# Patient Record
Sex: Female | Born: 1940 | Race: Black or African American | Hispanic: No | Smoking: Never smoker
Health system: Southern US, Community
[De-identification: ages and names within clinical notes are randomized; demographics above are authoritative.]

## PROBLEM LIST (undated history)

## (undated) ENCOUNTER — Emergency Department (HOSPITAL_COMMUNITY): Admission: EM | Discharge status: Absent without leave | Payer: Medicare HMO

## (undated) DIAGNOSIS — A879 Viral meningitis, unspecified: Secondary | ICD-10-CM

## (undated) DIAGNOSIS — K625 Hemorrhage of anus and rectum: Secondary | ICD-10-CM

## (undated) DIAGNOSIS — M1990 Unspecified osteoarthritis, unspecified site: Secondary | ICD-10-CM

## (undated) DIAGNOSIS — E785 Hyperlipidemia, unspecified: Secondary | ICD-10-CM

## (undated) DIAGNOSIS — Z973 Presence of spectacles and contact lenses: Secondary | ICD-10-CM

## (undated) DIAGNOSIS — B004 Herpesviral encephalitis: Secondary | ICD-10-CM

## (undated) DIAGNOSIS — Z780 Asymptomatic menopausal state: Secondary | ICD-10-CM

## (undated) DIAGNOSIS — E669 Obesity, unspecified: Secondary | ICD-10-CM

## (undated) DIAGNOSIS — R5382 Chronic fatigue, unspecified: Secondary | ICD-10-CM

## (undated) DIAGNOSIS — I10 Essential (primary) hypertension: Secondary | ICD-10-CM

## (undated) DIAGNOSIS — N6099 Unspecified benign mammary dysplasia of unspecified breast: Secondary | ICD-10-CM

## (undated) HISTORY — DX: Asymptomatic menopausal state: Z78.0

## (undated) HISTORY — DX: Hemorrhage of anus and rectum: K62.5

## (undated) HISTORY — PX: CYST EXCISION: SHX5701

## (undated) HISTORY — PX: TUBAL LIGATION: SHX77

## (undated) HISTORY — PX: BREAST EXCISIONAL BIOPSY: SUR124

## (undated) HISTORY — DX: Unspecified osteoarthritis, unspecified site: M19.90

## (undated) HISTORY — PX: OTHER SURGICAL HISTORY: SHX169

## (undated) HISTORY — DX: Essential (primary) hypertension: I10

## (undated) HISTORY — PX: APPENDECTOMY: SHX54

## (undated) HISTORY — DX: Unspecified benign mammary dysplasia of unspecified breast: N60.99

## (undated) HISTORY — DX: Hyperlipidemia, unspecified: E78.5

## (undated) HISTORY — DX: Herpesviral encephalitis: B00.4

## (undated) HISTORY — DX: Obesity, unspecified: E66.9

---

## 1990-09-22 HISTORY — PX: ABDOMINAL HYSTERECTOMY: SHX81

## 2001-02-17 ENCOUNTER — Ambulatory Visit (HOSPITAL_COMMUNITY): Admission: RE | Admit: 2001-02-17 | Discharge: 2001-02-17 | Payer: Self-pay | Admitting: General Surgery

## 2001-11-17 ENCOUNTER — Other Ambulatory Visit: Admission: RE | Admit: 2001-11-17 | Discharge: 2001-11-17 | Payer: Self-pay | Admitting: Family Medicine

## 2001-12-01 ENCOUNTER — Ambulatory Visit (HOSPITAL_COMMUNITY): Admission: RE | Admit: 2001-12-01 | Discharge: 2001-12-01 | Payer: Self-pay | Admitting: General Surgery

## 2002-01-13 ENCOUNTER — Encounter: Payer: Self-pay | Admitting: Family Medicine

## 2002-01-13 ENCOUNTER — Ambulatory Visit (HOSPITAL_COMMUNITY): Admission: RE | Admit: 2002-01-13 | Discharge: 2002-01-13 | Payer: Self-pay | Admitting: Family Medicine

## 2002-01-19 ENCOUNTER — Ambulatory Visit (HOSPITAL_COMMUNITY): Admission: RE | Admit: 2002-01-19 | Discharge: 2002-01-19 | Payer: Self-pay | Admitting: Family Medicine

## 2002-01-19 ENCOUNTER — Encounter: Payer: Self-pay | Admitting: Family Medicine

## 2002-03-29 ENCOUNTER — Other Ambulatory Visit: Admission: RE | Admit: 2002-03-29 | Discharge: 2002-03-29 | Payer: Self-pay | Admitting: General Surgery

## 2003-06-12 ENCOUNTER — Encounter: Payer: Self-pay | Admitting: Cardiology

## 2003-06-12 ENCOUNTER — Ambulatory Visit (HOSPITAL_COMMUNITY): Admission: RE | Admit: 2003-06-12 | Discharge: 2003-06-12 | Payer: Self-pay | Admitting: Cardiology

## 2003-09-12 ENCOUNTER — Ambulatory Visit (HOSPITAL_COMMUNITY): Admission: RE | Admit: 2003-09-12 | Discharge: 2003-09-12 | Payer: Self-pay | Admitting: General Surgery

## 2003-09-20 ENCOUNTER — Ambulatory Visit (HOSPITAL_COMMUNITY): Admission: RE | Admit: 2003-09-20 | Discharge: 2003-09-20 | Payer: Self-pay | Admitting: General Surgery

## 2003-09-23 DIAGNOSIS — I10 Essential (primary) hypertension: Secondary | ICD-10-CM

## 2003-09-23 HISTORY — DX: Essential (primary) hypertension: I10

## 2004-05-15 ENCOUNTER — Ambulatory Visit (HOSPITAL_COMMUNITY): Admission: RE | Admit: 2004-05-15 | Discharge: 2004-05-15 | Payer: Self-pay | Admitting: Family Medicine

## 2004-08-06 ENCOUNTER — Ambulatory Visit: Payer: Self-pay | Admitting: Family Medicine

## 2004-08-21 ENCOUNTER — Ambulatory Visit: Payer: Self-pay | Admitting: Family Medicine

## 2004-09-25 ENCOUNTER — Ambulatory Visit (HOSPITAL_COMMUNITY): Admission: RE | Admit: 2004-09-25 | Discharge: 2004-09-25 | Payer: Self-pay | Admitting: Family Medicine

## 2004-11-11 ENCOUNTER — Emergency Department (HOSPITAL_COMMUNITY): Admission: EM | Admit: 2004-11-11 | Discharge: 2004-11-11 | Payer: Self-pay | Admitting: *Deleted

## 2004-12-19 ENCOUNTER — Ambulatory Visit: Payer: Self-pay | Admitting: Family Medicine

## 2005-04-14 ENCOUNTER — Ambulatory Visit: Payer: Self-pay | Admitting: Family Medicine

## 2005-07-02 ENCOUNTER — Ambulatory Visit: Payer: Self-pay | Admitting: Family Medicine

## 2006-02-06 ENCOUNTER — Emergency Department (HOSPITAL_COMMUNITY): Admission: EM | Admit: 2006-02-06 | Discharge: 2006-02-06 | Payer: Self-pay | Admitting: Emergency Medicine

## 2006-04-03 ENCOUNTER — Other Ambulatory Visit: Admission: RE | Admit: 2006-04-03 | Discharge: 2006-04-03 | Payer: Self-pay | Admitting: Family Medicine

## 2006-04-03 ENCOUNTER — Encounter: Payer: Self-pay | Admitting: Family Medicine

## 2006-04-03 ENCOUNTER — Ambulatory Visit: Payer: Self-pay | Admitting: Family Medicine

## 2006-04-10 ENCOUNTER — Ambulatory Visit (HOSPITAL_COMMUNITY): Admission: RE | Admit: 2006-04-10 | Discharge: 2006-04-10 | Payer: Self-pay | Admitting: Family Medicine

## 2006-06-10 ENCOUNTER — Emergency Department (HOSPITAL_COMMUNITY): Admission: EM | Admit: 2006-06-10 | Discharge: 2006-06-10 | Payer: Self-pay | Admitting: Emergency Medicine

## 2006-10-23 ENCOUNTER — Ambulatory Visit: Payer: Self-pay | Admitting: Family Medicine

## 2006-11-02 ENCOUNTER — Ambulatory Visit (HOSPITAL_COMMUNITY): Admission: RE | Admit: 2006-11-02 | Discharge: 2006-11-02 | Payer: Self-pay | Admitting: Family Medicine

## 2006-11-09 ENCOUNTER — Emergency Department (HOSPITAL_COMMUNITY): Admission: EM | Admit: 2006-11-09 | Discharge: 2006-11-09 | Payer: Self-pay | Admitting: Emergency Medicine

## 2006-11-12 ENCOUNTER — Ambulatory Visit (HOSPITAL_COMMUNITY): Admission: RE | Admit: 2006-11-12 | Discharge: 2006-11-12 | Payer: Self-pay | Admitting: Family Medicine

## 2006-11-13 ENCOUNTER — Ambulatory Visit: Payer: Self-pay | Admitting: Family Medicine

## 2006-11-30 ENCOUNTER — Encounter: Admission: RE | Admit: 2006-11-30 | Discharge: 2006-11-30 | Payer: Self-pay | Admitting: Family Medicine

## 2006-12-14 ENCOUNTER — Encounter: Admission: RE | Admit: 2006-12-14 | Discharge: 2006-12-14 | Payer: Self-pay | Admitting: Family Medicine

## 2007-01-01 ENCOUNTER — Encounter: Admission: RE | Admit: 2007-01-01 | Discharge: 2007-01-01 | Payer: Self-pay | Admitting: Family Medicine

## 2007-02-11 ENCOUNTER — Ambulatory Visit: Payer: Self-pay | Admitting: Family Medicine

## 2007-02-19 ENCOUNTER — Ambulatory Visit (HOSPITAL_COMMUNITY): Admission: RE | Admit: 2007-02-19 | Discharge: 2007-02-19 | Payer: Self-pay | Admitting: Family Medicine

## 2007-04-05 ENCOUNTER — Other Ambulatory Visit: Admission: RE | Admit: 2007-04-05 | Discharge: 2007-04-05 | Payer: Self-pay | Admitting: Family Medicine

## 2007-04-05 ENCOUNTER — Encounter: Payer: Self-pay | Admitting: Family Medicine

## 2007-04-05 ENCOUNTER — Ambulatory Visit: Payer: Self-pay | Admitting: Family Medicine

## 2007-04-06 ENCOUNTER — Encounter: Payer: Self-pay | Admitting: Family Medicine

## 2007-04-06 LAB — CONVERTED CEMR LAB
Basophils Absolute: 0 10*3/uL (ref 0.0–0.1)
CO2: 25 meq/L (ref 19–32)
Calcium: 9.8 mg/dL (ref 8.4–10.5)
Chloride: 107 meq/L (ref 96–112)
HDL: 48 mg/dL (ref >39)
LDL Cholesterol: 144 mg/dL — ABNORMAL HIGH (ref 0–99)
Lymphocytes Relative: 40 % (ref 12–46)
Lymphs Abs: 2.2 10*3/uL (ref 0.7–3.3)
Neutro Abs: 2.7 10*3/uL (ref 1.7–7.7)
Neutrophils Relative %: 50 % (ref 43–77)
Platelets: 195 10*3/uL (ref 150–400)
Potassium: 5.1 meq/L (ref 3.5–5.3)
RDW: 13 % (ref 11.5–14.0)
Sodium: 141 meq/L (ref 135–145)
TSH: 1.04 microintl units/mL (ref 0.350–5.50)
Total CHOL/HDL Ratio: 4.4
VLDL: 21 mg/dL (ref 0–40)
WBC: 5.4 10*3/uL (ref 4.0–10.5)

## 2007-04-28 ENCOUNTER — Ambulatory Visit (HOSPITAL_COMMUNITY): Admission: RE | Admit: 2007-04-28 | Discharge: 2007-04-28 | Payer: Self-pay | Admitting: Family Medicine

## 2007-07-02 ENCOUNTER — Ambulatory Visit: Payer: Self-pay | Admitting: Family Medicine

## 2007-10-04 ENCOUNTER — Ambulatory Visit: Payer: Self-pay | Admitting: Family Medicine

## 2007-10-04 LAB — CONVERTED CEMR LAB
ALT: 14 units/L (ref 0–35)
AST: 17 units/L (ref 0–37)
Albumin: 4.3 g/dL (ref 3.5–5.2)
Alkaline Phosphatase: 86 units/L (ref 39–117)
Blood Glucose, Fasting: 99 mg/dL
CO2: 23 meq/L (ref 19–32)
Calcium: 9.6 mg/dL (ref 8.4–10.5)
Cholesterol: 197 mg/dL (ref 0–200)
Creatinine, Ser: 0.9 mg/dL (ref 0.40–1.20)
HDL: 48 mg/dL (ref >39)
Total Bilirubin: 0.4 mg/dL (ref 0.3–1.2)

## 2007-10-12 ENCOUNTER — Encounter: Payer: Self-pay | Admitting: Family Medicine

## 2007-10-12 DIAGNOSIS — I10 Essential (primary) hypertension: Secondary | ICD-10-CM

## 2007-10-12 DIAGNOSIS — N959 Unspecified menopausal and perimenopausal disorder: Secondary | ICD-10-CM | POA: Insufficient documentation

## 2007-10-12 DIAGNOSIS — M479 Spondylosis, unspecified: Secondary | ICD-10-CM | POA: Insufficient documentation

## 2007-10-12 DIAGNOSIS — E785 Hyperlipidemia, unspecified: Secondary | ICD-10-CM | POA: Insufficient documentation

## 2007-10-12 DIAGNOSIS — E669 Obesity, unspecified: Secondary | ICD-10-CM

## 2007-11-09 ENCOUNTER — Inpatient Hospital Stay (HOSPITAL_COMMUNITY): Admission: EM | Admit: 2007-11-09 | Discharge: 2007-11-10 | Payer: Self-pay | Admitting: Emergency Medicine

## 2007-11-17 ENCOUNTER — Ambulatory Visit (HOSPITAL_COMMUNITY): Admission: RE | Admit: 2007-11-17 | Discharge: 2007-11-17 | Payer: Self-pay | Admitting: Family Medicine

## 2007-12-01 ENCOUNTER — Encounter: Admission: RE | Admit: 2007-12-01 | Discharge: 2007-12-01 | Payer: Self-pay | Admitting: Family Medicine

## 2007-12-02 ENCOUNTER — Ambulatory Visit: Payer: Self-pay | Admitting: Family Medicine

## 2008-04-04 ENCOUNTER — Other Ambulatory Visit: Admission: RE | Admit: 2008-04-04 | Discharge: 2008-04-04 | Payer: Self-pay | Admitting: Family Medicine

## 2008-04-04 ENCOUNTER — Ambulatory Visit: Payer: Self-pay | Admitting: Family Medicine

## 2008-04-04 ENCOUNTER — Encounter: Payer: Self-pay | Admitting: Family Medicine

## 2008-04-04 LAB — CONVERTED CEMR LAB: Pap Smear: NORMAL

## 2008-04-05 ENCOUNTER — Encounter: Payer: Self-pay | Admitting: Family Medicine

## 2008-04-05 LAB — CONVERTED CEMR LAB
AST: 17 units/L (ref 0–37)
Albumin: 4.1 g/dL (ref 3.5–5.2)
Alkaline Phosphatase: 84 units/L (ref 39–117)
HDL: 44 mg/dL (ref >39)
LDL Cholesterol: 62 mg/dL (ref 0–99)
Total Protein: 7.3 g/dL (ref 6.0–8.3)
Triglycerides: 80 mg/dL (ref <150)

## 2008-06-05 ENCOUNTER — Encounter: Payer: Self-pay | Admitting: Family Medicine

## 2008-06-05 ENCOUNTER — Ambulatory Visit (HOSPITAL_COMMUNITY): Admission: RE | Admit: 2008-06-05 | Discharge: 2008-06-05 | Payer: Self-pay | Admitting: Family Medicine

## 2008-06-06 ENCOUNTER — Encounter: Payer: Self-pay | Admitting: Family Medicine

## 2008-08-10 ENCOUNTER — Ambulatory Visit: Payer: Self-pay | Admitting: Family Medicine

## 2008-08-10 LAB — CONVERTED CEMR LAB
AST: 18 units/L (ref 0–37)
Albumin: 4.6 g/dL (ref 3.5–5.2)
Alkaline Phosphatase: 91 units/L (ref 39–117)
Calcium: 9.5 mg/dL (ref 8.4–10.5)
Cholesterol: 125 mg/dL (ref 0–200)
Creatinine, Ser: 0.95 mg/dL (ref 0.40–1.20)
HDL: 44 mg/dL (ref >39)
Total Protein: 7.2 g/dL (ref 6.0–8.3)
Triglycerides: 80 mg/dL (ref <150)

## 2008-09-25 ENCOUNTER — Telehealth: Payer: Self-pay | Admitting: Family Medicine

## 2008-10-16 ENCOUNTER — Ambulatory Visit: Payer: Self-pay | Admitting: Family Medicine

## 2009-04-20 ENCOUNTER — Ambulatory Visit: Payer: Self-pay | Admitting: Family Medicine

## 2009-04-20 ENCOUNTER — Encounter: Payer: Self-pay | Admitting: Family Medicine

## 2009-04-20 ENCOUNTER — Other Ambulatory Visit: Admission: RE | Admit: 2009-04-20 | Discharge: 2009-04-20 | Payer: Self-pay | Admitting: Family Medicine

## 2009-04-23 LAB — CONVERTED CEMR LAB
Albumin: 4.3 g/dL (ref 3.5–5.2)
Basophils Relative: 0 % (ref 0–1)
Bilirubin, Direct: 0.1 mg/dL (ref 0.0–0.3)
CO2: 23 meq/L (ref 19–32)
Chloride: 108 meq/L (ref 96–112)
Creatinine, Ser: 0.98 mg/dL (ref 0.40–1.20)
Eosinophils Absolute: 0.1 10*3/uL (ref 0.0–0.7)
Eosinophils Relative: 1 % (ref 0–5)
Glucose, Bld: 106 mg/dL — ABNORMAL HIGH (ref 70–99)
HCT: 40.4 % (ref 36.0–46.0)
HDL: 40 mg/dL (ref >39)
LDL Cholesterol: 59 mg/dL (ref 0–99)
Lymphocytes Relative: 38 % (ref 12–46)
Neutro Abs: 3.1 10*3/uL (ref 1.7–7.7)
Platelets: 187 10*3/uL (ref 150–400)
RDW: 13.7 % (ref 11.5–15.5)
Sodium: 143 meq/L (ref 135–145)
TSH: 0.91 microintl units/mL (ref 0.350–4.500)
Total Bilirubin: 0.5 mg/dL (ref 0.3–1.2)
Total CHOL/HDL Ratio: 3

## 2009-05-21 ENCOUNTER — Telehealth: Payer: Self-pay | Admitting: Family Medicine

## 2009-06-08 ENCOUNTER — Encounter: Payer: Self-pay | Admitting: Family Medicine

## 2009-06-08 ENCOUNTER — Ambulatory Visit (HOSPITAL_COMMUNITY): Admission: RE | Admit: 2009-06-08 | Discharge: 2009-06-08 | Payer: Self-pay | Admitting: Family Medicine

## 2009-06-14 ENCOUNTER — Ambulatory Visit: Payer: Self-pay | Admitting: Family Medicine

## 2009-08-16 ENCOUNTER — Emergency Department (HOSPITAL_COMMUNITY): Admission: EM | Admit: 2009-08-16 | Discharge: 2009-08-16 | Payer: Self-pay | Admitting: Emergency Medicine

## 2009-10-24 ENCOUNTER — Telehealth: Payer: Self-pay | Admitting: Family Medicine

## 2009-11-28 ENCOUNTER — Ambulatory Visit: Payer: Self-pay | Admitting: Family Medicine

## 2009-11-28 DIAGNOSIS — R7301 Impaired fasting glucose: Secondary | ICD-10-CM | POA: Insufficient documentation

## 2009-11-28 DIAGNOSIS — G4700 Insomnia, unspecified: Secondary | ICD-10-CM | POA: Insufficient documentation

## 2009-11-29 LAB — CONVERTED CEMR LAB
Albumin: 4.4 g/dL (ref 3.5–5.2)
Alkaline Phosphatase: 78 units/L (ref 39–117)
CO2: 23 meq/L (ref 19–32)
Chloride: 105 meq/L (ref 96–112)
HDL: 40 mg/dL (ref >39)
Hgb A1c MFr Bld: 7.1 % — ABNORMAL HIGH (ref 4.6–6.1)
LDL Cholesterol: 54 mg/dL (ref 0–99)
Potassium: 4.3 meq/L (ref 3.5–5.3)
Total CHOL/HDL Ratio: 2.8
Total Protein: 7.3 g/dL (ref 6.0–8.3)
Triglycerides: 78 mg/dL (ref <150)
VLDL: 16 mg/dL (ref 0–40)
Vit D, 25-Hydroxy: 30 ng/mL (ref 30–89)

## 2009-12-25 ENCOUNTER — Encounter: Payer: Self-pay | Admitting: Family Medicine

## 2010-01-08 ENCOUNTER — Ambulatory Visit: Payer: Self-pay | Admitting: Family Medicine

## 2010-01-08 DIAGNOSIS — E1169 Type 2 diabetes mellitus with other specified complication: Secondary | ICD-10-CM | POA: Insufficient documentation

## 2010-01-08 DIAGNOSIS — E1121 Type 2 diabetes mellitus with diabetic nephropathy: Secondary | ICD-10-CM | POA: Insufficient documentation

## 2010-01-08 DIAGNOSIS — N63 Unspecified lump in unspecified breast: Secondary | ICD-10-CM

## 2010-01-08 DIAGNOSIS — E1159 Type 2 diabetes mellitus with other circulatory complications: Secondary | ICD-10-CM

## 2010-01-08 DIAGNOSIS — R5381 Other malaise: Secondary | ICD-10-CM

## 2010-01-08 DIAGNOSIS — R5383 Other fatigue: Secondary | ICD-10-CM

## 2010-01-09 ENCOUNTER — Encounter: Payer: Self-pay | Admitting: Family Medicine

## 2010-01-09 ENCOUNTER — Ambulatory Visit (HOSPITAL_COMMUNITY): Admission: RE | Admit: 2010-01-09 | Discharge: 2010-01-09 | Payer: Self-pay | Admitting: Family Medicine

## 2010-01-09 LAB — CONVERTED CEMR LAB: Microalb, Ur: 1.09 mg/dL (ref 0.00–1.89)

## 2010-01-29 ENCOUNTER — Telehealth: Payer: Self-pay | Admitting: Physician Assistant

## 2010-03-12 ENCOUNTER — Encounter: Payer: Self-pay | Admitting: Family Medicine

## 2010-03-13 LAB — CONVERTED CEMR LAB
BUN: 13 mg/dL (ref 6–23)
Creatinine, Ser: 0.9 mg/dL (ref 0.40–1.20)
Glucose, Bld: 108 mg/dL — ABNORMAL HIGH (ref 70–99)
Hgb A1c MFr Bld: 6.6 % — ABNORMAL HIGH (ref <5.7)
Potassium: 4.8 meq/L (ref 3.5–5.3)

## 2010-03-14 ENCOUNTER — Ambulatory Visit: Payer: Self-pay | Admitting: Family Medicine

## 2010-04-30 ENCOUNTER — Ambulatory Visit: Payer: Self-pay | Admitting: Family Medicine

## 2010-06-04 ENCOUNTER — Ambulatory Visit (HOSPITAL_COMMUNITY): Admission: RE | Admit: 2010-06-04 | Discharge: 2010-06-04 | Payer: Self-pay | Admitting: Family Medicine

## 2010-06-04 ENCOUNTER — Ambulatory Visit: Payer: Self-pay | Admitting: Family Medicine

## 2010-06-04 DIAGNOSIS — R1084 Generalized abdominal pain: Secondary | ICD-10-CM

## 2010-06-04 LAB — CONVERTED CEMR LAB
Bilirubin Urine: NEGATIVE
Nitrite: NEGATIVE
pH: 5.5

## 2010-06-06 ENCOUNTER — Telehealth: Payer: Self-pay | Admitting: Family Medicine

## 2010-07-25 ENCOUNTER — Ambulatory Visit (HOSPITAL_COMMUNITY): Admission: RE | Admit: 2010-07-25 | Discharge: 2010-07-25 | Payer: Self-pay | Admitting: Family Medicine

## 2010-08-27 ENCOUNTER — Encounter: Payer: Self-pay | Admitting: Family Medicine

## 2010-09-05 LAB — CONVERTED CEMR LAB
AST: 21 units/L (ref 0–37)
BUN: 12 mg/dL (ref 6–23)
CO2: 26 meq/L (ref 19–32)
Calcium: 9.2 mg/dL (ref 8.4–10.5)
Chloride: 104 meq/L (ref 96–112)
Cholesterol: 166 mg/dL (ref 0–200)
Creatinine, Ser: 0.86 mg/dL (ref 0.40–1.20)
HDL: 42 mg/dL (ref >39)
Total CHOL/HDL Ratio: 4

## 2010-09-09 ENCOUNTER — Ambulatory Visit: Payer: Self-pay | Admitting: Family Medicine

## 2010-09-09 LAB — HM DIABETES FOOT EXAM

## 2010-10-13 ENCOUNTER — Encounter: Payer: Self-pay | Admitting: Family Medicine

## 2010-10-22 NOTE — Assessment & Plan Note (Signed)
Summary: F UP   Vital Signs:  Patient profile:   70 year old female Menstrual status:  hysterectomy Height:      64 inches Weight:      197.25 pounds BMI:     33.98 O2 Sat:      97 % Pulse rate:   72 / minute Pulse rhythm:   regular Resp:     16 per minute BP sitting:   112 / 78  (left arm) Cuff size:   large  Vitals Entered By: Everitt Amber LPN (March 14, 2010 10:05 AM)  Nutrition Counseling: Patient's BMI is greater than 25 and therefore counseled on weight management options. CC: Follow up chronic problems   CC:  Follow up chronic problems.  History of Present Illness: Reports  that she has been  doing well. Denies recent fever or chills. Denies sinus pressure, nasal congestion , ear pain or sore throat. Denies chest congestion, or cough productive of sputum. Denies chest pain, palpitations, PND, orthopnea or leg swelling. Denies abdominal pain, nausea, vomitting, diarrhea or constipation. Denies change in bowel movements or bloody stool. Denies dysuria , frequency, incontinence or hesitancy. Denies  joint pain, swelling, or reduced mobility. Denies headaches, vertigo, seizures. Denies depression, anxiety or insomnia. Denies  rash, lesions, or itch.    .hpi Current Medications (verified): 1)  Catapres 0.1 Mg  Tabs (Clonidine Hcl) .... Take 1 Tab By Mouth At Bedtime 2)  Crestor 10 Mg Tabs (Rosuvastatin Calcium) .... One Tab By Mouth Qhs 3)  Oscal 500/200 D-3 500-200 Mg-Unit Tabs (Calcium-Vitamin D) .... Take 1 Tablet By Mouth Once A Day 4)  Ecotrin Low Strength 81 Mg Tbec (Aspirin) .... Take 1 Tablet By Mouth Once A Day 5)  Restoril 15 Mg Caps (Temazepam) .... One To Two Cpsules At Bedtime 6)  Metformin Hcl 500 Mg Tabs (Metformin Hcl) .... Take 1 Tablet By Mouth Two Times A Day  Allergies (verified): 1)  ! Darvocet  Review of Systems      See HPI Eyes:  Denies blurring and discharge. Endo:  Denies cold intolerance, excessive hunger, excessive thirst, excessive  urination, heat intolerance, polyuria, and weight change; fasting blood sugars are being tested regularly abnd are less than 120. Heme:  Denies abnormal bruising and bleeding. Allergy:  Denies hives or rash and itching eyes.  Physical Exam  General:  Well-developed,well-nourished,in no acute distress; alert,appropriate and cooperative throughout examination HEENT: No facial asymmetry,  EOMI, No sinus tenderness, TM's Clear, oropharynx  pink and moist.   Chest: Clear to auscultation bilaterally.  CVS: S1, S2, No murmurs, No S3.   Abd: Soft, Nontender.  MS: Adequate ROM spine, hips, shoulders and knees.  Ext: No edema.   CNS: CN 2-12 intact, power tone and sensation normal throughout.   Skin: Intact, no visible lesions or rashes.  Psych: Good eye contact, normal affect.  Memory intact, not anxious or depressed appearing.    Impression & Recommendations:  Problem # 1:  DIABETES MELLITUS, TYPE II (ICD-250.00) Assessment Improved  Her updated medication list for this problem includes:    Ecotrin Low Strength 81 Mg Tbec (Aspirin) .Marland Kitchen... Take 1 tablet by mouth once a day    Metformin Hcl 500 Mg Tabs (Metformin hcl) .Marland Kitchen... Take 1 tablet by mouth two times a day  Labs Reviewed: Creat: 0.90 (03/12/2010)    Reviewed HgBA1c results: 6.6 (03/12/2010)  7.1 (11/28/2009)  Problem # 2:  ESSENTIAL HYPERTENSION, BENIGN (ICD-401.1) Assessment: Improved  Her updated medication list for this problem  includes:    Catapres 0.1 Mg Tabs (Clonidine hcl) .Marland Kitchen... Take 1 tab by mouth at bedtime  Orders: T-Basic Metabolic Panel [id number])  BP today: 112/78 Prior BP: 118/84 (01/08/2010)  Labs Reviewed: K+: 4.8 (03/12/2010) Creat: : 0.90 (03/12/2010)   Chol: 110 (11/28/2009)   HDL: 40 (11/28/2009)   LDL: 54 (11/28/2009)   TG: 78 (11/28/2009)  Problem # 3:  OBESITY, MILD (ICD-278.02) Assessment: Unchanged  Ht: 64 (03/14/2010)   Wt: 197.25 (03/14/2010)   BMI: 33.98 (03/14/2010)  Problem # 4:   DYSLIPIDEMIA (ICD-272.4) Assessment: Comment Only  Her updated medication list for this problem includes:    Crestor 10 Mg Tabs (Rosuvastatin calcium) ..... One tab by mouth qhs  Labs Reviewed: SGOT: 18 (11/28/2009)   SGPT: 15 (11/28/2009)   HDL:40 (11/28/2009), 40 (04/20/2009)  LDL:54 (11/28/2009), 59 (04/20/2009)  Chol:110 (11/28/2009), 120 (04/20/2009)  Trig:78 (11/28/2009), 103 (04/20/2009)  Complete Medication List: 1)  Catapres 0.1 Mg Tabs (Clonidine hcl) .... Take 1 tab by mouth at bedtime 2)  Crestor 10 Mg Tabs (Rosuvastatin calcium) .... One tab by mouth qhs 3)  Oscal 500/200 D-3 500-200 Mg-unit Tabs (Calcium-vitamin d) .... Take 1 tablet by mouth once a day 4)  Ecotrin Low Strength 81 Mg Tbec (Aspirin) .... Take 1 tablet by mouth once a day 5)  Restoril 15 Mg Caps (Temazepam) .... One to two cpsules at bedtime 6)  Metformin Hcl 500 Mg Tabs (Metformin hcl) .... Take 1 tablet by mouth two times a day  Other Orders: T-Hepatic Function [id number]) T-Lipid Profile [id number]) T- Hemoglobin A1C ([id number])  Patient Instructions: 1)  Please schedule a follow-up appointment in 3.5 months. 2)  It is important that you exercise regularly at least 30 minutes 5 times a week. If you develop chest pain, have severe difficulty breathing, or feel very tired , stop exercising immediately and seek medical attention. 3)  You need to lose weight. Consider a lower calorie diet and regular exercise. 4)   Goal is 5 pounds 5)  Your blood sugar is much better, keep it up 6)  No med changes  7)  BMP prior to visit, ICD-9: 8)  Hepatic Panel prior to visit, ICD-9: 9)  Lipid Panel prior to visit, ICD-9:   fasting in  3.5 months 10)  HbgA1C prior to visit, ICD-9:

## 2010-10-22 NOTE — Assessment & Plan Note (Signed)
Summary: office visit   Vital Signs:  Patient profile:   70 year old female Menstrual status:  hysterectomy Height:      64 inches Weight:      190.75 pounds O2 Sat:      98 % on Room air Pulse rate:   70 / minute BP sitting:   120 / 78  (left arm) CC: stomach has been hurting x2 weeks, and right knee has been aching a little mainly when she sits down, room 3 Is Patient Diabetic? Yes Did you bring your meter with you today? No   CC:  stomach has been hurting x2 weeks, and right knee has been aching a little mainly when she sits down, and room 3.  History of Present Illness: Pt c/o mid abd pain x 2 weeks.  No worsening.  "Hurts all night"  and intermittent sharp pains.  Denies cramps.  Nausea this am.  No vomiting.  Pt states has soft BM, but when stomach hurts goes a little bit.  Feels that she had a good BM 4 days ago.  Appetitie normal, but stomach hurts more after eating.  Pt states she was having some discomfort in her Rt knee when she stood up after prolonged sitting but this is no longer bothering her.  She denies pain with walking.  No edema.  No injury.  Allergies: 1)  ! Darvocet  Past History:  Past medical history reviewed for relevance to current acute and chronic problems.  Past Medical History: Reviewed history from 10/12/2007 and no changes required. dyslipidemia Stage 1 Hypertension Mild Obesity Uncontrolled Postmenopausal sx's DJD of neck  Review of Systems General:  Denies chills, fever, and loss of appetite. CV:  Denies chest pain or discomfort. Resp:  Denies shortness of breath. GI:  Complains of abdominal pain and nausea; denies bloody stools, constipation, dark tarry stools, diarrhea, indigestion, loss of appetite, and vomiting. GU:  Denies dysuria and urinary frequency.  Physical Exam  General:  Well-developed,well-nourished,in no acute distress; alert,appropriate and cooperative throughout examination Head:  Normocephalic and atraumatic without  obvious abnormalities. No apparent alopecia or balding. Ears:  External ear exam shows no significant lesions or deformities.  Otoscopic examination reveals clear canals, tympanic membranes are intact bilaterally without bulging, retraction, inflammation or discharge. Hearing is grossly normal bilaterally. Nose:  External nasal examination shows no deformity or inflammation. Nasal mucosa are pink and moist without lesions or exudates. Mouth:  Oral mucosa and oropharynx without lesions or exudates. Neck:  No deformities, masses, or tenderness noted. Lungs:  Normal respiratory effort, chest expands symmetrically. Lungs are clear to auscultation, no crackles or wheezes. Heart:  Normal rate and regular rhythm. S1 and S2 normal without gallop, murmur, click, rub or other extra sounds. Abdomen:  soft, normal bowel sounds, no distention, no masses, no hepatomegaly, and no splenomegaly.   Pt reports TTP Lt mid and Lower abd, no guarding rebound or rigidity. Stool palpable in descending and ascending colon. Extremities:  No clubbing, cyanosis, edema, or deformity noted with normal full range of motion of all joints.  Pt stood up and sat down repeatedly in her chair to show me that her knee no longer hurts. Neurologic:  gait normal.   Cervical Nodes:  No lymphadenopathy noted Psych:  Cognition and judgment appear intact. Alert and cooperative with normal attention span and concentration. No apparent delusions, illusions, hallucinations   Impression & Recommendations:  Problem # 1:  ABDOMINAL PAIN, GENERALIZED (ICD-789.07) Assessment New Suspect constipation.  Orders: KUB (  KUB) UA Dipstick w/o Micro (automated)  ([id number])  Complete Medication List: 1)  Catapres 0.1 Mg Tabs (Clonidine hcl) .... Take 1 tab by mouth at bedtime 2)  Crestor 10 Mg Tabs (Rosuvastatin calcium) .... One tab by mouth qhs 3)  Oscal 500/200 D-3 500-200 Mg-unit Tabs (Calcium-vitamin d) .... Take 1 tablet by mouth once a day 4)   Ecotrin Low Strength 81 Mg Tbec (Aspirin) .... Take 1 tablet by mouth once a day 5)  Restoril 15 Mg Caps (Temazepam) .... One to two cpsules at bedtime 6)  Metformin Hcl 500 Mg Tabs (Metformin hcl) .... Take 1 tablet by mouth two times a day  Other Orders: Influenza Vaccine NON MCR ([id number])  Patient Instructions: 1)  Keep your next appt with Dr Lodema Hong. 2)  Have your xray done today. 3)  I believe your abdominal pain is due to constipation.  If this is confirmed on xray I want you to take Miralax once daily, or use Senekot once daily.  You can purchase either of these over the counter.  Follow the directions on the package and discontinue use once your bowel movements have improved.  Laboratory Results   Urine Tests  Date/Time Received: 9.13.11/ 9:31 AM  Routine Urinalysis   Color: yellow Appearance: Clear Glucose: negative   (Normal Range: Negative) Bilirubin: negative   (Normal Range: Negative) Ketone: negative   (Normal Range: Negative) Spec. Gravity: >=1.030   (Normal Range: 1.003-1.035) Blood: trace-intact   (Normal Range: Negative) pH: 5.5   (Normal Range: 5.0-8.0) Protein: negative   (Normal Range: Negative) Urobilinogen: 0.2   (Normal Range: 0-1) Nitrite: negative   (Normal Range: Negative) Leukocyte Esterace: negative   (Normal Range: Negative)         Influenza Vaccine    Vaccine Type: Fluvax Non-MCR    Site: left deltoid    Mfr: novartis    Dose: 0.5 ml    Route: IM    Given by: Adella Hare LPN    Exp. Date: 01/2011    Lot #: [id number]    VIS given: 04/16/10 version given June 04, 2010.

## 2010-10-22 NOTE — Miscellaneous (Signed)
Summary: Home Care Report  Home Care Report   Imported By: Lind Guest 12/25/2009 16:10:17  _____________________________________________________________________  External Attachment:    Type:   Image     Comment:   External Document

## 2010-10-22 NOTE — Progress Notes (Signed)
Summary: medicine  Phone Note Call from Patient   Summary of Call: the metformin is making her woozy  drained no energy  call back at [phone] Initial call taken by: Lind Guest,  Jan 29, 2010 9:54 AM  Follow-up for Phone Call        Tell pt to try decreasing to once a day with food, instead of two times a day. Is she checking her blood sugar to make sure she's not getting low? Appt if this does not help. Follow-up by: Esperanza Sheets PA,  Jan 29, 2010 10:28 AM  Additional Follow-up for Phone Call Additional follow up Details #1::        patient aware Additional Follow-up by: Adella Hare LPN,  Jan 29, 2010 10:45 AM

## 2010-10-22 NOTE — Assessment & Plan Note (Signed)
Summary: f up   Vital Signs:  Patient profile:   70 year old female Menstrual status:  hysterectomy Height:      64 inches Weight:      191.25 pounds BMI:     32.95 O2 Sat:      99 % Pulse rate:   70 / minute Pulse rhythm:   regular Resp:     16 per minute BP sitting:   122 / 80  (left arm) Cuff size:   large  Vitals Entered By: Everitt Amber LPN (April 30, 2010 10:52 AM)  Nutrition Counseling: Patient's BMI is greater than 25 and therefore counseled on weight management options. CC: Follow up chronic problems   CC:  Follow up chronic problems.  History of Present Illness: Reports  that she has been doing well. She has been exercising regularly, and is somewhat disappointed with regard to her weight loss. She tess her sugars daily, and the fsting sugars are seldom over 120. Denies recent fever or chills. Denies sinus pressure, nasal congestion , ear pain or sore throat. Denies chest congestion, or cough productive of sputum. Denies chest pain, palpitations, PND, orthopnea or leg swelling. Denies abdominal pain, nausea, vomitting, diarrhea or constipation. Denies change in bowel movements or bloody stool. Denies dysuria , frequency, incontinence or hesitancy. Denies  joint pain, swelling, or reduced mobility. Denies headaches, vertigo, seizures. Denies depression, anxiety or insomnia. Denies  rash, lesions, or itch.     Current Medications (verified): 1)  Catapres 0.1 Mg  Tabs (Clonidine Hcl) .... Take 1 Tab By Mouth At Bedtime 2)  Crestor 10 Mg Tabs (Rosuvastatin Calcium) .... One Tab By Mouth Qhs 3)  Oscal 500/200 D-3 500-200 Mg-Unit Tabs (Calcium-Vitamin D) .... Take 1 Tablet By Mouth Once A Day 4)  Ecotrin Low Strength 81 Mg Tbec (Aspirin) .... Take 1 Tablet By Mouth Once A Day 5)  Restoril 15 Mg Caps (Temazepam) .... One To Two Cpsules At Bedtime 6)  Metformin Hcl 500 Mg Tabs (Metformin Hcl) .... Take 1 Tablet By Mouth Two Times A Day  Allergies (verified): 1)   ! Darvocet  Review of Systems      See HPI General:  Complains of fatigue. Eyes:  Denies blurring and discharge. Endo:  Denies cold intolerance, excessive hunger, excessive thirst, excessive urination, heat intolerance, polyuria, and weight change. Heme:  Denies abnormal bruising and bleeding. Allergy:  Complains of seasonal allergies.  Physical Exam  General:  Well-developed,well-nourished,in no acute distress; alert,appropriate and cooperative throughout examination HEENT: No facial asymmetry,  EOMI, No sinus tenderness, TM's Clear, oropharynx  pink and moist.   Chest: Clear to auscultation bilaterally.  CVS: S1, S2, No murmurs, No S3.   Abd: Soft, Nontender.  MS: Adequate ROM spine, hips, shoulders and knees.  Ext: No edema.   CNS: CN 2-12 intact, power tone and sensation normal throughout.   Skin: Intact, no visible lesions or rashes.  Psych: Good eye contact, normal affect.  Memory intact, not anxious or depressed appearing.   Diabetes Management Exam:    Foot Exam (with socks and/or shoes not present):       Sensory-Monofilament:          Left foot: normal          Right foot: normal       Inspection:          Left foot: normal          Right foot: normal  Nails:          Left foot: normal          Right foot: normal   Impression & Recommendations:  Problem # 1:  DIABETES MELLITUS, TYPE II (ICD-250.00) Assessment Improved  Her updated medication list for this problem includes:    Ecotrin Low Strength 81 Mg Tbec (Aspirin) .Marland Kitchen... Take 1 tablet by mouth once a day    Metformin Hcl 500 Mg Tabs (Metformin hcl) .Marland Kitchen... Take 1 tablet by mouth two times a day  Labs Reviewed: Creat: 0.90 (03/12/2010)    Reviewed HgBA1c results: 6.6 (03/12/2010)  7.1 (11/28/2009)  Problem # 2:  ESSENTIAL HYPERTENSION, BENIGN (ICD-401.1) Assessment: Unchanged  Her updated medication list for this problem includes:    Catapres 0.1 Mg Tabs (Clonidine hcl) .Marland Kitchen... Take 1 tab by mouth  at bedtime, needs to start an ACE  BP today: 122/80 Prior BP: 112/78 (03/14/2010)  Labs Reviewed: K+: 4.8 (03/12/2010) Creat: : 0.90 (03/12/2010)   Chol: 110 (11/28/2009)   HDL: 40 (11/28/2009)   LDL: 54 (11/28/2009)   TG: 78 (11/28/2009)  Problem # 3:  DYSLIPIDEMIA (ICD-272.4) Assessment: Comment Only  Her updated medication list for this problem includes:    Crestor 10 Mg Tabs (Rosuvastatin calcium) ..... One tab by mouth qhs  Orders: T-Hepatic Function [id number]) T-Lipid Profile [id number])  Labs Reviewed: SGOT: 18 (11/28/2009)   SGPT: 15 (11/28/2009)   HDL:40 (11/28/2009), 40 (04/20/2009)  LDL:54 (11/28/2009), 59 (04/20/2009)  Chol:110 (11/28/2009), 120 (04/20/2009)  Trig:78 (11/28/2009), 103 (04/20/2009)  Complete Medication List: 1)  Catapres 0.1 Mg Tabs (Clonidine hcl) .... Take 1 tab by mouth at bedtime 2)  Crestor 10 Mg Tabs (Rosuvastatin calcium) .... One tab by mouth qhs 3)  Oscal 500/200 D-3 500-200 Mg-unit Tabs (Calcium-vitamin d) .... Take 1 tablet by mouth once a day 4)  Ecotrin Low Strength 81 Mg Tbec (Aspirin) .... Take 1 tablet by mouth once a day 5)  Restoril 15 Mg Caps (Temazepam) .... One to two cpsules at bedtime 6)  Metformin Hcl 500 Mg Tabs (Metformin hcl) .... Take 1 tablet by mouth two times a day  Other Orders: T-Basic Metabolic Panel [id number]) T-CBC w/Diff [id number]) T-TSH [id number]) T- Hemoglobin A1C ([id number]) Podiatry Referral (Podiatry)  Patient Instructions: 1)  Please schedule a follow-up appointment in 3.5 months. 2)  BMP prior to visit, ICD-9: 3)  Hepatic Panel prior to visit, ICD-9: 4)  Lipid Panel prior to visit, ICD-9: 5)  TSH prior to visit, ICD-9:   fasting mid September 6)  CBC w/ Diff prior to visit, ICD-9: 7)  HbgA1C prior to visit, ICD-9: 8)  It is important that you exercise regularly at least 20 minutes 5 times a week. If you develop chest pain, have severe difficulty breathing, or feel very tired , stop  exercising immediately and seek medical attention. 9)  You need to lose weight. Consider a lower calorie diet and regular exercise. pls take both metformin tabs together  Prescriptions: RESTORIL 15 MG CAPS (TEMAZEPAM) one to two cpsules at bedtime  #60 x 3   Entered by:   Everitt Amber LPN   Authorized by:   Syliva Overman MD   Signed by:   Everitt Amber LPN on 59/56/3875   Method used:   Printed then faxed to ...       [facility] [address] [phone]* (retail)       [address]       [place], [place]  [address]  Ph: [phone]       Fax: [phone]   RxID:   [id number]

## 2010-10-22 NOTE — Progress Notes (Signed)
Summary: CRESTOR  Phone Note Call from Patient   Summary of Call: CANCELLED HER APPT FOR TODAY BECAUSE ALL SHE NEEDS IS HER CRESTOR FILLED AT [facility] Initial call taken by: Lind Guest,  October 24, 2009 9:17 AM  Follow-up for Phone Call        advised patient she is due an ov.  will only refill med for 30 days and will not  be able to refill again until ov Follow-up by: Worthy Keeler LPN,  October 24, 2009 10:11 AM    Prescriptions: CRESTOR 10 MG TABS (ROSUVASTATIN CALCIUM) ONE TAB by mouth QHS  #30 x 0   Entered by:   Worthy Keeler LPN   Authorized by:   Syliva Overman MD   Signed by:   Worthy Keeler LPN on 84/13/2440   Method used:   Electronically to        [facility] [address] [phone]* (retail)       [address], [place]  [address]       Ph: [phone]       Fax: [phone]   RxID:   [id number]

## 2010-10-22 NOTE — Progress Notes (Signed)
Summary: nurse call  Phone Note Call from Patient   Summary of Call: pt taking mirlax and she hasn't been to bathroom in 3days. please give her a call [phone] Initial call taken by: Rudene Anda,  June 06, 2010 8:33 AM  Follow-up for Phone Call        was here tuesday and she was told she was constipated and she still hasn't went. Advised her to try the senokot that Dawn recommended and call back if no results Follow-up by: Everitt Amber LPN,  June 06, 2010 9:12 AM

## 2010-10-22 NOTE — Assessment & Plan Note (Signed)
Summary: OV   Vital Signs:  Patient profile:   70 year old female Menstrual status:  hysterectomy Height:      64 inches Weight:      201 pounds BMI:     34.63 O2 Sat:      94 % Pulse rate:   78 / minute Pulse rhythm:   regular Resp:     16 per minute BP sitting:   124 / 86  (left arm) Cuff size:   large  Vitals Entered By: Everitt Amber LPN (November 29, 4694 9:28 AM)  Nutrition Counseling: Patient's BMI is greater than 25 and therefore counseled on weight management options. CC: Follow up visit, Abdominal Pain   CC:  Follow up visit and Abdominal Pain.  History of Present Illness: Reports  that she has been  doing well. Denies recent fever or chills. Denies sinus pressure, nasal congestion , ear pain or sore throat. Denies chest congestion, or cough productive of sputum. Denies chest pain, palpitations, PND, orthopnea or leg swelling. Denies abdominal pain, nausea, vomitting, diarrhea or constipation. Denies change in bowel movements or bloody stool. Denies dysuria , frequency, incontinence or hesitancy. Denies  joint pain, swelling, or reduced mobility. Denies headaches, vertigo, seizures. Denies depression, anxiety or insomnia. Denies  rash, lesions, or itch.     Current Medications (verified): 1)  Catapres 0.1 Mg  Tabs (Clonidine Hcl) .... Take 1 Tab By Mouth At Bedtime 2)  Crestor 10 Mg Tabs (Rosuvastatin Calcium) .... One Tab By Mouth Qhs 3)  Oscal 500/200 D-3 500-200 Mg-Unit Tabs (Calcium-Vitamin D) .... Take 1 Tablet By Mouth Once A Day 4)  Ecotrin Low Strength 81 Mg Tbec (Aspirin) .... Take 1 Tablet By Mouth Once A Day  Allergies (verified): 1)  ! Darvocet  Review of Systems      See HPI Eyes:  Denies blurring and discharge. Endo:  Denies cold intolerance, excessive hunger, excessive thirst, excessive urination, heat intolerance, polyuria, and weight change. Heme:  Denies abnormal bruising and bleeding. Allergy:  Denies hives or rash and itching  eyes.  Physical Exam  General:  Well-developed,well-nourished,in no acute distress; alert,appropriate and cooperative throughout examination HEENT: No facial asymmetry,  EOMI, No sinus tenderness, TM's Clear, oropharynx  pink and moist.   Chest: Clear to auscultation bilaterally.  CVS: S1, S2, No murmurs, No S3.   Abd: Soft, Nontender.  MS: Adequate ROM spine, hips, shoulders and knees.  Ext: No edema.   CNS: CN 2-12 intact, power tone and sensation normal throughout.   Skin: Intact, no visible lesions or rashes.  Psych: Good eye contact, normal affect.  Memory intact, not anxious or depressed appearing.    Impression & Recommendations:  Problem # 1:  IMPAIRED FASTING GLUCOSE (ICD-790.21) Assessment Comment Only  Her updated medication list for this problem includes:    Metformin Hcl 500 Mg Tabs (Metformin hcl) .Marland Kitchen... Take 1 tablet by mouth two times a day  Orders: T- Hemoglobin A1C ([id number])  Problem # 2:  INSOMNIA, CHRONIC (ICD-307.42) Assessment: Unchanged pt reports insomnia primarily due to night sweaTS  Problem # 3:  OBESITY, MILD (ICD-278.02) Assessment: Unchanged  Ht: 64 (11/28/2009)   Wt: 201 (11/28/2009)   BMI: 34.63 (11/28/2009)  Problem # 4:  DYSLIPIDEMIA (ICD-272.4) Assessment: Comment Only  Her updated medication list for this problem includes:    Crestor 10 Mg Tabs (Rosuvastatin calcium) ..... One tab by mouth qhs  Orders: T-Hepatic Function [id number]) T-Lipid Profile [id number])  Labs Reviewed: SGOT: 18 (04/20/2009)  SGPT: 16 (04/20/2009)   HDL:40 (04/20/2009), 44 (08/10/2008)  LDL:59 (04/20/2009), 65 (08/10/2008)  Chol:120 (04/20/2009), 125 (08/10/2008)  Trig:103 (04/20/2009), 80 (08/10/2008)  Complete Medication List: 1)  Catapres 0.1 Mg Tabs (Clonidine hcl) .... Take 1 tab by mouth at bedtime 2)  Crestor 10 Mg Tabs (Rosuvastatin calcium) .... One tab by mouth qhs 3)  Oscal 500/200 D-3 500-200 Mg-unit Tabs (Calcium-vitamin d) .... Take  1 tablet by mouth once a day 4)  Ecotrin Low Strength 81 Mg Tbec (Aspirin) .... Take 1 tablet by mouth once a day 5)  Restoril 15 Mg Caps (Temazepam) .... One to two cpsules at bedtime 6)  Metformin Hcl 500 Mg Tabs (Metformin hcl) .... Take 1 tablet by mouth two times a day  Other Orders: T-Basic Metabolic Panel [phone]) T-Vitamin D (25-Hydroxy) [id number])   Patient Instructions: 1)  F/u in 5 months. 2)  BMP prior to visit, ICD-9: 3)  Hepatic Panel prior to visit, ICD-9: 4)  Lipid Panel prior to visit, ICD-9: 5)  HbgA1C prior to visit, ICD-9: 6)  Vit D level 7)  It is important that you exercise regularly at least 20 minutes 5 times a week. If you develop chest pain, have severe difficulty breathing, or feel very tired , stop exercising immediately and seek medical attention. 8)  You need to lose weight. Consider a lower calorie diet and regular exercise.  9)  New med for sleep pls start tonight, pls practic e good sleep hygiene Prescriptions: METFORMIN HCL 500 MG TABS (METFORMIN HCL) Take 1 tablet by mouth two times a day  #60 x 4   Entered and Authorized by:   Syliva Overman MD   Signed by:   Syliva Overman MD on 11/29/2009   Method used:   Electronically to        [facility] [address] [phone]* (retail)       [address] [place]  [address]       Ph: [phone]       Fax: [phone]   RxID:   [id number] CRESTOR 10 MG TABS (ROSUVASTATIN CALCIUM) ONE TAB by mouth QHS  #90 x 5   Entered by:   Everitt Amber LPN   Authorized by:   Syliva Overman MD   Signed by:   Everitt Amber LPN on 59/56/3875   Method used:   Electronically to        [facility] [address] [phone]* (retail)       [address] [place], [place]  [address]       Ph: [phone]       Fax: [phone]   RxID:   [id number] CATAPRES 0.1 MG  TABS (CLONIDINE HCL) Take 1 tab by mouth at bedtime  #90 x 5   Entered by:   Everitt Amber LPN   Authorized by:   Syliva Overman MD    Signed by:   Everitt Amber LPN on 83/15/1761   Method used:   Electronically to        [facility] [address] [phone]* (retail)       [address] [place], [place]  [address]       Ph: [phone]       Fax: [phone]   RxID:   [id number] RESTORIL 15 MG CAPS (TEMAZEPAM) one to two cpsules at bedtime  #60 x 3   Entered by:   Merry Proud  Hudy LPN   Authorized by:   Syliva Overman MD   Signed by:   Everitt Amber LPN on 16/06/9603   Method used:   Printed then faxed to ...       [facility] [address] [phone]* (retail)       [address] [place]  [address]       Ph: [phone]       Fax: [phone]   RxID:   [id number]

## 2010-10-22 NOTE — Assessment & Plan Note (Signed)
Summary: OV   Vital Signs:  Patient profile:   70 year old female Menstrual status:  hysterectomy Height:      64 inches Weight:      195.31 pounds BMI:     33.65 O2 Sat:      98 % Pulse rate:   80 / minute Pulse rhythm:   regular Resp:     16 per minute BP sitting:   118 / 84  (left arm) Cuff size:   large  Vitals Entered By: Everitt Amber LPN (January 08, 2010 8:24 AM)  Nutrition Counseling: Patient's BMI is greater than 25 and therefore counseled on weight management options. CC: Wants to get right breast checked    CC:  Wants to get right breast checked .  History of Present Illness: right breast lump at 5 o'clock  x 6 months, Aunts with breast cancer. Reports  that she has otherwise been doing well. Denies recent fever or chills. Denies sinus pressure, nasal congestion , ear pain or sore throat. Denies chest congestion, or cough productive of sputum. Denies chest pain, palpitations, PND, orthopnea or leg swelling. Denies abdominal pain, nausea, vomitting, diarrhea or constipation. Denies change in bowel movements or bloody stool. Denies dysuria , frequency, incontinence or hesitancy. Denies  joint pain, swelling, or reduced mobility. Denies headaches, vertigo, seizures. Denies depression, anxiety or insomnia. Denies  rash, lesions, or itch. she has been doing well with her new dx of diabetes, tesrting once daily nand blood sugars are averaging less than 120 fasting, she has also lost weight.     Current Medications (verified): 1)  Catapres 0.1 Mg  Tabs (Clonidine Hcl) .... Take 1 Tab By Mouth At Bedtime 2)  Crestor 10 Mg Tabs (Rosuvastatin Calcium) .... One Tab By Mouth Qhs 3)  Oscal 500/200 D-3 500-200 Mg-Unit Tabs (Calcium-Vitamin D) .... Take 1 Tablet By Mouth Once A Day 4)  Ecotrin Low Strength 81 Mg Tbec (Aspirin) .... Take 1 Tablet By Mouth Once A Day 5)  Restoril 15 Mg Caps (Temazepam) .... One To Two Cpsules At Bedtime 6)  Metformin Hcl 500 Mg Tabs (Metformin  Hcl) .... Take 1 Tablet By Mouth Two Times A Day  Allergies (verified): 1)  ! Darvocet  Review of Systems      See HPI Eyes:  Denies blurring, discharge, and red eye. Endo:  Denies cold intolerance, excessive hunger, excessive thirst, excessive urination, heat intolerance, polyuria, and weight change; tests once daily, has log, and fasting avg aroun 115. Heme:  Denies abnormal bruising and bleeding. Allergy:  Complains of seasonal allergies; denies itching eyes; mild.  Physical Exam  General:  Well-developed,well-nourished,in no acute distress; alert,appropriate and cooperative throughout examination HEENT: No facial asymmetry,  EOMI, No sinus tenderness, TM's Clear, oropharynx  pink and moist.   Chest: Clear to auscultation bilaterally.  CVS: S1, S2, No murmurs, No S3.   Abd: Soft, Nontender.  MS: Adequate ROM spine, hips, shoulders and knees.  Ext: No edema.   CNS: CN 2-12 intact, power tone and sensation normal throughout.   Skin: Intact, no visible lesions or rashes.  Psych: Good eye contact, normal affect.  Memory intact, not anxious or depressed appearing. Breast: right lump at 5 o'clock, tender, mobile  Diabetes Management Exam:    Foot Exam (with socks and/or shoes not present):       Sensory-Monofilament:          Left foot: normal          Right foot: normal  Inspection:          Left foot: normal          Right foot: normal       Nails:          Left foot: normal          Right foot: normal   Impression & Recommendations:  Problem # 1:  BREAST MASS, RIGHT (ICD-611.72) Assessment Comment Only  Orders: Radiology Referral (Radiology)  Problem # 2:  DIABETES MELLITUS, TYPE II (ICD-250.00) Assessment: Improved  Orders: T-Basic Metabolic Panel [id number])  The following medications were removed from the medication list:    Metformin Hcl 500 Mg Tabs (Metformin hcl) .Marland Kitchen... Take 1 tablet by mouth two times a day Her updated medication list for this  problem includes:    Ecotrin Low Strength 81 Mg Tbec (Aspirin) .Marland Kitchen... Take 1 tablet by mouth once a day    Metformin Hcl 500 Mg Tabs (Metformin hcl) .Marland Kitchen... Take 1 tablet by mouth two times a day  Labs Reviewed: Creat: 0.94 (11/28/2009)    Reviewed HgBA1c results: 7.1 (11/28/2009)  Problem # 3:  OBESITY, MILD (ICD-278.02) Assessment: Improved  Ht: 64 (01/08/2010)   Wt: 195.31 (01/08/2010)   BMI: 33.65 (01/08/2010)  Problem # 4:  ESSENTIAL HYPERTENSION, BENIGN (ICD-401.1) Assessment: Improved  Her updated medication list for this problem includes:    Catapres 0.1 Mg Tabs (Clonidine hcl) .Marland Kitchen... Take 1 tab by mouth at bedtime, will needan ace  BP today: 118/84 Prior BP: 124/86 (11/28/2009)  Labs Reviewed: K+: 4.3 (11/28/2009) Creat: : 0.94 (11/28/2009)   Chol: 110 (11/28/2009)   HDL: 40 (11/28/2009)   LDL: 54 (11/28/2009)   TG: 78 (11/28/2009)  Problem # 5:  DYSLIPIDEMIA (ICD-272.4) Assessment: Unchanged  Her updated medication list for this problem includes:    Crestor 10 Mg Tabs (Rosuvastatin calcium) ..... One tab by mouth qhs  Labs Reviewed: SGOT: 18 (11/28/2009)   SGPT: 15 (11/28/2009)   HDL:40 (11/28/2009), 40 (04/20/2009)  LDL:54 (11/28/2009), 59 (04/20/2009)  Chol:110 (11/28/2009), 120 (04/20/2009)  Trig:78 (11/28/2009), 103 (04/20/2009)  Complete Medication List: 1)  Catapres 0.1 Mg Tabs (Clonidine hcl) .... Take 1 tab by mouth at bedtime 2)  Crestor 10 Mg Tabs (Rosuvastatin calcium) .... One tab by mouth qhs 3)  Oscal 500/200 D-3 500-200 Mg-unit Tabs (Calcium-vitamin d) .... Take 1 tablet by mouth once a day 4)  Ecotrin Low Strength 81 Mg Tbec (Aspirin) .... Take 1 tablet by mouth once a day 5)  Restoril 15 Mg Caps (Temazepam) .... One to two cpsules at bedtime 6)  Metformin Hcl 500 Mg Tabs (Metformin hcl) .... Take 1 tablet by mouth two times a day  Other Orders: T-Urine Microalbumin w/creat. ratio [phone] [id number]) T- Hemoglobin A1C ([id number])  Patient  Instructions: 1)  f/u in mid June  2)  It is important that you exercise regularly at least 20 minutes 5 times a week. If you develop chest pain, have severe difficulty breathing, or feel very tired , stop exercising immediately and seek medical attention. 3)  You need to lose weight. Consider a lower calorie diet and regular exercise. congrats on 6 pound weight loss, keep it up 4)  Check your blood sugars regularly. If your readings are usually above 200: or below 70 you should contact our office. 5)  HbgA1C prior to visit, ICD-9: 6)  BMP prior to visit, ICD-9:   mid June Prescriptions: METFORMIN HCL 500 MG TABS (METFORMIN HCL) Take 1 tablet by mouth two times a  day  #180 x 2   Entered by:   Everitt Amber LPN   Authorized by:   Syliva Overman MD   Signed by:   Everitt Amber LPN on 04/54/0981   Method used:   Printed then faxed to ...       [facility] [address] [phone]* (retail)       [address] [place], [place]  [address]       Ph: [phone]       Fax: [phone]   RxID:   [id number] METFORMIN HCL 500 MG TABS (METFORMIN HCL) Take 1 tablet by mouth two times a day  #180 x 1   Entered and Authorized by:   Syliva Overman MD   Signed by:   Syliva Overman MD on 01/08/2010   Method used:   Electronically to        [facility] [address] [phone]* (retail)       [address] [place]  [address]       Ph: [phone]       Fax: [phone]   RxID:   [id number]

## 2010-10-24 NOTE — Assessment & Plan Note (Signed)
Summary: office visit   Vital Signs:  Patient profile:   70 year old female Menstrual status:  hysterectomy Height:      64 inches Weight:      194 pounds BMI:     33.42 O2 Sat:      98 % on Room air Pulse rate:   80 / minute Pulse rhythm:   regular Resp:     16 per minute BP sitting:   120 / 84  (left arm)  Vitals Entered By: Adella Hare LPN (September 09, 2010 10:39 AM)  Nutrition Counseling: Patient's BMI is greater than 25 and therefore counseled on weight management options.  O2 Flow:  Room air CC: follow-up visit Is Patient Diabetic? Yes Did you bring your meter with you today? No   CC:  follow-up visit.  History of Present Illness: Reports  that she has been  doing well. Denies recent fever or chills. Denies sinus pressure, nasal congestion , ear pain or sore throat. Denies chest congestion, or cough productive of sputum. Denies chest pain, palpitations, PND, orthopnea or leg swelling.  Denies change in bowel movements or bloody stool. Denies dysuria , frequency, incontinence or hesitancy. Denies  joint pain, swelling, or reduced mobility. Denies headaches, vertigo, seizures. Denies depression, anxiety or insomnia. Denies  rash, lesions, or itch.     Current Medications (verified): 1)  Catapres 0.1 Mg  Tabs (Clonidine Hcl) .... Take 1 Tab By Mouth At Bedtime 2)  Crestor 10 Mg Tabs (Rosuvastatin Calcium) .... One Tab By Mouth Qhs 3)  Oscal 500/200 D-3 500-200 Mg-Unit Tabs (Calcium-Vitamin D) .... Take 1 Tablet By Mouth Once A Day 4)  Ecotrin Low Strength 81 Mg Tbec (Aspirin) .... Take 1 Tablet By Mouth Once A Day 5)  Restoril 15 Mg Caps (Temazepam) .... One To Two Cpsules At Bedtime 6)  Metformin Hcl 500 Mg Tabs (Metformin Hcl) .... Take 1 Tablet By Mouth Two Times A Day  Allergies (verified): 1)  ! Darvocet  Review of Systems Eyes:  Complains of vision loss-1 eye; cataract righ eye. GI:  Complains of abdominal pain, change in bowel habits, and diarrhea;  2week h/o cramping lower abd pain at night with frequency not watery.Up to 4 bm's. omne episode of vomiting, no other fam members affected, genhas a flare once or twice notes that it flares with  matyonaidse. Endo:  Denies cold intolerance, excessive hunger, excessive thirst, and excessive urination; fasting sugars are seldom over 110. Heme:  Denies abnormal bruising and bleeding. Allergy:  Denies hives or rash, itching eyes, persistent infections, and seasonal allergies.  Physical Exam  General:  Well-developedobese,in no acute distress; alert,appropriate and cooperative throughout examination HEENT: No facial asymmetry,  EOMI, No sinus tenderness, TM's Clear, oropharynx  pink and moist.   Chest: Clear to auscultation bilaterally.  CVS: S1, S2, No murmurs, No S3.   Abd: Soft, Nontender.  MS: Adequate ROM spine, hips, shoulders and knees.  Ext: No edema.   CNS: CN 2-12 intact, power tone and sensation normal throughout.   Skin: Intact, no visible lesions or rashes.  Psych: Good eye contact, normal affect.  Memory intact, not anxious or depressed appearing.   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 Take 1 tablet by mouth once a day    Metformin Hcl 500 Mg Tabs (Metformin hcl)  .Marland Kitchen... Take 1 tablet by mouth two times a day Patient advised to reduce carbs and sweets, commit to regular physical activity, take meds as prescribed, test blood sugars as directed, and attempt to lose weight , to improve blood sugar control.  Labs Reviewed: Creat: 0.86 (09/05/2010)    Reviewed HgBA1c results: 6.4 (09/05/2010)  6.6 (03/12/2010)  Problem # 3:  OBESITY, MILD (ICD-278.02) Assessment: Deteriorated  Ht: 64 (09/09/2010)   Wt: 194 (09/09/2010)   BMI: 33.42 (09/09/2010) therapeutic lifestyle change discussed and encouraged  Problem # 4:  ESSENTIAL HYPERTENSION, BENIGN (ICD-401.1) Assessment: Unchanged  Her updated medication list for this problem includes:    Catapres 0.1 Mg Tabs (Clonidine hcl) .Marland Kitchen... Take 1 tab by mouth at bedtime  Orders: T-CMP with estimated GFR ([id number])  BP today: 120/84 Prior BP: 120/78 (06/04/2010)  Labs Reviewed: K+: 4.1 (09/05/2010) Creat: : 0.86 (09/05/2010)   Chol: 166 (09/05/2010)   HDL: 42 (09/05/2010)   LDL: 98 (09/05/2010)   TG: 128 (09/05/2010)  Complete Medication List: 1)  Catapres 0.1 Mg Tabs (Clonidine hcl) .... Take 1 tab by mouth at bedtime 2)  Crestor 10 Mg Tabs (Rosuvastatin calcium) .... One tab by mouth qhs 3)  Oscal 500/200 D-3 500-200 Mg-unit Tabs (Calcium-vitamin d) .... Take 1 tablet by mouth once a day 4)  Ecotrin Low Strength 81 Mg Tbec (Aspirin) .... Take 1 tablet by mouth once a day 5)  Restoril 15 Mg Caps (Temazepam) .... One to two cpsules at bedtime 6)  Metformin Hcl 500 Mg Tabs (Metformin hcl) .... Take 1 tablet by mouth two times a day  Other Orders: T-CBC w/Diff ([id number]) T-TSH [id number]) T- Hemoglobin A1C ([id number])  Patient Instructions: 1)  Please schedule a follow-up appointment in 4 months. 2)  It is important that you exercise regularly at least 30 minutes 5 times a week. If you develop chest pain, have severe difficulty breathing, or feel very tired , stop exercising immediately and seek  medical attention. 3)  You need to lose weight. Consider a lower calorie diet and regular exercise.  4)  Hepatic Panel prior to visit, ICD-9: 5)  Lipid Panel prior to visit, ICD-9: 6)  HbgA1C prior to visit, ICD-9:   fasting in 4 months 7)  Urine Microalbumin prior to visit, ICD-9: 8)  CBC w/ Diff prior to visit, ICD-9: 9)  TSH prior to visit, ICD-9:   Orders Added: 1)  Est. Patient Level IV [56387] 2)  Hemoccult Guaiac-1 spec.(in office) [82270] 3)  T-CBC w/Diff [[id number]] 4)  T-CMP with estimated GFR [[id number]] 5)  T-TSH [[id number]] 6)  T- Hemoglobin  A1C [[id number]]     Laboratory Results  Date/Time Received: September 09, 2010 11:10 AM  Date/Time Reported: September 09, 2010 11:10 AM   Stool - Occult Blood Hemmoccult #1: negative Date: 09/09/2010 Comments: 50201 10L 02/13 118 10/12 [facility] LPN  September 09, 2010 11:10 AM

## 2010-10-24 NOTE — Miscellaneous (Signed)
Summary: Orders Update  Clinical Lists Changes  Orders: Added new Test order of T-CMP with estimated GFR ([id number]) - Signed Added new Test order of T-Lipid Profile [phone]) - Signed Added new Test order of T- Hemoglobin A1C ([id number]) - Signed

## 2010-12-25 LAB — POCT CARDIAC MARKERS
CKMB, poc: 1.2 ng/mL (ref 1.0–8.0)
Troponin i, poc: <0.05 ng/mL (ref 0.00–0.09)

## 2010-12-30 ENCOUNTER — Other Ambulatory Visit: Payer: Self-pay | Admitting: Family Medicine

## 2011-01-08 ENCOUNTER — Encounter: Payer: Self-pay | Admitting: Family Medicine

## 2011-01-09 ENCOUNTER — Ambulatory Visit (INDEPENDENT_AMBULATORY_CARE_PROVIDER_SITE_OTHER): Payer: Medicare Other | Admitting: Family Medicine

## 2011-01-09 ENCOUNTER — Encounter: Payer: Self-pay | Admitting: Family Medicine

## 2011-01-09 VITALS — BP 120/80 | HR 81 | Resp 16 | Ht 64.0 in | Wt 198.1 lb

## 2011-01-09 DIAGNOSIS — R5383 Other fatigue: Secondary | ICD-10-CM

## 2011-01-09 DIAGNOSIS — R5381 Other malaise: Secondary | ICD-10-CM

## 2011-01-09 DIAGNOSIS — G4700 Insomnia, unspecified: Secondary | ICD-10-CM

## 2011-01-09 DIAGNOSIS — G4710 Hypersomnia, unspecified: Secondary | ICD-10-CM

## 2011-01-09 DIAGNOSIS — E119 Type 2 diabetes mellitus without complications: Secondary | ICD-10-CM

## 2011-01-09 MED ORDER — ALPRAZOLAM 0.5 MG PO TABS: 0.5000 mg | ORAL_TABLET | Freq: Every evening | ORAL | Status: DC | PRN

## 2011-01-09 NOTE — Patient Instructions (Addendum)
cPE in 4.5 months.  Fasting labs asap .  You will get a 1600 cal diet sheet , pls follow, you have gained 4 pounds.  Urine today from the office for kidney assesment.  New med at night to help with sleep.  You will be referred for a sleep study  You will be  Given information  on hot flashes.

## 2011-01-10 ENCOUNTER — Encounter: Payer: Self-pay | Admitting: Family Medicine

## 2011-01-10 LAB — MICROALBUMIN / CREATININE URINE RATIO
Creatinine, Urine: 215.2 mg/dL
Microalb Creat Ratio: 21.7 mg/g (ref 0.0–30.0)

## 2011-01-12 ENCOUNTER — Encounter: Payer: Self-pay | Admitting: Family Medicine

## 2011-01-12 NOTE — Progress Notes (Signed)
  Subjective:    Patient ID: Jillian [name], female    DOB: Oct 12, 1940, 70 y.o.   MRN: [id number]  HPI The PT is here for follow up and re-evaluation of chronic medical conditions, medication management and review of recent lab and radiology data.  Preventive health is updated, specifically  Cancer screening, and Immunization.   Questions or concerns regarding consultations or procedures which the PT has had in the interim are  Addressed. She recently had acute respiratory illness as well as gastroenteritis, was treated at urgent care and is better. The PT denies any adverse reactions to current medications since the last visit.  She c/o uncontrolled and persistently disabling hot flashes, with poor sleep and chronic fatigue Diabetes: Pt tests daily, fasting sugars avg 120. She denies polyuria, poldypsia or blurred vision.    Review of Systems See hPI. Denies recent fever or chills. Denies sinus pressure, nasal congestion, ear pain or sore throat. Denies chest congestion, productive cough or wheezing. Denies chest pains, palpitations, paroxysmal nocturnal dyspnea, orthopnea and leg swelling Denies abdominal pain, nausea, vomiting,diarrhea or constipation.  Denies rectal bleeding or change in bowel movement. Denies dysuria, frequency, hesitancy or incontinence. Denies joint pain, swelling and limitation and mobility. Denies headaches, seizure, numbness, or tingling.  Denies skin break down or rash.        Objective:   Physical Exam Patient alert and oriented and in no Cardiopulmonary distress.  HEENT: No facial asymmetry, EOMI, no sinus tenderness, TM's clear, Oropharynx pink and moist.  Neck supple no adenopathy.  Chest: Clear to auscultation bilaterally.  CVS: S1, S2 no murmurs, no S3.  ABD: Soft non tender. Bowel sounds normal.  Ext: No edema  MS: Adequate ROM spine, shoulders, hips and knees.  Skin: Intact, no ulcerations or rash noted.  Psych: Good eye contact,  normal affect. Memory intact not anxious or depressed appearing.  CNS: CN 2-12 intact, power, tone and sensation normal throughout.        Assessment & Plan:  Insomnia:deteriorated, new med is to be added 2. Hypertension:Controlled, no changes in medication.  3Diabetes : controlled , no med changes  4. Obesity: lifestyle changes to be addressed 5. Hyperlipidemia: Hyperlipidemia:Low fat diet discussed and encouraged. Continue crestor

## 2011-01-25 LAB — COMPLETE METABOLIC PANEL WITH GFR
ALT: 17 U/L (ref 0–35)
Alkaline Phosphatase: 68 U/L (ref 39–117)
Sodium: 143 mEq/L (ref 135–145)
Total Bilirubin: 0.4 mg/dL (ref 0.3–1.2)
Total Protein: 7.2 g/dL (ref 6.0–8.3)

## 2011-02-04 NOTE — Discharge Summary (Signed)
[name], [name]               ACCOUNT NO.:  [id number]   MEDICAL RECORD NO.:  [id number]          PATIENT TYPE:  INP   LOCATION:  4739                         FACILITY:  [facility]   PHYSICIAN:  Mohan N. Sharyn Lull, M.D. DATE OF BIRTH:  [birth year]   DATE OF ADMISSION:  11/09/2007  DATE OF DISCHARGE:  11/10/2007                               DISCHARGE SUMMARY   ADMITTING DIAGNOSES:  1. Chest pain, rule out myocardial infarction.  2. Hypertension.  3. Morbid obesity.   FINAL DIAGNOSES:  1. Status post chest pain, negative Persantine Myoview.  2. Hypertension.  3. Hypercholesteremia.  4. Morbid obesity.   DISCHARGE HOME MEDICATIONS:  1. Enteric-coated aspirin 81 mg one tablet daily.  2. Atenolol 25 mg one tablet daily.  3. Crestor 10 mg one tablet daily.  4. Nitrostat 0.4 mg sublingual, use as directed.   DIET:  Low salt, low cholesterol.   ACTIVITY:  As tolerated.   CONDITION AT DISCHARGE:  Stable.   FOLLOWUP:  Follow up with me in 2 weeks.   BRIEF HISTORY:  Jillian [name] is a 70 year old black female with past  medical history significant for hypertension.  She came to the ER  complaining of left-sided chest pain described as pressure, grade 7/10,  radiating to left shoulder off and on for last few days.  She states the  chest pain got worse today, so decided to come to the ER.  She denies  any nausea, vomiting or diaphoresis, denies PND, orthopnea or leg  swelling, denies palpitation, lightheadedness or syncope.  The patient  denies any history of exertional chest pain, but complains of exertional  dyspnea and feeling weak and tired, denies any cough or fever.  She  states she had a stress test last year which was negative.  She denies  relation of chest pain to food, breathing, but occasionally chest pain  gets worse with movement.   PAST MEDICAL HISTORY:  As above.   PAST SURGICAL HISTORY:  1. She had hysterectomy in the past.  2. Appendectomy in the past.  3. Left  breast cyst resection in the past.   SOCIAL HISTORY:  She is married, a housewife.  No history of smoking or  alcohol abuse.   FAMILY HISTORY:  Father is alive; he is 43.  Mother died of cancer.  She  has 3 brothers and 1 sister in good health.   ALLERGIES:  NO KNOWN DRUG ALLERGIES.   MEDICATION AT HOME:  She is on:  1. Clonidine 0.1 mg p.o. daily.  2. Aspirin 81 mg daily.   EXAMINATION:  GENERAL:  She was alert, awake and oriented x3 in no acute  distress.  VITAL SIGNS:  Blood pressure was 110/80.  Pulse was 62,  regular.  Conjunctivae were pink.  NECK:  Supple, no JVD, no bruit.  LUNGS:  Clear to auscultation without rhonchi or rales.  CARDIOVASCULAR:  S1 and S2 were normal.  There was a soft systolic murmur.  No S3 gallop.  ABDOMEN:  Soft.  Bowel sounds were present.  Nontender.  EXTREMITIES:  There was no clubbing, cyanosis or  edema.   LABORATORY DATA:  EKG showed normal sinus rhythm with nonspecific T-wave  changes.   Two sets of cardiac enzymes were negative.  Her cholesterol was 184, LDL  119, HDL 46.  Her CRP was elevated 5.8.  Hemoglobin was 12.9, hematocrit  38.1 and white count of 6.9.   BRIEF HOSPITAL COURSE:  The patient was admitted to telemetry unit.  MI  was ruled out by serial enzymes and EKG.  The patient subsequently  underwent Persantine Myoview, which showed no evidence of reversible  ischemia with EF of 70%.  The patient did not have any episodes of chest  pain during the hospital stay.  The patient has been ambulating in  hallway without any problems.  The patient will be discharged home on  above medications and will be followed up in my office in 2 weeks.  If  she continues to have chest pain,we will get a GI workup as outpatient.      Eduardo Osier. Sharyn Lull, M.D.  Electronically Signed     [facility]/MEDQ  D:  11/10/2007  T:  11/11/2007  Job:  [id number]

## 2011-02-19 ENCOUNTER — Ambulatory Visit: Payer: Medicare Other | Attending: Neurology

## 2011-02-19 DIAGNOSIS — G4733 Obstructive sleep apnea (adult) (pediatric): Secondary | ICD-10-CM | POA: Insufficient documentation

## 2011-02-24 NOTE — Procedures (Signed)
NAMEIVELISE, Jillian [name]               ACCOUNT NO.:  [id number]  MEDICAL RECORD NO.:  [id number]          PATIENT TYPE:  OUT  LOCATION:  SLEEP LAB                     FACILITY:  [facility]  PHYSICIAN:  Stefen Juba A. Gerilyn Pilgrim, M.D. DATE OF BIRTH:  09/08/1941  DATE OF STUDY:  02/19/2011                           NOCTURNAL POLYSOMNOGRAM  REFERRING PHYSICIAN:  Harbor Vanover  REFERRING PHYSICIAN:  Nikolay Demetriou A. Gerilyn Pilgrim, MD  INDICATION:  This is a 70 year old who presents with fatigue, hypersomnia, snoring.  This has been done to evaluate for obstructive sleep apnea syndrome.  INDICATION FOR STUDY:  EPWORTH SLEEPINESS SCORE:  MEDICATIONS:  Aspirin, Crestor, metformin, alprazolam, clonidine, trazodone.  EPWORTH SLEEPINESS SCALE:  Nine.  BMI 34.  ARCHITECTURAL SUMMARY:  The total recording time is 449.5 minutes. Sleep efficiency is 57.6%.  Sleep latency 20.5 minutes.  REM latency 282.5 minutes.  Stage NI 21.2%, N2 of 72.2%, N3 of 0.4% and REM sleep 6.2%.  RESPIRATORY SUMMARY:  Baseline oxygen saturation is 99, lowest saturation 81 during non-REM sleep.  Diagnostic AHI 7.4 and RDI 9.6 with most of the events occurring almost exclusive during the REM sleep.  The REM index is 94 with the patient having frank apneas during REM sleep.  LIMB MOVEMENT SUMMARY:  PLM index 0.  ELECTROCARDIOGRAM SUMMARY:  Average heart rate is 66 with no significant dysrhythmias observed.  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  SLEEP ARCHITECTURE:  RESPIRATORY DATA:  OXYGEN DATA:  CARDIAC DATA:  MOVEMENT-PARASOMNIA:  IMPRESSIONS-RECOMMENDATIONS:     Semir Brill A. Gerilyn Pilgrim, M.D. Electronically Signed 02/24/2011 09:53:06    KAD/MEDQ  D:  02/23/2011 17:16:44  T:  02/24/2011 01:56:12  Job:  [id number]

## 2011-04-10 ENCOUNTER — Other Ambulatory Visit: Payer: Self-pay | Admitting: Family Medicine

## 2011-05-09 ENCOUNTER — Encounter: Payer: Self-pay | Admitting: Family Medicine

## 2011-05-12 ENCOUNTER — Encounter: Payer: Self-pay | Admitting: Family Medicine

## 2011-05-12 ENCOUNTER — Other Ambulatory Visit (HOSPITAL_COMMUNITY)
Admission: RE | Admit: 2011-05-12 | Discharge: 2011-05-12 | Disposition: A | Discharge status: Home / Self Care | Payer: Medicare Other | Source: Ambulatory Visit | Attending: Family Medicine | Admitting: Family Medicine

## 2011-05-12 ENCOUNTER — Ambulatory Visit (INDEPENDENT_AMBULATORY_CARE_PROVIDER_SITE_OTHER): Payer: Medicare Other | Admitting: Family Medicine

## 2011-05-12 VITALS — BP 120/84 | HR 67 | Resp 15 | Ht 64.0 in | Wt 195.0 lb

## 2011-05-12 DIAGNOSIS — G4700 Insomnia, unspecified: Secondary | ICD-10-CM

## 2011-05-12 DIAGNOSIS — R5383 Other fatigue: Secondary | ICD-10-CM

## 2011-05-12 DIAGNOSIS — E119 Type 2 diabetes mellitus without complications: Secondary | ICD-10-CM

## 2011-05-12 DIAGNOSIS — Z1211 Encounter for screening for malignant neoplasm of colon: Secondary | ICD-10-CM

## 2011-05-12 DIAGNOSIS — E785 Hyperlipidemia, unspecified: Secondary | ICD-10-CM

## 2011-05-12 DIAGNOSIS — Z0000 Encounter for general adult medical examination without abnormal findings: Secondary | ICD-10-CM

## 2011-05-12 DIAGNOSIS — R5381 Other malaise: Secondary | ICD-10-CM

## 2011-05-12 DIAGNOSIS — I10 Essential (primary) hypertension: Secondary | ICD-10-CM

## 2011-05-12 DIAGNOSIS — Z01419 Encounter for gynecological examination (general) (routine) without abnormal findings: Secondary | ICD-10-CM | POA: Insufficient documentation

## 2011-05-12 LAB — CBC WITH DIFFERENTIAL/PLATELET
Basophils Absolute: 0 10*3/uL (ref 0.0–0.1)
Basophils Relative: 0 % (ref 0–1)
Lymphocytes Relative: 38 % (ref 12–46)
MCHC: 31.4 g/dL (ref 30.0–36.0)
Neutro Abs: 2.4 10*3/uL (ref 1.7–7.7)
Neutrophils Relative %: 54 % (ref 43–77)
Platelets: 175 10*3/uL (ref 150–400)
RDW: 13.5 % (ref 11.5–15.5)
WBC: 4.4 10*3/uL (ref 4.0–10.5)

## 2011-05-12 LAB — LIPID PANEL
LDL Cholesterol: 42 mg/dL (ref 0–99)
Total CHOL/HDL Ratio: 2.4 Ratio
Triglycerides: 79 mg/dL (ref <150)
VLDL: 16 mg/dL (ref 0–40)

## 2011-05-12 LAB — POC HEMOCCULT BLD/STL (OFFICE/1-CARD/DIAGNOSTIC): Fecal Occult Blood, POC: NEGATIVE

## 2011-05-12 NOTE — Patient Instructions (Signed)
F/u in mid December,  Fasting labs today.  Labs 3 to 5 days before next visit  Call in [facility] September for flu vaccine  It is important that you exercise regularly at least 30 minutes 5 times a week. If you develop chest pain, have severe difficulty breathing, or feel very tired, stop exercising immediately and seek medical attention    A healthy diet is rich in fruit, vegetables and whole grains. Poultry fish, nuts and beans are a healthy choice for protein rather then red meat. A low sodium diet and drinking 64 ounces of water daily is generally recommended. Oils and sweet should be limited. Carbohydrates especially for those who are diabetic or overweight, should be limited to 34-45 gram per meal. It is important to eat on a regular schedule, at least 3 times daily. Snacks should be primarily fruits, vegetables or nuts.

## 2011-05-13 LAB — COMPLETE METABOLIC PANEL WITH GFR
ALT: 17 U/L (ref 0–35)
AST: 21 U/L (ref 0–37)
Calcium: 9.3 mg/dL (ref 8.4–10.5)
Chloride: 106 mEq/L (ref 96–112)
Creat: 0.82 mg/dL (ref 0.50–1.10)
Potassium: 4.1 mEq/L (ref 3.5–5.3)

## 2011-05-13 LAB — HEMOGLOBIN A1C
Hgb A1c MFr Bld: 6.4 % — ABNORMAL HIGH (ref <5.7)
Mean Plasma Glucose: 137 mg/dL — ABNORMAL HIGH (ref <117)

## 2011-05-15 NOTE — Progress Notes (Signed)
  Subjective:    Patient ID: Jillian [name], female    DOB: 09-Jul-1941, 70 y.o.   MRN: [id number]  HPI The PT is here for annual exam and re-evaluation of chronic medical conditions, medication management and review of any available recent lab and radiology data.  Preventive health is updated, specifically  Cancer screening and Immunization.   Questions or concerns regarding consultations or procedures which the PT has had in the interim are  addressed. The PT denies any adverse reactions to current medications since the last visit.  There are no new concerns.  There are no specific complaints except continued hot flashes       Review of Systems Denies recent fever or chills. Denies sinus pressure, nasal congestion, ear pain or sore throat. Denies chest congestion, productive cough or wheezing. Denies chest pains, palpitations and leg swelling Denies abdominal pain, nausea, vomiting,diarrhea or constipation.   Denies dysuria, frequency, hesitancy or incontinence. Denies joint pain, swelling and limitation in mobility. Denies headaches, seizures, numbness, or tingling. Denies depression or  anxiety still has mild  Insomnia, primarily due to uncontrolled hot flashes Denies skin break down or rash.        Objective:   Physical Exam Pleasant well nourished female, alert and oriented x 3, in no cardio-pulmonary distress. Afebrile. HEENT No facial trauma or asymetry. Sinuses non tender.  EOMI, PERTL, fundoscopic exam is normal, no hemorhage or exudate.  External ears normal, tympanic membranes clear. Oropharynx moist, no exudate, fair dentition. Neck: supple, no adenopathy,JVD or thyromegaly.No bruits.  Chest: Clear to ascultation bilaterally.No crackles or wheezes. Non tender to palpation  Breast: No asymetry,no masses. No nipple discharge or inversion. No axillary or supraclavicular adenopathy  Cardiovascular system; Heart sounds normal,  S1 and  S2 ,no S3.  No murmur,  or thrill. Apical beat not displaced Peripheral pulses normal.  Abdomen: Soft, non tender, no organomegaly or masses. No bruits. Bowel sounds normal. No guarding, tenderness or rebound.  Rectal:  No mass. Guaiac negative stool.  GU: External genitalia normal. No lesions. Vaginal canal normal.No discharge. Uterus absent, no adnexal masses, no  adnexal tenderness.  Musculoskeletal exam: Full ROM of spine, hips , shoulders and knees. No deformity ,swelling or crepitus noted. No muscle wasting or atrophy.   Neurologic: Cranial nerves 2 to 12 intact. Power, tone ,sensation and reflexes normal throughout. No disturbance in gait. No tremor.  Skin: Intact, no ulceration, erythema , scaling or rash noted. Pigmentation normal throughout  Psych; Normal mood and affect. Judgement and concentration normal        Assessment & Plan:

## 2011-05-15 NOTE — Assessment & Plan Note (Signed)
Controlled, no change in medication  

## 2011-05-15 NOTE — Assessment & Plan Note (Signed)
Unchanged, encouraged and discussed good sleep hygiene, and dressing light

## 2011-05-27 ENCOUNTER — Other Ambulatory Visit: Payer: Self-pay | Admitting: Family Medicine

## 2011-06-13 LAB — CBC
HCT: 38.1
Hemoglobin: 12.9
MCHC: 33.8
MCV: 93.6
RBC: 3.82 — ABNORMAL LOW
RBC: 4.08
RBC: 4.11
RDW: 13.3
WBC: 6.9
WBC: 8.5

## 2011-06-13 LAB — I-STAT 8, (EC8 V) (CONVERTED LAB)
BUN: 11
Bicarbonate: 26.4 — ABNORMAL HIGH
Glucose, Bld: 92
Sodium: 141
pH, Ven: 7.345 — ABNORMAL HIGH

## 2011-06-13 LAB — COMPREHENSIVE METABOLIC PANEL
AST: 23
Albumin: 3.8
Calcium: 9.6
Chloride: 108
Creatinine, Ser: 0.88
GFR calc Af Amer: >60
Sodium: 142
Total Bilirubin: 0.6

## 2011-06-13 LAB — TROPONIN I: Troponin I: 0.01

## 2011-06-13 LAB — DIFFERENTIAL
Basophils Absolute: 0
Basophils Relative: 0
Lymphocytes Relative: 39
Monocytes Absolute: 0.4
Monocytes Relative: 8
Neutro Abs: 2.7
Neutrophils Relative %: 52

## 2011-06-13 LAB — HEPARIN LEVEL (UNFRACTIONATED)
Heparin Unfractionated: 0.96 — ABNORMAL HIGH
Heparin Unfractionated: 1.06 — ABNORMAL HIGH

## 2011-06-13 LAB — POCT CARDIAC MARKERS
CKMB, poc: 2
Myoglobin, poc: 96.3
Operator id: 288331
Troponin i, poc: <0.05

## 2011-06-13 LAB — CARDIAC PANEL(CRET KIN+CKTOT+MB+TROPI)
CK, MB: 1
CK, MB: 1.3
Relative Index: 0.9
Relative Index: 1.1
Troponin I: 0.01

## 2011-06-13 LAB — CK TOTAL AND CKMB (NOT AT ARMC): Relative Index: 0.9

## 2011-06-13 LAB — APTT: aPTT: 31

## 2011-06-17 ENCOUNTER — Other Ambulatory Visit: Payer: Self-pay | Admitting: Family Medicine

## 2011-06-17 DIAGNOSIS — Z139 Encounter for screening, unspecified: Secondary | ICD-10-CM

## 2011-06-26 ENCOUNTER — Other Ambulatory Visit: Payer: Self-pay | Admitting: Family Medicine

## 2011-07-10 ENCOUNTER — Other Ambulatory Visit: Payer: Self-pay | Admitting: Family Medicine

## 2011-07-28 ENCOUNTER — Ambulatory Visit (HOSPITAL_COMMUNITY)
Admission: RE | Admit: 2011-07-28 | Discharge: 2011-07-28 | Disposition: A | Discharge status: Home / Self Care | Payer: Medicare Other | Source: Ambulatory Visit | Attending: Family Medicine | Admitting: Family Medicine

## 2011-07-28 DIAGNOSIS — Z139 Encounter for screening, unspecified: Secondary | ICD-10-CM

## 2011-07-28 DIAGNOSIS — Z1231 Encounter for screening mammogram for malignant neoplasm of breast: Secondary | ICD-10-CM | POA: Insufficient documentation

## 2011-09-06 LAB — BASIC METABOLIC PANEL WITH GFR
BUN: 12 mg/dL (ref 6–23)
CO2: 23 meq/L (ref 19–32)
Calcium: 9.9 mg/dL (ref 8.4–10.5)
Chloride: 107 meq/L (ref 96–112)
Creat: 0.88 mg/dL (ref 0.50–1.10)
Glucose, Bld: 99 mg/dL (ref 70–99)
Potassium: 5.1 meq/L (ref 3.5–5.3)
Sodium: 143 meq/L (ref 135–145)

## 2011-09-06 LAB — HEMOGLOBIN A1C
Hgb A1c MFr Bld: 6.6 % — ABNORMAL HIGH (ref <5.7)
Mean Plasma Glucose: 143 mg/dL — ABNORMAL HIGH (ref <117)

## 2011-09-08 ENCOUNTER — Encounter: Payer: Self-pay | Admitting: Family Medicine

## 2011-09-10 ENCOUNTER — Encounter: Payer: Self-pay | Admitting: Family Medicine

## 2011-09-10 ENCOUNTER — Ambulatory Visit (INDEPENDENT_AMBULATORY_CARE_PROVIDER_SITE_OTHER): Payer: Medicare Other | Admitting: Family Medicine

## 2011-09-10 VITALS — BP 130/84 | HR 79 | Resp 16 | Ht 64.0 in | Wt 199.4 lb

## 2011-09-10 DIAGNOSIS — E119 Type 2 diabetes mellitus without complications: Secondary | ICD-10-CM

## 2011-09-10 DIAGNOSIS — I10 Essential (primary) hypertension: Secondary | ICD-10-CM

## 2011-09-10 DIAGNOSIS — E785 Hyperlipidemia, unspecified: Secondary | ICD-10-CM

## 2011-09-10 DIAGNOSIS — N951 Menopausal and female climacteric states: Secondary | ICD-10-CM | POA: Insufficient documentation

## 2011-09-10 DIAGNOSIS — R232 Flushing: Secondary | ICD-10-CM

## 2011-09-10 DIAGNOSIS — E663 Overweight: Secondary | ICD-10-CM

## 2011-09-10 DIAGNOSIS — Z1211 Encounter for screening for malignant neoplasm of colon: Secondary | ICD-10-CM

## 2011-09-10 MED ORDER — GABAPENTIN 300 MG PO CAPS: 300.0000 mg | ORAL_CAPSULE | Freq: Every day | ORAL | Status: DC

## 2011-09-10 MED ORDER — PRAVASTATIN SODIUM 20 MG PO TABS: 20.0000 mg | ORAL_TABLET | Freq: Every evening | ORAL | Status: DC

## 2011-09-10 NOTE — Patient Instructions (Addendum)
F/u in end April.  You are referred  For a colonoscopy in March when due, Dr Darrick Penna per your request.  Pls cut down on carbohydrates and sweets you have gained 4 pounds and your blood sugar is a little high.  Start dancing at home, for exercise  TdaP today  Fasting lipid, cmp and EGFR, HBa1C, microalb in April after April 20  New medication for cholesterol, pravastatin, stop crestor when you finish what you have  Altamese Cabal Christmas! And all the best for 2013!

## 2011-09-11 NOTE — Assessment & Plan Note (Signed)
Hyperlipidemia:Low fat diet discussed and encouraged.  Controlled, but med changed due to cost

## 2011-09-11 NOTE — Assessment & Plan Note (Signed)
Uncontrolled , trial of gabapentin

## 2011-09-11 NOTE — Assessment & Plan Note (Signed)
Controlled, no change in medication  

## 2011-09-11 NOTE — Assessment & Plan Note (Signed)
Controlled , but not as good as in the past, increased lifestyle modification only

## 2011-09-11 NOTE — Progress Notes (Signed)
Subjective:     Patient ID: Jillian [name], female   DOB: 05/02/1941, 70 y.o.   MRN: [id number]  HPI The PT is here for follow up and re-evaluation of chronic medical conditions, medication management and review of any available recent lab and radiology data.  Preventive health is updated, specifically  Cancer screening and Immunization.   Questions or concerns regarding consultations or procedures which the PT has had in the interim are  addressed. The PT denies any adverse reactions to current medications since the last visit.  There are no new concerns.  There are no specific complaints except continued hot flashes which disturb her sleep Fasting blood sugars are checked on average 3 times weekly and range between 90 to 120      Review of Systems See HPI Denies recent fever or chills. Denies sinus pressure, nasal congestion, ear pain or sore throat. Denies chest congestion, productive cough or wheezing. Denies chest pains, palpitations and leg swelling Denies abdominal pain, nausea, vomiting,diarrhea or constipation.   Denies dysuria, frequency, hesitancy or incontinence. Denies joint pain, swelling and limitation in mobility. Denies headaches, seizures, numbness, or tingling. Denies depression, anxiety . Denies skin break down or rash.        Objective:   Physical Exam Patient alert and oriented and in no cardiopulmonary distress.  HEENT: No facial asymmetry, EOMI, no sinus tenderness,  oropharynx pink and moist.  Neck supple no adenopathy.  Chest: Clear to auscultation bilaterally.  CVS: S1, S2 no murmurs, no S3.  ABD: Soft non tender. Bowel sounds normal.  Ext: No edema  MS: Adequate ROM spine, shoulders, hips and knees.  Skin: Intact, no ulcerations or rash noted.  Psych: Good eye contact, normal affect. Memory intact not anxious or depressed appearing.  CNS: CN 2-12 intact, power, tone and sensation normal throughout.     Assessment:        Plan:

## 2011-09-11 NOTE — Assessment & Plan Note (Signed)
Deteriorated. Patient re-educated about  the importance of commitment to a  minimum of 150 minutes of exercise per week. The importance of healthy food choices with portion control discussed. Encouraged to start a food diary, count calories and to consider  joining a support group. Sample diet sheets offered. Goals set by the patient for the next several months.    

## 2011-09-17 ENCOUNTER — Telehealth: Payer: Self-pay

## 2011-09-17 NOTE — Telephone Encounter (Signed)
Pt referred from Dr. Lodema Hong for colonoscopy. Per Dr. Anthony Sar note pt is due in March. Could not find anything in E-chart. Called [facility] medical records and requested from Trish.

## 2011-09-23 HISTORY — PX: COLONOSCOPY: SHX174

## 2011-09-23 HISTORY — PX: ESOPHAGOGASTRODUODENOSCOPY: SHX1529

## 2011-09-25 ENCOUNTER — Telehealth: Payer: Self-pay | Admitting: Gastroenterology

## 2011-09-25 NOTE — Telephone Encounter (Signed)
Jillian [name] from Dr Simpson's office was calling to following up on this patient. Please let them know when patient has been triaged and scheduled.

## 2011-09-26 NOTE — Telephone Encounter (Signed)
EGD done in May of 2002. No records of previous colonoscopy. Called to triage pt. LM with Janice Norrie for pt to call, and he said she will call on Mon.

## 2011-09-29 NOTE — Telephone Encounter (Signed)
Pt does not want to schedule appt til March. She said that is when it is due. Per Trish in Medical Records at the hospital pt had EGD by Dr. Katrinka Blazing on 02/17/2001. At that time he said that she needed to have a colonoscopy, but no records on that being done. Pt said she is not having any rectal bleeding, no problems and no family history of colon cancer. She still thinks her colonoscopy is due in March and that is when she wants to have it. I will call back to schedule at the end of Feb or first of March.

## 2011-09-30 NOTE — Telephone Encounter (Signed)
Informed Jillian [name] @ Dr. Anthony Sar that pt wants to wait until March to get scheduled for colonoscopy. She will let Georganna Skeans know. Pt is on my call list for March.

## 2011-09-30 NOTE — Telephone Encounter (Signed)
REVIEWED.  

## 2011-10-23 ENCOUNTER — Telehealth: Payer: Self-pay | Admitting: Gastroenterology

## 2011-10-23 NOTE — Telephone Encounter (Signed)
Pt came to window wanting to be triaged for a colonoscopy. I told her that I would have triage nurse call her. [phone]

## 2011-10-23 NOTE — Telephone Encounter (Signed)
Pt still does not want to schedule until March. I have her chart on my desk to triage a little later in First part of Feb for early March.

## 2011-11-10 ENCOUNTER — Other Ambulatory Visit: Payer: Self-pay

## 2011-11-10 ENCOUNTER — Telehealth: Payer: Self-pay

## 2011-11-10 DIAGNOSIS — Z139 Encounter for screening, unspecified: Secondary | ICD-10-CM

## 2011-11-10 NOTE — Telephone Encounter (Addendum)
Gastroenterology Pre-Procedure Form Pt called back to schedule appt.   Triaged pt. Got disconnected when I was trying to schedule. Called back and no answer.   Request Date: 11/10/2011        Requesting Physician: Lodema Hong     PATIENT INFORMATION:  Jillian [name] is a 71 y.o., female (DOB=01-Apr-1941).  PROCEDURE: Procedure(s) requested: colonoscopy Procedure Reason: screening for colon cancer  PATIENT REVIEW QUESTIONS: The patient reports the following:   1. Diabetes Melitis: yes  2. Joint replacements in the past 12 months: no 3. Major health problems in the past 3 months: no 4. Has an artificial valve or MVP:no 5. Has been advised in past to take antibiotics in advance of a procedure like teeth cleaning: no}    MEDICATIONS & ALLERGIES:    Patient reports the following regarding taking any blood thinners:   Plavix? no Aspirin?yes  Coumadin?  no  Patient confirms/reports the following medications:  Current Outpatient Prescriptions  Medication Sig Dispense Refill  . aspirin (ECOTRIN LOW STRENGTH) 81 MG EC tablet Take 81 mg by mouth daily.        . calcium-vitamin D (OSCAL 500/200 D-3) 500-200 MG-UNIT per tablet Take 1 tablet by mouth 2 (two) times daily.       . cloNIDine (CATAPRES) 0.1 MG tablet TAKE 1 TABLET BY MOUTH EVERY NIGHT AT BEDTIME  90 tablet  1  . gabapentin (NEURONTIN) 300 MG capsule Take 1 capsule (300 mg total) by mouth at bedtime.  30 capsule  3  . metFORMIN (GLUCOPHAGE) 500 MG tablet 500 mg. Takes two tablets at bedtime      . pravastatin (PRAVACHOL) 20 MG tablet Take 1 tablet (20 mg total) by mouth every evening.  90 tablet  3    Patient confirms/reports the following allergies:  Allergies  Allergen Reactions  . Propoxyphene N-Acetaminophen     Patient is appropriate to schedule for requested procedure(s): yes  AUTHORIZATION INFORMATION Primary Insurance:   ID #:   Group #:  Pre-Cert / Auth required:  Pre-Cert / Auth #:   Secondary Insurance:   ID #:    Group #:  Pre-Cert / Auth required:  Pre-Cert / Auth #:   No orders of the defined types were placed in this encounter.    SCHEDULE INFORMATION: Procedure has been scheduled as follows:  Date: 12/12/2011           Time: 8:30 AM                       Location: [facility] Short Stay  This Gastroenterology Pre-Precedure Form is being routed to the following provider(s) for review: Jonette Eva, MD

## 2011-11-10 NOTE — Telephone Encounter (Signed)
MOVI PREP SPLIT DOSING, REGULAR BREAKFAST. CLEAR LIQUIDS AFTER 9 AM.  CONTINUE GLUCOPHAGE.  

## 2011-11-10 NOTE — Telephone Encounter (Signed)
Called to schedule pt. Please see phone note dated 11/10/2011.

## 2011-11-11 NOTE — Telephone Encounter (Signed)
Mailed Rx and instructions to pt.  

## 2011-11-27 ENCOUNTER — Telehealth: Payer: Self-pay

## 2011-11-27 NOTE — Telephone Encounter (Signed)
REVIEWED.  

## 2011-11-27 NOTE — Telephone Encounter (Signed)
Called pt to update triage prior to procedure on 12/12/2011. She has had no new medical problems and no new meds.

## 2011-12-01 ENCOUNTER — Telehealth: Payer: Self-pay

## 2011-12-01 NOTE — Telephone Encounter (Signed)
Pt came by the office to reschedule her colonoscopy from 12/12/2011 to 12/22/2011 at 10:30 AM. Jillian [name] is aware of the change.

## 2011-12-12 ENCOUNTER — Encounter (HOSPITAL_COMMUNITY): Payer: Self-pay | Admitting: Pharmacy Technician

## 2011-12-15 ENCOUNTER — Other Ambulatory Visit: Payer: Self-pay

## 2011-12-15 ENCOUNTER — Emergency Department (HOSPITAL_COMMUNITY): Payer: Medicare Other

## 2011-12-15 ENCOUNTER — Emergency Department (HOSPITAL_COMMUNITY)
Admission: EM | Admit: 2011-12-15 | Discharge: 2011-12-15 | Disposition: A | Discharge status: Home / Self Care | Payer: Medicare Other | Attending: Emergency Medicine | Admitting: Emergency Medicine

## 2011-12-15 ENCOUNTER — Encounter (HOSPITAL_COMMUNITY): Payer: Self-pay | Admitting: *Deleted

## 2011-12-15 DIAGNOSIS — Z7982 Long term (current) use of aspirin: Secondary | ICD-10-CM | POA: Insufficient documentation

## 2011-12-15 DIAGNOSIS — R071 Chest pain on breathing: Secondary | ICD-10-CM | POA: Insufficient documentation

## 2011-12-15 DIAGNOSIS — E785 Hyperlipidemia, unspecified: Secondary | ICD-10-CM | POA: Insufficient documentation

## 2011-12-15 DIAGNOSIS — R0789 Other chest pain: Secondary | ICD-10-CM

## 2011-12-15 DIAGNOSIS — Z79899 Other long term (current) drug therapy: Secondary | ICD-10-CM | POA: Insufficient documentation

## 2011-12-15 DIAGNOSIS — I10 Essential (primary) hypertension: Secondary | ICD-10-CM | POA: Insufficient documentation

## 2011-12-15 DIAGNOSIS — E119 Type 2 diabetes mellitus without complications: Secondary | ICD-10-CM | POA: Insufficient documentation

## 2011-12-15 DIAGNOSIS — R5381 Other malaise: Secondary | ICD-10-CM | POA: Insufficient documentation

## 2011-12-15 HISTORY — DX: Chronic fatigue, unspecified: R53.82

## 2011-12-15 LAB — DIFFERENTIAL
Basophils Absolute: 0 10*3/uL (ref 0.0–0.1)
Eosinophils Relative: 0 % (ref 0–5)
Lymphocytes Relative: 37 % (ref 12–46)
Lymphs Abs: 2.1 10*3/uL (ref 0.7–4.0)
Monocytes Absolute: 0.4 10*3/uL (ref 0.1–1.0)

## 2011-12-15 LAB — BASIC METABOLIC PANEL
CO2: 28 mEq/L (ref 19–32)
Calcium: 9.9 mg/dL (ref 8.4–10.5)
Creatinine, Ser: 0.96 mg/dL (ref 0.50–1.10)
Glucose, Bld: 135 mg/dL — ABNORMAL HIGH (ref 70–99)

## 2011-12-15 LAB — CBC
HCT: 39.1 % (ref 36.0–46.0)
MCH: 30 pg (ref 26.0–34.0)
MCV: 93.1 fL (ref 78.0–100.0)
RDW: 14.1 % (ref 11.5–15.5)
WBC: 5.7 10*3/uL (ref 4.0–10.5)

## 2011-12-15 LAB — URINALYSIS, ROUTINE W REFLEX MICROSCOPIC
Leukocytes, UA: NEGATIVE
Protein, ur: NEGATIVE mg/dL
Urobilinogen, UA: 0.2 mg/dL (ref 0.0–1.0)

## 2011-12-15 LAB — D-DIMER, QUANTITATIVE: D-Dimer, Quant: 0.51 ug/mL-FEU — ABNORMAL HIGH (ref 0.00–0.48)

## 2011-12-15 MED ORDER — HYDROCODONE-ACETAMINOPHEN 5-325 MG PO TABS
1.0000 | ORAL_TABLET | Freq: Once | ORAL | Status: AC
  Administered 2011-12-15: 1 via ORAL
  Filled 2011-12-15: qty 1

## 2011-12-15 MED ORDER — GI COCKTAIL ~~LOC~~
30.0000 mL | Freq: Once | ORAL | Status: AC
  Administered 2011-12-15: 30 mL via ORAL
  Filled 2011-12-15: qty 30

## 2011-12-15 MED ORDER — IOHEXOL 350 MG/ML SOLN
100.0000 mL | Freq: Once | INTRAVENOUS | Status: AC | PRN
  Administered 2011-12-15: 100 mL via INTRAVENOUS

## 2011-12-15 NOTE — ED Provider Notes (Addendum)
History    This chart was scribed for Laray Anger, DO, MD by Smitty Pluck. The patient was seen in room APA19 and the patient's care was started at 12:40PM.   CSN: [id number]  Arrival date & time 12/15/11  1144   First MD Initiated Contact with Patient 12/15/11 1215      Chief Complaint  Patient presents with  . Chest Pain  . Fatigue    The history is provided by the patient.   Jillian [name] is a 71 y.o. female who presents to the Emergency Department complaining of constant right sided chest "heaviness" and occas "sharpness" onset 2 days ago. Symptoms have been constant since onset without radiation. Pt reports laying down especially after eating aggravates the discomfort. Denies symptoms worsen with activity/exertion. Denies N/V/D, no abd pain, no fevers, no palpitations, no SOB/cough.      Past Medical History  Diagnosis Date  . Dyslipidemia   . Hypertension   . Mild obesity   . Postmenopausal   . DJD (degenerative joint disease)     of neck    . Diabetes mellitus     Past Surgical History  Procedure Date  . Left breast biopsy   . Appendectomy   . Abdominal hysterectomy     Family History  Problem Relation Age of Onset  . Heart failure Father   . Hypertension Father   . Hypertension Sister   . Hypertension Brother     History  Substance Use Topics  . Smoking status: Never Smoker   . Smokeless tobacco: Not on file  . Alcohol Use: No    Review of Systems ROS: Statement: All systems negative except as marked or noted in the HPI; Constitutional: Negative for fever and chills. ; ; Eyes: Negative for eye pain, redness and discharge. ; ; ENMT: Negative for ear pain, hoarseness, nasal congestion, sinus pressure and sore throat. ; ; Cardiovascular: +CP.  Negative for palpitations, diaphoresis, dyspnea and peripheral edema. ; ; Respiratory: Negative for cough, wheezing and stridor. ; ; Gastrointestinal: Negative for nausea, vomiting, diarrhea, abdominal pain,  blood in stool, hematemesis, jaundice and rectal bleeding. . ; ; Genitourinary: Negative for dysuria, flank pain and hematuria. ; ; Musculoskeletal: Negative for back pain and neck pain. Negative for swelling and trauma.; ; Skin: Negative for pruritus, rash, abrasions, blisters, bruising and skin lesion.; ; Neuro: Negative for headache, lightheadedness and neck stiffness. Negative for weakness, altered level of consciousness , altered mental status, extremity weakness, paresthesias, involuntary movement, seizure and syncope.      Allergies  Propoxyphene n-acetaminophen  Home Medications   Current Outpatient Rx  Name Route Sig Dispense Refill  . ACETAMINOPHEN 500 MG PO TABS Oral Take 1,000 mg by mouth every 6 (six) hours as needed. For pain    . ASPIRIN EC 81 MG PO TBEC Oral Take 81 mg by mouth at bedtime.    Marland Kitchen CALCIUM CARBONATE-VITAMIN D 500-200 MG-UNIT PO TABS Oral Take 1 tablet by mouth at bedtime.     Marland Kitchen CLONIDINE HCL 0.1 MG PO TABS  TAKE 1 TABLET BY MOUTH EVERY NIGHT AT BEDTIME 90 tablet 1  . GABAPENTIN 300 MG PO CAPS Oral Take 1 capsule (300 mg total) by mouth at bedtime. 30 capsule 3  . METFORMIN HCL 500 MG PO TABS Oral Take 1,000 mg by mouth at bedtime.     Marland Kitchen PRAVASTATIN SODIUM 20 MG PO TABS Oral Take 20 mg by mouth at bedtime.      BP  123/86  Pulse 80  Temp 97.9 F (36.6 C)  Resp 19  Ht 5\' 5"  (1.651 m)  Wt 190 lb (86.183 kg)  BMI 31.62 kg/m2  SpO2 98%  Physical Exam 1245: Physical examination:  Nursing notes reviewed; Vital signs and O2 SAT reviewed;  Constitutional: Well developed, Well nourished, Well hydrated, In no acute distress; Head:  Normocephalic, atraumatic; Eyes: EOMI, PERRL, No scleral icterus; ENMT: Mouth and pharynx normal, Mucous membranes moist; Neck: Supple, Full range of motion, No lymphadenopathy; Cardiovascular: Regular rate and rhythm, No murmur, rub, or gallop; Respiratory: Breath sounds clear & equal bilaterally, No rales, rhonchi, wheezes, or rub, Normal  respiratory effort/excursion; Chest: +TTP right anterior and parasternal chest wall areas, Movement normal; Abdomen: Soft, Nontender, Nondistended, Normal bowel sounds; Extremities: Pulses normal, No tenderness, No edema, No calf edema or asymmetry.; Neuro: AA&Ox3, Major CN grossly intact. Speech clear, no facial droop, gait steady. No gross focal motor or sensory deficits in extremities.; Skin: Color normal, Warm, Dry, no rash.    ED Course  Procedures   11/10/2007 admit for chest pain: BRIEF HOSPITAL COURSE:  The patient was admitted to telemetry unit.  MI   was ruled out by serial enzymes and EKG.  The patient subsequently   underwent Persantine Myoview, which showed no evidence of reversible   ischemia with EF of 70%.  The patient did not have any episodes of chest   pain during the hospital stay.  The patient has been ambulating in   hallway without any problems.  The patient will be discharged home on   above medications and will be followed up in my office in 2 weeks.  If   she continues to have chest pain,we will get a GI workup as outpatient.   Eduardo Osier. Sharyn Lull, M.D.   Electronically Signed   MDM  MDM Reviewed: nursing note, vitals and previous chart Interpretation: ECG, labs, x-ray and CT scan    Date: 12/15/2011  Rate: 86  Rhythm: normal sinus rhythm and sinus arrhythmia  QRS Axis: left  Intervals: normal  ST/T Wave abnormalities: nonspecific T wave changes  Conduction Disutrbances:none  Narrative Interpretation:   Old EKG Reviewed: unchanged; no significant changes from previous EKG dated 08/16/2009.  Results for orders placed during the hospital encounter of 12/15/11  CBC      Component Value Range   WBC 5.7  4.0 - 10.5 (K/uL)   RBC 4.20  3.87 - 5.11 (MIL/uL)   Hemoglobin 12.6  12.0 - 15.0 (g/dL)   HCT 16.1  09.6 - 04.5 (%)   MCV 93.1  78.0 - 100.0 (fL)   MCH 30.0  26.0 - 34.0 (pg)   MCHC 32.2  30.0 - 36.0 (g/dL)   RDW 40.9  81.1 - 91.4 (%)   Platelets 169   150 - 400 (K/uL)  DIFFERENTIAL      Component Value Range   Neutrophils Relative 56  43 - 77 (%)   Neutro Abs 3.2  1.7 - 7.7 (K/uL)   Lymphocytes Relative 37  12 - 46 (%)   Lymphs Abs 2.1  0.7 - 4.0 (K/uL)   Monocytes Relative 7  3 - 12 (%)   Monocytes Absolute 0.4  0.1 - 1.0 (K/uL)   Eosinophils Relative 0  0 - 5 (%)   Eosinophils Absolute 0.0  0.0 - 0.7 (K/uL)   Basophils Relative 0  0 - 1 (%)   Basophils Absolute 0.0  0.0 - 0.1 (K/uL)  BASIC METABOLIC PANEL  Component Value Range   Sodium 140  135 - 145 (mEq/L)   Potassium 3.7  3.5 - 5.1 (mEq/L)   Chloride 102  96 - 112 (mEq/L)   CO2 28  19 - 32 (mEq/L)   Glucose, Bld 135 (*) 70 - 99 (mg/dL)   BUN 14  6 - 23 (mg/dL)   Creatinine, Ser 4.09  0.50 - 1.10 (mg/dL)   Calcium 9.9  8.4 - 81.1 (mg/dL)   GFR calc non Af Amer 59 (*) >90 (mL/min)   GFR calc Af Amer 68 (*) >90 (mL/min)  TROPONIN I      Component Value Range   Troponin I <0.30  <0.30 (ng/mL)  URINALYSIS, ROUTINE W REFLEX MICROSCOPIC      Component Value Range   Color, Urine YELLOW  YELLOW    APPearance CLEAR  CLEAR    Specific Gravity, Urine >1.030 (*) 1.005 - 1.030    pH 5.5  5.0 - 8.0    Glucose, UA NEGATIVE  NEGATIVE (mg/dL)   Hgb urine dipstick SMALL (*) NEGATIVE    Bilirubin Urine NEGATIVE  NEGATIVE    Ketones, ur NEGATIVE  NEGATIVE (mg/dL)   Protein, ur NEGATIVE  NEGATIVE (mg/dL)   Urobilinogen, UA 0.2  0.0 - 1.0 (mg/dL)   Nitrite NEGATIVE  NEGATIVE    Leukocytes, UA NEGATIVE  NEGATIVE   D-DIMER, QUANTITATIVE      Component Value Range   D-Dimer, Quant 0.51 (*) 0.00 - 0.48 (ug/mL-FEU)  TROPONIN I      Component Value Range   Troponin I <0.30  <0.30 (ng/mL)  URINE MICROSCOPIC-ADD ON      Component Value Range   Squamous Epithelial / LPF FEW (*) RARE    WBC, UA 0-2  <3 (WBC/hpf)   RBC / HPF 3-6  <3 (RBC/hpf)   Bacteria, UA RARE  RARE    Ct Angio Chest W/cm &/or Wo Cm 12/15/2011  *RADIOLOGY REPORT*  Clinical Data: Chest pain with elevated D-dimer.   CT ANGIOGRAPHY CHEST  Technique:  Multidetector CT imaging of the chest using the standard protocol during bolus administration of intravenous contrast. Multiplanar reconstructed images including MIPs were obtained and reviewed to evaluate the vascular anatomy.  Contrast: OMNIPAQUE IOHEXOL 350 MG/ML IV SOLN  Comparison: None.  Findings: There are no filling defects in the opacified pulmonary arteries to suggest the presence of an acute pulmonary embolus.  No thoracic aortic aneurysm.  There is no dissection of the thoracic aorta.  No axillary, mediastinal, or hilar lymphadenopathy.  No pericardial or pleural effusion.  Lungs are clear without evidence for pulmonary edema or focal airspace consolidation.  There is some minimal compressive atelectasis at the lung bases.  Bone windows reveal no worrisome lytic or sclerotic osseous lesions.  IMPRESSION: No CT evidence for acute pulmonary embolus.  No acute findings in the chest to explain the patient's history of chest pain with elevated D-dimer.  Original Report Authenticated By: ERIC A. MANSELL, M.D.   Dg Chest Portable 1 View 12/15/2011  *RADIOLOGY REPORT*  Clinical Data: Chest pain.  PORTABLE CHEST - 1 VIEW  Comparison: 08/16/2009.  Findings: The cardiac silhouette, mediastinal and hilar contours are within normal limits and stable.  Mild tortuosity of the thoracic aorta is again demonstrated.  There are left basilar scarring changes.  No infiltrates, edema or effusions.  IMPRESSION: Left basilar scarring but no infiltrates, edema or effusions.  Original Report Authenticated By: P. Loralie Champagne, M.D.     4:03 PM:  Pt  completely improved after GI cocktail.  Has tol PO well and ambulated in the ED without further discomfort. Wants to go home now.  VS remain stable.  EKG unchanged from previous, troponin x2 negative after 2 days of constant pain; pt also has hx of 2 stress tests (most recent above) that were negative; doubt ACS as cause for pain.  CT-A  chest without PE.  Dx testing d/w pt and family.  Questions answered.  Verb understanding, agreeable to d/c home with outpt f/u.           I personally performed the services described in this documentation, which was scribed in my presence. The recorded information has been reviewed and considered.  Zunaira Lamy Allison Quarry, DO 12/18/11 1714

## 2011-12-15 NOTE — ED Notes (Signed)
Pt up to bathroom without assistance.  nad noted. 

## 2011-12-15 NOTE — Discharge Instructions (Signed)
RESOURCE GUIDE  Dental Problems  Patients with Medicaid: [facility]                     [facility] [address]                                           [address] Phone:  [phone]                                                  Phone:  [phone]  If unable to pay or uninsured, contact:  [facility] or [facility] to become qualified for the adult dental clinic.  Chronic Pain Problems Contact [facility]  [phone] Patients need to be referred by their primary care doctor.  Insufficient Money for Medicine Contact [employer]:  call "211" or [facility] 271-5999.  No Primary Care Doctor Call [facility]  832-8000 Other agencies that provide inexpensive medical care    [facility]  [phone]    [facility]  [phone]    [facility]  [phone]    Women's Clinic  [phone]    [facility]  [phone]    [facility]  [phone]  Psychological Services [facility]  [phone] [facility]  [phone] [facility]   [phone] (emergency services [phone])  Substance Abuse Resources [facility]  336-882-2125 [facility] 336-784-9470 [facility] 336-285-9073 [facility] 336-845-3988 Residential & Outpatient Substance Abuse Program  800-659-3381  Abuse/Neglect [place] Child Abuse Hotline (336) 641-3795 [place] Child Abuse Hotline 800-378-5315 (After Hours)  Emergency Shelter [facility] (336) 271-5985  Maternity Homes [facility] (336) 275-9566 [facility] (704) 372-4663  MRSA Hotline #:   832-7006    [place] [facility]  [facility] [place]     [employer]                          [facility] [address] [place]                       [address]      [address]  [place]                                                [place]                            [place] Phone:  [phone]                                   Phone:  [phone]                 Phone:  [phone]  [facility] Phone:  [phone]    [facility] Child Abuse Hotline 714-647-1369 604 011 0296 (After Hours)   Eat a bland diet, avoiding greasy, fatty, fried foods, as well as spicy and acidic foods or beverages.  Avoid eating within the hour or 2 before going to bed or laying down.  Also avoid teas, colas, coffee, chocolate, pepermint and spearment.  Take over the counter pepcid, one tablet by mouth twice a day, for the next 2 to 3 weeks.  May also take over the counter maalox/mylanta, as directed on packaging, as needed for discomfort.  Take your usual prescriptions as previously directed.  Call your regular medical doctor and your GI doctor tomorrow to schedule a follow up appointment this week.  Return to the Emergency Department immediately if worsening.

## 2011-12-15 NOTE — ED Notes (Signed)
Given crackers and soda per pt request.  nad noted.

## 2011-12-15 NOTE — ED Notes (Signed)
Pt states that she began to experience a "heaviness" type sensation when she went to lay  down on Saturday evening. This feeling also associated with weakness, sob, pt also has sharp chest pain on right side of chest worse with breathing at times. Pt states that the heaviness feeling is worse when she is lying down.

## 2011-12-16 ENCOUNTER — Telehealth: Payer: Self-pay | Admitting: Gastroenterology

## 2011-12-16 ENCOUNTER — Other Ambulatory Visit: Payer: Self-pay

## 2011-12-16 DIAGNOSIS — K30 Functional dyspepsia: Secondary | ICD-10-CM

## 2011-12-16 DIAGNOSIS — Z139 Encounter for screening, unspecified: Secondary | ICD-10-CM

## 2011-12-16 NOTE — Telephone Encounter (Signed)
Per Jillian [name], she cancelled previous order for the Colonoscopy. New order entered for colonoscopy and possible EGD.

## 2011-12-16 NOTE — Telephone Encounter (Signed)
Please call pt. We can discuss need for EGD at time of her TCS. Schedule her for TCS/?EGD.

## 2011-12-16 NOTE — Telephone Encounter (Signed)
Pt called this morning asking for DS. She is scheduled for tcs on 12/22/11. Patient went to ED last night and they told her that she may need to have an EGD as well. Patient wanted to know if [facility] would do that also on 12/22/11. I told patient that I would have DS call her back at [phone]

## 2011-12-16 NOTE — Telephone Encounter (Signed)
Informed pt .

## 2011-12-17 ENCOUNTER — Ambulatory Visit (INDEPENDENT_AMBULATORY_CARE_PROVIDER_SITE_OTHER): Payer: Medicare Other | Admitting: Family Medicine

## 2011-12-17 ENCOUNTER — Encounter: Payer: Self-pay | Admitting: Family Medicine

## 2011-12-17 VITALS — BP 130/82 | HR 91 | Resp 16 | Ht 64.0 in | Wt 196.0 lb

## 2011-12-17 DIAGNOSIS — E785 Hyperlipidemia, unspecified: Secondary | ICD-10-CM

## 2011-12-17 DIAGNOSIS — R071 Chest pain on breathing: Secondary | ICD-10-CM

## 2011-12-17 DIAGNOSIS — R0789 Other chest pain: Secondary | ICD-10-CM

## 2011-12-17 DIAGNOSIS — I10 Essential (primary) hypertension: Secondary | ICD-10-CM

## 2011-12-17 DIAGNOSIS — E119 Type 2 diabetes mellitus without complications: Secondary | ICD-10-CM

## 2011-12-17 DIAGNOSIS — M5090 Cervical disc disorder, unspecified, unspecified cervical region: Secondary | ICD-10-CM

## 2011-12-17 LAB — URINE CULTURE: Colony Count: 80000

## 2011-12-17 MED ORDER — ESOMEPRAZOLE MAGNESIUM 40 MG PO CPDR: 40.0000 mg | DELAYED_RELEASE_CAPSULE | Freq: Every day | ORAL | Status: DC

## 2011-12-17 MED ORDER — PREDNISONE (PAK) 5 MG PO TABS: 5.0000 mg | ORAL_TABLET | ORAL | Status: DC

## 2011-12-17 MED ORDER — KETOROLAC TROMETHAMINE 60 MG/2ML IJ SOLN: 60.0000 mg | Freq: Once | INTRAMUSCULAR | Status: DC

## 2011-12-17 MED ORDER — METHYLPREDNISOLONE ACETATE 80 MG/ML IJ SUSP: 80.0000 mg | Freq: Once | INTRAMUSCULAR | Status: DC

## 2011-12-17 MED ORDER — IBUPROFEN 800 MG PO TABS: 800.0000 mg | ORAL_TABLET | Freq: Three times a day (TID) | ORAL | Status: DC | PRN

## 2011-12-17 MED ORDER — KETOROLAC TROMETHAMINE 60 MG/2ML IM SOLN
60.0000 mg | Freq: Once | INTRAMUSCULAR | Status: AC
  Administered 2011-12-17: 60 mg via INTRAMUSCULAR

## 2011-12-17 MED ORDER — METHYLPREDNISOLONE ACETATE 80 MG/ML IJ SUSP
80.0000 mg | Freq: Once | INTRAMUSCULAR | Status: AC
  Administered 2011-12-17: 80 mg via INTRAMUSCULAR

## 2011-12-17 NOTE — Progress Notes (Signed)
  Subjective:    Patient ID: Jillian [name], female    DOB: 1941/05/01, 71 y.o.   MRN: [id number]  HPI Pt here primaril for Ed follow up for chest pain, she ruled out for ACs, reported symptoms at the time of Ed presentation had no significant CAD history associated. Denies recent exercise intolerance or chest pain with activity. She denies pain with deep breathing, cough or hemoptysis. Fasting blood sugars are tested regularly and are less than 120   Review of Systems See HPI Denies recent fever or chills. Denies sinus pressure, nasal congestion, ear pain or sore throat. Denies chest congestion, productive cough or wheezing. Denies chest pains, palpitations and leg swelling Denies abdominal pain, nausea, vomiting,diarrhea or constipation.   Denies dysuria, frequency, hesitancy or incontinence. Denies headaches, seizures, numbness, or tingling. Denies depression, anxiety or insomnia. Denies skin break down or rash.        Objective:   Physical Exam  Patient alert and oriented and in no cardiopulmonary distress.  HEENT: No facial asymmetry, EOMI, no sinus tenderness,  oropharynx pink and moist.  Neck supple no adenopathy.  Chest: Clear to auscultation bilaterally.Reproducible chest wall tenderness on palpation of right 3rd and 4th CC junctions  CVS: S1, S2 no murmurs, no S3.  ABD: Soft non tender. Bowel sounds normal.  Ext: No edema  MS: Adequate ROM spine, shoulders, hips and knees.  Skin: Intact, no ulcerations or rash noted.  Psych: Good eye contact, normal affect. Memory intact not anxious or depressed appearing.  CNS: CN 2-12 intact, power, tone and sensation normal throughout.       Assessment & Plan:

## 2011-12-17 NOTE — Patient Instructions (Addendum)
F/u as before.  You will get injections of toradol and depo medrol ion the office today for neck and chest wall pain. Medication is also sent to your pharmacy.  Our office will call Dr Evelina Dun office and request your endoscopy be re scheduled since you will be on high doses of anti inflammatories  Take nexium one daily for 7 days , we will provide a coupon for 7 day free

## 2011-12-18 ENCOUNTER — Ambulatory Visit: Payer: Medicare Other | Admitting: Family Medicine

## 2011-12-18 LAB — HEMOGLOBIN A1C: Mean Plasma Glucose: 137 mg/dL — ABNORMAL HIGH (ref <117)

## 2011-12-22 ENCOUNTER — Encounter (HOSPITAL_COMMUNITY)
Admission: RE | Disposition: A | Discharge status: Home / Self Care | Payer: Self-pay | Source: Ambulatory Visit | Attending: Gastroenterology

## 2011-12-22 ENCOUNTER — Ambulatory Visit (HOSPITAL_COMMUNITY)
Admission: RE | Admit: 2011-12-22 | Discharge: 2011-12-22 | Disposition: A | Discharge status: Home / Self Care | Payer: Medicare Other | Source: Ambulatory Visit | Attending: Gastroenterology | Admitting: Gastroenterology

## 2011-12-22 ENCOUNTER — Encounter (HOSPITAL_COMMUNITY): Admission: RE | Payer: Self-pay | Source: Ambulatory Visit

## 2011-12-22 ENCOUNTER — Ambulatory Visit (HOSPITAL_COMMUNITY): Admission: RE | Admit: 2011-12-22 | Payer: Medicare Other | Source: Ambulatory Visit | Admitting: Gastroenterology

## 2011-12-22 ENCOUNTER — Encounter (HOSPITAL_COMMUNITY): Payer: Self-pay

## 2011-12-22 DIAGNOSIS — R5381 Other malaise: Secondary | ICD-10-CM | POA: Insufficient documentation

## 2011-12-22 DIAGNOSIS — M5090 Cervical disc disorder, unspecified, unspecified cervical region: Secondary | ICD-10-CM

## 2011-12-22 DIAGNOSIS — K30 Functional dyspepsia: Secondary | ICD-10-CM

## 2011-12-22 DIAGNOSIS — E119 Type 2 diabetes mellitus without complications: Secondary | ICD-10-CM | POA: Insufficient documentation

## 2011-12-22 DIAGNOSIS — K2980 Duodenitis without bleeding: Secondary | ICD-10-CM | POA: Insufficient documentation

## 2011-12-22 DIAGNOSIS — K2970 Gastritis, unspecified, without bleeding: Secondary | ICD-10-CM

## 2011-12-22 DIAGNOSIS — G4730 Sleep apnea, unspecified: Secondary | ICD-10-CM | POA: Insufficient documentation

## 2011-12-22 DIAGNOSIS — D1330 Benign neoplasm of unspecified part of small intestine: Secondary | ICD-10-CM

## 2011-12-22 DIAGNOSIS — Z791 Long term (current) use of non-steroidal anti-inflammatories (NSAID): Secondary | ICD-10-CM | POA: Insufficient documentation

## 2011-12-22 DIAGNOSIS — K2990 Gastroduodenitis, unspecified, without bleeding: Secondary | ICD-10-CM

## 2011-12-22 DIAGNOSIS — Z79899 Other long term (current) drug therapy: Secondary | ICD-10-CM | POA: Insufficient documentation

## 2011-12-22 DIAGNOSIS — K3189 Other diseases of stomach and duodenum: Secondary | ICD-10-CM

## 2011-12-22 DIAGNOSIS — M47812 Spondylosis without myelopathy or radiculopathy, cervical region: Secondary | ICD-10-CM | POA: Insufficient documentation

## 2011-12-22 DIAGNOSIS — K5730 Diverticulosis of large intestine without perforation or abscess without bleeding: Secondary | ICD-10-CM

## 2011-12-22 DIAGNOSIS — Z7982 Long term (current) use of aspirin: Secondary | ICD-10-CM | POA: Insufficient documentation

## 2011-12-22 DIAGNOSIS — I10 Essential (primary) hypertension: Secondary | ICD-10-CM | POA: Insufficient documentation

## 2011-12-22 DIAGNOSIS — K648 Other hemorrhoids: Secondary | ICD-10-CM

## 2011-12-22 DIAGNOSIS — R1013 Epigastric pain: Secondary | ICD-10-CM

## 2011-12-22 DIAGNOSIS — Z139 Encounter for screening, unspecified: Secondary | ICD-10-CM

## 2011-12-22 DIAGNOSIS — E785 Hyperlipidemia, unspecified: Secondary | ICD-10-CM | POA: Insufficient documentation

## 2011-12-22 DIAGNOSIS — Z1211 Encounter for screening for malignant neoplasm of colon: Secondary | ICD-10-CM | POA: Insufficient documentation

## 2011-12-22 DIAGNOSIS — E669 Obesity, unspecified: Secondary | ICD-10-CM | POA: Insufficient documentation

## 2011-12-22 SURGERY — COLONOSCOPY WITH ESOPHAGOGASTRODUODENOSCOPY (EGD)
Anesthesia: Moderate Sedation

## 2011-12-22 SURGERY — COLONOSCOPY
Anesthesia: Moderate Sedation

## 2011-12-22 MED ORDER — MIDAZOLAM HCL 5 MG/5ML IJ SOLN
INTRAMUSCULAR | Status: DC | PRN
  Administered 2011-12-22: 1 mg via INTRAVENOUS
  Administered 2011-12-22 (×2): 2 mg via INTRAVENOUS
  Administered 2011-12-22 (×2): 1 mg via INTRAVENOUS

## 2011-12-22 MED ORDER — MIDAZOLAM HCL 5 MG/5ML IJ SOLN
INTRAMUSCULAR | Status: AC
  Filled 2011-12-22: qty 10

## 2011-12-22 MED ORDER — SODIUM CHLORIDE 0.45 % IV SOLN
Freq: Once | INTRAVENOUS | Status: AC
  Administered 2011-12-22: 09:00:00 via INTRAVENOUS

## 2011-12-22 MED ORDER — DEXTROSE IN LACTATED RINGERS 5 % IV SOLN
INTRAVENOUS | Status: DC
  Administered 2011-12-22: 09:00:00 via INTRAVENOUS

## 2011-12-22 MED ORDER — ESOMEPRAZOLE MAGNESIUM 40 MG PO CPDR: DELAYED_RELEASE_CAPSULE | ORAL | Status: DC

## 2011-12-22 MED ORDER — MEPERIDINE HCL 100 MG/ML IJ SOLN
INTRAMUSCULAR | Status: AC
  Filled 2011-12-22: qty 2

## 2011-12-22 MED ORDER — BUTAMBEN-TETRACAINE-BENZOCAINE 2-2-14 % EX AERO
INHALATION_SPRAY | CUTANEOUS | Status: DC | PRN
  Administered 2011-12-22: 1 via TOPICAL
  Administered 2011-12-22: 2 via TOPICAL

## 2011-12-22 MED ORDER — MEPERIDINE HCL 100 MG/ML IJ SOLN
INTRAMUSCULAR | Status: DC | PRN
  Administered 2011-12-22 (×4): 25 mg via INTRAVENOUS

## 2011-12-22 MED ORDER — STERILE WATER FOR IRRIGATION IR SOLN
Status: DC | PRN
  Administered 2011-12-22: 10:00:00

## 2011-12-22 NOTE — OR Nursing (Signed)
Patient c/o right ac hurting and being slightly swollen from iv site that was used during last er visit

## 2011-12-22 NOTE — Discharge Instructions (Signed)
You have internal hemorrhoids & diverticulosis. You have gastritis/DUODENITIS FROM ASPIRIN AND IBUPROFEN USE W/O NEXIUM, which causes INDIGESTION/abdominal pain. YOUR CHEST PAIN IS LIKELY DUE TO REFLUX & GASTRITIS. I REMOVED A POLYPOID LESIONS FROM YOUR DUODENUM. I biopsied your stomach & duodenum.  FOR YOUR REFLUX & HEALTH:      1. CONTINUE NEXIUM 30 MINUTES PRIOR TO BREAKFAST EVERY MORNING.      2. FOLLOW A LOW FAT/DIABETIC/HIGH FIBER DIET. SEE INFO BELOW.      3. LOSE 10 TO 20 LBS. YOUR BODY MASS INDEX IS 33 CONSISTENT WITH BEING OBESE.  AVOID ITEMS THAT TRIGGER GASTRITIS. SEE INFO BELOW. STOP ASPIRIN FOR 7 DAYS. STOP IBUPROFEN FOR 2 WEEKS. USE TYLENOL AS NEEDED, AVOID FIBER ITEMS THAT CAUSE BLOATING. SEE INFO BELOW.  YOUR BIOPSY RESULTS WILL BE AVAILABLE IN 7 DAYS. FOLLOW UP IN 4 MOS. Next colonoscopy in 10 years.   ENDOSCOPY Care After Read the instructions outlined below and refer to this sheet in the next week. These discharge instructions provide you with general information on caring for yourself after you leave the hospital. While your treatment has been planned according to the most current medical practices available, unavoidable complications occasionally occur. If you have any problems or questions after discharge, call DR. Hetty Linhart, [phone].  ACTIVITY  You may resume your regular activity, but move at a slower pace for the next 24 hours.   Take frequent rest periods for the next 24 hours.   Walking will help get rid of the air and reduce the bloated feeling in your belly (abdomen).   No driving for 24 hours (because of the medicine (anesthesia) used during the test).   You may shower.   Do not sign any important legal documents or operate any machinery for 24 hours (because of the anesthesia used during the test).    NUTRITION  Drink plenty of fluids.   You may resume your normal diet as instructed by your doctor.   Begin with a light meal and progress to  your normal diet. Heavy or fried foods are harder to digest and may make you feel sick to your stomach (nauseated).   Avoid alcoholic beverages for 24 hours or as instructed.    MEDICATIONS  You may resume your normal medications.   WHAT YOU CAN EXPECT TODAY  Some feelings of bloating in the abdomen.   Passage of more gas than usual.   Spotting of blood in your stool or on the toilet paper  .  IF YOU HAD POLYPS REMOVED DURING THE ENDOSCOPY:  Eat a soft diet IF YOU HAVE NAUSEA, BLOATING, ABDOMINAL PAIN, OR VOMITING.    FINDING OUT THE RESULTS OF YOUR TEST Not all test results are available during your visit. DR. Darrick Penna WILL CALL YOU WITHIN 7 DAYS OF YOUR PROCEDUE WITH YOUR RESULTS. Do not assume everything is normal if you have not heard from DR. Mattison Golay IN ONE WEEK, CALL HER OFFICE AT [phone].  SEEK IMMEDIATE MEDICAL ATTENTION AND CALL THE OFFICE: [phone] IF:  You have more than a spotting of blood in your stool.   Your belly is swollen (abdominal distention).   You are nauseated or vomiting.   You have a temperature over 101F.   You have abdominal pain or discomfort that is severe or gets worse throughout the day.   Gastritis/DUODENITIS  Gastritis is an inflammation (the body's way of reacting to injury and/or infection) of the stomach. DUODENITIS is an inflammation (the body's way of reacting to  injury and/or infection) of the FIRST PART OF THE SMALL INTESTINES. It is often caused by bacterial (germ) infections. It can also be caused BY ASPIRIN, BC/GOODY POWDER'S, (IBUPROFEN) MOTRIN, OR ALEVE (NAPROXEN), chemicals (including alcohol), SPICY FOODS, and medications. This illness may be associated with generalized malaise (feeling tired, not well), UPPER ABDOMINAL STOMACH cramps, and fever. One common bacterial cause of gastritis is an organism known as H. Pylori. This can be treated with antibiotics.   High-Fiber Diet A high-fiber diet changes your normal diet to  include more whole grains, legumes, fruits, and vegetables. Changes in the diet involve replacing refined carbohydrates with unrefined foods. The calorie level of the diet is essentially unchanged. The Dietary Reference Intake (recommended amount) for adult males is 38 grams per day. For adult females, it is 25 grams per day. Pregnant and lactating women should consume 28 grams of fiber per day. Fiber is the intact part of a plant that is not broken down during digestion. Functional fiber is fiber that has been isolated from the plant to provide a beneficial effect in the body. PURPOSE  Increase stool bulk.   Ease and regulate bowel movements.   Lower cholesterol.  INDICATIONS THAT YOU NEED MORE FIBER  Constipation and hemorrhoids.   Uncomplicated diverticulosis (intestine condition) and irritable bowel syndrome.   Weight management.   As a protective measure against hardening of the arteries (atherosclerosis), diabetes, and cancer.   GUIDELINES FOR INCREASING FIBER IN THE DIET  Start adding fiber to the diet slowly. A gradual increase of about 5 more grams (2 slices of whole-wheat bread, 2 servings of most fruits or vegetables, or 1 bowl of high-fiber cereal) per day is best. Too rapid an increase in fiber may result in constipation, flatulence, and bloating.   Drink enough water and fluids to keep your urine clear or pale yellow. Water, juice, or caffeine-free drinks are recommended. Not drinking enough fluid may cause constipation.   Eat a variety of high-fiber foods rather than one type of fiber.   Try to increase your intake of fiber through using high-fiber foods rather than fiber pills or supplements that contain small amounts of fiber.   The goal is to change the types of food eaten. Do not supplement your present diet with high-fiber foods, but replace foods in your present diet.  INCLUDE A VARIETY OF FIBER SOURCES  Replace refined and processed grains with whole grains,  canned fruits with fresh fruits, and incorporate other fiber sources. White rice, white breads, and most bakery goods contain little or no fiber.   Brown whole-grain rice, buckwheat oats, and many fruits and vegetables are all good sources of fiber. These include: broccoli, Brussels sprouts, cabbage, cauliflower, beets, sweet potatoes, white potatoes (skin on), carrots, tomatoes, eggplant, squash, berries, fresh fruits, and dried fruits.   Cereals appear to be the richest source of fiber. Cereal fiber is found in whole grains and bran. Bran is the fiber-rich outer coat of cereal grain, which is largely removed in refining. In whole-grain cereals, the bran remains. In breakfast cereals, the largest amount of fiber is found in those with "bran" in their names. The fiber content is sometimes indicated on the label.   You may need to include additional fruits and vegetables each day.   In baking, for 1 cup white flour, you may use the following substitutions:   1 cup whole-wheat flour minus 2 tablespoons.   1/2 cup white flour plus 1/2 cup whole-wheat flour.   Low-Fat Diet  BREADS, CEREALS, PASTA, RICE, DRIED PEAS, AND BEANS These products are high in carbohydrates and most are low in fat. Therefore, they can be increased in the diet as substitutes for fatty foods. They too, however, contain calories and should not be eaten in excess. Cereals can be eaten for snacks as well as for breakfast.  Include foods that contain fiber (fruits, vegetables, whole grains, and legumes). Research shows that fiber may lower blood cholesterol levels, especially the water-soluble fiber found in fruits, vegetables, oat products, and legumes. FRUITS AND VEGETABLES It is good to eat fruits and vegetables. Besides being sources of fiber, both are rich in vitamins and some minerals. They help you get the daily allowances of these nutrients. Fruits and vegetables can be used for snacks and desserts. MEATS Limit lean meat,  chicken, [place], and fish to no more than 6 ounces per day. Beef, Pork, and Lamb Use lean cuts of beef, pork, and lamb. Lean cuts include:  Extra-lean ground beef.  Arm roast.  Sirloin tip.  Center-cut ham.  Round steak.  Loin chops.  Rump roast.  Tenderloin.  Trim all fat off the outside of meats before cooking. It is not necessary to severely decrease the intake of red meat, but lean choices should be made. Lean meat is rich in protein and contains a highly absorbable form of iron. Premenopausal women, in particular, should avoid reducing lean red meat because this could increase the risk for low red blood cells (iron-deficiency anemia). The organ meats, such as liver, sweetbreads, kidneys, and brain are very rich in cholesterol. They should be limited. Chicken and [place] These are good sources of protein. The fat of poultry can be reduced by removing the skin and underlying fat layers before cooking. Chicken and [place] can be substituted for lean red meat in the diet. Poultry should not be fried or covered with high-fat sauces. Fish and Shellfish Fish is a good source of protein. Shellfish contain cholesterol, but they usually are low in saturated fatty acids. The preparation of fish is important. Like chicken and [place], they should not be fried or covered with high-fat sauces. EGGS Egg whites contain no fat or cholesterol. They can be eaten often. Try 1 to 2 egg whites instead of whole eggs in recipes or use egg substitutes that do not contain yolk. MILK AND DAIRY PRODUCTS Use skim or 1% milk instead of 2% or whole milk. Decrease whole milk, natural, and processed cheeses. Use nonfat or low-fat (2%) cottage cheese or low-fat cheeses made from vegetable oils. Choose nonfat or low-fat (1 to 2%) yogurt. Experiment with evaporated skim milk in recipes that call for heavy cream. Substitute low-fat yogurt or low-fat cottage cheese for sour cream in dips and salad dressings. Have at least 2  servings of low-fat dairy products, such as 2 glasses of skim (or 1%) milk each day to help get your daily calcium intake.  FATS AND OILS Reduce the total intake of fats, especially saturated fat. Butterfat, lard, and beef fats are high in saturated fat and cholesterol. These should be avoided as much as possible. Vegetable fats do not contain cholesterol, but certain vegetable fats, such as coconut oil, palm oil, and palm kernel oil are very high in saturated fats. These should be limited. These fats are often used in bakery goods, processed foods, popcorn, oils, and nondairy creamers. Vegetable shortenings and some peanut butters contain hydrogenated oils, which are also saturated fats. Read the labels on these foods and check for saturated  vegetable oils. Unsaturated vegetable oils and fats do not raise blood cholesterol. However, they should be limited because they are fats and are high in calories. Total fat should still be limited to 30% of your daily caloric intake. Desirable liquid vegetable oils are corn oil, cottonseed oil, olive oil, canola oil, safflower oil, soybean oil, and sunflower oil. Peanut oil is not as good, but small amounts are acceptable. Buy a heart-healthy tub margarine that has no partially hydrogenated oils in the ingredients. Mayonnaise and salad dressings often are made from unsaturated fats, but they should also be limited because of their high calorie and fat content. Seeds, nuts, peanut butter, olives, and avocados are high in fat, but the fat is mainly the unsaturated type. These foods should be limited mainly to avoid excess calories and fat. OTHER EATING TIPS Snacks  Most sweets should be limited as snacks. They tend to be rich in calories and fats, and their caloric content outweighs their nutritional value. Some good choices in snacks are graham crackers, melba toast, soda crackers, bagels (no egg), English muffins, fruits, and vegetables. These snacks are preferable to  snack crackers, [place] fries, and chips. Popcorn should be air-popped or cooked in small amounts of liquid vegetable oil. Desserts Eat fruit, low-fat yogurt, and fruit ices. AVOID pastries, cake, and cookies. Sherbet, angel food cake, gelatin dessert, frozen low-fat yogurt, or other frozen products that do not contain saturated fat (pure fruit juice bars, frozen ice pops) are also acceptable.  COOKING METHODS Choose those methods that use little or no fat. They include: Poaching.  Braising.  Steaming.  Grilling.  Baking.  Stir-frying.  Broiling.  Microwaving.  Foods can be cooked in a nonstick pan without added fat, or use a nonfat cooking spray in regular cookware. Limit fried foods and avoid frying in saturated fat. Add moisture to lean meats by using water, broth, cooking wines, and other nonfat or low-fat sauces along with the cooking methods mentioned above. Soups and stews should be chilled after cooking. The fat that forms on top after a few hours in the refrigerator should be skimmed off. When preparing meals, avoid using excess salt. Salt can contribute to raising blood pressure in some people. EATING AWAY FROM HOME Order entres, potatoes, and vegetables without sauces or butter. When meat exceeds the size of a deck of cards (3 to 4 ounces), the rest can be taken home for another meal. Choose vegetable or fruit salads and ask for low-calorie salad dressings to be served on the side. Use dressings sparingly. Limit high-fat toppings, such as bacon, crumbled eggs, cheese, sunflower seeds, and olives. Ask for heart-healthy tub margarine instead of butter.   Diverticulosis Diverticulosis is a common condition that develops when small pouches (diverticula) form in the wall of the colon. The risk of diverticulosis increases with age. It happens more often in people who eat a low-fiber diet. Most individuals with diverticulosis have no symptoms. Those individuals with symptoms usually  experience belly (abdominal) pain, constipation, or loose stools (diarrhea).  HOME CARE INSTRUCTIONS  Increase the amount of fiber in your diet as directed by your caregiver or dietician. This may reduce symptoms of diverticulosis.   Drink at least 6 to 8 glasses of water each day to prevent constipation.   Try not to strain when you have a bowel movement.   Avoiding nuts and seeds to prevent complications is still an uncertain benefit.       FOODS HAVING HIGH FIBER CONTENT INCLUDE:  Fruits.  Apple, peach, pear, tangerine, raisins, prunes.   Vegetables. Brussels sprouts, asparagus, broccoli, cabbage, carrot, cauliflower, romaine lettuce, spinach, summer squash, tomato, winter squash, zucchini.   Starchy Vegetables. Baked beans, kidney beans, lima beans, split peas, lentils, potatoes (with skin).   Grains. Whole wheat bread, brown rice, bran flake cereal, plain oatmeal, white rice, shredded wheat, bran muffins.    SEEK IMMEDIATE MEDICAL CARE IF:  You develop increasing pain or severe bloating.   You have an oral temperature above 101F.   You develop vomiting or bowel movements that are bloody or black.    Hemorrhoids Hemorrhoids are dilated (enlarged) veins around the rectum. Sometimes clots will form in the veins. This makes them swollen and painful. These are called thrombosed hemorrhoids. Causes of hemorrhoids include:  Constipation.   Straining to have a bowel movement.   HEAVY LIFTING HOME CARE INSTRUCTIONS  Eat a well balanced diet and drink 6 to 8 glasses of water every day to avoid constipation. You may also use a bulk laxative.   Avoid straining to have bowel movements.   Keep anal area dry and clean.   Do not use a donut shaped pillow or sit on the toilet for long periods. This increases blood pooling and pain.   Move your bowels when your body has the urge; this will require less straining and will decrease pain and pressure.

## 2011-12-22 NOTE — H&P (Signed)
Primary Care Physician:  Syliva Overman, MD, MD Primary Gastroenterologist:  Dr. Darrick Penna  Pre-Procedure History & Physical: HPI:  Jillian [name] is a 71 y.o. female here for NEW ONSET DYSPEPSIA & SCREENING  Past Medical History  Diagnosis Date  . Dyslipidemia   . Hypertension   . Mild obesity   . Postmenopausal   . DJD (degenerative joint disease)     of neck    . Diabetes mellitus   . Chronic fatigue   . Sleep apnea     Past Surgical History  Procedure Date  . Left breast biopsy   . Appendectomy   . Abdominal hysterectomy     Prior to Admission medications   Medication Sig Start Date End Date Taking? Authorizing Provider  acetaminophen (TYLENOL) 500 MG tablet Take 1,000 mg by mouth every 6 (six) hours as needed. For pain   Yes Historical Provider, MD  aspirin EC 81 MG tablet Take 81 mg by mouth at bedtime.   Yes Historical Provider, MD  calcium-vitamin D (OSCAL 500/200 D-3) 500-200 MG-UNIT per tablet Take 1 tablet by mouth at bedtime.    Yes Historical Provider, MD  cloNIDine (CATAPRES) 0.1 MG tablet TAKE 1 TABLET BY MOUTH EVERY NIGHT AT BEDTIME 07/10/11  Yes Kerri Perches, MD  esomeprazole (NEXIUM) 40 MG capsule Take 1 capsule (40 mg total) by mouth daily. 12/17/11 12/16/12 Yes Kerri Perches, MD  gabapentin (NEURONTIN) 300 MG capsule Take 1 capsule (300 mg total) by mouth at bedtime. 09/10/11 09/09/12 Yes Kerri Perches, MD  ibuprofen (ADVIL,MOTRIN) 800 MG tablet Take 1 tablet (800 mg total) by mouth every 8 (eight) hours as needed for pain. 12/17/11 12/27/11 Yes Kerri Perches, MD  metFORMIN (GLUCOPHAGE) 500 MG tablet Take 1,000 mg by mouth at bedtime.  05/27/11  Yes Kerri Perches, MD  pravastatin (PRAVACHOL) 20 MG tablet Take 20 mg by mouth at bedtime. 09/10/11 09/09/12 Yes Kerri Perches, MD  predniSONE (STERAPRED UNI-PAK) 5 MG TABS Take 1 tablet (5 mg total) by mouth as directed. 12/17/11  Yes Kerri Perches, MD    Allergies as of 12/16/2011 -  Review Complete 12/15/2011  Allergen Reaction Noted  . Propoxacet-n Other (See Comments) 08/10/2008    Family History  Problem Relation Age of Onset  . Heart failure Father   . Hypertension Father   . Hypertension Sister   . Hypertension Brother   NO COLON CA OR POLYPS   History   Social History  . Marital Status: Married    Spouse Name: N/A    Number of Children: N/A  . Years of Education: N/A   Occupational History  . Not on file.   Social History Main Topics  . Smoking status: Never Smoker   . Smokeless tobacco: Not on file  . Alcohol Use: No  . Drug Use: No  . Sexually Active: Not on file   Other Topics Concern  . Not on file   Social History Narrative  . No narrative on file    Review of Systems: See HPI, otherwise negative ROS   Physical Exam: BP 159/88  Pulse 76  Temp 97.8 F (36.6 C)  Resp 14  Ht 5\' 5"  (1.651 m)  Wt 196 lb (88.905 kg)  BMI 32.62 kg/m2  SpO2 99% General:   Alert,  pleasant and cooperative in NAD Head:  Normocephalic and atraumatic. Neck:  Supple;  Lungs:  Clear throughout to auscultation.    Heart:  Regular rate and rhythm.  Abdomen:  Soft, nontender and nondistended. Normal bowel sounds, without guarding, and without rebound.   Neurologic:  Alert and  oriented x4;  grossly normal neurologically.  Impression/Plan:     AVERAGE RISK SCREENING NEW ONSET DYSPEPSIA  PLAN: EGD/TCS TODAY

## 2011-12-23 ENCOUNTER — Telehealth: Payer: Self-pay | Admitting: Gastroenterology

## 2011-12-23 NOTE — Telephone Encounter (Signed)
Results Cc to PCP an appt is in the comp

## 2011-12-23 NOTE — Telephone Encounter (Signed)
Please call pt. HER stomach/SMALL BOWEL Bx shows gastritis/DUODENITIS DUE TO ASPIRIN AND IBUPROFEN.   NO ASPIRIN FOR 1 WEEK.  NO IBUPROFEN FOR 2 WEEKS. NEXIUM DAILY.  CONTINUE WEIGHT LOSS EFFORTS. OPV IN 4 MOS.

## 2011-12-23 NOTE — Op Note (Signed)
[facility] [address] [place], [place]  [address]  DR. FIELD'S TCS EGD PROCEDURE REPORT  PATIENT:  Jillian [name], Jillian [name]  MR#:  [id number] BIRTHDATE:  [birth year], 70 yrs. old  GENDER:  female  ENDOSCOPIST:  Jonette Eva, MD REF. BY:  Syliva Overman, M.D. ASSISTANT:  PROCEDURE DATE:  12/22/2011 PROCEDURE:  Colonoscopy 19147, EGD with biopsy  INDICATIONS:  NEW ONSET DYSPEPSIA & SCREENING PT USES IBUPROFEN AND ASA.  MEDICATIONS:   Demerol 100 mg IV, Versed 7 mg IV  DESCRIPTION OF PROCEDURE:    Physical exam was performed. Informed consent was obtained from the patient after explaining the benefits, risks, and alternatives to procedure.  The patient was connected to monitor and placed in left lateral position. Continuous oxygen was provided by nasal cannula and IV medicine administered through an indwelling cannula.  After administration of sedation, the patient's esophagus was intubated and the EC-3890Li ([id number]) and EG-[id number] ([id number]) endoscope was advanced under direct visualization to the second portion of the duodenum. The scope was removed slowly by carefully examining the color, texture, anatomy, and integrity of the mucosa on the way out.  After administration of sedation and rectal exam, the patient's rectum was intubated and the EC-3890Li ([id number]) and EG-2990i ([id number]) colonoscope was advanced under direct visualization to the cecum.  The scope was removed slowly by carefully examining the color, texture, anatomy, and integrity mucosa on the way out. The patient was recovered in endoscopy and discharged home in satisfactory condition. <<PROCEDUREIMAGES>>  FINDINGS:  SMALL Internal Hemorrhoids were found.  There were mild diverticular changes in left colon. NO POLYPS IN COLON. PATCHY ERYTHEMA IN ANTRUM. BIOPSIES OBTAINED VIA COLD FORCEPS. 3 MM SESSILE POLYPOID LESION IN THE SECONDPORTION OF THE DUODENUM BIOPSIED VIA COLD FORCEPS. NL ESOPHAGUS & AMPULLA.  SML MW TEAR NDUCED BY SCOPE TRAUMA. NO ACTIVE BLEEDING AFTER PROCEDURE.  PREP QUALITY: GOOD CECAL W/D TIME:    18 minutes  COMPLICATIONS:    None  ENDOSCOPIC IMPRESSION: 1) Internal hemorrhoids 2) Diverticulosis,mild,left sided 3) Gastritis, LIKELY NSAID INDUCED 4) Duodenal polyp  RECOMMENDATIONS: Stop asa for 1 week Stop ibuprofen for 2 weeks Continue NEXIUM DAILY AWAIT BIOPSIES HIGH FIBER/LOW FAT DIET OPV IN 4 MOS LOSE 10-20 LBS-A MORE IDEAL WEIGHT 170 LBS.  REPEAT EXAM:  No  ______________________________ Jonette Eva, MD  CC:  Syliva Overman, M.D.  n. eSIGNEDDuncan Dull Delaney Schnick at 12/23/2011 08:26 AM  Greig Castilla, [phone]

## 2011-12-23 NOTE — Telephone Encounter (Signed)
Pt informed

## 2011-12-26 ENCOUNTER — Encounter (HOSPITAL_COMMUNITY): Payer: Self-pay | Admitting: Gastroenterology

## 2011-12-29 NOTE — Assessment & Plan Note (Signed)
Controlled, no change in medication  

## 2011-12-29 NOTE — Assessment & Plan Note (Signed)
Hyperlipidemia:Low fat diet discussed and encouraged.  Updated labs needed 

## 2011-12-29 NOTE — Assessment & Plan Note (Signed)
Anti inflammatories in office and med sent to pharmacy also

## 2012-01-01 ENCOUNTER — Ambulatory Visit (HOSPITAL_COMMUNITY): Admission: RE | Admit: 2012-01-01 | Payer: Medicare Other | Source: Ambulatory Visit | Admitting: Gastroenterology

## 2012-01-01 ENCOUNTER — Ambulatory Visit (INDEPENDENT_AMBULATORY_CARE_PROVIDER_SITE_OTHER): Payer: Medicare Other | Admitting: Family Medicine

## 2012-01-01 ENCOUNTER — Encounter: Payer: Self-pay | Admitting: Family Medicine

## 2012-01-01 VITALS — BP 128/76 | HR 92 | Resp 18 | Wt 195.1 lb

## 2012-01-01 DIAGNOSIS — L97509 Non-pressure chronic ulcer of other part of unspecified foot with unspecified severity: Secondary | ICD-10-CM

## 2012-01-01 DIAGNOSIS — L97529 Non-pressure chronic ulcer of other part of left foot with unspecified severity: Secondary | ICD-10-CM | POA: Insufficient documentation

## 2012-01-01 DIAGNOSIS — E119 Type 2 diabetes mellitus without complications: Secondary | ICD-10-CM

## 2012-01-01 DIAGNOSIS — M25562 Pain in left knee: Secondary | ICD-10-CM

## 2012-01-01 DIAGNOSIS — M25569 Pain in unspecified knee: Secondary | ICD-10-CM

## 2012-01-01 MED ORDER — MELOXICAM 15 MG PO TABS: 15.0000 mg | ORAL_TABLET | Freq: Every day | ORAL | Status: DC | PRN

## 2012-01-01 NOTE — Assessment & Plan Note (Signed)
I'm concerned for early foot ulcer based on the location.and duration of symptoms Right now she does have overlying skin therefore I cannot truly see the base of this. I will refer her to podiatry for evaluation

## 2012-01-01 NOTE — Assessment & Plan Note (Signed)
She is not having a lot of symptoms. She's tried some over-the-counter relievers such as Tylenol and Aleve. I will put her meloxicam as needed. She continues to have persistent pain or increasing pain will obtain x-ray but this is likely osteoarthritis

## 2012-01-01 NOTE — Patient Instructions (Signed)
For your knee- I think this is a little arthritis, take the anti-inflammatory once a day as needed with food. IF this does not improve,please call and we will get an x-ray I am worried about the spot on your left foot, I am sending you to a foot doctor. Your A1C 6.4%, this looks good.  F/U as previous with Dr. Lodema Hong

## 2012-01-01 NOTE — Assessment & Plan Note (Signed)
Well controlled 

## 2012-01-01 NOTE — Progress Notes (Signed)
  Subjective:    Patient ID: Jillian [name], female    DOB: 10-28-1940, 71 y.o.   MRN: [id number]  HPI Diabetes-patient seen by PCP 3 weeks ago for retained medical visit. Blood sugars continue to remain less than 120 fasting. Her last A1c was 6.4%. Left foot pain- she's noticed a spot on the bottom of her left foot for the past 4 weeks. She's not able to see it therefore unsure if it is growing. It is very painful with walking  she does not walk barefoot she has not noticed any drainage  Left knee pain- on and off for the past few weeks, once she gets up and starts moving around   Review of Systems   GEN- denies fatigue, fever, weight loss,weakness, recent illness HEENT- denies eye drainage, change in vision, nasal discharge, CVS- denies chest pain, palpitations RESP- denies SOB, cough, wheeze ABD- denies N/V, change in stools, abd pain GU- denies dysuria, hematuria, dribbling, incontinence MSK- + joint pain, muscle aches, injury Neuro- denies headache, dizziness, syncope, seizure activity      Objective:   Physical Exam  GEN- NAD, alert and oriented x3 Knee- good ROM, left knee TTP along medial joint line, no effusion noted, no warmth, no bony abnormality EXT- No edema Feet- Right foot- mild callus buildup, normal monofilament           Left foot- ?shallow based ulceration noted beneath 3rd digit over metatarsal fat pad, TTP, no erythema, no drainage Pulses- Radial, DP- 2+ Gait- non antalgic       Assessment & Plan:

## 2012-01-11 ENCOUNTER — Other Ambulatory Visit: Payer: Self-pay | Admitting: Family Medicine

## 2012-01-13 ENCOUNTER — Other Ambulatory Visit: Payer: Self-pay | Admitting: Family Medicine

## 2012-01-20 ENCOUNTER — Ambulatory Visit: Payer: Medicare Other | Admitting: Family Medicine

## 2012-02-24 ENCOUNTER — Ambulatory Visit: Payer: Medicare Other | Admitting: Family Medicine

## 2012-04-26 ENCOUNTER — Encounter: Payer: Self-pay | Admitting: Gastroenterology

## 2012-04-28 ENCOUNTER — Ambulatory Visit (INDEPENDENT_AMBULATORY_CARE_PROVIDER_SITE_OTHER): Payer: Medicare Other | Admitting: Gastroenterology

## 2012-04-28 ENCOUNTER — Encounter: Payer: Self-pay | Admitting: Gastroenterology

## 2012-04-28 VITALS — BP 125/80 | HR 90 | Temp 97.4°F | Ht 65.0 in | Wt 193.6 lb

## 2012-04-28 DIAGNOSIS — R1310 Dysphagia, unspecified: Secondary | ICD-10-CM

## 2012-04-28 NOTE — Progress Notes (Signed)
  Subjective:    Patient ID: Jillian [name], female    DOB: 02/05/1941, 71 y.o.   MRN: [id number]  PCP: [facility]PSON  HPI STILL HAVING STRAINING WITH JUICE IN HER THROAT. IF TAKES PILLS ALL AT ONE HAS PROBLEMS SWALLOWING. HAS PROBLEMS WITH WATERMELON. SOLID DYSPHAGIA, NO PROBLEMS WITH LIQUID. LOST 3 LBS SINCE MAR. NAUSEA/VOMITING x1: HAD A SPELL AFTER ATE LATE AT NIGHT, RARE. BMs: 2X IN THE AM. OCCASIONAL PAIN IN BELLY WHEN EATS TOO LATE & THE WRONG THING.   Past Medical History  Diagnosis Date  . Dyslipidemia   . Hypertension   . Mild obesity   . Postmenopausal   . DJD (degenerative joint disease)     of neck    . Diabetes mellitus   . Chronic fatigue   . Sleep apnea     Past Surgical History  Procedure Date  . Left breast biopsy   . Appendectomy   . Abdominal hysterectomy   . Colonoscopy 12/22/2011    internal hemorrhoids  . Esophagogastroduodenoscopy 12/22/2011    diverticulosis mild left-side/gastritis       Review of Systems     Objective:   Physical Exam  Constitutional: She is oriented to person, place, and time. She appears well-nourished. No distress.  HENT:  Head: Normocephalic and atraumatic.  Mouth/Throat: Oropharynx is clear and moist. No oropharyngeal exudate.  Eyes: Pupils are equal, round, and reactive to light. No scleral icterus.  Neck: Normal range of motion. Neck supple.  Cardiovascular: Normal rate, regular rhythm and normal heart sounds.   Pulmonary/Chest: Effort normal and breath sounds normal. No respiratory distress.  Abdominal: Soft. Bowel sounds are normal. She exhibits no distension. There is no tenderness.  Musculoskeletal: Normal range of motion. She exhibits no edema.  Lymphadenopathy:    She has no cervical adenopathy.  Neurological: She is alert and oriented to person, place, and time.       NO FOCAL DEFICITS   Psychiatric: She has a normal mood and affect.          Assessment & Plan:

## 2012-04-28 NOTE — Patient Instructions (Addendum)
COMPLETE YOUR SWALLOWING STUDY NEXT WEEK.  CONTINUE NEXIUM 30 MINUTES PRIOR TO BREAKFAST.  CONTINUE TO LOSE WEIGHT.  FOLLOW A LOW FAT/DIABETIC DIET. SEE INFO BELOW PN A LOW FAT DIET.  FOLLOW UP IN 4 MOS.   Low-Fat Diet BREADS, CEREALS, PASTA, RICE, DRIED PEAS, AND BEANS These products are high in carbohydrates and most are low in fat. Therefore, they can be increased in the diet as substitutes for fatty foods. They too, however, contain calories and should not be eaten in excess. Cereals can be eaten for snacks as well as for breakfast.  Include foods that contain fiber (fruits, vegetables, whole grains, and legumes). Research shows that fiber may lower blood cholesterol levels, especially the water-soluble fiber found in fruits, vegetables, oat products, and legumes. FRUITS AND VEGETABLES It is good to eat fruits and vegetables. Besides being sources of fiber, both are rich in vitamins and some minerals. They help you get the daily allowances of these nutrients. Fruits and vegetables can be used for snacks and desserts. MEATS Limit lean meat, chicken, [place], and fish to no more than 6 ounces per day. Beef, Pork, and Lamb Use lean cuts of beef, pork, and lamb. Lean cuts include:  Extra-lean ground beef.  Arm roast.  Sirloin tip.  Center-cut ham.  Round steak.  Loin chops.  Rump roast.  Tenderloin.  Trim all fat off the outside of meats before cooking. It is not necessary to severely decrease the intake of red meat, but lean choices should be made. Lean meat is rich in protein and contains a highly absorbable form of iron. Premenopausal women, in particular, should avoid reducing lean red meat because this could increase the risk for low red blood cells (iron-deficiency anemia).  Chicken and [place] These are good sources of protein. The fat of poultry can be reduced by removing the skin and underlying fat layers before cooking. Chicken and [place] can be substituted for lean red meat  in the diet. Poultry should not be fried or covered with high-fat sauces. Fish and Shellfish Fish is a good source of protein. Shellfish contain cholesterol, but they usually are low in saturated fatty acids. The preparation of fish is important. Like chicken and [place], they should not be fried or covered with high-fat sauces. EGGS Egg whites contain no fat or cholesterol. They can be eaten often. Try 1 to 2 egg whites instead of whole eggs in recipes or use egg substitutes that do not contain yolk.  MILK AND DAIRY PRODUCTS Use skim or 1% milk instead of 2% or whole milk. Decrease whole milk, natural, and processed cheeses. Use nonfat or low-fat (2%) cottage cheese or low-fat cheeses made from vegetable oils. Choose nonfat or low-fat (1 to 2%) yogurt. Experiment with evaporated skim milk in recipes that call for heavy cream. Substitute low-fat yogurt or low-fat cottage cheese for sour cream in dips and salad dressings. Have at least 2 servings of low-fat dairy products, such as 2 glasses of skim (or 1%) milk each day to help get your daily calcium intake.  FATS AND OILS Butterfat, lard, and beef fats are high in saturated fat and cholesterol. These should be avoided.Vegetable fats do not contain cholesterol. AVOID coconut oil, palm oil, and palm kernel oil, WHICH are very high in saturated fats. These should be limited. These fats are often used in bakery goods, processed foods, popcorn, oils, and nondairy creamers. Vegetable shortenings and some peanut butters contain hydrogenated oils, which are also saturated fats. Read the labels  on these foods and check for saturated vegetable oils.  Desirable liquid vegetable oils are corn oil, cottonseed oil, olive oil, canola oil, safflower oil, soybean oil, and sunflower oil. Peanut oil is not as good, but small amounts are acceptable. Buy a heart-healthy tub margarine that has no partially hydrogenated oils in the ingredients. AVOID Mayonnaise and salad  dressings often are made from unsaturated fats.  OTHER EATING TIPS Snacks  Most sweets should be limited as snacks. They tend to be rich in calories and fats, and their caloric content outweighs their nutritional value. Some good choices in snacks are graham crackers, melba toast, soda crackers, bagels (no egg), English muffins, fruits, and vegetables. These snacks are preferable to snack crackers, [place] fries, and chips. Popcorn should be air-popped or cooked in small amounts of liquid vegetable oil.  Desserts Eat fruit, low-fat yogurt, and fruit ices instead of pastries, cake, and cookies. Sherbet, angel food cake, gelatin dessert, frozen low-fat yogurt, or other frozen products that do not contain saturated fat (pure fruit juice bars, frozen ice pops) are also acceptable.   COOKING METHODS Choose those methods that use little or no fat. They include: Poaching.  Braising.  Steaming.  Grilling.  Baking.  Stir-frying.  Broiling.  Microwaving.  Foods can be cooked in a nonstick pan without added fat, or use a nonfat cooking spray in regular cookware. Limit fried foods and avoid frying in saturated fat. Add moisture to lean meats by using water, broth, cooking wines, and other nonfat or low-fat sauces along with the cooking methods mentioned above. Soups and stews should be chilled after cooking. The fat that forms on top after a few hours in the refrigerator should be skimmed off. When preparing meals, avoid using excess salt. Salt can contribute to raising blood pressure in some people.  EATING AWAY FROM HOME Order entres, potatoes, and vegetables without sauces or butter. When meat exceeds the size of a deck of cards (3 to 4 ounces), the rest can be taken home for another meal. Choose vegetable or fruit salads and ask for low-calorie salad dressings to be served on the side. Use dressings sparingly. Limit high-fat toppings, such as bacon, crumbled eggs, cheese, sunflower seeds, and olives.  Ask for heart-healthy tub margarine instead of butter.

## 2012-04-28 NOTE — Assessment & Plan Note (Addendum)
SOLID DYSPHAGIA IN PT WITH OCCASIONAL GERD. EGD APR 2013 NO DEFINITE STRICTURE.   COMPLETE BPE NEXT WEEK.  CONTINUE NEXIUM 30 MINUTES PRIOR TO BREAKFAST.  CONTINUE TO LOSE WEIGHT.  FOLLOW A LOW FAT/DIABETIC DIET. HO GIVEN.  FOLLOW UP IN 4 MOS.

## 2012-04-28 NOTE — Progress Notes (Signed)
Faxed to PCP

## 2012-04-29 NOTE — Progress Notes (Signed)
Reminder in epic to follow up with SF in [place] in 4 months °

## 2012-05-03 ENCOUNTER — Ambulatory Visit (HOSPITAL_COMMUNITY)
Admission: RE | Admit: 2012-05-03 | Discharge: 2012-05-03 | Disposition: A | Discharge status: Home / Self Care | Payer: Medicare Other | Source: Ambulatory Visit | Attending: Gastroenterology | Admitting: Gastroenterology

## 2012-05-03 DIAGNOSIS — R1310 Dysphagia, unspecified: Secondary | ICD-10-CM

## 2012-05-09 ENCOUNTER — Telehealth: Payer: Self-pay | Admitting: Gastroenterology

## 2012-05-09 NOTE — Telephone Encounter (Signed)
PLEASE CALL PT.   HER INTERMITTENT TROUBLE SWALLOWING IS MOST LIKELY DUE TO UNCONTROLLED REFLUX. HER SX WILL BE OUT OF CONTROL IF SHE EATS THE WRONG THING, TOO LATE AT NIGHT, OR CARRIES TOO MUCH WEIGHT.  SHE SHOULD DO THE FOLLOWING:   Try to achieve and maintain an ideal body weight.   Avoid drinking alcoholic beverages.   MINIMIZE THE USE OF aspirin, ibuprofen (Advil or Motrin), or other nonsteroidal anti-inflammatory drugs.   Do not wear tight clothing around your chest or stomach.   Eat smaller meals and eat more frequently. This keeps your stomach from getting too full. Eat slowly.   Do not lie down for 2 or 3 hours after eating. Do not eat or drink anything 1 to 2 hours before going to bed.   Avoid caffeine beverages (colas, coffee, cocoa, tea), fatty foods, citrus fruits and all other foods and drinks that contain acid and that seem to increase the problems.   Avoid bending over, especially after eating. Also avoid straining during bowel movements or when urinating or lifting things. Anything that increases the pressure in your belly increases the amount of acid that may be pushed up into your esophagus.   OPV IN DEC 2013.

## 2012-05-10 NOTE — Telephone Encounter (Signed)
Recall made 

## 2012-05-10 NOTE — Telephone Encounter (Signed)
Called and informed pt. Also, printed the info and mailed.

## 2012-07-13 ENCOUNTER — Other Ambulatory Visit: Payer: Self-pay | Admitting: Family Medicine

## 2012-08-06 ENCOUNTER — Ambulatory Visit (INDEPENDENT_AMBULATORY_CARE_PROVIDER_SITE_OTHER): Payer: Medicare Other | Admitting: Family Medicine

## 2012-08-06 ENCOUNTER — Encounter: Payer: Self-pay | Admitting: Family Medicine

## 2012-08-06 ENCOUNTER — Ambulatory Visit (HOSPITAL_COMMUNITY)
Admission: RE | Admit: 2012-08-06 | Discharge: 2012-08-06 | Disposition: A | Discharge status: Home / Self Care | Payer: Medicare Other | Source: Ambulatory Visit | Attending: Family Medicine | Admitting: Family Medicine

## 2012-08-06 ENCOUNTER — Other Ambulatory Visit: Payer: Self-pay | Admitting: Family Medicine

## 2012-08-06 VITALS — BP 120/82 | HR 88 | Temp 98.3°F | Resp 16 | Ht 65.0 in | Wt 194.1 lb

## 2012-08-06 DIAGNOSIS — M8530 Osteitis condensans, unspecified site: Secondary | ICD-10-CM | POA: Insufficient documentation

## 2012-08-06 DIAGNOSIS — R109 Unspecified abdominal pain: Secondary | ICD-10-CM

## 2012-08-06 DIAGNOSIS — R5383 Other fatigue: Secondary | ICD-10-CM

## 2012-08-06 DIAGNOSIS — R5381 Other malaise: Secondary | ICD-10-CM

## 2012-08-06 DIAGNOSIS — E785 Hyperlipidemia, unspecified: Secondary | ICD-10-CM

## 2012-08-06 DIAGNOSIS — IMO0001 Reserved for inherently not codable concepts without codable children: Secondary | ICD-10-CM

## 2012-08-06 DIAGNOSIS — E119 Type 2 diabetes mellitus without complications: Secondary | ICD-10-CM

## 2012-08-06 DIAGNOSIS — M25559 Pain in unspecified hip: Secondary | ICD-10-CM

## 2012-08-06 DIAGNOSIS — I10 Essential (primary) hypertension: Secondary | ICD-10-CM

## 2012-08-06 DIAGNOSIS — R319 Hematuria, unspecified: Secondary | ICD-10-CM

## 2012-08-06 LAB — POCT URINALYSIS DIPSTICK
Leukocytes, UA: NEGATIVE
Protein, UA: NEGATIVE
Urobilinogen, UA: 0.2

## 2012-08-06 MED ORDER — MELOXICAM 15 MG PO TABS: 15.0000 mg | ORAL_TABLET | Freq: Every day | ORAL | Status: DC | PRN

## 2012-08-06 MED ORDER — CEFTRIAXONE SODIUM 1 G IJ SOLR
500.0000 mg | Freq: Once | INTRAMUSCULAR | Status: AC
  Administered 2012-08-06: 500 mg via INTRAMUSCULAR

## 2012-08-06 NOTE — Assessment & Plan Note (Addendum)
Normal exam I think this is most likley referred pain from her HIP UA shows small amount of blood in urine seen on previous UA as well, sent for culture- given Rocephin 500mg  to cover UTI based on location of pain KUB neg

## 2012-08-06 NOTE — Progress Notes (Addendum)
  Subjective:    Patient ID: Jillian [name], female    DOB: [birth year], 71 y.o.   MRN: [id number]  HPI  Lower abdominal pain over bladder mostly and hip pain since yesterday. No pain with sitting of laying, but pain with walking and lifting legs. Denies n/v/fever/diarrhea/constipation. No injury. No change in urinary pattern  Review of Systems   GEN- denies fatigue, fever, weight loss,weakness, recent illness HEENT- denies eye drainage, change in vision, nasal discharge, CVS- denies chest pain, palpitations RESP- denies SOB, cough, wheeze ABD- denies N/V, change in stools, abd pain GU- denies dysuria, hematuria, dribbling, incontinence MSK- + joint pain, muscle aches, injury Neuro- denies headache, dizziness, syncope, seizure activity      Objective:   Physical Exam GEN- NAD, alert and oriented x3 HEENT- PERRL, EOMI, non injected sclera, pink conjunctiva, MMM, oropharynx clear CVS- RRR, no murmur RESP-CTAB ABS-NABS,soft,NT,ND,no CVA tenderness, no suprapubic tenderness HIP- pain with IR right side, fair ROM Back- spine NT Pain with stepping and squatting- points to suprapubic region and left hip EXT- No edema Pulses- Radial, DP- 2+        Assessment & Plan:

## 2012-08-06 NOTE — Patient Instructions (Signed)
Start the inflammatory medications for your HIP and Knee Shot of antibiotics given for urine  Get the xrays done. Keep previous appt with Dr. Lodema Hong

## 2012-08-06 NOTE — Assessment & Plan Note (Addendum)
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

## 2012-08-08 LAB — URINE CULTURE

## 2012-08-12 ENCOUNTER — Ambulatory Visit: Payer: Medicare Other | Admitting: Family Medicine

## 2012-08-13 ENCOUNTER — Ambulatory Visit (HOSPITAL_COMMUNITY)
Admission: RE | Admit: 2012-08-13 | Discharge: 2012-08-13 | Disposition: A | Discharge status: Home / Self Care | Payer: Medicare Other | Source: Ambulatory Visit | Attending: Family Medicine | Admitting: Family Medicine

## 2012-08-13 DIAGNOSIS — IMO0001 Reserved for inherently not codable concepts without codable children: Secondary | ICD-10-CM

## 2012-08-13 DIAGNOSIS — Z1231 Encounter for screening mammogram for malignant neoplasm of breast: Secondary | ICD-10-CM | POA: Insufficient documentation

## 2012-08-23 ENCOUNTER — Encounter: Payer: Self-pay | Admitting: *Deleted

## 2012-08-24 ENCOUNTER — Other Ambulatory Visit: Payer: Self-pay | Admitting: Family Medicine

## 2012-08-25 ENCOUNTER — Telehealth: Payer: Self-pay | Admitting: Family Medicine

## 2012-08-25 NOTE — Addendum Note (Signed)
Addended by: Abner Greenspan on: 08/25/2012 08:53 AM   Modules accepted: Orders

## 2012-08-26 NOTE — Telephone Encounter (Signed)
This was sent in electronically on 12/3

## 2012-09-16 ENCOUNTER — Encounter: Payer: Self-pay | Admitting: Family Medicine

## 2012-09-16 ENCOUNTER — Ambulatory Visit (INDEPENDENT_AMBULATORY_CARE_PROVIDER_SITE_OTHER): Payer: Medicare Other | Admitting: Family Medicine

## 2012-09-16 VITALS — BP 130/88 | HR 87 | Temp 99.6°F | Resp 16 | Wt 192.0 lb

## 2012-09-16 DIAGNOSIS — N951 Menopausal and female climacteric states: Secondary | ICD-10-CM

## 2012-09-16 DIAGNOSIS — K529 Noninfective gastroenteritis and colitis, unspecified: Secondary | ICD-10-CM | POA: Insufficient documentation

## 2012-09-16 DIAGNOSIS — K5289 Other specified noninfective gastroenteritis and colitis: Secondary | ICD-10-CM

## 2012-09-16 MED ORDER — PROMETHAZINE HCL 25 MG/ML IJ SOLN
25.0000 mg | Freq: Once | INTRAMUSCULAR | Status: AC
  Administered 2012-09-16: 25 mg via INTRAMUSCULAR

## 2012-09-16 MED ORDER — PROMETHAZINE HCL 12.5 MG PO TABS: 12.5000 mg | ORAL_TABLET | Freq: Four times a day (QID) | ORAL | Status: DC | PRN

## 2012-09-16 NOTE — Patient Instructions (Addendum)
Hold the metformin until you feel better You can take tylenol for fever Plenty of fluids Nausea medicine given/ pick up imodium for the diarrea Call if you do not improve   Viral Gastroenteritis Viral gastroenteritis is also called stomach flu. This illness is caused by a certain type of germ (virus). It can cause sudden watery poop (diarrhea) and throwing up (vomiting). This can cause you to lose body fluids (dehydration). This illness usually lasts for 3 to 8 days. It usually goes away on its own. HOME CARE    Drink enough fluids to keep your pee (urine) clear or pale yellow. Drink small amounts of fluids often.   Ask your doctor how to replace body fluid losses (rehydration).   Avoid:   Foods high in sugar.   Alcohol.   Bubbly (carbonated) drinks.   Tobacco.   Juice.   Caffeine drinks.   Very hot or cold fluids.   Fatty, greasy foods.   Eating too much at one time.   Dairy products until 24 to 48 hours after your watery poop stops.   You may eat foods with active cultures (probiotics). They can be found in some yogurts and supplements.   Wash your hands well to avoid spreading the illness.   Only take medicines as told by your doctor. Do not give aspirin to children. Do not take medicines for watery poop (antidiarrheals).   Ask your doctor if you should keep taking your regular medicines.   Keep all doctor visits as told.  GET HELP RIGHT AWAY IF:    You cannot keep fluids down.   You do not pee at least once every 6 to 8 hours.   You are short of breath.   You see blood in your poop or throw up. This may look like coffee grounds.   You have belly (abdominal) pain that gets worse or is just in one small spot (localized).   You keep throwing up or having watery poop.   You have a fever.   The patient is a child younger than 3 months, and he or she has a fever.   The patient is a child older than 3 months, and he or she has a fever and problems that do  not go away.   The patient is a child older than 3 months, and he or she has a fever and problems that suddenly get worse.   The patient is a baby, and he or she has no tears when crying.  MAKE SURE YOU:    Understand these instructions.   Will watch your condition.   Will get help right away if you are not doing well or get worse.  Document Released: 02/25/2008 Document Revised: 12/01/2011 Document Reviewed: 06/25/2011 [facility] Patient Information 2013 [place], [place].

## 2012-09-16 NOTE — Progress Notes (Signed)
  Subjective:    Patient ID: Jillian [name], female    DOB: 1941/08/26, 71 y.o.   MRN: [id number]  HPI  Pt presents with nausea, vomiting and diarrhea x 1 day. +subjective fever. No sick contacts  No cough or URI symptoms, no dysuria   Review of Systems   GEN- denies fatigue, fever, weight loss,weakness, recent illness HEENT- denies eye drainage, change in vision, nasal discharge, CVS- denies chest pain, palpitations RESP- denies SOB, cough, wheeze ABD- denies N/V, change in stools, abd pain GU- denies dysuria, hematuria, dribbling, incontinence Neuro- denies headache, dizziness, syncope, seizure activity      Objective:   Physical Exam  GEN- NAD, alert and oriented x3 HEENT- PERRL, EOMI, non injected sclera, pink conjunctiva, MMM, oropharynx clear Neck- Supple, no LAD CVS- RRR, no murmur RESP-CTAB ABS-NABS,soft,NT,ND EXT- No edema Pulses- Radial 2+       Assessment & Plan:

## 2012-09-16 NOTE — Assessment & Plan Note (Signed)
Supportive care, given phenergan in office, script for anti-emetic Immodium, push fluids Hold metformin until symptoms resolve Benign abdominal exam

## 2012-09-28 ENCOUNTER — Other Ambulatory Visit: Payer: Self-pay | Admitting: Family Medicine

## 2012-10-13 ENCOUNTER — Other Ambulatory Visit: Payer: Self-pay | Admitting: Family Medicine

## 2012-10-13 ENCOUNTER — Ambulatory Visit (INDEPENDENT_AMBULATORY_CARE_PROVIDER_SITE_OTHER): Payer: Medicare Other | Admitting: Family Medicine

## 2012-10-13 ENCOUNTER — Encounter: Payer: Self-pay | Admitting: Family Medicine

## 2012-10-13 VITALS — BP 132/82 | HR 78 | Resp 18 | Ht 64.0 in | Wt 195.1 lb

## 2012-10-13 DIAGNOSIS — M5090 Cervical disc disorder, unspecified, unspecified cervical region: Secondary | ICD-10-CM

## 2012-10-13 DIAGNOSIS — N644 Mastodynia: Secondary | ICD-10-CM | POA: Insufficient documentation

## 2012-10-13 DIAGNOSIS — I10 Essential (primary) hypertension: Secondary | ICD-10-CM

## 2012-10-13 DIAGNOSIS — Z1211 Encounter for screening for malignant neoplasm of colon: Secondary | ICD-10-CM

## 2012-10-13 DIAGNOSIS — E785 Hyperlipidemia, unspecified: Secondary | ICD-10-CM

## 2012-10-13 DIAGNOSIS — R229 Localized swelling, mass and lump, unspecified: Secondary | ICD-10-CM

## 2012-10-13 DIAGNOSIS — IMO0002 Reserved for concepts with insufficient information to code with codable children: Secondary | ICD-10-CM | POA: Insufficient documentation

## 2012-10-13 DIAGNOSIS — E119 Type 2 diabetes mellitus without complications: Secondary | ICD-10-CM

## 2012-10-13 LAB — HEMOGLOBIN A1C: Mean Plasma Glucose: 146 mg/dL — ABNORMAL HIGH (ref <117)

## 2012-10-13 LAB — COMPLETE METABOLIC PANEL WITH GFR
ALT: 16 U/L (ref 0–35)
AST: 18 U/L (ref 0–37)
Calcium: 10 mg/dL (ref 8.4–10.5)
Chloride: 108 mEq/L (ref 96–112)
Creat: 0.85 mg/dL (ref 0.50–1.10)
Total Bilirubin: 0.4 mg/dL (ref 0.3–1.2)

## 2012-10-13 LAB — TSH: TSH: 1.146 u[IU]/mL (ref 0.350–4.500)

## 2012-10-13 LAB — LIPID PANEL
LDL Cholesterol: 72 mg/dL (ref 0–99)
Triglycerides: 97 mg/dL (ref <150)
VLDL: 19 mg/dL (ref 0–40)

## 2012-10-13 NOTE — Progress Notes (Signed)
  Subjective:    Patient ID: Jillian [name], female    DOB: 26-Feb-1941, 72 y.o.   MRN: [id number]  HPI  The PT is here for follow up and re-evaluation of chronic medical conditions, medication management and review of any available recent lab and radiology data.  Preventive health is updated, specifically  Cancer screening and Immunization.   Questions or concerns regarding consultations or procedures which the PT has had in the interim are  addressed. The PT denies any adverse reactions to current medications since the last visit.  3 month h/o tender swelling on left arm, no increase in size however concerned that she "gets rid of it"  C/o right breast pain and tenderness x 1 month, denies nipple d/c , palpable breast mass, or swollen glands on affected briease    Review of Systems See HPI Denies recent fever or chills. Denies sinus pressure, nasal congestion, ear pain or sore throat. Denies chest congestion, productive cough or wheezing. Denies chest pains, palpitations and leg swelling Denies abdominal pain, nausea, vomiting,diarrhea or constipation.   Denies dysuria, frequency, hesitancy or incontinence. Denies joint pain, swelling and limitation in mobility. Denies headaches, seizures, numbness, or tingling. Denies depression, anxiety or insomnia.       Objective:   Physical Exam  Patient alert and oriented and in no cardiopulmonary distress.  HEENT: No facial asymmetry, EOMI, no sinus tenderness,  oropharynx pink and moist.  Neck supple no adenopathy.  Chest: Clear to auscultation bilaterally.  Breast:no mass palpable, no nipple inversion, no palpable axillary or supraclavicular  Nodes  CVS: S1, S2 no murmurs, no S3.  ABD: Soft non tender. Bowel sounds normal.  Ext: No edema  MS: Adequate ROM spine, shoulders, hips and knees.  Skin: Intact, no ulcerations or rash noted.palpable forearm cyst non tender diameter approx 2.5 cm  Psych: Good eye contact, normal  affect. Memory intact not anxious or depressed appearing.  CNS: CN 2-12 intact, power, tone and sensation normal throughout.      Assessment & Plan:

## 2012-10-13 NOTE — Patient Instructions (Addendum)
F/u in 4.5 month, foot exam at that visit, please call if you need me before  Please get labs today lipid , cmp and EGFr, hBa1C and TSH, vit D  Microalb from office today   Non fasting chem 7, HBA1C and CBc in 4.5 month  You are referred for surgeon to evaluate the right arm cyst, I do not believe this is cancerous , it will not go away with medication or on its own  You are referred for an ultrasound of the right breast where you have pain and a swelling

## 2012-10-14 LAB — MICROALBUMIN / CREATININE URINE RATIO
Creatinine, Urine: 246.4 mg/dL
Microalb, Ur: 0.96 mg/dL (ref 0.00–1.89)

## 2012-10-14 LAB — VITAMIN D 25 HYDROXY (VIT D DEFICIENCY, FRACTURES): Vit D, 25-Hydroxy: 41 ng/mL (ref 30–89)

## 2012-10-19 ENCOUNTER — Other Ambulatory Visit: Payer: Self-pay | Admitting: Family Medicine

## 2012-10-20 ENCOUNTER — Encounter (HOSPITAL_COMMUNITY): Payer: Medicare Other

## 2012-10-31 NOTE — Assessment & Plan Note (Signed)
C/o right breast pain , recent onset, will refer for [place] of affected breast

## 2012-10-31 NOTE — Assessment & Plan Note (Signed)
Deteriorated. No med change Patient advised to reduce carb and sweets, commit to regular physical activity, take meds as prescribed, test blood as directed, and attempt to lose weight, to improve blood sugar control.  

## 2012-10-31 NOTE — Assessment & Plan Note (Signed)
Controlled, no change in medication DASH diet and commitment to daily physical activity for a minimum of 30 minutes discussed and encouraged, as a part of hypertension management. The importance of attaining a healthy weight is also discussed.  

## 2012-10-31 NOTE — Assessment & Plan Note (Signed)
Cystic forearm swelling, likely benign, hiowever , pt requests removal, concerned, will refer for eval and management by surgery

## 2012-10-31 NOTE — Assessment & Plan Note (Signed)
Controlled, no change in medication Hyperlipidemia:Low fat diet discussed and encouraged.  \ 

## 2012-11-10 ENCOUNTER — Other Ambulatory Visit: Payer: Self-pay | Admitting: Family Medicine

## 2012-11-10 ENCOUNTER — Ambulatory Visit (HOSPITAL_COMMUNITY)
Admission: RE | Admit: 2012-11-10 | Discharge: 2012-11-10 | Disposition: A | Discharge status: Home / Self Care | Payer: Medicare Other | Source: Ambulatory Visit | Attending: Family Medicine | Admitting: Family Medicine

## 2012-11-10 DIAGNOSIS — N644 Mastodynia: Secondary | ICD-10-CM

## 2012-11-10 DIAGNOSIS — N63 Unspecified lump in unspecified breast: Secondary | ICD-10-CM | POA: Insufficient documentation

## 2012-11-10 DIAGNOSIS — R928 Other abnormal and inconclusive findings on diagnostic imaging of breast: Secondary | ICD-10-CM

## 2012-11-19 ENCOUNTER — Ambulatory Visit (INDEPENDENT_AMBULATORY_CARE_PROVIDER_SITE_OTHER): Payer: Medicare Other | Admitting: Family Medicine

## 2012-11-19 ENCOUNTER — Encounter: Payer: Self-pay | Admitting: Family Medicine

## 2012-11-19 VITALS — BP 142/76 | HR 100 | Resp 18 | Ht 65.0 in | Wt 199.1 lb

## 2012-11-19 DIAGNOSIS — M1710 Unilateral primary osteoarthritis, unspecified knee: Secondary | ICD-10-CM

## 2012-11-19 DIAGNOSIS — IMO0002 Reserved for concepts with insufficient information to code with codable children: Secondary | ICD-10-CM

## 2012-11-19 DIAGNOSIS — I10 Essential (primary) hypertension: Secondary | ICD-10-CM

## 2012-11-19 DIAGNOSIS — M179 Osteoarthritis of knee, unspecified: Secondary | ICD-10-CM | POA: Insufficient documentation

## 2012-11-19 MED ORDER — DICLOFENAC SODIUM 1 % TD GEL: TRANSDERMAL | Status: DC

## 2012-11-19 NOTE — Patient Instructions (Addendum)
Take the meloxicam once a day for your knees Use the anti-inflammatory ( Voltaren) gel as needed for your knees, you can then stop the pill Do not take both meloxicam and aleve Call if your knees do not improve and you will be referred to orthopedic surgeon Vitamin is called ESTROVEN

## 2012-11-19 NOTE — Assessment & Plan Note (Signed)
Bilateral knee pain bleed this is osteoarthritis based on exam she is no effusion today. We discussed different options we will hold on orthopedic referral she'll take the meloxicam once a day I will also try to get her Voltaren gel to use on an as-needed basis

## 2012-11-19 NOTE — Assessment & Plan Note (Signed)
Blood pressure was repeated at a little elevated from her baseline however she takes her medications in the evening her previous blood pressures have looked good no change the medication

## 2012-11-19 NOTE — Progress Notes (Signed)
  Subjective:    Patient ID: Jillian [name], female    DOB: [birth year], 72 y.o.   MRN: [id number]  HPI Patient presents with bilateral knee pain left greater than right she has pain when she first wakes in the morning and after she's been sitting in a chair when she gets moving around she is doing fine. She did start taking the meloxicam which she had left over the past couple of days which is helps some. She denies any swelling of her knee locking of the knee or giving out.    Review of Systems   GEN- denies fatigue, fever, weight loss,weakness, recent illness HEENT- denies eye drainage, change in vision, nasal discharge, CVS- denies chest pain, palpitations RESP- denies SOB, cough, wheeze ABD- denies N/V, change in stools, abd pain GU- denies dysuria, hematuria, dribbling, incontinence MSK- + joint pain, muscle aches, injury Neuro- denies headache, dizziness, syncope, seizure activity      Objective:   Physical Exam  GEN- NAD, alert and oriented x3ly CVS- RRR, no murmur RESP-CTAB MSK- bilateral knee- normal inspection, no effusion, bilateral crepitus, fair ROM, neg SLR, fair ROM bilateral HIP, non antalgic gait EXT- No edema Pulses- Radial, DP- 2+       Assessment & Plan:

## 2012-11-28 ENCOUNTER — Other Ambulatory Visit: Payer: Self-pay | Admitting: Family Medicine

## 2012-12-27 ENCOUNTER — Other Ambulatory Visit: Payer: Self-pay | Admitting: Family Medicine

## 2013-01-18 ENCOUNTER — Other Ambulatory Visit: Payer: Self-pay

## 2013-01-18 MED ORDER — CLONIDINE HCL 0.1 MG PO TABS: ORAL_TABLET | ORAL | Status: DC

## 2013-02-23 ENCOUNTER — Encounter: Payer: Self-pay | Admitting: Family Medicine

## 2013-02-23 ENCOUNTER — Other Ambulatory Visit: Payer: Self-pay

## 2013-02-23 ENCOUNTER — Ambulatory Visit (INDEPENDENT_AMBULATORY_CARE_PROVIDER_SITE_OTHER): Payer: Medicare Other | Admitting: Family Medicine

## 2013-02-23 VITALS — BP 122/82 | HR 78 | Resp 16 | Ht 65.0 in | Wt 199.0 lb

## 2013-02-23 DIAGNOSIS — E119 Type 2 diabetes mellitus without complications: Secondary | ICD-10-CM

## 2013-02-23 DIAGNOSIS — N951 Menopausal and female climacteric states: Secondary | ICD-10-CM

## 2013-02-23 DIAGNOSIS — R5381 Other malaise: Secondary | ICD-10-CM

## 2013-02-23 DIAGNOSIS — E785 Hyperlipidemia, unspecified: Secondary | ICD-10-CM

## 2013-02-23 DIAGNOSIS — E663 Overweight: Secondary | ICD-10-CM

## 2013-02-23 DIAGNOSIS — I10 Essential (primary) hypertension: Secondary | ICD-10-CM

## 2013-02-23 MED ORDER — METFORMIN HCL 500 MG PO TABS: ORAL_TABLET | ORAL | Status: DC

## 2013-02-23 MED ORDER — PRAVASTATIN SODIUM 20 MG PO TABS: ORAL_TABLET | ORAL | Status: DC

## 2013-02-23 MED ORDER — ALPRAZOLAM 0.5 MG PO TABS: ORAL_TABLET | ORAL | Status: DC

## 2013-02-23 NOTE — Patient Instructions (Addendum)
F/U in mid to end October, call if you need me before  Fasting lipid, cmp and EGFR , HBA1c, CBC next week.  HBA1C and chem 7  In end October   It is important that you exercise regularly at least 30 minutes 5 times a week. If you develop chest pain, have severe difficulty breathing, or feel very tired, stop exercising immediately and seek medical attention    A healthy diet is rich in fruit, vegetables and whole grains. Poultry fish, nuts and beans are a healthy choice for protein rather then red meat. A low sodium diet and drinking 64 ounces of water daily is generally recommended. Oils and sweet should be limited. Carbohydrates especially for those who are diabetic or overweight, should be limited to 30-45 gram per meal. It is important to eat on a regular schedule, at least 3 times daily. Snacks should be primarily fruits, vegetables or nuts.  I will contact Dr Lovell Sheehan to let him know you want the growth removed from your right arm, his office should contact you . If you do nott hear from them in the next 7 to 10 days then call about this

## 2013-02-23 NOTE — Progress Notes (Signed)
  Subjective:    Patient ID: Jillian [name], female    DOB: [birth year], 72 y.o.   MRN: [id number]  HPI The PT is here for follow up and re-evaluation of chronic medical conditions, medication management and review of any available recent lab and radiology data.  Preventive health is updated, specifically  Cancer screening and Immunization.   Questions or concerns regarding consultations or procedures which the PT has had in the interim are  addressed. The PT denies any adverse reactions to current medications since the last visit.  There are no new concerns.  There are no specific complaints  Denies polyuria, polydipsia or blurred, vision, denies hypoglycemic episodes      Review of Systems See HPI Denies recent fever or chills. Denies sinus pressure, nasal congestion, ear pain or sore throat. Denies chest congestion, productive cough or wheezing. Denies chest pains, palpitations and leg swelling Denies abdominal pain, nausea, vomiting,diarrhea or constipation.   Denies dysuria, frequency, hesitancy or incontinence. Denies joint pain, swelling and limitation in mobility. Denies headaches, seizures, numbness, or tingling. Denies depression, anxiety or insomnia. Denies skin break down or rash.        Objective:   Physical Exam  Patient alert and oriented and in no cardiopulmonary distress.  HEENT: No facial asymmetry, EOMI, no sinus tenderness,  oropharynx pink and moist.  Neck supple no adenopathy.  Chest: Clear to auscultation bilaterally.  CVS: S1, S2 no murmurs, no S3.  ABD: Soft non tender. Bowel sounds normal.  Ext: No edema  MS: Adequate ROM spine, shoulders, hips and knees.  Skin: Intact, no ulcerations or rash noted.  Psych: Good eye contact, normal affect. Memory intact not anxious or depressed appearing.  CNS: CN 2-12 intact, power, tone and sensation normal throughout.       Assessment & Plan:

## 2013-02-27 NOTE — Assessment & Plan Note (Signed)
Controlled, no change in medication DASH diet and commitment to daily physical activity for a minimum of 30 minutes discussed and encouraged, as a part of hypertension management. The importance of attaining a healthy weight is also discussed.  

## 2013-02-27 NOTE — Assessment & Plan Note (Signed)
Improved with OTC natural product containing wild yam

## 2013-02-27 NOTE — Assessment & Plan Note (Signed)
Hyperlipidemia:Low fat diet discussed and encouraged.  Updated lab needed 

## 2013-02-27 NOTE — Assessment & Plan Note (Signed)
Unchanged. Patient re-educated about  the importance of commitment to a  minimum of 150 minutes of exercise per week. The importance of healthy food choices with portion control discussed. Encouraged to start a food diary, count calories and to consider  joining a support group. Sample diet sheets offered. Goals set by the patient for the next several months.    

## 2013-02-27 NOTE — Assessment & Plan Note (Signed)
Updated lab needed  Patient advised to reduce carb and sweets, commit to regular physical activity, take meds as prescribed, test blood as directed, and attempt to lose weight, to improve blood sugar control.  

## 2013-02-28 LAB — CBC WITH DIFFERENTIAL/PLATELET
Eosinophils Absolute: 0 10*3/uL (ref 0.0–0.7)
HCT: 35.5 % — ABNORMAL LOW (ref 36.0–46.0)
Lymphs Abs: 2.2 10*3/uL (ref 0.7–4.0)
MCH: 29.3 pg (ref 26.0–34.0)
MCV: 88.1 fL (ref 78.0–100.0)
Monocytes Absolute: 0.5 10*3/uL (ref 0.1–1.0)
Monocytes Relative: 9 % (ref 3–12)
Neutrophils Relative %: 48 % (ref 43–77)
WBC: 5.3 10*3/uL (ref 4.0–10.5)

## 2013-02-28 LAB — COMPLETE METABOLIC PANEL WITH GFR
Albumin: 4.1 g/dL (ref 3.5–5.2)
BUN: 12 mg/dL (ref 6–23)
CO2: 26 mEq/L (ref 19–32)
Calcium: 9.7 mg/dL (ref 8.4–10.5)
Chloride: 108 mEq/L (ref 96–112)
GFR, Est Non African American: 59 mL/min — ABNORMAL LOW
Glucose, Bld: 98 mg/dL (ref 70–99)
Potassium: 5.1 mEq/L (ref 3.5–5.3)

## 2013-02-28 LAB — LIPID PANEL
Cholesterol: 118 mg/dL (ref 0–200)
LDL Cholesterol: 62 mg/dL (ref 0–99)
Total CHOL/HDL Ratio: 3.1 Ratio
Triglycerides: 90 mg/dL (ref <150)
VLDL: 18 mg/dL (ref 0–40)

## 2013-03-22 NOTE — H&P (Deleted)
  NTS SOAP Note  Vital Signs:  Vitals as of: 03/22/2013: Systolic 172: Diastolic 98: Heart Rate 130: Temp 98.79F: Height 26ft 4in: Weight 224Lbs 0 Ounces: BMI 38.45  BMI : 38.45 kg/m2  Subjective: This 43 Years 70 Months old Female presents for of hemorrhoids.  States she has been having intermittent prolapse of tissue with occassional blood on toilet paper when she wipes herself, and soiled underwear after having bowel movement.  Denies constipation.  Last had a colonoscopy in 2008.  Review of Symptoms:  Constitutional:unremarkable   Head:unremarkable    Eyes:unremarkable   Nose/Mouth/Throat:unremarkable Cardiovascular:  unremarkable   Respiratory:unremarkable   Genitourinary:unremarkable     Musculoskeletal:unremarkable   Skin:unremarkable Hematolgic/Lymphatic:unremarkable     Allergic/Immunologic:unremarkable     Past Medical History:    Reviewed   Past Medical History  Surgical History: unremarkable Medical Problems: HTN, NIDDM, high cholesterol Allergies: sulfur Medications: norvasc, cetirizine, mevacor, glucopahge, prilosec, maxzide   Social History:Reviewed  Social History  Preferred Language: English Race:  Black or African American Ethnicity: Not Hispanic / Latino Age: 72 Years 0 Months Marital Status:  M Alcohol:  No Recreational drug(s):  No   Smoking Status: Never smoker reviewed on 03/22/2013 Functional Status reviewed on mm/dd/yyyy ------------------------------------------------ Bathing: Normal Cooking: Normal Dressing: Normal Driving: Normal Eating: Normal Managing Meds: Normal Oral Care: Normal Shopping: Normal Toileting: Normal Transferring: Normal Walking: Normal Cognitive Status reviewed on mm/dd/yyyy ------------------------------------------------ Attention: Normal Decision Making: Normal Language: Normal Memory: Normal Motor: Normal Perception: Normal Problem Solving: Normal Visual and  Spatial: Normal   Family History:  Reviewed  Family Health History Family History is Unknown    Objective Information: General:  Well appearing, well nourished in no distress. Heart:  RRR, no murmur Lungs:    CTA bilaterally, no wheezes, rhonchi, rales.  Breathing unlabored. Abdomen:Soft, NT/ND, no HSM, no masses.   Fair rectal tone, superficial rectal prolapse.  Assessment:Rectal prolapse  Diagnosis &amp; Procedure Smart Code   Plan:Scheduled for proctoplasty on 03/30/13.   Patient Education:Alternative treatments to surgery were discussed with patient (and family).  Risks and benefits  of procedure including bleeding, incontinence, and recurrence were fully explained to the patient (and family) who gave informed consent. Patient/family questions were addressed.  Follow-up:Pending Surgery

## 2013-03-22 NOTE — H&P (Signed)
  NTS SOAP Note  Vital Signs:  Vitals as of: 71/2014: Systolic 150: Diastolic 80: Heart Rate 81: Temp 97.49F: Height 48ft 5in: Weight 197Lbs 0 Ounces: BMI 33  BMI : 32.78 kg/m2  Subjective: This 72 Years old Female presents forsymptoms of a nodule in the right arm.  Has been present for many months.  Recent growth noted.  Nontender.  Denies any trauma to the right arm.  Denies arthropod bite.  Review of Symptoms:  Constitutional:unremarkable   Head:unremarkable    Eyes:unremarkable   Nose/Mouth/Throat:unremarkable Cardiovascular:  unremarkable   Respiratory:unremarkable   Gastrointestinal:  unremarkable   Genitourinary:unremarkable     Musculoskeletal:unremarkable   Hematolgic/Lymphatic:unremarkable     Allergic/Immunologic:unremarkable     Past Medical History:    Reviewed   Past Medical History  Surgical History:  Hysterectomy Medical Problems:  High Blood pressure, High cholesterol Allergies: nkda Medications: meloxidam, baby asa, pravastatin, clonidine, calcium   Social History:Reviewed  Social History  Age: 31 Years  Marital Status:  M Alcohol:  No Recreational drug(s):  No   Smoking Status: Never smoker reviewed on 10/21/2012 Functional Status reviewed on mm/dd/yyyy ------------------------------------------------ Bathing: Normal Cooking: Normal Dressing: Normal Driving: Normal Eating: Normal Managing Meds: Normal Oral Care: Normal Shopping: Normal Toileting: Normal Transferring: Normal Walking: Normal Cognitive Status reviewed on mm/dd/yyyy ------------------------------------------------ Attention: Normal Decision Making: Normal Language: Normal Memory: Normal Motor: Normal Perception: Normal Problem Solving: Normal Visual and Spatial: Normal   Family History:  Reviewed   Family History  Is there a family history [id number]    Objective Information: General:  Well appearing, well  nourished in no distress.      Ovoid mobile, hard soft tissue nodule in the right arm.  Mild resolving eccymoses noted.  No punctum, drainage noted.  No erythema noted.  Nontender.  Assessment:Soft tissue nodule, right arm, benign  Diagnosis &amp; Procedure:    Plan:Scheduled for excision of right arm neoplasm on 03/30/13.

## 2013-03-23 ENCOUNTER — Encounter (HOSPITAL_COMMUNITY): Payer: Self-pay

## 2013-03-24 NOTE — Patient Instructions (Addendum)
    Jillian [name]  03/24/2013   Your procedure is scheduled on:   03/30/2013  Report to [facility] at  700  AM.  Call this number if you have problems the morning of surgery: [phone]   Remember:   Do not eat food or drink liquids after midnight.   Take these medicines the morning of surgery with A SIP OF WATER:  Xanax, catapress   Do not wear jewelry, make-up or nail polish.  Do not wear lotions, powders, or perfumes.   Do not shave 48 hours prior to surgery. Men may shave face and neck.  Do not bring valuables to the hospital.  [facility] is not responsible for any belongings or valuables.  Contacts, dentures or bridgework may not be worn into surgery.  Leave suitcase in the car. After surgery it may be brought to your room.  For patients admitted to the hospital, checkout time is 11:00 AM the day of discharge.   Patients discharged the day of surgery will not be allowed to drive home.  Name and phone number of your driver: family  Special Instructions: Shower using CHG 2 nights before surgery and the night before surgery.  If you shower the day of surgery use CHG.  Use special wash - you have one bottle of CHG for all showers.  You should use approximately 1/3 of the bottle for each shower.   Please read over the following fact sheets that you were given: Pain Booklet, Coughing and Deep Breathing, Surgical Site Infection Prevention, Anesthesia Post-op Instructions and Care and Recovery After Surgery PATIENT INSTRUCTIONS POST-ANESTHESIA  IMMEDIATELY FOLLOWING SURGERY:  Do not drive or operate machinery for the first twenty four hours after surgery.  Do not make any important decisions for twenty four hours after surgery or while taking narcotic pain medications or sedatives.  If you develop intractable nausea and vomiting or a severe headache please notify your doctor immediately.  FOLLOW-UP:  Please make an appointment with your surgeon as instructed. You do not need to follow up with  anesthesia unless specifically instructed to do so.  WOUND CARE INSTRUCTIONS (if applicable):  Keep a dry clean dressing on the anesthesia/puncture wound site if there is drainage.  Once the wound has quit draining you may leave it open to air.  Generally you should leave the bandage intact for twenty four hours unless there is drainage.  If the epidural site drains for more than 36-48 hours please call the anesthesia department.  QUESTIONS?:  Please feel free to call your physician or the hospital operator if you have any questions, and they will be happy to assist you.

## 2013-03-28 ENCOUNTER — Other Ambulatory Visit: Payer: Self-pay

## 2013-03-28 ENCOUNTER — Encounter (HOSPITAL_COMMUNITY): Payer: Self-pay

## 2013-03-28 ENCOUNTER — Encounter (HOSPITAL_COMMUNITY)
Admission: RE | Admit: 2013-03-28 | Discharge: 2013-03-28 | Disposition: A | Discharge status: Home / Self Care | Payer: Medicare Other | Source: Ambulatory Visit | Attending: General Surgery | Admitting: General Surgery

## 2013-03-28 LAB — CBC WITH DIFFERENTIAL/PLATELET
Basophils Relative: 0 % (ref 0–1)
Eosinophils Absolute: 0 10*3/uL (ref 0.0–0.7)
Eosinophils Relative: 1 % (ref 0–5)
Hemoglobin: 12 g/dL (ref 12.0–15.0)
MCH: 30.5 pg (ref 26.0–34.0)
MCHC: 33.1 g/dL (ref 30.0–36.0)
Monocytes Relative: 9 % (ref 3–12)
Neutrophils Relative %: 49 % (ref 43–77)

## 2013-03-28 LAB — BASIC METABOLIC PANEL
BUN: 13 mg/dL (ref 6–23)
Calcium: 9.7 mg/dL (ref 8.4–10.5)
GFR calc non Af Amer: 58 mL/min — ABNORMAL LOW (ref >90)
Glucose, Bld: 117 mg/dL — ABNORMAL HIGH (ref 70–99)
Potassium: 4.2 mEq/L (ref 3.5–5.1)

## 2013-03-30 ENCOUNTER — Encounter (HOSPITAL_COMMUNITY)
Admission: RE | Disposition: A | Discharge status: Home / Self Care | Payer: Self-pay | Source: Ambulatory Visit | Attending: General Surgery

## 2013-03-30 ENCOUNTER — Ambulatory Visit (HOSPITAL_COMMUNITY)
Admission: RE | Admit: 2013-03-30 | Discharge: 2013-03-30 | Disposition: A | Discharge status: Home / Self Care | Payer: Medicare Other | Source: Ambulatory Visit | Attending: General Surgery | Admitting: General Surgery

## 2013-03-30 ENCOUNTER — Ambulatory Visit (HOSPITAL_COMMUNITY): Payer: Medicare Other | Admitting: Anesthesiology

## 2013-03-30 ENCOUNTER — Encounter (HOSPITAL_COMMUNITY): Payer: Self-pay | Admitting: Anesthesiology

## 2013-03-30 ENCOUNTER — Encounter (HOSPITAL_COMMUNITY): Payer: Self-pay | Admitting: *Deleted

## 2013-03-30 DIAGNOSIS — Z01810 Encounter for preprocedural cardiovascular examination: Secondary | ICD-10-CM | POA: Insufficient documentation

## 2013-03-30 DIAGNOSIS — E119 Type 2 diabetes mellitus without complications: Secondary | ICD-10-CM | POA: Insufficient documentation

## 2013-03-30 DIAGNOSIS — I10 Essential (primary) hypertension: Secondary | ICD-10-CM | POA: Insufficient documentation

## 2013-03-30 DIAGNOSIS — R229 Localized swelling, mass and lump, unspecified: Secondary | ICD-10-CM | POA: Insufficient documentation

## 2013-03-30 DIAGNOSIS — Z01812 Encounter for preprocedural laboratory examination: Secondary | ICD-10-CM | POA: Insufficient documentation

## 2013-03-30 DIAGNOSIS — Z79899 Other long term (current) drug therapy: Secondary | ICD-10-CM | POA: Insufficient documentation

## 2013-03-30 HISTORY — PX: LESION REMOVAL: SHX5196

## 2013-03-30 LAB — GLUCOSE, CAPILLARY
Glucose-Capillary: 136 mg/dL — ABNORMAL HIGH (ref 70–99)
Glucose-Capillary: 146 mg/dL — ABNORMAL HIGH (ref 70–99)

## 2013-03-30 SURGERY — WIDE EXCISION, LESION, UPPER EXTREMITY
Anesthesia: General | Site: Arm Upper | Laterality: Right | Wound class: Clean

## 2013-03-30 MED ORDER — LIDOCAINE HCL (PF) 1 % IJ SOLN
INTRAMUSCULAR | Status: AC
  Filled 2013-03-30: qty 5

## 2013-03-30 MED ORDER — DEXTROSE 50 % IV SOLN
12.5000 g | Freq: Once | INTRAVENOUS | Status: AC
  Administered 2013-03-30: 12.5 g via INTRAVENOUS

## 2013-03-30 MED ORDER — ONDANSETRON HCL 4 MG/2ML IJ SOLN
INTRAMUSCULAR | Status: AC
  Filled 2013-03-30: qty 2

## 2013-03-30 MED ORDER — FENTANYL CITRATE 0.05 MG/ML IJ SOLN: 25.0000 ug | INTRAMUSCULAR | Status: DC | PRN

## 2013-03-30 MED ORDER — DEXTROSE 50 % IV SOLN
INTRAVENOUS | Status: AC
  Filled 2013-03-30: qty 50

## 2013-03-30 MED ORDER — FENTANYL CITRATE 0.05 MG/ML IJ SOLN
INTRAMUSCULAR | Status: DC | PRN
  Administered 2013-03-30: 25 ug via INTRAVENOUS
  Administered 2013-03-30: 50 ug via INTRAVENOUS
  Administered 2013-03-30: 25 ug via INTRAVENOUS

## 2013-03-30 MED ORDER — KETOROLAC TROMETHAMINE 30 MG/ML IJ SOLN
INTRAMUSCULAR | Status: AC
  Filled 2013-03-30: qty 1

## 2013-03-30 MED ORDER — LACTATED RINGERS IV SOLN
INTRAVENOUS | Status: DC
  Administered 2013-03-30: 08:00:00 via INTRAVENOUS

## 2013-03-30 MED ORDER — BUPIVACAINE HCL (PF) 0.5 % IJ SOLN
INTRAMUSCULAR | Status: DC | PRN
  Administered 2013-03-30: 8 mL

## 2013-03-30 MED ORDER — LIDOCAINE HCL (CARDIAC) 20 MG/ML IV SOLN
INTRAVENOUS | Status: DC | PRN
  Administered 2013-03-30: 30 mg via INTRAVENOUS

## 2013-03-30 MED ORDER — PROPOFOL 10 MG/ML IV BOLUS
INTRAVENOUS | Status: DC | PRN
  Administered 2013-03-30: 150 mg via INTRAVENOUS

## 2013-03-30 MED ORDER — MIDAZOLAM HCL 2 MG/2ML IJ SOLN
INTRAMUSCULAR | Status: AC
  Filled 2013-03-30: qty 2

## 2013-03-30 MED ORDER — MIDAZOLAM HCL 2 MG/2ML IJ SOLN
1.0000 mg | INTRAMUSCULAR | Status: DC | PRN
  Administered 2013-03-30: 2 mg via INTRAVENOUS

## 2013-03-30 MED ORDER — BUPIVACAINE HCL (PF) 0.5 % IJ SOLN
INTRAMUSCULAR | Status: AC
  Filled 2013-03-30: qty 30

## 2013-03-30 MED ORDER — DEXTROSE 50 % IV SOLN
12.5000 mL | Freq: Once | INTRAVENOUS | Status: DC
  Administered 2013-03-30: 12.5 mL via INTRAVENOUS

## 2013-03-30 MED ORDER — FENTANYL CITRATE 0.05 MG/ML IJ SOLN
INTRAMUSCULAR | Status: AC
  Filled 2013-03-30: qty 2

## 2013-03-30 MED ORDER — CHLORHEXIDINE GLUCONATE 4 % EX LIQD
1.0000 | Freq: Once | CUTANEOUS | Status: DC
Start: 2013-03-30 — End: 2013-03-30

## 2013-03-30 MED ORDER — ONDANSETRON HCL 4 MG/2ML IJ SOLN
4.0000 mg | Freq: Once | INTRAMUSCULAR | Status: AC
  Administered 2013-03-30: 4 mg via INTRAVENOUS

## 2013-03-30 MED ORDER — SODIUM CHLORIDE 0.9 % IR SOLN
Status: DC | PRN
  Administered 2013-03-30: 200 mL/h

## 2013-03-30 MED ORDER — PROPOFOL 10 MG/ML IV EMUL
INTRAVENOUS | Status: AC
  Filled 2013-03-30: qty 20

## 2013-03-30 MED ORDER — KETOROLAC TROMETHAMINE 30 MG/ML IJ SOLN
30.0000 mg | Freq: Once | INTRAMUSCULAR | Status: AC
  Administered 2013-03-30: 30 mg via INTRAVENOUS

## 2013-03-30 MED ORDER — ONDANSETRON HCL 4 MG/2ML IJ SOLN: 4.0000 mg | Freq: Once | INTRAMUSCULAR | Status: DC | PRN

## 2013-03-30 SURGICAL SUPPLY — 35 items
BAG HAMPER (MISCELLANEOUS) ×2 IMPLANT
BENZOIN TINCTURE PRP APPL 2/3 (GAUZE/BANDAGES/DRESSINGS) ×2 IMPLANT
CLOTH BEACON ORANGE TIMEOUT ST (SAFETY) ×2 IMPLANT
COVER LIGHT HANDLE STERIS (MISCELLANEOUS) ×4 IMPLANT
DERMABOND ADVANCED (GAUZE/BANDAGES/DRESSINGS) ×1
DERMABOND ADVANCED .7 DNX12 (GAUZE/BANDAGES/DRESSINGS) ×1 IMPLANT
DRAPE EENT ADH APERT 15X15 STR (DRAPES) ×2 IMPLANT
DURAPREP 26ML APPLICATOR (WOUND CARE) ×2 IMPLANT
ELECT NEEDLE TIP 2.8 STRL (NEEDLE) ×2 IMPLANT
ELECT REM PT RETURN 9FT ADLT (ELECTROSURGICAL) ×2
ELECTRODE REM PT RTRN 9FT ADLT (ELECTROSURGICAL) ×1 IMPLANT
FORMALIN 10 PREFIL 120ML (MISCELLANEOUS) IMPLANT
GLOVE BIO SURGEON STRL SZ7.5 (GLOVE) ×2 IMPLANT
GLOVE BIOGEL PI IND STRL 7.0 (GLOVE) ×1 IMPLANT
GLOVE BIOGEL PI INDICATOR 7.0 (GLOVE) ×1
GLOVE OPTIFIT SS 6.5 STRL BRWN (GLOVE) ×2 IMPLANT
GOWN STRL REIN XL XLG (GOWN DISPOSABLE) ×4 IMPLANT
KIT ROOM TURNOVER APOR (KITS) ×2 IMPLANT
MANIFOLD NEPTUNE II (INSTRUMENTS) ×2 IMPLANT
NEEDLE HYPO 18GX1.5 BLUNT FILL (NEEDLE) IMPLANT
NEEDLE HYPO 25X1 1.5 SAFETY (NEEDLE) IMPLANT
NS IRRIG 1000ML POUR BTL (IV SOLUTION) ×2 IMPLANT
PACK MINOR (CUSTOM PROCEDURE TRAY) ×2 IMPLANT
PACK PERI GYN (CUSTOM PROCEDURE TRAY) IMPLANT
PAD ARMBOARD 7.5X6 YLW CONV (MISCELLANEOUS) ×2 IMPLANT
SET BASIN LINEN APH (SET/KITS/TRAYS/PACK) ×2 IMPLANT
SOL PREP PROV IODINE SCRUB 4OZ (MISCELLANEOUS) IMPLANT
STRIP CLOSURE SKIN 1/2X4 (GAUZE/BANDAGES/DRESSINGS) IMPLANT
SUT PROLENE 3 0 PS 1 (SUTURE) IMPLANT
SUT VIC AB 3-0 SH 27 (SUTURE)
SUT VIC AB 3-0 SH 27X BRD (SUTURE) IMPLANT
SUT VIC AB 4-0 PS2 27 (SUTURE) ×2 IMPLANT
SUT VIC AB 5-0 FS2 27 (SUTURE) ×2 IMPLANT
SYR BULB IRRIGATION 50ML (SYRINGE) IMPLANT
SYR CONTROL 10ML LL (SYRINGE) ×2 IMPLANT

## 2013-03-30 NOTE — Op Note (Signed)
Patient:  Jillian [name]  DOB:  March 25, 1941  MRN:  [id number]   Preop Diagnosis:  Skin neoplasm, right arm  Postop Diagnosis:  Same  Procedure:  Excision of 1.5 cm soft tissue neoplasm, right arm  Surgeon:  Franky Macho, M.D.  Anes:   general  Indications:  Patient is a 72 year old black female presents with a tender, enlarging right arm skin nodule in the subcutaneous region. The risks and benefits of the procedure were fully explained to the patient, who gave informed consent.  Procedure note:  The patient was placed the supine position. After general anesthesia was administered, the right arm was prepped and draped using usual sterile technique with DuraPrep. Surgical site confirmation was performed.  An elliptical incision was made over the subcutaneous nodule as there were some superficial skin changes present. The dissection was taken down to the mass which was not involving the muscle layer. The mass was removed and sent to pathology further examination. Any bleeding was controlled using Bovie electrocautery. 0.5% Sensorcaine was instilled the surrounding wound. The subcutaneous layer was reapproximated using a 5-0 Vicryl subcuticular suture. Dermabond was then applied.  All tape and needle counts were correct at the end of the procedure. The patient was awakened and transferred to PACU in stable condition.  Complications:  None  EBL:  Minimal  Specimen:  Skin neoplasm, right arm

## 2013-03-30 NOTE — Anesthesia Postprocedure Evaluation (Signed)
  Anesthesia Post-op Note  Patient: Jillian [name]  Procedure(s) Performed: Procedure(s): EXCISION OF NEOPLASM ARM (Right)  Patient Location: PACU  Anesthesia Type:General  Level of Consciousness: awake  Airway and Oxygen Therapy: Patient Spontanous Breathing and Patient connected to face mask oxygen  Post-op Pain: none  Post-op Assessment: Post-op Vital signs reviewed, Patient's Cardiovascular Status Stable, Respiratory Function Stable, Patent Airway and No signs of Nausea or vomiting  Post-op Vital Signs: Reviewed and stable  Complications: No apparent anesthesia complications

## 2013-03-30 NOTE — Interval H&P Note (Signed)
History and Physical Interval Note:  03/30/2013 8:21 AM  Jillian [name]  has presented today for surgery, with the diagnosis of neoplasm right arm  The various methods of treatment have been discussed with the patient and family. After consideration of risks, benefits and other options for treatment, the patient has consented to  Procedure(s): EXCISION OF NEOPLASM ARM (Right) as a surgical intervention .  The patient's history has been reviewed, patient examined, no change in status, stable for surgery.  I have reviewed the patient's chart and labs.  Questions were answered to the patient's satisfaction.     Franky Macho A

## 2013-03-30 NOTE — Anesthesia Procedure Notes (Signed)
Procedure Name: LMA Insertion Date/Time: 03/30/2013 8:43 AM Performed by: Glynn Octave E Pre-anesthesia Checklist: Patient identified, Patient being monitored, Emergency Drugs available, Timeout performed and Suction available Patient Re-evaluated:Patient Re-evaluated prior to inductionOxygen Delivery Method: Circle System Utilized Preoxygenation: Pre-oxygenation with 100% oxygen Intubation Type: IV induction Ventilation: Mask ventilation without difficulty LMA: LMA inserted LMA Size: 4.0 Number of attempts: 1 Placement Confirmation: positive ETCO2 and breath sounds checked- equal and bilateral Comments: Attempted a #3 LMA , but unable to obtain a good seal and inserted #4 with better results.

## 2013-03-30 NOTE — Anesthesia Preprocedure Evaluation (Signed)
Anesthesia Evaluation  Patient identified by MRN, date of birth, ID band Patient awake    Reviewed: Allergy & Precautions, H&P , NPO status , Patient's Chart, lab work & pertinent test results  Airway Mallampati: II TM Distance: >3 FB     Dental  (+) Teeth Intact   Pulmonary neg pulmonary ROS,  breath sounds clear to auscultation        Cardiovascular hypertension, Pt. on medications Rhythm:Regular Rate:Normal     Neuro/Psych    GI/Hepatic   Endo/Other  diabetes, Well Controlled, Type 2, Oral Hypoglycemic Agents  Renal/GU      Musculoskeletal   Abdominal   Peds  Hematology   Anesthesia Other Findings   Reproductive/Obstetrics                           Anesthesia Physical Anesthesia Plan  ASA: III  Anesthesia Plan: General   Post-op Pain Management:    Induction: Intravenous  Airway Management Planned: LMA  Additional Equipment:   Intra-op Plan:   Post-operative Plan: Extubation in OR  Informed Consent: I have reviewed the patients History and Physical, chart, labs and discussed the procedure including the risks, benefits and alternatives for the proposed anesthesia with the patient or authorized representative who has indicated his/her understanding and acceptance.     Plan Discussed with:   Anesthesia Plan Comments: (CBG=78, 1/2 amp D50W preop.)        Anesthesia Quick Evaluation

## 2013-03-30 NOTE — Transfer of Care (Signed)
Immediate Anesthesia Transfer of Care Note  Patient: Jillian [name]  Procedure(s) Performed: Procedure(s): EXCISION OF NEOPLASM ARM (Right)  Patient Location: PACU  Anesthesia Type:General  Level of Consciousness: awake  Airway & Oxygen Therapy: Patient Spontanous Breathing and Patient connected to face mask oxygen  Post-op Assessment: Report given to PACU RN  Post vital signs: Reviewed and stable  Complications: No apparent anesthesia complications

## 2013-04-01 ENCOUNTER — Encounter (HOSPITAL_COMMUNITY): Payer: Self-pay | Admitting: General Surgery

## 2013-05-04 ENCOUNTER — Ambulatory Visit (HOSPITAL_COMMUNITY)
Admission: RE | Admit: 2013-05-04 | Discharge: 2013-05-04 | Disposition: A | Discharge status: Home / Self Care | Payer: Medicare Other | Source: Ambulatory Visit | Attending: Family Medicine | Admitting: Family Medicine

## 2013-05-04 DIAGNOSIS — R928 Other abnormal and inconclusive findings on diagnostic imaging of breast: Secondary | ICD-10-CM

## 2013-05-04 DIAGNOSIS — N649 Disorder of breast, unspecified: Secondary | ICD-10-CM | POA: Insufficient documentation

## 2013-05-04 DIAGNOSIS — N63 Unspecified lump in unspecified breast: Secondary | ICD-10-CM | POA: Insufficient documentation

## 2013-06-07 HISTORY — PX: EYE SURGERY: SHX253

## 2013-06-27 ENCOUNTER — Other Ambulatory Visit: Payer: Self-pay | Admitting: Family Medicine

## 2013-06-29 ENCOUNTER — Emergency Department (HOSPITAL_COMMUNITY): Payer: Medicare Other

## 2013-06-29 ENCOUNTER — Emergency Department (HOSPITAL_COMMUNITY)
Admission: EM | Admit: 2013-06-29 | Discharge: 2013-06-29 | Disposition: A | Discharge status: Home / Self Care | Payer: Medicare Other | Attending: Emergency Medicine | Admitting: Emergency Medicine

## 2013-06-29 ENCOUNTER — Telehealth: Payer: Self-pay | Admitting: Family Medicine

## 2013-06-29 ENCOUNTER — Encounter (HOSPITAL_COMMUNITY): Payer: Self-pay | Admitting: Emergency Medicine

## 2013-06-29 DIAGNOSIS — Z79899 Other long term (current) drug therapy: Secondary | ICD-10-CM | POA: Insufficient documentation

## 2013-06-29 DIAGNOSIS — Z7982 Long term (current) use of aspirin: Secondary | ICD-10-CM | POA: Insufficient documentation

## 2013-06-29 DIAGNOSIS — E669 Obesity, unspecified: Secondary | ICD-10-CM | POA: Insufficient documentation

## 2013-06-29 DIAGNOSIS — M1610 Unilateral primary osteoarthritis, unspecified hip: Secondary | ICD-10-CM | POA: Insufficient documentation

## 2013-06-29 DIAGNOSIS — I10 Essential (primary) hypertension: Secondary | ICD-10-CM | POA: Insufficient documentation

## 2013-06-29 DIAGNOSIS — E119 Type 2 diabetes mellitus without complications: Secondary | ICD-10-CM | POA: Insufficient documentation

## 2013-06-29 DIAGNOSIS — M169 Osteoarthritis of hip, unspecified: Secondary | ICD-10-CM

## 2013-06-29 DIAGNOSIS — E785 Hyperlipidemia, unspecified: Secondary | ICD-10-CM | POA: Insufficient documentation

## 2013-06-29 MED ORDER — HYDROCODONE-ACETAMINOPHEN 5-325 MG PO TABS: 1.0000 | ORAL_TABLET | Freq: Four times a day (QID) | ORAL | Status: DC | PRN

## 2013-06-29 MED ORDER — METHOCARBAMOL 500 MG PO TABS
500.0000 mg | ORAL_TABLET | Freq: Once | ORAL | Status: AC
  Administered 2013-06-29: 500 mg via ORAL
  Filled 2013-06-29: qty 1

## 2013-06-29 MED ORDER — PREDNISONE 50 MG PO TABS: 60.0000 mg | ORAL_TABLET | Freq: Once | ORAL | Status: DC

## 2013-06-29 MED ORDER — METHOCARBAMOL 500 MG PO TABS: 500.0000 mg | ORAL_TABLET | Freq: Two times a day (BID) | ORAL | Status: DC

## 2013-06-29 MED ORDER — HYDROCODONE-ACETAMINOPHEN 5-325 MG PO TABS
2.0000 | ORAL_TABLET | Freq: Once | ORAL | Status: AC
  Administered 2013-06-29: 2 via ORAL
  Filled 2013-06-29: qty 2

## 2013-06-29 MED ORDER — ONDANSETRON HCL 4 MG PO TABS
4.0000 mg | ORAL_TABLET | Freq: Once | ORAL | Status: AC
  Administered 2013-06-29: 4 mg via ORAL
  Filled 2013-06-29: qty 1

## 2013-06-29 NOTE — Telephone Encounter (Signed)
noted 

## 2013-06-29 NOTE — ED Provider Notes (Signed)
Medical screening examination/treatment/procedure(s) were performed by non-physician practitioner and as supervising physician I was immediately available for consultation/collaboration.   Early Ord L Jakaden Ouzts, MD 06/29/13 1529 

## 2013-06-29 NOTE — ED Notes (Signed)
Pt states pain in rt leg started yesterday, nagging pain, pt states it started in her groin on the rt side and radiates down her rt leg. Denies injury or fall.

## 2013-06-29 NOTE — Telephone Encounter (Signed)
Patient states that she is having a sharp pain that started 10/7 that starts in right side and goes down leg.  She has decided to go to the ED.

## 2013-06-29 NOTE — ED Provider Notes (Signed)
CSN: [id number]     Arrival date & time 06/29/13  1152 History   First MD Initiated Contact with Patient 06/29/13 1211     Chief Complaint  Patient presents with  . Leg Pain    rt inner thigh   (Consider location/radiation/quality/duration/timing/severity/associated sxs/prior Treatment) Patient is a 72 y.o. female presenting with leg pain. The history is provided by the patient and a relative.  Leg Pain Location:  Hip Time since incident:  1 day Injury: no   Hip location:  L hip Pain details:    Quality:  Aching   Radiates to:  Groin   Severity:  Moderate   Onset quality:  Gradual   Timing:  Intermittent   Progression:  Worsening Chronicity:  New Dislocation: no   Foreign body present:  No foreign bodies Prior injury to area:  No Relieved by:  Nothing Worsened by:  Bearing weight Ineffective treatments:  Acetaminophen Associated symptoms: stiffness   Associated symptoms: no back pain and no neck pain     Past Medical History  Diagnosis Date  . Dyslipidemia   . Hypertension   . Mild obesity   . Postmenopausal   . DJD (degenerative joint disease)     of neck    . Diabetes mellitus   . Chronic fatigue    Past Surgical History  Procedure Laterality Date  . Left breast biopsy    . Appendectomy    . Abdominal hysterectomy    . Colonoscopy  12/22/2011    internal hemorrhoids  . Esophagogastroduodenoscopy  12/22/2011    diverticulosis mild left-side/gastritis  . Lesion removal Right 03/30/2013    Procedure: EXCISION OF NEOPLASM ARM;  Surgeon: Dalia Heading, MD;  Location: [facility];  Service: General;  Laterality: Right;   Family History  Problem Relation Age of Onset  . Heart failure Father   . Hypertension Father   . Hypertension Sister   . Hypertension Brother    History  Substance Use Topics  . Smoking status: Never Smoker   . Smokeless tobacco: Not on file  . Alcohol Use: No   OB History   Grav Para Term Preterm Abortions TAB SAB Ect Mult Living       Review of Systems  Constitutional: Negative for activity change.       All ROS Neg except as noted in HPI  HENT: Negative for nosebleeds.   Eyes: Negative for photophobia and discharge.  Respiratory: Negative for cough, shortness of breath and wheezing.   Cardiovascular: Negative for chest pain and palpitations.  Gastrointestinal: Negative for abdominal pain and blood in stool.  Genitourinary: Negative for dysuria, frequency and hematuria.  Musculoskeletal: Positive for arthralgias and stiffness. Negative for back pain and neck pain.  Skin: Negative.   Neurological: Negative for dizziness, seizures and speech difficulty.  Psychiatric/Behavioral: Negative for hallucinations and confusion.    Allergies  Codeine and Propoxyphene-acetaminophen  Home Medications   Current Outpatient Rx  Name  Route  Sig  Dispense  Refill  . acetaminophen (TYLENOL) 500 MG tablet   Oral   Take 1,000 mg by mouth every 6 (six) hours as needed. For pain         . aspirin EC 81 MG tablet   Oral   Take 81 mg by mouth daily.         . calcium-vitamin D (OSCAL 500/200 D-3) 500-200 MG-UNIT per tablet   Oral   Take 1 tablet by mouth at bedtime.          Marland Kitchen  cloNIDine (CATAPRES) 0.1 MG tablet      TAKE 1 TABLET BY MOUTH EVERY NIGHT AT BEDTIME   90 tablet   0     Generic For:*CATAPRES   0.1MG    . metFORMIN (GLUCOPHAGE) 500 MG tablet   Oral   Take 1,000 mg by mouth daily.         . pravastatin (PRAVACHOL) 20 MG tablet      TAKE 1 TABLET BY MOUTH EVERY EVENING   90 tablet   1   . Wild Yam 100 MG CAPS   Oral   Take 2 capsules by mouth 2 (two) times daily.         Marland Kitchen HYDROcodone-acetaminophen (NORCO/VICODIN) 5-325 MG per tablet   Oral   Take 1 tablet by mouth every 6 (six) hours as needed for pain.   15 tablet   0   . methocarbamol (ROBAXIN) 500 MG tablet   Oral   Take 1 tablet (500 mg total) by mouth 2 (two) times daily.   14 tablet   0    BP 122/77  Pulse 71  Temp(Src)  97.8 F (36.6 C) (Oral)  Resp 18  Ht 5\' 5"  (1.651 m)  Wt 196 lb (88.905 kg)  BMI 32.62 kg/m2  SpO2 98% Physical Exam  Nursing note and vitals reviewed. Constitutional: She is oriented to person, place, and time. She appears well-developed and well-nourished.  Non-toxic appearance.  HENT:  Head: Normocephalic.  Right Ear: Tympanic membrane and external ear normal.  Left Ear: Tympanic membrane and external ear normal.  Eyes: EOM and lids are normal. Pupils are equal, round, and reactive to light.  Neck: Normal range of motion. Neck supple. Carotid bruit is not present.  Cardiovascular: Normal rate, regular rhythm, normal heart sounds, intact distal pulses and normal pulses.   Pulmonary/Chest: Breath sounds normal. No respiratory distress.  Abdominal: Soft. Bowel sounds are normal. There is no tenderness. There is no guarding.  Musculoskeletal: She exhibits no edema.  Degenerative changes of both hands and knees. No deformity of the left  hip area. Good ROM noted.. No dislocation. No hot area. Mild inner thigh discomfort with attempted ROM. Distal pulses 1 + bilat. No edema. Neg Homan's sign.  Lymphadenopathy:       Head (right side): No submandibular adenopathy present.       Head (left side): No submandibular adenopathy present.    She has no cervical adenopathy.  Neurological: She is alert and oriented to person, place, and time. She has normal strength. No cranial nerve deficit or sensory deficit.  Skin: Skin is warm and dry.  Psychiatric: She has a normal mood and affect. Her speech is normal.    ED Course  Procedures (including critical care time) Labs Review Labs Reviewed - No data to display Imaging Review Dg Pelvis 1-2 Views  06/29/2013   CLINICAL DATA:  Left hip pain. No injury  EXAM: PELVIS - 1-2 VIEW  COMPARISON:  None.  FINDINGS: Symmetric increased sclerosis involving both sides of the pubic symphysis. No acute fracture, dislocation or other bone abnormalities noted.  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  IMPRESSION: 1. Osteitis pubis.  2. No acute findings.   Electronically Signed   By: Signa Kell M.D.   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 Pt initially denied any increase activity. Xray of the pelvis reveals sclerosis of the pubid symphysis, and djd of the hips. Test results given to the patient. Rx for norco and robaxin given to the patient with instructions to use caution getting around as this medication will cause drowsiness.  Kathie Dike, PA-C 06/29/13 1441

## 2013-06-29 NOTE — Telephone Encounter (Signed)
Please advise 

## 2013-07-13 ENCOUNTER — Other Ambulatory Visit: Payer: Self-pay | Admitting: Family Medicine

## 2013-07-13 DIAGNOSIS — Z09 Encounter for follow-up examination after completed treatment for conditions other than malignant neoplasm: Secondary | ICD-10-CM

## 2013-07-13 DIAGNOSIS — N6310 Unspecified lump in the right breast, unspecified quadrant: Secondary | ICD-10-CM

## 2013-07-13 LAB — BASIC METABOLIC PANEL
BUN: 11 mg/dL (ref 6–23)
CO2: 27 mEq/L (ref 19–32)
Calcium: 9.3 mg/dL (ref 8.4–10.5)
Chloride: 106 mEq/L (ref 96–112)
Creat: 0.96 mg/dL (ref 0.50–1.10)
Glucose, Bld: 113 mg/dL — ABNORMAL HIGH (ref 70–99)
Sodium: 140 mEq/L (ref 135–145)

## 2013-07-13 LAB — HEMOGLOBIN A1C: Hgb A1c MFr Bld: 6.6 % — ABNORMAL HIGH (ref <5.7)

## 2013-07-20 ENCOUNTER — Encounter: Payer: Self-pay | Admitting: Family Medicine

## 2013-07-20 ENCOUNTER — Ambulatory Visit (INDEPENDENT_AMBULATORY_CARE_PROVIDER_SITE_OTHER): Payer: Medicare Other | Admitting: Family Medicine

## 2013-07-20 VITALS — BP 124/82 | HR 75 | Resp 16 | Ht 65.0 in | Wt 195.1 lb

## 2013-07-20 DIAGNOSIS — E663 Overweight: Secondary | ICD-10-CM

## 2013-07-20 DIAGNOSIS — E119 Type 2 diabetes mellitus without complications: Secondary | ICD-10-CM

## 2013-07-20 DIAGNOSIS — Z23 Encounter for immunization: Secondary | ICD-10-CM

## 2013-07-20 DIAGNOSIS — I10 Essential (primary) hypertension: Secondary | ICD-10-CM

## 2013-07-20 DIAGNOSIS — E785 Hyperlipidemia, unspecified: Secondary | ICD-10-CM

## 2013-07-20 DIAGNOSIS — R229 Localized swelling, mass and lump, unspecified: Secondary | ICD-10-CM

## 2013-07-20 MED ORDER — PRAVASTATIN SODIUM 20 MG PO TABS: ORAL_TABLET | ORAL | Status: DC

## 2013-07-20 NOTE — Patient Instructions (Signed)
F/u end Feb, call if you need me before  Microalb, fasting lipid, cmp and EGFR, HBA1C and tSH in Feb before visit  Flu vaccine today  It is important that you exercise regularly at least 30 minutes 5 times a week. If you develop chest pain, have severe difficulty breathing, or feel very tired, stop exercising immediately and seek medical attention  A healthy diet is rich in fruit, vegetables and whole grains. Poultry fish, nuts and beans are a healthy choice for protein rather then red meat. A low sodium diet and drinking 64 ounces of water daily is generally recommended. Oils and sweet should be limited. Carbohydrates especially for those who are diabetic or overweight, should be limited to 34-45 gram per meal. It is important to eat on a regular schedule, at least 3 times daily. Snacks should be primarily fruits, vegetables or nuts.   Blood sugar and kidney function are good, no changes in medication

## 2013-07-20 NOTE — Assessment & Plan Note (Signed)
Controlled, no change in medication DASH diet and commitment to daily physical activity for a minimum of 30 minutes discussed and encouraged, as a part of hypertension management. The importance of attaining a healthy weight is also discussed.  

## 2013-07-20 NOTE — Assessment & Plan Note (Signed)
Controlled, no change in medication Hyperlipidemia:Low fat diet discussed and encouraged.  \ 

## 2013-07-20 NOTE — Progress Notes (Signed)
  Subjective:    Patient ID: Jillian [name], female    DOB: Apr 10, 1941, 72 y.o.   MRN: [id number]  HPI The PT is here for follow up and re-evaluation of chronic medical conditions, medication management and review of any available recent lab and radiology data.  Preventive health is updated, specifically  Cancer screening and Immunization.   Questions or concerns regarding consultations or procedures which the PT has had in the interim are  Addressed.Had right cataract extraction which has been succesful in September, left needs to be done. Had acute right hip pain and was in the ED last month, this has resolved with pain meds only The PT denies any adverse reactions to current medications since the last visit.  There are no new concerns.  There are no specific complaints       Review of Systems See HPI Denies recent fever or chills. Denies sinus pressure, nasal congestion, ear pain or sore throat. Denies chest congestion, productive cough or wheezing. Denies chest pains, palpitations and leg swelling Denies abdominal pain, nausea, vomiting,diarrhea or constipation.   Denies dysuria, frequency, hesitancy or incontinence. Denies joint pain, swelling and limitation in mobility. Denies headaches, seizures, numbness, or tingling. Denies depression, anxiety or insomnia. Denies skin break down or rash.        Objective:   Physical Exam  Patient alert and oriented and in no cardiopulmonary distress.  HEENT: No facial asymmetry, EOMI, no sinus tenderness,  oropharynx pink and moist.  Neck supple no adenopathy.  Chest: Clear to auscultation bilaterally.  CVS: S1, S2 no murmurs, no S3.  ABD: Soft non tender. Bowel sounds normal.  Ext: No edema  MS: Adequate ROM spine, shoulders, hips and knees.  Skin: Intact, no ulcerations or rash noted.  Psych: Good eye contact, normal affect. Memory intact not anxious or depressed appearing.  CNS: CN 2-12 intact, power, tone and  sensation normal throughout.       Assessment & Plan:

## 2013-07-20 NOTE — Assessment & Plan Note (Signed)
Improved. Pt applauded on succesful weight loss through lifestyle change, and encouraged to continue same. Weight loss goal set for the next several months.  

## 2013-07-20 NOTE — Assessment & Plan Note (Signed)
Controlled, no change in medication Patient advised to reduce carb and sweets, commit to regular physical activity, take meds as prescribed, test blood as directed, and attempt to lose weight, to improve blood sugar control.  

## 2013-07-27 ENCOUNTER — Other Ambulatory Visit: Payer: Self-pay | Admitting: Family Medicine

## 2013-08-01 ENCOUNTER — Other Ambulatory Visit: Payer: Self-pay

## 2013-08-10 ENCOUNTER — Telehealth: Payer: Self-pay | Admitting: Family Medicine

## 2013-08-10 DIAGNOSIS — Z1239 Encounter for other screening for malignant neoplasm of breast: Secondary | ICD-10-CM

## 2013-08-10 NOTE — Telephone Encounter (Signed)
pls call pt, her mammogram is past due , was due mid Nov, pls either schedule if she agrees  That you should , or let her schedule if she prefers to. She had an [place] of her breast earlier this year so she NEEDS to ave her mammogram this year. I will enter order if not already in, I think already ordered however

## 2013-08-17 ENCOUNTER — Encounter (HOSPITAL_COMMUNITY): Payer: Medicare Other

## 2013-08-20 ENCOUNTER — Encounter (HOSPITAL_COMMUNITY): Payer: Self-pay | Admitting: Emergency Medicine

## 2013-08-20 ENCOUNTER — Emergency Department (HOSPITAL_COMMUNITY)
Admission: EM | Admit: 2013-08-20 | Discharge: 2013-08-20 | Disposition: A | Discharge status: Home / Self Care | Payer: Medicare Other | Attending: Emergency Medicine | Admitting: Emergency Medicine

## 2013-08-20 DIAGNOSIS — H109 Unspecified conjunctivitis: Secondary | ICD-10-CM | POA: Insufficient documentation

## 2013-08-20 DIAGNOSIS — M1990 Unspecified osteoarthritis, unspecified site: Secondary | ICD-10-CM | POA: Insufficient documentation

## 2013-08-20 DIAGNOSIS — H53149 Visual discomfort, unspecified: Secondary | ICD-10-CM | POA: Insufficient documentation

## 2013-08-20 DIAGNOSIS — I10 Essential (primary) hypertension: Secondary | ICD-10-CM | POA: Insufficient documentation

## 2013-08-20 DIAGNOSIS — E785 Hyperlipidemia, unspecified: Secondary | ICD-10-CM | POA: Insufficient documentation

## 2013-08-20 DIAGNOSIS — E119 Type 2 diabetes mellitus without complications: Secondary | ICD-10-CM | POA: Insufficient documentation

## 2013-08-20 DIAGNOSIS — Z780 Asymptomatic menopausal state: Secondary | ICD-10-CM | POA: Insufficient documentation

## 2013-08-20 DIAGNOSIS — E669 Obesity, unspecified: Secondary | ICD-10-CM | POA: Insufficient documentation

## 2013-08-20 DIAGNOSIS — Z7982 Long term (current) use of aspirin: Secondary | ICD-10-CM | POA: Insufficient documentation

## 2013-08-20 DIAGNOSIS — Z79899 Other long term (current) drug therapy: Secondary | ICD-10-CM | POA: Insufficient documentation

## 2013-08-20 MED ORDER — GENTAMICIN SULFATE 0.3 % OP SOLN: 2.0000 [drp] | Freq: Four times a day (QID) | OPHTHALMIC | Status: DC

## 2013-08-20 NOTE — ED Provider Notes (Signed)
CSN: [id number]     Arrival date & time 08/20/13  1216 History  This chart was scribed for Jillian Lyons, MD,  by Ashley Jacobs, ED Scribe. The patient was seen in room [facility] and the patient's care was started at 1:17 PM.   First MD Initiated Contact with Patient 08/20/13 1315     Chief Complaint  Patient presents with  . Eye Pain   (Consider location/radiation/quality/duration/timing/severity/associated sxs/prior Treatment) The history is provided by the patient and medical records. No language interpreter was used.   HPI Comments: Jillian [name] is a 72 y.o. female who presents to the Emergency Department complaining of right eye irritation that presented yesterday without any injury.  Pt explains yesterday her eye presents with mild swelling however today upon waking her eye presents with edema and errythma.  She denies wearing contacts. Pt also denies sick contact. She denies visual disturbances. Pt had prior cataract surgurey. Past Medical History  Diagnosis Date  . Dyslipidemia   . Hypertension   . Mild obesity   . Postmenopausal   . DJD (degenerative joint disease)     of neck    . Diabetes mellitus   . Chronic fatigue    Past Surgical History  Procedure Laterality Date  . Left breast biopsy    . Appendectomy    . Abdominal hysterectomy    . Colonoscopy  12/22/2011    internal hemorrhoids  . Esophagogastroduodenoscopy  12/22/2011    diverticulosis mild left-side/gastritis  . Lesion removal Right 03/30/2013    Procedure: EXCISION OF NEOPLASM ARM;  Surgeon: Dalia Heading, MD;  Location: [facility];  Service: General;  Laterality: Right;  . Eye surgery Right 06/07/2013   Family History  Problem Relation Age of Onset  . Heart failure Father   . Hypertension Father   . Hypertension Sister   . Hypertension Brother    History  Substance Use Topics  . Smoking status: Never Smoker   . Smokeless tobacco: Not on file  . Alcohol Use: No   OB History   Grav Para Term  Preterm Abortions TAB SAB Ect Mult Living                 Review of Systems  Eyes: Positive for photophobia and pain. Negative for visual disturbance.    Allergies  Codeine and Propoxyphene-acetaminophen  Home Medications   Current Outpatient Rx  Name  Route  Sig  Dispense  Refill  . acetaminophen (TYLENOL) 500 MG tablet   Oral   Take 1,000 mg by mouth every 6 (six) hours as needed. For pain         . aspirin EC 81 MG tablet   Oral   Take 81 mg by mouth daily.         . calcium-vitamin D (OSCAL 500/200 D-3) 500-200 MG-UNIT per tablet   Oral   Take 1 tablet by mouth at bedtime.          . cloNIDine (CATAPRES) 0.1 MG tablet      TAKE 1 TABLET BY MOUTH EVERY NIGHT AT BEDTIME   90 tablet   0     Generic For:*CATAPRES   0.1MG    . HYDROcodone-acetaminophen (NORCO/VICODIN) 5-325 MG per tablet   Oral   Take 1 tablet by mouth every 6 (six) hours as needed for pain.   15 tablet   0   . metFORMIN (GLUCOPHAGE-XR) 500 MG 24 hr tablet      TAKE 1 TABLET BY MOUTH TWICE  DAILY   180 tablet   0     Generic [id number]  XR 500MG    . pravastatin (PRAVACHOL) 20 MG tablet      TAKE 1 TABLET BY MOUTH EVERY EVENING   90 tablet   1    BP 148/82  Temp(Src) 97.4 F (36.3 C) (Oral)  Resp 20  Ht 5\' 5"  (1.651 m)  Wt 198 lb (89.812 kg)  BMI 32.95 kg/m2  SpO2 100% Physical Exam  Nursing note and vitals reviewed. Constitutional: She is oriented to person, place, and time. She appears well-developed and well-nourished. No distress.  HENT:  Head: Normocephalic and atraumatic.  Inject conjunctiva No foreign body under the lid No obvious cornea abrasion  Pupil equal round and reactive  Eyes: EOM are normal. Pupils are equal, round, and reactive to light.  Neck: Normal range of motion. Neck supple. No tracheal deviation present.  Cardiovascular: Normal rate.   Pulmonary/Chest: Effort normal. No respiratory distress.  Abdominal: Soft. She exhibits no distension.   Musculoskeletal: Normal range of motion.  Neurological: She is alert and oriented to person, place, and time.  Skin: Skin is warm and dry.  Psychiatric: She has a normal mood and affect. Her behavior is normal.    ED Course  Procedures (including critical care time) DIAGNOSTIC STUDIES: Oxygen Saturation is 100% on room air, normal by my interpretation.    COORDINATION OF CARE: 1:21 PM Discussed course of care with pt . Pt understands and agrees.  Labs Review Labs Reviewed - No data to display Imaging Review No results found.    MDM  No diagnosis found. Asian presents with a red, irritated right eye. She denies any injury or trauma but is concerned she may have scratched it in her sleep. Physical examination reveals an injected conjunctiva however no obvious corneal abrasions or foreign bodies are noted. The pupil is equal and is reactive. This appears to be a conjunctivitis which will be treated with gentamicin ophthalmic solution. She is to return as needed if her symptoms do not improve or worsen.    I personally performed the services described in this documentation, which was scribed in my presence. The recorded information has been reviewed and is accurate.      Jillian Lyons, MD 08/23/13 1106

## 2013-08-20 NOTE — ED Notes (Signed)
Pt c/o r eye pain, watery, and redness since last night.  Denies injury.

## 2013-09-14 HISTORY — PX: EYE SURGERY: SHX253

## 2013-10-24 ENCOUNTER — Encounter: Payer: Self-pay | Admitting: Family Medicine

## 2013-10-24 ENCOUNTER — Ambulatory Visit (INDEPENDENT_AMBULATORY_CARE_PROVIDER_SITE_OTHER): Payer: Medicare HMO | Admitting: Family Medicine

## 2013-10-24 ENCOUNTER — Encounter (INDEPENDENT_AMBULATORY_CARE_PROVIDER_SITE_OTHER): Payer: Self-pay

## 2013-10-24 VITALS — BP 140/80 | HR 88 | Resp 18 | Ht 65.0 in | Wt 199.1 lb

## 2013-10-24 DIAGNOSIS — E119 Type 2 diabetes mellitus without complications: Secondary | ICD-10-CM

## 2013-10-24 DIAGNOSIS — N760 Acute vaginitis: Secondary | ICD-10-CM

## 2013-10-24 DIAGNOSIS — M549 Dorsalgia, unspecified: Secondary | ICD-10-CM | POA: Insufficient documentation

## 2013-10-24 DIAGNOSIS — N951 Menopausal and female climacteric states: Secondary | ICD-10-CM | POA: Insufficient documentation

## 2013-10-24 DIAGNOSIS — G4700 Insomnia, unspecified: Secondary | ICD-10-CM

## 2013-10-24 DIAGNOSIS — I10 Essential (primary) hypertension: Secondary | ICD-10-CM

## 2013-10-24 DIAGNOSIS — E785 Hyperlipidemia, unspecified: Secondary | ICD-10-CM

## 2013-10-24 MED ORDER — VENLAFAXINE HCL ER 37.5 MG PO CP24: 37.5000 mg | ORAL_CAPSULE | Freq: Every day | ORAL | Status: DC

## 2013-10-24 MED ORDER — IBUPROFEN 600 MG PO TABS: ORAL_TABLET | ORAL | Status: DC

## 2013-10-24 MED ORDER — PREDNISONE 5 MG PO TABS: 5.0000 mg | ORAL_TABLET | Freq: Two times a day (BID) | ORAL | Status: AC

## 2013-10-24 MED ORDER — BENAZEPRIL HCL 10 MG PO TABS: 10.0000 mg | ORAL_TABLET | Freq: Every day | ORAL | Status: DC

## 2013-10-24 NOTE — Patient Instructions (Addendum)
F/u in March as before, March 2 at 8:15 am  Specimens are sent today, you will be called with results  Microalb from office today  Fasting lipid, cmp and EGFR, hBA1C end Feb approx 3 to 5 days before vist  PLS Bring all med BOTTLES to next visit, this is for your safety  New for BP and kidney protection is benazepril one daily  New for hot flashes is venlefaxine  1 daily  For back pain, toradol and depo medrol administered in office , and ibuprofen and prednisone are sent to your pharmacy in [place]  Start all new medication tomorrow please 

## 2013-10-25 ENCOUNTER — Other Ambulatory Visit (HOSPITAL_COMMUNITY)
Admission: RE | Admit: 2013-10-25 | Discharge: 2013-10-25 | Disposition: A | Discharge status: Home / Self Care | Payer: Medicare HMO | Source: Ambulatory Visit | Attending: Family Medicine | Admitting: Family Medicine

## 2013-10-25 DIAGNOSIS — Z113 Encounter for screening for infections with a predominantly sexual mode of transmission: Secondary | ICD-10-CM | POA: Insufficient documentation

## 2013-10-25 DIAGNOSIS — N760 Acute vaginitis: Secondary | ICD-10-CM | POA: Insufficient documentation

## 2013-10-25 DIAGNOSIS — M549 Dorsalgia, unspecified: Secondary | ICD-10-CM

## 2013-10-25 MED ORDER — KETOROLAC TROMETHAMINE 60 MG/2ML IM SOLN
60.0000 mg | Freq: Once | INTRAMUSCULAR | Status: AC
  Administered 2013-10-25: 60 mg via INTRAMUSCULAR

## 2013-10-25 MED ORDER — METHYLPREDNISOLONE ACETATE 80 MG/ML IJ SUSP
80.0000 mg | Freq: Once | INTRAMUSCULAR | Status: AC
  Administered 2013-10-25: 80 mg via INTRAMUSCULAR

## 2013-10-27 ENCOUNTER — Other Ambulatory Visit: Payer: Self-pay | Admitting: Family Medicine

## 2013-10-28 LAB — MICROALBUMIN / CREATININE URINE RATIO
CREATININE, URINE: 471.4 mg/dL
Microalb Creat Ratio: 4.7 mg/g (ref 0.0–30.0)
Microalb, Ur: 2.23 mg/dL — ABNORMAL HIGH (ref 0.00–1.89)

## 2013-11-02 ENCOUNTER — Encounter (HOSPITAL_COMMUNITY): Payer: Medicare Other

## 2013-11-09 ENCOUNTER — Encounter (HOSPITAL_COMMUNITY): Payer: Medicare Other

## 2013-11-12 DIAGNOSIS — N760 Acute vaginitis: Secondary | ICD-10-CM | POA: Insufficient documentation

## 2013-11-12 NOTE — Assessment & Plan Note (Signed)
Worsened and uncontrolled  Due to excesive night sweats , will try to control these better

## 2013-11-12 NOTE — Assessment & Plan Note (Signed)
Updated lab needed at/ before next visit. Low HDL needs to commit to daily exercise to improve this

## 2013-11-12 NOTE — Assessment & Plan Note (Signed)
Increased and unbearable will try effexor , lifestyle modification is of little benefit she reports very disturbed sleep

## 2013-11-12 NOTE — Progress Notes (Signed)
   Subjective:    Patient ID: Jillian [name], female    DOB: 1941-02-17, 73 y.o.   MRN: [id number]  HPI 1 day h/o excessive vaginal itch, has not noted increased vag d/c and is not currently sexually active spouse is battling prostate cancer. 5 day h/o increased low back pain radiating to buttock, no specific aggravating trigger , has known disc disease, no red flag symptoms in lower extremities , no associated incontinenence. C/o worsening insomnia due to marked hot flashes, wants help for this Exercise approx 3 times weekly and is trying to modify diet to a healthy one    Review of Systems See HPI Denies recent fever or chills. Denies sinus pressure, nasal congestion, ear pain or sore throat. Denies chest congestion, productive cough or wheezing. Denies chest pains, palpitations and leg swelling Denies abdominal pain, nausea, vomiting,diarrhea or constipation.   Denies dysuria, frequency, hesitancy or incontinence.  Denies headaches, seizures, numbness, or tingling. Denies depression or  Anxiety does haver insomnia. Denies skin break down or rash.        Objective:   Physical Exam BP 140/80  Pulse 88  Resp 18  Ht 5\' 5"  (1.651 m)  Wt 199 lb 1.3 oz (90.302 kg)  BMI 33.13 kg/m2  SpO2 98% Patient alert and oriented and in no cardiopulmonary distress.  HEENT: No facial asymmetry, EOMI, no sinus tenderness,  oropharynx pink and moist.  Neck supple no adenopathy.  Chest: Clear to auscultation bilaterally.  CVS: S1, S2 no murmurs, no S3.  ABD: Soft non tender. Bowel sounds normal. Pelvic: no visible vaginal d/c , vaginal walls dry, no ulcers noted in vagina or at introitus. Uterus absent, no palpable adnexal masses Ext: No edema  MS: Adequate ROM spine, shoulders, hips and knees.  Skin: Intact, no ulcerations or rash noted.  Psych: Good eye contact, normal affect. Memory intact not anxious or depressed appearing.  CNS: CN 2-12 intact, power, tone and sensation normal  throughout.        Assessment & Plan:  Back pain with radiation Acute flare of back pain , depo medrol in office followed by short course of prednisone  Menopausal syndrome (hot flashes) Increased and unbearable will try effexor , lifestyle modification is of little benefit she reports very disturbed sleep  Vaginitis and vulvovaginitis Symptomatic x 1 day, no significant d/c on exam, vaginal wall atrophic liklely cause is low estrogen, specimen sent for testing will treat based on result  INSOMNIA, CHRONIC Worsened and uncontrolled  Due to excesive night sweats , will try to control these better  DIABETES MELLITUS, TYPE II Controlled, no change in medication Patient advised to reduce carb and sweets, commit to regular physical activity, take meds as prescribed, test blood as directed, and attempt to lose weight, to improve blood sugar control. Updated lab needed at/ before next visit. Add ACE for renal protection  ESSENTIAL HYPERTENSION, BENIGN Controlled, no change in medication DASH diet and commitment to daily physical activity for a minimum of 30 minutes discussed and encouraged, as a part of hypertension management. The importance of attaining a healthy weight is also discussed.   Hyperlipidemia LDL goal < 100 Updated lab needed at/ before next visit. Low HDL needs to commit to daily exercise to improve this

## 2013-11-12 NOTE — Assessment & Plan Note (Signed)
Controlled, no change in medication Patient advised to reduce carb and sweets, commit to regular physical activity, take meds as prescribed, test blood as directed, and attempt to lose weight, to improve blood sugar control. Updated lab needed at/ before next visit. Add ACE for renal protection

## 2013-11-12 NOTE — Assessment & Plan Note (Signed)
Symptomatic x 1 day, no significant d/c on exam, vaginal wall atrophic liklely cause is low estrogen, specimen sent for testing will treat based on result

## 2013-11-12 NOTE — Assessment & Plan Note (Signed)
Acute flare of back pain , depo medrol in office followed by short course of prednisone

## 2013-11-12 NOTE — Assessment & Plan Note (Signed)
Controlled, no change in medication DASH diet and commitment to daily physical activity for a minimum of 30 minutes discussed and encouraged, as a part of hypertension management. The importance of attaining a healthy weight is also discussed.  

## 2013-11-16 ENCOUNTER — Other Ambulatory Visit: Payer: Self-pay | Admitting: Family Medicine

## 2013-11-16 ENCOUNTER — Ambulatory Visit (HOSPITAL_COMMUNITY)
Admission: RE | Admit: 2013-11-16 | Discharge: 2013-11-16 | Disposition: A | Discharge status: Home / Self Care | Payer: Medicare HMO | Source: Ambulatory Visit | Attending: Family Medicine | Admitting: Family Medicine

## 2013-11-16 DIAGNOSIS — N63 Unspecified lump in unspecified breast: Secondary | ICD-10-CM | POA: Insufficient documentation

## 2013-11-16 DIAGNOSIS — N6310 Unspecified lump in the right breast, unspecified quadrant: Secondary | ICD-10-CM

## 2013-11-16 DIAGNOSIS — Z09 Encounter for follow-up examination after completed treatment for conditions other than malignant neoplasm: Secondary | ICD-10-CM | POA: Insufficient documentation

## 2013-11-16 LAB — HEMOGLOBIN A1C
Hgb A1c MFr Bld: 6.2 % — ABNORMAL HIGH (ref <5.7)
MEAN PLASMA GLUCOSE: 131 mg/dL — AB (ref <117)

## 2013-11-16 LAB — COMPLETE METABOLIC PANEL WITH GFR
ALK PHOS: 77 U/L (ref 39–117)
ALT: 12 U/L (ref 0–35)
AST: 13 U/L (ref 0–37)
Albumin: 4.3 g/dL (ref 3.5–5.2)
BUN: 13 mg/dL (ref 6–23)
CO2: 28 meq/L (ref 19–32)
Calcium: 9.3 mg/dL (ref 8.4–10.5)
Chloride: 104 mEq/L (ref 96–112)
Creat: 1.05 mg/dL (ref 0.50–1.10)
GFR, EST AFRICAN AMERICAN: 62 mL/min
GFR, EST NON AFRICAN AMERICAN: 54 mL/min — AB
Glucose, Bld: 101 mg/dL — ABNORMAL HIGH (ref 70–99)
Potassium: 4.3 mEq/L (ref 3.5–5.3)
SODIUM: 142 meq/L (ref 135–145)
TOTAL PROTEIN: 7.2 g/dL (ref 6.0–8.3)
Total Bilirubin: 0.5 mg/dL (ref 0.2–1.2)

## 2013-11-16 LAB — LIPID PANEL
CHOL/HDL RATIO: 3 ratio
Cholesterol: 136 mg/dL (ref 0–200)
HDL: 45 mg/dL (ref >39)
LDL CALC: 69 mg/dL (ref 0–99)
Triglycerides: 112 mg/dL (ref <150)
VLDL: 22 mg/dL (ref 0–40)

## 2013-11-21 ENCOUNTER — Ambulatory Visit: Payer: Medicare Other | Admitting: Family Medicine

## 2013-12-21 ENCOUNTER — Other Ambulatory Visit: Payer: Self-pay | Admitting: Family Medicine

## 2014-01-13 ENCOUNTER — Encounter: Payer: Self-pay | Admitting: Family Medicine

## 2014-01-13 ENCOUNTER — Ambulatory Visit (INDEPENDENT_AMBULATORY_CARE_PROVIDER_SITE_OTHER): Payer: Medicare HMO | Admitting: Family Medicine

## 2014-01-13 ENCOUNTER — Ambulatory Visit (HOSPITAL_COMMUNITY)
Admission: RE | Admit: 2014-01-13 | Discharge: 2014-01-13 | Disposition: A | Discharge status: Home / Self Care | Payer: Medicare HMO | Source: Ambulatory Visit | Attending: Family Medicine | Admitting: Family Medicine

## 2014-01-13 VITALS — BP 130/80 | HR 92 | Resp 16 | Ht 65.0 in | Wt 191.8 lb

## 2014-01-13 DIAGNOSIS — M25519 Pain in unspecified shoulder: Secondary | ICD-10-CM | POA: Insufficient documentation

## 2014-01-13 DIAGNOSIS — M25511 Pain in right shoulder: Secondary | ICD-10-CM | POA: Insufficient documentation

## 2014-01-13 DIAGNOSIS — E663 Overweight: Secondary | ICD-10-CM

## 2014-01-13 DIAGNOSIS — E119 Type 2 diabetes mellitus without complications: Secondary | ICD-10-CM

## 2014-01-13 DIAGNOSIS — I10 Essential (primary) hypertension: Secondary | ICD-10-CM

## 2014-01-13 DIAGNOSIS — N951 Menopausal and female climacteric states: Secondary | ICD-10-CM

## 2014-01-13 DIAGNOSIS — Z1211 Encounter for screening for malignant neoplasm of colon: Secondary | ICD-10-CM

## 2014-01-13 DIAGNOSIS — G4700 Insomnia, unspecified: Secondary | ICD-10-CM

## 2014-01-13 LAB — POC HEMOCCULT BLD/STL (OFFICE/1-CARD/DIAGNOSTIC): Fecal Occult Blood, POC: NEGATIVE

## 2014-01-13 MED ORDER — METHYLPREDNISOLONE ACETATE 80 MG/ML IJ SUSP
80.0000 mg | Freq: Once | INTRAMUSCULAR | Status: AC
  Administered 2014-01-13: 80 mg via INTRAMUSCULAR

## 2014-01-13 MED ORDER — CLONIDINE HCL 0.1 MG PO TABS: ORAL_TABLET | ORAL | Status: DC

## 2014-01-13 MED ORDER — IBUPROFEN 600 MG PO TABS: ORAL_TABLET | ORAL | Status: DC

## 2014-01-13 MED ORDER — KETOROLAC TROMETHAMINE 60 MG/2ML IM SOLN
60.0000 mg | Freq: Once | INTRAMUSCULAR | Status: AC
  Administered 2014-01-13: 60 mg via INTRAMUSCULAR

## 2014-01-13 MED ORDER — PREDNISONE (PAK) 5 MG PO TABS: 5.0000 mg | ORAL_TABLET | ORAL | Status: DC

## 2014-01-13 MED ORDER — VENLAFAXINE HCL ER 37.5 MG PO CP24: ORAL_CAPSULE | ORAL | Status: DC

## 2014-01-13 MED ORDER — METFORMIN HCL ER 500 MG PO TB24: ORAL_TABLET | ORAL | Status: DC

## 2014-01-13 NOTE — Patient Instructions (Addendum)
Annual wellness in 4.5 months, call if you need me before  You are treated for bursitis of right shoulder, please get x ray of right shoulder. Injections in office today and medication sent in. Call back if you continue to have pain  Rectal exam today is normal  Labs are very good  hBA1C , chem 7 and EGFR in 4.5 month  Congrats on weight loss, please keep it up

## 2014-01-14 NOTE — Progress Notes (Signed)
   Subjective:    Patient ID: Jillian [name], female    DOB: November 17, 1940, 73 y.o.   MRN: [id number]  HPI The PT is here for follow up and re-evaluation of chronic medical conditions, medication management and review of any available recent lab and radiology data.  Preventive health is updated, specifically  Cancer screening and Immunization.   . The PT denies any adverse reactions to current medications since the last visit. Hot flashes are finally controlled on mediation and sleep improved. She has worked on weight loss through change in diet with good success. C/o 3 month h/o progressive right shoulder pain adn reduced mobility, pain worsened by direct pressure on the joint, no h/o trauma      Review of Systems See HPI Denies recent fever or chills. Denies sinus pressure, nasal congestion, ear pain or sore throat. Denies chest congestion, productive cough or wheezing. Denies chest pains, palpitations and leg swelling Denies abdominal pain, nausea, vomiting,diarrhea or constipation.   Denies dysuria, frequency, hesitancy or incontinence. Denies headaches, seizures, numbness, or tingling. Denies depression, anxiety or insomnia. Denies skin break down or rash.        Objective:   Physical Exam BP 130/80  Pulse 92  Resp 16  Ht 5\' 5"  (1.651 m)  Wt 191 lb 12.8 oz (87 kg)  BMI 31.92 kg/m2  SpO2 96% Patient alert and oriented and in no cardiopulmonary distress.  HEENT: No facial asymmetry, EOMI, no sinus tenderness,  oropharynx pink and moist.  Neck supple no adenopathy.  Chest: Clear to auscultation bilaterally.  CVS: S1, S2 no murmurs, no S3.  ABD: Soft non tender. Bowel sounds normal. Rectal: no mass, heme negative stool Ext: No edema  MS: Adequate ROM spine,  hips and knees.decreased ROM right shoulder  Skin: Intact, no ulcerations or rash noted.  Psych: Good eye contact, normal affect. Memory intact not anxious or depressed appearing.  CNS: CN 2-12 intact,  power, tone and sensation normal throughout.        Assessment & Plan:  Shoulder pain, right Worsening pain x 3 months, likely bursitis, will obtain xray of shoulder and treat with aggressive anti inflammatory course if no improvement will need to see orhjto  Menopausal syndrome (hot flashes) Improved and controlled on current meds  OBESITY, MILD Improved. Pt applauded on succesful weight loss through lifestyle change, and encouraged to continue same. Weight loss goal set for the next several months.   INSOMNIA, CHRONIC Sleep hygiene reviewed and written information offered also. Prescription sent for  medication needed. Controlled, no change in medication   DIABETES MELLITUS, TYPE II Controlled, no change in medication DASH diet and commitment to daily physical activity for a minimum of 30 minutes discussed and encouraged, as a part of hypertension management. The importance of attaining a healthy weight is also discussed.   ESSENTIAL HYPERTENSION, BENIGN Controlled, no change in medication DASH diet and commitment to daily physical activity for a minimum of 30 minutes discussed and encouraged, as a part of hypertension management. The importance of attaining a healthy weight is also discussed.

## 2014-01-14 NOTE — Assessment & Plan Note (Signed)
Controlled, no change in medication DASH diet and commitment to daily physical activity for a minimum of 30 minutes discussed and encouraged, as a part of hypertension management. The importance of attaining a healthy weight is also discussed.  

## 2014-01-14 NOTE — Assessment & Plan Note (Signed)
Improved. Pt applauded on succesful weight loss through lifestyle change, and encouraged to continue same. Weight loss goal set for the next several months.  

## 2014-01-14 NOTE — Assessment & Plan Note (Signed)
Worsening pain x 3 months, likely bursitis, will obtain xray of shoulder and treat with aggressive anti inflammatory course if no improvement will need to see orhjto

## 2014-01-14 NOTE — Assessment & Plan Note (Signed)
Sleep hygiene reviewed and written information offered also. Prescription sent for  medication needed. Controlled, no change in medication  

## 2014-01-14 NOTE — Assessment & Plan Note (Signed)
Improved and controlled on current meds

## 2014-01-22 ENCOUNTER — Emergency Department (HOSPITAL_COMMUNITY)
Admission: EM | Admit: 2014-01-22 | Discharge: 2014-01-22 | Disposition: A | Discharge status: Home / Self Care | Payer: Medicare HMO | Attending: Emergency Medicine | Admitting: Emergency Medicine

## 2014-01-22 ENCOUNTER — Encounter (HOSPITAL_COMMUNITY): Payer: Self-pay | Admitting: Emergency Medicine

## 2014-01-22 ENCOUNTER — Emergency Department (HOSPITAL_COMMUNITY): Payer: Medicare HMO

## 2014-01-22 DIAGNOSIS — M7530 Calcific tendinitis of unspecified shoulder: Secondary | ICD-10-CM | POA: Insufficient documentation

## 2014-01-22 DIAGNOSIS — E669 Obesity, unspecified: Secondary | ICD-10-CM | POA: Insufficient documentation

## 2014-01-22 DIAGNOSIS — R5381 Other malaise: Secondary | ICD-10-CM | POA: Insufficient documentation

## 2014-01-22 DIAGNOSIS — E119 Type 2 diabetes mellitus without complications: Secondary | ICD-10-CM | POA: Insufficient documentation

## 2014-01-22 DIAGNOSIS — Z79899 Other long term (current) drug therapy: Secondary | ICD-10-CM | POA: Insufficient documentation

## 2014-01-22 DIAGNOSIS — E785 Hyperlipidemia, unspecified: Secondary | ICD-10-CM | POA: Insufficient documentation

## 2014-01-22 DIAGNOSIS — M7532 Calcific tendinitis of left shoulder: Secondary | ICD-10-CM

## 2014-01-22 DIAGNOSIS — R5383 Other fatigue: Secondary | ICD-10-CM

## 2014-01-22 DIAGNOSIS — I10 Essential (primary) hypertension: Secondary | ICD-10-CM | POA: Insufficient documentation

## 2014-01-22 DIAGNOSIS — M7150 Other bursitis, not elsewhere classified, unspecified site: Secondary | ICD-10-CM | POA: Insufficient documentation

## 2014-01-22 DIAGNOSIS — Z7982 Long term (current) use of aspirin: Secondary | ICD-10-CM | POA: Insufficient documentation

## 2014-01-22 MED ORDER — ONDANSETRON HCL 4 MG PO TABS
4.0000 mg | ORAL_TABLET | Freq: Once | ORAL | Status: AC
  Administered 2014-01-22: 4 mg via ORAL
  Filled 2014-01-22: qty 1

## 2014-01-22 MED ORDER — HYDROCODONE-ACETAMINOPHEN 5-325 MG PO TABS: 1.0000 | ORAL_TABLET | ORAL | Status: DC | PRN

## 2014-01-22 MED ORDER — DEXAMETHASONE SODIUM PHOSPHATE 4 MG/ML IJ SOLN
8.0000 mg | Freq: Once | INTRAMUSCULAR | Status: AC
  Administered 2014-01-22: 8 mg via INTRAMUSCULAR
  Filled 2014-01-22: qty 2

## 2014-01-22 MED ORDER — DEXAMETHASONE 6 MG PO TABS: ORAL_TABLET | ORAL | Status: DC

## 2014-01-22 MED ORDER — HYDROCODONE-ACETAMINOPHEN 5-325 MG PO TABS
1.0000 | ORAL_TABLET | Freq: Once | ORAL | Status: AC
  Administered 2014-01-22: 1 via ORAL
  Filled 2014-01-22: qty 1

## 2014-01-22 NOTE — ED Provider Notes (Signed)
CSN: [id number]     Arrival date & time 01/22/14  0917 History   First MD Initiated Contact with Patient 01/22/14 [phone]     Chief Complaint  Patient presents with  . Shoulder Pain     (Consider location/radiation/quality/duration/timing/severity/associated sxs/prior Treatment) HPI Comments: Patient is a 73 year old female who presents to the emergency department with complaint of left shoulder pain. The patient states that on yesterday morning she noted severe pain in her left shoulder. The pain today he began to extend into the left upper arm. The patient denies any recent injury or trauma. The patient has not had any operations or procedures involving the left upper extremity. It is of note that the patient had to have" an injection" of the right shoulder earlier during the week because of bursitis. Patient states she tried Tylenol yesterday and today without any improvement in the pain, she presents to the emergency department now for evaluation.  Patient is a 73 y.o. female presenting with shoulder pain. The history is provided by the patient.  Shoulder Pain This is a recurrent problem. The current episode started yesterday. Associated symptoms include arthralgias and fatigue. Pertinent negatives include no abdominal pain, chest pain, coughing or neck pain.    Past Medical History  Diagnosis Date  . Dyslipidemia   . Hypertension   . Mild obesity   . Postmenopausal   . DJD (degenerative joint disease)     of neck    . Diabetes mellitus   . Chronic fatigue    Past Surgical History  Procedure Laterality Date  . Left breast biopsy    . Appendectomy    . Abdominal hysterectomy    . Colonoscopy  12/22/2011    internal hemorrhoids  . Esophagogastroduodenoscopy  12/22/2011    diverticulosis mild left-side/gastritis  . Lesion removal Right 03/30/2013    Procedure: EXCISION OF NEOPLASM ARM;  Surgeon: Jamesetta So, MD;  Location: [facility];  Service: General;  Laterality: Right;  . Eye surgery  Right 06/07/2013   Family History  Problem Relation Age of Onset  . Heart failure Father   . Hypertension Father   . Hypertension Sister   . Hypertension Brother    History  Substance Use Topics  . Smoking status: Never Smoker   . Smokeless tobacco: Not on file  . Alcohol Use: No   OB History   Grav Para Term Preterm Abortions TAB SAB Ect Mult Living                 Review of Systems  Constitutional: Positive for fatigue. Negative for activity change.       All ROS Neg except as noted in HPI  HENT: Negative for nosebleeds.   Eyes: Negative for photophobia and discharge.  Respiratory: Negative for cough, shortness of breath and wheezing.   Cardiovascular: Negative for chest pain and palpitations.  Gastrointestinal: Negative for abdominal pain and blood in stool.  Genitourinary: Negative for dysuria, frequency and hematuria.  Musculoskeletal: Positive for arthralgias. Negative for back pain and neck pain.  Skin: Negative.   Neurological: Negative for dizziness, seizures and speech difficulty.  Psychiatric/Behavioral: Negative for hallucinations and confusion.      Allergies  Codeine and Propoxyphene n-acetaminophen  Home Medications   Prior to Admission medications   Medication Sig Start Date End Date Taking? Authorizing Provider  acetaminophen (TYLENOL) 500 MG tablet Take 1,000 mg by mouth every 6 (six) hours as needed. For pain   Yes Historical Provider, MD  aspirin EC  81 MG tablet Take 81 mg by mouth every evening.    Yes Historical Provider, MD  benazepril (LOTENSIN) 10 MG tablet Take 1 tablet (10 mg total) by mouth daily. 10/24/13  Yes Fayrene Helper, MD  calcium-vitamin D (OSCAL 500/200 D-3) 500-200 MG-UNIT per tablet Take 1 tablet by mouth at bedtime.    Yes Historical Provider, MD  cloNIDine (CATAPRES) 0.1 MG tablet TAKE 1 TABLET BY MOUTH AT BEDTIME 01/13/14  Yes Fayrene Helper, MD  ibuprofen (ADVIL,MOTRIN) 600 MG tablet Take 600 mg by mouth daily as needed  for mild pain.   Yes Historical Provider, MD  metFORMIN (GLUCOPHAGE-XR) 500 MG 24 hr tablet Take 1,000 mg by mouth daily with breakfast.   Yes Historical Provider, MD  pravastatin (PRAVACHOL) 20 MG tablet TAKE 1 TABLET BY MOUTH EVERY EVENING 07/20/13  Yes Fayrene Helper, MD  venlafaxine XR (EFFEXOR-XR) 37.5 MG 24 hr capsule TAKE 1 CAPSULE BY MOUTH DAILY WITH BREAKFAST. 01/13/14  Yes Fayrene Helper, MD  predniSONE (STERAPRED UNI-PAK) 5 MG TABS tablet Take 1 tablet (5 mg total) by mouth as directed. Use as directed 01/13/14   Fayrene Helper, MD   BP 118/64  Pulse 87  Temp(Src) 97.6 F (36.4 C) (Oral)  Resp 18  Ht 5\' 5"  (1.651 m)  Wt 191 lb (86.637 kg)  BMI 31.78 kg/m2  SpO2 100% Physical Exam  Nursing note and vitals reviewed. Constitutional: She is oriented to person, place, and time. She appears well-developed and well-nourished.  Non-toxic appearance.  HENT:  Head: Normocephalic.  Right Ear: Tympanic membrane and external ear normal.  Left Ear: Tympanic membrane and external ear normal.  Eyes: EOM and lids are normal. Pupils are equal, round, and reactive to light.  Neck: Normal range of motion. Neck supple. Carotid bruit is not present.  Cardiovascular: Normal rate, regular rhythm, normal heart sounds, intact distal pulses and normal pulses.   Pulmonary/Chest: Breath sounds normal. No respiratory distress.  Abdominal: Soft. Bowel sounds are normal. There is no tenderness. There is no guarding.  Musculoskeletal:       Left shoulder: She exhibits decreased range of motion, tenderness, crepitus and pain.       Arms: Lymphadenopathy:       Head (right side): No submandibular adenopathy present.       Head (left side): No submandibular adenopathy present.    She has no cervical adenopathy.  Neurological: She is alert and oriented to person, place, and time. She has normal strength. No cranial nerve deficit or sensory deficit.  Skin: Skin is warm and dry.  Psychiatric: She  has a normal mood and affect. Her speech is normal.    ED Course  Procedures (including critical care time) Labs Review Labs Reviewed - No data to display  Imaging Review Dg Shoulder Left  01/22/2014   CLINICAL DATA:  Pain.  EXAM: LEFT SHOULDER - 2+ VIEW  COMPARISON:  None.  FINDINGS: Calcifications noted in the region of the distal supraspinatus tendon consistent with calcific supraspinatus tendinitis. No acute bony or joint abnormality identified. No evidence of fracture or dislocation. No separation.  IMPRESSION: Findings consistent with calcific supraspinatus tendinitis. No acute abnormality.   Electronically Signed   By: Marcello Moores  Register   On: 01/22/2014 10:39     EKG Interpretation None      MDM X-ray of the left shoulder reveals calcific supraspinatus tendinitis. This is consistent with the area that the patient is most painful in the left shoulder. The  electrocardiogram shows a normal sinus rhythm. There is no acute event appreciated.  The patient is treated in the emergency department with intramuscular Decadron and oral Norco. Prescription for Decadron and Norco also given to the patient. Patient is instructed to followup with her primary physician. I did not offer the patient a sling do to the fact she has recently had procedures done on the right shoulder, and also concerned for her balance.    Final diagnoses:  None    *I have reviewed nursing notes, vital signs, and all appropriate lab and imaging results for this patient.Lenox Ahr, PA-C 01/22/14 1102

## 2014-01-22 NOTE — ED Notes (Signed)
Pt reports left shoulder and upper arm pain since yesterday morning.  Denies injury.    Reports nauseated at times.  Pt says pain is the worst when she raises her left arm.  Denies any chest, neck, or jaw pain.  Denies any SOB.

## 2014-01-22 NOTE — ED Notes (Signed)
Pt received discharge instructions and prescriptions, verbalized understanding and has no further questions. Pt ambulated to exit in stable condition accompanied by family.  Advised to follow up with orthopedist.

## 2014-01-22 NOTE — Discharge Instructions (Signed)
Heat to your left shoulder may be helpful. Please use Decadron one daily with food. May use Norco every 4 hours if needed for pain. This medication may cause drowsiness, please use with caution. Please see Dr. Moshe Cipro for additional evaluation.  Calcific Tendinitis Calcific tendinitis occurs when crystals of calcium are deposited in a tendon. Tendons are bands of strong, fibrous tissue that attach muscles to bones. Tendons are an important part of joints. They make the joint move and they absorb some of the stress that a joint receives during use. When calcium is deposited in the tendon, the tendon becomes stiff, painful, and it can become swollen. Calcific tendinitis occurs frequently in the shoulder joint, in a structure called the rotator cuff. CAUSES  The cause of calcific tendinitis is unclear. It may be associated with:  Overuse of the tendon, such as from repetitive motion.  Excess stress on the tendon.  Aging.  Repetitive, mild injuries. SYMPTOMS   Pain may or may not be present. If it is present, it may occur when moving the joint.  Tenderness when pressure is applied to the tendon.  A snapping or popping sound when the joint moves.  Decreased motion of the joint.  Difficulty sleeping due to pain in the joint. DIAGNOSIS  Your caregiver will perform a physical exam. Imaging tests may also be used to make the diagnosis. These may include X-rays, an MRI, or a CT scan. TREATMENT  Generally, calcific tendinitis resolves on its own. Treatment for pain of calcific tendinitis may include:  Taking over-the-counter medicines, such as anti-inflammatory drugs.  Applying ice packs to the joint.  Following a specific exercise program to keep the joint working properly.  Attending physical therapy sessions.  Avoiding activities that cause pain. Treatment for more severe calcific tendinitis may require:  Injecting cortisone steroids or pain relieving medicines into or around the  joint.  Manipulating the joint after you are given medicine to numb the area (local anesthetic).  Inflating the joint with sterile fluid to increase the flexibility of the tendons.  Shockwave therapy, which involves focusing sounds waves on the joint. If other treatments do not work, surgery may be done to clean out the calcium deposits and repair the tendons where needed. Most people do not need surgery. HOME CARE INSTRUCTIONS   Only take over-the-counter or prescription medicines for pain, fever, or discomfort as directed by your caregiver.  Follow your caregiver's recommendations for activity and exercise. SEEK MEDICAL CARE IF:  You notice an increase in pain or numbness.  You develop new weakness.  You notice increased joint stiffness or a sensation of looseness in the joint.  You notice increasing redness, swelling, or warmth around the joint area. SEEK IMMEDIATE MEDICAL CARE IF:  You have a fever or persistent symptoms for more than 2 to 3 days.  You have a fever and your symptoms suddenly get worse. MAKE SURE YOU:  Understand these instructions.  Will watch your condition.  Will get help right away if you are not doing well or get worse. Document Released: 06/17/2008 Document Revised: 03/09/2012 Document Reviewed: 12/18/2011 [facility] Patient Information 2014 [place].

## 2014-01-22 NOTE — ED Notes (Signed)
Pt has gone to radiology for her x-ray and is now back

## 2014-01-24 NOTE — ED Provider Notes (Signed)
Medical screening examination/treatment/procedure(s) were performed by non-physician practitioner and as supervising physician I was immediately available for consultation/collaboration.   EKG Interpretation   Date/Time:  Sunday Jan 22 2014 09:49:42 EDT Ventricular Rate:  75 PR Interval:  152 QRS Duration: 84 QT Interval:  370 QTC Calculation: 413 R Axis:   30 Text Interpretation:  Normal sinus rhythm with sinus arrhythmia Normal ECG  ED PHYSICIAN INTERPRETATION AVAILABLE IN [facility] Confirmed by  TEST, Record ([id number]) on 01/24/2014 7:24:17 AM       Jasper Riling. Alvino Chapel, MD 01/24/14 [phone]

## 2014-03-17 ENCOUNTER — Other Ambulatory Visit: Payer: Self-pay | Admitting: Family Medicine

## 2014-05-11 LAB — HM DIABETES EYE EXAM

## 2014-05-26 ENCOUNTER — Other Ambulatory Visit: Payer: Self-pay | Admitting: Family Medicine

## 2014-05-27 LAB — BASIC METABOLIC PANEL WITH GFR
BUN: 13 mg/dL (ref 6–23)
CALCIUM: 9.4 mg/dL (ref 8.4–10.5)
CO2: 27 mEq/L (ref 19–32)
Chloride: 106 mEq/L (ref 96–112)
Creat: 0.84 mg/dL (ref 0.50–1.10)
GFR, EST NON AFRICAN AMERICAN: 70 mL/min
GFR, Est African American: 81 mL/min
GLUCOSE: 100 mg/dL — AB (ref 70–99)
POTASSIUM: 4.4 meq/L (ref 3.5–5.3)
SODIUM: 145 meq/L (ref 135–145)

## 2014-05-27 LAB — HEMOGLOBIN A1C
HEMOGLOBIN A1C: 6.5 % — AB (ref <5.7)
Mean Plasma Glucose: 140 mg/dL — ABNORMAL HIGH (ref <117)

## 2014-06-01 ENCOUNTER — Ambulatory Visit (INDEPENDENT_AMBULATORY_CARE_PROVIDER_SITE_OTHER): Payer: Medicare HMO | Admitting: Family Medicine

## 2014-06-01 ENCOUNTER — Encounter: Payer: Self-pay | Admitting: Family Medicine

## 2014-06-01 VITALS — BP 134/80 | HR 74 | Resp 18 | Wt 191.0 lb

## 2014-06-01 DIAGNOSIS — I10 Essential (primary) hypertension: Secondary | ICD-10-CM

## 2014-06-01 DIAGNOSIS — Z13820 Encounter for screening for osteoporosis: Secondary | ICD-10-CM

## 2014-06-01 DIAGNOSIS — Z0000 Encounter for general adult medical examination without abnormal findings: Secondary | ICD-10-CM

## 2014-06-01 DIAGNOSIS — E119 Type 2 diabetes mellitus without complications: Secondary | ICD-10-CM

## 2014-06-01 DIAGNOSIS — Z23 Encounter for immunization: Secondary | ICD-10-CM

## 2014-06-01 LAB — LIPID PANEL
CHOLESTEROL: 132 mg/dL (ref 0–200)
HDL: 49 mg/dL (ref >39)
LDL Cholesterol: 63 mg/dL (ref 0–99)
TRIGLYCERIDES: 100 mg/dL (ref <150)
Total CHOL/HDL Ratio: 2.7 Ratio
VLDL: 20 mg/dL (ref 0–40)

## 2014-06-01 LAB — HEPATIC FUNCTION PANEL
ALBUMIN: 4.3 g/dL (ref 3.5–5.2)
ALK PHOS: 72 U/L (ref 39–117)
ALT: 11 U/L (ref 0–35)
AST: 16 U/L (ref 0–37)
Bilirubin, Direct: 0.1 mg/dL (ref 0.0–0.3)
Indirect Bilirubin: 0.3 mg/dL (ref 0.2–1.2)
TOTAL PROTEIN: 7 g/dL (ref 6.0–8.3)
Total Bilirubin: 0.4 mg/dL (ref 0.2–1.2)

## 2014-06-01 NOTE — Patient Instructions (Addendum)
Fall Prevention and Home Safety Falls cause injuries and can affect all age groups. It is possible to use preventive measures to significantly decrease the likelihood of falls. There are many simple measures which can make your home safer and prevent falls. OUTDOORS  Repair cracks and edges of walkways and driveways.  Remove high doorway thresholds.  Trim shrubbery on the main path into your home.  Have good outside lighting.  Clear walkways of tools, rocks, debris, and clutter.  Check that handrails are not broken and are securely fastened. Both sides of steps should have handrails.  Have leaves, snow, and ice cleared regularly.  Use sand or salt on walkways during winter months.  In the garage, clean up grease or oil spills. BATHROOM  Install night lights.  Install grab bars by the toilet and in the tub and shower.  Use non-skid mats or decals in the tub or shower.  Place a plastic non-slip stool in the shower to sit on, if needed.  Keep floors dry and clean up all water on the floor immediately.  Remove soap buildup in the tub or shower on a regular basis.  Secure bath mats with non-slip, double-sided rug tape.  Remove throw rugs and tripping hazards from the floors. BEDROOMS  Install night lights.  Make sure a bedside light is easy to reach.  Do not use oversized bedding.  Keep a telephone by your bedside.  Have a firm chair with side arms to use for getting dressed.  Remove throw rugs and tripping hazards from the floor. KITCHEN  Keep handles on pots and pans turned toward the center of the stove. Use back burners when possible.  Clean up spills quickly and allow time for drying.  Avoid walking on wet floors.  Avoid hot utensils and knives.  Position shelves so they are not too high or low.  Place commonly used objects within easy reach.  If necessary, use a sturdy step stool with a grab bar when reaching.  Keep electrical cables out of the  way.  Do not use floor polish or wax that makes floors slippery. If you must use wax, use non-skid floor wax.  Remove throw rugs and tripping hazards from the floor. STAIRWAYS  Never leave objects on stairs.  Place handrails on both sides of stairways and use them. Fix any loose handrails. Make sure handrails on both sides of the stairways are as long as the stairs.  Check carpeting to make sure it is firmly attached along stairs. Make repairs to worn or loose carpet promptly.  Avoid placing throw rugs at the top or bottom of stairways, or properly secure the rug with carpet tape to prevent slippage. Get rid of throw rugs, if possible.  Have an electrician put in a light switch at the top and bottom of the stairs. OTHER FALL PREVENTION TIPS  Wear low-heel or rubber-soled shoes that are supportive and fit well. Wear closed toe shoes.  When using a stepladder, make sure it is fully opened and both spreaders are firmly locked. Do not climb a closed stepladder.  Add color or contrast paint or tape to grab bars and handrails in your home. Place contrasting color strips on first and last steps.  Learn and use mobility aids as needed. Install an electrical emergency response system.  Turn on lights to avoid dark areas. Replace light bulbs that burn out immediately. Get light switches that glow.  Arrange furniture to create clear pathways. Keep furniture in the same place.    Firmly attach carpet with non-skid or double-sided tape.  Eliminate uneven floor surfaces.  Select a carpet pattern that does not visually hide the edge of steps.  Be aware of all pets. OTHER HOME SAFETY TIPS  Set the water temperature for 120 F (48.8 C).  Keep emergency numbers on or near the telephone.  Keep smoke detectors on every level of the home and near sleeping areas. Document Released: 08/29/2002 Document Revised: 03/09/2012 Document Reviewed: 11/28/2011 [facility] Patient Information 2015  [place], [place]. This information is not intended to replace advice given to you by your health care provider. Make sure you discuss any questions you have with your health care provider.    F/u in Early January, call if you need me before  Flu vaccine today  Blood sugar is good and so is your foot exam   Chem 7  and eGFr and hBa1C, CBC and tSH in January  You are referred for a bone density test this is due

## 2014-06-01 NOTE — Progress Notes (Signed)
Subjective:    Patient ID: Jillian [name], female    DOB: 15-Jul-1941, 73 y.o.   MRN: [id number]  HPI  Preventive Screening-Counseling & Management   Patient present here today for an initial  Medicare annual wellness visit.   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 No  Over the past two weeks, have you felt little interest or pleasure in doing things? No  Have you lost interest or pleasure in daily life? No  Do you often feel hopeless? No  Do you cry easily over simple problems? No   Activities of Daily Living  In your present state of health, do you have any difficulty performing the following activities?  Driving?: No Managing money?: No Feeding yourself?:No Getting from bed to chair?:No Climbing a flight of stairs?:No Preparing food and eating?:No Bathing or showering?:No Getting dressed?:No Getting to the toilet?:No Using the toilet?:No Moving around from place to place?: No  Fall Risk Assessment In the past year have you fallen or had a near fall?:Yes x 2 within the last month Are you currently taking any medications that make you dizzy?:No   Hearing Difficulties: No Do you often ask people to speak up or repeat themselves?:No Do you experience ringing or noises in your ears?:No Do you have difficulty understanding soft or whispered voices?:No  Cognitive Testing  Alert? Yes Normal Appearance?Yes  Oriented to person?  Yes Place? Yes  Time? Yes  Displays appropriate judgment?Yes  Can read the correct time from a watch face? yes Are you having problems remembering things?No  Advanced Directives have been discussed with the patient?Yes and brochure given , full code   List the Names of Other Physician/Practitioners you currently use: updated and verified    Indicate any recent Medical Services you may have received from other than [facility] providers in the past year (date may be approximate).   Assessment:    Annual Wellness Exam   Plan:    Medicare Attestation  I have personally reviewed:  The patient's medical and social history  Their use of alcohol, tobacco or illicit drugs  Their current medications and supplements  The patient's functional ability including ADLs,fall risks, home safety risks, cognitive, and hearing and visual impairment  Diet and physical activities  Evidence for depression or mood disorders  The patient's weight, height, BMI, and visual acuity have been recorded in the chart. I have made referrals, counseling, and provided education to the patient based on review of the above and I have provided the patient with a written personalized care plan for preventive services.     Review of Systems     Objective:   Physical Exam        Assessment & Plan:  Medicare annual wellness visit, initial Annual exam as documented. Counseling done  re healthy lifestyle involving commitment to 150 minutes exercise per week, heart healthy diet, and attaining healthy weight.The importance of adequate sleep also discussed. Regular seat  belt use and safe storage  of firearms , is also discussed. Changes in health habits are decided on by the patient with goals and time frames  set for achieving them. Immunization and cancer screening needs are specifically addressed at this visit.   Controlled diabetes mellitus with nephropathy Controlled, no change in medication Patient advised to  reduce carb and sweets, commit to regular physical activity, take meds as prescribed, test blood as directed, and attempt to lose weight, to improve blood sugar control.   Need for prophylactic vaccination and inoculation against influenza Vaccine administered at visit.

## 2014-06-04 DIAGNOSIS — Z23 Encounter for immunization: Secondary | ICD-10-CM | POA: Insufficient documentation

## 2014-06-04 DIAGNOSIS — Z0000 Encounter for general adult medical examination without abnormal findings: Secondary | ICD-10-CM | POA: Insufficient documentation

## 2014-06-04 NOTE — Assessment & Plan Note (Signed)
Controlled, no change in medication Patient advised to reduce carb and sweets, commit to regular physical activity, take meds as prescribed, test blood as directed, and attempt to lose weight, to improve blood sugar control.  

## 2014-06-04 NOTE — Assessment & Plan Note (Signed)
Annual exam as documented. Counseling done  re healthy lifestyle involving commitment to 150 minutes exercise per week, heart healthy diet, and attaining healthy weight.The importance of adequate sleep also discussed. Regular seat belt use and safe storage  of firearms , is also discussed. Changes in health habits are decided on by the patient with goals and time frames  set for achieving them. Immunization and cancer screening needs are specifically addressed at this visit.

## 2014-06-04 NOTE — Assessment & Plan Note (Signed)
Vaccine administered at visit.  

## 2014-06-07 ENCOUNTER — Ambulatory Visit (HOSPITAL_COMMUNITY)
Admission: RE | Admit: 2014-06-07 | Discharge: 2014-06-07 | Disposition: A | Discharge status: Home / Self Care | Payer: Medicare HMO | Source: Ambulatory Visit | Attending: Family Medicine | Admitting: Family Medicine

## 2014-06-07 DIAGNOSIS — Z13820 Encounter for screening for osteoporosis: Secondary | ICD-10-CM | POA: Insufficient documentation

## 2014-06-19 ENCOUNTER — Other Ambulatory Visit: Payer: Self-pay | Admitting: Family Medicine

## 2014-06-19 DIAGNOSIS — Z09 Encounter for follow-up examination after completed treatment for conditions other than malignant neoplasm: Secondary | ICD-10-CM

## 2014-06-19 DIAGNOSIS — N6310 Unspecified lump in the right breast, unspecified quadrant: Secondary | ICD-10-CM

## 2014-06-19 DIAGNOSIS — R921 Mammographic calcification found on diagnostic imaging of breast: Secondary | ICD-10-CM

## 2014-07-04 ENCOUNTER — Other Ambulatory Visit: Payer: Self-pay | Admitting: Family Medicine

## 2014-07-04 ENCOUNTER — Ambulatory Visit (HOSPITAL_COMMUNITY)
Admission: RE | Admit: 2014-07-04 | Discharge: 2014-07-04 | Disposition: A | Discharge status: Home / Self Care | Payer: Medicare HMO | Source: Ambulatory Visit | Attending: Family Medicine | Admitting: Family Medicine

## 2014-07-04 DIAGNOSIS — N6310 Unspecified lump in the right breast, unspecified quadrant: Secondary | ICD-10-CM

## 2014-07-04 DIAGNOSIS — R921 Mammographic calcification found on diagnostic imaging of breast: Secondary | ICD-10-CM

## 2014-07-04 DIAGNOSIS — Z09 Encounter for follow-up examination after completed treatment for conditions other than malignant neoplasm: Secondary | ICD-10-CM

## 2014-07-04 DIAGNOSIS — R928 Other abnormal and inconclusive findings on diagnostic imaging of breast: Secondary | ICD-10-CM | POA: Insufficient documentation

## 2014-07-11 ENCOUNTER — Other Ambulatory Visit: Payer: Self-pay | Admitting: Family Medicine

## 2014-07-11 ENCOUNTER — Encounter (HOSPITAL_COMMUNITY): Payer: Self-pay

## 2014-07-11 ENCOUNTER — Ambulatory Visit (HOSPITAL_COMMUNITY)
Admission: RE | Admit: 2014-07-11 | Discharge: 2014-07-11 | Disposition: A | Discharge status: Home / Self Care | Payer: Medicare HMO | Source: Ambulatory Visit | Attending: Family Medicine | Admitting: Family Medicine

## 2014-07-11 DIAGNOSIS — N6310 Unspecified lump in the right breast, unspecified quadrant: Secondary | ICD-10-CM

## 2014-07-11 DIAGNOSIS — N63 Unspecified lump in breast: Secondary | ICD-10-CM | POA: Diagnosis present

## 2014-07-11 DIAGNOSIS — D241 Benign neoplasm of right breast: Secondary | ICD-10-CM | POA: Diagnosis not present

## 2014-07-11 DIAGNOSIS — R928 Other abnormal and inconclusive findings on diagnostic imaging of breast: Secondary | ICD-10-CM

## 2014-07-11 MED ORDER — LIDOCAINE HCL (PF) 2 % IJ SOLN
INTRAMUSCULAR | Status: DC
Start: 2014-07-11 — End: 2014-07-12
  Filled 2014-07-11: qty 10

## 2014-07-11 NOTE — Discharge Instructions (Signed)
Breast Biopsy Care After These instructions give you information on caring for yourself after your procedure. Your doctor may also give you more specific instructions. Call your doctor if you have any problems or questions after your procedure. HOME CARE  Only take medicine as told by your doctor.  Do not take aspirin.  Keep your sutures (stitches) dry when bathing.  Protect the biopsy area. Do not let the area get bumped.  Avoid activities that could pull the biopsy site open until your doctor approves. This includes:  Stretching.  Reaching.  Exercise.  Sports.  Lifting more than 3lb.  Continue your normal diet.  Wear a good support bra for as long as told by your doctor.  Change any bandages (dressings) as told by your doctor.  Do not drink alcohol while taking pain medicine.  Keep all doctor visits as told. Ask when your test results will be ready. Make sure you get your test results. GET HELP RIGHT AWAY IF:   You have a fever.  You have more bleeding (more than a small spot) from the biopsy site.  You have trouble breathing.  You have yellowish-white fluid (pus) coming from the biopsy site.  You have redness, puffiness (swelling), or more pain in the biopsy site.  You have a bad smell coming from the biopsy site.  Your biopsy site opens after sutures, staples, or sticky strips have been removed.  You have a rash.  You need stronger medicine. MAKE SURE YOU:  Understand these instructions.  Will watch your condition.  Will get help right away if you are not doing well or get worse. Document Released: 07/05/2009 Document Revised: 12/01/2011 Document Reviewed: 10/19/2011 [facility] Patient Information 2015 [employer], [place]. This information is not intended to replace advice given to you by your health care provider. Make sure you discuss any questions you have with your health care provider.  Breast Biopsy A breast biopsy is a test during which a sample of  tissue is taken from your breast. The breast tissue is looked at under a microscope for cancer cells.  BEFORE THE PROCEDURE  Make plans to have someone drive you home after the test.  Do not smoke for 2 weeks before the test. Stop smoking, if you smoke.  Do not drink alcohol for 24 hours before the test.  Wear a good support bra to the test. PROCEDURE  You may be given one of the following:  A medicine to numb the breast area (local anesthetic).  A medicine to make you fall asleep (general anesthetic). There are different types of breast biopsies. They include:  Fine-needle aspiration.  A needle is put into the breast lump.  The needle takes out fluid and cells from the lump.  Ultrasound imaging may be used to help find the lump and to put the needle in the right spot.  Core-needle biopsy.  A needle is put into the breast lump.  The needle is put in your breast 3-6 times.  The needle removes breast tissue.  An ultrasound image or X-ray is often used to find the right spot to put in the needle.  Stereotactic biopsy.  X-rays and a computer are used to study X-ray pictures of the breast lump.  The computer finds where the needle needs to be put into the breast.  Tissue samples are taken out.  Vacuum-assisted biopsy.  A small cut (incision) is made in your breast.  A biopsy device is put through the cut and into the breast  tissue.  The biopsy device draws abnormal breast tissue into the biopsy device.  A large tissue sample is often removed.  No stitches are needed.  Ultrasound-guided core-needle biopsy.  Ultrasound imaging helps guide the needle into the area of the breast that is not normal.  A cut is made in the breast. The needle is put into the breast lump.  Tissue samples are taken out.  Open biopsy.  A large cut is made in the breast.  Your doctor will try to remove the whole breast lump or as much as possible. All tissue, fluid, or cell samples  are looked at under a microscope.  AFTER THE PROCEDURE  You will be taken to an area to recover. You will be able to go home once you are doing well and are without problems.  You may have bruising on your breast. This is normal.  A pressure bandage (dressing) may be put on your breast for 24-48 hours. This type of bandage is wrapped tightly around your chest. It helps stop fluid from building up underneath tissues. Document Released: 12/01/2011 Document Revised: 01/23/2014 Document Reviewed: 12/01/2011 [facility] Patient Information 2015 [place], [place]. This information is not intended to replace advice given to you by your health care provider. Make sure you discuss any questions you have with your health care provider.  Breast Biopsy A breast biopsy is a test during which a sample of tissue is taken from your breast. The breast tissue is looked at under a microscope for cancer cells.  BEFORE THE PROCEDURE  Make plans to have someone drive you home after the test.  Do not smoke for 2 weeks before the test. Stop smoking, if you smoke.  Do not drink alcohol for 24 hours before the test.  Wear a good support bra to the test. PROCEDURE  You may be given one of the following:  A medicine to numb the breast area (local anesthetic).  A medicine to make you fall asleep (general anesthetic). There are different types of breast biopsies. They include:  Fine-needle aspiration.  A needle is put into the breast lump.  The needle takes out fluid and cells from the lump.  Ultrasound imaging may be used to help find the lump and to put the needle in the right spot.  Core-needle biopsy.  A needle is put into the breast lump.  The needle is put in your breast 3-6 times.  The needle removes breast tissue.  An ultrasound image or X-ray is often used to find the right spot to put in the needle.  Stereotactic biopsy.  X-rays and a computer are used to study X-ray pictures of the breast  lump.  The computer finds where the needle needs to be put into the breast.  Tissue samples are taken out.  Vacuum-assisted biopsy.  A small cut (incision) is made in your breast.  A biopsy device is put through the cut and into the breast tissue.  The biopsy device draws abnormal breast tissue into the biopsy device.  A large tissue sample is often removed.  No stitches are needed.  Ultrasound-guided core-needle biopsy.  Ultrasound imaging helps guide the needle into the area of the breast that is not normal.  A cut is made in the breast. The needle is put into the breast lump.  Tissue samples are taken out.  Open biopsy.  A large cut is made in the breast.  Your doctor will try to remove the whole breast lump or as much  as possible. All tissue, fluid, or cell samples are looked at under a microscope.  AFTER THE PROCEDURE  You will be taken to an area to recover. You will be able to go home once you are doing well and are without problems.  You may have bruising on your breast. This is normal.  A pressure bandage (dressing) may be put on your breast for 24-48 hours. This type of bandage is wrapped tightly around your chest. It helps stop fluid from building up underneath tissues. Document Released: 12/01/2011 Document Revised: 01/23/2014 Document Reviewed: 12/01/2011 [facility] Patient Information 2015 [place], [place]. This information is not intended to replace advice given to you by your health care provider. Make sure you discuss any questions you have with your health care provider.

## 2014-07-12 ENCOUNTER — Other Ambulatory Visit: Payer: Self-pay | Admitting: Family Medicine

## 2014-07-12 ENCOUNTER — Telehealth: Payer: Self-pay | Admitting: Family Medicine

## 2014-07-12 DIAGNOSIS — N6459 Other signs and symptoms in breast: Secondary | ICD-10-CM

## 2014-07-12 DIAGNOSIS — D241 Benign neoplasm of right breast: Secondary | ICD-10-CM

## 2014-07-12 NOTE — Telephone Encounter (Signed)
Pls see result note on pt's mammogram, and work together so that referral that I have entered can be completed if she is willing to have this done now (which I anticipate will be the case, thanks I have entered the referral as " urgent also'

## 2014-07-13 NOTE — Telephone Encounter (Signed)
Patient aware and appointment scheduled.  

## 2014-07-14 ENCOUNTER — Telehealth: Payer: Self-pay | Admitting: Family Medicine

## 2014-07-14 NOTE — Telephone Encounter (Signed)
Noted  

## 2014-07-17 ENCOUNTER — Other Ambulatory Visit: Payer: Self-pay | Admitting: Family Medicine

## 2014-08-04 ENCOUNTER — Ambulatory Visit (INDEPENDENT_AMBULATORY_CARE_PROVIDER_SITE_OTHER): Payer: Self-pay | Admitting: Surgery

## 2014-08-04 DIAGNOSIS — D241 Benign neoplasm of right breast: Secondary | ICD-10-CM

## 2014-08-04 NOTE — H&P (Signed)
Jillian [name] 08/04/2014 10:34 AM Location: [facility] Patient #: [id number] DOB: [birth year] Married / Language: English / Race: Black or African American Female History of Present Illness Marcello Moores A. Talan Gildner MD; 08/04/2014 10:58 AM) Patient words: eval right breast   pt sent at the request of dr simpson for abnormal mamogram. Pt denies breast pain, nipple dischare, mass or other problem. no hx of cancer. first biopsy.  CLINICAL DATA: Suspicious right breast mass status post ultrasound-guided core needle biopsy  EXAM: DIAGNOSTIC RIGHT MAMMOGRAM POST ULTRASOUND BIOPSY  COMPARISON: Previous exams  FINDINGS: Mammographic images were obtained following ultrasound guided biopsy of suspicious right breast mass. Ribbon shaped marking clip is in appropriate position along the anterior margin of the tubular shaped right breast mass.  IMPRESSION: Ribbon shaped marking clip along the anterior margin of the tubular shaped right breast mass.  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 - ASSOCIATED CALCIFICATIONS. Microscopic Comment The findings are called to the [facility] on 07/12/14. Dr. Lyndon Code has seen this.  The patient is a 73 year old female   Other Problems Marjean Donna, [place]; 08/04/2014 10:35 AM) Arthritis Diabetes Mellitus High blood pressure Hypercholesterolemia  Past Surgical History Marjean Donna, [place]; 08/04/2014 10:35 AM) Appendectomy Breast Biopsy Right. Cataract Surgery Bilateral. Hysterectomy (not due to cancer) - Complete  Diagnostic Studies History Marjean Donna, CMA; 08/04/2014 10:35 AM) Colonoscopy 5-10 years ago Mammogram within last year  Allergies Marjean Donna, [place]; 08/04/2014 10:35 AM) No Known Drug Allergies 08/04/2014  Medication History (Sonya Bynum, CMA;  08/04/2014 10:36 AM) Benazepril HCl (10MG  Tablet, Oral) Active. Pravastatin Sodium (20MG  Tablet, Oral) Active. Venlafaxine HCl ER (37.5MG  Capsule ER 24HR, Oral) Active. MetFORMIN HCl ER (500MG  Tablet ER 24HR, Oral) Active. Ibuprofen (600MG  Tablet, Oral) Active. CloNIDine HCl (0.1MG  Tablet, Oral) Active.  Social History ([place]; 08/04/2014 10:35 AM) Caffeine use Coffee, Tea. No alcohol use No drug use Tobacco use Never smoker.  Family History Marjean Donna, [place]; 08/04/2014 10:35 AM) Arthritis Sister. Diabetes Mellitus Brother. Hypertension Brother.  Pregnancy / Birth History Marjean Donna, [place]; 08/04/2014 10:35 AM) Age at menarche 63 years. Gravida 3 Maternal age 2-20 Para 2     Review of Systems ([place]; 08/04/2014 10:35 AM) General Present- Night Sweats. Not Present- Appetite Loss, Chills, Fatigue, Fever, Weight Gain and Weight Loss. Skin Not Present- Change in Wart/Mole, Dryness, Hives, Jaundice, New Lesions, Non-Healing Wounds, Rash and Ulcer. HEENT Present- Wears glasses/contact lenses. Not Present- Earache, Hearing Loss, Hoarseness, Nose Bleed, Oral Ulcers, Ringing in the Ears, Seasonal Allergies, Sinus Pain, Sore Throat, Visual Disturbances and Yellow Eyes. Respiratory Not Present- Bloody sputum, Chronic Cough, Difficulty Breathing, Snoring and Wheezing. Breast Not Present- Breast Mass, Breast Pain, Nipple Discharge and Skin Changes. Cardiovascular Not Present- Chest Pain, Difficulty Breathing Lying Down, Leg Cramps, Palpitations, Rapid Heart Rate, Shortness of Breath and Swelling of Extremities. Gastrointestinal Not Present- Abdominal Pain, Bloating, Bloody Stool, Change in Bowel Habits, Chronic diarrhea, Constipation, Difficulty Swallowing, Excessive gas, Gets full quickly at meals, Hemorrhoids, Indigestion, Nausea, Rectal Pain and Vomiting. Female Genitourinary Not Present- Frequency, Nocturia, Painful Urination, Pelvic Pain and  Urgency. Musculoskeletal Present- Joint Pain. Not Present- Back Pain, Joint Stiffness, Muscle Pain, Muscle Weakness and Swelling of Extremities. Psychiatric Not Present- Anxiety, Bipolar, Change in Sleep Pattern, Depression, Fearful and Frequent crying. Endocrine Present- Hot flashes. Not Present- Cold Intolerance, Excessive Hunger, Hair Changes, Heat  Intolerance and New Diabetes. Hematology Not Present- Easy Bruising, Excessive bleeding, Gland problems, HIV and Persistent Infections.  Vitals (Sonya Bynum CMA; 08/04/2014 10:37 AM) 08/04/2014 10:36 AM Weight: 190 lb Height: 65in Body Surface Area: 1.99 m Body Mass Index: 31.62 kg/m Temp.: 46F(Temporal)  Pulse: 78 (Regular)  BP: 126/74 (Sitting, Left Arm, Standard)     Physical Exam (Akeira Lahm A. Duke Weisensel MD; 08/04/2014 10:58 AM)  General Mental Status-Alert. General Appearance-Consistent with stated age. Hydration-Well hydrated. Voice-Normal.  Head and Neck Head-normocephalic, atraumatic with no lesions or palpable masses. Trachea-midline. Thyroid Gland Characteristics - normal size and consistency.  Eye Eyeball - Bilateral-Extraocular movements intact. Sclera/Conjunctiva - Bilateral-No scleral icterus.  Chest and Lung Exam Chest and lung exam reveals -quiet, even and easy respiratory effort with no use of accessory muscles and on auscultation, normal breath sounds, no adventitious sounds and normal vocal resonance. Inspection Chest Wall - Normal. Back - normal.  Breast Breast - Left-Symmetric, Non Tender, No Biopsy scars, no Dimpling, No Inflammation, No Lumpectomy scars, No Mastectomy scars, No Peau d' Orange. Breast - Right-Symmetric, Non Tender, No Biopsy scars, no Dimpling, No Inflammation, No Lumpectomy scars, No Mastectomy scars, No Peau d' Orange. Breast Lump-No Palpable Breast Mass.  Cardiovascular Cardiovascular examination reveals -normal heart sounds, regular rate and  rhythm with no murmurs and normal pedal pulses bilaterally.  Abdomen Inspection Inspection of the abdomen reveals - No Hernias. Skin - Scar - no surgical scars. Palpation/Percussion Palpation and Percussion of the abdomen reveal - Soft, Non Tender, No Rebound tenderness, No Rigidity (guarding) and No hepatosplenomegaly. Auscultation Auscultation of the abdomen reveals - Bowel sounds normal.  Neurologic Neurologic evaluation reveals -alert and oriented x 3 with no impairment of recent or remote memory. Mental Status-Normal.  Musculoskeletal Normal Exam - Left-Upper Extremity Strength Normal and Lower Extremity Strength Normal. Normal Exam - Right-Upper Extremity Strength Normal and Lower Extremity Strength Normal.  Lymphatic Head & Neck  General Head & Neck Lymphatics: Bilateral - Description - Normal. Axillary  General Axillary Region: Bilateral - Description - Normal. Tenderness - Non Tender. Femoral & Inguinal  Generalized Femoral & Inguinal Lymphatics: Bilateral - Description - Normal. Tenderness - Non Tender.    Assessment & Plan (Maliya Marich A. 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 Small risk of malignancy with papilloma with atypia. RIGHT BREAST SEED LOCALIZED LUMPECTOMY RECOMMENDED. Risk of lumpectomy include bleeding, infection, seroma, more surgery, use of seed/wire, wound care, cosmetic deformity and the need for other treatments, death , blood clots, death. Pt agrees to proceed.  Current Plans Pt Education - [facility] Breast Biopsy Instructions (FLB): discussed with patient and provided information.

## 2014-08-08 ENCOUNTER — Other Ambulatory Visit (INDEPENDENT_AMBULATORY_CARE_PROVIDER_SITE_OTHER): Payer: Self-pay | Admitting: Surgery

## 2014-08-08 DIAGNOSIS — D241 Benign neoplasm of right breast: Secondary | ICD-10-CM

## 2014-08-10 ENCOUNTER — Other Ambulatory Visit (INDEPENDENT_AMBULATORY_CARE_PROVIDER_SITE_OTHER): Payer: Self-pay | Admitting: Surgery

## 2014-08-10 DIAGNOSIS — D241 Benign neoplasm of right breast: Secondary | ICD-10-CM

## 2014-08-21 ENCOUNTER — Telehealth: Payer: Self-pay | Admitting: Family Medicine

## 2014-08-21 DIAGNOSIS — M25561 Pain in right knee: Secondary | ICD-10-CM

## 2014-08-21 DIAGNOSIS — M25569 Pain in unspecified knee: Secondary | ICD-10-CM

## 2014-08-21 DIAGNOSIS — M25562 Pain in left knee: Principal | ICD-10-CM

## 2014-08-22 NOTE — Telephone Encounter (Signed)
Ok to refer to Doc she requests for knee pain ,I will sign

## 2014-08-22 NOTE — Telephone Encounter (Signed)
Is this referral ok?

## 2014-08-22 NOTE — Telephone Encounter (Signed)
Will follow up with patient to get which knee she is having problems with

## 2014-08-29 NOTE — Addendum Note (Signed)
Addended by: Denman George B on: 08/29/2014 08:52 AM   Modules accepted: Orders

## 2014-08-29 NOTE — Telephone Encounter (Signed)
Referral entered  

## 2014-08-29 NOTE — Addendum Note (Signed)
Addended by: Denman George B on: 08/29/2014 09:05 AM   Modules accepted: Orders

## 2014-08-29 NOTE — Telephone Encounter (Signed)
Patient states that it is bilateral knee pain but right is worse than left.

## 2014-09-13 ENCOUNTER — Other Ambulatory Visit: Payer: Self-pay | Admitting: Family Medicine

## 2014-09-26 ENCOUNTER — Encounter (HOSPITAL_BASED_OUTPATIENT_CLINIC_OR_DEPARTMENT_OTHER): Payer: Self-pay | Admitting: *Deleted

## 2014-09-26 NOTE — Progress Notes (Signed)
To come in for labs after seeds done 10/04/14 Bring all meds dos

## 2014-09-28 ENCOUNTER — Ambulatory Visit (HOSPITAL_COMMUNITY)
Admission: RE | Admit: 2014-09-28 | Discharge: 2014-09-28 | Disposition: A | Discharge status: Home / Self Care | Payer: Medicare HMO | Source: Ambulatory Visit | Attending: Family Medicine | Admitting: Family Medicine

## 2014-09-28 ENCOUNTER — Ambulatory Visit (INDEPENDENT_AMBULATORY_CARE_PROVIDER_SITE_OTHER): Payer: Medicare HMO | Admitting: Family Medicine

## 2014-09-28 ENCOUNTER — Encounter: Payer: Self-pay | Admitting: Family Medicine

## 2014-09-28 VITALS — BP 132/76 | HR 97 | Resp 18 | Ht 65.0 in | Wt 190.0 lb

## 2014-09-28 DIAGNOSIS — D241 Benign neoplasm of right breast: Secondary | ICD-10-CM | POA: Diagnosis not present

## 2014-09-28 DIAGNOSIS — E1121 Type 2 diabetes mellitus with diabetic nephropathy: Secondary | ICD-10-CM | POA: Diagnosis not present

## 2014-09-28 DIAGNOSIS — M5134 Other intervertebral disc degeneration, thoracic region: Secondary | ICD-10-CM | POA: Insufficient documentation

## 2014-09-28 DIAGNOSIS — M546 Pain in thoracic spine: Secondary | ICD-10-CM

## 2014-09-28 DIAGNOSIS — I10 Essential (primary) hypertension: Secondary | ICD-10-CM

## 2014-09-28 DIAGNOSIS — E785 Hyperlipidemia, unspecified: Secondary | ICD-10-CM

## 2014-09-28 DIAGNOSIS — Z23 Encounter for immunization: Secondary | ICD-10-CM | POA: Insufficient documentation

## 2014-09-28 DIAGNOSIS — E669 Obesity, unspecified: Secondary | ICD-10-CM | POA: Diagnosis not present

## 2014-09-28 DIAGNOSIS — M47814 Spondylosis without myelopathy or radiculopathy, thoracic region: Secondary | ICD-10-CM | POA: Diagnosis not present

## 2014-09-28 NOTE — Progress Notes (Signed)
Subjective:    Patient ID: Jillian [name], female    DOB: 14-Mar-1941, 74 y.o.   MRN: [id number]  HPI The PT is here for follow up and re-evaluation of chronic medical conditions, medication management and review of any available recent lab and radiology data.  Preventive health is updated, specifically  Cancer screening and Immunization.   Questions or concerns regarding consultations or procedures which the PT has had in the interim are  addressed. The PT denies any adverse reactions to current medications since the last visit. No benefit on hot flashes from effexor so she has stopped taking them  3 day h/o upper back pain , aggravated by upper body movement, no cough, fever, chills, shortness of breath or hemoptysis, pain  Pills afford some relief Has upcoming breast surgery next week and will get all lab work at the same time, wants my opinion as to whether I recommend surgery , i advise her that I absolutely do, she has intraduct papilloma and atypical cells Denies polyuria, polydipsia, blurred vision , or hypoglycemic episodes. increased stress at home due to issues with spouse but has good support both from her family and his     Review of Systems See HPI Denies recent fever or chills. Denies sinus pressure, nasal congestion, ear pain or sore throat. Denies chest congestion, productive cough or wheezing. Denies chest pains, palpitations and leg swelling Denies abdominal pain, nausea, vomiting,diarrhea or constipation.   Denies dysuria, frequency, hesitancy or incontinence.  Denies headaches, seizures, numbness, or tingling. Denies depression, anxiety or insomnia. Denies skin break down or rash.        Objective:   Physical Exam  BP 132/76 mmHg  Pulse 97  Resp 18  Ht 5\' 5"  (1.651 m)  Wt 190 lb 0.6 oz (86.202 kg)  BMI 31.62 kg/m2  SpO2 100%   Patient alert and oriented and in no cardiopulmonary distress.  HEENT: No facial asymmetry, EOMI,   oropharynx pink and  moist.  Neck supple no JVD, no mass.  Chest: Clear to auscultation bilaterally.  CVS: S1, S2 no murmurs, no S3.Regular rate.  ABD: Soft non tender.   Ext: No edema  MS: Adequate ROM spine, shoulders, hips and knees.  Skin: Intact, no ulcerations or rash noted.  Psych: Good eye contact, normal affect. Memory intact not anxious or depressed appearing.  CNS: CN 2-12 intact, power,  normal throughout.no focal deficits noted.        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 The importance of attaining a healthy weight is also discussed.   Controlled diabetes mellitus with nephropathy Updated lab needed  Patient advised to reduce carb and sweets, commit to regular physical activity, take meds as prescribed, test blood as directed, and attempt to lose weight, to improve blood sugar control.   Hyperlipidemia with target LDL less than 100 Controlled, no change in medication Hyperlipidemia:Low fat diet discussed and encouraged.    Need for vaccination with 13-polyvalent pneumococcal conjugate vaccine After obtaining informed consent, the immunization is given by nurse.   Obesity (BMI 30.0-34.9) Unchnaged Patient re-educated about  the importance of commitment to a  minimum of 150 minutes of exercise per week. The importance of healthy food choices with portion control discussed. Encouraged to start a food diary, count calories and to consider  joining a support group. Sample diet sheets offered. Goals set by the patient for the next several months.       Papilloma of right breast Lumpectomy scheduled for the following week

## 2014-09-28 NOTE — Patient Instructions (Signed)
F/u in 4 month, call if you need me before  Xray of upper back today, use tylenol for pain 500 mg up to 3 per day for next 3 days, call if persists  Prevnar today  All the best with upcoming surgery  All the best for 2016  Blood pressure is good  I will look out for lab work

## 2014-09-28 NOTE — Assessment & Plan Note (Addendum)
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

## 2014-09-29 DIAGNOSIS — D241 Benign neoplasm of right breast: Secondary | ICD-10-CM | POA: Insufficient documentation

## 2014-09-29 NOTE — Assessment & Plan Note (Signed)
After obtaining informed consent, the immunization is given by nurse .  

## 2014-09-29 NOTE — Assessment & Plan Note (Signed)
Controlled, no change in medication Hyperlipidemia:Low fat diet discussed and encouraged.  \ 

## 2014-09-29 NOTE — Assessment & Plan Note (Signed)
Lumpectomy scheduled for the following week

## 2014-09-29 NOTE — Assessment & Plan Note (Signed)
Controlled, no change in medication DASH diet and commitment to daily physical activity for a minimum of 30 minutes discussed and encouraged, as a part of hypertension management. The importance of attaining a healthy weight is also discussed.  

## 2014-09-29 NOTE — Assessment & Plan Note (Signed)
Updated lab needed  Patient advised to reduce carb and sweets, commit to regular physical activity, take meds as prescribed, test blood as directed, and attempt to lose weight, to improve blood sugar control.

## 2014-09-29 NOTE — Assessment & Plan Note (Signed)
Unchnaged. Patient re-educated about  the importance of commitment to a  minimum of 150 minutes of exercise per week. The importance of healthy food choices with portion control discussed. Encouraged to start a food diary, count calories and to consider  joining a support group. Sample diet sheets offered. Goals set by the patient for the next several months.    

## 2014-10-02 ENCOUNTER — Ambulatory Visit: Payer: Medicare HMO | Admitting: Family Medicine

## 2014-10-04 ENCOUNTER — Ambulatory Visit
Admission: RE | Admit: 2014-10-04 | Discharge: 2014-10-04 | Disposition: A | Discharge status: Home / Self Care | Payer: Commercial Managed Care - HMO | Source: Ambulatory Visit | Attending: Surgery | Admitting: Surgery

## 2014-10-04 ENCOUNTER — Encounter (HOSPITAL_BASED_OUTPATIENT_CLINIC_OR_DEPARTMENT_OTHER)
Admission: RE | Admit: 2014-10-04 | Discharge: 2014-10-04 | Disposition: A | Discharge status: Home / Self Care | Payer: Commercial Managed Care - HMO | Source: Ambulatory Visit | Attending: Surgery | Admitting: Surgery

## 2014-10-04 DIAGNOSIS — D241 Benign neoplasm of right breast: Secondary | ICD-10-CM | POA: Diagnosis not present

## 2014-10-04 DIAGNOSIS — Z888 Allergy status to other drugs, medicaments and biological substances status: Secondary | ICD-10-CM | POA: Diagnosis not present

## 2014-10-04 DIAGNOSIS — I10 Essential (primary) hypertension: Secondary | ICD-10-CM | POA: Diagnosis not present

## 2014-10-04 DIAGNOSIS — E780 Pure hypercholesterolemia: Secondary | ICD-10-CM | POA: Diagnosis not present

## 2014-10-04 DIAGNOSIS — Z885 Allergy status to narcotic agent status: Secondary | ICD-10-CM | POA: Diagnosis not present

## 2014-10-04 DIAGNOSIS — M1990 Unspecified osteoarthritis, unspecified site: Secondary | ICD-10-CM | POA: Diagnosis not present

## 2014-10-04 DIAGNOSIS — E119 Type 2 diabetes mellitus without complications: Secondary | ICD-10-CM | POA: Diagnosis not present

## 2014-10-04 DIAGNOSIS — N6091 Unspecified benign mammary dysplasia of right breast: Secondary | ICD-10-CM | POA: Diagnosis not present

## 2014-10-04 LAB — CBC WITH DIFFERENTIAL/PLATELET
Basophils Absolute: 0 10*3/uL (ref 0.0–0.1)
Basophils Relative: 0 % (ref 0–1)
EOS PCT: 1 % (ref 0–5)
Eosinophils Absolute: 0 10*3/uL (ref 0.0–0.7)
HCT: 38.7 % (ref 36.0–46.0)
Hemoglobin: 12.5 g/dL (ref 12.0–15.0)
Lymphocytes Relative: 41 % (ref 12–46)
Lymphs Abs: 2 10*3/uL (ref 0.7–4.0)
MCH: 30.2 pg (ref 26.0–34.0)
MCHC: 32.3 g/dL (ref 30.0–36.0)
MCV: 93.5 fL (ref 78.0–100.0)
MONO ABS: 0.4 10*3/uL (ref 0.1–1.0)
Monocytes Relative: 8 % (ref 3–12)
NEUTROS ABS: 2.5 10*3/uL (ref 1.7–7.7)
NEUTROS PCT: 50 % (ref 43–77)
PLATELETS: 200 10*3/uL (ref 150–400)
RBC: 4.14 MIL/uL (ref 3.87–5.11)
RDW: 14.8 % (ref 11.5–15.5)
WBC: 5 10*3/uL (ref 4.0–10.5)

## 2014-10-04 LAB — COMPREHENSIVE METABOLIC PANEL
ALK PHOS: 68 U/L (ref 39–117)
ALT: 14 U/L (ref 0–35)
AST: 18 U/L (ref 0–37)
Albumin: 4 g/dL (ref 3.5–5.2)
Anion gap: 10 (ref 5–15)
BILIRUBIN TOTAL: 0.4 mg/dL (ref 0.3–1.2)
BUN: 12 mg/dL (ref 6–23)
CHLORIDE: 106 meq/L (ref 96–112)
CO2: 25 mmol/L (ref 19–32)
CREATININE: 1.18 mg/dL — AB (ref 0.50–1.10)
Calcium: 9.7 mg/dL (ref 8.4–10.5)
GFR calc Af Amer: 52 mL/min — ABNORMAL LOW (ref >90)
GFR, EST NON AFRICAN AMERICAN: 45 mL/min — AB (ref >90)
Glucose, Bld: 81 mg/dL (ref 70–99)
POTASSIUM: 4.5 mmol/L (ref 3.5–5.1)
Sodium: 141 mmol/L (ref 135–145)
Total Protein: 7.4 g/dL (ref 6.0–8.3)

## 2014-10-05 ENCOUNTER — Ambulatory Visit (HOSPITAL_BASED_OUTPATIENT_CLINIC_OR_DEPARTMENT_OTHER): Payer: Commercial Managed Care - HMO | Admitting: Anesthesiology

## 2014-10-05 ENCOUNTER — Encounter (HOSPITAL_BASED_OUTPATIENT_CLINIC_OR_DEPARTMENT_OTHER)
Admission: RE | Disposition: A | Discharge status: Home / Self Care | Payer: Self-pay | Source: Ambulatory Visit | Attending: Surgery

## 2014-10-05 ENCOUNTER — Ambulatory Visit
Admission: RE | Admit: 2014-10-05 | Discharge: 2014-10-05 | Disposition: A | Discharge status: Home / Self Care | Payer: Commercial Managed Care - HMO | Source: Ambulatory Visit | Attending: Surgery | Admitting: Surgery

## 2014-10-05 ENCOUNTER — Ambulatory Visit (HOSPITAL_BASED_OUTPATIENT_CLINIC_OR_DEPARTMENT_OTHER)
Admission: RE | Admit: 2014-10-05 | Discharge: 2014-10-05 | Disposition: A | Discharge status: Home / Self Care | Payer: Commercial Managed Care - HMO | Source: Ambulatory Visit | Attending: Surgery | Admitting: Surgery

## 2014-10-05 ENCOUNTER — Encounter (HOSPITAL_COMMUNITY): Payer: Self-pay | Admitting: Gastroenterology

## 2014-10-05 DIAGNOSIS — E780 Pure hypercholesterolemia: Secondary | ICD-10-CM | POA: Diagnosis not present

## 2014-10-05 DIAGNOSIS — E119 Type 2 diabetes mellitus without complications: Secondary | ICD-10-CM | POA: Insufficient documentation

## 2014-10-05 DIAGNOSIS — Z885 Allergy status to narcotic agent status: Secondary | ICD-10-CM | POA: Insufficient documentation

## 2014-10-05 DIAGNOSIS — M1990 Unspecified osteoarthritis, unspecified site: Secondary | ICD-10-CM | POA: Diagnosis not present

## 2014-10-05 DIAGNOSIS — N6081 Other benign mammary dysplasias of right breast: Secondary | ICD-10-CM | POA: Diagnosis not present

## 2014-10-05 DIAGNOSIS — D241 Benign neoplasm of right breast: Secondary | ICD-10-CM | POA: Insufficient documentation

## 2014-10-05 DIAGNOSIS — Z888 Allergy status to other drugs, medicaments and biological substances status: Secondary | ICD-10-CM | POA: Insufficient documentation

## 2014-10-05 DIAGNOSIS — N6091 Unspecified benign mammary dysplasia of right breast: Secondary | ICD-10-CM | POA: Diagnosis not present

## 2014-10-05 DIAGNOSIS — I10 Essential (primary) hypertension: Secondary | ICD-10-CM | POA: Insufficient documentation

## 2014-10-05 HISTORY — DX: Presence of spectacles and contact lenses: Z97.3

## 2014-10-05 HISTORY — PX: BREAST LUMPECTOMY WITH RADIOACTIVE SEED LOCALIZATION: SHX6424

## 2014-10-05 LAB — GLUCOSE, CAPILLARY
GLUCOSE-CAPILLARY: 92 mg/dL (ref 70–99)
Glucose-Capillary: 104 mg/dL — ABNORMAL HIGH (ref 70–99)

## 2014-10-05 LAB — POCT HEMOGLOBIN-HEMACUE: Hemoglobin: 11.4 g/dL — ABNORMAL LOW (ref 12.0–15.0)

## 2014-10-05 SURGERY — BREAST LUMPECTOMY WITH RADIOACTIVE SEED LOCALIZATION
Anesthesia: General | Site: Breast | Laterality: Right

## 2014-10-05 MED ORDER — MIDAZOLAM HCL 2 MG/2ML IJ SOLN
INTRAMUSCULAR | Status: AC
  Filled 2014-10-05: qty 2

## 2014-10-05 MED ORDER — ONDANSETRON HCL 4 MG/2ML IJ SOLN
INTRAMUSCULAR | Status: DC | PRN
  Administered 2014-10-05: 4 mg via INTRAVENOUS

## 2014-10-05 MED ORDER — OXYCODONE HCL 5 MG/5ML PO SOLN
5.0000 mg | Freq: Once | ORAL | Status: AC | PRN
Start: 2014-10-05 — End: 2014-10-05

## 2014-10-05 MED ORDER — MIDAZOLAM HCL 2 MG/2ML IJ SOLN: 1.0000 mg | INTRAMUSCULAR | Status: DC | PRN

## 2014-10-05 MED ORDER — FENTANYL CITRATE 0.05 MG/ML IJ SOLN: 50.0000 ug | INTRAMUSCULAR | Status: DC | PRN

## 2014-10-05 MED ORDER — FENTANYL CITRATE 0.05 MG/ML IJ SOLN
INTRAMUSCULAR | Status: AC
  Filled 2014-10-05: qty 4

## 2014-10-05 MED ORDER — FENTANYL CITRATE 0.05 MG/ML IJ SOLN
INTRAMUSCULAR | Status: DC | PRN
  Administered 2014-10-05 (×2): 50 ug via INTRAVENOUS

## 2014-10-05 MED ORDER — LACTATED RINGERS IV SOLN
INTRAVENOUS | Status: DC
  Administered 2014-10-05: 07:00:00 via INTRAVENOUS
  Administered 2014-10-05: 10 mL/h via INTRAVENOUS

## 2014-10-05 MED ORDER — MIDAZOLAM HCL 5 MG/5ML IJ SOLN
INTRAMUSCULAR | Status: DC | PRN
  Administered 2014-10-05: 2 mg via INTRAVENOUS

## 2014-10-05 MED ORDER — PROPOFOL 10 MG/ML IV BOLUS
INTRAVENOUS | Status: AC
  Filled 2014-10-05: qty 20

## 2014-10-05 MED ORDER — OXYCODONE-ACETAMINOPHEN 5-325 MG PO TABS: 1.0000 | ORAL_TABLET | ORAL | Status: DC | PRN

## 2014-10-05 MED ORDER — BUPIVACAINE-EPINEPHRINE (PF) 0.25% -1:200000 IJ SOLN
INTRAMUSCULAR | Status: AC
  Filled 2014-10-05: qty 30

## 2014-10-05 MED ORDER — PROPOFOL 10 MG/ML IV BOLUS
INTRAVENOUS | Status: DC | PRN
  Administered 2014-10-05: 150 mg via INTRAVENOUS

## 2014-10-05 MED ORDER — LIDOCAINE HCL (CARDIAC) 20 MG/ML IV SOLN
INTRAVENOUS | Status: DC | PRN
  Administered 2014-10-05: 40 mg via INTRAVENOUS

## 2014-10-05 MED ORDER — CHLORHEXIDINE GLUCONATE 4 % EX LIQD: 1.0000 "application " | Freq: Once | CUTANEOUS | Status: DC

## 2014-10-05 MED ORDER — DEXAMETHASONE SODIUM PHOSPHATE 4 MG/ML IJ SOLN
INTRAMUSCULAR | Status: DC | PRN
  Administered 2014-10-05: 4 mg via INTRAVENOUS

## 2014-10-05 MED ORDER — CEFAZOLIN SODIUM-DEXTROSE 2-3 GM-% IV SOLR
INTRAVENOUS | Status: AC
  Filled 2014-10-05: qty 50

## 2014-10-05 MED ORDER — FENTANYL CITRATE 0.05 MG/ML IJ SOLN: 25.0000 ug | INTRAMUSCULAR | Status: DC | PRN

## 2014-10-05 MED ORDER — CEFAZOLIN SODIUM-DEXTROSE 2-3 GM-% IV SOLR
2.0000 g | INTRAVENOUS | Status: AC
Start: 2014-10-05 — End: 2014-10-05
  Administered 2014-10-05: 2 g via INTRAVENOUS

## 2014-10-05 MED ORDER — BUPIVACAINE-EPINEPHRINE (PF) 0.25% -1:200000 IJ SOLN
INTRAMUSCULAR | Status: DC | PRN
  Administered 2014-10-05: 10 mL

## 2014-10-05 MED ORDER — EPHEDRINE SULFATE 50 MG/ML IJ SOLN
INTRAMUSCULAR | Status: DC | PRN
Start: 2014-10-05 — End: 2014-10-05
  Administered 2014-10-05: 10 mg via INTRAVENOUS

## 2014-10-05 MED ORDER — ONDANSETRON HCL 4 MG/2ML IJ SOLN: 4.0000 mg | Freq: Once | INTRAMUSCULAR | Status: DC | PRN

## 2014-10-05 MED ORDER — OXYCODONE HCL 5 MG PO TABS
ORAL_TABLET | ORAL | Status: AC
  Filled 2014-10-05: qty 1

## 2014-10-05 MED ORDER — OXYCODONE HCL 5 MG PO TABS
5.0000 mg | ORAL_TABLET | Freq: Once | ORAL | Status: AC | PRN
  Administered 2014-10-05: 5 mg via ORAL

## 2014-10-05 SURGICAL SUPPLY — 50 items
APPLIER CLIP 9.375 MED OPEN (MISCELLANEOUS)
BINDER BREAST LRG (GAUZE/BANDAGES/DRESSINGS) IMPLANT
BINDER BREAST MEDIUM (GAUZE/BANDAGES/DRESSINGS) IMPLANT
BINDER BREAST XLRG (GAUZE/BANDAGES/DRESSINGS) IMPLANT
BINDER BREAST XXLRG (GAUZE/BANDAGES/DRESSINGS) ×3 IMPLANT
BLADE SURG 15 STRL LF DISP TIS (BLADE) ×1 IMPLANT
BLADE SURG 15 STRL SS (BLADE) ×2
CANISTER SUC SOCK COL 7IN (MISCELLANEOUS) ×3 IMPLANT
CANISTER SUCT 1200ML W/VALVE (MISCELLANEOUS) IMPLANT
CHLORAPREP W/TINT 26ML (MISCELLANEOUS) ×3 IMPLANT
CLIP APPLIE 9.375 MED OPEN (MISCELLANEOUS) IMPLANT
CLIP TI WIDE RED SMALL 6 (CLIP) ×3 IMPLANT
COVER BACK TABLE 60X90IN (DRAPES) ×3 IMPLANT
COVER MAYO STAND STRL (DRAPES) ×3 IMPLANT
COVER PROBE W GEL 5X96 (DRAPES) ×3 IMPLANT
DECANTER SPIKE VIAL GLASS SM (MISCELLANEOUS) IMPLANT
DEVICE DUBIN W/COMP PLATE 8390 (MISCELLANEOUS) ×3 IMPLANT
DRAPE LAPAROSCOPIC ABDOMINAL (DRAPES) IMPLANT
DRAPE PED LAPAROTOMY (DRAPES) ×3 IMPLANT
DRAPE UTILITY XL STRL (DRAPES) ×3 IMPLANT
ELECT COATED BLADE 2.86 ST (ELECTRODE) ×3 IMPLANT
ELECT REM PT RETURN 9FT ADLT (ELECTROSURGICAL) ×3
ELECTRODE REM PT RTRN 9FT ADLT (ELECTROSURGICAL) ×1 IMPLANT
GLOVE BIOGEL PI IND STRL 7.0 (GLOVE) ×2 IMPLANT
GLOVE BIOGEL PI IND STRL 8 (GLOVE) ×1 IMPLANT
GLOVE BIOGEL PI INDICATOR 7.0 (GLOVE) ×4
GLOVE BIOGEL PI INDICATOR 8 (GLOVE) ×2
GLOVE ECLIPSE 6.5 STRL STRAW (GLOVE) ×3 IMPLANT
GLOVE ECLIPSE 8.0 STRL XLNG CF (GLOVE) ×3 IMPLANT
GOWN STRL REUS W/ TWL LRG LVL3 (GOWN DISPOSABLE) ×2 IMPLANT
GOWN STRL REUS W/TWL LRG LVL3 (GOWN DISPOSABLE) ×4
KIT MARKER MARGIN INK (KITS) ×3 IMPLANT
LIQUID BAND (GAUZE/BANDAGES/DRESSINGS) ×3 IMPLANT
NEEDLE HYPO 25X1 1.5 SAFETY (NEEDLE) ×3 IMPLANT
NS IRRIG 1000ML POUR BTL (IV SOLUTION) ×3 IMPLANT
PACK BASIN DAY SURGERY FS (CUSTOM PROCEDURE TRAY) ×3 IMPLANT
PENCIL BUTTON HOLSTER BLD 10FT (ELECTRODE) ×3 IMPLANT
SLEEVE SCD COMPRESS KNEE MED (MISCELLANEOUS) ×3 IMPLANT
SPONGE LAP 4X18 X RAY DECT (DISPOSABLE) ×3 IMPLANT
STAPLER VISISTAT 35W (STAPLE) IMPLANT
SUT MNCRL AB 4-0 PS2 18 (SUTURE) ×3 IMPLANT
SUT SILK 2 0 SH (SUTURE) IMPLANT
SUT VIC AB 3-0 SH 27 (SUTURE) ×2
SUT VIC AB 3-0 SH 27X BRD (SUTURE) ×1 IMPLANT
SYR CONTROL 10ML LL (SYRINGE) ×3 IMPLANT
TOWEL OR 17X24 6PK STRL BLUE (TOWEL DISPOSABLE) ×3 IMPLANT
TOWEL OR NON WOVEN STRL DISP B (DISPOSABLE) ×3 IMPLANT
TUBE CONNECTING 20'X1/4 (TUBING)
TUBE CONNECTING 20X1/4 (TUBING) IMPLANT
YANKAUER SUCT BULB TIP NO VENT (SUCTIONS) IMPLANT

## 2014-10-05 NOTE — H&P (Signed)
H&P   YULIANA VANDRUNEN (MR# [id number])      H&P Info    Author Note Status Last Update User Last Update Date/Time   Erroll Luna, MD Signed Erroll Luna, MD 08/04/2014 11:01 AM    H&P    Expand All Collapse All   Jillian [name] 08/04/2014 10:34 AM Location: [facility] Patient #: [id number] DOB: 1941-02-14 Married / Language: English / Race: Black or African American Female History of Present Illness Marcello Moores A. Jalayia Bagheri MD; 08/04/2014 10:58 AM) Patient words: eval right breast   pt sent at the request of dr simpson for abnormal mamogram. Pt denies breast pain, nipple dischare, mass or other problem. no hx of cancer. first biopsy.  CLINICAL DATA: Suspicious right breast mass status post ultrasound-guided core needle biopsy  EXAM: DIAGNOSTIC RIGHT MAMMOGRAM POST ULTRASOUND BIOPSY  COMPARISON: Previous exams  FINDINGS: Mammographic images were obtained following ultrasound guided biopsy of suspicious right breast mass. Ribbon shaped marking clip is in appropriate position along the anterior margin of the tubular shaped right breast mass.  IMPRESSION: Ribbon shaped marking clip along the anterior margin of the tubular shaped right breast mass.  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 - ASSOCIATED CALCIFICATIONS. Microscopic Comment The findings are called to the [facility] on 07/12/14. Dr. Lyndon Code has seen this.  The patient is a 74 year old female   Other Problems Marjean Donna, [place]; 08/04/2014 10:35 AM) Arthritis Diabetes Mellitus High blood pressure Hypercholesterolemia  Past Surgical History Marjean Donna, [place]; 08/04/2014 10:35 AM) Appendectomy Breast Biopsy Right. Cataract Surgery Bilateral. Hysterectomy (not due to cancer) - Complete  Diagnostic Studies History Marjean Donna, CMA; 08/04/2014 10:35 AM) Colonoscopy 5-10 years ago Mammogram within last year  Allergies Marjean Donna, [place]; 08/04/2014 10:35 AM) No Known Drug Allergies 08/04/2014  Medication History (Sonya Bynum, CMA; 08/04/2014 10:36 AM) Benazepril HCl (10MG  Tablet, Oral) Active. Pravastatin Sodium (20MG  Tablet, Oral) Active. Venlafaxine HCl ER (37.5MG  Capsule ER 24HR, Oral) Active. MetFORMIN HCl ER (500MG  Tablet ER 24HR, Oral) Active. Ibuprofen (600MG  Tablet, Oral) Active. CloNIDine HCl (0.1MG  Tablet, Oral) Active.  Social History ([place]; 08/04/2014 10:35 AM) Caffeine use Coffee, Tea. No alcohol use No drug use Tobacco use Never smoker.  Family History Marjean Donna, [place]; 08/04/2014 10:35 AM) Arthritis Sister. Diabetes Mellitus Brother. Hypertension Brother.  Pregnancy / Birth History Marjean Donna, [place]; 08/04/2014 10:35 AM) Age at menarche 53 years. Gravida 3 Maternal age 76-20 Para 2     Review of Systems ([place]; 08/04/2014 10:35 AM) General Present- Night Sweats. Not Present- Appetite Loss, Chills, Fatigue, Fever, Weight Gain and Weight Loss. Skin Not Present- Change in Wart/Mole, Dryness, Hives, Jaundice, New Lesions, Non-Healing Wounds, Rash and Ulcer. HEENT Present- Wears glasses/contact lenses. Not Present- Earache, Hearing Loss, Hoarseness, Nose Bleed, Oral Ulcers, Ringing in the Ears, Seasonal Allergies, Sinus Pain, Sore Throat, Visual Disturbances and Yellow Eyes. Respiratory Not Present- Bloody sputum, Chronic Cough, Difficulty Breathing, Snoring and Wheezing. Breast Not Present- Breast Mass, Breast Pain, Nipple Discharge and Skin Changes. Cardiovascular Not Present- Chest Pain, Difficulty Breathing Lying Down, Leg Cramps, Palpitations, Rapid Heart Rate, Shortness of Breath and Swelling of Extremities. Gastrointestinal Not Present- Abdominal Pain, Bloating, Bloody Stool, Change in Bowel Habits, Chronic diarrhea, Constipation,  Difficulty Swallowing, Excessive gas, Gets full quickly at meals, Hemorrhoids, Indigestion, Nausea, Rectal Pain and Vomiting. Female Genitourinary Not Present- Frequency,  Nocturia, Painful Urination, Pelvic Pain and Urgency. Musculoskeletal Present- Joint Pain. Not Present- Back Pain, Joint Stiffness, Muscle Pain, Muscle Weakness and Swelling of Extremities. Psychiatric Not Present- Anxiety, Bipolar, Change in Sleep Pattern, Depression, Fearful and Frequent crying. Endocrine Present- Hot flashes. Not Present- Cold Intolerance, Excessive Hunger, Hair Changes, Heat Intolerance and New Diabetes. Hematology Not Present- Easy Bruising, Excessive bleeding, Gland problems, HIV and Persistent Infections.  Vitals (Sonya Bynum CMA; 08/04/2014 10:37 AM) 08/04/2014 10:36 AM Weight: 190 lb Height: 65in Body Surface Area: 1.99 m Body Mass Index: 31.62 kg/m Temp.: 70F(Temporal)  Pulse: 78 (Regular)  BP: 126/74 (Sitting, Left Arm, Standard)     Physical Exam (Ruchel Brandenburger A. Michaelyn Wall MD; 08/04/2014 10:58 AM)  General Mental Status-Alert. General Appearance-Consistent with stated age. Hydration-Well hydrated. Voice-Normal.  Head and Neck Head-normocephalic, atraumatic with no lesions or palpable masses. Trachea-midline. Thyroid Gland Characteristics - normal size and consistency.  Eye Eyeball - Bilateral-Extraocular movements intact. Sclera/Conjunctiva - Bilateral-No scleral icterus.  Chest and Lung Exam Chest and lung exam reveals -quiet, even and easy respiratory effort with no use of accessory muscles and on auscultation, normal breath sounds, no adventitious sounds and normal vocal resonance. Inspection Chest Wall - Normal. Back - normal.  Breast Breast - Left-Symmetric, Non Tender, No Biopsy scars, no Dimpling, No Inflammation, No Lumpectomy scars, No Mastectomy scars, No Peau d' Orange. Breast - Right-Symmetric, Non Tender, No Biopsy scars, no Dimpling,  No Inflammation, No Lumpectomy scars, No Mastectomy scars, No Peau d' Orange. Breast Lump-No Palpable Breast Mass.  Cardiovascular Cardiovascular examination reveals -normal heart sounds, regular rate and rhythm with no murmurs and normal pedal pulses bilaterally.  Abdomen Inspection Inspection of the abdomen reveals - No Hernias. Skin - Scar - no surgical scars. Palpation/Percussion Palpation and Percussion of the abdomen reveal - Soft, Non Tender, No Rebound tenderness, No Rigidity (guarding) and No hepatosplenomegaly. Auscultation Auscultation of the abdomen reveals - Bowel sounds normal.  Neurologic Neurologic evaluation reveals -alert and oriented x 3 with no impairment of recent or remote memory. Mental Status-Normal.  Musculoskeletal Normal Exam - Left-Upper Extremity Strength Normal and Lower Extremity Strength Normal. Normal Exam - Right-Upper Extremity Strength Normal and Lower Extremity Strength Normal.  Lymphatic Head & Neck  General Head & Neck Lymphatics: Bilateral - Description - Normal. Axillary  General Axillary Region: Bilateral - Description - Normal. Tenderness - Non Tender. Femoral & Inguinal  Generalized Femoral & Inguinal Lymphatics: Bilateral - Description - Normal. Tenderness - Non Tender.    Assessment & Plan (Niki Payment A. 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 Small risk of malignancy with papilloma with atypia. RIGHT BREAST SEED LOCALIZED LUMPECTOMY RECOMMENDED. Risk of lumpectomy include bleeding, infection, seroma, more surgery, use of seed/wire, wound care, cosmetic deformity and the need for other treatments, death , blood clots, death. Pt agrees to proceed.  Current Plans Pt Education - CSS Breast Biopsy Instructions (FLB): discussed with patient and provided information.

## 2014-10-05 NOTE — Transfer of Care (Signed)
Immediate Anesthesia Transfer of Care Note  Patient: Jillian [name]  Procedure(s) Performed: Procedure(s): BREAST LUMPECTOMY WITH RADIOACTIVE SEED LOCALIZATION (Right)  Patient Location: PACU  Anesthesia Type:General  Level of Consciousness: sedated  Airway & Oxygen Therapy: Patient Spontanous Breathing and Patient connected to face mask oxygen  Post-op Assessment: Report given to PACU RN and Post -op Vital signs reviewed and stable  Post vital signs: Reviewed and stable  Complications: No apparent anesthesia complications

## 2014-10-05 NOTE — Discharge Instructions (Signed)
[facility] °Office Phone Number [phone] ° °BREAST BIOPSY/ PARTIAL MASTECTOMY: POST OP INSTRUCTIONS ° °Always review your discharge instruction sheet given to you by the facility where your surgery was performed. ° °IF YOU HAVE DISABILITY OR FAMILY LEAVE FORMS, YOU MUST BRING THEM TO THE OFFICE FOR PROCESSING.  DO NOT GIVE THEM TO YOUR DOCTOR. ° °1. A prescription for pain medication may be given to you upon discharge.  Take your pain medication as prescribed, if needed.  If narcotic pain medicine is not needed, then you may take acetaminophen (Tylenol) or ibuprofen (Advil) as needed. °2. Take your usually prescribed medications unless otherwise directed °3. If you need a refill on your pain medication, please contact your pharmacy.  They will contact our office to request authorization.  Prescriptions will not be filled after 5pm or on week-ends. °4. You should eat very light the first 24 hours after surgery, such as soup, crackers, pudding, etc.  Resume your normal diet the day after surgery. °5. Most patients will experience some swelling and bruising in the breast.  Ice packs and a good support bra will help.  Swelling and bruising can take several days to resolve.  °6. It is common to experience some constipation if taking pain medication after surgery.  Increasing fluid intake and taking a stool softener will usually help or prevent this problem from occurring.  A mild laxative (Milk of Magnesia or Miralax) should be taken according to package directions if there are no bowel movements after 48 hours. °7. Unless discharge instructions indicate otherwise, you may remove your bandages 24-48 hours after surgery, and you may shower at that time.  You may have steri-strips (small skin tapes) in place directly over the incision.  These strips should be left on the skin for 7-10 days.  If your surgeon used skin glue on the incision, you may shower in 24 hours.  The glue will flake off over the  next 2-3 weeks.  Any sutures or staples will be removed at the office during your follow-up visit. °8. ACTIVITIES:  You may resume regular daily activities (gradually increasing) beginning the next day.  Wearing a good support bra or sports bra minimizes pain and swelling.  You may have sexual intercourse when it is comfortable. °a. You may drive when you no longer are taking prescription pain medication, you can comfortably wear a seatbelt, and you can safely maneuver your car and apply brakes. °b. RETURN TO WORK:  ______________________________________________________________________________________ °9. You should see your doctor in the office for a follow-up appointment approximately two weeks after your surgery.  Your doctor’s nurse will typically make your follow-up appointment when she calls you with your pathology report.  Expect your pathology report 2-3 business days after your surgery.  You may call to check if you do not hear from us after three days. °10. OTHER INSTRUCTIONS: _______________________________________________________________________________________________ _____________________________________________________________________________________________________________________________________ °_____________________________________________________________________________________________________________________________________ °_____________________________________________________________________________________________________________________________________ ° °WHEN TO CALL YOUR DOCTOR: °1. Fever over 101.0 °2. Nausea and/or vomiting. °3. Extreme swelling or bruising. °4. Continued bleeding from incision. °5. Increased pain, redness, or drainage from the incision. ° °The clinic staff is available to answer your questions during regular business hours.  Please don’t hesitate to call and ask to speak to one of the nurses for clinical concerns.  If you have a medical emergency, go to the nearest  emergency room or call 911.  A surgeon from [facility] is always on call at the hospital. ° °For further questions, please visit [email or web address]  ° ° °  Post Anesthesia Home Care Instructions ° °Activity: °Get plenty of rest for the remainder of the day. A responsible adult should stay with you for 24 hours following the procedure.  °For the next 24 hours, DO NOT: °-Drive a car °-Operate machinery °-Drink alcoholic beverages °-Take any medication unless instructed by your physician °-Make any legal decisions or sign important papers. ° °Meals: °Start with liquid foods such as gelatin or soup. Progress to regular foods as tolerated. Avoid greasy, spicy, heavy foods. If nausea and/or vomiting occur, drink only clear liquids until the nausea and/or vomiting subsides. Call your physician if vomiting continues. ° °Special Instructions/Symptoms: °Your throat may feel dry or sore from the anesthesia or the breathing tube placed in your throat during surgery. If this causes discomfort, gargle with warm salt water. The discomfort should disappear within 24 hours. ° °

## 2014-10-05 NOTE — Anesthesia Procedure Notes (Signed)
Procedure Name: LMA Insertion Date/Time: 10/05/2014 7:37 AM Performed by: Maryella Shivers Pre-anesthesia Checklist: Patient identified, Emergency Drugs available, Suction available and Patient being monitored Patient Re-evaluated:Patient Re-evaluated prior to inductionOxygen Delivery Method: Circle System Utilized Preoxygenation: Pre-oxygenation with 100% oxygen Intubation Type: IV induction Ventilation: Mask ventilation without difficulty LMA: LMA inserted LMA Size: 4.0 Number of attempts: 1 Airway Equipment and Method: Bite block Placement Confirmation: positive ETCO2 Tube secured with: Tape Dental Injury: Teeth and Oropharynx as per pre-operative assessment

## 2014-10-05 NOTE — Interval H&P Note (Signed)
History and Physical Interval Note:  10/05/2014 7:03 AM  Jillian [name]  has presented today for surgery, with the diagnosis of Right Breat Papilloma  The various methods of treatment have been discussed with the patient and family. After consideration of risks, benefits and other options for treatment, the patient has consented to  Procedure(s): BREAST LUMPECTOMY WITH RADIOACTIVE SEED LOCALIZATION (Right) as a surgical intervention .  The patient's history has been reviewed, patient examined, no change in status, stable for surgery.  I have reviewed the patient's chart and labs.  Questions were answered to the patient's satisfaction.     Gwendolen Hewlett A.

## 2014-10-05 NOTE — Op Note (Signed)
eoperative diagnosis: right  breast papilloma  Postoperative diagnosis: Same   Procedure: right breast seed localized lumpectomy  Surgeon: Erroll Luna M.D.  Anesthesia: Gen. With 0.5% Sensorcaine local with epinephrine   EBL: 20 cc  Specimen: right  breast tissue with clip and radioactive seed in the specimen. Verified with neoprobe and radiographic image showing both seed and clip in specimen  Indications for procedure: The patient presents for right  breast excisional lumpectomy after core biopsy showed papilloma. Discussed the rationale for considering excision. Small risk of malignancy associated with papilloma lesion after core biopsy. Discussed observation. Discussed wire localization. Patient desired excision of left breast papilloma.The procedure has been discussed with the patient. Alternatives to surgery have been discussed with the patient.  Risks of surgery include bleeding,  Infection,  Seroma formation, death,  and the need for further surgery.   The patient understands and wishes to proceed.   Description of procedure: Patient underwent seed placement as an outpatient. Patient presents today for right  breast seed localized lumpectomy. Patient and holding area. Questions are answered and neoprobe used to verify seed location. Patient taken back to the operating room and placed upon the OR table. After induction of general anesthesia, right  breast prepped and draped in a sterile fashion. Timeout was done to verify proper site and  procedure. Neoprobe used and hot spot identified and right breast lower -outer quadrant. This was marked with pen. Transverse  incision made right  Lower  outer quadrant breast. Dissection used with the help of a neoprobe around the tissue where the seed and clip were located. Tissue removed in its entirety with gross margins.. Neoprobe used and seed within specimen. Radiographs taken which show clip and seed  In specimen.hemostasis achieved and cavity  closed with 3-0 Vicryl and 4-0 Monocryl. Dermabond applied. All final counts found to be correct. Specimen transported to pathology. Patient awoke extubated taken to recovery in satisfactory condition.

## 2014-10-05 NOTE — Anesthesia Postprocedure Evaluation (Signed)
  Anesthesia Post-op Note  Patient: Jillian [name]  Procedure(s) Performed: Procedure(s): BREAST LUMPECTOMY WITH RADIOACTIVE SEED LOCALIZATION (Right)  Patient Location: PACU  Anesthesia Type:General and GA combined with regional for post-op pain  Level of Consciousness: awake, alert  and oriented  Airway and Oxygen Therapy: Patient Spontanous Breathing and Patient connected to nasal cannula oxygen  Post-op Pain: mild  Post-op Assessment: Post-op Vital signs reviewed, Patient's Cardiovascular Status Stable, Respiratory Function Stable, Patent Airway and Pain level controlled  Post-op Vital Signs: stable  Last Vitals:  Filed Vitals:   10/05/14 0922  BP: 130/72  Pulse: 89  Temp: 36.4 C  Resp: 18    Complications: No apparent anesthesia complications

## 2014-10-05 NOTE — Anesthesia Preprocedure Evaluation (Addendum)
Anesthesia Evaluation  Patient identified by MRN, date of birth, ID band Patient awake    Reviewed: Allergy & Precautions, NPO status   History of Anesthesia Complications Negative for: history of anesthetic complications  Airway Mallampati: II  TM Distance: >3 FB Neck ROM: Full    Dental  (+) Teeth Intact, Dental Advisory Given   Pulmonary neg pulmonary ROS,  breath sounds clear to auscultation        Cardiovascular hypertension, Pt. on medications Rhythm:Regular Rate:Normal     Neuro/Psych negative neurological ROS     GI/Hepatic   Endo/Other  diabetes  Renal/GU      Musculoskeletal   Abdominal   Peds  Hematology   Anesthesia Other Findings   Reproductive/Obstetrics                           Anesthesia Physical Anesthesia Plan  ASA: III  Anesthesia Plan: General   Post-op Pain Management:    Induction: Intravenous  Airway Management Planned: LMA  Additional Equipment:   Intra-op Plan:   Post-operative Plan:   Informed Consent: I have reviewed the patients History and Physical, chart, labs and discussed the procedure including the risks, benefits and alternatives for the proposed anesthesia with the patient or authorized representative who has indicated his/her understanding and acceptance.   Dental advisory given  Plan Discussed with: CRNA and Anesthesiologist  Anesthesia Plan Comments:         Anesthesia Quick Evaluation

## 2014-10-06 ENCOUNTER — Encounter (HOSPITAL_BASED_OUTPATIENT_CLINIC_OR_DEPARTMENT_OTHER): Payer: Self-pay | Admitting: Surgery

## 2014-10-20 ENCOUNTER — Other Ambulatory Visit (INDEPENDENT_AMBULATORY_CARE_PROVIDER_SITE_OTHER): Payer: Self-pay | Admitting: Surgery

## 2014-10-20 DIAGNOSIS — N6091 Unspecified benign mammary dysplasia of right breast: Secondary | ICD-10-CM

## 2014-10-26 ENCOUNTER — Telehealth: Payer: Self-pay | Admitting: *Deleted

## 2014-10-26 NOTE — Telephone Encounter (Signed)
Received office note from [facility].  Made a copy and placed in Dr. Ernestina Penna box and took a copy to HIM to scan.

## 2014-10-26 NOTE — Telephone Encounter (Signed)
Received referral from [facility].  Called pt and confirmed 11/14/14 high risk appt w/ pt.  Mailed calendar, welcoming packet & intake form to pt.  Emailed Engineer, civil (consulting) at [employer] to make her aware.  All paperwork is in EPIC.

## 2014-11-10 ENCOUNTER — Telehealth: Payer: Self-pay | Admitting: Hematology

## 2014-11-10 NOTE — Telephone Encounter (Signed)
returned call and pt is new pt fwd to [employer]

## 2014-11-13 ENCOUNTER — Telehealth: Payer: Self-pay | Admitting: *Deleted

## 2014-11-13 NOTE — Telephone Encounter (Signed)
Received a message from [facility] in HIM stating the pt called requesting to cancel her appt for 2/23 and wants to reschedule.  Called pt and spoke with her husband.  He states to cancel the appt cause she is out of town and she will call when she gets back to reschedule. Cancelled appt as requested.

## 2014-11-14 ENCOUNTER — Encounter: Payer: Commercial Managed Care - HMO | Admitting: Hematology

## 2014-11-14 ENCOUNTER — Ambulatory Visit: Payer: Commercial Managed Care - HMO

## 2014-11-16 ENCOUNTER — Telehealth: Payer: Self-pay | Admitting: *Deleted

## 2014-11-16 NOTE — Telephone Encounter (Signed)
Received referral from [facility].  Called pt and she would like to be seen in [place] since it is closer to her home.  Emailed Engineer, civil (consulting) at [employer] to send the referral to [facility].  I cancelled the referral in EPIC.

## 2014-12-12 ENCOUNTER — Other Ambulatory Visit: Payer: Self-pay | Admitting: Family Medicine

## 2014-12-13 ENCOUNTER — Encounter (HOSPITAL_COMMUNITY): Payer: Commercial Managed Care - HMO | Attending: Hematology & Oncology | Admitting: Hematology & Oncology

## 2014-12-13 VITALS — BP 142/63 | HR 79 | Temp 97.5°F | Resp 18 | Ht 64.0 in | Wt 191.8 lb

## 2014-12-13 DIAGNOSIS — D241 Benign neoplasm of right breast: Secondary | ICD-10-CM

## 2014-12-13 DIAGNOSIS — N951 Menopausal and female climacteric states: Secondary | ICD-10-CM

## 2014-12-13 DIAGNOSIS — N6091 Unspecified benign mammary dysplasia of right breast: Secondary | ICD-10-CM

## 2014-12-13 DIAGNOSIS — Z808 Family history of malignant neoplasm of other organs or systems: Secondary | ICD-10-CM

## 2014-12-13 MED ORDER — TAMOXIFEN CITRATE 20 MG PO TABS: 20.0000 mg | ORAL_TABLET | Freq: Every day | ORAL | Status: DC

## 2014-12-13 NOTE — Patient Instructions (Signed)
[place] at [facility] Discharge Instructions  RECOMMENDATIONS MADE BY THE CONSULTANT AND ANY TEST RESULTS WILL BE SENT TO YOUR REFERRING PHYSICIAN.  A prescription for Tamoxifen was sent to your pharmacy. Take as directed. Return to clinic in 1 month for follow up. Tamoxifen oral tablet What is this medicine? TAMOXIFEN (ta MOX i fen) blocks the effects of estrogen. It is commonly used to treat breast cancer. It is also used to decrease the chance of breast cancer coming back in women who have received treatment for the disease. It may also help prevent breast cancer in women who have a high risk of developing breast cancer. This medicine may be used for other purposes; ask your health care provider or pharmacist if you have questions. COMMON BRAND NAME(S): Nolvadex What should I tell my health care provider before I take this medicine? They need to know if you have any of these conditions: -blood clots -blood disease -cataracts or impaired eyesight -endometriosis -high calcium levels -high cholesterol -irregular menstrual cycles -liver disease -stroke -uterine fibroids -an unusual or allergic reaction to tamoxifen, other medicines, foods, dyes, or preservatives -pregnant or trying to get pregnant -breast-feeding How should I use this medicine? Take this medicine by mouth with a glass of water. Follow the directions on the prescription label. You can take it with or without food. Take your medicine at regular intervals. Do not take your medicine more often than directed. Do not stop taking except on your doctor's advice. A special MedGuide will be given to you by the pharmacist with each prescription and refill. Be sure to read this information carefully each time. Talk to your pediatrician regarding the use of this medicine in children. While this drug may be prescribed for selected conditions, precautions do apply. Overdosage: If you think you have taken too  much of this medicine contact a poison control center or emergency room at once. NOTE: This medicine is only for you. Do not share this medicine with others. What if I miss a dose? If you miss a dose, take it as soon as you can. If it is almost time for your next dose, take only that dose. Do not take double or extra doses. What may interact with this medicine? -aminoglutethimide -bromocriptine -chemotherapy drugs -female hormones, like estrogens and birth control pills -letrozole -medroxyprogesterone -phenobarbital -rifampin -warfarin This list may not describe all possible interactions. Give your health care provider a list of all the medicines, herbs, non-prescription drugs, or dietary supplements you use. Also tell them if you smoke, drink alcohol, or use illegal drugs. Some items may interact with your medicine. What should I watch for while using this medicine? Visit your doctor or health care professional for regular checks on your progress. You will need regular pelvic exams, breast exams, and mammograms. If you are taking this medicine to reduce your risk of getting breast cancer, you should know that this medicine does not prevent all types of breast cancer. If breast cancer or other problems occur, there is no guarantee that it will be found at an early stage. Do not become pregnant while taking this medicine or for 2 months after stopping this medicine. Stop taking this medicine if you get pregnant or think you are pregnant and contact your doctor. This medicine may harm your unborn baby. Women who can possibly become pregnant should use birth control methods that do not use hormones during tamoxifen treatment and for 2 months after therapy has stopped. Talk with  your health care provider for birth control advice. Do not breast feed while taking this medicine. What side effects may I notice from receiving this medicine? Side effects that you should report to your doctor or health care  professional as soon as possible: -changes in vision (blurred vision) -changes in your menstrual cycle -difficulty breathing or shortness of breath -difficulty walking or talking -Iverna Hammac breast lumps -numbness -pelvic pain or pressure -redness, blistering, peeling or loosening of the skin, including inside the mouth -skin rash or itching (hives) -sudden chest pain -swelling of lips, face, or tongue -swelling, pain or tenderness in your calf or leg -unusual bruising or bleeding -vaginal discharge that is bloody, brown, or rust -weakness -yellowing of the whites of the eyes or skin Side effects that usually do not require medical attention (report to your doctor or health care professional if they continue or are bothersome): -fatigue -hair loss, although uncommon and is usually mild -headache -hot flashes -impotence (in men) -nausea, vomiting (mild) -vaginal discharge (white or clear) This list may not describe all possible side effects. Call your doctor for medical advice about side effects. You may report side effects to FDA at 1-800-FDA-1088. Where should I keep my medicine? Keep out of the reach of children. Store at room temperature between 20 and 25 degrees C (68 and 77 degrees F). Protect from light. Keep container tightly closed. Throw away any unused medicine after the expiration date. NOTE: This sheet is a summary. It may not cover all possible information. If you have questions about this medicine, talk to your doctor, pharmacist, or health care provider.  2015, Elsevier/Gold Standard. (2008-05-25 12:01:56)   Thank you for choosing [place] at [facility] to provide your oncology and hematology care.  To afford each patient quality time with our provider, please arrive at least 15 minutes before your scheduled appointment time.    You need to re-schedule your appointment should you arrive 10 or more minutes late.  We strive to give you quality time  with our providers, and arriving late affects you and other patients whose appointments are after yours.  Also, if you no show three or more times for appointments you may be dismissed from the clinic at the providers discretion.     Again, thank you for choosing [facility].  Our hope is that these requests will decrease the amount of time that you wait before being seen by our physicians.       _____________________________________________________________  Should you have questions after your visit to [facility], please contact our office at [phone] between the hours of 8:30 a.m. and 4:30 p.m.  Voicemails left after 4:30 p.m. will not be returned until the following business day.  For prescription refill requests, have your pharmacy contact our office.

## 2014-12-15 ENCOUNTER — Other Ambulatory Visit: Payer: Self-pay | Admitting: Family Medicine

## 2015-01-08 NOTE — Progress Notes (Signed)
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 [name] is here because of newly diagnosed atypical ductal hyperplasia.  She underwent a R breast lumpectomy with Dr. Brantley Stage on 10/05/2014. She has been underoing diagnositic mammography for ongoing follow-up of a 1.2 x 1.4 x 0.9 cm hypoechoic lobular mass in the R breast at the 2:30 position 6 cm from the nipple.   She reports having frequent mammograms going back to at least 2013. She notes that she could feel the abnormality in the right breast herself. She says after her surgery has been performed she was advised she was "high risk." She understands she does not have breast cancer but the findings on her pathology report showed that she has a higher risk of developing breast cancer.  She gets yearly mammography and colonoscopy. She had her first menstrual cycle in the 8th grade at age 71, She had her first child at the age of 2.  She underwent a TAH/BSO secondary to heavy menses in the 1990's.   MEDICAL HISTORY:  Past Medical History  Diagnosis Date  . Dyslipidemia   . Mild obesity   . Postmenopausal   . DJD (degenerative joint disease)     of neck    . Chronic fatigue   . Diabetes mellitus 2011  . Hypertension 2005  . Wears glasses     SURGICAL HISTORY: Past Surgical History  Procedure Laterality Date  . Left breast biopsy    . Appendectomy    . Lesion removal Right 03/30/2013    Procedure: EXCISION OF NEOPLASM ARM;  Surgeon: Jamesetta So, MD;  Location: [facility];  Service: General;  Laterality: Right;  . Cataract Bilateral   . Cyst excision Right     arm  . Eye surgery Right 06/07/2013    cataract  . Eye surgery Left 09/14/2013    cataract  . Abdominal hysterectomy  1992    fibroids  . Breast lumpectomy with radioactive seed localization Right 10/05/2014    Procedure: BREAST LUMPECTOMY WITH RADIOACTIVE SEED LOCALIZATION;  Surgeon: Erroll Luna, MD;  Location: [facility];  Service: General;  Laterality: Right;    SOCIAL HISTORY: History   Social History  . Marital Status: Married    Spouse Name: N/A  . Number of Children: N/A  . Years of Education: N/A   Occupational History  . Not on file.   Social History Main Topics  . Smoking status: Never Smoker   . Smokeless tobacco: Not on file  . Alcohol Use: No  . Drug Use: No  . Sexual Activity: Yes    Birth Control/ Protection: Surgical   Other Topics Concern  . Not on file   Social History Narrative  The patient states she is "partially married"  Or "separated" she has 2 sons, 3 grandsons and 1 great grandson. She is a never smoker and does not drink ETOH. She has worked in [employer] and in [employer].  FAMILY HISTORY: Family History  Problem Relation Age of Onset  . Heart failure Father   . Hypertension Father   . Arthritis Mother   . Hypertension Sister   . Hypertension Brother   . Diabetes Brother    indicated that her mother is  deceased. She indicated that her father is deceased. She indicated that her sister is alive. She indicated that all of her three brothers are alive. She indicated that both of her sons are alive.  Mother died at 20 from liver cancer, Father died at 38 from CHF.  3 Brothers and 1 sister, 2 brothers have prostate cancer.   ALLERGIES:  is allergic to codeine and propoxyphene n-acetaminophen.  MEDICATIONS:  Current Outpatient Prescriptions  Medication Sig Dispense Refill  . acetaminophen (TYLENOL) 500 MG tablet Take 1,000 mg by mouth every 6 (six) hours as needed. For pain    . aspirin EC 81 MG tablet Take 81 mg by mouth every evening.     . benazepril (LOTENSIN) 10 MG tablet TAKE 1 TABLET BY MOUTH  EVERY DAY 90 tablet 0  . calcium-vitamin D (OSCAL 500/200 D-3) 500-200 MG-UNIT per tablet Take 1 tablet by mouth at bedtime.     . cloNIDine (CATAPRES) 0.1 MG tablet TAKE 1 TABLET BY MOUTH AT BEDTIME 90 tablet 1  . metFORMIN (GLUCOPHAGE-XR) 500 MG 24 hr tablet TAKE 1 TABLET BY MOUTH TWICE DAILY (Patient taking differently: take 2 tablets at night) 180 tablet 1  . venlafaxine XR (EFFEXOR-XR) 37.5 MG 24 hr capsule Take 37.5 mg by mouth daily with breakfast.    . HYDROcodone-acetaminophen (NORCO/VICODIN) 5-325 MG per tablet Take 1 tablet by mouth every 4 (four) hours as needed for moderate pain. (Patient not taking: Reported on 12/13/2014) 20 tablet 0  . oxyCODONE-acetaminophen (ROXICET) 5-325 MG per tablet Take 1 tablet by mouth every 4 (four) hours as needed. (Patient not taking: Reported on 12/13/2014) 30 tablet 0  . pravastatin (PRAVACHOL) 20 MG tablet TAKE 1 TABLET BY MOUTH EVERY EVENING 90 tablet 0  . tamoxifen (NOLVADEX) 20 MG tablet Take 1 tablet (20 mg total) by mouth daily. 30 tablet 3   No current facility-administered medications for this visit.    Review of Systems  Constitutional: Negative for fever, chills, weight loss and malaise/fatigue.  HENT: Negative for congestion, hearing loss, nosebleeds, sore throat and tinnitus.   Eyes: Negative for blurred vision, double vision, pain and discharge.  Respiratory: Negative for cough, hemoptysis, sputum production, shortness of breath and wheezing.   Cardiovascular: Negative for chest pain, palpitations, claudication, leg swelling and PND.  Gastrointestinal: Negative for heartburn, nausea, vomiting, abdominal pain, diarrhea, constipation, blood in stool and melena.  Genitourinary: Negative for dysuria, urgency, frequency and hematuria.  Musculoskeletal: Negative for myalgias, joint pain and falls.  Skin: Negative for itching and rash.  Neurological: Negative for dizziness, tingling, tremors, sensory change, speech change, focal weakness,  seizures, loss of consciousness, weakness and headaches.  Endo/Heme/Allergies: Does not bruise/bleed easily.  Psychiatric/Behavioral: Negative for depression, suicidal ideas, memory loss and substance abuse. The patient is not nervous/anxious and does not have insomnia.     PHYSICAL EXAMINATION: ECOG PERFORMANCE STATUS: 0 - Asymptomatic  Filed Vitals:   12/13/14 1417  BP: 142/63  Pulse: 79  Temp: 97.5 F (36.4 C)  Resp: 18   Filed Weights   12/13/14 1417  Weight: 191 lb 12.8 oz (87 kg)     Physical Exam  Constitutional: She is oriented to person, place, and time and well-developed, well-nourished, and in no distress.  HENT:  Head: Normocephalic and atraumatic.  Nose: Nose normal.  Mouth/Throat: Oropharynx is clear and moist. No oropharyngeal exudate.  Eyes: Conjunctivae and EOM are normal. Pupils are equal, round, and reactive to light. Right eye exhibits no discharge. Left eye exhibits  no discharge. No scleral icterus.  Neck: Normal range of motion. Neck supple. No tracheal deviation present. No thyromegaly present.  Cardiovascular: Normal rate, regular rhythm and normal heart sounds.  Exam reveals no gallop and no friction rub.   No murmur heard. Pulmonary/Chest: Effort normal and breath sounds normal. She has no wheezes. She has no rales.  Abdominal: Soft. Bowel sounds are normal. She exhibits no distension and no mass. There is no tenderness. There is no rebound and no guarding.  Musculoskeletal: Normal range of motion. She exhibits no edema.  Lymphadenopathy:    She has no cervical adenopathy.  Neurological: She is alert and oriented to person, place, and time. She has normal reflexes. No cranial nerve deficit. Gait normal. Coordination normal.  Skin: Skin is warm and dry. No rash noted.  Psychiatric: Mood, memory, affect and judgment normal.  Nursing note and vitals reviewed.    LABORATORY DATA:  I have reviewed the data as listed Lab Results  Component Value Date    WBC 5.0 10/04/2014   HGB 11.4* 10/05/2014   HCT 38.7 10/04/2014   MCV 93.5 10/04/2014   PLT 200 10/04/2014     Chemistry      Component Value Date/Time   NA 141 10/04/2014 1000   K 4.5 10/04/2014 1000   CL 106 10/04/2014 1000   CO2 25 10/04/2014 1000   BUN 12 10/04/2014 1000   CREATININE 1.18* 10/04/2014 1000   CREATININE 0.84 05/26/2014 1315      Component Value Date/Time   CALCIUM 9.7 10/04/2014 1000   ALKPHOS 68 10/04/2014 1000   AST 18 10/04/2014 1000   ALT 14 10/04/2014 1000   BILITOT 0.4 10/04/2014 1000       RADIOGRAPHIC STUDIES: I have personally reviewed the radiological images as listed in HPI and agreed with the findings in the report.  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 Brantley Stage on 10/05/2014 High Risk Breast Disease  We spent time today in discussion of atypical ductal hyperplasia in that it was associated with an increased lifetime risk of breast cancer. We discussed the use of endocrine therapy as chemoprevention, that results in a significant reduction in risk of developing breast cancer. We used her information and went over her risk determination by using the Gail risk assessment model.  We discussed several options for chemoprevention including tamoxifen, raloxifene, and the aromatase inhibitors. Discussed the risks and benefits of each one of these drugs. We discussed the risks of tamoxifen therapy including increased incidence of endometrial cancer, thromboembolic phenomenon, cataracts, and vasomotor symptoms. She has had a hysterectomy and therefore I advised her the major risks associated with tamoxifen are certainly decreased for her.  I explained to her that 5 years of daily tamoxifen or raloxifene reduces the risk of developing a primarily ER+ invasive breast cancer by 40% however there is no data to suggest that it affects overall  mortality. She has a very good understanding of the things which we discussed today. She was provided with reading information on chemoprevention, the risks and benefits.   We discussed different risk reducing strategies for future breast cancer risk. We discussed chemoprevention and lifestyle modification. We discussed role of ongoing surveillance with mammograms versus MRIs.   The patient opted to try Tamoxifen.  A prescription was faxed to her local pharmacy.  We will see her back in one month with labs and physical exam.  Orders Placed This Encounter  Procedures  . CBC with Differential    Standing Status: Future     Number of Occurrences:      Standing Expiration Date: 01/13/2015  . Comprehensive metabolic panel    Standing Status: Future     Number of Occurrences:      Standing Expiration Date: 01/13/2015  . Vitamin D 25 hydroxy    Standing Status: Future     Number of Occurrences:      Standing Expiration Date: 01/13/2015    All questions were answered. The patient knows to call the clinic with any problems, questions or concerns.  This note was electronically signed.    Molli Hazard, MD 01/08/2015 11:27 AM

## 2015-01-09 ENCOUNTER — Encounter (HOSPITAL_COMMUNITY): Payer: Self-pay | Admitting: Hematology & Oncology

## 2015-01-11 ENCOUNTER — Other Ambulatory Visit: Payer: Self-pay | Admitting: Family Medicine

## 2015-01-15 ENCOUNTER — Ambulatory Visit (HOSPITAL_COMMUNITY): Payer: Commercial Managed Care - HMO | Admitting: Hematology & Oncology

## 2015-01-17 ENCOUNTER — Encounter (HOSPITAL_COMMUNITY): Payer: Self-pay | Admitting: Hematology & Oncology

## 2015-01-17 ENCOUNTER — Encounter (HOSPITAL_COMMUNITY): Payer: Commercial Managed Care - HMO | Attending: Hematology & Oncology | Admitting: Hematology & Oncology

## 2015-01-17 VITALS — BP 129/61 | HR 94 | Temp 97.8°F | Resp 16 | Wt 190.6 lb

## 2015-01-17 DIAGNOSIS — Z79810 Long term (current) use of selective estrogen receptor modulators (SERMs): Secondary | ICD-10-CM | POA: Diagnosis not present

## 2015-01-17 DIAGNOSIS — D241 Benign neoplasm of right breast: Secondary | ICD-10-CM

## 2015-01-17 LAB — CBC WITH DIFFERENTIAL/PLATELET
Basophils Absolute: 0 10*3/uL (ref 0.0–0.1)
Basophils Relative: 0 % (ref 0–1)
Eosinophils Absolute: 0 10*3/uL (ref 0.0–0.7)
Eosinophils Relative: 0 % (ref 0–5)
HEMATOCRIT: 35.3 % — AB (ref 36.0–46.0)
Hemoglobin: 11.3 g/dL — ABNORMAL LOW (ref 12.0–15.0)
LYMPHS ABS: 2.5 10*3/uL (ref 0.7–4.0)
LYMPHS PCT: 50 % — AB (ref 12–46)
MCH: 30.4 pg (ref 26.0–34.0)
MCHC: 32 g/dL (ref 30.0–36.0)
MCV: 94.9 fL (ref 78.0–100.0)
MONO ABS: 0.2 10*3/uL (ref 0.1–1.0)
MONOS PCT: 4 % (ref 3–12)
NEUTROS ABS: 2.3 10*3/uL (ref 1.7–7.7)
Neutrophils Relative %: 46 % (ref 43–77)
PLATELETS: 185 10*3/uL (ref 150–400)
RBC: 3.72 MIL/uL — ABNORMAL LOW (ref 3.87–5.11)
RDW: 14.4 % (ref 11.5–15.5)
WBC: 5 10*3/uL (ref 4.0–10.5)

## 2015-01-17 LAB — COMPREHENSIVE METABOLIC PANEL
ALK PHOS: 60 U/L (ref 39–117)
ALT: 14 U/L (ref 0–35)
AST: 20 U/L (ref 0–37)
Albumin: 3.7 g/dL (ref 3.5–5.2)
Anion gap: 6 (ref 5–15)
BUN: 14 mg/dL (ref 6–23)
CHLORIDE: 107 mmol/L (ref 96–112)
CO2: 27 mmol/L (ref 19–32)
Calcium: 8.8 mg/dL (ref 8.4–10.5)
Creatinine, Ser: 0.98 mg/dL (ref 0.50–1.10)
GFR, EST AFRICAN AMERICAN: 65 mL/min — AB (ref >90)
GFR, EST NON AFRICAN AMERICAN: 56 mL/min — AB (ref >90)
GLUCOSE: 143 mg/dL — AB (ref 70–99)
POTASSIUM: 3.5 mmol/L (ref 3.5–5.1)
SODIUM: 140 mmol/L (ref 135–145)
Total Bilirubin: 0.3 mg/dL (ref 0.3–1.2)
Total Protein: 6.8 g/dL (ref 6.0–8.3)

## 2015-01-17 MED ORDER — VENLAFAXINE HCL 37.5 MG PO TABS: 37.5000 mg | ORAL_TABLET | Freq: Two times a day (BID) | ORAL | Status: DC

## 2015-01-17 NOTE — Progress Notes (Signed)
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 female is here for follow-up of atypical ductal hyperplasia.  She underwent a R breast lumpectomy with Dr. Brantley Stage on 10/05/2014. She has been underoing diagnositic mammography for ongoing follow-up of a 1.2 x 1.4 x 0.9 cm hypoechoic lobular mass in the R breast at the 2:30 position 6 cm from the nipple.   She reports having frequent mammograms going back to at least 2013. She notes that she could feel the abnormality in the right breast herself. She says after her surgery has been performed she was advised she was "high risk." She understands she does not have breast cancer but the findings on her pathology report showed that she has a higher risk of developing breast cancer.  She gets yearly mammography and colonoscopy. She had her first menstrual cycle in the 8th grade at age 42, She had her first child at the age of 79.  She underwent a TAH/BSO secondary to heavy menses in the 1990's.   She opted to try Tamoxifen for chemoprevention and is doing well. She does get more frequent hot flashes and is inquiring about a way to alleviate them. She otherwise has no complaints.   MEDICAL HISTORY:  Past Medical History  Diagnosis Date  . Dyslipidemia   . Mild obesity   . Postmenopausal   . DJD (degenerative joint disease)     of neck    . Chronic fatigue   . Diabetes mellitus 2011  . Hypertension 2005  . Wears glasses     SURGICAL HISTORY: Past Surgical History  Procedure Laterality Date  . Left breast biopsy    . Appendectomy    . Lesion removal Right 03/30/2013    Procedure: EXCISION  OF NEOPLASM ARM;  Surgeon: Jamesetta So, MD;  Location: [facility];  Service: General;  Laterality: Right;  . Cataract Bilateral   . Cyst excision Right     arm  . Eye surgery Right 06/07/2013    cataract  . Eye surgery Left 09/14/2013    cataract  . Abdominal hysterectomy  1992    fibroids  . Breast lumpectomy with radioactive seed localization Right 10/05/2014    Procedure: BREAST LUMPECTOMY WITH RADIOACTIVE SEED LOCALIZATION;  Surgeon: Erroll Luna, MD;  Location: [facility];  Service: General;  Laterality: Right;    SOCIAL HISTORY: History   Social History  . Marital Status: Married    Spouse Name: N/A  . Number of Children: N/A  . Years of Education: N/A   Occupational History  . Not on file.   Social History Main Topics  . Smoking status: Never Smoker   . Smokeless tobacco: Not on file  . Alcohol Use: No  . Drug Use: No  . Sexual Activity: Yes    Birth Control/ Protection: Surgical   Other Topics Concern  . Not on file   Social History Narrative  The patient states she is "partially married"  Or "separated" she has 2 sons, 3 grandsons and 1 great grandson. She is a never smoker and does not drink ETOH. She has worked in [employer] and in [employer].  FAMILY HISTORY: Family History  Problem  Relation Age of Onset  . Heart failure Father   . Hypertension Father   . Arthritis Mother   . Hypertension Sister   . Hypertension Brother   . Diabetes Brother    indicated that her mother is deceased. She indicated that her father is deceased. She indicated that her sister is alive. She indicated that all of her three brothers are alive. She indicated that both of her sons are alive.  Mother died at 42 from liver cancer, Father died at 59 from CHF.  3 Brothers and 1 sister, 2 brothers have prostate cancer.   ALLERGIES:  is allergic to codeine and propoxyphene n-acetaminophen.  MEDICATIONS:  Current Outpatient Prescriptions  Medication Sig  Dispense Refill  . acetaminophen (TYLENOL) 500 MG tablet Take 1,000 mg by mouth every 6 (six) hours as needed. For pain    . aspirin EC 81 MG tablet Take 81 mg by mouth every evening.     . benazepril (LOTENSIN) 10 MG tablet TAKE 1 TABLET BY MOUTH EVERY DAY 90 tablet 0  . calcium-vitamin D (OSCAL 500/200 D-3) 500-200 MG-UNIT per tablet Take 1 tablet by mouth at bedtime.     . cloNIDine (CATAPRES) 0.1 MG tablet TAKE 1 TABLET BY MOUTH AT BEDTIME 90 tablet 1  . metFORMIN (GLUCOPHAGE-XR) 500 MG 24 hr tablet TAKE 1 TABLET BY MOUTH TWICE DAILY 180 tablet 1  . pravastatin (PRAVACHOL) 20 MG tablet TAKE 1 TABLET BY MOUTH EVERY EVENING 90 tablet 0  . tamoxifen (NOLVADEX) 20 MG tablet     . venlafaxine XR (EFFEXOR-XR) 37.5 MG 24 hr capsule Take 37.5 mg by mouth daily with breakfast.     No current facility-administered medications for this visit.    Review of Systems  Constitutional: Negative for fever, chills, weight loss and malaise/fatigue.       Hot flashes  HENT: Negative for congestion, hearing loss, nosebleeds, sore throat and tinnitus.   Eyes: Negative for blurred vision, double vision, pain and discharge.  Respiratory: Negative for cough, hemoptysis, sputum production, shortness of breath and wheezing.   Cardiovascular: Negative for chest pain, palpitations, claudication, leg swelling and PND.  Gastrointestinal: Negative for heartburn, nausea, vomiting, abdominal pain, diarrhea, constipation, blood in stool and melena.  Genitourinary: Negative for dysuria, urgency, frequency and hematuria.  Musculoskeletal: Negative for myalgias, joint pain and falls.  Skin: Negative for itching and rash.  Neurological: Negative for dizziness, tingling, tremors, sensory change, speech change, focal weakness, seizures, loss of consciousness, weakness and headaches.  Endo/Heme/Allergies: Does not bruise/bleed easily.  Psychiatric/Behavioral: Negative for depression, suicidal ideas, memory loss and substance  abuse. The patient is not nervous/anxious and does not have insomnia.     PHYSICAL EXAMINATION: ECOG PERFORMANCE STATUS: 0 - Asymptomatic  Filed Vitals:   01/17/15 1346  BP: 129/61  Pulse: 94  Temp: 97.8 F (36.6 C)  Resp: 16   Filed Weights   01/17/15 1346  Weight: 190 lb 9.6 oz (86.456 kg)     Physical Exam  Constitutional: She is oriented to person, place, and time and well-developed, well-nourished, and in no distress.  HENT:  Head: Normocephalic and atraumatic.  Nose: Nose normal.  Mouth/Throat: Oropharynx is clear and moist. No oropharyngeal exudate.  Eyes: Conjunctivae and EOM are normal. Pupils are equal, round, and reactive to light. Right eye exhibits no discharge. Left eye exhibits no discharge. No scleral icterus.  Neck: Normal range of motion. Neck supple. No tracheal deviation present. No thyromegaly present.  Cardiovascular: Normal rate, regular rhythm  and normal heart sounds.  Exam reveals no gallop and no friction rub.   No murmur heard. Pulmonary/Chest: Effort normal and breath sounds normal. She has no wheezes. She has no rales.  Abdominal: Soft. Bowel sounds are normal. She exhibits no distension and no mass. There is no tenderness. There is no rebound and no guarding.  Musculoskeletal: Normal range of motion. She exhibits no edema.  Lymphadenopathy:    She has no cervical adenopathy.  Neurological: She is alert and oriented to person, place, and time. She has normal reflexes. No cranial nerve deficit. Gait normal. Coordination normal.  Skin: Skin is warm and dry. No rash noted.  Psychiatric: Mood, memory, affect and judgment normal.  Nursing note and vitals reviewed.    LABORATORY DATA:  I have reviewed the data as listed Lab Results  Component Value Date   WBC 5.0 10/04/2014   HGB 11.4* 10/05/2014   HCT 38.7 10/04/2014   MCV 93.5 10/04/2014   PLT 200 10/04/2014     Chemistry      Component Value Date/Time   NA 141 10/04/2014 1000   K 4.5  10/04/2014 1000   CL 106 10/04/2014 1000   CO2 25 10/04/2014 1000   BUN 12 10/04/2014 1000   CREATININE 1.18* 10/04/2014 1000   CREATININE 0.84 05/26/2014 1315      Component Value Date/Time   CALCIUM 9.7 10/04/2014 1000   ALKPHOS 68 10/04/2014 1000   AST 18 10/04/2014 1000   ALT 14 10/04/2014 1000   BILITOT 0.4 10/04/2014 1000       RADIOGRAPHIC STUDIES: I have personally reviewed the radiological images as listed in HPI and agreed with the findings in the report.  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 Brantley Stage on 10/05/2014 High Risk Breast Disease Tamoxifen for chemoprevention  She is doing well on tamoxifen. I again reviewed side effects of concern. She has had a hysterectomy. She was given a prescription for Effexor XR to see if it alleviates some of her hot flashes. I have advised her if she cannot tolerate the Effexor for hot flashes do not improve to please notify us. We will plan on seeing her back again in 3-4 months. I again emphasized the importance of ongoing observation with yearly breast exam and ongoing mammography.  All questions were answered. The patient knows to call the clinic with any problems, questions or concerns.  This note was electronically signed.    Molli Hazard, MD 01/17/2015 1:54 PM

## 2015-01-17 NOTE — Patient Instructions (Signed)
..  [place] at [facility] Discharge Instructions  RECOMMENDATIONS MADE BY THE CONSULTANT AND ANY TEST RESULTS WILL BE SENT TO YOUR REFERRING PHYSICIAN.  Exam and discussion with Dr. Whitney Muse. Will call with results of lab work. Return in 3 months for lab work and appointment with Dr. Whitney Muse.  Call for any new symptoms, questions, or concerns.   Thank you for choosing [place] at [facility] to provide your oncology and hematology care.  To afford each patient quality time with our provider, please arrive at least 15 minutes before your scheduled appointment time.    You need to re-schedule your appointment should you arrive 10 or more minutes late.  We strive to give you quality time with our providers, and arriving late affects you and other patients whose appointments are after yours.  Also, if you no show three or more times for appointments you may be dismissed from the clinic at the providers discretion.     Again, thank you for choosing [facility].  Our hope is that these requests will decrease the amount of time that you wait before being seen by our physicians.       _____________________________________________________________  Should you have questions after your visit to [facility], please contact our office at [phone] between the hours of 8:30 a.m. and 4:30 p.m.  Voicemails left after 4:30 p.m. will not be returned until the following business day.  For prescription refill requests, have your pharmacy contact our office.

## 2015-01-18 LAB — VITAMIN D 25 HYDROXY (VIT D DEFICIENCY, FRACTURES): VIT D 25 HYDROXY: 28.1 ng/mL — AB (ref 30.0–100.0)

## 2015-02-01 ENCOUNTER — Ambulatory Visit: Payer: Medicare HMO | Admitting: Family Medicine

## 2015-02-05 ENCOUNTER — Encounter: Payer: Self-pay | Admitting: Family Medicine

## 2015-02-05 ENCOUNTER — Ambulatory Visit (INDEPENDENT_AMBULATORY_CARE_PROVIDER_SITE_OTHER): Payer: Commercial Managed Care - HMO | Admitting: Family Medicine

## 2015-02-05 VITALS — BP 132/80 | HR 84 | Resp 16 | Ht 64.0 in | Wt 192.4 lb

## 2015-02-05 DIAGNOSIS — E785 Hyperlipidemia, unspecified: Secondary | ICD-10-CM

## 2015-02-05 DIAGNOSIS — E1121 Type 2 diabetes mellitus with diabetic nephropathy: Secondary | ICD-10-CM

## 2015-02-05 DIAGNOSIS — I10 Essential (primary) hypertension: Secondary | ICD-10-CM

## 2015-02-05 DIAGNOSIS — E1129 Type 2 diabetes mellitus with other diabetic kidney complication: Secondary | ICD-10-CM | POA: Diagnosis not present

## 2015-02-05 DIAGNOSIS — E669 Obesity, unspecified: Secondary | ICD-10-CM

## 2015-02-05 DIAGNOSIS — N951 Menopausal and female climacteric states: Secondary | ICD-10-CM

## 2015-02-05 LAB — GLUCOSE, POCT (MANUAL RESULT ENTRY): POC GLUCOSE: 123 mg/dL — AB (ref 70–99)

## 2015-02-05 NOTE — Assessment & Plan Note (Signed)
Controlled, no change in medication DASH diet and commitment to daily physical activity for a minimum of 30 minutes discussed and encouraged, as a part of hypertension management. The importance of attaining a healthy weight is also discussed.  BP/Weight 02/05/2015 01/17/2015 12/13/2014 10/05/2014 09/28/2014 07/11/2014 8/45/3646  Systolic BP 803 212 248 250 037 048 889  Diastolic BP 80 61 63 72 76 55 80  Wt. (Lbs) 192.4 190.6 191.8 190 190.04 - 191  BMI 33.01 32.7 32.91 31.62 31.62 - 31.78

## 2015-02-05 NOTE — Assessment & Plan Note (Signed)
Deteriorated. Patient re-educated about  the importance of commitment to a  minimum of 150 minutes of exercise per week.  The importance of healthy food choices with portion control discussed. Encouraged to start a food diary, count calories and to consider  joining a support group. Sample diet sheets offered. Goals set by the patient for the next several months.   Weight /BMI 02/05/2015 01/17/2015 12/13/2014  WEIGHT 192 lb 6.4 oz 190 lb 9.6 oz 191 lb 12.8 oz  HEIGHT 5\' 4"  - 5\' 4"   BMI 33.01 kg/m2 32.7 kg/m2 32.91 kg/m2    Current exercise per week *90** minutes.

## 2015-02-05 NOTE — Assessment & Plan Note (Addendum)
Updated lab needed at/ before next visit. Patient educated about the importance of limiting  Carbohydrate intake , the need to commit to daily physical activity for a minimum of 30 minutes , and to commit weight loss.   Diabetic Labs Latest Ref Rng 01/17/2015 10/04/2014 05/26/2014 11/16/2013 10/27/2013  HbA1c <5.7 % - - 6.5(H) 6.2(H) -  Microalbumin 0.00 - 1.89 mg/dL - - - - 2.23(H)  Micro/Creat Ratio 0.0 - 30.0 mg/g - - - - 4.7  Chol 0 - 200 mg/dL - - 132 136 -  HDL >39 mg/dL - - 49 45 -  Calc LDL 0 - 99 mg/dL - - 63 69 -  Triglycerides <150 mg/dL - - 100 112 -  Creatinine 0.50 - 1.10 mg/dL 0.98 1.18(H) 0.84 1.05 -   BP/Weight 02/05/2015 01/17/2015 12/13/2014 10/05/2014 09/28/2014 07/11/2014 12/23/4740  Systolic BP 595 638 756 433 295 188 416  Diastolic BP 80 61 63 72 76 55 80  Wt. (Lbs) 192.4 190.6 191.8 190 190.04 - 191  BMI 33.01 32.7 32.91 31.62 31.62 - 31.78   Foot/eye exam completion dates Latest Ref Rng 06/01/2014 05/11/2014  Eye Exam No Retinopathy - No Retinopathy  Foot exam Order - - -  Foot Form Completion - Done -

## 2015-02-05 NOTE — Patient Instructions (Addendum)
Annual physical exam in 3.5 month, call if you need me before   Fasting lipid, HBA1C and TSH this week   No changes in medication and I hope you continue to do well  Microalb from office today  Vit D 3 800 IU once daily (OTC)   Walk 30 mins every day

## 2015-02-05 NOTE — Assessment & Plan Note (Signed)
Recently re started on effexor at higher dose by oncology since she has recently been started on tamoxifen for intraduct papilloma Has long h/o hot flashes and was on this drug in the past

## 2015-02-05 NOTE — Assessment & Plan Note (Signed)
Updated lab needed at/ before next visit. Hyperlipidemia:Low fat diet discussed and encouraged.   Lipid Panel  Lab Results  Component Value Date   CHOL 132 05/26/2014   HDL 49 05/26/2014   LDLCALC 63 05/26/2014   TRIG 100 05/26/2014   CHOLHDL 2.7 05/26/2014

## 2015-02-05 NOTE — Progress Notes (Signed)
Subjective:    Patient ID: Jillian [name], female    DOB: July 22, 1941, 74 y.o.   MRN: [id number]  HPI   Jillian [name]     MRN: [id number]      DOB: 06/10/1941   HPI Jillian [name] is here for follow up and re-evaluation of chronic medical conditions, medication management and review of any available recent lab and radiology data.  Preventive health is updated, specifically  Cancer screening and Immunization.   Questions or concerns regarding consultations or procedures which the PT has had in the interim are  addressed. The PT denies any adverse reactions to current medications since the last visit.  There are no new concerns.  There are no specific complaints   ROS Denies recent fever or chills. Denies sinus pressure, nasal congestion, ear pain or sore throat. Denies chest congestion, productive cough or wheezing. Denies chest pains, palpitations and leg swelling Denies abdominal pain, nausea, vomiting,diarrhea or constipation.   Denies dysuria, frequency, hesitancy or incontinence. Denies joint pain, swelling and limitation in mobility. Denies headaches, seizures, numbness, or tingling. Denies depression, anxiety or insomnia. Denies skin break down or rash.   PE  BP 132/80 mmHg  Pulse 84  Resp 16  Ht 5\' 4"  (1.626 m)  Wt 192 lb 6.4 oz (87.272 kg)  BMI 33.01 kg/m2  SpO2 98%  Patient alert and oriented and in no cardiopulmonary distress.  HEENT: No facial asymmetry, EOMI,   oropharynx pink and moist.  Neck supple no JVD, no mass.  Chest: Clear to auscultation bilaterally.  CVS: S1, S2 no murmurs, no S3.Regular rate.  ABD: Soft non tender.   Ext: No edema  MS: Adequate ROM spine, shoulders, hips and knees.  Skin: Intact, no ulcerations or rash noted.  Psych: Good eye contact, normal affect. Memory intact not anxious or depressed appearing.  CNS: CN 2-12 intact, power,  normal throughout.no focal deficits noted.   Assessment & Plan ESSENTIAL HYPERTENSION,  BENIGN Controlled, no change in medication DASH diet and commitment to daily physical activity for a minimum of 30 minutes discussed and encouraged, as a part of hypertension management. The importance of attaining a healthy weight is also discussed.  BP/Weight 02/05/2015 01/17/2015 12/13/2014 10/05/2014 09/28/2014 07/11/2014 05/09/2992  Systolic BP 716 967 893 810 175 102 585  Diastolic BP 80 61 63 72 76 55 80  Wt. (Lbs) 192.4 190.6 191.8 190 190.04 - 191  BMI 33.01 32.7 32.91 31.62 31.62 - 31.78         Controlled diabetes mellitus with nephropathy Updated lab needed at/ before next visit. Patient educated about the importance of limiting  Carbohydrate intake , the need to commit to daily physical activity for a minimum of 30 minutes , and to commit weight loss.   Diabetic Labs Latest Ref Rng 01/17/2015 10/04/2014 05/26/2014 11/16/2013 10/27/2013  HbA1c <5.7 % - - 6.5(H) 6.2(H) -  Microalbumin 0.00 - 1.89 mg/dL - - - - 2.23(H)  Micro/Creat Ratio 0.0 - 30.0 mg/g - - - - 4.7  Chol 0 - 200 mg/dL - - 132 136 -  HDL >39 mg/dL - - 49 45 -  Calc LDL 0 - 99 mg/dL - - 63 69 -  Triglycerides <150 mg/dL - - 100 112 -  Creatinine 0.50 - 1.10 mg/dL 0.98 1.18(H) 0.84 1.05 -   BP/Weight 02/05/2015 01/17/2015 12/13/2014 10/05/2014 09/28/2014 07/11/2014 2/77/8242  Systolic BP 353 614 431 540 086 761 950  Diastolic BP 80 61 63 72 76 55  80  Wt. (Lbs) 192.4 190.6 191.8 190 190.04 - 191  BMI 33.01 32.7 32.91 31.62 31.62 - 31.78   Foot/eye exam completion dates Latest Ref Rng 06/01/2014 05/11/2014  Eye Exam No Retinopathy - No Retinopathy  Foot exam Order - - -  Foot Form Completion - Done -        Hyperlipidemia with target LDL less than 100 Updated lab needed at/ before next visit. Hyperlipidemia:Low fat diet discussed and encouraged.   Lipid Panel  Lab Results  Component Value Date   CHOL 132 05/26/2014   HDL 49 05/26/2014   LDLCALC 63 05/26/2014   TRIG 100 05/26/2014   CHOLHDL 2.7 05/26/2014          Obesity (BMI 30.0-34.9) Deteriorated. Patient re-educated about  the importance of commitment to a  minimum of 150 minutes of exercise per week.  The importance of healthy food choices with portion control discussed. Encouraged to start a food diary, count calories and to consider  joining a support group. Sample diet sheets offered. Goals set by the patient for the next several months.   Weight /BMI 02/05/2015 01/17/2015 12/13/2014  WEIGHT 192 lb 6.4 oz 190 lb 9.6 oz 191 lb 12.8 oz  HEIGHT 5\' 4"  - 5\' 4"   BMI 33.01 kg/m2 32.7 kg/m2 32.91 kg/m2    Current exercise per week *90** minutes.    Hot flashes, menopausal Recently re started on effexor at higher dose by oncology since she has recently been started on tamoxifen for intraduct papilloma Has long h/o hot flashes and was on this drug in the past         Review of Systems     Objective:   Physical Exam        Assessment & Plan:

## 2015-02-06 LAB — MICROALBUMIN / CREATININE URINE RATIO
CREATININE, URINE: 234.9 mg/dL
MICROALB UR: 0.7 mg/dL (ref <2.0)
MICROALB/CREAT RATIO: 3 mg/g (ref 0.0–30.0)

## 2015-02-08 DIAGNOSIS — E1121 Type 2 diabetes mellitus with diabetic nephropathy: Secondary | ICD-10-CM | POA: Diagnosis not present

## 2015-02-08 DIAGNOSIS — I10 Essential (primary) hypertension: Secondary | ICD-10-CM | POA: Diagnosis not present

## 2015-02-08 DIAGNOSIS — E785 Hyperlipidemia, unspecified: Secondary | ICD-10-CM | POA: Diagnosis not present

## 2015-02-08 DIAGNOSIS — E1129 Type 2 diabetes mellitus with other diabetic kidney complication: Secondary | ICD-10-CM | POA: Diagnosis not present

## 2015-02-09 LAB — LIPID PANEL
CHOL/HDL RATIO: 2.4 ratio
Cholesterol: 106 mg/dL (ref 0–200)
HDL: 44 mg/dL — AB (ref >=46)
LDL Cholesterol: 37 mg/dL (ref 0–99)
TRIGLYCERIDES: 126 mg/dL (ref <150)
VLDL: 25 mg/dL (ref 0–40)

## 2015-02-09 LAB — HEMOGLOBIN A1C
Hgb A1c MFr Bld: 6.5 % — ABNORMAL HIGH (ref <5.7)
Mean Plasma Glucose: 140 mg/dL — ABNORMAL HIGH (ref <117)

## 2015-02-09 LAB — TSH: TSH: 0.91 u[IU]/mL (ref 0.350–4.500)

## 2015-02-11 ENCOUNTER — Encounter (HOSPITAL_COMMUNITY): Payer: Self-pay | Admitting: Hematology & Oncology

## 2015-02-16 ENCOUNTER — Other Ambulatory Visit (HOSPITAL_COMMUNITY): Payer: Self-pay

## 2015-02-16 MED ORDER — ERGOCALCIFEROL 1.25 MG (50000 UT) PO CAPS: ORAL_CAPSULE | ORAL | Status: DC

## 2015-03-12 ENCOUNTER — Other Ambulatory Visit: Payer: Self-pay | Admitting: Family Medicine

## 2015-04-11 ENCOUNTER — Other Ambulatory Visit (HOSPITAL_COMMUNITY): Payer: Self-pay | Admitting: Hematology & Oncology

## 2015-04-16 ENCOUNTER — Other Ambulatory Visit (HOSPITAL_COMMUNITY): Payer: Self-pay

## 2015-04-18 ENCOUNTER — Encounter (HOSPITAL_BASED_OUTPATIENT_CLINIC_OR_DEPARTMENT_OTHER): Payer: Commercial Managed Care - HMO

## 2015-04-18 ENCOUNTER — Encounter (HOSPITAL_COMMUNITY): Payer: Commercial Managed Care - HMO | Attending: Hematology & Oncology | Admitting: Hematology & Oncology

## 2015-04-18 ENCOUNTER — Other Ambulatory Visit (HOSPITAL_COMMUNITY): Payer: Self-pay | Admitting: Oncology

## 2015-04-18 ENCOUNTER — Encounter (HOSPITAL_COMMUNITY): Payer: Self-pay | Admitting: Hematology & Oncology

## 2015-04-18 VITALS — BP 134/87 | HR 79 | Temp 97.6°F | Resp 20 | Wt 192.6 lb

## 2015-04-18 DIAGNOSIS — Z139 Encounter for screening, unspecified: Secondary | ICD-10-CM

## 2015-04-18 DIAGNOSIS — D649 Anemia, unspecified: Secondary | ICD-10-CM | POA: Diagnosis not present

## 2015-04-18 DIAGNOSIS — D241 Benign neoplasm of right breast: Secondary | ICD-10-CM | POA: Diagnosis not present

## 2015-04-18 LAB — CBC WITH DIFFERENTIAL/PLATELET
BASOS ABS: 0 10*3/uL (ref 0.0–0.1)
BASOS PCT: 0 % (ref 0–1)
Eosinophils Absolute: 0 10*3/uL (ref 0.0–0.7)
Eosinophils Relative: 1 % (ref 0–5)
HEMATOCRIT: 35.3 % — AB (ref 36.0–46.0)
HEMOGLOBIN: 11.5 g/dL — AB (ref 12.0–15.0)
LYMPHS PCT: 41 % (ref 12–46)
Lymphs Abs: 2.4 10*3/uL (ref 0.7–4.0)
MCH: 30.9 pg (ref 26.0–34.0)
MCHC: 32.6 g/dL (ref 30.0–36.0)
MCV: 94.9 fL (ref 78.0–100.0)
Monocytes Absolute: 0.5 10*3/uL (ref 0.1–1.0)
Monocytes Relative: 8 % (ref 3–12)
Neutro Abs: 2.9 10*3/uL (ref 1.7–7.7)
Neutrophils Relative %: 49 % (ref 43–77)
PLATELETS: 162 10*3/uL (ref 150–400)
RBC: 3.72 MIL/uL — ABNORMAL LOW (ref 3.87–5.11)
RDW: 14.1 % (ref 11.5–15.5)
WBC: 5.8 10*3/uL (ref 4.0–10.5)

## 2015-04-18 LAB — COMPREHENSIVE METABOLIC PANEL
ALBUMIN: 3.6 g/dL (ref 3.5–5.0)
ALT: 16 U/L (ref 14–54)
AST: 20 U/L (ref 15–41)
Alkaline Phosphatase: 51 U/L (ref 38–126)
Anion gap: 7 (ref 5–15)
BUN: 14 mg/dL (ref 6–20)
CALCIUM: 8.7 mg/dL — AB (ref 8.9–10.3)
CO2: 25 mmol/L (ref 22–32)
Chloride: 108 mmol/L (ref 101–111)
Creatinine, Ser: 0.92 mg/dL (ref 0.44–1.00)
GFR calc non Af Amer: >60 mL/min (ref >60)
Glucose, Bld: 135 mg/dL — ABNORMAL HIGH (ref 65–99)
Potassium: 3.9 mmol/L (ref 3.5–5.1)
SODIUM: 140 mmol/L (ref 135–145)
Total Bilirubin: 0.3 mg/dL (ref 0.3–1.2)
Total Protein: 6.5 g/dL (ref 6.5–8.1)

## 2015-04-18 MED ORDER — GABAPENTIN 300 MG PO CAPS: ORAL_CAPSULE | ORAL | Status: DC

## 2015-04-18 MED ORDER — TAMOXIFEN CITRATE 20 MG PO TABS: 20.0000 mg | ORAL_TABLET | Freq: Every day | ORAL | Status: DC

## 2015-04-18 NOTE — Patient Instructions (Signed)
[place] at [facility] Discharge Instructions  RECOMMENDATIONS MADE BY THE CONSULTANT AND ANY TEST RESULTS WILL BE SENT TO YOUR REFERRING PHYSICIAN.  MD appointment in 4 months for follow up. Report any issues/concerns to clinic as needed prior appointment.  Thank you for choosing [place] at [facility] to provide your oncology and hematology care.  To afford each patient quality time with our provider, please arrive at least 15 minutes before your scheduled appointment time.    You need to re-schedule your appointment should you arrive 10 or more minutes late.  We strive to give you quality time with our providers, and arriving late affects you and other patients whose appointments are after yours.  Also, if you no show three or more times for appointments you may be dismissed from the clinic at the providers discretion.     Again, thank you for choosing [facility].  Our hope is that these requests will decrease the amount of time that you wait before being seen by our physicians.       _____________________________________________________________  Should you have questions after your visit to [facility], please contact our office at [phone] between the hours of 8:30 a.m. and 4:30 p.m.  Voicemails left after 4:30 p.m. will not be returned until the following business day.  For prescription refill requests, have your pharmacy contact our office.

## 2015-04-18 NOTE — Progress Notes (Signed)
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 female is here for follow-up of atypical ductal hyperplasia.  She underwent a R breast lumpectomy with Dr. Brantley Stage on 10/05/2014. She has been underoing diagnositic mammography for ongoing follow-up of a 1.2 x 1.4 x 0.9 cm hypoechoic lobular mass in the R breast at the 2:30 position 6 cm from the nipple.   She reports having frequent mammograms going back to at least 2013. She notes that she could feel the abnormality in the right breast herself. She says after her surgery has been performed she was advised she was "high risk." She understands she does not have breast cancer but the findings on her pathology report showed that she has a higher risk of developing breast cancer.  She gets yearly mammography and colonoscopy. She had her first menstrual cycle in the 8th grade at age 27, She had her first child at the age of 9.  She underwent a TAH/BSO secondary to heavy menses in the 1990's.   The patient has complaints of still having heavy hot flashes that only occur at night  and keepi her up all night.  She has been taking Effexor and says that this has not been working.  She takes Tamoxifen and says that this has not affected her hot flashes, she was having them before beginning the medication. She does not feel Tamoxifen has made them worse. She is on Clonidine for her blood pressure.   The patient has no other complaints at this time.  MEDICAL HISTORY:  Past Medical History  Diagnosis Date  . Dyslipidemia   . Mild obesity   . Postmenopausal   . DJD (degenerative joint disease)     of  neck    . Chronic fatigue   . Diabetes mellitus 2011  . Hypertension 2005  . Wears glasses     SURGICAL HISTORY: Past Surgical History  Procedure Laterality Date  . Left breast biopsy    . Appendectomy    . Lesion removal Right 03/30/2013    Procedure: EXCISION OF NEOPLASM ARM;  Surgeon: Jamesetta So, MD;  Location: [facility];  Service: General;  Laterality: Right;  . Cataract Bilateral   . Cyst excision Right     arm  . Eye surgery Right 06/07/2013    cataract  . Eye surgery Left 09/14/2013    cataract  . Abdominal hysterectomy  1992    fibroids  . Breast lumpectomy with radioactive seed localization Right 10/05/2014    Procedure: BREAST LUMPECTOMY WITH RADIOACTIVE SEED LOCALIZATION;  Surgeon: Erroll Luna, MD;  Location: [facility];  Service: General;  Laterality: Right;    SOCIAL HISTORY: Social History   Social History  . Marital Status: Married    Spouse Name: N/A  . Number of Children: N/A  . Years of Education: N/A   Occupational History  . Not on file.   Social History Main Topics  . Smoking status: Never Smoker   . Smokeless tobacco: Not on file  . Alcohol Use: No  . Drug Use: No  . Sexual Activity: Yes    Birth Control/ Protection: Surgical   Other Topics Concern  . Not on  file   Social History Narrative  The patient states she is "partially married"  Or "separated" she has 2 sons, 3 grandsons and 1 great grandson. She is a never smoker and does not drink ETOH. She has worked in [employer] and in [employer].  FAMILY HISTORY: Family History  Problem Relation Age of Onset  . Heart failure Father   . Hypertension Father   . Arthritis Mother   . Hypertension Sister   . Hypertension Brother   . Diabetes Brother    indicated that her mother is deceased. She indicated that her father is deceased. She indicated that her sister is alive. She indicated that all of her three brothers are alive. She indicated that both of her sons are  alive.  Mother died at 5 from liver cancer, Father died at 88 from CHF.  3 Brothers and 1 sister, 2 brothers have prostate cancer.   ALLERGIES:  is allergic to codeine and propoxyphene n-acetaminophen.  MEDICATIONS:  Current Outpatient Prescriptions  Medication Sig Dispense Refill  . aspirin EC 81 MG tablet Take 81 mg by mouth every evening.     . benazepril (LOTENSIN) 10 MG tablet TAKE 1 TABLET BY MOUTH EVERY DAY 90 tablet 0  . calcium-vitamin D (OSCAL 500/200 D-3) 500-200 MG-UNIT per tablet Take 1 tablet by mouth at bedtime.     . cloNIDine (CATAPRES) 0.1 MG tablet TAKE 1 TABLET BY MOUTH AT BEDTIME 90 tablet 1  . ergocalciferol (VITAMIN D2) 50000 UNITS capsule 50,000 units weekly for 4 months then 2000 units daily. 16 capsule 0  . metFORMIN (GLUCOPHAGE-XR) 500 MG 24 hr tablet TAKE 1 TABLET BY MOUTH TWICE DAILY 180 tablet 1  . pravastatin (PRAVACHOL) 20 MG tablet TAKE 1 TABLET BY MOUTH EVERY EVENING 90 tablet 0  . venlafaxine (EFFEXOR) 37.5 MG tablet Take 1 tablet (37.5 mg total) by mouth 2 (two) times daily. 60 tablet 3  . gabapentin (NEURONTIN) 300 MG capsule Start with one tablet at night for 7 days then increase to two tablets at night.  Can go up to 3 tablets at night if needed 90 capsule 2  . tamoxifen (NOLVADEX) 20 MG tablet TAKE 1 TABLET BY MOUTH EVERY DAY 30 tablet 3  . tamoxifen (NOLVADEX) 20 MG tablet Take 1 tablet (20 mg total) by mouth daily. 30 tablet 5   No current facility-administered medications for this visit.    Review of Systems  Constitutional: Negative for fever, chills, weight loss and malaise/fatigue.       Hot flashes  HENT: Negative for congestion, hearing loss, nosebleeds, sore throat and tinnitus.   Eyes: Negative for blurred vision, double vision, pain and discharge.  Respiratory: Negative for cough, hemoptysis, sputum production, shortness of breath and wheezing.   Cardiovascular: Negative for chest pain, palpitations, claudication, leg swelling and PND.    Gastrointestinal: Negative for heartburn, nausea, vomiting, abdominal pain, diarrhea, constipation, blood in stool and melena.  Genitourinary: Negative for dysuria, urgency, frequency and hematuria.  Musculoskeletal: Negative for myalgias, joint pain and falls.  Skin: Negative for itching and rash.  Neurological: Negative for dizziness, tingling, tremors, sensory change, speech change, focal weakness, seizures, loss of consciousness, weakness and headaches.  Endo/Heme/Allergies: Does not bruise/bleed easily.  Psychiatric/Behavioral: Negative for depression, suicidal ideas, memory loss and substance abuse. The patient is not nervous/anxious and does not have insomnia.   14 point review of systems was performed and is negative except as detailed under history of present illness and above  PHYSICAL  EXAMINATION: ECOG PERFORMANCE STATUS: 0 - Asymptomatic  Filed Vitals:   04/18/15 1311  BP: 134/87  Pulse: 79  Temp: 97.6 F (36.4 C)  Resp: 20   Filed Weights   04/18/15 1311  Weight: 192 lb 9.6 oz (87.363 kg)     Physical Exam  Constitutional: She is oriented to person, place, and time and well-developed, well-nourished, and in no distress.  HENT:  Head: Normocephalic and atraumatic.  Nose: Nose normal.  Mouth/Throat: Oropharynx is clear and moist. No oropharyngeal exudate.  Eyes: Conjunctivae and EOM are normal. Pupils are equal, round, and reactive to light. Right eye exhibits no discharge. Left eye exhibits no discharge. No scleral icterus.  Neck: Normal range of motion. Neck supple. No tracheal deviation present. No thyromegaly present.  Cardiovascular: Normal rate, regular rhythm and normal heart sounds.  Exam reveals no gallop and no friction rub.   No murmur heard. Pulmonary/Chest: Effort normal and breath sounds normal. She has no wheezes. She has no rales.  Abdominal: Soft. Bowel sounds are normal. She exhibits no distension and no mass. There is no tenderness. There is no  rebound and no guarding.  Musculoskeletal: Normal range of motion. She exhibits no edema.  Lymphadenopathy:    She has no cervical adenopathy.  Neurological: She is alert and oriented to person, place, and time. She has normal reflexes. No cranial nerve deficit. Gait normal. Coordination normal.  Skin: Skin is warm and dry. No rash noted.  Psychiatric: Mood, memory, affect and judgment normal.  Nursing note and vitals reviewed.    LABORATORY DATA:  I have reviewed the data as listed Lab Results  Component Value Date   WBC 5.8 04/18/2015   HGB 11.5* 04/18/2015   HCT 35.3* 04/18/2015   MCV 94.9 04/18/2015   PLT 162 04/18/2015     Chemistry      Component Value Date/Time   NA 140 04/18/2015 1314   K 3.9 04/18/2015 1314   CL 108 04/18/2015 1314   CO2 25 04/18/2015 1314   BUN 14 04/18/2015 1314   CREATININE 0.92 04/18/2015 1314   CREATININE 0.84 05/26/2014 1315      Component Value Date/Time   CALCIUM 8.7* 04/18/2015 1314   ALKPHOS 51 04/18/2015 1314   AST 20 04/18/2015 1314   ALT 16 04/18/2015 1314   BILITOT 0.3 04/18/2015 1314      RADIOGRAPHIC STUDIES: I have personally reviewed the radiological images as listed in HPI and agreed with the findings in the report.  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 Brantley Stage on 10/05/2014 High Risk Breast Disease Tamoxifen for chemoprevention Mild anemia  She is doing well on tamoxifen. I again reviewed side effects of concern. She has had a hysterectomy. She wishes to continue on chemopreventive Tamoxifen.  We discussed her hot flashes and have opted to discontinue Effexor. I advised her we should try to increase her effexor dose to 150 mg daily but she was not interested in pursuing this.  We will try neurontin which has been studied in hot flashes as well. I have called her in a prescription. She is to notify us with any  difficulties with the medication.  I have ordered her mammogram for October.   We will plan on seeing her back again in 3-4 months. I again emphasized the importance of ongoing observation with yearly breast exam and ongoing mammography.  All questions were answered. The patient  knows to call the clinic with any problems, questions or concerns.   This document serves as a record of services personally performed by Ancil Linsey, MD. It was created on her behalf by Janace Hoard, a trained medical scribe. The creation of this record is based on the scribe's personal observations and the provider's statements to them. This document has been checked and approved by the attending provider.  I have reviewed the above documentation for accuracy and completeness, and I agree with the above.  This note was electronically signed.    Kelby Fam. Whitney Muse, MD

## 2015-04-18 NOTE — Progress Notes (Signed)
LABS DRAWN

## 2015-05-14 DIAGNOSIS — H524 Presbyopia: Secondary | ICD-10-CM | POA: Diagnosis not present

## 2015-05-14 DIAGNOSIS — H5210 Myopia, unspecified eye: Secondary | ICD-10-CM | POA: Diagnosis not present

## 2015-05-14 LAB — HM DIABETES EYE EXAM

## 2015-06-11 ENCOUNTER — Other Ambulatory Visit: Payer: Self-pay | Admitting: Family Medicine

## 2015-06-18 DIAGNOSIS — H04123 Dry eye syndrome of bilateral lacrimal glands: Secondary | ICD-10-CM | POA: Diagnosis not present

## 2015-06-26 ENCOUNTER — Ambulatory Visit (INDEPENDENT_AMBULATORY_CARE_PROVIDER_SITE_OTHER): Payer: Commercial Managed Care - HMO | Admitting: Family Medicine

## 2015-06-26 ENCOUNTER — Encounter: Payer: Self-pay | Admitting: Family Medicine

## 2015-06-26 VITALS — BP 138/82 | HR 83 | Resp 16 | Ht 64.0 in | Wt 195.0 lb

## 2015-06-26 DIAGNOSIS — E785 Hyperlipidemia, unspecified: Secondary | ICD-10-CM

## 2015-06-26 DIAGNOSIS — Z1211 Encounter for screening for malignant neoplasm of colon: Secondary | ICD-10-CM

## 2015-06-26 DIAGNOSIS — Z23 Encounter for immunization: Secondary | ICD-10-CM | POA: Diagnosis not present

## 2015-06-26 DIAGNOSIS — Z0000 Encounter for general adult medical examination without abnormal findings: Secondary | ICD-10-CM | POA: Insufficient documentation

## 2015-06-26 DIAGNOSIS — M170 Bilateral primary osteoarthritis of knee: Secondary | ICD-10-CM

## 2015-06-26 DIAGNOSIS — E1121 Type 2 diabetes mellitus with diabetic nephropathy: Secondary | ICD-10-CM

## 2015-06-26 LAB — POC HEMOCCULT BLD/STL (OFFICE/1-CARD/DIAGNOSTIC): Fecal Occult Blood, POC: NEGATIVE

## 2015-06-26 MED ORDER — IBUPROFEN 600 MG PO TABS: 600.0000 mg | ORAL_TABLET | Freq: Two times a day (BID) | ORAL | Status: DC

## 2015-06-26 MED ORDER — PREDNISONE 5 MG PO TABS: 5.0000 mg | ORAL_TABLET | Freq: Two times a day (BID) | ORAL | Status: DC

## 2015-06-26 NOTE — Assessment & Plan Note (Signed)
Short anti inflammatory course, weight loss, exercise and tylenol daily as needed, Will call for intra articular injections if needed, will refer

## 2015-06-26 NOTE — Progress Notes (Signed)
   Subjective:    Patient ID: Jillian [name], female    DOB: 03-28-1941, 74 y.o.   MRN: [id number]  HPI Patient is in for annual physical exam. C/o bilateral knee stiffness on initial movement, no popping, cracking or instability Recent labs, if available are reviewed. Immunization is reviewed , and  updated if needed.    Review of Systems See HPI     Objective:   Physical Exam BP 138/82 mmHg  Pulse 83  Resp 16  Ht 5\' 4"  (1.626 m)  Wt 195 lb (88.451 kg)  BMI 33.46 kg/m2  SpO2 98%   Pleasant well nourished female, alert and oriented x 3, in no cardio-pulmonary distress. Afebrile. HEENT No facial trauma or asymetry. Sinuses non tender.  Extra occullar muscles intact, pupils equally reactive to light. External ears normal, tympanic membranes clear. Oropharynx moist, no exudate, good dentition. Neck: supple, no adenopathy,JVD or thyromegaly.No bruits.  Chest: Clear to ascultation bilaterally.No crackles or wheezes. Non tender to palpation  Breast: No asymetry,no masses or lumps. No tenderness. No nipple discharge or inversion. No axillary or supraclavicular adenopathy  Cardiovascular system; Heart sounds normal,  S1 and  S2 ,no S3.  No murmur, or thrill. Apical beat not displaced Peripheral pulses normal.  Abdomen: Soft, non tender, no organomegaly or masses. No bruits. Bowel sounds normal. No guarding, tenderness or rebound.  Rectal:  Normal sphincter tone. No mass.No rectal masses.  Guaiac negative stool.  GU: External genitalia normal female genitalia , female distribution of hair. No lesions. Urethral meatus normal in size, no  Prolapse, no lesions visibly  Present. Bladder non tender. Vagina pink and moist , with no visible lesions , discharge present . Adequate pelvic support no  cystocele or rectocele noted  Uterus absent, no adnexal masses,  adnexal tenderness.   Musculoskeletal exam: Full ROM of spine, hips , shoulders and knees. Mild   Deformity and crepitus of right knee, with swelling, otherwise no swelling or crepitus noted. No muscle wasting or atrophy.   Neurologic: Cranial nerves 2 to 12 intact. Power, tone ,sensation and reflexes normal throughout. No disturbance in gait. No tremor.  Skin: Intact, no ulceration, erythema , scaling or rash noted. Pigmentation normal throughout  Psych; Normal mood and affect. Judgement and concentration normal       Assessment & Plan:

## 2015-06-26 NOTE — Assessment & Plan Note (Signed)

## 2015-06-26 NOTE — Patient Instructions (Signed)
F/u in 4,5 month, call if you need me sooner  Flu vaccine and foot exam today  Medication sent in for 5 days for stiffness in knees, commit to tylenol 500 mg one to two daily if needed  HBA1C, chem 7 and EGFR today  Sign for eye exam please  Fasting lipid , cmp and EGFr , HBA1Cin 4.5 month

## 2015-07-09 ENCOUNTER — Other Ambulatory Visit (HOSPITAL_COMMUNITY): Payer: Self-pay | Admitting: Hematology & Oncology

## 2015-07-09 ENCOUNTER — Ambulatory Visit (HOSPITAL_COMMUNITY): Payer: Commercial Managed Care - HMO

## 2015-07-09 DIAGNOSIS — Z9889 Other specified postprocedural states: Secondary | ICD-10-CM

## 2015-07-09 DIAGNOSIS — E1129 Type 2 diabetes mellitus with other diabetic kidney complication: Secondary | ICD-10-CM | POA: Diagnosis not present

## 2015-07-09 DIAGNOSIS — E1121 Type 2 diabetes mellitus with diabetic nephropathy: Secondary | ICD-10-CM | POA: Diagnosis not present

## 2015-07-10 ENCOUNTER — Other Ambulatory Visit (HOSPITAL_COMMUNITY): Payer: Self-pay | Admitting: Hematology & Oncology

## 2015-07-10 ENCOUNTER — Other Ambulatory Visit: Payer: Self-pay | Admitting: Family Medicine

## 2015-07-10 LAB — COMPLETE METABOLIC PANEL WITH GFR
ALBUMIN: 3.7 g/dL (ref 3.6–5.1)
ALK PHOS: 46 U/L (ref 33–130)
ALT: 13 U/L (ref 6–29)
AST: 17 U/L (ref 10–35)
BUN: 11 mg/dL (ref 7–25)
CALCIUM: 8.8 mg/dL (ref 8.6–10.4)
CHLORIDE: 108 mmol/L (ref 98–110)
CO2: 29 mmol/L (ref 20–31)
Creat: 0.97 mg/dL — ABNORMAL HIGH (ref 0.60–0.93)
GFR, EST AFRICAN AMERICAN: 67 mL/min (ref >=60)
GFR, EST NON AFRICAN AMERICAN: 58 mL/min — AB (ref >=60)
Glucose, Bld: 100 mg/dL — ABNORMAL HIGH (ref 65–99)
POTASSIUM: 4.4 mmol/L (ref 3.5–5.3)
SODIUM: 142 mmol/L (ref 135–146)
TOTAL PROTEIN: 6.5 g/dL (ref 6.1–8.1)
Total Bilirubin: 0.4 mg/dL (ref 0.2–1.2)

## 2015-07-10 LAB — HEMOGLOBIN A1C
Hgb A1c MFr Bld: 6.6 % — ABNORMAL HIGH (ref <5.7)
MEAN PLASMA GLUCOSE: 143 mg/dL — AB (ref <117)

## 2015-07-24 ENCOUNTER — Ambulatory Visit (HOSPITAL_COMMUNITY)
Admission: RE | Admit: 2015-07-24 | Discharge: 2015-07-24 | Disposition: A | Discharge status: Home / Self Care | Payer: Commercial Managed Care - HMO | Source: Ambulatory Visit | Attending: Hematology & Oncology | Admitting: Hematology & Oncology

## 2015-07-24 DIAGNOSIS — Z09 Encounter for follow-up examination after completed treatment for conditions other than malignant neoplasm: Secondary | ICD-10-CM | POA: Insufficient documentation

## 2015-07-24 DIAGNOSIS — N6489 Other specified disorders of breast: Secondary | ICD-10-CM | POA: Diagnosis not present

## 2015-07-24 DIAGNOSIS — N63 Unspecified lump in breast: Secondary | ICD-10-CM | POA: Diagnosis not present

## 2015-07-24 DIAGNOSIS — Z9889 Other specified postprocedural states: Secondary | ICD-10-CM

## 2015-08-02 ENCOUNTER — Telehealth: Payer: Self-pay

## 2015-08-02 ENCOUNTER — Other Ambulatory Visit: Payer: Self-pay

## 2015-08-02 MED ORDER — PREDNISONE 5 MG PO TABS: 5.0000 mg | ORAL_TABLET | Freq: Two times a day (BID) | ORAL | Status: DC

## 2015-08-02 NOTE — Telephone Encounter (Signed)
Wants a refill on the prednisone for a flare up in her right knee pain. Has been bothering her a couple days. Wants it send to [facility]

## 2015-08-02 NOTE — Telephone Encounter (Signed)
Pt aware.

## 2015-08-02 NOTE — Telephone Encounter (Signed)
pls send and advise her , 1 refill only pls

## 2015-08-09 ENCOUNTER — Other Ambulatory Visit: Payer: Self-pay | Admitting: Family Medicine

## 2015-08-09 ENCOUNTER — Telehealth: Payer: Self-pay | Admitting: Family Medicine

## 2015-08-09 MED ORDER — IBUPROFEN 800 MG PO TABS: 800.0000 mg | ORAL_TABLET | Freq: Three times a day (TID) | ORAL | Status: DC

## 2015-08-09 MED ORDER — CYCLOBENZAPRINE HCL 5 MG PO TABS
5.0000 mg | ORAL_TABLET | Freq: Three times a day (TID) | ORAL | Status: DC | PRN
Start: 2015-08-09 — End: 2015-10-31

## 2015-08-09 NOTE — Telephone Encounter (Signed)
Patient is stating that she lifted something to heavy a couple of weeks ago an her back is hurting and Tylenol is not helping, please advise?

## 2015-08-09 NOTE — Telephone Encounter (Signed)
pls advise her that i am sending in ibuprofen and a muscle rel;axant  If no improvement will need to go to [facility]

## 2015-08-09 NOTE — Telephone Encounter (Signed)
Attempted to reach patient.   Line is busy.  Will try again.

## 2015-08-09 NOTE — Telephone Encounter (Signed)
Spoke with patient and she is complaining of upper back pain.  Strained lifting.  Please advise.

## 2015-08-10 NOTE — Telephone Encounter (Signed)
Patient aware.

## 2015-08-20 DIAGNOSIS — R109 Unspecified abdominal pain: Secondary | ICD-10-CM | POA: Diagnosis not present

## 2015-08-20 DIAGNOSIS — N390 Urinary tract infection, site not specified: Secondary | ICD-10-CM | POA: Diagnosis not present

## 2015-08-20 DIAGNOSIS — Z719 Counseling, unspecified: Secondary | ICD-10-CM | POA: Diagnosis not present

## 2015-08-21 ENCOUNTER — Ambulatory Visit (HOSPITAL_COMMUNITY): Payer: Commercial Managed Care - HMO | Admitting: Hematology & Oncology

## 2015-08-21 ENCOUNTER — Other Ambulatory Visit (HOSPITAL_COMMUNITY): Payer: Commercial Managed Care - HMO

## 2015-08-27 ENCOUNTER — Other Ambulatory Visit: Payer: Self-pay

## 2015-08-27 ENCOUNTER — Telehealth: Payer: Self-pay

## 2015-08-27 DIAGNOSIS — M25561 Pain in right knee: Secondary | ICD-10-CM

## 2015-08-27 MED ORDER — METFORMIN HCL ER 500 MG PO TB24: 500.0000 mg | ORAL_TABLET | Freq: Two times a day (BID) | ORAL | Status: DC

## 2015-08-27 MED ORDER — PRAVASTATIN SODIUM 20 MG PO TABS: 20.0000 mg | ORAL_TABLET | Freq: Every evening | ORAL | Status: DC

## 2015-08-27 MED ORDER — BENAZEPRIL HCL 10 MG PO TABS: 10.0000 mg | ORAL_TABLET | Freq: Every day | ORAL | Status: DC

## 2015-08-27 MED ORDER — CLONIDINE HCL 0.1 MG PO TABS: 0.1000 mg | ORAL_TABLET | Freq: Every day | ORAL | Status: DC

## 2015-08-27 NOTE — Telephone Encounter (Signed)
Yes, I will sign, I think she has seen him in the past for this

## 2015-08-27 NOTE — Addendum Note (Signed)
Addended by: Denman George B on: 08/27/2015 01:41 PM   Modules accepted: Orders

## 2015-08-27 NOTE — Telephone Encounter (Signed)
Referral entered  

## 2015-08-30 ENCOUNTER — Other Ambulatory Visit (HOSPITAL_COMMUNITY): Payer: Self-pay

## 2015-09-10 ENCOUNTER — Encounter (HOSPITAL_COMMUNITY): Payer: Commercial Managed Care - HMO | Attending: Hematology & Oncology | Admitting: Hematology & Oncology

## 2015-09-10 ENCOUNTER — Other Ambulatory Visit: Payer: Self-pay | Admitting: Family Medicine

## 2015-09-10 ENCOUNTER — Encounter (HOSPITAL_COMMUNITY): Payer: Commercial Managed Care - HMO

## 2015-09-10 VITALS — BP 137/80 | HR 72 | Temp 97.6°F | Resp 18 | Wt 196.0 lb

## 2015-09-10 DIAGNOSIS — D649 Anemia, unspecified: Secondary | ICD-10-CM

## 2015-09-10 DIAGNOSIS — N951 Menopausal and female climacteric states: Secondary | ICD-10-CM | POA: Diagnosis not present

## 2015-09-10 DIAGNOSIS — D241 Benign neoplasm of right breast: Secondary | ICD-10-CM | POA: Diagnosis not present

## 2015-09-10 DIAGNOSIS — N6091 Unspecified benign mammary dysplasia of right breast: Secondary | ICD-10-CM | POA: Diagnosis not present

## 2015-09-10 MED ORDER — TAMOXIFEN CITRATE 20 MG PO TABS: 20.0000 mg | ORAL_TABLET | Freq: Every day | ORAL | Status: DC

## 2015-09-10 NOTE — Patient Instructions (Addendum)
[place] at [facility] Discharge Instructions  RECOMMENDATIONS MADE BY THE CONSULTANT AND ANY TEST RESULTS WILL BE SENT TO YOUR REFERRING PHYSICIAN.   Exam completed by Dr Whitney Muse today Mammogram was good, you need another one in a year Next time we will do a clinical breast exam  Return to see the doctor in 4 months Please call the clinic if you have any questions or concerns   Thank you for choosing [place] at [facility] to provide your oncology and hematology care.  To afford each patient quality time with our provider, please arrive at least 15 minutes before your scheduled appointment time.    You need to re-schedule your appointment should you arrive 10 or more minutes late.  We strive to give you quality time with our providers, and arriving late affects you and other patients whose appointments are after yours.  Also, if you no show three or more times for appointments you may be dismissed from the clinic at the providers discretion.     Again, thank you for choosing [facility].  Our hope is that these requests will decrease the amount of time that you wait before being seen by our physicians.       _____________________________________________________________  Should you have questions after your visit to [facility], please contact our office at [phone] between the hours of 8:30 a.m. and 4:30 p.m.  Voicemails left after 4:30 p.m. will not be returned until the following business day.  For prescription refill requests, have your pharmacy contact our office.

## 2015-09-10 NOTE — Progress Notes (Signed)
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 female is here for follow-up of atypical ductal hyperplasia.  She underwent a R breast lumpectomy with Dr. Brantley Stage on 10/05/2014. She has been underoing diagnositic mammography for ongoing follow-up of a 1.2 x 1.4 x 0.9 cm hypoechoic lobular mass in the R breast at the 2:30 position 6 cm from the nipple.   Mrs. Feider returns to the [facility] today accompanied by her sister.  She has already had her mammogram this year, recently, in November.   She denies having any problems and any pressing concerns. She says there isn't much she's worried about. Mrs. Foyt is no longer taking Effexor or Gabapentin because they were not helpful.  She is not sleeping well at night because of the hot flashes. She says her bed gets wet, and she sleeps with a big towel. Mrs. Bingley states that she's had bad hot flashes all along, and that the Tamoxifen isn't making it any worse that she knows of.  She confirms that she's eating very well.  Her right knee is bothering her. This is unfortunately a chronic problem.   She has already had her flu shot this year.  She otherwise feels at her baseline with no other major concerns.   MEDICAL HISTORY:  Past Medical History  Diagnosis Date  . Dyslipidemia   . Mild obesity   . Postmenopausal   . DJD (degenerative joint disease)     of neck    . Chronic fatigue   . Diabetes mellitus 2011  . Hypertension 2005  . Wears glasses     SURGICAL HISTORY: Past Surgical History  Procedure Laterality Date  . Left breast biopsy    . Appendectomy    . Lesion removal Right 03/30/2013      Procedure: EXCISION OF NEOPLASM ARM;  Surgeon: Jamesetta So, MD;  Location: [facility];  Service: General;  Laterality: Right;  . Cataract Bilateral   . Cyst excision Right     arm  . Eye surgery Right 06/07/2013    cataract  . Eye surgery Left 09/14/2013    cataract  . Abdominal hysterectomy  1992    fibroids  . Breast lumpectomy with radioactive seed localization Right 10/05/2014    Procedure: BREAST LUMPECTOMY WITH RADIOACTIVE SEED LOCALIZATION;  Surgeon: Erroll Luna, MD;  Location: [facility];  Service: General;  Laterality: Right;    SOCIAL HISTORY: Social History   Social History  . Marital Status: Married    Spouse Name: N/A  . Number of Children: N/A  . Years of Education: N/A   Occupational History  . Not on file.   Social History Main Topics  . Smoking status: Never Smoker   . Smokeless tobacco: Not on file  . Alcohol Use: No  . Drug Use: No  . Sexual Activity: Yes    Birth Control/ Protection: Surgical   Other Topics Concern  . Not on file   Social History Narrative  The patient states she is "partially married"  Or "separated" she has 2 sons, 3 grandsons and 1 great grandson. She is a never smoker and does not drink ETOH. She has worked in [employer] and in [employer].  FAMILY HISTORY: Family History  Problem Relation Age of Onset  . Heart failure Father   . Hypertension Father   . Arthritis Mother   . Hypertension Sister   . Hypertension Brother   . Diabetes Brother    indicated that her mother is deceased. She indicated that her father is deceased. She indicated that her sister is alive. She indicated that all of her three brothers are alive. She indicated that both of her sons are alive.  Mother died at 29 from liver cancer, Father died at 19 from CHF.  3 Brothers and 1 sister, 2 brothers have prostate cancer.   ALLERGIES:  is allergic to codeine and propoxyphene n-acetaminophen.  MEDICATIONS:  Current Outpatient  Prescriptions  Medication Sig Dispense Refill  . aspirin EC 81 MG tablet Take 81 mg by mouth every evening.     . benazepril (LOTENSIN) 10 MG tablet Take 1 tablet (10 mg total) by mouth daily. 90 tablet 0  . cholecalciferol (VITAMIN D) 1000 UNITS tablet Take 1,000 Units by mouth daily.    . cloNIDine (CATAPRES) 0.1 MG tablet Take 1 tablet (0.1 mg total) by mouth at bedtime. 90 tablet 0  . cyclobenzaprine (FLEXERIL) 5 MG tablet Take 1 tablet (5 mg total) by mouth 3 (three) times daily as needed for muscle spasms. 30 tablet 0  . metFORMIN (GLUCOPHAGE-XR) 500 MG 24 hr tablet Take 1 tablet (500 mg total) by mouth 2 (two) times daily. 180 tablet 0  . pravastatin (PRAVACHOL) 20 MG tablet Take 1 tablet (20 mg total) by mouth every evening. 90 tablet 0  . tamoxifen (NOLVADEX) 20 MG tablet Take 1 tablet (20 mg total) by mouth daily. 90 tablet 4   No current facility-administered medications for this visit.    Review of Systems  Constitutional: Negative for fever, chills, weight loss and malaise/fatigue.       Hot flashes  HENT: Negative for congestion, hearing loss, nosebleeds, sore throat and tinnitus.   Eyes: Negative for blurred vision, double vision, pain and discharge.  Respiratory: Negative for cough, hemoptysis, sputum production, shortness of breath and wheezing.   Cardiovascular: Negative for chest pain, palpitations, claudication, leg swelling and PND.  Gastrointestinal: Negative for heartburn, nausea, vomiting, abdominal pain, diarrhea, constipation, blood in stool and melena.  Genitourinary: Negative for dysuria, urgency, frequency and hematuria.  Musculoskeletal: Negative for myalgias, joint pain and falls.   Her right knee is bothering her. Skin: Negative for itching and rash.  Neurological: Negative for dizziness, tingling, tremors, sensory change, speech change, focal weakness, seizures, loss of consciousness, weakness and headaches.  Endo/Heme/Allergies: Does not bruise/bleed  easily.  Psychiatric/Behavioral: Negative for depression, suicidal ideas, memory loss and substance abuse. The patient is not nervous/anxious and does not have insomnia.   14 point review of systems was performed and is negative except as detailed under history of present illness and above   PHYSICAL EXAMINATION: ECOG PERFORMANCE STATUS: 0 - Asymptomatic  Filed Vitals:   09/10/15 1400  BP: 137/80  Pulse: 72  Temp: 97.6 F (36.4 C)  Resp: 18   Filed Weights   09/10/15 1400  Weight: 196 lb (88.905 kg)    Physical Exam  Constitutional: She is oriented to person, place, and time and well-developed, well-nourished, and in no distress.  HENT:  Head: Normocephalic and atraumatic.  Nose: Nose normal.  Mouth/Throat: Oropharynx is clear and moist. No oropharyngeal exudate.  Eyes: Conjunctivae and EOM are normal. Pupils are equal, round, and reactive to light. Right eye  exhibits no discharge. Left eye exhibits no discharge. No scleral icterus.  Neck: Normal range of motion. Neck supple. No tracheal deviation present. No thyromegaly present.  Cardiovascular: Normal rate, regular rhythm and normal heart sounds.  Exam reveals no gallop and no friction rub.   No murmur heard. Pulmonary/Chest: Effort normal and breath sounds normal. She has no wheezes. She has no rales.  Abdominal: Soft. Bowel sounds are normal. She exhibits no distension and no mass. There is no tenderness. There is no rebound and no guarding.  Musculoskeletal: Normal range of motion. She exhibits no edema.  Lymphadenopathy:    She has no cervical adenopathy.  Neurological: She is alert and oriented to person, place, and time. She has normal reflexes. No cranial nerve deficit. Gait normal. Coordination normal.  Skin: Skin is warm and dry. No rash noted.  Psychiatric: Mood, memory, affect and judgment normal.  Nursing note and vitals reviewed.   LABORATORY DATA:  I have reviewed the data as listed Lab Results  Component  Value Date   WBC 5.8 04/18/2015   HGB 11.5* 04/18/2015   HCT 35.3* 04/18/2015   MCV 94.9 04/18/2015   PLT 162 04/18/2015     Chemistry      Component Value Date/Time   NA 142 07/09/2015 0912   K 4.4 07/09/2015 0912   CL 108 07/09/2015 0912   CO2 29 07/09/2015 0912   BUN 11 07/09/2015 0912   CREATININE 0.97* 07/09/2015 0912   CREATININE 0.92 04/18/2015 1314      Component Value Date/Time   CALCIUM 8.8 07/09/2015 0912   ALKPHOS 46 07/09/2015 0912   AST 17 07/09/2015 0912   ALT 13 07/09/2015 0912   BILITOT 0.4 07/09/2015 0912      RADIOGRAPHIC STUDIES: I have personally reviewed the radiological images as listed in HPI and agreed with the findings in the report. CLINICAL DATA: Patient underwent right breast lumpectomy in January 2016 which revealed atypical ductal hyperplasia as well as a papilloma. No current complaints.  EXAM: DIGITAL DIAGNOSTIC BILATERAL MAMMOGRAM WITH 3D TOMOSYNTHESIS AND CAD  COMPARISON: Previous exam(s).  ACR Breast Density Category b: There are scattered areas of fibroglandular density.  FINDINGS: Postsurgical scarring lower outer right breast, new from the prior exams. There is an oval circumscribed mass posterior to this with 2 small calcifications, stable for multiple years.  There are no new or suspicious masses, no other areas of architectural distortion and no suspicious calcifications.  Mammographic images were processed with CAD.  IMPRESSION: No evidence of malignancy. Benign postsurgical changes on the right.  RECOMMENDATION: Diagnostic mammography in 1 year. Although this patient did not have DCIS or invasive carcinoma, her lumpectomy did reveal atypical ductal hyperplasia. Diagnostic follow-up is therefore recommended.  I have discussed the findings and recommendations with the patient. Results were also provided in writing at the conclusion of the visit. If applicable, a reminder letter will be sent to the  patient regarding the next appointment.  BI-RADS CATEGORY 2: Benign.   Electronically Signed  By: Lajean Manes M.D.  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 Brantley Stage on 10/05/2014 High Risk Breast Disease Tamoxifen for chemoprevention Mild anemia Hot flashes not improved with Effexor or Gabapentin  She is doing well on tamoxifen. I again reviewed side effects of concern. She has had a hysterectomy. She wishes to continue on chemopreventive Tamoxifen.  We will plan on  seeing her back again in 3-4 months. I again emphasized the importance of ongoing observation with yearly breast exam and ongoing mammography.  She will be due for a repeat breast exam at follow-up.   All questions were answered. The patient knows to call the clinic with any problems, questions or concerns.   This document serves as a record of services personally performed by Ancil Linsey, MD. It was created on her behalf by Toni Amend, a trained medical scribe. The creation of this record is based on the scribe's personal observations and the provider's statements to them. This document has been checked and approved by the attending provider.  I have reviewed the above documentation for accuracy and completeness, and I agree with the above.  This note was electronically signed.    Kelby Fam. Whitney Muse, MD

## 2015-09-11 DIAGNOSIS — E119 Type 2 diabetes mellitus without complications: Secondary | ICD-10-CM | POA: Diagnosis not present

## 2015-09-11 DIAGNOSIS — M25511 Pain in right shoulder: Secondary | ICD-10-CM | POA: Diagnosis not present

## 2015-09-11 DIAGNOSIS — M2391 Unspecified internal derangement of right knee: Secondary | ICD-10-CM | POA: Diagnosis not present

## 2015-10-23 DIAGNOSIS — M2391 Unspecified internal derangement of right knee: Secondary | ICD-10-CM | POA: Diagnosis not present

## 2015-10-24 ENCOUNTER — Other Ambulatory Visit: Payer: Self-pay | Admitting: Family Medicine

## 2015-10-27 ENCOUNTER — Encounter (HOSPITAL_COMMUNITY): Payer: Self-pay | Admitting: Hematology & Oncology

## 2015-10-31 ENCOUNTER — Ambulatory Visit (INDEPENDENT_AMBULATORY_CARE_PROVIDER_SITE_OTHER): Payer: Commercial Managed Care - HMO | Admitting: Family Medicine

## 2015-10-31 ENCOUNTER — Encounter: Payer: Self-pay | Admitting: Family Medicine

## 2015-10-31 VITALS — BP 120/62 | HR 100 | Resp 18 | Ht 64.0 in | Wt 196.0 lb

## 2015-10-31 DIAGNOSIS — E669 Obesity, unspecified: Secondary | ICD-10-CM

## 2015-10-31 DIAGNOSIS — E559 Vitamin D deficiency, unspecified: Secondary | ICD-10-CM

## 2015-10-31 DIAGNOSIS — L84 Corns and callosities: Secondary | ICD-10-CM

## 2015-10-31 DIAGNOSIS — E1121 Type 2 diabetes mellitus with diabetic nephropathy: Secondary | ICD-10-CM

## 2015-10-31 DIAGNOSIS — G8929 Other chronic pain: Secondary | ICD-10-CM

## 2015-10-31 DIAGNOSIS — E785 Hyperlipidemia, unspecified: Secondary | ICD-10-CM

## 2015-10-31 DIAGNOSIS — M25561 Pain in right knee: Secondary | ICD-10-CM | POA: Diagnosis not present

## 2015-10-31 DIAGNOSIS — M25569 Pain in unspecified knee: Secondary | ICD-10-CM

## 2015-10-31 DIAGNOSIS — I10 Essential (primary) hypertension: Secondary | ICD-10-CM

## 2015-10-31 MED ORDER — PREDNISONE 5 MG (21) PO TBPK: 5.0000 mg | ORAL_TABLET | ORAL | Status: DC

## 2015-10-31 NOTE — Patient Instructions (Addendum)
Annual wellness in April, call if you need me sooner  Fasting lipid, cmp and EGFr, hBA1C, CBC and vit D   Prednisone sent for 6 days  Take tramadol one at right for next 7 to 10 days  Please work on good  health habits so that your health will improve. 1. Commitment to daily physical activity for 30 to 60  minutes, if you are able to do this.  2. Commitment to wise food choices. Aim for half of your  food intake to be vegetable and fruit, one quarter starchy foods, and one quarter protein. Try to eat on a regular schedule  3 meals per day, snacking between meals should be limited to vegetables or fruits or small portions of nuts. 64 ounces of water per day is generally recommended, unless you have specific health conditions, like heart failure or kidney failure where you will need to limit fluid intake.  3. Commitment to sufficient and a  good quality of physical and mental rest daily, generally between 6 to 8 hours per day.  WITH PERSISTANCE AND PERSEVERANCE, THE IMPOSSIBLE , BECOMES THE NORM!  Callus, do not dig, just [employer]

## 2015-10-31 NOTE — Progress Notes (Signed)
Subjective:    Patient ID: Jillian [name], female    DOB: [birth year], 75 y.o.   MRN: [id number]  HPI    Jillian [name]     MRN: [id number]      DOB: 11/10/1940   HPI Jillian [name] is here for follow up and re-evaluation of chronic medical conditions, medication management and review of any available recent lab and radiology data.  Preventive health is updated, specifically  Cancer screening and Immunization.   Questions or concerns regarding consultations or procedures which the Jillian [name] has had in the interim are  Addressed.Being managed by rheumatology for right  knee pain, not taking tramadol as prescribed still having more than desired pain, at times seems unstable, no falls The Jillian [name] denies any adverse reactions to current medications since the last visit.  C/o callus under foot , not painful ROS Denies recent fever or chills. Denies sinus pressure, nasal congestion, ear pain or sore throat. Denies chest congestion, productive cough or wheezing. Denies chest pains, palpitations and leg swelling Denies abdominal pain, nausea, vomiting,diarrhea or constipation.   Denies dysuria, frequency, hesitancy or incontinence. Denies headaches, seizures, numbness, or tingling. Denies depression, anxiety or insomnia. Denies skin break down or rash.   PE  BP 120/62 mmHg  Pulse 100  Resp 18  Ht 5\' 4"  (1.626 m)  Wt 196 lb (88.905 kg)  BMI 33.63 kg/m2  SpO2 98%  Patient alert and oriented and in no cardiopulmonary distress.  HEENT: No facial asymmetry, EOMI,   oropharynx pink and moist.  Neck supple no JVD, no mass.  Chest: Clear to auscultation bilaterally.  CVS: S1, S2 no murmurs, no S3.Regular rate.  ABD: Soft non tender.   Ext: No edema  MS: Adequate ROM spine, shoulders, hips an reduced in right knee.  Skin: Intact, no ulcerations or rash noted.callus on sole of left foot, no warmth or drainage  Psych: Good eye contact, normal affect. Memory intact not anxious or depressed  appearing.  CNS: CN 2-12 intact, power,  normal throughout.no focal deficits noted.   Assessment & Plan   ESSENTIAL HYPERTENSION, BENIGN Controlled, no change in medication DASH diet and commitment to daily physical activity for a minimum of 30 minutes discussed and encouraged, as a part of hypertension management. The importance of attaining a healthy weight is also discussed.  BP/Weight 10/31/2015 09/10/2015 06/26/2015 04/18/2015 02/05/2015 01/17/2015 [id number]  Systolic BP 123456 0000000 0000000 Q000111Q Q000111Q Q000111Q [id number]  Diastolic BP 62 80 82 87 80 61 63  Wt. (Lbs) 196 196 195 192.6 192.4 190.6 191.8  BMI 33.63 33.63 33.46 33.04 33.01 32.7 32.91        Controlled diabetes mellitus with nephropathy Controlled, no change in medication Jillian [name] is reminded of the importance of commitment to daily physical activity for 30 minutes or more, as able and the need to limit carbohydrate intake to 30 to 60 grams per meal to help with blood sugar control.   The need to take medication as prescribed, test blood sugar as directed, and to call between visits if there is a concern that blood sugar is uncontrolled is also discussed.   Jillian [name] is reminded of the importance of daily foot exam, annual eye examination, and good blood sugar, blood pressure and cholesterol control. Updated lab needed at/ before next visit.   Diabetic Labs Latest Ref Rng 07/09/2015 04/18/2015 02/08/2015 02/05/2015 01/17/2015  HbA1c <5.7 % 6.6(H) - 6.5(H) - -  Microalbumin <2.0 mg/dL - - - 0.7 -  Micro/Creat Ratio 0.0 - 30.0 mg/g - - - 3.0 -  Chol 0 - 200 mg/dL - - 106 - -  HDL >=46 mg/dL - - 44(L) - -  Calc LDL 0 - 99 mg/dL - - 37 - -  Triglycerides <150 mg/dL - - 126 - -  Creatinine 0.60 - 0.93 mg/dL 0.97(H) 0.92 - - 0.98   BP/Weight 10/31/2015 09/10/2015 06/26/2015 04/18/2015 02/05/2015 01/17/2015 [id number]  Systolic BP 123456 0000000 0000000 Q000111Q Q000111Q Q000111Q [id number]  Diastolic BP 62 80 82 87 80 61 63  Wt. (Lbs) 196 196 195 192.6 192.4 190.6 191.8    BMI 33.63 33.63 33.46 33.04 33.01 32.7 32.91   Foot/eye exam completion dates Latest Ref Rng 06/26/2015 05/14/2015  Eye Exam No Retinopathy - Retinopathy(A)  Foot exam Order - - -  Foot Form Completion - Done -         Chronic knee pain Flare despite recent intra articular injection by rheumatology. Shortcourse of oral prednisone, weight loss and advised to take  Tramadol as prescribed by rheumatology Joint is unstable and MRI is planned by rheumatology if problem persists  Hyperlipidemia with target LDL less than 100 Hyperlipidemia:Low fat diet discussed and encouraged.   Lipid Panel  Lab Results  Component Value Date   CHOL 106 02/08/2015   HDL 44* 02/08/2015   LDLCALC 37 02/08/2015   TRIG 126 02/08/2015   CHOLHDL 2.4 02/08/2015   Updated lab past due    Obesity (BMI 30.0-34.9) Unchnaged. Patient re-educated about  the importance of commitment to a  minimum of 150 minutes of exercise per week.  The importance of healthy food choices with portion control discussed. Encouraged to start a food diary, count calories and to consider  joining a support group. Sample diet sheets offered. Goals set by the patient for the next several months.   Weight /BMI 10/31/2015 09/10/2015 06/26/2015  WEIGHT 196 lb 196 lb 195 lb  HEIGHT 5\' 4"  - 5\' 4"   BMI 33.63 kg/m2 33.63 kg/m2 33.46 kg/m2    Current exercise per week 90 minutes.   Callus of foot educated re need to avoid excessive trauma which may lead to infection. Encouraged to see podiatry, she goes to the nail store and is saisfied      Review of Systems     Objective:   Physical Exam        Assessment & Plan:

## 2015-11-05 DIAGNOSIS — L84 Corns and callosities: Secondary | ICD-10-CM | POA: Insufficient documentation

## 2015-11-05 NOTE — Assessment & Plan Note (Addendum)
Flare despite recent intra articular injection by rheumatology. Shortcourse of oral prednisone, weight loss and advised to take  Tramadol as prescribed by rheumatology Joint is unstable and MRI is planned by rheumatology if problem persists

## 2015-11-05 NOTE — Assessment & Plan Note (Signed)
Controlled, no change in medication Jillian [name] is reminded of the importance of commitment to daily physical activity for 30 minutes or more, as able and the need to limit carbohydrate intake to 30 to 60 grams per meal to help with blood sugar control.   The need to take medication as prescribed, test blood sugar as directed, and to call between visits if there is a concern that blood sugar is uncontrolled is also discussed.   Jillian [name] is reminded of the importance of daily foot exam, annual eye examination, and good blood sugar, blood pressure and cholesterol control. Updated lab needed at/ before next visit.   Diabetic Labs Latest Ref Rng 07/09/2015 04/18/2015 02/08/2015 02/05/2015 01/17/2015  HbA1c <5.7 % 6.6(H) - 6.5(H) - -  Microalbumin <2.0 mg/dL - - - 0.7 -  Micro/Creat Ratio 0.0 - 30.0 mg/g - - - 3.0 -  Chol 0 - 200 mg/dL - - 106 - -  HDL >=46 mg/dL - - 44(L) - -  Calc LDL 0 - 99 mg/dL - - 37 - -  Triglycerides <150 mg/dL - - 126 - -  Creatinine 0.60 - 0.93 mg/dL 0.97(H) 0.92 - - 0.98   BP/Weight 10/31/2015 09/10/2015 06/26/2015 04/18/2015 02/05/2015 01/17/2015 [id number]  Systolic BP 123456 0000000 0000000 Q000111Q Q000111Q Q000111Q [id number]  Diastolic BP 62 80 82 87 80 61 63  Wt. (Lbs) 196 196 195 192.6 192.4 190.6 191.8  BMI 33.63 33.63 33.46 33.04 33.01 32.7 32.91   Foot/eye exam completion dates Latest Ref Rng 06/26/2015 05/14/2015  Eye Exam No Retinopathy - Retinopathy(A)  Foot exam Order - - -  Foot Form Completion - Done -

## 2015-11-05 NOTE — Assessment & Plan Note (Signed)
educated re need to avoid excessive trauma which may lead to infection. Encouraged to see podiatry, she goes to the nail store and is saisfied

## 2015-11-05 NOTE — Assessment & Plan Note (Signed)
Controlled, no change in medication DASH diet and commitment to daily physical activity for a minimum of 30 minutes discussed and encouraged, as a part of hypertension management. The importance of attaining a healthy weight is also discussed.  BP/Weight 10/31/2015 09/10/2015 06/26/2015 04/18/2015 02/05/2015 01/17/2015 [id number]  Systolic BP 123456 0000000 0000000 Q000111Q Q000111Q Q000111Q [id number]  Diastolic BP 62 80 82 87 80 61 63  Wt. (Lbs) 196 196 195 192.6 192.4 190.6 191.8  BMI 33.63 33.63 33.46 33.04 33.01 32.7 32.91

## 2015-11-05 NOTE — Assessment & Plan Note (Signed)
Hyperlipidemia:Low fat diet discussed and encouraged.   Lipid Panel  Lab Results  Component Value Date   CHOL 106 02/08/2015   HDL 44* 02/08/2015   LDLCALC 37 02/08/2015   TRIG 126 02/08/2015   CHOLHDL 2.4 02/08/2015   Updated lab past due

## 2015-11-05 NOTE — Assessment & Plan Note (Signed)
Unchnaged. Patient re-educated about  the importance of commitment to a  minimum of 150 minutes of exercise per week.  The importance of healthy food choices with portion control discussed. Encouraged to start a food diary, count calories and to consider  joining a support group. Sample diet sheets offered. Goals set by the patient for the next several months.   Weight /BMI 10/31/2015 09/10/2015 06/26/2015  WEIGHT 196 lb 196 lb 195 lb  HEIGHT 5\' 4"  - 5\' 4"   BMI 33.63 kg/m2 33.63 kg/m2 33.46 kg/m2    Current exercise per week 90 minutes.

## 2015-11-07 DIAGNOSIS — R110 Nausea: Secondary | ICD-10-CM | POA: Diagnosis not present

## 2015-11-27 DIAGNOSIS — M2391 Unspecified internal derangement of right knee: Secondary | ICD-10-CM | POA: Diagnosis not present

## 2015-12-05 ENCOUNTER — Telehealth: Payer: Self-pay | Admitting: Family Medicine

## 2015-12-05 DIAGNOSIS — S83206A Unspecified tear of unspecified meniscus, current injury, right knee, initial encounter: Secondary | ICD-10-CM | POA: Diagnosis not present

## 2015-12-05 NOTE — Telephone Encounter (Signed)
Opened in Error.

## 2015-12-24 DIAGNOSIS — E1129 Type 2 diabetes mellitus with other diabetic kidney complication: Secondary | ICD-10-CM | POA: Diagnosis not present

## 2015-12-24 DIAGNOSIS — E559 Vitamin D deficiency, unspecified: Secondary | ICD-10-CM | POA: Diagnosis not present

## 2015-12-24 DIAGNOSIS — I10 Essential (primary) hypertension: Secondary | ICD-10-CM | POA: Diagnosis not present

## 2015-12-24 DIAGNOSIS — E785 Hyperlipidemia, unspecified: Secondary | ICD-10-CM | POA: Diagnosis not present

## 2015-12-24 DIAGNOSIS — E1121 Type 2 diabetes mellitus with diabetic nephropathy: Secondary | ICD-10-CM | POA: Diagnosis not present

## 2015-12-24 LAB — CBC
HEMATOCRIT: 37 % (ref 35.0–45.0)
Hemoglobin: 12.1 g/dL (ref 11.7–15.5)
MCH: 30.7 pg (ref 27.0–33.0)
MCHC: 32.7 g/dL (ref 32.0–36.0)
MCV: 93.9 fL (ref 80.0–100.0)
MPV: 11.3 fL (ref 7.5–12.5)
Platelets: 199 10*3/uL (ref 140–400)
RBC: 3.94 MIL/uL (ref 3.80–5.10)
RDW: 14.5 % (ref 11.0–15.0)
WBC: 6.6 10*3/uL (ref 3.8–10.8)

## 2015-12-24 LAB — HEMOGLOBIN A1C
HEMOGLOBIN A1C: 6.5 % — AB (ref <5.7)
Mean Plasma Glucose: 140 mg/dL

## 2015-12-25 LAB — COMPLETE METABOLIC PANEL WITH GFR
ALK PHOS: 51 U/L (ref 33–130)
ALT: 12 U/L (ref 6–29)
AST: 16 U/L (ref 10–35)
Albumin: 3.9 g/dL (ref 3.6–5.1)
BUN: 11 mg/dL (ref 7–25)
CALCIUM: 9.2 mg/dL (ref 8.6–10.4)
CHLORIDE: 108 mmol/L (ref 98–110)
CO2: 25 mmol/L (ref 20–31)
CREATININE: 0.89 mg/dL (ref 0.60–0.93)
GFR, EST AFRICAN AMERICAN: 74 mL/min (ref >=60)
GFR, Est Non African American: 65 mL/min (ref >=60)
Glucose, Bld: 103 mg/dL — ABNORMAL HIGH (ref 65–99)
POTASSIUM: 4.1 mmol/L (ref 3.5–5.3)
Sodium: 142 mmol/L (ref 135–146)
Total Bilirubin: 0.3 mg/dL (ref 0.2–1.2)
Total Protein: 6.4 g/dL (ref 6.1–8.1)

## 2015-12-25 LAB — VITAMIN D 25 HYDROXY (VIT D DEFICIENCY, FRACTURES): VIT D 25 HYDROXY: 32 ng/mL (ref 30–100)

## 2015-12-25 LAB — LIPID PANEL
CHOL/HDL RATIO: 1.9 ratio (ref <=5.0)
Cholesterol: 103 mg/dL — ABNORMAL LOW (ref 125–200)
HDL: 54 mg/dL (ref >=46)
LDL CALC: 33 mg/dL (ref <130)
Triglycerides: 82 mg/dL (ref <150)
VLDL: 16 mg/dL (ref <30)

## 2015-12-26 ENCOUNTER — Encounter: Payer: Self-pay | Admitting: Family Medicine

## 2015-12-26 ENCOUNTER — Ambulatory Visit (INDEPENDENT_AMBULATORY_CARE_PROVIDER_SITE_OTHER): Payer: Commercial Managed Care - HMO | Admitting: Family Medicine

## 2015-12-26 VITALS — BP 130/82 | HR 87 | Resp 16 | Ht 64.0 in | Wt 195.1 lb

## 2015-12-26 DIAGNOSIS — D241 Benign neoplasm of right breast: Secondary | ICD-10-CM | POA: Diagnosis not present

## 2015-12-26 DIAGNOSIS — E785 Hyperlipidemia, unspecified: Secondary | ICD-10-CM | POA: Diagnosis not present

## 2015-12-26 DIAGNOSIS — M170 Bilateral primary osteoarthritis of knee: Secondary | ICD-10-CM

## 2015-12-26 DIAGNOSIS — Z0000 Encounter for general adult medical examination without abnormal findings: Secondary | ICD-10-CM | POA: Insufficient documentation

## 2015-12-26 MED ORDER — KETOROLAC TROMETHAMINE 60 MG/2ML IM SOLN
60.0000 mg | Freq: Once | INTRAMUSCULAR | Status: AC
Start: 2015-12-26 — End: 2015-12-26
  Administered 2015-12-26: 60 mg via INTRAMUSCULAR

## 2015-12-26 MED ORDER — PRAVASTATIN SODIUM 10 MG PO TABS: 10.0000 mg | ORAL_TABLET | Freq: Every day | ORAL | Status: DC

## 2015-12-26 MED ORDER — IBUPROFEN 800 MG PO TABS: 800.0000 mg | ORAL_TABLET | Freq: Three times a day (TID) | ORAL | Status: DC | PRN

## 2015-12-26 MED ORDER — RANITIDINE HCL 300 MG PO CAPS: 300.0000 mg | ORAL_CAPSULE | Freq: Every evening | ORAL | Status: DC

## 2015-12-26 MED ORDER — METHYLPREDNISOLONE ACETATE 80 MG/ML IJ SUSP
80.0000 mg | Freq: Once | INTRAMUSCULAR | Status: AC
  Administered 2015-12-26: 80 mg via INTRAMUSCULAR

## 2015-12-26 MED ORDER — PREDNISONE 5 MG (21) PO TBPK: 5.0000 mg | ORAL_TABLET | ORAL | Status: DC

## 2015-12-26 NOTE — Patient Instructions (Signed)
F/u end August , call if you need me sooner  REDUCE PRAVACHOL dose to 10 mg daily  Injections and medication sent for right knee pain and swelling  Call in 2 weeks if no better, you will need imaging and to see orhto as discussed  Bring living will to next visit  Thank you  for choosing [facility]. We consider it a privelige to serve you.  Delivering excellent health care in a caring and  compassionate way is our goal.  Partnering with you,  so that together we can achieve this goal is our strategy.  Keep light on so NO fALLS     Fall Prevention in the Home  Falls can cause injuries. They can happen to people of all ages. There are many things you can do to make your home safe and to help prevent falls.  WHAT CAN I DO ON THE OUTSIDE OF MY HOME?  Regularly fix the edges of walkways and driveways and fix any cracks.  Remove anything that might make you trip as you walk through a door, such as a raised step or threshold.  Trim any bushes or trees on the path to your home.  Use bright outdoor lighting.  Clear any walking paths of anything that might make someone trip, such as rocks or tools.  Regularly check to see if handrails are loose or broken. Make sure that both sides of any steps have handrails.  Any raised decks and porches should have guardrails on the edges.  Have any leaves, snow, or ice cleared regularly.  Use sand or salt on walking paths during winter.  Clean up any spills in your garage right away. This includes oil or grease spills. WHAT CAN I DO IN THE BATHROOM?   Use night lights.  Install grab bars by the toilet and in the tub and shower. Do not use towel bars as grab bars.  Use non-skid mats or decals in the tub or shower.  If you need to sit down in the shower, use a plastic, non-slip stool.  Keep the floor dry. Clean up any water that spills on the floor as soon as it happens.  Remove soap buildup in the tub or shower  regularly.  Attach bath mats securely with double-sided non-slip rug tape.  Do not have throw rugs and other things on the floor that can make you trip. WHAT CAN I DO IN THE BEDROOM?  Use night lights.  Make sure that you have a light by your bed that is easy to reach.  Do not use any sheets or blankets that are too big for your bed. They should not hang down onto the floor.  Have a firm chair that has side arms. You can use this for support while you get dressed.  Do not have throw rugs and other things on the floor that can make you trip. WHAT CAN I DO IN THE KITCHEN?  Clean up any spills right away.  Avoid walking on wet floors.  Keep items that you use a lot in easy-to-reach places.  If you need to reach something above you, use a strong step stool that has a grab bar.  Keep electrical cords out of the way.  Do not use floor polish or wax that makes floors slippery. If you must use wax, use non-skid floor wax.  Do not have throw rugs and other things on the floor that can make you trip. WHAT CAN I DO WITH MY STAIRS?  Do not leave any items on the stairs.  Make sure that there are handrails on both sides of the stairs and use them. Fix handrails that are broken or loose. Make sure that handrails are as long as the stairways.  Check any carpeting to make sure that it is firmly attached to the stairs. Fix any carpet that is loose or worn.  Avoid having throw rugs at the top or bottom of the stairs. If you do have throw rugs, attach them to the floor with carpet tape.  Make sure that you have a light switch at the top of the stairs and the bottom of the stairs. If you do not have them, ask someone to add them for you. WHAT ELSE CAN I DO TO HELP PREVENT FALLS?  Wear shoes that:  Do not have high heels.  Have rubber bottoms.  Are comfortable and fit you well.  Are closed at the toe. Do not wear sandals.  If you use a stepladder:  Make sure that it is fully  opened. Do not climb a closed stepladder.  Make sure that both sides of the stepladder are locked into place.  Ask someone to hold it for you, if possible.  Clearly mark and make sure that you can see:  Any grab bars or handrails.  First and last steps.  Where the edge of each step is.  Use tools that help you move around (mobility aids) if they are needed. These include:  Canes.  Walkers.  Scooters.  Crutches.  Turn on the lights when you go into a dark area. Replace any light bulbs as soon as they burn out.  Set up your furniture so you have a clear path. Avoid moving your furniture around.  If any of your floors are uneven, fix them.  If there are any pets around you, be aware of where they are.  Review your medicines with your doctor. Some medicines can make you feel dizzy. This can increase your chance of falling. Ask your doctor what other things that you can do to help prevent falls.   This information is not intended to replace advice given to you by your health care provider. Make sure you discuss any questions you have with your health care provider.   Document Released: 07/05/2009 Document Revised: 01/23/2015 Document Reviewed: 10/13/2014 Elsevier Interactive Patient Education [employer].

## 2015-12-26 NOTE — Progress Notes (Signed)
Subjective:    Patient ID: Jillian [name], female    DOB: 1940-12-27, 75 y.o.   MRN: [id number]  HPI Preventive Screening-Counseling & Management   Patient present here today for a Medicare annual wellness visit. Also c/o increased right knee pain and swelling despite 2 prior intra articular injections in past 6 months, likely needs surgical intervention.   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 No  Over the past two weeks, have you felt little interest or pleasure in doing things? No  Have you lost interest or pleasure in daily life? No  Do you often feel hopeless? No  Do you cry easily over simple problems? No   Activities of Daily Living  In your present state of health, do you have any difficulty performing the following activities?  Driving?: No Managing money?: No Feeding yourself?:No Getting from bed to chair?:No Climbing a flight of stairs?:has to take her time when knee is bothering her  Preparing food and eating?:No Bathing or showering?:No Getting dressed?:No Getting to the toilet?:No Using the toilet?:No Moving around from place to place?: yes right knee pain and swelling intermittently  Fall Risk Assessment In the past year have you fallen or had a near fall?:yes twice, fell in the dark Are you currently taking any medications that make you dizzy?:No   Hearing  Difficulties: No Do you often ask people to speak up or repeat themselves?:No Do you experience ringing or noises in your ears?:No Do you have difficulty understanding soft or whispered voices?:No  Cognitive Testing  Alert? Yes Normal Appearance?Yes  Oriented to person? Yes Place? Yes  Time? Yes  Displays appropriate judgment?Yes  Can read the correct time from a watch face? yes Are you having problems remembering things?No  Advanced Directives have been discussed with the patient?Yes, already has brochure. Has been working on it , full code,  List the Names of Other Physician/Practitioners you currently use:  Dr Whitney Muse (cardio)  Dr Charlestine Night (rheumotology)  Indicate any recent Medical Services you may have received from other than [facility] providers in the past year (date may be approximate).   Assessment:    Annual Wellness Exam   Plan:      Medicare Attestation  I have personally reviewed:  The patient's medical and social history  Their use of alcohol, tobacco or illicit drugs  Their current medications and supplements  The patient's functional ability including ADLs,fall risks, home safety risks, cognitive, and hearing and visual impairment  Diet and physical activities  Evidence for depression or mood disorders  The patient's weight, height, BMI, and visual acuity have been recorded in the chart. I have made referrals, counseling, and provided education to the patient based on review of the above and I have provided the patient with a written personalized care plan for preventive services.      Review of Systems  Objective:   Physical Exam BP 130/82 mmHg  Pulse 87  Resp 16  Ht 5\' 4"  (1.626 m)  Wt 195 lb 1.9 oz (88.506 kg)  BMI 33.48 kg/m2  SpO2 96%   Ms: decreased ROM with swelling, deformity and crepitus of right knee greater than left , but affecting both knees     Assessment & Plan:  Medicare annual wellness visit, subsequent Annual exam as  documented. Counseling done  re healthy lifestyle involving commitment to 150 minutes exercise per week, heart healthy diet, and attaining healthy weight.The importance of adequate sleep also discussed. Regular seat belt use and home safety, is also discussed. Changes in health habits are decided on by the patient with goals and time frames  set for achieving them. Immunization and cancer screening needs are specifically addressed at this visit.   Primary osteoarthritis of both knees Uncontrolled.Toradol and depo medrol administered IM in the office , to be followed by a short course of oral prednisone and NSAIDS.   Hyperlipidemia with target LDL less than 100 Hyperlipidemia:Low fat diet discussed and encouraged.   Lipid Panel  Lab Results  Component Value Date   CHOL 103* 12/24/2015   HDL 54 12/24/2015   LDLCALC 33 12/24/2015   TRIG 82 12/24/2015   CHOLHDL 1.9 12/24/2015  Over corrected, decreased med dose

## 2015-12-26 NOTE — Assessment & Plan Note (Signed)

## 2015-12-26 NOTE — Assessment & Plan Note (Signed)
Uncontrolled.Toradol and depo medrol administered IM in the office , to be followed by a short course of oral prednisone and NSAIDS.  

## 2015-12-29 NOTE — Assessment & Plan Note (Signed)
Hyperlipidemia:Low fat diet discussed and encouraged.   Lipid Panel  Lab Results  Component Value Date   CHOL 103* 12/24/2015   HDL 54 12/24/2015   LDLCALC 33 12/24/2015   TRIG 82 12/24/2015   CHOLHDL 1.9 12/24/2015  Over corrected, decreased med dose

## 2015-12-31 ENCOUNTER — Other Ambulatory Visit: Payer: Self-pay | Admitting: Family Medicine

## 2015-12-31 DIAGNOSIS — M25561 Pain in right knee: Secondary | ICD-10-CM

## 2016-01-04 ENCOUNTER — Ambulatory Visit (HOSPITAL_COMMUNITY): Payer: Commercial Managed Care - HMO

## 2016-01-08 ENCOUNTER — Ambulatory Visit (HOSPITAL_COMMUNITY)
Admission: RE | Admit: 2016-01-08 | Discharge: 2016-01-08 | Disposition: A | Discharge status: Home / Self Care | Payer: Commercial Managed Care - HMO | Source: Ambulatory Visit | Attending: Family Medicine | Admitting: Family Medicine

## 2016-01-08 ENCOUNTER — Other Ambulatory Visit: Payer: Self-pay

## 2016-01-08 DIAGNOSIS — S83231A Complex tear of medial meniscus, current injury, right knee, initial encounter: Secondary | ICD-10-CM | POA: Diagnosis not present

## 2016-01-08 DIAGNOSIS — M7121 Synovial cyst of popliteal space [Baker], right knee: Secondary | ICD-10-CM | POA: Diagnosis not present

## 2016-01-08 DIAGNOSIS — S83241A Other tear of medial meniscus, current injury, right knee, initial encounter: Secondary | ICD-10-CM | POA: Insufficient documentation

## 2016-01-08 DIAGNOSIS — S83281A Other tear of lateral meniscus, current injury, right knee, initial encounter: Secondary | ICD-10-CM | POA: Diagnosis not present

## 2016-01-08 DIAGNOSIS — X58XXXA Exposure to other specified factors, initial encounter: Secondary | ICD-10-CM | POA: Diagnosis not present

## 2016-01-08 DIAGNOSIS — M25561 Pain in right knee: Principal | ICD-10-CM

## 2016-01-08 DIAGNOSIS — G8929 Other chronic pain: Secondary | ICD-10-CM

## 2016-01-09 ENCOUNTER — Encounter (HOSPITAL_COMMUNITY): Payer: Commercial Managed Care - HMO | Attending: Hematology & Oncology | Admitting: Hematology & Oncology

## 2016-01-09 ENCOUNTER — Encounter (HOSPITAL_COMMUNITY): Payer: Self-pay | Admitting: Hematology & Oncology

## 2016-01-09 VITALS — BP 135/65 | HR 86 | Temp 97.5°F | Resp 18 | Wt 193.0 lb

## 2016-01-09 DIAGNOSIS — N6091 Unspecified benign mammary dysplasia of right breast: Secondary | ICD-10-CM

## 2016-01-09 DIAGNOSIS — N62 Hypertrophy of breast: Secondary | ICD-10-CM

## 2016-01-09 DIAGNOSIS — D649 Anemia, unspecified: Secondary | ICD-10-CM

## 2016-01-09 DIAGNOSIS — D241 Benign neoplasm of right breast: Secondary | ICD-10-CM | POA: Diagnosis not present

## 2016-01-09 DIAGNOSIS — Z79811 Long term (current) use of aromatase inhibitors: Secondary | ICD-10-CM

## 2016-01-09 DIAGNOSIS — N951 Menopausal and female climacteric states: Secondary | ICD-10-CM

## 2016-01-09 NOTE — Progress Notes (Signed)
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 female is here for follow-up of atypical ductal hyperplasia.  She underwent a R breast lumpectomy with Dr. Brantley Stage on 10/05/2014. She has been underoing diagnostic mammography for ongoing follow-up of a 1.2 x 1.4 x 0.9 cm hypoechoic lobular mass in the R breast at the 2:30 position 6 cm from the nipple.   Jillian [name] is accompanied by her husband. I personally reviewed and went over recent Right knee imaging results with the patient per her request. She notes that if it wasn't for her knee she would feel well. I personally reviewed and discussed her current diagnosis with her husband at length.   She last saw her PCP, Dr. Moshe Cipro, a few weeks ago. Her appetite and bowels are good.   She had some pain in her right knee and an MRI was performed. She was curious about how they might treat her knee. She has previously received two injections to the knee which did not help. She has fallen twice due to her knee. She has an appointment at [facility].   Reports she stopped taking the benazepril because she was not sure what it was for. She began taking it again after talking with Dr. Moshe Cipro.   Her next screening mammogram is due in November.   She does not need any refills at this time.    MEDICAL HISTORY:  Past Medical History  Diagnosis Date  . Dyslipidemia   . Mild obesity   . Postmenopausal   . DJD (degenerative joint disease)     of neck    . Chronic fatigue     . Diabetes mellitus 2011  . Hypertension 2005  . Wears glasses     SURGICAL HISTORY: Past Surgical History  Procedure Laterality Date  . Left breast biopsy    . Appendectomy    . Lesion removal Right 03/30/2013    Procedure: EXCISION OF NEOPLASM ARM;  Surgeon: Jamesetta So, MD;  Location: [facility];  Service: General;  Laterality: Right;  . Cataract Bilateral   . Cyst excision Right     arm  . Eye surgery Right 06/07/2013    cataract  . Eye surgery Left 09/14/2013    cataract  . Abdominal hysterectomy  1992    fibroids  . Breast lumpectomy with radioactive seed localization Right 10/05/2014    Procedure: BREAST LUMPECTOMY WITH RADIOACTIVE SEED LOCALIZATION;  Surgeon: Erroll Luna, MD;  Location: [facility];  Service: General;  Laterality: Right;    SOCIAL HISTORY: Social History   Social History  . Marital Status: Married    Spouse Name: N/A  . Number of Children: N/A  . Years of Education: N/A   Occupational History  . Not on file.   Social History Main Topics  . Smoking status: Never Smoker   . Smokeless tobacco: Not on file  . Alcohol Use: No  . Drug Use: No  . Sexual Activity: Not Currently    Birth Control/ Protection: Surgical   Other Topics Concern  . Not on file   Social History Narrative  The  patient states she is "partially married"  Or "separated" she has 2 sons, 3 grandsons and 1 great grandson. She is a never smoker and does not drink ETOH. She has worked in [employer] and in [employer].  FAMILY HISTORY: Family History  Problem Relation Age of Onset  . Heart failure Father   . Hypertension Father   . Cancer Mother     liver cancer  . Hypertension Sister   . Hypertension Brother   . Diabetes Brother    indicated that her mother is deceased. She indicated that her father is deceased. She indicated that her sister is alive. She indicated that all of her three brothers are alive. She indicated that both of her sons are  alive.  Mother died at 12 from liver cancer, Father died at 81 from CHF.  3 Brothers and 1 sister, 2 brothers have prostate cancer.   ALLERGIES:  is allergic to codeine and propoxyphene n-acetaminophen.  MEDICATIONS:  Current Outpatient Prescriptions  Medication Sig Dispense Refill  . aspirin EC 81 MG tablet Take 81 mg by mouth every evening.     . benazepril (LOTENSIN) 10 MG tablet TAKE 1 TABLET EVERY DAY 90 tablet 0  . Calcium Carbonate (CALCIUM 600 PO) Take 1 tablet by mouth daily.    . cloNIDine (CATAPRES) 0.1 MG tablet TAKE 1 TABLET AT BEDTIME 90 tablet 0  . ibuprofen (ADVIL,MOTRIN) 800 MG tablet Take 1 tablet (800 mg total) by mouth every 8 (eight) hours as needed. 21 tablet 0  . metFORMIN (GLUCOPHAGE-XR) 500 MG 24 hr tablet TAKE 1 TABLET TWICE DAILY 180 tablet 0  . pravastatin (PRAVACHOL) 20 MG tablet     . ranitidine (ZANTAC) 300 MG capsule Take 1 capsule (300 mg total) by mouth every evening. 14 capsule 0  . tamoxifen (NOLVADEX) 20 MG tablet Take 1 tablet (20 mg total) by mouth daily. 90 tablet 4   No current facility-administered medications for this visit.    Review of Systems  Constitutional: Negative for fever, chills, weight loss and malaise/fatigue.       Hot flashes  HENT: Negative for congestion, hearing loss, nosebleeds, sore throat and tinnitus.   Eyes: Negative for blurred vision, double vision, pain and discharge.  Respiratory: Negative for cough, hemoptysis, sputum production, shortness of breath and wheezing.   Cardiovascular: Negative for chest pain, palpitations, claudication, leg swelling and PND.  Gastrointestinal: Negative for heartburn, nausea, vomiting, abdominal pain, diarrhea, constipation, blood in stool and melena.  Genitourinary: Negative for dysuria, urgency, frequency and hematuria.  Musculoskeletal: Positive for joint pain and falls. Negative for myalgias.   Right knee pain. Has fallen twice, attributing it to her knee. Skin: Negative for itching  and rash.  Neurological: Negative for dizziness, tingling, tremors, sensory change, speech change, focal weakness, seizures, loss of consciousness, weakness and headaches.  Endo/Heme/Allergies: Does not bruise/bleed easily.  Psychiatric/Behavioral: Negative for depression, suicidal ideas, memory loss and substance abuse. The patient is not nervous/anxious and does not have insomnia.   14 point review of systems was performed and is negative except as detailed under history of present illness and above   PHYSICAL EXAMINATION: ECOG PERFORMANCE STATUS: 0 - Asymptomatic  Filed Vitals:   01/09/16 1100  BP: 135/65  Pulse: 86  Temp: 97.5 F (36.4 C)  Resp: 18   Filed Weights   01/09/16 1100  Weight: 193 lb (87.544 kg)    Physical Exam  Constitutional: She is oriented to person, place, and time and well-developed, well-nourished,  and in no distress.  HENT:  Head: Normocephalic and atraumatic.  Nose: Nose normal.  Mouth/Throat: Oropharynx is clear and moist. No oropharyngeal exudate.  Eyes: Conjunctivae and EOM are normal. Pupils are equal, round, and reactive to light. Right eye exhibits no discharge. Left eye exhibits no discharge. No scleral icterus.  Neck: Normal range of motion. Neck supple. No tracheal deviation present. No thyromegaly present.  Cardiovascular: Normal rate, regular rhythm and normal heart sounds.  Exam reveals no gallop and no friction rub.   No murmur heard. Pulmonary/Chest: Effort normal and breath sounds normal. She has no wheezes. She has no rales.  Abdominal: Soft. Bowel sounds are normal. She exhibits no distension and no mass. There is no tenderness. There is no rebound and no guarding.  Musculoskeletal: Normal range of motion, R knee wrap in place. She exhibits no edema.  Lymphadenopathy:    She has no cervical adenopathy.  Neurological: She is alert and oriented to person, place, and time. She has normal reflexes. No cranial nerve deficit. Gait normal.  Coordination normal.  Skin: Skin is warm and dry. No rash noted.  Psychiatric: Mood, memory, affect and judgment normal.  Nursing note and vitals reviewed.   LABORATORY DATA:  I have reviewed the data as listed Lab Results  Component Value Date   WBC 6.6 12/24/2015   HGB 12.1 12/24/2015   HCT 37.0 12/24/2015   MCV 93.9 12/24/2015   PLT 199 12/24/2015     Chemistry      Component Value Date/Time   NA 142 12/24/2015 0901   K 4.1 12/24/2015 0901   CL 108 12/24/2015 0901   CO2 25 12/24/2015 0901   BUN 11 12/24/2015 0901   CREATININE 0.89 12/24/2015 0901   CREATININE 0.92 04/18/2015 1314      Component Value Date/Time   CALCIUM 9.2 12/24/2015 0901   ALKPHOS 51 12/24/2015 0901   AST 16 12/24/2015 0901   ALT 12 12/24/2015 0901   BILITOT 0.3 12/24/2015 0901      RADIOGRAPHIC STUDIES: I have personally reviewed the radiological images as listed in HPI and agreed with the findings in the report. Study Result     CLINICAL DATA: Pain for 6 months. No known injury.  EXAM: MRI OF THE RIGHT KNEE WITHOUT CONTRAST  TECHNIQUE: Multiplanar, multisequence MR imaging of the knee was performed. No intravenous contrast was administered.  COMPARISON: None.  FINDINGS: MENISCI  Medial meniscus: Complex tear of the posterior horn-body of the medial meniscus extending to the inferior articular surface.  Lateral meniscus: Radial tear of the posterior horn of the lateral meniscus towards the meniscal root.  LIGAMENTS  Cruciates: Intact ACL and PCL. Mildly expanded ACL as can be seen with mild mucinous degeneration.  Collaterals: Medial collateral ligament is intact. Lateral collateral ligament complex is intact.  CARTILAGE  Patellofemoral: Partial-thickness cartilage loss of the lateral patellofemoral compartment.  Medial: High-grade partial-thickness cartilage loss with areas of full-thickness cartilage loss of the medial femoral condyle and medial tibial  plateau.  Lateral: Mild chondromalacia with partial thickness cartilage loss of the lateral tibial plateau.  Joint: Moderate joint effusion. No plical thickening. Normal Hoffa's fat.  Popliteal Fossa: Small Baker's cyst. Small amount of fluid along the superficial aspect of the medial gastrocnemius muscle. Intact popliteus tendon.  Extensor Mechanism: Intact.  Bones: No focal marrow signal abnormality. No fracture or dislocation.  IMPRESSION: 1. Complex tear of the posterior horn of the medial meniscus extending into the body and to the inferior articular  surface. 2. Radial tear of the posterior horn of the lateral meniscus towards the meniscal root. 3. Tricompartmental cartilage abnormalities as described above. 4. Small leaking Baker cyst.   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 Brantley Stage on 10/05/2014 High Risk Breast Disease Tamoxifen for chemoprevention Mild anemia Hot flashes not improved with Effexor or Gabapentin  She is doing well on tamoxifen. I again reviewed side effects of concern. She has had a hysterectomy. She wishes to continue on chemopreventive Tamoxifen.  I again emphasized the importance of ongoing observation with yearly breast exam and ongoing mammography.    I personally reviewed and went over recent Right knee imaging results with the patient at length.   I personally reviewed and discussed her current diagnosis with her husband at length.   Her next screening mammogram is due in November.   She will return for follow up in 4 months. After this next visit, we will space our visits out to every 6 months.   All questions were answered. The patient knows to call the clinic with any problems, questions or concerns.   This document serves as a record of  services personally performed by Ancil Linsey, MD. It was created on her behalf by Arlyce Harman, a trained medical scribe. The creation of this record is based on the scribe's personal observations and the provider's statements to them. This document has been checked and approved by the attending provider.  I have reviewed the above documentation for accuracy and completeness, and I agree with the above.  This note was electronically signed.    Kelby Fam. Whitney Muse, MD

## 2016-01-09 NOTE — Patient Instructions (Addendum)
[place] at [facility] Discharge Instructions  RECOMMENDATIONS MADE BY THE CONSULTANT AND ANY TEST RESULTS WILL BE SENT TO YOUR REFERRING PHYSICIAN.    Exam and discussion by Dr Whitney Muse today It is important for you to get your mammograms every year We will order your mammogram the next time you come in  You are at a high risked breast disease, you do not have cancer Return to see the doctor in 4 months, then we can space you out to 6 months Please call the clinic if you have any questions or concerns     Thank you for choosing [place] at [facility] to provide your oncology and hematology care.  To afford each patient quality time with our provider, please arrive at least 15 minutes before your scheduled appointment time.   Beginning January 23rd 2017 lab work for the [facility] will be done in the  Main lab at [facility] on 1st floor. If you have a lab appointment with the [facility] please come in thru the  Main Entrance and check in at the main information desk  You need to re-schedule your appointment should you arrive 10 or more minutes late.  We strive to give you quality time with our providers, and arriving late affects you and other patients whose appointments are after yours.  Also, if you no show three or more times for appointments you may be dismissed from the clinic at the providers discretion.     Again, thank you for choosing [facility].  Our hope is that these requests will decrease the amount of time that you wait before being seen by our physicians.       _____________________________________________________________  Should you have questions after your visit to [facility], please contact our office at [phone] between the hours of 8:30 a.m. and 4:30 p.m.  Voicemails left after 4:30 p.m. will not be returned until the following business day.  For prescription refill requests,  have your pharmacy contact our office.         Resources For Cancer Patients and their Caregivers ? American Cancer Society: Can assist with transportation, wigs, general needs, runs Look Good Feel Better.        514-062-5521 ? Cancer Care: Provides financial assistance, online support groups, medication/co-pay assistance.  1-800-813-HOPE 6621375449) ? [place] Assists [place] cancer patients and their families through emotional , educational and financial support.  513-867-6037 ? [place] DSS Where to apply for food stamps, Medicaid and utility assistance. 319-519-1659 ? [employer]: Transportation to medical appointments. 219-759-5506 ? Social Security Administration: May apply for disability if have a Stage IV cancer. (901) 519-8211 (402)844-9088 ? [employer], Disability and Transit Services: Assists with nutrition, care and transit needs. ([phone]

## 2016-01-10 ENCOUNTER — Telehealth: Payer: Self-pay | Admitting: Family Medicine

## 2016-01-10 ENCOUNTER — Other Ambulatory Visit: Payer: Self-pay

## 2016-01-10 DIAGNOSIS — S83206A Unspecified tear of unspecified meniscus, current injury, right knee, initial encounter: Secondary | ICD-10-CM | POA: Diagnosis not present

## 2016-01-10 DIAGNOSIS — M1711 Unilateral primary osteoarthritis, right knee: Secondary | ICD-10-CM | POA: Diagnosis not present

## 2016-01-10 MED ORDER — BENAZEPRIL HCL 10 MG PO TABS: 10.0000 mg | ORAL_TABLET | Freq: Every day | ORAL | Status: DC

## 2016-01-10 NOTE — Telephone Encounter (Signed)
Medication refilled

## 2016-01-10 NOTE — Telephone Encounter (Signed)
Patient is asking for a refill on medication benazepril (LOTENSIN) 10 MG tablet  Sent to [facility]

## 2016-01-29 ENCOUNTER — Other Ambulatory Visit: Payer: Self-pay | Admitting: Family Medicine

## 2016-02-08 DIAGNOSIS — M23351 Other meniscus derangements, posterior horn of lateral meniscus, right knee: Secondary | ICD-10-CM | POA: Diagnosis not present

## 2016-02-08 DIAGNOSIS — M1711 Unilateral primary osteoarthritis, right knee: Secondary | ICD-10-CM | POA: Diagnosis not present

## 2016-02-08 DIAGNOSIS — S83231A Complex tear of medial meniscus, current injury, right knee, initial encounter: Secondary | ICD-10-CM | POA: Diagnosis not present

## 2016-02-14 ENCOUNTER — Encounter (HOSPITAL_COMMUNITY)
Admission: RE | Admit: 2016-02-14 | Discharge: 2016-02-14 | Disposition: A | Discharge status: Home / Self Care | Payer: Commercial Managed Care - HMO | Source: Ambulatory Visit | Attending: Orthopedic Surgery | Admitting: Orthopedic Surgery

## 2016-02-14 ENCOUNTER — Encounter (HOSPITAL_COMMUNITY): Payer: Self-pay

## 2016-02-14 ENCOUNTER — Ambulatory Visit: Payer: Self-pay | Admitting: Orthopedic Surgery

## 2016-02-14 DIAGNOSIS — Z01812 Encounter for preprocedural laboratory examination: Secondary | ICD-10-CM | POA: Diagnosis not present

## 2016-02-14 DIAGNOSIS — S83241A Other tear of medial meniscus, current injury, right knee, initial encounter: Secondary | ICD-10-CM | POA: Insufficient documentation

## 2016-02-14 DIAGNOSIS — X58XXXA Exposure to other specified factors, initial encounter: Secondary | ICD-10-CM | POA: Diagnosis not present

## 2016-02-14 DIAGNOSIS — Z01818 Encounter for other preprocedural examination: Secondary | ICD-10-CM | POA: Insufficient documentation

## 2016-02-14 DIAGNOSIS — I491 Atrial premature depolarization: Secondary | ICD-10-CM | POA: Insufficient documentation

## 2016-02-14 LAB — CBC
HCT: 37 % (ref 36.0–46.0)
Hemoglobin: 11.9 g/dL — ABNORMAL LOW (ref 12.0–15.0)
MCH: 30.4 pg (ref 26.0–34.0)
MCHC: 32.2 g/dL (ref 30.0–36.0)
MCV: 94.4 fL (ref 78.0–100.0)
PLATELETS: 186 10*3/uL (ref 150–400)
RBC: 3.92 MIL/uL (ref 3.87–5.11)
RDW: 13.8 % (ref 11.5–15.5)
WBC: 8.6 10*3/uL (ref 4.0–10.5)

## 2016-02-14 LAB — BASIC METABOLIC PANEL
Anion gap: 6 (ref 5–15)
BUN: 13 mg/dL (ref 6–20)
CO2: 26 mmol/L (ref 22–32)
CREATININE: 0.95 mg/dL (ref 0.44–1.00)
Calcium: 9.3 mg/dL (ref 8.9–10.3)
Chloride: 112 mmol/L — ABNORMAL HIGH (ref 101–111)
GFR, EST NON AFRICAN AMERICAN: 58 mL/min — AB (ref >60)
Glucose, Bld: 96 mg/dL (ref 65–99)
Potassium: 4.7 mmol/L (ref 3.5–5.1)
SODIUM: 144 mmol/L (ref 135–145)

## 2016-02-14 NOTE — Progress Notes (Signed)
Preoperative surgical orders have been place into the Epic hospital system for Jillian [name] on 02/14/2016, 1:29 PM  by Mickel Crow for surgery on 02-26-16.  Preop [place] orders including IV Tylenol and IV Decadron as long as there are no contraindications to the above medications. Arlee Muslim, PA-C

## 2016-02-14 NOTE — Patient Instructions (Addendum)
RAE MASLOWSKI  02/14/2016   Your procedure is scheduled on: Tuesday  February 26, 2016  Report to [facility] Main  Entrance take   elevators to 3rd floor to  [place] at 11:00 AM.  Call this number if you have problems the morning of surgery [phone]   Remember: ONLY 1 PERSON MAY GO WITH YOU TO SHORT STAY TO GET  READY MORNING OF [place].  Do not eat food After Midnight but may take clear liquids till 7:00 am day of surgery then nothing by mouth.      Take these medicines the morning of surgery with A SIP OF WATER: NONE DO NOT TAKE ANY DIABETIC MEDICATIONS DAY OF YOUR SURGERY                               You may not have any metal on your body including hair pins and              piercings  Do not wear jewelry, make-up, lotions, powders or perfumes, deodorant             Do not wear nail polish.  Do not shave  48 hours prior to surgery.                Do not bring valuables to the hospital. [place].  Contacts, dentures or bridgework may not be worn into surgery.      Patients discharged the day of surgery will not be allowed to drive home.  Name and phone number of your driver:Marion Andree Elk (sister)  _____________________________________________________________________             [facility] - Preparing for Surgery Before surgery, you can play an important role.  Because skin is not sterile, your skin needs to be as free of germs as possible.  You can reduce the number of germs on your skin by washing with CHG (chlorahexidine gluconate) soap before surgery.  CHG is an antiseptic cleaner which kills germs and bonds with the skin to continue killing germs even after washing. Please DO NOT use if you have an allergy to CHG or antibacterial soaps.  If your skin becomes reddened/irritated stop using the CHG and inform your nurse when you arrive at Short Stay. Do not shave (including legs and  underarms) for at least 48 hours prior to the first CHG shower.  You may shave your face/neck. Please follow these instructions carefully:  1.  Shower with CHG Soap the night before surgery and the  morning of Surgery.  2.  If you choose to wash your hair, wash your hair first as usual with your  normal  shampoo.  3.  After you shampoo, rinse your hair and body thoroughly to remove the  shampoo.                           4.  Use CHG as you would any other liquid soap.  You can apply chg directly  to the skin and wash                       Gently with a scrungie or clean washcloth.  5.  Apply the CHG Soap to your body ONLY FROM THE NECK DOWN.   Do not use on face/ open                           Wound or open sores. Avoid contact with eyes, ears mouth and genitals (private parts).                       Wash face,  Genitals (private parts) with your normal soap.             6.  Wash thoroughly, paying special attention to the area where your surgery  will be performed.  7.  Thoroughly rinse your body with warm water from the neck down.  8.  DO NOT shower/wash with your normal soap after using and rinsing off  the CHG Soap.                9.  Pat yourself dry with a clean towel.            10.  Wear clean pajamas.            11.  Place clean sheets on your bed the night of your first shower and do not  sleep with pets. Day of Surgery : Do not apply any lotions/deodorants the morning of surgery.  Please wear clean clothes to the hospital/surgery center.  FAILURE TO FOLLOW THESE INSTRUCTIONS MAY RESULT IN THE CANCELLATION OF YOUR SURGERY PATIENT SIGNATURE_________________________________  NURSE SIGNATURE__________________________________  ________________________________________________________________________    CLEAR LIQUID DIET   Foods Allowed                                                                     Foods Excluded  Coffee and tea, regular and decaf                              liquids that you cannot  Plain Jell-O in any flavor                                             see through such as: Fruit ices (not with fruit pulp)                                     milk, soups, orange juice  Iced Popsicles                                    All solid food Carbonated beverages, regular and diet                                    Cranberry, grape and apple juices Sports drinks like Gatorade Lightly seasoned clear broth or consume(fat free) Sugar, honey syrup  Sample Menu Breakfast  Lunch                                     Supper Cranberry juice                    Beef broth                            Chicken broth Jell-O                                     Grape juice                           Apple juice Coffee or tea                        Jell-O                                      Popsicle                                                Coffee or tea                        Coffee or tea  _____________________________________________________________________

## 2016-02-14 NOTE — Progress Notes (Signed)
Please place surgical orders in epic. Thanks.

## 2016-02-16 DIAGNOSIS — M898X1 Other specified disorders of bone, shoulder: Secondary | ICD-10-CM | POA: Diagnosis not present

## 2016-02-17 ENCOUNTER — Emergency Department (HOSPITAL_COMMUNITY): Payer: Commercial Managed Care - HMO

## 2016-02-17 ENCOUNTER — Emergency Department (HOSPITAL_COMMUNITY)
Admission: EM | Admit: 2016-02-17 | Discharge: 2016-02-17 | Disposition: A | Discharge status: Home / Self Care | Payer: Commercial Managed Care - HMO | Attending: Emergency Medicine | Admitting: Emergency Medicine

## 2016-02-17 ENCOUNTER — Encounter (HOSPITAL_COMMUNITY): Payer: Self-pay | Admitting: Emergency Medicine

## 2016-02-17 DIAGNOSIS — X58XXXA Exposure to other specified factors, initial encounter: Secondary | ICD-10-CM | POA: Diagnosis not present

## 2016-02-17 DIAGNOSIS — S46912A Strain of unspecified muscle, fascia and tendon at shoulder and upper arm level, left arm, initial encounter: Secondary | ICD-10-CM | POA: Diagnosis not present

## 2016-02-17 DIAGNOSIS — Z79899 Other long term (current) drug therapy: Secondary | ICD-10-CM | POA: Diagnosis not present

## 2016-02-17 DIAGNOSIS — R0781 Pleurodynia: Secondary | ICD-10-CM | POA: Diagnosis not present

## 2016-02-17 DIAGNOSIS — E785 Hyperlipidemia, unspecified: Secondary | ICD-10-CM | POA: Diagnosis not present

## 2016-02-17 DIAGNOSIS — S46812A Strain of other muscles, fascia and tendons at shoulder and upper arm level, left arm, initial encounter: Secondary | ICD-10-CM | POA: Insufficient documentation

## 2016-02-17 DIAGNOSIS — M79602 Pain in left arm: Secondary | ICD-10-CM | POA: Diagnosis present

## 2016-02-17 DIAGNOSIS — Y999 Unspecified external cause status: Secondary | ICD-10-CM | POA: Diagnosis not present

## 2016-02-17 DIAGNOSIS — Z7982 Long term (current) use of aspirin: Secondary | ICD-10-CM | POA: Diagnosis not present

## 2016-02-17 DIAGNOSIS — E119 Type 2 diabetes mellitus without complications: Secondary | ICD-10-CM | POA: Diagnosis not present

## 2016-02-17 DIAGNOSIS — M542 Cervicalgia: Secondary | ICD-10-CM | POA: Insufficient documentation

## 2016-02-17 DIAGNOSIS — Y939 Activity, unspecified: Secondary | ICD-10-CM | POA: Diagnosis not present

## 2016-02-17 DIAGNOSIS — I10 Essential (primary) hypertension: Secondary | ICD-10-CM | POA: Diagnosis not present

## 2016-02-17 DIAGNOSIS — Z7984 Long term (current) use of oral hypoglycemic drugs: Secondary | ICD-10-CM | POA: Insufficient documentation

## 2016-02-17 DIAGNOSIS — Y929 Unspecified place or not applicable: Secondary | ICD-10-CM | POA: Insufficient documentation

## 2016-02-17 LAB — CBC WITH DIFFERENTIAL/PLATELET
Basophils Absolute: 0 10*3/uL (ref 0.0–0.1)
Basophils Relative: 0 %
EOS PCT: 1 %
Eosinophils Absolute: 0 10*3/uL (ref 0.0–0.7)
HEMATOCRIT: 35.9 % — AB (ref 36.0–46.0)
HEMOGLOBIN: 11.5 g/dL — AB (ref 12.0–15.0)
LYMPHS ABS: 2.5 10*3/uL (ref 0.7–4.0)
LYMPHS PCT: 40 %
MCH: 30.3 pg (ref 26.0–34.0)
MCHC: 32 g/dL (ref 30.0–36.0)
MCV: 94.5 fL (ref 78.0–100.0)
Monocytes Absolute: 0.3 10*3/uL (ref 0.1–1.0)
Monocytes Relative: 5 %
NEUTROS ABS: 3.5 10*3/uL (ref 1.7–7.7)
NEUTROS PCT: 54 %
Platelets: 169 10*3/uL (ref 150–400)
RBC: 3.8 MIL/uL — AB (ref 3.87–5.11)
RDW: 13.5 % (ref 11.5–15.5)
WBC: 6.3 10*3/uL (ref 4.0–10.5)

## 2016-02-17 LAB — I-STAT TROPONIN, ED: Troponin i, poc: 0 ng/mL (ref 0.00–0.08)

## 2016-02-17 LAB — COMPREHENSIVE METABOLIC PANEL
ALT: 13 U/L — ABNORMAL LOW (ref 14–54)
ANION GAP: 5 (ref 5–15)
AST: 16 U/L (ref 15–41)
Albumin: 3.7 g/dL (ref 3.5–5.0)
Alkaline Phosphatase: 50 U/L (ref 38–126)
BUN: 10 mg/dL (ref 6–20)
CALCIUM: 8.9 mg/dL (ref 8.9–10.3)
CHLORIDE: 109 mmol/L (ref 101–111)
CO2: 26 mmol/L (ref 22–32)
Creatinine, Ser: 0.8 mg/dL (ref 0.44–1.00)
GFR calc non Af Amer: >60 mL/min (ref >60)
Glucose, Bld: 93 mg/dL (ref 65–99)
Potassium: 4.1 mmol/L (ref 3.5–5.1)
Sodium: 140 mmol/L (ref 135–145)
Total Bilirubin: 0.4 mg/dL (ref 0.3–1.2)
Total Protein: 7.1 g/dL (ref 6.5–8.1)

## 2016-02-17 MED ORDER — HYDROCODONE-ACETAMINOPHEN 5-325 MG PO TABS
1.0000 | ORAL_TABLET | Freq: Once | ORAL | Status: AC
  Administered 2016-02-17: 1 via ORAL
  Filled 2016-02-17: qty 1

## 2016-02-17 MED ORDER — TRAMADOL HCL 50 MG PO TABS: 50.0000 mg | ORAL_TABLET | Freq: Four times a day (QID) | ORAL | Status: DC | PRN

## 2016-02-17 MED ORDER — HYDROCODONE-ACETAMINOPHEN 5-325 MG PO TABS: 1.0000 | ORAL_TABLET | Freq: Four times a day (QID) | ORAL | Status: DC | PRN

## 2016-02-17 NOTE — ED Provider Notes (Signed)
CSN: [id number]     Arrival date & time 02/17/16  1020 History  By signing my name below, I, Mesha Guinyard, attest that this documentation has been prepared under the direction and in the presence of Treatment Team:  Attending Provider: Milton Ferguson, MD.  Electronically Signed: Verlee Monte, Medical Scribe. 02/17/2016. 10:45 AM.   Chief Complaint  Patient presents with  . Arm Pain   Patient is a 75 y.o. female presenting with arm pain. The history is provided by the patient (Patient complains of pain to the left upper back). No language interpreter was used.  Arm Pain This is a new problem. The current episode started 2 days ago. The problem occurs hourly. The problem has not changed since onset.Pertinent negatives include no chest pain, no abdominal pain, no headaches and no shortness of breath. Exacerbated by: Movement.   HPI Comments: Jillian [name] is a 75 y.o. female who presents to the Emergency Department complaining of sharp intermittent back pain onset 2 days ago. Pt reports the pain radiates to her neck and left arm. She states that she has tried medication with no relief for her symptoms. She denies abdominal pain, fever, SOB and any other symptoms.  Past Medical History  Diagnosis Date  . Dyslipidemia   . Mild obesity   . Postmenopausal   . DJD (degenerative joint disease)     of neck    . Chronic fatigue   . Diabetes mellitus 2011  . Hypertension 2005  . Wears glasses    Past Surgical History  Procedure Laterality Date  . Left breast biopsy    . Appendectomy    . Lesion removal Right 03/30/2013    Procedure: EXCISION OF NEOPLASM ARM;  Surgeon: Jamesetta So, MD;  Location: [facility];  Service: General;  Laterality: Right;  . Cataract Bilateral   . Cyst excision Right     arm  . Eye surgery Right 06/07/2013    cataract  . Eye surgery Left 09/14/2013    cataract  . Abdominal hysterectomy  1992    fibroids  . Breast lumpectomy with radioactive seed localization  Right 10/05/2014    Procedure: BREAST LUMPECTOMY WITH RADIOACTIVE SEED LOCALIZATION;  Surgeon: Erroll [facility], MD;  Location: [facility];  Service: General;  Laterality: Right;  . Tubal ligation     Family History  Problem Relation Age of Onset  . Heart failure Father   . Hypertension Father   . Cancer Mother     liver cancer  . Hypertension Sister   . Hypertension Brother   . Diabetes Brother    Social History  Substance Use Topics  . Smoking status: Never Smoker   . Smokeless tobacco: Never Used  . Alcohol Use: No   OB History    No data available     Review of Systems  Constitutional: Negative for appetite change and fatigue.  HENT: Negative for congestion, ear discharge and sinus pressure.   Eyes: Negative for discharge.  Respiratory: Negative for cough and shortness of breath.   Cardiovascular: Negative for chest pain.  Gastrointestinal: Negative for abdominal pain and diarrhea.  Genitourinary: Negative for frequency and hematuria.  Musculoskeletal: Positive for back pain and neck pain.  Skin: Negative for rash.  Neurological: Negative for seizures and headaches.  Psychiatric/Behavioral: Negative for hallucinations.   Allergies  Codeine and Propoxyphene n-acetaminophen  Home Medications   Prior to Admission medications   Medication Sig Start Date End Date Taking? Authorizing Provider  aspirin  EC 81 MG tablet Take 81 mg by mouth every evening.    Yes Historical Provider, MD  benazepril (LOTENSIN) 10 MG tablet Take 1 tablet (10 mg total) by mouth daily. 01/10/16  Yes Fayrene Helper, MD  Calcium Carbonate (CALCIUM 600 PO) Take 1 tablet by mouth daily.   Yes Historical Provider, MD  cloNIDine (CATAPRES) 0.1 MG tablet TAKE 1 TABLET AT BEDTIME 01/31/16  Yes Fayrene Helper, MD  ibuprofen (ADVIL,MOTRIN) 800 MG tablet Take 1 tablet (800 mg total) by mouth every 8 (eight) hours as needed. Patient taking differently: Take 800 mg by mouth every 8 (eight)  hours as needed (For pain.).  12/26/15  Yes Fayrene Helper, MD  metFORMIN (GLUCOPHAGE-XR) 500 MG 24 hr tablet TAKE 1 TABLET TWICE DAILY 01/31/16  Yes Fayrene Helper, MD  OVER THE COUNTER MEDICATION Take 1 capsule by mouth daily. Liver Cleanse Formula   Yes Historical Provider, MD  OVER THE COUNTER MEDICATION Take 1 capsule by mouth daily. Estrotone   Yes Historical Provider, MD  pravastatin (PRAVACHOL) 10 MG tablet Take 10 mg by mouth daily. 12/27/15  Yes Historical Provider, MD   BP 158/87 mmHg  Pulse 82  Temp(Src) 97.6 F (36.4 C) (Oral)  Resp 16  Ht 5\' 5"  (1.651 m)  Wt 196 lb (88.905 kg)  BMI 32.62 kg/m2  SpO2 97% Physical Exam  Constitutional: She is oriented to person, place, and time. She appears well-developed.  HENT:  Head: Normocephalic.  Eyes: Conjunctivae and EOM are normal. No scleral icterus.  Neck: Neck supple. No thyromegaly present.  Cardiovascular: Normal rate and regular rhythm.  Exam reveals no gallop and no friction rub.   No murmur heard. Pulmonary/Chest: No stridor. She has no wheezes. She has no rales. She exhibits no tenderness.  Abdominal: She exhibits no distension. There is no tenderness. There is no rebound.  Musculoskeletal: Normal range of motion. She exhibits tenderness. She exhibits no edema.  Tenderness over the left trapezius muscle  Lymphadenopathy:    She has no cervical adenopathy.  Neurological: She is oriented to person, place, and time. She exhibits normal muscle tone. Coordination normal.  Skin: No rash noted. No erythema.  Psychiatric: She has a normal mood and affect. Her behavior is normal.    ED Course  Procedures  DIAGNOSTIC STUDIES: Oxygen Saturation is 96% on RA, NL by my interpretation.    COORDINATION OF CARE: 10:43 AM Discussed treatment plan with pt at bedside and pt agreed to plan.   Labs Review Labs Reviewed  CBC WITH DIFFERENTIAL/PLATELET - Abnormal; Notable for the following:    RBC 3.80 (*)    Hemoglobin 11.5  (*)    HCT 35.9 (*)    All other components within normal limits  COMPREHENSIVE METABOLIC PANEL - Abnormal; Notable for the following:    ALT 13 (*)    All other components within normal limits  I-STAT TROPOININ, ED    Imaging Review Dg Chest 2 View  02/17/2016  CLINICAL DATA:  75 year old female with upper left back and rib pain for the past 2 days. No known injury. EXAM: CHEST  2 VIEW COMPARISON:  Prior thoracic spine radiographs 09/28/2014; prior chest x-ray 12/15/2011 FINDINGS: Cardiac and mediastinal contours are unchanged within normal limits. Mildly atherosclerotic and tortuous thoracic aorta. No focal airspace consolidation, pulmonary edema, pleural effusion or pneumothorax. No suspicious nodule or mass. Osseous structures are intact and unremarkable for age. IMPRESSION: No active cardiopulmonary disease. Electronically Signed   By: Myrle Sheng  Laurence Ferrari M.D.   On: 02/17/2016 11:40   I have personally reviewed and evaluated these images and lab results as part of my medical decision-making.   MDM   Final diagnoses:  None   Patient with pain in her left trapezius muscle worse with movement labs and x-rays unremarkable. Suspect muscular  skeletal pain.   Patient is going to take her Motrin 3 times a day and she's been given some Ultram for breakthrough pain. She will follow-up with her PCP as needed   Milton Ferguson, MD 02/17/16 1409

## 2016-02-17 NOTE — Discharge Instructions (Signed)
Follow up with your md if not improving. °

## 2016-02-17 NOTE — ED Notes (Signed)
Pt reports pain in left shoulder blade radiating into left arm x2 days.  Pt denies sob, n/v/d, dizziness.  Pt denies injury, has full ROM.

## 2016-02-26 ENCOUNTER — Ambulatory Visit (HOSPITAL_COMMUNITY)
Admission: RE | Admit: 2016-02-26 | Discharge: 2016-02-26 | Disposition: A | Discharge status: Home / Self Care | Payer: Commercial Managed Care - HMO | Source: Ambulatory Visit | Attending: Orthopedic Surgery | Admitting: Orthopedic Surgery

## 2016-02-26 ENCOUNTER — Encounter (HOSPITAL_COMMUNITY)
Admission: RE | Disposition: A | Discharge status: Home / Self Care | Payer: Self-pay | Source: Ambulatory Visit | Attending: Orthopedic Surgery

## 2016-02-26 ENCOUNTER — Ambulatory Visit (HOSPITAL_COMMUNITY): Payer: Commercial Managed Care - HMO | Admitting: Registered Nurse

## 2016-02-26 ENCOUNTER — Encounter (HOSPITAL_COMMUNITY): Payer: Self-pay | Admitting: *Deleted

## 2016-02-26 DIAGNOSIS — I10 Essential (primary) hypertension: Secondary | ICD-10-CM | POA: Diagnosis not present

## 2016-02-26 DIAGNOSIS — Z7982 Long term (current) use of aspirin: Secondary | ICD-10-CM | POA: Insufficient documentation

## 2016-02-26 DIAGNOSIS — M25561 Pain in right knee: Secondary | ICD-10-CM | POA: Diagnosis present

## 2016-02-26 DIAGNOSIS — Z79899 Other long term (current) drug therapy: Secondary | ICD-10-CM | POA: Insufficient documentation

## 2016-02-26 DIAGNOSIS — X58XXXA Exposure to other specified factors, initial encounter: Secondary | ICD-10-CM | POA: Diagnosis not present

## 2016-02-26 DIAGNOSIS — M1711 Unilateral primary osteoarthritis, right knee: Secondary | ICD-10-CM | POA: Diagnosis not present

## 2016-02-26 DIAGNOSIS — E119 Type 2 diabetes mellitus without complications: Secondary | ICD-10-CM | POA: Insufficient documentation

## 2016-02-26 DIAGNOSIS — I129 Hypertensive chronic kidney disease with stage 1 through stage 4 chronic kidney disease, or unspecified chronic kidney disease: Secondary | ICD-10-CM | POA: Diagnosis not present

## 2016-02-26 DIAGNOSIS — M2241 Chondromalacia patellae, right knee: Secondary | ICD-10-CM | POA: Insufficient documentation

## 2016-02-26 DIAGNOSIS — S83241A Other tear of medial meniscus, current injury, right knee, initial encounter: Secondary | ICD-10-CM | POA: Diagnosis not present

## 2016-02-26 DIAGNOSIS — Z7984 Long term (current) use of oral hypoglycemic drugs: Secondary | ICD-10-CM | POA: Diagnosis not present

## 2016-02-26 DIAGNOSIS — Z79891 Long term (current) use of opiate analgesic: Secondary | ICD-10-CM | POA: Insufficient documentation

## 2016-02-26 DIAGNOSIS — N189 Chronic kidney disease, unspecified: Secondary | ICD-10-CM | POA: Diagnosis not present

## 2016-02-26 DIAGNOSIS — S83249A Other tear of medial meniscus, current injury, unspecified knee, initial encounter: Secondary | ICD-10-CM | POA: Diagnosis present

## 2016-02-26 DIAGNOSIS — M170 Bilateral primary osteoarthritis of knee: Secondary | ICD-10-CM

## 2016-02-26 DIAGNOSIS — M23321 Other meniscus derangements, posterior horn of medial meniscus, right knee: Secondary | ICD-10-CM | POA: Diagnosis not present

## 2016-02-26 DIAGNOSIS — Z791 Long term (current) use of non-steroidal anti-inflammatories (NSAID): Secondary | ICD-10-CM | POA: Insufficient documentation

## 2016-02-26 HISTORY — PX: KNEE ARTHROSCOPY: SHX127

## 2016-02-26 LAB — GLUCOSE, CAPILLARY
GLUCOSE-CAPILLARY: 101 mg/dL — AB (ref 65–99)
Glucose-Capillary: 102 mg/dL — ABNORMAL HIGH (ref 65–99)

## 2016-02-26 SURGERY — ARTHROSCOPY, KNEE
Anesthesia: General | Site: Knee | Laterality: Right

## 2016-02-26 MED ORDER — ACETAMINOPHEN 10 MG/ML IV SOLN
INTRAVENOUS | Status: AC
  Filled 2016-02-26: qty 100

## 2016-02-26 MED ORDER — IBUPROFEN 800 MG PO TABS: 800.0000 mg | ORAL_TABLET | Freq: Three times a day (TID) | ORAL | Status: DC | PRN

## 2016-02-26 MED ORDER — LACTATED RINGERS IV SOLN
INTRAVENOUS | Status: DC | PRN
  Administered 2016-02-26: 13:00:00 via INTRAVENOUS

## 2016-02-26 MED ORDER — BUPIVACAINE-EPINEPHRINE 0.25% -1:200000 IJ SOLN
INTRAMUSCULAR | Status: AC
  Filled 2016-02-26: qty 1

## 2016-02-26 MED ORDER — PROPOFOL 10 MG/ML IV BOLUS
INTRAVENOUS | Status: DC | PRN
  Administered 2016-02-26: 150 mg via INTRAVENOUS

## 2016-02-26 MED ORDER — POVIDONE-IODINE 10 % EX SWAB: 2.0000 "application " | Freq: Once | CUTANEOUS | Status: DC

## 2016-02-26 MED ORDER — CEFAZOLIN SODIUM-DEXTROSE 2-4 GM/100ML-% IV SOLN
2.0000 g | INTRAVENOUS | Status: AC
  Administered 2016-02-26: 2 g via INTRAVENOUS
  Filled 2016-02-26: qty 100

## 2016-02-26 MED ORDER — METHOCARBAMOL 500 MG PO TABS: 500.0000 mg | ORAL_TABLET | Freq: Four times a day (QID) | ORAL | Status: DC

## 2016-02-26 MED ORDER — ACETAMINOPHEN 10 MG/ML IV SOLN
1000.0000 mg | Freq: Once | INTRAVENOUS | Status: AC
  Administered 2016-02-26: 1000 mg via INTRAVENOUS
  Filled 2016-02-26: qty 100

## 2016-02-26 MED ORDER — ONDANSETRON HCL 4 MG/2ML IJ SOLN
INTRAMUSCULAR | Status: DC | PRN
  Administered 2016-02-26: 4 mg via INTRAVENOUS

## 2016-02-26 MED ORDER — HYDROCODONE-ACETAMINOPHEN 5-325 MG PO TABS: 1.0000 | ORAL_TABLET | ORAL | Status: DC | PRN

## 2016-02-26 MED ORDER — DEXAMETHASONE SODIUM PHOSPHATE 10 MG/ML IJ SOLN
INTRAMUSCULAR | Status: AC
  Filled 2016-02-26: qty 1

## 2016-02-26 MED ORDER — FENTANYL CITRATE (PF) 100 MCG/2ML IJ SOLN
INTRAMUSCULAR | Status: AC
  Filled 2016-02-26: qty 2

## 2016-02-26 MED ORDER — HYDROCODONE-ACETAMINOPHEN 5-325 MG PO TABS
1.0000 | ORAL_TABLET | ORAL | Status: DC | PRN
  Administered 2016-02-26: 1 via ORAL
  Filled 2016-02-26: qty 1

## 2016-02-26 MED ORDER — PROPOFOL 10 MG/ML IV BOLUS
INTRAVENOUS | Status: AC
  Filled 2016-02-26: qty 20

## 2016-02-26 MED ORDER — EPHEDRINE SULFATE 50 MG/ML IJ SOLN
INTRAMUSCULAR | Status: AC
  Filled 2016-02-26: qty 1

## 2016-02-26 MED ORDER — LACTATED RINGERS IR SOLN
Status: DC | PRN
  Administered 2016-02-26: 9000 mL

## 2016-02-26 MED ORDER — FENTANYL CITRATE (PF) 100 MCG/2ML IJ SOLN
INTRAMUSCULAR | Status: DC | PRN
  Administered 2016-02-26: 50 ug via INTRAVENOUS
  Administered 2016-02-26 (×2): 25 ug via INTRAVENOUS

## 2016-02-26 MED ORDER — LIDOCAINE 2% (20 MG/ML) 5 ML SYRINGE
INTRAMUSCULAR | Status: DC | PRN
  Administered 2016-02-26: 60 mg via INTRAVENOUS

## 2016-02-26 MED ORDER — FENTANYL CITRATE (PF) 100 MCG/2ML IJ SOLN
25.0000 ug | INTRAMUSCULAR | Status: DC | PRN
Start: 2016-02-26 — End: 2016-02-26
  Administered 2016-02-26: 50 ug via INTRAVENOUS
  Administered 2016-02-26 (×2): 25 ug via INTRAVENOUS
  Administered 2016-02-26: 50 ug via INTRAVENOUS

## 2016-02-26 MED ORDER — DEXAMETHASONE SODIUM PHOSPHATE 10 MG/ML IJ SOLN
10.0000 mg | Freq: Once | INTRAMUSCULAR | Status: AC
  Administered 2016-02-26: 10 mg via INTRAVENOUS

## 2016-02-26 MED ORDER — CEFAZOLIN SODIUM-DEXTROSE 2-4 GM/100ML-% IV SOLN
INTRAVENOUS | Status: AC
  Filled 2016-02-26: qty 100

## 2016-02-26 MED ORDER — HYDROMORPHONE HCL 1 MG/ML IJ SOLN
INTRAMUSCULAR | Status: AC
  Filled 2016-02-26: qty 1

## 2016-02-26 MED ORDER — CHLORHEXIDINE GLUCONATE 4 % EX LIQD: 60.0000 mL | Freq: Once | CUTANEOUS | Status: DC

## 2016-02-26 MED ORDER — BUPIVACAINE-EPINEPHRINE 0.25% -1:200000 IJ SOLN
INTRAMUSCULAR | Status: DC | PRN
  Administered 2016-02-26: 20 mL

## 2016-02-26 MED ORDER — ONDANSETRON HCL 4 MG/2ML IJ SOLN
INTRAMUSCULAR | Status: AC
  Filled 2016-02-26: qty 2

## 2016-02-26 MED ORDER — PROMETHAZINE HCL 25 MG/ML IJ SOLN: 6.2500 mg | INTRAMUSCULAR | Status: DC | PRN

## 2016-02-26 MED ORDER — SODIUM CHLORIDE 0.9 % IJ SOLN
INTRAMUSCULAR | Status: AC
  Filled 2016-02-26: qty 10

## 2016-02-26 MED ORDER — TRAMADOL HCL 50 MG PO TABS: 50.0000 mg | ORAL_TABLET | Freq: Four times a day (QID) | ORAL | Status: DC | PRN

## 2016-02-26 MED ORDER — ATROPINE SULFATE 0.4 MG/ML IJ SOLN
INTRAMUSCULAR | Status: AC
  Filled 2016-02-26: qty 1

## 2016-02-26 MED ORDER — LIDOCAINE HCL (CARDIAC) 20 MG/ML IV SOLN
INTRAVENOUS | Status: AC
  Filled 2016-02-26: qty 5

## 2016-02-26 SURGICAL SUPPLY — 29 items
BANDAGE ACE 6X5 VEL STRL LF (GAUZE/BANDAGES/DRESSINGS) ×3 IMPLANT
BLADE 4.2CUDA (BLADE) ×3 IMPLANT
BNDG ELASTIC 6X10 VLCR STRL LF (GAUZE/BANDAGES/DRESSINGS) ×3 IMPLANT
COVER SURGICAL LIGHT HANDLE (MISCELLANEOUS) ×3 IMPLANT
CUFF TOURN SGL QUICK 34 (TOURNIQUET CUFF) ×2
CUFF TRNQT CYL 34X4X40X1 (TOURNIQUET CUFF) ×1 IMPLANT
DRAPE U-SHAPE 47X51 STRL (DRAPES) ×3 IMPLANT
DRSG EMULSION OIL 3X3 NADH (GAUZE/BANDAGES/DRESSINGS) ×3 IMPLANT
DRSG PAD ABDOMINAL 8X10 ST (GAUZE/BANDAGES/DRESSINGS) ×3 IMPLANT
DURAPREP 26ML APPLICATOR (WOUND CARE) ×3 IMPLANT
GAUZE SPONGE 4X4 12PLY STRL (GAUZE/BANDAGES/DRESSINGS) ×3 IMPLANT
GLOVE BIO SURGEON STRL SZ8 (GLOVE) ×3 IMPLANT
GLOVE BIOGEL PI IND STRL 8 (GLOVE) ×1 IMPLANT
GLOVE BIOGEL PI INDICATOR 8 (GLOVE) ×2
GOWN STRL REUS W/TWL LRG LVL3 (GOWN DISPOSABLE) ×3 IMPLANT
KIT BASIN OR (CUSTOM PROCEDURE TRAY) ×3 IMPLANT
MANIFOLD NEPTUNE II (INSTRUMENTS) ×3 IMPLANT
MARKER SKIN DUAL TIP RULER LAB (MISCELLANEOUS) ×3 IMPLANT
PACK ARTHROSCOPY WL (CUSTOM PROCEDURE TRAY) ×3 IMPLANT
PACK ICE MAXI GEL EZY WRAP (MISCELLANEOUS) ×9 IMPLANT
PADDING CAST ABS 6INX4YD NS (CAST SUPPLIES) ×2
PADDING CAST ABS COTTON 6X4 NS (CAST SUPPLIES) ×1 IMPLANT
PADDING CAST COTTON 6X4 STRL (CAST SUPPLIES) ×6 IMPLANT
POSITIONER SURGICAL ARM (MISCELLANEOUS) ×3 IMPLANT
SUT ETHILON 4 0 PS 2 18 (SUTURE) ×3 IMPLANT
TOWEL OR 17X26 10 PK STRL BLUE (TOWEL DISPOSABLE) ×3 IMPLANT
TUBING ARTHRO INFLOW-ONLY STRL (TUBING) ×3 IMPLANT
WAND HAND CNTRL MULTIVAC 90 (MISCELLANEOUS) ×3 IMPLANT
WRAP KNEE MAXI GEL POST OP (GAUZE/BANDAGES/DRESSINGS) ×3 IMPLANT

## 2016-02-26 NOTE — Anesthesia Procedure Notes (Signed)
Procedure Name: LMA Insertion Date/Time: 02/26/2016 1:56 PM Performed by: Talbot Grumbling Pre-anesthesia Checklist: Patient identified, Emergency Drugs available, Suction available and Patient being monitored Patient Re-evaluated:Patient Re-evaluated prior to inductionOxygen Delivery Method: Circle system utilized Preoxygenation: Pre-oxygenation with 100% oxygen Intubation Type: IV induction Ventilation: Mask ventilation without difficulty LMA: LMA inserted LMA Size: 4.0 Number of attempts: 1 Placement Confirmation: positive ETCO2 and breath sounds checked- equal and bilateral Tube secured with: Tape Dental Injury: Teeth and Oropharynx as per pre-operative assessment

## 2016-02-26 NOTE — Anesthesia Postprocedure Evaluation (Signed)
Anesthesia Post Note  Patient: Jillian [name]  Procedure(s) Performed: Procedure(s) (LRB): ARTHROSCOPY RIGHT KNEE WITH MENSICAL DEBRIDEMENT (Right)  Patient location during evaluation: PACU Anesthesia Type: General Level of consciousness: awake Pain management: pain level controlled Vital Signs Assessment: post-procedure vital signs reviewed and stable Respiratory status: spontaneous breathing Cardiovascular status: stable Anesthetic complications: no    Last Vitals:  Filed Vitals:   02/26/16 1108 02/26/16 1450  BP: 147/66 139/65  Pulse: 74 92  Temp: 36.4 C   Resp: 18     Last Pain:  Filed Vitals:   02/26/16 1457  PainSc: 5                  EDWARDS,Wyeth Hoffer

## 2016-02-26 NOTE — Discharge Instructions (Signed)
° °Dr. Frank Aluisio °[facility] Specialist °[facility] °[address] °[place], [place] [address] °[phone] ° ° °Arthroscopic Procedure, Knee °An arthroscopic procedure can find what is wrong with your knee. °PROCEDURE °Arthroscopy is a surgical technique that allows your orthopedic surgeon to diagnose and treat your knee injury with accuracy. They will look into your knee through a small instrument. This is almost like a small (pencil sized) telescope. Because arthroscopy affects your knee less than open knee surgery, you can anticipate a more rapid recovery. Taking an active role by following your caregiver's instructions will help with rapid and complete recovery. Use crutches, rest, elevation, ice, and knee exercises as instructed. The length of recovery depends on various factors including type of injury, age, physical condition, medical conditions, and your rehabilitation. °Your knee is the joint between the large bones (femur and tibia) in your leg. Cartilage covers these bone ends which are smooth and slippery and allow your knee to bend and move smoothly. Two menisci, thick, semi-lunar shaped pads of cartilage which form a rim inside the joint, help absorb shock and stabilize your knee. Ligaments bind the bones together and support your knee joint. Muscles move the joint, help support your knee, and take stress off the joint itself. Because of this all programs and physical therapy to rehabilitate an injured or repaired knee require rebuilding and strengthening your muscles. °AFTER THE PROCEDURE °· After the procedure, you will be moved to a recovery area until most of the effects of the medication have worn off. Your caregiver will discuss the test results with you.  °· Only take over-the-counter or prescription medicines for pain, discomfort, or fever as directed by your caregiver.  °SEEK MEDICAL CARE IF:  °· You have increased bleeding from your wounds.  °· You see  redness, swelling, or have increasing pain in your wounds.  °· You have pus coming from your wound.  °· You have an oral temperature above 102° F (38.9° C).  °· You notice a bad smell coming from the wound or dressing.  °· You have severe pain with any motion of your knee.  °SEEK IMMEDIATE MEDICAL CARE IF:  °· You develop a rash.  °· You have difficulty breathing.  °· You have any allergic problems.  °FURTHER INSTRUCTIONS:  °· ICE to the affected knee every three hours for 30 minutes at a time and then as needed for pain and swelling.  Continue to use ice on the knee for pain and swelling from surgery. You may notice swelling that will progress down to the foot and ankle.  This is normal after surgery.  Elevate the leg when you are not up walking on it.   ° °DIET °You may resume your previous home diet once your are discharged from the hospital. ° °DRESSING / WOUND CARE / SHOWERING °You may start showering two days after being discharged home but do not submerge the incisions under water.  °Change dressing 48 hours after the procedure and then cover the small incisions with band aids until your follow up visit. °Change the surgical dressings daily and reapply a dry dressing each time.  ° °ACTIVITY °Walk with your walker as instructed. °Use walker as long as suggested by your caregivers. °Avoid periods of inactivity such as sitting longer than an hour when not asleep. This helps prevent blood clots.  °You may resume a sexual relationship in one month or when given the OK by your doctor.  °You may return to   work once you are cleared by your doctor.  °Do not drive a car for 6 weeks or until released by you surgeon.  °Do not drive while taking narcotics. ° °WEIGHT BEARING AS TOLERATED ° °POSTOPERATIVE CONSTIPATION PROTOCOL °Constipation - defined medically as fewer than three stools per week and severe constipation as less than one stool per week. ° °One of the most common issues patients have following surgery is  constipation.  Even if you have a regular bowel pattern at home, your normal regimen is likely to be disrupted due to multiple reasons following surgery.  Combination of anesthesia, postoperative narcotics, change in appetite and fluid intake all can affect your bowels.  In order to avoid complications following surgery, here are some recommendations in order to help you during your recovery period. ° °Colace (docusate) - Pick up an over-the-counter form of Colace or another stool softener and take twice a day as long as you are requiring postoperative pain medications.  Take with a full glass of water daily.  If you experience loose stools or diarrhea, hold the colace until you stool forms back up.  If your symptoms do not get better within 1 week or if they get worse, check with your doctor. ° °Dulcolax (bisacodyl) - Pick up over-the-counter and take as directed by the product packaging as needed to assist with the movement of your bowels.  Take with a full glass of water.  Use this product as needed if not relieved by Colace only.  ° °MiraLax (polyethylene glycol) - Pick up over-the-counter to have on hand.  MiraLax is a solution that will increase the amount of water in your bowels to assist with bowel movements.  Take as directed and can mix with a glass of water, juice, soda, coffee, or tea.  Take if you go more than two days without a movement. °Do not use MiraLax more than once per day. Call your doctor if you are still constipated or irregular after using this medication for 7 days in a row. ° °If you continue to have problems with postoperative constipation, please contact the office for further assistance and recommendations.  If you experience "the worst abdominal pain ever" or develop nausea or vomiting, please contact the office immediatly for further recommendations for treatment. ° °ITCHING ° If you experience itching with your medications, try taking only a single pain pill, or even half a pain pill  at a time.  You can also use Benadryl over the counter for itching or also to help with sleep.  ° °TED HOSE STOCKINGS °Wear the elastic stockings on both legs for three weeks following surgery during the day but you may remove then at night for sleeping. ° °MEDICATIONS °See your medication summary on the “After Visit Summary” that the nursing staff will review with you prior to discharge.  You may have some home medications which will be placed on hold until you complete the course of blood thinner medication.  It is important for you to complete the blood thinner medication as prescribed by your surgeon.  Continue your approved medications as instructed at time of discharge. °Do not drive while taking narcotics.  ° °PRECAUTIONS °If you experience chest pain or shortness of breath - call 911 immediately for transfer to the hospital emergency department.  °If you develop a fever greater that 101 F, purulent drainage from wound, increased redness or drainage from wound, foul odor from the wound/dressing, or calf pain - CONTACT YOUR SURGEON.   °                                                °  FOLLOW-UP APPOINTMENTS Make sure you keep all of your appointments after your operation with your surgeon and caregivers. You should call the office at [phone]  and make an appointment for approximately one week after the date of your surgery or on the date instructed by your surgeon outlined in the "After Visit Summary".  RANGE OF MOTION AND STRENGTHENING EXERCISES  Rehabilitation of the knee is important following a knee injury or an operation. After just a few days of immobilization, the muscles of the thigh which control the knee become weakened and shrink (atrophy). Knee exercises are designed to build up the tone and strength of the thigh muscles and to improve knee motion. Often times heat used for twenty to thirty minutes before working out will loosen up your tissues and help with improving the range of motion  but do not use heat for the first two weeks following surgery. These exercises can be done on a training (exercise) mat, on the floor, on a table or on a bed. Use what ever works the best and is most comfortable for you Knee exercises include:  QUAD STRENGTHENING EXERCISES Strengthening Quadriceps Sets  Tighten muscles on top of thigh by pushing knees down into floor or table. Hold for 20 seconds. Repeat 10 times. Do 2 sessions per day.     Strengthening Terminal Knee Extension  With knee bent over bolster, straighten knee by tightening muscle on top of thigh. Be sure to keep bottom of knee on bolster. Hold for 20 seconds. Repeat 10 times. Do 2 sessions per day.   Straight Leg with Bent Knee  Lie on back with opposite leg bent. Keep involved knee slightly bent at knee and raise leg 4-6". Hold for 10 seconds. Repeat 20 times per set. Do 2 sets per session. Do 2 sessions per day.     General Anesthesia, Adult, Care After Refer to this sheet in the next few weeks. These instructions provide you with information on caring for yourself after your procedure. Your health care provider may also give you more specific instructions. Your treatment has been planned according to current medical practices, but problems sometimes occur. Call your health care provider if you have any problems or questions after your procedure. WHAT TO EXPECT AFTER THE PROCEDURE After the procedure, it is typical to experience:  Sleepiness.  Nausea and vomiting. HOME CARE INSTRUCTIONS  For the first 24 hours after general anesthesia:  Have a responsible person with you.  Do not drive a car. If you are alone, do not take public transportation.  Do not drink alcohol.  Do not take medicine that has not been prescribed by your health care provider.  Do not sign important papers or make important decisions.  You may resume a normal diet and activities as directed by your health care provider.  Change  bandages (dressings) as directed.  If you have questions or problems that seem related to general anesthesia, call the hospital and ask for the anesthetist or anesthesiologist on call. SEEK MEDICAL CARE IF:  You have nausea and vomiting that continue the day after anesthesia.  You develop a rash. SEEK IMMEDIATE MEDICAL CARE IF:   You have difficulty breathing.  You have chest pain.  You have any allergic problems.   This information is not intended to replace advice given to you by your health care provider. Make sure you discuss any questions you have with your health care provider.   Document Released: 12/15/2000 Document Revised: 09/29/2014 Document Reviewed:  01/07/2012 Elsevier Interactive Patient Education [employer].

## 2016-02-26 NOTE — Anesthesia Preprocedure Evaluation (Addendum)
Anesthesia Evaluation  Patient identified by MRN, date of birth, ID band Patient awake    Reviewed: Allergy & Precautions, NPO status , Patient's Chart, lab work & pertinent test results  Airway Mallampati: II  TM Distance: >3 FB Neck ROM: Full    Dental   Pulmonary neg pulmonary ROS,    breath sounds clear to auscultation       Cardiovascular hypertension,  Rhythm:Regular Rate:Normal     Neuro/Psych    GI/Hepatic negative GI ROS, Neg liver ROS,   Endo/Other  diabetes  Renal/GU Renal disease     Musculoskeletal   Abdominal   Peds  Hematology   Anesthesia Other Findings   Reproductive/Obstetrics                            Anesthesia Physical Anesthesia Plan  ASA: III  Anesthesia Plan: General   Post-op Pain Management:    Induction: Intravenous  Airway Management Planned: LMA  Additional Equipment:   Intra-op Plan:   Post-operative Plan: Extubation in OR  Informed Consent: I have reviewed the patients History and Physical, chart, labs and discussed the procedure including the risks, benefits and alternatives for the proposed anesthesia with the patient or authorized representative who has indicated his/her understanding and acceptance.   Dental advisory given  Plan Discussed with: CRNA and Anesthesiologist  Anesthesia Plan Comments:         Anesthesia Quick Evaluation

## 2016-02-26 NOTE — H&P (Signed)
CC- Jillian [name] is a 75 y.o. female who presents with right knee pain.  HPI- . Knee Pain: Patient presents with knee pain involving the  right knee. Onset of the symptoms was several months ago. Inciting event: none known. Current symptoms include giving out, locking and pain located medially. Pain is aggravated by lateral movements, pivoting, rising after sitting and walking.  Patient has had no prior knee problems. Evaluation to date: MRI: abnormal medial meniscal tear. Treatment to date: corticosteroid injection which was not very effective.  Past Medical History  Diagnosis Date  . Dyslipidemia   . Mild obesity   . Postmenopausal   . DJD (degenerative joint disease)     of neck    . Chronic fatigue   . Diabetes mellitus 2011  . Hypertension 2005  . Wears glasses     Past Surgical History  Procedure Laterality Date  . Left breast biopsy    . Appendectomy    . Lesion removal Right 03/30/2013    Procedure: EXCISION OF NEOPLASM ARM;  Surgeon: Jamesetta So, MD;  Location: [facility];  Service: General;  Laterality: Right;  . Cataract Bilateral   . Cyst excision Right     arm  . Eye surgery Right 06/07/2013    cataract  . Eye surgery Left 09/14/2013    cataract  . Abdominal hysterectomy  1992    fibroids  . Breast lumpectomy with radioactive seed localization Right 10/05/2014    Procedure: BREAST LUMPECTOMY WITH RADIOACTIVE SEED LOCALIZATION;  Surgeon: Erroll Luna, MD;  Location: [place];  Service: General;  Laterality: Right;  . Tubal ligation      Prior to Admission medications   Medication Sig Start Date End Date Taking? Authorizing Provider  aspirin EC 81 MG tablet Take 81 mg by mouth every evening.    Yes Historical Provider, MD  benazepril (LOTENSIN) 10 MG tablet Take 1 tablet (10 mg total) by mouth daily. 01/10/16  Yes Fayrene Helper, MD  Calcium Carbonate (CALCIUM 600 PO) Take 1 tablet by mouth daily.   Yes Historical Provider, MD  cloNIDine  (CATAPRES) 0.1 MG tablet TAKE 1 TABLET AT BEDTIME 01/31/16  Yes Fayrene Helper, MD  HYDROcodone-acetaminophen (NORCO/VICODIN) 5-325 MG tablet Take 1 tablet by mouth every 6 (six) hours as needed. 02/17/16  Yes Milton Ferguson, MD  metaxalone (SKELAXIN) 800 MG tablet Take 800 mg by mouth 4 (four) times daily.   Yes Historical Provider, MD  metFORMIN (GLUCOPHAGE-XR) 500 MG 24 hr tablet TAKE 1 TABLET TWICE DAILY 01/31/16  Yes Fayrene Helper, MD  OVER THE COUNTER MEDICATION Take 1 capsule by mouth daily. Liver Cleanse Formula   Yes Historical Provider, MD  OVER THE COUNTER MEDICATION Take 1 capsule by mouth daily. Estrotone   Yes Historical Provider, MD  pravastatin (PRAVACHOL) 10 MG tablet Take 10 mg by mouth daily. 12/27/15  Yes Historical Provider, MD  ibuprofen (ADVIL,MOTRIN) 800 MG tablet Take 1 tablet (800 mg total) by mouth every 8 (eight) hours as needed. Patient taking differently: Take 800 mg by mouth every 8 (eight) hours as needed (For pain.).  12/26/15   Fayrene Helper, MD  traMADol (ULTRAM) 50 MG tablet Take 1 tablet (50 mg total) by mouth every 6 (six) hours as needed. 02/17/16   Milton Ferguson, MD   KNEE EXAM antalgic gait, soft tissue tenderness over medial joint line, no effusion, negative drawer sign, collateral ligaments intact  Physical Examination: General appearance - alert, well appearing,  and in no distress Mental status - alert, oriented to person, place, and time Chest - clear to auscultation, no wheezes, rales or rhonchi, symmetric air entry Heart - normal rate, regular rhythm, normal S1, S2, no murmurs, rubs, clicks or gallops Abdomen - soft, nontender, nondistended, no masses or organomegaly Neurological - alert, oriented, normal speech, no focal findings or movement disorder noted    Asessment/Plan--- Right knee medial meniscal tear- - Plan right knee arthroscopy with meniscal debridement. Procedure risks and potential comps discussed with patient who elects to  proceed. Goals are decreased pain and increased function with a high likelihood of achieving both

## 2016-02-26 NOTE — Interval H&P Note (Signed)
History and Physical Interval Note:  02/26/2016 1:48 PM  Jillian [name]  has presented today for surgery, with the diagnosis of right knee medial meniscal tear  The various methods of treatment have been discussed with the patient and family. After consideration of risks, benefits and other options for treatment, the patient has consented to  Procedure(s): ARTHROSCOPY RIGHT KNEE WITH MENSICAL DEBRIDEMENT (Right) as a surgical intervention .  The patient's history has been reviewed, patient examined, no change in status, stable for surgery.  I have reviewed the patient's chart and labs.  Questions were answered to the patient's satisfaction.     Gearlean Alf

## 2016-02-26 NOTE — Transfer of Care (Signed)
Immediate Anesthesia Transfer of Care Note  Patient: Jillian [name]  Procedure(s) Performed: Procedure(s): ARTHROSCOPY RIGHT KNEE WITH MENSICAL DEBRIDEMENT (Right)  Patient Location: PACU  Anesthesia Type:General  Level of Consciousness:  sedated, patient cooperative and responds to stimulation  Airway & Oxygen Therapy:Patient Spontanous Breathing and Patient connected to face mask oxgen  Post-op Assessment:  Report given to PACU RN and Post -op Vital signs reviewed and stable  Post vital signs:  Reviewed and stable  Last Vitals:  Filed Vitals:   02/26/16 1108  BP: 147/66  Pulse: 74  Temp: 36.4 C  Resp: 18    Complications: No apparent anesthesia complications

## 2016-02-26 NOTE — Op Note (Signed)
Preoperative diagnosis-  Right knee medial meniscal tear  Postoperative diagnosis Right- knee medial meniscal tear plus  Medial femoral chondral defect  Procedure- Right knee arthroscopy with medial  meniscal debridement and chondroplasty   Surgeon- Dione Plover. Jhon Mallozzi, MD  Anesthesia-General  EBL-  Minimal  Complications- None  Condition- PACU - hemodynamically stable.  Brief clinical note- -Jillian [name] is a 75 y.o.  female with a several month history of right knee pain and mechanical symptoms. Exam and history suggested medial meniscal tear confirmed by MRI. The patient presents now for arthroscopy and debridement  Procedure in detail -       After successful administration of General anesthetic, a tourmiquet is placed high on the Right  thigh and the Right lower extremity is prepped and draped in the usual sterile fashion. Time out is performed by the surgical team. Standard superomedial and inferolateral portal sites are marked and incisions made with an 11 blade. The inflow cannula is passed through the superomedial portal and camera through the inferolateral portal and inflow is initiated. Arthroscopic visualization proceeds.      The undersurface of the patella and trochlea are visualized and there is mild chondromalacia but no unstable chondral defects.. The medial and lateral gutters are visualized and there are  no loose bodies. Flexion and valgus force is applied to the knee and the medial compartment is entered. A spinal needle is passed into the joint through the site marked for the inferomedial portal. A small incision is made and the dilator passed into the joint. The findings for the medial compartment are unstable tear of body and posterior horn of medial mensicus with flap component and 2 x 2 cm chondral defect medial femoral condyle. . The tear is debrided to a stable base with baskets and a shaver and sealed off with the Arthrocare. The shaver is used to debride the  unstable cartilage to a stable bony base with stable edges. It is probed and found to be stable. The exposed bone is abraded with the shaver.    The intercondylar notch is visualized and the ACL appears normal. The lateral compartment is entered and the findings are normal .      The joint is again inspected and there are no other tears, defects or loose bodies identified. The arthroscopic equipment is then removed from the inferior portals which are closed with interrupted 4-0 nylon. 20 ml of .25% Marcaine with epinephrine are injected through the inflow cannula and the cannula is then removed and the portal closed with nylon. The incisions are cleaned and dried and a bulky sterile dressing is applied. The patient is then awakened and transported to recovery in stable condition.   02/26/2016, 2:40 PM

## 2016-02-27 ENCOUNTER — Encounter (HOSPITAL_COMMUNITY): Payer: Self-pay | Admitting: Orthopedic Surgery

## 2016-03-31 ENCOUNTER — Emergency Department (HOSPITAL_COMMUNITY): Payer: Commercial Managed Care - HMO

## 2016-03-31 ENCOUNTER — Emergency Department (HOSPITAL_COMMUNITY)
Admission: EM | Admit: 2016-03-31 | Discharge: 2016-04-01 | Disposition: A | Discharge status: Home / Self Care | Payer: Commercial Managed Care - HMO | Attending: Emergency Medicine | Admitting: Emergency Medicine

## 2016-03-31 ENCOUNTER — Encounter (HOSPITAL_COMMUNITY): Payer: Self-pay | Admitting: Emergency Medicine

## 2016-03-31 DIAGNOSIS — E669 Obesity, unspecified: Secondary | ICD-10-CM | POA: Diagnosis not present

## 2016-03-31 DIAGNOSIS — Z6831 Body mass index (BMI) 31.0-31.9, adult: Secondary | ICD-10-CM | POA: Diagnosis not present

## 2016-03-31 DIAGNOSIS — Z79899 Other long term (current) drug therapy: Secondary | ICD-10-CM | POA: Insufficient documentation

## 2016-03-31 DIAGNOSIS — K5732 Diverticulitis of large intestine without perforation or abscess without bleeding: Secondary | ICD-10-CM

## 2016-03-31 DIAGNOSIS — K5730 Diverticulosis of large intestine without perforation or abscess without bleeding: Secondary | ICD-10-CM | POA: Diagnosis not present

## 2016-03-31 DIAGNOSIS — I10 Essential (primary) hypertension: Secondary | ICD-10-CM | POA: Diagnosis not present

## 2016-03-31 DIAGNOSIS — Z79891 Long term (current) use of opiate analgesic: Secondary | ICD-10-CM | POA: Insufficient documentation

## 2016-03-31 DIAGNOSIS — R1084 Generalized abdominal pain: Secondary | ICD-10-CM | POA: Diagnosis present

## 2016-03-31 DIAGNOSIS — E785 Hyperlipidemia, unspecified: Secondary | ICD-10-CM | POA: Diagnosis not present

## 2016-03-31 DIAGNOSIS — Z7982 Long term (current) use of aspirin: Secondary | ICD-10-CM | POA: Insufficient documentation

## 2016-03-31 DIAGNOSIS — E119 Type 2 diabetes mellitus without complications: Secondary | ICD-10-CM | POA: Diagnosis not present

## 2016-03-31 LAB — CBC WITH DIFFERENTIAL/PLATELET
Basophils Absolute: 0 10*3/uL (ref 0.0–0.1)
Basophils Relative: 0 %
EOS PCT: 0 %
Eosinophils Absolute: 0 10*3/uL (ref 0.0–0.7)
HCT: 38.7 % (ref 36.0–46.0)
Hemoglobin: 12.3 g/dL (ref 12.0–15.0)
LYMPHS ABS: 2.5 10*3/uL (ref 0.7–4.0)
LYMPHS PCT: 28 %
MCH: 29.7 pg (ref 26.0–34.0)
MCHC: 31.8 g/dL (ref 30.0–36.0)
MCV: 93.5 fL (ref 78.0–100.0)
MONO ABS: 0.6 10*3/uL (ref 0.1–1.0)
MONOS PCT: 6 %
Neutro Abs: 6 10*3/uL (ref 1.7–7.7)
Neutrophils Relative %: 66 %
PLATELETS: 183 10*3/uL (ref 150–400)
RBC: 4.14 MIL/uL (ref 3.87–5.11)
RDW: 13.7 % (ref 11.5–15.5)
WBC: 9.2 10*3/uL (ref 4.0–10.5)

## 2016-03-31 LAB — URINALYSIS, ROUTINE W REFLEX MICROSCOPIC
BILIRUBIN URINE: NEGATIVE
Glucose, UA: NEGATIVE mg/dL
KETONES UR: NEGATIVE mg/dL
Leukocytes, UA: NEGATIVE
Nitrite: NEGATIVE
PROTEIN: NEGATIVE mg/dL
Specific Gravity, Urine: 1.02 (ref 1.005–1.030)
pH: 5.5 (ref 5.0–8.0)

## 2016-03-31 LAB — COMPREHENSIVE METABOLIC PANEL
ALT: 15 U/L (ref 14–54)
AST: 21 U/L (ref 15–41)
Albumin: 4.1 g/dL (ref 3.5–5.0)
Alkaline Phosphatase: 59 U/L (ref 38–126)
Anion gap: 5 (ref 5–15)
BILIRUBIN TOTAL: 0.4 mg/dL (ref 0.3–1.2)
BUN: 11 mg/dL (ref 6–20)
CO2: 27 mmol/L (ref 22–32)
CREATININE: 0.86 mg/dL (ref 0.44–1.00)
Calcium: 9 mg/dL (ref 8.9–10.3)
Chloride: 107 mmol/L (ref 101–111)
Glucose, Bld: 112 mg/dL — ABNORMAL HIGH (ref 65–99)
Potassium: 3.5 mmol/L (ref 3.5–5.1)
Sodium: 139 mmol/L (ref 135–145)
TOTAL PROTEIN: 7.7 g/dL (ref 6.5–8.1)

## 2016-03-31 LAB — URINE MICROSCOPIC-ADD ON

## 2016-03-31 LAB — LIPASE, BLOOD: Lipase: 15 U/L (ref 11–51)

## 2016-03-31 MED ORDER — ONDANSETRON 4 MG PO TBDP: ORAL_TABLET | ORAL | Status: DC

## 2016-03-31 MED ORDER — METRONIDAZOLE 500 MG PO TABS
500.0000 mg | ORAL_TABLET | Freq: Four times a day (QID) | ORAL | Status: DC
Start: 2016-03-31 — End: 2016-04-02

## 2016-03-31 MED ORDER — SODIUM CHLORIDE 0.9 % IV BOLUS (SEPSIS)
1000.0000 mL | Freq: Once | INTRAVENOUS | Status: AC
  Administered 2016-03-31: 1000 mL via INTRAVENOUS

## 2016-03-31 MED ORDER — DIATRIZOATE MEGLUMINE & SODIUM 66-10 % PO SOLN
ORAL | Status: AC
  Administered 2016-03-31: 20:00:00
  Filled 2016-03-31: qty 30

## 2016-03-31 MED ORDER — CIPROFLOXACIN IN D5W 400 MG/200ML IV SOLN
400.0000 mg | Freq: Once | INTRAVENOUS | Status: AC
  Administered 2016-03-31: 400 mg via INTRAVENOUS
  Filled 2016-03-31: qty 200

## 2016-03-31 MED ORDER — METRONIDAZOLE IN NACL 5-0.79 MG/ML-% IV SOLN
500.0000 mg | Freq: Once | INTRAVENOUS | Status: AC
Start: 2016-03-31 — End: 2016-04-01
  Administered 2016-04-01: 500 mg via INTRAVENOUS
  Filled 2016-03-31: qty 100

## 2016-03-31 MED ORDER — HYDROCODONE-ACETAMINOPHEN 5-325 MG PO TABS: 1.0000 | ORAL_TABLET | Freq: Four times a day (QID) | ORAL | Status: DC | PRN

## 2016-03-31 MED ORDER — CIPROFLOXACIN HCL 500 MG PO TABS: 500.0000 mg | ORAL_TABLET | Freq: Two times a day (BID) | ORAL | Status: DC

## 2016-03-31 MED ORDER — IOPAMIDOL (ISOVUE-300) INJECTION 61%
100.0000 mL | Freq: Once | INTRAVENOUS | Status: AC | PRN
  Administered 2016-03-31: 100 mL via INTRAVENOUS

## 2016-03-31 NOTE — ED Provider Notes (Signed)
CSN: [id number]     Arrival date & time 03/31/16  46 History   First MD Initiated Contact with Patient 03/31/16 1920     Chief Complaint  Patient presents with  . Abdominal Pain     (Consider location/radiation/quality/duration/timing/severity/associated sxs/prior Treatment) Patient is a 75 y.o. female presenting with abdominal pain. The history is provided by the patient (Patient complains of abdominal pain throughout her abdomen but worse in her lower abdomen).  Abdominal Pain Pain location:  Generalized Pain quality: aching   Pain radiates to:  Does not radiate Pain severity:  Moderate Onset quality:  Sudden Timing:  Constant Progression:  Waxing and waning Chronicity:  New Associated symptoms: no chest pain, no cough, no diarrhea, no fatigue and no hematuria     Past Medical History  Diagnosis Date  . Dyslipidemia   . Mild obesity   . Postmenopausal   . DJD (degenerative joint disease)     of neck    . Chronic fatigue   . Diabetes mellitus 2011  . Hypertension 2005  . Wears glasses    Past Surgical History  Procedure Laterality Date  . Left breast biopsy    . Appendectomy    . Lesion removal Right 03/30/2013    Procedure: EXCISION OF NEOPLASM ARM;  Surgeon: Jamesetta So, MD;  Location: [facility];  Service: General;  Laterality: Right;  . Cataract Bilateral   . Cyst excision Right     arm  . Eye surgery Right 06/07/2013    cataract  . Eye surgery Left 09/14/2013    cataract  . Abdominal hysterectomy  1992    fibroids  . Breast lumpectomy with radioactive seed localization Right 10/05/2014    Procedure: BREAST LUMPECTOMY WITH RADIOACTIVE SEED LOCALIZATION;  Surgeon: Erroll Luna, MD;  Location: [facility];  Service: General;  Laterality: Right;  . Tubal ligation    . Knee arthroscopy Right 02/26/2016    Procedure: ARTHROSCOPY RIGHT KNEE WITH MENSICAL DEBRIDEMENT;  Surgeon: Gaynelle Arabian, MD;  Location: [facility];  Service: Orthopedics;  Laterality:  Right;   Family History  Problem Relation Age of Onset  . Heart failure Father   . Hypertension Father   . Cancer Mother     liver cancer  . Hypertension Sister   . Hypertension Brother   . Diabetes Brother    Social History  Substance Use Topics  . Smoking status: Never Smoker   . Smokeless tobacco: Never Used  . Alcohol Use: No   OB History    No data available     Review of Systems  Constitutional: Negative for appetite change and fatigue.  HENT: Negative for congestion, ear discharge and sinus pressure.   Eyes: Negative for discharge.  Respiratory: Negative for cough.   Cardiovascular: Negative for chest pain.  Gastrointestinal: Positive for abdominal pain. Negative for diarrhea.  Genitourinary: Negative for frequency and hematuria.  Musculoskeletal: Negative for back pain.  Skin: Negative for rash.  Neurological: Negative for seizures and headaches.  Psychiatric/Behavioral: Negative for hallucinations.      Allergies  Codeine and Propoxyphene n-acetaminophen  Home Medications   Prior to Admission medications   Medication Sig Start Date End Date Taking? Authorizing Provider  acetaminophen (TYLENOL) 500 MG tablet Take 1,000 mg by mouth every 6 (six) hours as needed for mild pain.   Yes Historical Provider, MD  aspirin EC 81 MG tablet Take 81 mg by mouth every evening.    Yes Historical Provider, MD  benazepril (LOTENSIN)  10 MG tablet Take 1 tablet (10 mg total) by mouth daily. Patient taking differently: Take 10 mg by mouth at bedtime.  01/10/16  Yes Fayrene Helper, MD  Calcium Carbonate-Vitamin D (OS-CAL 250 + D PO) Take 1 tablet by mouth daily.   Yes Historical Provider, MD  cloNIDine (CATAPRES) 0.1 MG tablet TAKE 1 TABLET AT BEDTIME 01/31/16  Yes Fayrene Helper, MD  HYDROcodone-acetaminophen (NORCO/VICODIN) 5-325 MG tablet Take 1-2 tablets by mouth every 4 (four) hours as needed for moderate pain or severe pain. 02/26/16  Yes Gaynelle Arabian, MD  metFORMIN  (GLUCOPHAGE-XR) 500 MG 24 hr tablet TAKE 1 TABLET TWICE DAILY Patient taking differently: TAKE TWO TABLETS BY MOUTH ONCE DAILY AT BEDTIME 01/31/16  Yes Fayrene Helper, MD  OVER THE COUNTER MEDICATION Take 1 capsule by mouth at bedtime. Liver Cleanse Formula   Yes Historical Provider, MD  pravastatin (PRAVACHOL) 10 MG tablet Take 10 mg by mouth at bedtime.  12/27/15  Yes Historical Provider, MD  tamoxifen (NOLVADEX) 20 MG tablet Take 20 mg by mouth at bedtime.  01/29/16  Yes Historical Provider, MD  ibuprofen (ADVIL,MOTRIN) 800 MG tablet Take 1 tablet (800 mg total) by mouth every 8 (eight) hours as needed (For pain.). Patient not taking: Reported on 03/31/2016 02/26/16   Gaynelle Arabian, MD  methocarbamol (ROBAXIN) 500 MG tablet Take 1 tablet (500 mg total) by mouth 4 (four) times daily. As needed for muscle spasm Patient not taking: Reported on 03/31/2016 02/26/16   Gaynelle Arabian, MD  traMADol (ULTRAM) 50 MG tablet Take 1 tablet (50 mg total) by mouth every 6 (six) hours as needed. Patient not taking: Reported on 03/31/2016 02/17/16   Milton Ferguson, MD   BP 125/97 mmHg  Pulse 82  Temp(Src) 98.4 F (36.9 C) (Oral)  Resp 18  Ht 5\' 5"  (1.651 m)  Wt 192 lb 3.2 oz (87.181 kg)  BMI 31.98 kg/m2  SpO2 99% Physical Exam  Constitutional: She is oriented to person, place, and time. She appears well-developed.  HENT:  Head: Normocephalic.  Eyes: Conjunctivae and EOM are normal. No scleral icterus.  Neck: Neck supple. No thyromegaly present.  Cardiovascular: Normal rate and regular rhythm.  Exam reveals no gallop and no friction rub.   No murmur heard. Pulmonary/Chest: No stridor. She has no wheezes. She has no rales. She exhibits no tenderness.  Abdominal: She exhibits no distension. There is tenderness. There is no rebound.  Moderate tenderness throughout  Musculoskeletal: Normal range of motion. She exhibits no edema.  Lymphadenopathy:    She has no cervical adenopathy.  Neurological: She is oriented  to person, place, and time. She exhibits normal muscle tone. Coordination normal.  Skin: No rash noted. No erythema.  Psychiatric: She has a normal mood and affect. Her behavior is normal.    ED Course  Procedures (including critical care time) Labs Review Labs Reviewed  COMPREHENSIVE METABOLIC PANEL - Abnormal; Notable for the following:    Glucose, Bld 112 (*)    All other components within normal limits  URINALYSIS, ROUTINE W REFLEX MICROSCOPIC (NOT AT [facility]) - Abnormal; Notable for the following:    Hgb urine dipstick SMALL (*)    All other components within normal limits  URINE MICROSCOPIC-ADD ON - Abnormal; Notable for the following:    Squamous Epithelial / LPF 0-5 (*)    Bacteria, UA MANY (*)    All other components within normal limits  URINE CULTURE  LIPASE, BLOOD  CBC WITH DIFFERENTIAL/PLATELET  Imaging Review No results found. I have personally reviewed and evaluated these images and lab results as part of my medical decision-making.   EKG Interpretation None      MDM   Final diagnoses:  None     Patient with diverticulitis. She is nontoxic and was to go home she will be put on Flagyl and Cipro will follow-up with her doctor couple days   Milton Ferguson, MD 03/31/16 2155

## 2016-03-31 NOTE — ED Notes (Signed)
Pt placed for discharge but has 2 iv abx order that are hour long infusions per bag .  Will have to wait until iv medications are done infusing before discharge

## 2016-03-31 NOTE — Discharge Instructions (Signed)
Follow up with your md in 2 days °

## 2016-03-31 NOTE — ED Notes (Signed)
Patient ambulated to restroom.

## 2016-03-31 NOTE — ED Notes (Signed)
Pt states she has been having lower abdominal pain after eating with bloating since Saturday.

## 2016-04-01 NOTE — ED Notes (Signed)
Patient gait unsteady . Assisted to restroom with standby assist. Patient's family states that patient was having dry heaves. Reported to nurse taking care of her.

## 2016-04-02 ENCOUNTER — Other Ambulatory Visit (HOSPITAL_COMMUNITY)
Admission: RE | Admit: 2016-04-02 | Discharge: 2016-04-02 | Disposition: A | Discharge status: Home / Self Care | Payer: Commercial Managed Care - HMO | Source: Ambulatory Visit | Attending: Family Medicine | Admitting: Family Medicine

## 2016-04-02 ENCOUNTER — Encounter: Payer: Self-pay | Admitting: Family Medicine

## 2016-04-02 ENCOUNTER — Ambulatory Visit (INDEPENDENT_AMBULATORY_CARE_PROVIDER_SITE_OTHER): Payer: Commercial Managed Care - HMO | Admitting: Family Medicine

## 2016-04-02 VITALS — BP 122/80 | HR 84 | Resp 16 | Ht 64.0 in | Wt 194.0 lb

## 2016-04-02 DIAGNOSIS — M25561 Pain in right knee: Secondary | ICD-10-CM

## 2016-04-02 DIAGNOSIS — Z09 Encounter for follow-up examination after completed treatment for conditions other than malignant neoplasm: Secondary | ICD-10-CM | POA: Diagnosis not present

## 2016-04-02 DIAGNOSIS — E119 Type 2 diabetes mellitus without complications: Secondary | ICD-10-CM

## 2016-04-02 DIAGNOSIS — K5792 Diverticulitis of intestine, part unspecified, without perforation or abscess without bleeding: Secondary | ICD-10-CM | POA: Insufficient documentation

## 2016-04-02 DIAGNOSIS — G8929 Other chronic pain: Secondary | ICD-10-CM

## 2016-04-02 DIAGNOSIS — I10 Essential (primary) hypertension: Secondary | ICD-10-CM

## 2016-04-02 DIAGNOSIS — E669 Obesity, unspecified: Secondary | ICD-10-CM

## 2016-04-02 DIAGNOSIS — E1169 Type 2 diabetes mellitus with other specified complication: Secondary | ICD-10-CM

## 2016-04-02 LAB — URINE CULTURE: CULTURE: NO GROWTH

## 2016-04-02 MED ORDER — CIPROFLOXACIN HCL 500 MG PO TABS: 500.0000 mg | ORAL_TABLET | Freq: Two times a day (BID) | ORAL | Status: AC

## 2016-04-02 MED ORDER — METRONIDAZOLE 500 MG PO TABS: 500.0000 mg | ORAL_TABLET | Freq: Three times a day (TID) | ORAL | Status: AC

## 2016-04-02 MED ORDER — PROMETHAZINE HCL 25 MG PO TABS: 25.0000 mg | ORAL_TABLET | Freq: Three times a day (TID) | ORAL | Status: DC | PRN

## 2016-04-02 NOTE — Progress Notes (Signed)
   Jillian [name]     MRN: [id number]      DOB: 04/28/1941   HPI [name] Patient in for follow up of recent eD viisit one day ago, when she was diagnosed with acute diverticulitis (mild). Discharge summary, and laboratory and radiology data are reviewed, and any questions or concerns about recent hospitalization are discussed. Abdominal pain started 3 days prior to ED eval and worsened, she has not filled antibiotics prescribed yesterday due to prescription concern. Denies fever, chills, black stool or bloody stool, does have mild nausea, no vomiting Successfully had right knee arthroscopy and is healing well ROS Denies recent fever or chills. Denies sinus pressure, nasal congestion, ear pain or sore throat. Denies chest congestion, productive cough or wheezing. Denies chest pains, palpitations and leg swelling . Denies uncontrolled joint pain, swelling and limitation in mobility. Denies headaches, seizures, numbness, or tingling. Denies depression, anxiety or insomnia. Denies skin break down or rash.   PE  BP 122/80 mmHg  Pulse 84  Resp 16  Ht 5\' 4"  (1.626 m)  Wt 194 lb (87.998 kg)  BMI 33.28 kg/m2  SpO2 100%  Patient alert and oriented and in no cardiopulmonary distress.  HEENT: No facial asymmetry, EOMI,   oropharynx pink and moist.  Neck supple no JVD, no mass.  Chest: Clear to auscultation bilaterally.  CVS: S1, S2 no murmurs, no S3.Regular rate.  ABD: Soft mild LLQ tenderness, no guarding or rebound. Normal BS, no organomegaly or mass.   Ext: No edema  MS: Adequate ROM spine, shoulders, hips and knees.  Skin: Intact, no ulcerations or rash noted.  Psych: Good eye contact, normal affect. Memory intact not anxious or depressed appearing.  CNS: CN 2-12 intact, power,  normal throughout.no focal deficits noted.   Assessment & Plan Encounter for examination following treatment at hospital Improved symptomatically as far as acute diverticulitis is  concerned, has not started medication as incorrect dosing sent. Will re send Lab review and exam relatively benign, consider shorter antibiotic course  Acute diverticulitis of intestine 1 week course of flagyll and ciprofloxacin, and also phenergan prescribed  ESSENTIAL HYPERTENSION, BENIGN Controlled, no change in medication   Chronic knee pain Marked improvement following arthroscopy in June

## 2016-04-02 NOTE — Assessment & Plan Note (Signed)
Controlled, no change in medication  

## 2016-04-02 NOTE — Assessment & Plan Note (Signed)
Improved symptomatically as far as acute diverticulitis is concerned, has not started medication as incorrect dosing sent. Will re send Lab review and exam relatively benign, consider shorter antibiotic course

## 2016-04-02 NOTE — Assessment & Plan Note (Signed)
1 week course of flagyll and ciprofloxacin, and also phenergan prescribed

## 2016-04-02 NOTE — Assessment & Plan Note (Signed)
Marked improvement following arthroscopy in June

## 2016-04-02 NOTE — Patient Instructions (Addendum)
F/u as before, call if you need me sooner  Two antibiotics and one medication for nausea ar sent to treat your diverticulitis Take medication as prescribed  Thankful right knee is recovering well  Microalb today   Hope you continue to improve   Diverticulitis Diverticulitis is when small pockets that have formed in your colon (large intestine) become infected or swollen. HOME CARE  Follow your doctor's instructions.  Follow a special diet if told by your doctor.  When you feel better, your doctor may tell you to change your diet. You may be told to eat a lot of fiber. Fruits and vegetables are good sources of fiber. Fiber makes it easier to poop (have bowel movements).  Take supplements or probiotics as told by your doctor.  Only take medicines as told by your doctor.  Keep all follow-up visits with your doctor. GET HELP IF:  Your pain does not get better.  You have a hard time eating food.  You are not pooping like normal. GET HELP RIGHT AWAY IF:  Your pain gets worse.  Your problems do not get better.  Your problems suddenly get worse.  You have a fever.  You keep throwing up (vomiting).  You have bloody or black, tarry poop (stool). MAKE SURE YOU:   Understand these instructions.  Will watch your condition.  Will get help right away if you are not doing well or get worse.   This information is not intended to replace advice given to you by your health care provider. Make sure you discuss any questions you have with your health care provider.   Document Released: 02/25/2008 Document Revised: 09/13/2013 Document Reviewed: 08/03/2013 Elsevier Interactive Patient Education [employer].

## 2016-04-03 ENCOUNTER — Other Ambulatory Visit: Payer: Self-pay | Admitting: Family Medicine

## 2016-04-03 LAB — MICROALBUMIN, URINE: Microalb, Ur: 12.7 ug/mL — ABNORMAL HIGH

## 2016-04-19 ENCOUNTER — Emergency Department (HOSPITAL_COMMUNITY)
Admission: EM | Admit: 2016-04-19 | Discharge: 2016-04-19 | Disposition: A | Discharge status: Home / Self Care | Payer: Commercial Managed Care - HMO | Attending: Emergency Medicine | Admitting: Emergency Medicine

## 2016-04-19 ENCOUNTER — Emergency Department (HOSPITAL_COMMUNITY): Payer: Commercial Managed Care - HMO

## 2016-04-19 ENCOUNTER — Encounter (HOSPITAL_COMMUNITY): Payer: Self-pay

## 2016-04-19 DIAGNOSIS — I10 Essential (primary) hypertension: Secondary | ICD-10-CM | POA: Diagnosis not present

## 2016-04-19 DIAGNOSIS — Z7984 Long term (current) use of oral hypoglycemic drugs: Secondary | ICD-10-CM | POA: Insufficient documentation

## 2016-04-19 DIAGNOSIS — M549 Dorsalgia, unspecified: Secondary | ICD-10-CM | POA: Diagnosis present

## 2016-04-19 DIAGNOSIS — M546 Pain in thoracic spine: Secondary | ICD-10-CM | POA: Diagnosis not present

## 2016-04-19 DIAGNOSIS — M62830 Muscle spasm of back: Secondary | ICD-10-CM | POA: Insufficient documentation

## 2016-04-19 DIAGNOSIS — E119 Type 2 diabetes mellitus without complications: Secondary | ICD-10-CM | POA: Diagnosis not present

## 2016-04-19 DIAGNOSIS — Z7982 Long term (current) use of aspirin: Secondary | ICD-10-CM | POA: Insufficient documentation

## 2016-04-19 MED ORDER — METHOCARBAMOL 500 MG PO TABS
500.0000 mg | ORAL_TABLET | Freq: Once | ORAL | Status: AC
  Administered 2016-04-19: 500 mg via ORAL
  Filled 2016-04-19: qty 1

## 2016-04-19 MED ORDER — IBUPROFEN 800 MG PO TABS
800.0000 mg | ORAL_TABLET | Freq: Once | ORAL | Status: AC
  Administered 2016-04-19: 800 mg via ORAL
  Filled 2016-04-19: qty 1

## 2016-04-19 MED ORDER — IBUPROFEN 800 MG PO TABS: 800.0000 mg | ORAL_TABLET | Freq: Three times a day (TID) | ORAL | 0 refills | Status: DC

## 2016-04-19 MED ORDER — METHOCARBAMOL 500 MG PO TABS: 500.0000 mg | ORAL_TABLET | Freq: Two times a day (BID) | ORAL | 0 refills | Status: DC

## 2016-04-19 NOTE — ED Provider Notes (Signed)
[facility] DEPT Provider Note   CSN: [id number] Arrival date & time: 04/19/16  0800  First Provider Contact:  First MD Initiated Contact with Patient 04/19/16 0809     By signing my name below, I, Eustaquio Maize, attest that this documentation has been prepared under the direction and in the presence of Noemi Chapel, MD. Electronically Signed: Eustaquio Maize, ED Scribe. 04/19/16. 8:18 AM.   History   Chief Complaint Chief Complaint  Patient presents with  . Back Pain    HPI Jillian [name] is a 75 y.o. female who presents to the Emergency Department complaining of sudden onset, intermittent, non-radiating, midline upper back pain x 1 week, worsening yesterday. The pain is exacerbated with change in position. The pain is not present when pt is laying still. Pt has never had symptoms like this in the past. No recent injury to the back.  No previous back surgeries. Denies numbness, weakness, gait difficulty, fever, abdominal pain, nausea, vomiting, diarrhea, cough, shortness of breath, leg swelling, or any other associated symptoms.   The history is provided by the patient. No language interpreter was used.    Past Medical History:  Diagnosis Date  . Chronic fatigue   . Diabetes mellitus 2011  . DJD (degenerative joint disease)    of neck    . Dyslipidemia   . Hypertension 2005  . Mild obesity   . Postmenopausal   . Wears glasses     Patient Active Problem List   Diagnosis Date Noted  . Encounter for examination following treatment at hospital 04/02/2016  . Acute diverticulitis of intestine 04/02/2016  . Acute medial meniscal tear 02/26/2016  . Callus of foot 11/05/2015  . Chronic knee pain 10/31/2015  . Primary osteoarthritis of both knees 06/26/2015  . 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 Thoracic spine pain 09/28/2014  . Hip pain 08/06/2012  . Dysphagia 04/28/2012  . 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 Hot flashes, menopausal  09/10/2011  . 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 INSOMNIA, CHRONIC 11/28/2009  . Hyperlipidemia with target LDL less than 100 10/12/2007  . Obesity (BMI 30.0-34.9) 10/12/2007  . ESSENTIAL HYPERTENSION, BENIGN 10/12/2007    Past Surgical History:  Procedure Laterality Date  . ABDOMINAL HYSTERECTOMY  1992   fibroids  . APPENDECTOMY    . BREAST LUMPECTOMY WITH RADIOACTIVE SEED LOCALIZATION Right 10/05/2014   Procedure: BREAST LUMPECTOMY WITH RADIOACTIVE SEED LOCALIZATION;  Surgeon: Erroll Luna, MD;  Location: [facility];  Service: General;  Laterality: Right;  . cataract Bilateral   . CYST EXCISION Right    arm  . EYE SURGERY Right 06/07/2013   cataract  . EYE SURGERY Left 09/14/2013   cataract  . KNEE ARTHROSCOPY Right 02/26/2016   Procedure: ARTHROSCOPY RIGHT KNEE WITH MENSICAL DEBRIDEMENT;  Surgeon: Gaynelle Arabian, MD;  Location: [facility];  Service: Orthopedics;  Laterality: Right;  . left breast biopsy    . LESION REMOVAL Right 03/30/2013   Procedure: EXCISION OF NEOPLASM ARM;  Surgeon: Jamesetta So, MD;  Location: [facility];  Service: General;  Laterality: Right;  . TUBAL LIGATION      OB History    No data available       Home Medications    Prior to Admission medications   Medication Sig Start Date End Date Taking? Authorizing Provider  acetaminophen (TYLENOL) 500 MG tablet Take 1,000 mg by mouth every 6 (six) hours as needed for mild pain.  Historical Provider, MD  aspirin EC 81 MG tablet Take 81 mg by mouth every evening.     Historical Provider, MD  benazepril (LOTENSIN) 10 MG tablet Take 1 tablet (10 mg total) by mouth daily. Patient taking differently: Take 10 mg by mouth at bedtime.  01/10/16   Fayrene Helper, MD  Calcium Carbonate-Vitamin D (OS-CAL 250 + D PO) Take 1 tablet by mouth daily.    Historical Provider, MD  cloNIDine (CATAPRES) 0.1 MG tablet TAKE 1 TABLET AT BEDTIME 04/04/16   Fayrene Helper, MD    HYDROcodone-acetaminophen (NORCO/VICODIN) 5-325 MG tablet Take 1 tablet by mouth every 6 (six) hours as needed. 03/31/16   Milton Ferguson, MD  ibuprofen (ADVIL,MOTRIN) 800 MG tablet Take 1 tablet (800 mg total) by mouth every 8 (eight) hours as needed (For pain.). 02/26/16   Gaynelle Arabian, MD  metFORMIN (GLUCOPHAGE-XR) 500 MG 24 hr tablet TAKE 1 TABLET TWICE DAILY 04/04/16   Fayrene Helper, MD  methocarbamol (ROBAXIN) 500 MG tablet Take 1 tablet (500 mg total) by mouth 4 (four) times daily. As needed for muscle spasm Patient not taking: Reported on 03/31/2016 02/26/16   Gaynelle Arabian, MD  ondansetron (ZOFRAN ODT) 4 MG disintegrating tablet 4mg  ODT q4 hours prn nausea/vomit Patient not taking: Reported on 04/02/2016 03/31/16   Milton Ferguson, MD  OVER THE COUNTER MEDICATION Take 1 capsule by mouth at bedtime. Liver Cleanse Formula    Historical Provider, MD  pravastatin (PRAVACHOL) 10 MG tablet Take 10 mg by mouth at bedtime.  12/27/15   Historical Provider, MD  promethazine (PHENERGAN) 25 MG tablet Take 1 tablet (25 mg total) by mouth every 8 (eight) hours as needed for nausea or vomiting. 04/02/16 04/09/16  Fayrene Helper, MD  tamoxifen (NOLVADEX) 20 MG tablet Take 20 mg by mouth at bedtime.  01/29/16   Historical Provider, MD    Family History Family History  Problem Relation Age of Onset  . Heart failure Father   . Hypertension Father   . Cancer Mother     liver cancer  . Hypertension Sister   . Hypertension Brother   . Diabetes Brother     Social History Social History  Substance Use Topics  . Smoking status: Never Smoker  . Smokeless tobacco: Never Used  . Alcohol use No     Allergies   Codeine and Propoxyphene n-acetaminophen   Review of Systems Review of Systems  Constitutional: Negative for fever.  Respiratory: Negative for cough and shortness of breath.   Musculoskeletal: Positive for back pain. Negative for gait problem.  Neurological: Negative for weakness and numbness.   All other systems reviewed and are negative.    Physical Exam Updated Vital Signs There were no vitals taken for this visit.  Physical Exam  Constitutional: She appears well-developed and well-nourished. No distress.  HENT:  Head: Normocephalic and atraumatic.  Mouth/Throat: Oropharynx is clear and moist. No oropharyngeal exudate.  Eyes: Conjunctivae and EOM are normal. Pupils are equal, round, and reactive to light. Right eye exhibits no discharge. Left eye exhibits no discharge. No scleral icterus.  Neck: Normal range of motion. Neck supple. No JVD present. No thyromegaly present.  Cardiovascular: Normal rate, regular rhythm, normal heart sounds and intact distal pulses.  Exam reveals no gallop and no friction rub.   No murmur heard. Pulmonary/Chest: Effort normal and breath sounds normal. No respiratory distress. She has no wheezes. She has no rales.  Musculoskeletal: Normal range of motion. She exhibits no edema  or tenderness.  No central spinal tenderness No paraspinal tenderness Soft compartments Supple joints  Lymphadenopathy:    She has no cervical adenopathy.  Neurological: She is alert. Coordination normal.  Normal strength and sensation of the bilateral UE's and LE's  Skin: Skin is warm and dry. No rash noted. No erythema.  Psychiatric: She has a normal mood and affect. Her behavior is normal.  Nursing note and vitals reviewed.    ED Treatments / Results   DIAGNOSTIC STUDIES: Oxygen Saturation is 97% on RA, normal by my interpretation.    COORDINATION OF CARE: 8:15 AM-Discussed treatment plan which includes DG T Spine and EKG with pt at bedside and pt agreed to plan. While discussing treatment plan with pt she tried to lay down and the change in position caused pain to the back.   Labs (all labs ordered are listed, but only abnormal results are displayed) Labs Reviewed - No data to display   Radiology No results found.  Procedures Procedures (including  critical care time)  Medications Ordered in ED Medications - No data to display   Initial Impression / Assessment and Plan / ED Course  I have reviewed the triage vital signs and the nursing notes.  Pertinent labs & imaging results that were available during my care of the patient were reviewed by me and considered in my medical decision making (see chart for details).  Clinical Course  Comment By Time  Reevaluated, pain is improved, the patient is comfortable, x-ray report negative for fracture, mass or other abnormalities, the patient was informed of all of these findings, stable for discharge Noemi Chapel, MD 07/29 [phone]    I personally performed the services described in this documentation, which was scribed in my presence. The recorded information has been reviewed and is accurate.      Final Clinical Impressions(s) / ED Diagnoses   Final diagnoses:  Muscle spasm of back    New Prescriptions New Prescriptions   IBUPROFEN (ADVIL,MOTRIN) 800 MG TABLET    Take 1 tablet (800 mg total) by mouth 3 (three) times daily.   METHOCARBAMOL (ROBAXIN) 500 MG TABLET    Take 1 tablet (500 mg total) by mouth 2 (two) times daily.     Noemi Chapel, MD 04/19/16 [phone]

## 2016-04-19 NOTE — Discharge Instructions (Signed)
Robaxin twice daily, ibuprofen 3 times daily as needed  Heating pad as needed, rest for 24 hours then slowly returned to normal activity  Seek medical attention for severe or worsening symptoms  Please obtain all of your results from medical records or have your doctors office obtain the results - share them with your doctor - you should be seen at your doctors office in the next 2 days. Call today to arrange your follow up. Take the medications as prescribed. Please review all of the medicines and only take them if you do not have an allergy to them. Please be aware that if you are taking birth control pills, taking other prescriptions, ESPECIALLY ANTIBIOTICS may make the birth control ineffective - if this is the case, either do not engage in sexual activity or use alternative methods of birth control such as condoms until you have finished the medicine and your family doctor says it is OK to restart them. If you are on a blood thinner such as COUMADIN, be aware that any other medicine that you take may cause the coumadin to either work too much, or not enough - you should have your coumadin level rechecked in next 7 days if this is the case.  ?  It is also a possibility that you have an allergic reaction to any of the medicines that you have been prescribed - Everybody reacts differently to medications and while MOST people have no trouble with most medicines, you may have a reaction such as nausea, vomiting, rash, swelling, shortness of breath. If this is the case, please stop taking the medicine immediately and contact your physician.  ?  You should return to the ER if you develop severe or worsening symptoms.

## 2016-04-19 NOTE — ED Triage Notes (Signed)
Complain of nagging pain to mid back that started about a week ago. States the pain is worse with movement and has been worse over the past couple of days

## 2016-04-23 ENCOUNTER — Encounter: Payer: Self-pay | Admitting: Family Medicine

## 2016-04-23 ENCOUNTER — Ambulatory Visit (INDEPENDENT_AMBULATORY_CARE_PROVIDER_SITE_OTHER): Payer: Commercial Managed Care - HMO | Admitting: Family Medicine

## 2016-04-23 ENCOUNTER — Other Ambulatory Visit: Payer: Self-pay

## 2016-04-23 VITALS — BP 122/82 | HR 72 | Resp 16 | Ht 64.0 in | Wt 192.0 lb

## 2016-04-23 DIAGNOSIS — I10 Essential (primary) hypertension: Secondary | ICD-10-CM | POA: Diagnosis not present

## 2016-04-23 DIAGNOSIS — G4700 Insomnia, unspecified: Secondary | ICD-10-CM

## 2016-04-23 DIAGNOSIS — Z09 Encounter for follow-up examination after completed treatment for conditions other than malignant neoplasm: Secondary | ICD-10-CM

## 2016-04-23 DIAGNOSIS — E785 Hyperlipidemia, unspecified: Secondary | ICD-10-CM

## 2016-04-23 DIAGNOSIS — E1121 Type 2 diabetes mellitus with diabetic nephropathy: Secondary | ICD-10-CM

## 2016-04-23 DIAGNOSIS — E559 Vitamin D deficiency, unspecified: Secondary | ICD-10-CM

## 2016-04-23 MED ORDER — BENAZEPRIL HCL 10 MG PO TABS: 10.0000 mg | ORAL_TABLET | Freq: Every day | ORAL | 1 refills | Status: DC

## 2016-04-23 MED ORDER — TEMAZEPAM 7.5 MG PO CAPS: 7.5000 mg | ORAL_CAPSULE | Freq: Every evening | ORAL | 3 refills | Status: DC | PRN

## 2016-04-23 MED ORDER — ASPIRIN EC 81 MG PO TBEC: 81.0000 mg | DELAYED_RELEASE_TABLET | Freq: Every evening | ORAL | 1 refills | Status: AC

## 2016-04-23 NOTE — Assessment & Plan Note (Signed)
Uncontrolled Sleep hygiene reviewed and written information offered also. Prescription sent for  medication needed.  

## 2016-04-23 NOTE — Assessment & Plan Note (Signed)
Improved lower thoracic pain. Clear lung exam  Pt to complete ibuprofen and robaxin as prescribed

## 2016-04-23 NOTE — Patient Instructions (Addendum)
Annual exam 10/ 5 or after, call if  You need me sooner  Fasting lipid , cmp and eGFr, HBa1C, TSH and Vit D in October  Take the entire course of ibuprofen and muscle relaxant as prescribed for back pain  New for sleep is restoril at bedtime  Insomnia Insomnia is a sleep disorder that makes it difficult to fall asleep or to stay asleep. Insomnia can cause tiredness (fatigue), low energy, difficulty concentrating, mood swings, and poor performance at work or school.  There are three different ways to classify insomnia:  Difficulty falling asleep.  Difficulty staying asleep.  Waking up too early in the morning. Any type of insomnia can be long-term (chronic) or short-term (acute). Both are common. Short-term insomnia usually lasts for three months or less. Chronic insomnia occurs at least three times a week for longer than three months. CAUSES  Insomnia may be caused by another condition, situation, or substance, such as:  Anxiety.  Certain medicines.  Gastroesophageal reflux disease (GERD) or other gastrointestinal conditions.  Asthma or other breathing conditions.  Restless legs syndrome, sleep apnea, or other sleep disorders.  Chronic pain.  Menopause. This may include hot flashes.  Stroke.  Abuse of alcohol, tobacco, or illegal drugs.  Depression.  Caffeine.   Neurological disorders, such as Alzheimer disease.  An overactive thyroid (hyperthyroidism). The cause of insomnia may not be known. RISK FACTORS Risk factors for insomnia include:  Gender. Women are more commonly affected than men.  Age. Insomnia is more common as you get older.  Stress. This may involve your professional or personal life.  Income. Insomnia is more common in people with lower income.  Lack of exercise.   Irregular work schedule or night shifts.  Traveling between different time zones. SIGNS AND SYMPTOMS If you have insomnia, trouble falling asleep or trouble staying asleep  is the main symptom. This may lead to other symptoms, such as:  Feeling fatigued.  Feeling nervous about going to sleep.  Not feeling rested in the morning.  Having trouble concentrating.  Feeling irritable, anxious, or depressed. TREATMENT  Treatment for insomnia depends on the cause. If your insomnia is caused by an underlying condition, treatment will focus on addressing the condition. Treatment may also include:   Medicines to help you sleep.  Counseling or therapy.  Lifestyle adjustments. HOME CARE INSTRUCTIONS   Take medicines only as directed by your health care provider.  Keep regular sleeping and waking hours. Avoid naps.  Keep a sleep diary to help you and your health care provider figure out what could be causing your insomnia. Include:   When you sleep.  When you wake up during the night.  How well you sleep.   How rested you feel the next day.  Any side effects of medicines you are taking.  What you eat and drink.   Make your bedroom a comfortable place where it is easy to fall asleep:  Put up shades or special blackout curtains to block light from outside.  Use a white noise machine to block noise.  Keep the temperature cool.   Exercise regularly as directed by your health care provider. Avoid exercising right before bedtime.  Use relaxation techniques to manage stress. Ask your health care provider to suggest some techniques that may work well for you. These may include:  Breathing exercises.  Routines to release muscle tension.  Visualizing peaceful scenes.  Cut back on alcohol, caffeinated beverages, and cigarettes, especially close to bedtime. These can disrupt your  sleep.  Do not overeat or eat spicy foods right before bedtime. This can lead to digestive discomfort that can make it hard for you to sleep.  Limit screen use before bedtime. This includes:  Watching TV.  Using your smartphone, tablet, and computer.  Stick to a  routine. This can help you fall asleep faster. Try to do a quiet activity, brush your teeth, and go to bed at the same time each night.  Get out of bed if you are still awake after 15 minutes of trying to sleep. Keep the lights down, but try reading or doing a quiet activity. When you feel sleepy, go back to bed.  Make sure that you drive carefully. Avoid driving if you feel very sleepy.  Keep all follow-up appointments as directed by your health care provider. This is important. SEEK MEDICAL CARE IF:   You are tired throughout the day or have trouble in your daily routine due to sleepiness.  You continue to have sleep problems or your sleep problems get worse. SEEK IMMEDIATE MEDICAL CARE IF:   You have serious thoughts about hurting yourself or someone else.   This information is not intended to replace advice given to you by your health care provider. Make sure you discuss any questions you have with your health care provider.   Document Released: 09/05/2000 Document Revised: 05/30/2015 Document Reviewed: 06/09/2014 Elsevier Interactive Patient Education [employer].

## 2016-04-27 NOTE — Assessment & Plan Note (Signed)
Controlled, no change in medication DASH diet and commitment to daily physical activity for a minimum of 30 minutes discussed and encouraged, as a part of hypertension management. The importance of attaining a healthy weight is also discussed.  BP/Weight 04/23/2016 04/19/2016 04/02/2016 04/01/2016 03/31/2016 02/26/2016 [id number]  Systolic BP 123XX123 123456 [id number] [id number]  Diastolic BP 82 81 80 73 - 63 78  Wt. (Lbs) 192 196 194 - 192.2 195 196  BMI 32.96 32.62 33.28 - 31.98 32.45 32.62

## 2016-04-27 NOTE — Progress Notes (Signed)
   Jillian [name]     MRN: [id number]      DOB: June 07, 1941   HPI [name]  Patient in for follow up of recent ED visit Discharge summary, and laboratory and radiology data are reviewed, and any questions or concerns are discussed. Reports improved back pain and denies pain with breathing, down to a 4 from and 8 in the ED C/o poor sleep difficulty both falling and staying asleep, denies any new anxiety or stress Denies polyuria, polydipsia, blurred vision , or hypoglycemic episodes.   ROS Denies recent fever or chills. Denies sinus pressure, nasal congestion, ear pain or sore throat. Denies chest congestion, productive cough or wheezing. Denies chest pains, palpitations and leg swelling Denies abdominal pain, nausea, vomiting,diarrhea or constipation.   Denies dysuria, frequency, hesitancy or incontinence.  Denies headaches, seizures, numbness, or tingling.  Denies skin break down or rash.   PE  BP 122/82   Pulse 72   Resp 16   Ht 5\' 4"  (1.626 m)   Wt 192 lb (87.1 kg)   SpO2 99%   BMI 32.96 kg/m   Patient alert and oriented and in no cardiopulmonary distress.  HEENT: No facial asymmetry, EOMI,   oropharynx pink and moist.  Neck supple no JVD, no mass.  Chest: Clear to auscultation bilaterally.  CVS: S1, S2 no murmurs, no S3.Regular rate.  ABD: Soft non tender.   Ext: No edema  MS: Adequate ROM spine, shoulders, hips and knees.  Skin: Intact, no ulcerations or rash noted.  Psych: Good eye contact, normal affect. Memory intact not anxious or depressed appearing.  CNS: CN 2-12 intact, power,  normal throughout.no focal deficits noted.   Assessment & Plan  Encounter for examination following treatment at hospital Improved lower thoracic pain. Clear lung exam  Pt to complete ibuprofen and robaxin as prescribed  INSOMNIA, CHRONIC Uncontrolled Sleep hygiene reviewed and written information offered also. Prescription sent for  medication  needed.   ESSENTIAL HYPERTENSION, BENIGN Controlled, no change in medication DASH diet and commitment to daily physical activity for a minimum of 30 minutes discussed and encouraged, as a part of hypertension management. The importance of attaining a healthy weight is also discussed.  BP/Weight 04/23/2016 04/19/2016 04/02/2016 04/01/2016 03/31/2016 02/26/2016 [id number]  Systolic BP 123XX123 123456 [id number] [id number]  Diastolic BP 82 81 80 73 - 63 78  Wt. (Lbs) 192 196 194 - 192.2 195 196  BMI 32.96 32.62 33.28 - 31.98 32.45 32.62

## 2016-04-28 ENCOUNTER — Other Ambulatory Visit: Payer: Self-pay

## 2016-04-28 MED ORDER — TRAZODONE HCL 50 MG PO TABS: 50.0000 mg | ORAL_TABLET | Freq: Every day | ORAL | 3 refills | Status: DC

## 2016-04-28 MED ORDER — METHOCARBAMOL 500 MG PO TABS: 500.0000 mg | ORAL_TABLET | Freq: Two times a day (BID) | ORAL | 0 refills | Status: DC

## 2016-04-28 MED ORDER — IBUPROFEN 800 MG PO TABS: 800.0000 mg | ORAL_TABLET | Freq: Three times a day (TID) | ORAL | 0 refills | Status: DC

## 2016-05-12 ENCOUNTER — Encounter (HOSPITAL_COMMUNITY): Payer: Commercial Managed Care - HMO | Attending: Hematology & Oncology | Admitting: Hematology & Oncology

## 2016-05-12 ENCOUNTER — Encounter (HOSPITAL_COMMUNITY): Payer: Self-pay | Admitting: Hematology & Oncology

## 2016-05-12 VITALS — BP 117/63 | HR 69 | Temp 97.9°F | Resp 18 | Wt 192.3 lb

## 2016-05-12 DIAGNOSIS — N6091 Unspecified benign mammary dysplasia of right breast: Secondary | ICD-10-CM

## 2016-05-12 DIAGNOSIS — N62 Hypertrophy of breast: Secondary | ICD-10-CM | POA: Diagnosis not present

## 2016-05-12 DIAGNOSIS — N951 Menopausal and female climacteric states: Secondary | ICD-10-CM

## 2016-05-12 DIAGNOSIS — M7989 Other specified soft tissue disorders: Secondary | ICD-10-CM

## 2016-05-12 DIAGNOSIS — D241 Benign neoplasm of right breast: Secondary | ICD-10-CM | POA: Diagnosis not present

## 2016-05-12 DIAGNOSIS — D649 Anemia, unspecified: Secondary | ICD-10-CM

## 2016-05-12 DIAGNOSIS — Z79811 Long term (current) use of aromatase inhibitors: Secondary | ICD-10-CM

## 2016-05-12 NOTE — Progress Notes (Signed)
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 female is here for follow-up of atypical ductal hyperplasia.  She underwent a R breast lumpectomy with Dr. Brantley [name] on 10/05/2014. She has been underoing diagnostic mammography for ongoing follow-up of a 1.2 x 1.4 x 0.9 cm hypoechoic lobular mass in the R breast at the 2:30 position 6 cm from the nipple. Last mammogram was in November of last year.   Jillian [name] is accompanied by her sister.   She is doing well. She denies issues with taking her Tamoxifen. She does not believe the Tamoxifen is making her hot flashes worse. She notes she has had these since she went through menopause  Reports being "still swoll up" in her right leg after her knee surgery in June. There is swelling present in the calf and ankle. She denies pain. Her knee is much better. But she believes the swelling is getting worse.   She has not been sleeping well. She was given medication, trazodone, which actually kept her up. She is no longer taking this for sleep.   Since our last visit, she had issues with diverticulitis.   She is aware she is due for her next mammogram in November.  She denies any problems with her breasts. No other concerns. Appetite is excellent.   MEDICAL HISTORY:  Past Medical History:  Diagnosis Date  . Chronic fatigue   . Diabetes mellitus 2011  . DJD (degenerative joint disease)    of neck    . Dyslipidemia   . Hypertension 2005  . Mild obesity   . Postmenopausal   . Wears glasses       SURGICAL HISTORY: Past Surgical History:  Procedure Laterality Date  . ABDOMINAL HYSTERECTOMY  1992   fibroids  . APPENDECTOMY    . BREAST LUMPECTOMY WITH RADIOACTIVE SEED LOCALIZATION Right 10/05/2014   Procedure: BREAST LUMPECTOMY WITH RADIOACTIVE SEED LOCALIZATION;  Surgeon: Jillian Luna, MD;  Location: [facility];  Service: General;  Laterality: Right;  . cataract Bilateral   . CYST EXCISION Right    arm  . EYE SURGERY Right 06/07/2013   cataract  . EYE SURGERY Left 09/14/2013   cataract  . KNEE ARTHROSCOPY Right 02/26/2016   Procedure: ARTHROSCOPY RIGHT KNEE WITH MENSICAL DEBRIDEMENT;  Surgeon: Jillian Arabian, MD;  Location: [facility];  Service: Orthopedics;  Laterality: Right;  . left breast biopsy    . LESION REMOVAL Right 03/30/2013   Procedure: EXCISION OF NEOPLASM ARM;  Surgeon: Jillian So, MD;  Location: [facility];  Service: General;  Laterality: Right;  . TUBAL LIGATION      SOCIAL HISTORY: Social History   Social History  . Marital status: Married    Spouse name: N/A  . Number of children: N/A  . Years of education: N/A   Occupational History  . Not on file.   Social History Main Topics  . Smoking status: Never Smoker  . Smokeless tobacco: Never Used  . Alcohol use No  . Drug use: No  . Sexual activity: Not Currently    Birth control/ protection: Surgical   Other Topics Concern  .  Not on file   Social History Narrative  . No narrative on file  The patient states she is "partially married"  Or "separated" she has 2 sons, 3 grandsons and 1 great grandson. She is a never smoker and does not drink ETOH. She has worked in [employer] and in [employer].  FAMILY HISTORY: Family History  Problem Relation Age of Onset  . Heart failure Father   . Hypertension Father   . Cancer Mother     liver cancer  . Hypertension Sister   . Hypertension Brother   . Diabetes Brother    indicated that her mother is deceased. She indicated that her  father is deceased. She indicated that her sister is alive. She indicated that all of her three brothers are alive. She indicated that both of her sons are alive.   Mother died at 35 from liver cancer, Father died at 38 from CHF.  3 Brothers and 1 sister, 2 brothers have prostate cancer.   ALLERGIES:  is allergic to codeine and propoxyphene n-acetaminophen.  MEDICATIONS:  Current Outpatient Prescriptions  Medication Sig Dispense Refill  . acetaminophen (TYLENOL) 500 MG tablet Take 1,000 mg by mouth every 6 (six) hours as needed for mild pain.    Marland Kitchen aspirin EC 81 MG tablet Take 1 tablet (81 mg total) by mouth every evening. 90 tablet 1  . benazepril (LOTENSIN) 10 MG tablet Take 1 tablet (10 mg total) by mouth daily. 90 tablet 1  . Calcium Carbonate-Vitamin D (OS-CAL 250 + D PO) Take 1 tablet by mouth daily.    . cloNIDine (CATAPRES) 0.1 MG tablet TAKE 1 TABLET AT BEDTIME 90 tablet 0  . ibuprofen (ADVIL,MOTRIN) 800 MG tablet Take 1 tablet (800 mg total) by mouth 3 (three) times daily. 21 tablet 0  . metFORMIN (GLUCOPHAGE-XR) 500 MG 24 hr tablet TAKE 1 TABLET TWICE DAILY 180 tablet 0  . methocarbamol (ROBAXIN) 500 MG tablet Take 1 tablet (500 mg total) by mouth 2 (two) times daily. 20 tablet 0  . OVER THE COUNTER MEDICATION Take 1 capsule by mouth at bedtime. Liver Cleanse Formula    . pravastatin (PRAVACHOL) 10 MG tablet Take 10 mg by mouth at bedtime.     . tamoxifen (NOLVADEX) 20 MG tablet Take 20 mg by mouth at bedtime.     . traZODone (DESYREL) 50 MG tablet Take 1 tablet (50 mg total) by mouth at bedtime. 30 tablet 3  . promethazine (PHENERGAN) 25 MG tablet Take 1 tablet (25 mg total) by mouth every 8 (eight) hours as needed for nausea or vomiting. 20 tablet 0   No current facility-administered medications for this visit.     Review of Systems  Constitutional: Negative for fever, chills, weight loss and malaise/fatigue.       Hot flashes  HENT: Negative for congestion, hearing loss,  nosebleeds, sore throat and tinnitus.   Eyes: Negative for blurred vision, double vision, pain and discharge.  Respiratory: Negative for cough, hemoptysis, sputum production, shortness of breath and wheezing.   Cardiovascular: Positive for leg swelling. Negative for chest pain, palpitations, claudication, and PND.  Right calf and ankle swelling secondary to knee surgery performed in June. Gastrointestinal: Negative for heartburn, nausea, vomiting, abdominal pain, diarrhea, constipation, blood in stool and melena.  Genitourinary: Negative for dysuria, urgency, frequency and hematuria.  Musculoskeletal: Positive for joint pain. Negative for myalgias.  Skin: Negative for itching and rash.  Neurological: Negative for dizziness, tingling, tremors, sensory change, speech change, focal  weakness, seizures, loss of consciousness, weakness and headaches.  Endo/Heme/Allergies: Does not bruise/bleed easily.  Psychiatric/Behavioral: Negative for depression, suicidal ideas, memory loss and substance abuse. The patient is not nervous/anxious and does not have insomnia.   14 point review of systems was performed and is negative except as detailed under history of present illness and above   PHYSICAL EXAMINATION: ECOG PERFORMANCE STATUS: 0 - Asymptomatic  Vitals:   05/12/16 1208  BP: 117/63  Pulse: 69  Resp: 18  Temp: 97.9 F (36.6 C)   Filed Weights   05/12/16 1208  Weight: 192 lb 4.8 oz (87.2 kg)    Physical Exam  Constitutional: She is oriented to person, place, and time and well-developed, well-nourished, and in no distress.  HENT:  Head: Normocephalic and atraumatic.  Nose: Nose normal.  Mouth/Throat: Oropharynx is clear and moist. No oropharyngeal exudate.  Eyes: Conjunctivae and EOM are normal. Pupils are equal, round, and reactive to light. Right eye exhibits no discharge. Left eye exhibits no discharge. No scleral icterus.  Neck: Normal range of motion. Neck supple. No tracheal  deviation present. No thyromegaly present.  Cardiovascular: Normal rate, regular rhythm and normal heart sounds.  Exam reveals no gallop and no friction rub.   No murmur heard. Pulmonary/Chest: Effort normal and breath sounds normal. She has no wheezes. She has no rales.  Abdominal: Soft. Bowel sounds are normal. She exhibits no distension and no mass. There is no tenderness. There is no rebound and no guarding.  Musculoskeletal: Normal range of motion, R knee wrap in place. She exhibits no edema.  Lymphadenopathy:    She has no cervical adenopathy.  Neurological: She is alert and oriented to person, place, and time. She has normal reflexes. No cranial nerve deficit. Gait normal. Coordination normal.  Skin: Skin is warm and dry. No rash noted.  Psychiatric: Mood, memory, affect and judgment normal.  Nursing note and vitals reviewed.   LABORATORY DATA:  I have reviewed the data as listed Lab Results  Component Value Date   WBC 9.2 03/31/2016   HGB 12.3 03/31/2016   HCT 38.7 03/31/2016   MCV 93.5 03/31/2016   PLT 183 03/31/2016     Chemistry      Component Value Date/Time   NA 139 03/31/2016 1915   K 3.5 03/31/2016 1915   CL 107 03/31/2016 1915   CO2 27 03/31/2016 1915   BUN 11 03/31/2016 1915   CREATININE 0.86 03/31/2016 1915   CREATININE 0.89 12/24/2015 0901      Component Value Date/Time   CALCIUM 9.0 03/31/2016 1915   ALKPHOS 59 03/31/2016 1915   AST 21 03/31/2016 1915   ALT 15 03/31/2016 1915   BILITOT 0.4 03/31/2016 1915      RADIOGRAPHIC STUDIES: I have personally reviewed the radiological images as listed in HPI and agreed with the findings in the report. Study Result   CLINICAL DATA:  Upper back pain EXAM: THORACIC SPINE 2 VIEWS COMPARISON:  02/17/2016 FINDINGS: Degenerative spurring in the mid thoracic spine. Normal alignment. No fracture. IMPRESSION: No acute bony abnormality. Electronically Signed   By: Rolm Baptise M.D.   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 Brantley [name] on 10/05/2014 High Risk Breast Disease Tamoxifen for chemoprevention Mild anemia Hot flashes not improved with Effexor or Gabapentin  She is doing well on tamoxifen. I again reviewed side effects of concern.  She has had a hysterectomy. She wishes to continue on chemopreventive Tamoxifen.  I again emphasized the importance of ongoing observation with yearly breast exam and ongoing mammography.    Her next screening mammogram is due in November.   I have ordered an ultrasound of the patient's right leg secondary to continued swelling after knee surgery performed in June and her Tamoxifen use.   Encouraged the patient to try melatonin supplement to help her fall asleep.   She is scheduled for follow up with her PCP, Dr. Moshe Cipro, on 07/28/16.  She will return for follow up in 6 months.   Orders Placed This Encounter  Procedures  . US Venous Img Lower Unilateral Right    Standing Status:   Future    Standing Expiration Date:   05/12/2017    Order Specific Question:   Reason for Exam (SYMPTOM  OR DIAGNOSIS REQUIRED)    Answer:   RLE swelling, knee surgery, patient in tamoxifen    Order Specific Question:   Preferred imaging location?    Answer:   [facility]     All questions were answered. The patient knows to call the clinic with any problems, questions or concerns.   This document serves as a record of services personally performed by Ancil Linsey, MD. It was created on her behalf by Arlyce Harman, a trained medical scribe. The creation of this record is based on the scribe's personal observations and the provider's statements to them. This document has been checked and approved by the attending provider.  I have reviewed the above documentation for accuracy and completeness, and I agree with the above.  This  note was electronically signed.    Kelby Fam. Whitney Muse, MD

## 2016-05-12 NOTE — Patient Instructions (Addendum)
[facility] at [facility] Discharge Instructions  RECOMMENDATIONS MADE BY THE CONSULTANT AND ANY TEST RESULTS WILL BE SENT TO YOUR REFERRING PHYSICIAN.  You saw Dr. Whitney Muse today. Right lower leg ultrasound because of swelling. Follow up in 6 months with doctor.  Thank you for choosing [facility] at [facility] to provide your oncology and hematology care.  To afford each patient quality time with our provider, please arrive at least 15 minutes before your scheduled appointment time.   Beginning January 23rd 2017 lab work for the [facility] will be done in the  Main lab at [facility] on 1st floor. If you have a lab appointment with the [facility] please come in thru the  Main Entrance and check in at the main information desk  You need to re-schedule your appointment should you arrive 10 or more minutes late.  We strive to give you quality time with our providers, and arriving late affects you and other patients whose appointments are after yours.  Also, if you no show three or more times for appointments you may be dismissed from the clinic at the providers discretion.     Again, thank you for choosing [facility]  Our hope is that these requests will decrease the amount of time that you wait before being seen by our physicians.       _____________________________________________________________  Should you have questions after your visit to [facility], please contact our office at [phone] between the hours of 8:30 a.m. and 4:30 p.m.  Voicemails left after 4:30 p.m. will not be returned until the following business day.  For prescription refill requests, have your pharmacy contact our office.         Resources For Cancer Patients and their Caregivers ? American Cancer Society: Can assist with transportation, wigs, general needs, runs Look Good Feel Better.        414-079-2763 ? Cancer  Care: Provides financial assistance, online support groups, medication/co-pay assistance.  1-800-813-HOPE 779-435-4622) ? [place] Assists [place] cancer patients and their families through emotional , educational and financial support.  (856) 315-1406 ? [place] DSS Where to apply for food stamps, Medicaid and utility assistance. 910-585-7620 ? [facility]: Transportation to medical appointments. 587-188-0234 ? Social Security Administration: May apply for disability if have a Stage IV cancer. 365-426-1612 (408) 332-7227 ? [employer], Disability and Transit Services: Assists with nutrition, care and transit needs. [place] Support Programs: @10RELATIVEDAYS @ > Cancer Support Group  2nd Tuesday of the month 1pm-2pm, Journey Room  > Creative Journey  3rd Tuesday of the month 1130am-1pm, Journey Room  > Look Good Feel Better  1st Wednesday of the month 10am-12 noon, Journey Room (Call [facility] to register [id number])

## 2016-05-14 ENCOUNTER — Ambulatory Visit (HOSPITAL_COMMUNITY)
Admission: RE | Admit: 2016-05-14 | Discharge: 2016-05-14 | Disposition: A | Discharge status: Home / Self Care | Payer: Commercial Managed Care - HMO | Source: Ambulatory Visit | Attending: Hematology & Oncology | Admitting: Hematology & Oncology

## 2016-05-14 DIAGNOSIS — M7989 Other specified soft tissue disorders: Secondary | ICD-10-CM | POA: Diagnosis not present

## 2016-05-14 DIAGNOSIS — M7121 Synovial cyst of popliteal space [Baker], right knee: Secondary | ICD-10-CM | POA: Diagnosis not present

## 2016-05-15 ENCOUNTER — Encounter (HOSPITAL_COMMUNITY): Payer: Self-pay | Admitting: *Deleted

## 2016-05-15 ENCOUNTER — Telehealth (HOSPITAL_COMMUNITY): Payer: Self-pay | Admitting: *Deleted

## 2016-05-15 ENCOUNTER — Emergency Department (HOSPITAL_COMMUNITY)
Admission: EM | Admit: 2016-05-15 | Discharge: 2016-05-15 | Disposition: A | Discharge status: Home / Self Care | Payer: Commercial Managed Care - HMO | Attending: Emergency Medicine | Admitting: Emergency Medicine

## 2016-05-15 ENCOUNTER — Ambulatory Visit: Payer: Commercial Managed Care - HMO | Admitting: Family Medicine

## 2016-05-15 DIAGNOSIS — Z79899 Other long term (current) drug therapy: Secondary | ICD-10-CM | POA: Insufficient documentation

## 2016-05-15 DIAGNOSIS — I10 Essential (primary) hypertension: Secondary | ICD-10-CM | POA: Diagnosis not present

## 2016-05-15 DIAGNOSIS — Z791 Long term (current) use of non-steroidal anti-inflammatories (NSAID): Secondary | ICD-10-CM | POA: Insufficient documentation

## 2016-05-15 DIAGNOSIS — M62830 Muscle spasm of back: Secondary | ICD-10-CM | POA: Diagnosis not present

## 2016-05-15 DIAGNOSIS — Z7982 Long term (current) use of aspirin: Secondary | ICD-10-CM | POA: Diagnosis not present

## 2016-05-15 DIAGNOSIS — E119 Type 2 diabetes mellitus without complications: Secondary | ICD-10-CM | POA: Diagnosis not present

## 2016-05-15 DIAGNOSIS — M546 Pain in thoracic spine: Secondary | ICD-10-CM | POA: Diagnosis present

## 2016-05-15 DIAGNOSIS — Z7984 Long term (current) use of oral hypoglycemic drugs: Secondary | ICD-10-CM | POA: Insufficient documentation

## 2016-05-15 MED ORDER — HYDROCODONE-ACETAMINOPHEN 5-325 MG PO TABS: ORAL_TABLET | ORAL | 0 refills | Status: DC

## 2016-05-15 MED ORDER — HYDROCODONE-ACETAMINOPHEN 5-325 MG PO TABS
1.0000 | ORAL_TABLET | Freq: Once | ORAL | Status: AC
  Administered 2016-05-15: 1 via ORAL
  Filled 2016-05-15: qty 1

## 2016-05-15 NOTE — ED Notes (Signed)
Pt made aware to return if symptoms worsen or if any life threatening symptoms occur.   

## 2016-05-15 NOTE — Discharge Instructions (Signed)
Alternate ice and heat to back. You may continue the ibuprofen and Robaxin as directed. Take the hydrocodone only if needed. Follow-up with your primary doctor for recheck. Return here for any worsening symptoms.

## 2016-05-15 NOTE — ED Triage Notes (Signed)
Pt comes in with middle back pain that started after she helped to move her husband. She states this happened several days ago. Her pain worsens with movement. NAD noted. Pt is ambulatory.

## 2016-05-15 NOTE — Telephone Encounter (Signed)
-----   Message from Patrici Ranks, MD sent at 05/14/2016  9:52 PM EDT ----- R bakers cyst behind knee No blood clot. Can go back to surgeon to treat Dr.P

## 2016-05-15 NOTE — ED Provider Notes (Signed)
[facility] DEPT Provider Note   CSN: [id number] Arrival date & time: 05/15/16  1104     History   Chief Complaint Chief Complaint  Patient presents with  . Back Pain    HPI Jillian [name] is a 75 y.o. female.  HPI  Jillian [name] is a 75 y.o. female who presents to the Emergency Department complaining of gradual onset mid upper back pain for several days.  She states that she was helping move her husband at onset of pain and now has pain with bending and arm movement.  She has tried tylenol without relief.  She states she was seen here last month and treated for similar pain.  Pain improves at rest.  She denies chest pain, shortness of breath, numbness or weakness.   Past Medical History:  Diagnosis Date  . Chronic fatigue   . Diabetes mellitus 2011  . DJD (degenerative joint disease)    of neck    . Dyslipidemia   . Hypertension 2005  . Mild obesity   . Postmenopausal   . Wears glasses     Patient Active Problem List   Diagnosis Date Noted  . Encounter for examination following treatment at hospital 04/02/2016  . Acute diverticulitis of intestine 04/02/2016  . Acute medial meniscal tear 02/26/2016  . Callus of foot 11/05/2015  . Primary osteoarthritis of both knees 06/26/2015  . 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 Thoracic spine pain 09/28/2014  . Hip pain 08/06/2012  . Dysphagia 04/28/2012  . 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 Hot flashes, menopausal 09/10/2011  . 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 INSOMNIA, CHRONIC 11/28/2009  . Hyperlipidemia with target LDL less than 100 10/12/2007  . Obesity (BMI 30.0-34.9) 10/12/2007  . ESSENTIAL HYPERTENSION, BENIGN 10/12/2007    Past Surgical History:  Procedure Laterality Date  . ABDOMINAL HYSTERECTOMY  1992   fibroids  . APPENDECTOMY    . BREAST LUMPECTOMY WITH RADIOACTIVE SEED LOCALIZATION Right 10/05/2014   Procedure: BREAST LUMPECTOMY WITH  RADIOACTIVE SEED LOCALIZATION;  Surgeon: Erroll Luna, MD;  Location: [facility];  Service: General;  Laterality: Right;  . cataract Bilateral   . CYST EXCISION Right    arm  . EYE SURGERY Right 06/07/2013   cataract  . EYE SURGERY Left 09/14/2013   cataract  . KNEE ARTHROSCOPY Right 02/26/2016   Procedure: ARTHROSCOPY RIGHT KNEE WITH MENSICAL DEBRIDEMENT;  Surgeon: Gaynelle Arabian, MD;  Location: [facility];  Service: Orthopedics;  Laterality: Right;  . left breast biopsy    . LESION REMOVAL Right 03/30/2013   Procedure: EXCISION OF NEOPLASM ARM;  Surgeon: Jamesetta So, MD;  Location: [facility];  Service: General;  Laterality: Right;  . TUBAL LIGATION      OB History    No data available       Home Medications    Prior to Admission medications   Medication Sig Start Date End Date Taking? Authorizing Provider  acetaminophen (TYLENOL) 500 MG tablet Take 1,000 mg by mouth every 6 (six) hours as needed for mild pain.   Yes Historical Provider, MD  aspirin EC 81 MG tablet Take 1 tablet (81 mg total) by mouth every evening. 04/23/16  Yes Fayrene Helper, MD  benazepril (LOTENSIN) 10 MG tablet Take 1 tablet (10 mg total) by mouth daily. 04/23/16  Yes Fayrene Helper, MD  Calcium Carbonate-Vitamin D (OS-CAL 250 + D PO) Take 1 tablet by mouth daily.  Yes Historical Provider, MD  cloNIDine (CATAPRES) 0.1 MG tablet TAKE 1 TABLET AT BEDTIME Patient taking differently: TAKE 1 TABLET DAILY. 04/04/16  Yes Fayrene Helper, MD  ibuprofen (ADVIL,MOTRIN) 800 MG tablet Take 1 tablet (800 mg total) by mouth 3 (three) times daily. 04/28/16  Yes Fayrene Helper, MD  metFORMIN (GLUCOPHAGE-XR) 500 MG 24 hr tablet TAKE 1 TABLET TWICE DAILY 04/04/16  Yes Fayrene Helper, MD  methocarbamol (ROBAXIN) 500 MG tablet Take 1 tablet (500 mg total) by mouth 2 (two) times daily. 04/28/16  Yes Fayrene Helper, MD  pravastatin (PRAVACHOL) 10 MG tablet Take 10 mg by mouth daily.  12/27/15  Yes Historical  Provider, MD  tamoxifen (NOLVADEX) 20 MG tablet Take 20 mg by mouth daily.  01/29/16  Yes Historical Provider, MD  traZODone (DESYREL) 50 MG tablet Take 1 tablet (50 mg total) by mouth at bedtime. 04/28/16  Yes Fayrene Helper, MD  promethazine (PHENERGAN) 25 MG tablet Take 1 tablet (25 mg total) by mouth every 8 (eight) hours as needed for nausea or vomiting. Patient not taking: Reported on 05/15/2016 04/02/16 04/09/16  Fayrene Helper, MD    Family History Family History  Problem Relation Age of Onset  . Heart failure Father   . Hypertension Father   . Cancer Mother     liver cancer  . Hypertension Sister   . Hypertension Brother   . Diabetes Brother     Social History Social History  Substance Use Topics  . Smoking status: Never Smoker  . Smokeless tobacco: Never Used  . Alcohol use No     Allergies   Codeine and Propoxyphene n-acetaminophen   Review of Systems Review of Systems  Constitutional: Negative for fever.  Respiratory: Negative for chest tightness and shortness of breath.   Cardiovascular: Negative for chest pain.  Gastrointestinal: Negative for abdominal pain, constipation and vomiting.  Genitourinary: Negative for decreased urine volume, difficulty urinating, dysuria, flank pain and hematuria.  Musculoskeletal: Positive for back pain. Negative for joint swelling and neck pain.  Skin: Negative for rash.  Neurological: Negative for dizziness, weakness and numbness.  All other systems reviewed and are negative.    Physical Exam Updated Vital Signs BP 118/70 (BP Location: Left Arm)   Pulse 72   Temp 98.6 F (37 C) (Oral)   Resp 18   SpO2 100%   Physical Exam  Constitutional: She is oriented to person, place, and time. She appears well-developed and well-nourished. No distress.  HENT:  Head: Normocephalic and atraumatic.  Neck: Normal range of motion. Neck supple.  Cardiovascular: Normal rate, regular rhythm, normal heart sounds and intact distal  pulses.   No murmur heard. Pulmonary/Chest: Effort normal and breath sounds normal. No respiratory distress.  Abdominal: Soft. She exhibits no distension. There is no tenderness.  Musculoskeletal: She exhibits tenderness. She exhibits no edema.       Lumbar back: She exhibits tenderness and pain. She exhibits normal range of motion, no swelling, no deformity, no laceration and normal pulse.  ttp of the thoracic spine and bilateral paraspinal muscles.  radial pulses are brisk and symmetrical.  Distal sensation intact.  Pt has 5/5 strength against resistance of bilateral upper extremities.     Neurological: She is alert and oriented to person, place, and time. She has normal strength. No sensory deficit. She exhibits normal muscle tone. Coordination and gait normal.  Reflex Scores:      Patellar reflexes are 2+ on the right side and  2+ on the left side.      Achilles reflexes are 2+ on the right side and 2+ on the left side. Skin: Skin is warm and dry. No rash noted.  Psychiatric: She has a normal mood and affect.  Nursing note and vitals reviewed.    ED Treatments / Results  Labs (all labs ordered are listed, but only abnormal results are displayed) Labs Reviewed - No data to display  EKG  EKG Interpretation None       Radiology   Procedures Procedures (including critical care time)  Medications Ordered in ED Medications  HYDROcodone-acetaminophen (NORCO/VICODIN) 5-325 MG per tablet 1 tablet (1 tablet Oral Given 05/15/16 1219)     Initial Impression / Assessment and Plan / ED Course  I have reviewed the triage vital signs and the nursing notes.  Pertinent labs & imaging results that were available during my care of the patient were reviewed by me and considered in my medical decision making (see chart for details).  Clinical Course    Pain to mid upper back consistent with previous pain from last month. Previous X-ray and EKG reviewed by me. XR showed deg changes. Care  plan discussed with Dr. Jeneen Rinks. Pain is reproducible with palpation and movement. She remains neurovascularly intact doubt cardiac process. No hypotension. No focal neuro deficits. Agrees to vicodin and OTC NSAID   Final Clinical Impressions(s) / ED Diagnoses   Final diagnoses:  Muscle spasm of back    New Prescriptions Discharge Medication List as of 05/15/2016 12:54 PM    START taking these medications   Details  HYDROcodone-acetaminophen (NORCO/VICODIN) 5-325 MG tablet Take one tab po q 6 hrs prn pain, Print         Kem Parkinson, PA-C 05/16/16 2224    Tanna Furry, MD 05/20/16 1715

## 2016-05-16 ENCOUNTER — Telehealth: Payer: Self-pay | Admitting: Family Medicine

## 2016-05-16 ENCOUNTER — Other Ambulatory Visit: Payer: Self-pay | Admitting: Family Medicine

## 2016-05-16 NOTE — Telephone Encounter (Signed)
Please review ED visit.  Any recommendations?

## 2016-05-16 NOTE — Telephone Encounter (Signed)
Attempted to reach patient.  No answer.  No option to leave message.

## 2016-05-16 NOTE — Telephone Encounter (Signed)
I am entering pred dose pack and ibuprofen,and also a muscle relaxant if she has none.Pls let her know and send. Arrange appt with me on Monday pls Morning preferred. Advise if  Develops weakness, numbness or incontinence of stool or if no relief with remaining uncontrolled pain will need to return to Ed. Sounds as though she is experiencing pain from bad arthritis in back with possible pinched nerve like what she had in her neck

## 2016-05-16 NOTE — Telephone Encounter (Signed)
Returned patient's call.   # listed does not have option to leave message.  Will try again.

## 2016-05-16 NOTE — Telephone Encounter (Signed)
Jillian [name] was in the ER at [facility] yesterday with back and muscle spasms. She states that they gave her Hydrocodone for pain and it has not helped at all. She is asking what else can she do, please advise?

## 2016-05-17 ENCOUNTER — Emergency Department (HOSPITAL_COMMUNITY): Payer: Commercial Managed Care - HMO

## 2016-05-17 ENCOUNTER — Emergency Department (HOSPITAL_COMMUNITY)
Admission: EM | Admit: 2016-05-17 | Discharge: 2016-05-17 | Disposition: A | Discharge status: Home / Self Care | Payer: Commercial Managed Care - HMO | Attending: Emergency Medicine | Admitting: Emergency Medicine

## 2016-05-17 ENCOUNTER — Encounter (HOSPITAL_COMMUNITY): Payer: Self-pay

## 2016-05-17 DIAGNOSIS — Z7982 Long term (current) use of aspirin: Secondary | ICD-10-CM | POA: Insufficient documentation

## 2016-05-17 DIAGNOSIS — E119 Type 2 diabetes mellitus without complications: Secondary | ICD-10-CM | POA: Diagnosis not present

## 2016-05-17 DIAGNOSIS — Z7984 Long term (current) use of oral hypoglycemic drugs: Secondary | ICD-10-CM | POA: Diagnosis not present

## 2016-05-17 DIAGNOSIS — M546 Pain in thoracic spine: Secondary | ICD-10-CM | POA: Diagnosis not present

## 2016-05-17 DIAGNOSIS — M549 Dorsalgia, unspecified: Secondary | ICD-10-CM | POA: Diagnosis not present

## 2016-05-17 DIAGNOSIS — I10 Essential (primary) hypertension: Secondary | ICD-10-CM | POA: Diagnosis not present

## 2016-05-17 DIAGNOSIS — R52 Pain, unspecified: Secondary | ICD-10-CM

## 2016-05-17 DIAGNOSIS — M47814 Spondylosis without myelopathy or radiculopathy, thoracic region: Secondary | ICD-10-CM | POA: Diagnosis not present

## 2016-05-17 DIAGNOSIS — Z79899 Other long term (current) drug therapy: Secondary | ICD-10-CM | POA: Insufficient documentation

## 2016-05-17 DIAGNOSIS — G8929 Other chronic pain: Secondary | ICD-10-CM | POA: Diagnosis not present

## 2016-05-17 MED ORDER — CYCLOBENZAPRINE HCL 10 MG PO TABS: 10.0000 mg | ORAL_TABLET | Freq: Three times a day (TID) | ORAL | 0 refills | Status: DC | PRN

## 2016-05-17 MED ORDER — KETOROLAC TROMETHAMINE 30 MG/ML IJ SOLN
15.0000 mg | Freq: Once | INTRAMUSCULAR | Status: AC
  Administered 2016-05-17: 15 mg via INTRAMUSCULAR
  Filled 2016-05-17: qty 1

## 2016-05-17 MED ORDER — OXYCODONE-ACETAMINOPHEN 5-325 MG PO TABS: 1.0000 | ORAL_TABLET | Freq: Three times a day (TID) | ORAL | 0 refills | Status: DC | PRN

## 2016-05-17 NOTE — ED Provider Notes (Signed)
[facility] DEPT Provider Note   CSN: [id number] Arrival date & time: 05/17/16  0036     History   Chief Complaint Chief Complaint  Patient presents with  . Back Pain    HPI Jillian [name] is a 75 y.o. female.  HPI Pt reports chronic mid back pain for several months, worse recently, seen yesterday for same. Pt states pain meds are not helping. Patient states that she has not fallen recently.  She denies any paresthesias.  Denies weakness, bowel or bladder incontinence.  Past Medical History:  Diagnosis Date  . Chronic fatigue   . Diabetes mellitus 2011  . DJD (degenerative joint disease)    of neck    . Dyslipidemia   . Hypertension 2005  . Mild obesity   . Postmenopausal   . Wears glasses     Patient Active Problem List   Diagnosis Date Noted  . Encounter for examination following treatment at hospital 04/02/2016  . Acute diverticulitis of intestine 04/02/2016  . Acute medial meniscal tear 02/26/2016  . Callus of foot 11/05/2015  . Primary osteoarthritis of both knees 06/26/2015  . 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 Thoracic spine pain 09/28/2014  . Hip pain 08/06/2012  . Dysphagia 04/28/2012  . 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 Hot flashes, menopausal 09/10/2011  . 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 INSOMNIA, CHRONIC 11/28/2009  . Hyperlipidemia with target LDL less than 100 10/12/2007  . Obesity (BMI 30.0-34.9) 10/12/2007  . ESSENTIAL HYPERTENSION, BENIGN 10/12/2007    Past Surgical History:  Procedure Laterality Date  . ABDOMINAL HYSTERECTOMY  1992   fibroids  . APPENDECTOMY    . BREAST LUMPECTOMY WITH RADIOACTIVE SEED LOCALIZATION Right 10/05/2014   Procedure: BREAST LUMPECTOMY WITH RADIOACTIVE SEED LOCALIZATION;  Surgeon: Erroll Luna, MD;  Location: [facility];  Service: General;  Laterality: Right;  . cataract Bilateral   . CYST EXCISION Right    arm  . EYE  SURGERY Right 06/07/2013   cataract  . EYE SURGERY Left 09/14/2013   cataract  . KNEE ARTHROSCOPY Right 02/26/2016   Procedure: ARTHROSCOPY RIGHT KNEE WITH MENSICAL DEBRIDEMENT;  Surgeon: Gaynelle Arabian, MD;  Location: [facility];  Service: Orthopedics;  Laterality: Right;  . left breast biopsy    . LESION REMOVAL Right 03/30/2013   Procedure: EXCISION OF NEOPLASM ARM;  Surgeon: Jamesetta So, MD;  Location: [facility];  Service: General;  Laterality: Right;  . TUBAL LIGATION      OB History    No data available       Home Medications    Prior to Admission medications   Medication Sig Start Date End Date Taking? Authorizing Provider  acetaminophen (TYLENOL) 500 MG tablet Take 1,000 mg by mouth every 6 (six) hours as needed for mild pain.    Historical Provider, MD  aspirin EC 81 MG tablet Take 1 tablet (81 mg total) by mouth every evening. 04/23/16   Fayrene Helper, MD  benazepril (LOTENSIN) 10 MG tablet Take 1 tablet (10 mg total) by mouth daily. 04/23/16   Fayrene Helper, MD  Calcium Carbonate-Vitamin D (OS-CAL 250 + D PO) Take 1 tablet by mouth daily.    Historical Provider, MD  cloNIDine (CATAPRES) 0.1 MG tablet TAKE 1 TABLET AT BEDTIME Patient taking differently: TAKE 1 TABLET DAILY. 04/04/16   Fayrene Helper, MD  cyclobenzaprine (FLEXERIL) 10 MG tablet Take 1 tablet (10 mg total) by mouth  3 (three) times daily as needed for muscle spasms. 05/17/16   Leonard Schwartz, MD  HYDROcodone-acetaminophen (NORCO/VICODIN) 5-325 MG tablet Take one tab po q 6 hrs prn pain 05/15/16   Tammy Triplett, PA-C  ibuprofen (ADVIL,MOTRIN) 800 MG tablet Take 1 tablet (800 mg total) by mouth 3 (three) times daily. 05/16/16   Fayrene Helper, MD  metFORMIN (GLUCOPHAGE-XR) 500 MG 24 hr tablet TAKE 1 TABLET TWICE DAILY 04/04/16   Fayrene Helper, MD  methocarbamol (ROBAXIN) 500 MG tablet Take 1 tablet (500 mg total) by mouth 2 (two) times daily. 04/28/16   Fayrene Helper, MD  oxyCODONE-acetaminophen  (PERCOCET/ROXICET) 5-325 MG tablet Take 1 tablet by mouth every 8 (eight) hours as needed for severe pain. 05/17/16   Leonard Schwartz, MD  pravastatin (PRAVACHOL) 10 MG tablet Take 10 mg by mouth daily.  12/27/15   Historical Provider, MD  predniSONE (STERAPRED UNI-PAK 21 TAB) 5 MG (21) TBPK tablet Take 1 tablet (5 mg total) by mouth as directed. Use as directed 05/16/16   Fayrene Helper, MD  promethazine (PHENERGAN) 25 MG tablet Take 1 tablet (25 mg total) by mouth every 8 (eight) hours as needed for nausea or vomiting. Patient not taking: Reported on 05/15/2016 04/02/16 04/09/16  Fayrene Helper, MD  ranitidine (ZANTAC) 300 MG tablet Take 1 tablet (300 mg total) by mouth at bedtime. 05/16/16   Fayrene Helper, MD  tamoxifen (NOLVADEX) 20 MG tablet Take 20 mg by mouth daily.  01/29/16   Historical Provider, MD  traZODone (DESYREL) 50 MG tablet Take 1 tablet (50 mg total) by mouth at bedtime. 04/28/16   Fayrene Helper, MD    Family History Family History  Problem Relation Age of Onset  . Heart failure Father   . Hypertension Father   . Cancer Mother     liver cancer  . Hypertension Sister   . Hypertension Brother   . Diabetes Brother     Social History Social History  Substance Use Topics  . Smoking status: Never Smoker  . Smokeless tobacco: Never Used  . Alcohol use No     Allergies   Codeine and Propoxyphene n-acetaminophen   Review of Systems Review of Systems  Neurological: Negative for numbness and headaches.  All other systems reviewed and are negative.    Physical Exam Updated Vital Signs BP 149/60 (BP Location: Left Arm)   Pulse 73   Temp 97.9 F (36.6 C) (Oral)   Resp 15   SpO2 100%   Physical Exam  Constitutional: She is oriented to person, place, and time. She appears well-developed and well-nourished. No distress.  HENT:  Head: Normocephalic and atraumatic.  Eyes: Pupils are equal, round, and reactive to light.  Neck: Normal range of motion.    Cardiovascular: Normal rate and intact distal pulses.   Pulmonary/Chest: No respiratory distress.  Abdominal: Normal appearance. She exhibits no distension.  Musculoskeletal: Normal range of motion.       Thoracic back: She exhibits pain.       Back:  Neurological: She is alert and oriented to person, place, and time. No cranial nerve deficit.  Skin: Skin is warm and dry. No rash noted.  Psychiatric: She has a normal mood and affect. Her behavior is normal.  Nursing note and vitals reviewed.    ED Treatments / Results  Labs (all labs ordered are listed, but only abnormal results are displayed) Labs Reviewed - No data to display  EKG  EKG Interpretation None  Radiology Dg Thoracic Spine 2 View  Result Date: 05/17/2016 CLINICAL DATA:  Chronic mid back pain for several months. EXAM: THORACIC SPINE 2 VIEWS COMPARISON:  04/19/2016 FINDINGS: Normal alignment of the thoracic spine. Diffuse degenerative changes with narrowed interspaces and endplate hypertrophic changes. No vertebral compression deformities. No focal bone lesion or bone destruction. No paraspinal soft tissue swelling. IMPRESSION: Degenerative changes in the thoracic spine. No acute displaced fractures identified. Electronically Signed   By: Lucienne Capers M.D.   On: 05/17/2016 03:18    Procedures Procedures (including critical care time)  Medications Ordered in ED Medications  ketorolac (TORADOL) 30 MG/ML injection 15 mg (not administered)     Initial Impression / Assessment and Plan / ED Course  I have reviewed the triage vital signs and the nursing notes.  Pertinent labs & imaging results that were available during my care of the patient were reviewed by me and considered in my medical decision making (see chart for details).  Clinical Course      Final Clinical Impressions(s) / ED Diagnoses   Final diagnoses:  Pain  Chronic back pain    New Prescriptions New Prescriptions    OXYCODONE-ACETAMINOPHEN (PERCOCET/ROXICET) 5-325 MG TABLET    Take 1 tablet by mouth every 8 (eight) hours as needed for severe pain.     Leonard Schwartz, MD 05/17/16 [phone]

## 2016-05-17 NOTE — ED Triage Notes (Signed)
Pt reports chronic mid back pain for several months, worse recently, seen yesterday for same. Pt states pain meds are not helping.

## 2016-05-20 NOTE — Telephone Encounter (Signed)
-----   Message from Patrici Ranks, MD sent at 05/14/2016  9:52 PM EDT ----- R bakers cyst behind knee No blood clot. Can go back to surgeon to treat Dr.P

## 2016-05-21 ENCOUNTER — Encounter: Payer: Self-pay | Admitting: Family Medicine

## 2016-05-21 ENCOUNTER — Ambulatory Visit (HOSPITAL_COMMUNITY)
Admission: RE | Admit: 2016-05-21 | Discharge: 2016-05-21 | Disposition: A | Discharge status: Home / Self Care | Payer: Commercial Managed Care - HMO | Source: Ambulatory Visit | Attending: Family Medicine | Admitting: Family Medicine

## 2016-05-21 ENCOUNTER — Ambulatory Visit (INDEPENDENT_AMBULATORY_CARE_PROVIDER_SITE_OTHER): Payer: Commercial Managed Care - HMO | Admitting: Family Medicine

## 2016-05-21 ENCOUNTER — Other Ambulatory Visit: Payer: Self-pay | Admitting: Family Medicine

## 2016-05-21 VITALS — BP 130/84 | HR 94 | Resp 16 | Ht 64.0 in | Wt 191.0 lb

## 2016-05-21 DIAGNOSIS — M533 Sacrococcygeal disorders, not elsewhere classified: Secondary | ICD-10-CM

## 2016-05-21 DIAGNOSIS — M25552 Pain in left hip: Secondary | ICD-10-CM | POA: Diagnosis not present

## 2016-05-21 DIAGNOSIS — I10 Essential (primary) hypertension: Secondary | ICD-10-CM

## 2016-05-21 DIAGNOSIS — Z09 Encounter for follow-up examination after completed treatment for conditions other than malignant neoplasm: Secondary | ICD-10-CM

## 2016-05-21 DIAGNOSIS — M5442 Lumbago with sciatica, left side: Secondary | ICD-10-CM

## 2016-05-21 DIAGNOSIS — G8929 Other chronic pain: Secondary | ICD-10-CM

## 2016-05-21 DIAGNOSIS — M549 Dorsalgia, unspecified: Secondary | ICD-10-CM | POA: Insufficient documentation

## 2016-05-21 MED ORDER — PREDNISONE 10 MG (21) PO TBPK: ORAL_TABLET | ORAL | 0 refills | Status: DC

## 2016-05-21 MED ORDER — RANITIDINE HCL 300 MG PO CAPS: 300.0000 mg | ORAL_CAPSULE | Freq: Every evening | ORAL | 0 refills | Status: DC

## 2016-05-21 MED ORDER — METHYLPREDNISOLONE ACETATE 80 MG/ML IJ SUSP
120.0000 mg | Freq: Once | INTRAMUSCULAR | Status: AC
  Administered 2016-05-21: 120 mg via INTRAMUSCULAR

## 2016-05-21 NOTE — Assessment & Plan Note (Signed)
Ongoing pain x 1 week, despite 2 ED visits. Pain now localized over left SI joint, and radiating somewhat. X rays of left sI joint and left hip  depo medrol 120 mg im and pred 10 mg dose pack Direct SI joint injections not available locally by radiology. Pt to call in 48 hrs if not improved, will refer to rheumatology

## 2016-05-21 NOTE — Assessment & Plan Note (Addendum)
3 day h/o disabling left sI joint pain limiting safe mobility, at times radiaiting to left groin and thigh, has had 2 ED visit Depo medrol 120 mg im and pred 10 mg dose pack x 6 days, pt to call in 48 hrs if no  Better will refer to Dr Charlestine Night

## 2016-05-21 NOTE — Assessment & Plan Note (Signed)
1 week history, no improvement despite 2 ED visits, steroids with zantac to be added

## 2016-05-21 NOTE — Assessment & Plan Note (Signed)
Controlled, no change in medication  

## 2016-05-21 NOTE — Patient Instructions (Signed)
F/u in Novemebr as before, call if you need me sooner  You are being treated for acute right hip pain, depo medrol given in office, and 6 days of prednisone and one month of zantac prescribeds  Call if not better , I will refer you to Dr Charlestine Night.   X ray of hip today also please

## 2016-05-21 NOTE — Progress Notes (Signed)
   [name]     MRN: [id number]      DOB: 09-05-1941   HPI [name]  Patient in for follow up of recent ED evaliuation Discharge summary, and laboratory and radiology data are reviewed, and any questions or concerns are discussed Presented twice in the past 1 week with initially acute back and LLE pain, , 2 days later returned with left buttock pain over the SI joint region, which sometimes  Radiates  to groin and thigh on left. Has had anti inflammatories and muscle relaxants and oxycodone, states pain is still a 10, has no position of comfort, denies lower extremity weakness ,  Numbness or  Incontinence of stool or urine. Mobility is limited by pain   ROS Denies recent fever or chills. Denies sinus pressure, nasal congestion, ear pain or sore throat. Denies chest congestion, productive cough or wheezing. Denies chest pains, palpitations and leg swelling Denies abdominal pain, nausea, vomiting,diarrhea or constipation.   Denies dysuria, frequency, hesitancy or incontinence.  Denies headaches, seizures,  Denies depression, anxiety or insomnia. Denies skin break down or rash.   PE  BP 130/84   Pulse 94   Resp 16   Ht 5\' 4"  (1.626 m)   Wt 191 lb (86.6 kg)   SpO2 96%   BMI 32.79 kg/m   Patient alert and oriented and in no cardiopulmonary distress.Pt in pain with movement and at rest  HEENT: No facial asymmetry, EOMI,   oropharynx pink and moist.  Neck supple no JVD, no mass.  Chest: Clear to auscultation bilaterally.  CVS: S1, S2 no murmurs, no S3.Regular rate.  ABD: Soft non tender.   Ext: No edema  MS: decreased ROM lumbar spine, tender over left SI joint , decreased ROM left hip  Skin: Intact, no ulcerations or rash noted.  Psych: Good eye contact, normal affect. Memory intact not anxious or depressed appearing.  CNS: CN 2-12 intact, power,  normal throughout.no focal deficits noted.   Assessment & Plan  Chronic left SI joint pain 3 day h/o  disabling left sI joint pain limiting safe mobility, at times radiaiting to left groin and thigh, has had 2 ED visit Depo medrol 120 mg im and pred 10 mg dose pack x 6 days, pt to call in 48 hrs if no  Better will refer to Dr Charlestine Night  Encounter for examination following treatment at hospital Ongoing pain x 1 week, despite 2 ED visits. Pain now localized over left SI joint, and radiating somewhat. X rays of left sI joint and left hip  depo medrol 120 mg im and pred 10 mg dose pack Direct SI joint injections not available locally by radiology. Pt to call in 48 hrs if not improved, will refer to rheumatology  Acute left-sided back pain 1 week history, no improvement despite 2 ED visits, steroids with zantac to be added  ESSENTIAL HYPERTENSION, BENIGN Controlled, no change in medication

## 2016-05-22 NOTE — Progress Notes (Signed)
No answer to phone and no answering machine.

## 2016-06-06 NOTE — Telephone Encounter (Signed)
Pt seen in office on 05/21/2016

## 2016-06-09 ENCOUNTER — Other Ambulatory Visit: Payer: Self-pay | Admitting: Family Medicine

## 2016-07-24 DIAGNOSIS — H524 Presbyopia: Secondary | ICD-10-CM | POA: Diagnosis not present

## 2016-07-24 DIAGNOSIS — H35023 Exudative retinopathy, bilateral: Secondary | ICD-10-CM | POA: Diagnosis not present

## 2016-07-24 LAB — HM DIABETES EYE EXAM

## 2016-07-25 DIAGNOSIS — I10 Essential (primary) hypertension: Secondary | ICD-10-CM | POA: Diagnosis not present

## 2016-07-25 DIAGNOSIS — E559 Vitamin D deficiency, unspecified: Secondary | ICD-10-CM | POA: Diagnosis not present

## 2016-07-25 DIAGNOSIS — E785 Hyperlipidemia, unspecified: Secondary | ICD-10-CM | POA: Diagnosis not present

## 2016-07-25 DIAGNOSIS — E1121 Type 2 diabetes mellitus with diabetic nephropathy: Secondary | ICD-10-CM | POA: Diagnosis not present

## 2016-07-25 LAB — HEMOGLOBIN A1C
Hgb A1c MFr Bld: 6.4 % — ABNORMAL HIGH (ref <5.7)
MEAN PLASMA GLUCOSE: 137 mg/dL

## 2016-07-26 LAB — COMPLETE METABOLIC PANEL WITH GFR
ALBUMIN: 4.1 g/dL (ref 3.6–5.1)
ALT: 12 U/L (ref 6–29)
AST: 18 U/L (ref 10–35)
Alkaline Phosphatase: 50 U/L (ref 33–130)
BUN: 12 mg/dL (ref 7–25)
CALCIUM: 9.1 mg/dL (ref 8.6–10.4)
CO2: 25 mmol/L (ref 20–31)
Chloride: 108 mmol/L (ref 98–110)
Creat: 0.98 mg/dL — ABNORMAL HIGH (ref 0.60–0.93)
GFR, EST NON AFRICAN AMERICAN: 57 mL/min — AB (ref >=60)
GFR, Est African American: 66 mL/min (ref >=60)
GLUCOSE: 90 mg/dL (ref 65–99)
POTASSIUM: 4.4 mmol/L (ref 3.5–5.3)
SODIUM: 144 mmol/L (ref 135–146)
Total Bilirubin: 0.4 mg/dL (ref 0.2–1.2)
Total Protein: 7 g/dL (ref 6.1–8.1)

## 2016-07-26 LAB — TSH: TSH: 1.11 mIU/L

## 2016-07-26 LAB — LIPID PANEL
CHOL/HDL RATIO: 2.8 ratio (ref <=5.0)
CHOLESTEROL: 144 mg/dL (ref 125–200)
HDL: 51 mg/dL (ref >=46)
LDL CALC: 70 mg/dL (ref <130)
Triglycerides: 115 mg/dL (ref <150)
VLDL: 23 mg/dL (ref <30)

## 2016-07-26 LAB — VITAMIN D 25 HYDROXY (VIT D DEFICIENCY, FRACTURES): Vit D, 25-Hydroxy: 33 ng/mL (ref 30–100)

## 2016-07-28 ENCOUNTER — Encounter: Payer: Self-pay | Admitting: Family Medicine

## 2016-07-28 ENCOUNTER — Ambulatory Visit (INDEPENDENT_AMBULATORY_CARE_PROVIDER_SITE_OTHER): Payer: Commercial Managed Care - HMO | Admitting: Family Medicine

## 2016-07-28 VITALS — BP 120/78 | HR 82 | Resp 14 | Ht 64.0 in | Wt 191.0 lb

## 2016-07-28 DIAGNOSIS — Z0000 Encounter for general adult medical examination without abnormal findings: Secondary | ICD-10-CM | POA: Diagnosis not present

## 2016-07-28 DIAGNOSIS — E785 Hyperlipidemia, unspecified: Secondary | ICD-10-CM

## 2016-07-28 DIAGNOSIS — M5090 Cervical disc disorder, unspecified, unspecified cervical region: Secondary | ICD-10-CM | POA: Diagnosis not present

## 2016-07-28 DIAGNOSIS — E1121 Type 2 diabetes mellitus with diabetic nephropathy: Secondary | ICD-10-CM

## 2016-07-28 DIAGNOSIS — Z1211 Encounter for screening for malignant neoplasm of colon: Secondary | ICD-10-CM | POA: Diagnosis not present

## 2016-07-28 DIAGNOSIS — Z23 Encounter for immunization: Secondary | ICD-10-CM | POA: Diagnosis not present

## 2016-07-28 DIAGNOSIS — I10 Essential (primary) hypertension: Secondary | ICD-10-CM

## 2016-07-28 LAB — HEMOCCULT GUIAC POC 1CARD (OFFICE): Fecal Occult Blood, POC: NEGATIVE

## 2016-07-28 MED ORDER — IBUPROFEN 800 MG PO TABS: 800.0000 mg | ORAL_TABLET | Freq: Three times a day (TID) | ORAL | 0 refills | Status: DC

## 2016-07-28 MED ORDER — METHYLPREDNISOLONE ACETATE 80 MG/ML IJ SUSP
80.0000 mg | Freq: Once | INTRAMUSCULAR | Status: AC
  Administered 2016-07-28: 80 mg via INTRAMUSCULAR

## 2016-07-28 MED ORDER — KETOROLAC TROMETHAMINE 60 MG/2ML IM SOLN
60.0000 mg | Freq: Once | INTRAMUSCULAR | Status: AC
  Administered 2016-07-28: 60 mg via INTRAMUSCULAR

## 2016-07-28 MED ORDER — CYCLOBENZAPRINE HCL 10 MG PO TABS: 10.0000 mg | ORAL_TABLET | Freq: Three times a day (TID) | ORAL | 0 refills | Status: DC | PRN

## 2016-07-28 NOTE — Assessment & Plan Note (Signed)

## 2016-07-28 NOTE — Assessment & Plan Note (Signed)
Uncontrolled.Toradol and depo medrol administered IM in the office , to be followed by a short course of  NSAIDS.and flexeril

## 2016-07-28 NOTE — Patient Instructions (Addendum)
Annual wellness April 6 or after, call if you need me sooner  Excellent labs.  Toradol and depo medrol in office for left neck pain, and ibuprofen , and flexeril prescribed  Influenza vaccine today  CBC, fasting  Lipid, cmp and EGFr and HBa1C in April  Please work on good  health habits so that your health will improve. 1. Commitment to daily physical activity for 30 to 60  minutes, if you are able to do this.  2. Commitment to wise food choices. Aim for half of your  food intake to be vegetable and fruit, one quarter starchy foods, and one quarter protein. Try to eat on a regular schedule  3 meals per day, snacking between meals should be limited to vegetables or fruits or small portions of nuts. 64 ounces of water per day is generally recommended, unless you have specific health conditions, like heart failure or kidney failure where you will need to limit fluid intake.  3. Commitment to sufficient and a  good quality of physical and mental rest daily, generally between 6 to 8 hours per day.  WITH PERSISTANCE AND PERSEVERANCE, THE IMPOSSIBLE , BECOMES THE NORM! Thank you  for choosing [facility]. We consider it a privelige to serve you.  Delivering excellent health care in a caring and  compassionate way is our goal.  Partnering with you,  so that together we can achieve this goal is our strategy.

## 2016-07-28 NOTE — Assessment & Plan Note (Signed)
After obtaining informed consent, the vaccine is  administered by LPN.  

## 2016-08-03 ENCOUNTER — Encounter: Payer: Self-pay | Admitting: Family Medicine

## 2016-08-03 NOTE — Progress Notes (Signed)
    Jillian [name]     MRN: [id number]      DOB: [birth year]  HPI: Patient is in for annual physical exam. 2 week h/o left neck pain which limits mobility on turning toward the affected side, no specific aggravating trauma Recent labs, if available are reviewed. Immunization is reviewed , and  updated if needed.   PE:  BP 120/78   Pulse 82   Resp 14   Ht 5\' 4"  (1.626 m)   Wt 191 lb (86.6 kg)   SpO2 99%   BMI 32.79 kg/m   Pleasant  female, alert and oriented x 3, in no cardio-pulmonary distress. Afebrile. HEENT No facial trauma or asymetry. Sinuses non tender.  Extra occullar muscles intact, External ears normal, tympanic membranes clear. Oropharynx moist, no exudate. Neck: decreased ROM with left neck spasm, no adenopathy,JVD or thyromegaly.No bruits.  Chest: Clear to ascultation bilaterally.No crackles or wheezes. Non tender to palpation  Breast: No asymetry,no masses or lumps. No tenderness. No nipple discharge or inversion. No axillary or supraclavicular adenopathy  Cardiovascular system; Heart sounds normal,  S1 and  S2 ,no S3.  No murmur, or thrill. Apical beat not displaced Peripheral pulses normal.  Abdomen: Soft, non tender, no organomegaly or masses. No bruits. Bowel sounds normal. No guarding, tenderness or rebound.  Rectal:  Normal sphincter tone. No rectal mass. Guaiac negative stool.  GU: External genitalia normal female genitalia , normal female distribution of hair. No lesions. Urethral meatus normal in size, no  Prolapse, no lesions visibly  Present. Bladder non tender. Vagina pink and moist , with no visible lesions , discharge present . Adequate pelvic support no  cystocele or rectocele noted Cervix pink and appears healthy, no lesions or ulcerations noted, no discharge noted from os Uterus normal size, no adnexal masses, no cervical motion or adnexal tenderness.   Musculoskeletal exam: Full ROM of spine, hips , shoulders and  knees. No deformity ,swelling or crepitus noted. No muscle wasting or atrophy.   Neurologic: Cranial nerves 2 to 12 intact. Power, tone ,sensation and reflexes normal throughout. No disturbance in gait. No tremor.  Skin: Intact, no ulceration, erythema , scaling or rash noted. Pigmentation normal throughout  Psych; Normal mood and affect. Judgement and concentration normal   Assessment & Plan:  Annual physical exam Annual exam as documented. Counseling done  re healthy lifestyle involving commitment to 150 minutes exercise per week, heart healthy diet, and attaining healthy weight.The importance of adequate sleep also discussed. Regular seat belt use and home safety, is also discussed. Changes in health habits are decided on by the patient with goals and time frames  set for achieving them. Immunization and cancer screening needs are specifically addressed at this visit.   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

## 2016-08-11 ENCOUNTER — Other Ambulatory Visit: Payer: Self-pay | Admitting: Family Medicine

## 2016-08-13 ENCOUNTER — Encounter: Payer: Self-pay | Admitting: Family Medicine

## 2016-08-13 ENCOUNTER — Ambulatory Visit (INDEPENDENT_AMBULATORY_CARE_PROVIDER_SITE_OTHER): Payer: Commercial Managed Care - HMO | Admitting: Family Medicine

## 2016-08-13 VITALS — BP 104/66 | HR 78 | Temp 98.8°F | Resp 16 | Ht 64.0 in | Wt 191.0 lb

## 2016-08-13 DIAGNOSIS — J069 Acute upper respiratory infection, unspecified: Secondary | ICD-10-CM | POA: Diagnosis not present

## 2016-08-13 MED ORDER — FLUNISOLIDE 25 MCG/ACT (0.025%) NA SOLN: 2.0000 | Freq: Two times a day (BID) | NASAL | 0 refills | Status: DC

## 2016-08-13 NOTE — Patient Instructions (Addendum)
May take tylenol or ibuprofen for the headache Use the nasal spray for the sinus congestion Call if not better by Monday   Viral Respiratory Infection Introduction A viral respiratory infection is an illness that affects parts of the body used for breathing, like the lungs, nose, and throat. It is caused by a germ called a virus. Some examples of this kind of infection are:  A cold.  The flu (influenza).  A respiratory syncytial virus (RSV) infection. How do I know if I have this infection? Most of the time this infection causes:  A stuffy or runny nose.  Yellow or green fluid in the nose.  A cough.  Sneezing.  Tiredness (fatigue).  Achy muscles.  A sore throat.  Sweating or chills.  A fever.  A headache. How is this infection treated? If the flu is diagnosed early, it may be treated with an antiviral medicine. This medicine shortens the length of time a person has symptoms. Symptoms may be treated with over-the-counter and prescription medicines, such as:  Expectorants. These make it easier to cough up mucus.  Decongestant nasal sprays. Doctors do not prescribe antibiotic medicines for viral infections. They do not work with this kind of infection. How do I know if I should stay home? To keep others from getting sick, stay home if you have:  A fever.  A lasting cough.  A sore throat.  A runny nose.  Sneezing.  Muscles aches.  Headaches.  Tiredness.  Weakness.  Chills.  Sweating.  An upset stomach (nausea). Follow these instructions at home:  Rest as much as possible.  Take over-the-counter and prescription medicines only as told by your doctor.  Drink enough fluid to keep your pee (urine) clear or pale yellow.  Gargle with salt water. Do this 3-4 times per day or as needed. To make a salt-water mixture, dissolve -1 tsp of salt in 1 cup of warm water. Make sure the salt dissolves all the way.  Use nose drops made from salt water. This  helps with stuffiness (congestion). It also helps soften the skin around your nose.  Do not drink alcohol.  Do not use tobacco products, including cigarettes, chewing tobacco, and e-cigarettes. If you need help quitting, ask your doctor. Get help if:  Your symptoms last for 10 days or longer.  Your symptoms get worse over time.  You have a fever.  You have very bad pain in your face or forehead.  Parts of your jaw or neck become very swollen. Get help right away if:  You feel pain or pressure in your chest.  You have shortness of breath.  You faint or feel like you will faint.  You keep throwing up (vomiting).  You feel confused. This information is not intended to replace advice given to you by your health care provider. Make sure you discuss any questions you have with your health care provider. Document Released: 08/21/2008 Document Revised: 02/14/2016 Document Reviewed: 02/14/2015  2017 [employer]vier

## 2016-08-13 NOTE — Progress Notes (Signed)
Subjective:     Jillian [name] is a 75 y.o. female who presents for evaluation of symptoms of a URI. Symptoms include congestion, facial pain, headache described as "pressure on head", nasal congestion, nausea with vomiting, non productive cough, post nasal drip and sinus pressure. Onset of symptoms was 3 days ago, and has been unchanged since that time. Treatment to date: alka seltzer plus without help.  The following portions of the patient's history were reviewed and updated as appropriate: past family history, past social history and past surgical history.  Review of Systems Pertinent items are noted in HPI.   Objective:    BP 104/66 (BP Location: Right Arm, Patient Position: Sitting, Cuff Size: Normal)   Pulse 78   Temp 98.8 F (37.1 C) (Oral)   Resp 16   Ht 5\' 4"  (1.626 m)   Wt 191 lb 0.6 oz (86.7 kg)   SpO2 100%   BMI 32.79 kg/m  General appearance: alert, appears stated age and no distress Head: Normocephalic, without obvious abnormality, atraumatic Eyes: negative findings: lids and lashes normal and conjunctivae and sclerae normal Ears: normal TM's and external ear canals both ears Nose: clear discharge, mild congestion, no sinus tenderness Throat: lips, mucosa, and tongue normal; teeth and gums normal Neck: no adenopathy, supple, symmetrical, trachea midline and thyroid not enlarged, symmetric, no tenderness/mass/nodules Back: negative, symmetric, no curvature. ROM normal. No CVA tenderness. Lungs: clear to auscultation bilaterally Heart: regular rate and rhythm, S1, S2 normal, no murmur, click, rub or gallop Abdomen: normal findings: bowel sounds normal and soft, non-tender Extremities: extremities normal, atraumatic, no cyanosis or edema   Assessment:    viral upper respiratory illness   Plan:    Discussed diagnosis and treatment of URI. Suggested symptomatic OTC remedies. Nasal saline spray for congestion. Follow up as needed.    Patient Instructions  May  take tylenol or ibuprofen for the headache Use the nasal spray for the sinus congestion Call if not better by Monday   Viral Respiratory Infection Introduction A viral respiratory infection is an illness that affects parts of the body used for breathing, like the lungs, nose, and throat. It is caused by a germ called a virus. Some examples of this kind of infection are:  A cold.  The flu (influenza).  A respiratory syncytial virus (RSV) infection. How do I know if I have this infection? Most of the time this infection causes:  A stuffy or runny nose.  Yellow or green fluid in the nose.  A cough.  Sneezing.  Tiredness (fatigue).  Achy muscles.  A sore throat.  Sweating or chills.  A fever.  A headache. How is this infection treated? If the flu is diagnosed early, it may be treated with an antiviral medicine. This medicine shortens the length of time a person has symptoms. Symptoms may be treated with over-the-counter and prescription medicines, such as:  Expectorants. These make it easier to cough up mucus.  Decongestant nasal sprays. Doctors do not prescribe antibiotic medicines for viral infections. They do not work with this kind of infection. How do I know if I should stay home? To keep others from getting sick, stay home if you have:  A fever.  A lasting cough.  A sore throat.  A runny nose.  Sneezing.  Muscles aches.  Headaches.  Tiredness.  Weakness.  Chills.  Sweating.  An upset stomach (nausea). Follow these instructions at home:  Rest as much as possible.  Take over-the-counter and prescription medicines  only as told by your doctor.  Drink enough fluid to keep your pee (urine) clear or pale yellow.  Gargle with salt water. Do this 3-4 times per day or as needed. To make a salt-water mixture, dissolve -1 tsp of salt in 1 cup of warm water. Make sure the salt dissolves all the way.  Use nose drops made from salt water. This helps  with stuffiness (congestion). It also helps soften the skin around your nose.  Do not drink alcohol.  Do not use tobacco products, including cigarettes, chewing tobacco, and e-cigarettes. If you need help quitting, ask your doctor. Get help if:  Your symptoms last for 10 days or longer.  Your symptoms get worse over time.  You have a fever.  You have very bad pain in your face or forehead.  Parts of your jaw or neck become very swollen. Get help right away if:  You feel pain or pressure in your chest.  You have shortness of breath.  You faint or feel like you will faint.  You keep throwing up (vomiting).  You feel confused. This information is not intended to replace advice given to you by your health care provider. Make sure you discuss any questions you have with your health care provider. Document Released: 08/21/2008 Document Revised: 02/14/2016 Document Reviewed: 02/14/2015  2017 [employer]vier

## 2016-08-15 ENCOUNTER — Emergency Department (HOSPITAL_COMMUNITY)
Admission: EM | Admit: 2016-08-15 | Discharge: 2016-08-15 | Disposition: A | Discharge status: Home / Self Care | Payer: Commercial Managed Care - HMO | Attending: Emergency Medicine | Admitting: Emergency Medicine

## 2016-08-15 ENCOUNTER — Encounter (HOSPITAL_COMMUNITY): Payer: Self-pay | Admitting: Emergency Medicine

## 2016-08-15 DIAGNOSIS — E119 Type 2 diabetes mellitus without complications: Secondary | ICD-10-CM | POA: Insufficient documentation

## 2016-08-15 DIAGNOSIS — Z7984 Long term (current) use of oral hypoglycemic drugs: Secondary | ICD-10-CM | POA: Insufficient documentation

## 2016-08-15 DIAGNOSIS — J0100 Acute maxillary sinusitis, unspecified: Secondary | ICD-10-CM

## 2016-08-15 DIAGNOSIS — Z7982 Long term (current) use of aspirin: Secondary | ICD-10-CM | POA: Diagnosis not present

## 2016-08-15 DIAGNOSIS — I10 Essential (primary) hypertension: Secondary | ICD-10-CM | POA: Insufficient documentation

## 2016-08-15 DIAGNOSIS — Z79899 Other long term (current) drug therapy: Secondary | ICD-10-CM | POA: Diagnosis not present

## 2016-08-15 DIAGNOSIS — R0981 Nasal congestion: Secondary | ICD-10-CM | POA: Diagnosis present

## 2016-08-15 MED ORDER — AMOXICILLIN 500 MG PO CAPS: 500.0000 mg | ORAL_CAPSULE | Freq: Three times a day (TID) | ORAL | 0 refills | Status: DC

## 2016-08-15 NOTE — ED Provider Notes (Signed)
[facility] DEPT Provider Note   CSN: [id number] Arrival date & time: 08/15/16  1735     History   Chief Complaint Chief Complaint  Patient presents with  . Nasal Congestion    HPI Jillian [name] is a 75 y.o. female.  The history is provided by the patient. No language interpreter was used.  Cough  This is a new problem. The problem occurs constantly. The problem has been gradually worsening. The cough is productive of sputum. There has been no fever. Associated symptoms include rhinorrhea. She has tried decongestants and mist for the symptoms. The treatment provided no relief. She is not a smoker. Her past medical history does not include pneumonia.  Pt complains of sinus drainage.  Pt reports thick mucus in the pack of her throat.  Pt reports continued nasal congestion.    Past Medical History:  Diagnosis Date  . Chronic fatigue   . Diabetes mellitus 2011  . DJD (degenerative joint disease)    of neck    . Dyslipidemia   . Hypertension 2005  . Mild obesity   . Postmenopausal   . Wears glasses     Patient Active Problem List   Diagnosis Date Noted  . Chronic left SI joint pain 05/21/2016  . Annual physical exam 06/26/2015  . Primary osteoarthritis of both knees 06/26/2015  . 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 Thoracic spine pain 09/28/2014  . Need for prophylactic vaccination and inoculation against influenza 06/04/2014  . Hip pain 08/06/2012  . 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 Hot flashes, menopausal 09/10/2011  . 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 INSOMNIA, CHRONIC 11/28/2009  . Hyperlipidemia with target LDL less than 100 10/12/2007  . Obesity (BMI 30.0-34.9) 10/12/2007  . ESSENTIAL HYPERTENSION, BENIGN 10/12/2007    Past Surgical History:  Procedure Laterality Date  . ABDOMINAL HYSTERECTOMY  1992   fibroids  . APPENDECTOMY    . BREAST LUMPECTOMY WITH RADIOACTIVE SEED LOCALIZATION  Right 10/05/2014   Procedure: BREAST LUMPECTOMY WITH RADIOACTIVE SEED LOCALIZATION;  Surgeon: Erroll Luna, MD;  Location: [facility];  Service: General;  Laterality: Right;  . cataract Bilateral   . CYST EXCISION Right    arm  . EYE SURGERY Right 06/07/2013   cataract  . EYE SURGERY Left 09/14/2013   cataract  . KNEE ARTHROSCOPY Right 02/26/2016   Procedure: ARTHROSCOPY RIGHT KNEE WITH MENSICAL DEBRIDEMENT;  Surgeon: Gaynelle Arabian, MD;  Location: [facility];  Service: Orthopedics;  Laterality: Right;  . left breast biopsy    . LESION REMOVAL Right 03/30/2013   Procedure: EXCISION OF NEOPLASM ARM;  Surgeon: Jamesetta So, MD;  Location: [facility];  Service: General;  Laterality: Right;  . TUBAL LIGATION      OB History    Gravida Para Term Preterm AB Living   2 2 2          SAB TAB Ectopic Multiple Live Births                   Home Medications    Prior to Admission medications   Medication Sig Start Date End Date Taking? Authorizing Provider  acetaminophen (TYLENOL) 500 MG tablet Take 1,000 mg by mouth every 6 (six) hours as needed for mild pain.   Yes Historical Provider, MD  aspirin EC 81 MG tablet Take 1 tablet (81 mg total) by mouth every evening. Patient taking differently: Take 81 mg by mouth every morning.  04/23/16  Yes Fayrene Helper, MD  benazepril (LOTENSIN) 10 MG tablet Take 1 tablet (10 mg total) by mouth daily. 04/23/16  Yes Fayrene Helper, MD  Calcium Carbonate-Vitamin D (OS-CAL 250 + D PO) Take 1 tablet by mouth daily.   Yes Historical Provider, MD  cloNIDine (CATAPRES) 0.1 MG tablet TAKE 1 TABLET AT BEDTIME Patient taking differently: TAKE 1 TABLET BY MOUTH DAILY 08/12/16  Yes Fayrene Helper, MD  cyclobenzaprine (FLEXERIL) 10 MG tablet Take 1 tablet (10 mg total) by mouth 3 (three) times daily as needed for muscle spasms. 07/28/16  Yes Fayrene Helper, MD  flunisolide (NASALIDE) 25 MCG/ACT (0.025%) SOLN Place 2 sprays into the nose 2 (two) times  daily. 08/13/16  Yes Raylene Everts, MD  ibuprofen (ADVIL,MOTRIN) 800 MG tablet Take 1 tablet (800 mg total) by mouth 3 (three) times daily. 07/28/16  Yes Fayrene Helper, MD  metFORMIN (GLUCOPHAGE-XR) 500 MG 24 hr tablet TAKE 1 TABLET TWICE DAILY 08/12/16  Yes Fayrene Helper, MD  pravastatin (PRAVACHOL) 10 MG tablet Take 10 mg by mouth daily.  12/27/15  Yes Historical Provider, MD  tamoxifen (NOLVADEX) 20 MG tablet Take 20 mg by mouth daily.  01/29/16  Yes Historical Provider, MD    Family History Family History  Problem Relation Age of Onset  . Heart failure Father   . Hypertension Father   . Cancer Mother     liver cancer  . Hypertension Sister   . Hypertension Brother   . Diabetes Brother     Social History Social History  Substance Use Topics  . Smoking status: Never Smoker  . Smokeless tobacco: Never Used  . Alcohol use No     Allergies   Codeine and Propoxyphene n-acetaminophen   Review of Systems Review of Systems  HENT: Positive for rhinorrhea.   Respiratory: Positive for cough.   All other systems reviewed and are negative.    Physical Exam Updated Vital Signs BP 123/76 (BP Location: Right Arm)   Pulse 87   Temp 97.6 F (36.4 C) (Oral)   Resp 16   Ht 5\' 4"  (1.626 m)   Wt 86.6 kg   SpO2 100%   BMI 32.79 kg/m   Physical Exam  Constitutional: She is oriented to person, place, and time. She appears well-developed and well-nourished.  HENT:  Head: Normocephalic and atraumatic.  Mouth/Throat: Posterior oropharyngeal erythema present.  Tender maxillary sinuses,  Eyes: Conjunctivae and EOM are normal. Pupils are equal, round, and reactive to light.  Neck: Normal range of motion. Neck supple.  Cardiovascular: Normal rate and regular rhythm.   Pulmonary/Chest: Effort normal.  Abdominal: Soft.  Musculoskeletal: Normal range of motion.  Neurological: She is alert and oriented to person, place, and time.  Skin: Skin is warm and dry.  Psychiatric:  She has a normal mood and affect.     ED Treatments / Results  Labs (all labs ordered are listed, but only abnormal results are displayed) Labs Reviewed - No data to display  EKG  EKG Interpretation None       Radiology No results found.  Procedures Procedures (including critical care time)  Medications Ordered in ED Medications - No data to display   Initial Impression / Assessment and Plan / ED Course  I have reviewed the triage vital signs and the nursing notes.  Pertinent labs & imaging results that were available during my care of the patient were reviewed by me and considered in my medical  decision making (see chart for details).  Clinical Course       Final Clinical Impressions(s) / ED Diagnoses   Final diagnoses:  Acute non-recurrent maxillary sinusitis    New Prescriptions New Prescriptions   AMOXICILLIN (AMOXIL) 500 MG CAPSULE    Take 1 capsule (500 mg total) by mouth 3 (three) times daily.  An After Visit Summary was printed and given to the patient.   Hollace Kinnier [place], PA-C 08/15/16 [place], MD 08/21/16 [id number]

## 2016-08-15 NOTE — Discharge Instructions (Signed)
See your Physicain for recheck in 1 week,

## 2016-08-15 NOTE — ED Triage Notes (Signed)
Pt reports nasal congestion and sneezing, small amount of nose bleeding. Pt was seen by PCP on Wed and given nasal spray, dx with URI. Pt states she is no better today.

## 2016-08-29 ENCOUNTER — Emergency Department (HOSPITAL_COMMUNITY)
Admission: EM | Admit: 2016-08-29 | Discharge: 2016-08-29 | Disposition: A | Discharge status: Home / Self Care | Payer: Commercial Managed Care - HMO | Attending: Emergency Medicine | Admitting: Emergency Medicine

## 2016-08-29 ENCOUNTER — Encounter (HOSPITAL_COMMUNITY): Payer: Self-pay | Admitting: Emergency Medicine

## 2016-08-29 DIAGNOSIS — Y939 Activity, unspecified: Secondary | ICD-10-CM | POA: Insufficient documentation

## 2016-08-29 DIAGNOSIS — S29012A Strain of muscle and tendon of back wall of thorax, initial encounter: Secondary | ICD-10-CM | POA: Insufficient documentation

## 2016-08-29 DIAGNOSIS — Z7982 Long term (current) use of aspirin: Secondary | ICD-10-CM | POA: Insufficient documentation

## 2016-08-29 DIAGNOSIS — S299XXA Unspecified injury of thorax, initial encounter: Secondary | ICD-10-CM | POA: Diagnosis present

## 2016-08-29 DIAGNOSIS — X58XXXA Exposure to other specified factors, initial encounter: Secondary | ICD-10-CM | POA: Diagnosis not present

## 2016-08-29 DIAGNOSIS — E119 Type 2 diabetes mellitus without complications: Secondary | ICD-10-CM | POA: Diagnosis not present

## 2016-08-29 DIAGNOSIS — Y929 Unspecified place or not applicable: Secondary | ICD-10-CM | POA: Diagnosis not present

## 2016-08-29 DIAGNOSIS — Y999 Unspecified external cause status: Secondary | ICD-10-CM | POA: Diagnosis not present

## 2016-08-29 DIAGNOSIS — Z79899 Other long term (current) drug therapy: Secondary | ICD-10-CM | POA: Insufficient documentation

## 2016-08-29 DIAGNOSIS — I10 Essential (primary) hypertension: Secondary | ICD-10-CM | POA: Insufficient documentation

## 2016-08-29 DIAGNOSIS — Z7984 Long term (current) use of oral hypoglycemic drugs: Secondary | ICD-10-CM | POA: Diagnosis not present

## 2016-08-29 MED ORDER — ONDANSETRON HCL 4 MG PO TABS
4.0000 mg | ORAL_TABLET | Freq: Once | ORAL | Status: AC
  Administered 2016-08-29: 4 mg via ORAL
  Filled 2016-08-29: qty 1

## 2016-08-29 MED ORDER — CYCLOBENZAPRINE HCL 10 MG PO TABS: 10.0000 mg | ORAL_TABLET | Freq: Three times a day (TID) | ORAL | 0 refills | Status: DC

## 2016-08-29 MED ORDER — METHOCARBAMOL 500 MG PO TABS
500.0000 mg | ORAL_TABLET | Freq: Once | ORAL | Status: AC
  Administered 2016-08-29: 500 mg via ORAL
  Filled 2016-08-29: qty 1

## 2016-08-29 MED ORDER — HYDROCODONE-ACETAMINOPHEN 5-325 MG PO TABS
2.0000 | ORAL_TABLET | Freq: Once | ORAL | Status: AC
  Administered 2016-08-29: 2 via ORAL
  Filled 2016-08-29: qty 2

## 2016-08-29 MED ORDER — HYDROCODONE-ACETAMINOPHEN 5-325 MG PO TABS: 1.0000 | ORAL_TABLET | ORAL | 0 refills | Status: DC | PRN

## 2016-08-29 NOTE — ED Provider Notes (Signed)
[facility] DEPT Provider Note   CSN: [id number] Arrival date & time: 08/29/16  1209     History   Chief Complaint Chief Complaint  Patient presents with  . Back Pain    HPI Jillian [name] is a 75 y.o. female.  Patient is a 75 year old female who presents to the emergency department with a complaint of upper back pain.  The patient states that she has had this pain before. She was seen in the emergency department in November for similar pain. She states she got some relief from the medications that were given, but it never completely went away. The patient's family member states that the patient will not rest, or lower her back to rest. 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 The patient states that she can sometimes issues a heating pad or rest and the pain would go away. This time she states the pain was much worse. There was no loss of feeling in the upper extremity. There was no difficulty with walking, and no difficulty with balance on. There was no sudden on chest or back area pain. Has no difficulty with breathing. The patient does not feel short of breath, and has no difficulty with swallowing. She's never been diagnosed with aneurysm or other vascular related issues. She's not had any operations or procedures involving her chest.      Past Medical History:  Diagnosis Date  . Chronic fatigue   . Diabetes mellitus 2011  . DJD (degenerative joint disease)    of neck    . Dyslipidemia   . Hypertension 2005  . Mild obesity   . Postmenopausal   . Wears glasses     Patient Active Problem List   Diagnosis Date Noted  . Chronic left SI joint pain 05/21/2016  . Annual physical exam 06/26/2015  . Primary osteoarthritis of both knees 06/26/2015  . 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 Thoracic spine pain 09/28/2014  . Need for prophylactic vaccination and inoculation  against influenza 06/04/2014  . Hip pain 08/06/2012  . 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 Hot flashes, menopausal 09/10/2011  . Controlled diabetes mellitus with nephropathy ([place]) 01/08/2010  . INSOMNIA, CHRONIC 11/28/2009  . Hyperlipidemia with target LDL less than 100 10/12/2007  . Obesity (BMI 30.0-34.9) 10/12/2007  . ESSENTIAL HYPERTENSION, BENIGN 10/12/2007    Past Surgical History:  Procedure Laterality Date  . ABDOMINAL HYSTERECTOMY  1992   fibroids  . APPENDECTOMY    . BREAST LUMPECTOMY WITH RADIOACTIVE SEED LOCALIZATION Right 10/05/2014   Procedure: BREAST LUMPECTOMY WITH RADIOACTIVE SEED LOCALIZATION;  Surgeon: Erroll Luna, MD;  Location: [facility];  Service: General;  Laterality: Right;  . cataract Bilateral   . CYST EXCISION Right    arm  . EYE SURGERY Right 06/07/2013   cataract  . EYE SURGERY Left 09/14/2013   cataract  . KNEE ARTHROSCOPY Right 02/26/2016   Procedure: ARTHROSCOPY RIGHT KNEE WITH MENSICAL DEBRIDEMENT;  Surgeon: Gaynelle Arabian, MD;  Location: [facility];  Service: Orthopedics;  Laterality: Right;  . left breast biopsy    . LESION REMOVAL Right 03/30/2013   Procedure: EXCISION OF NEOPLASM ARM;  Surgeon: Jamesetta So, MD;  Location: [facility];  Service: General;  Laterality: Right;  . TUBAL LIGATION      OB History    Gravida Para Term Preterm AB Living  2 2 2          SAB TAB Ectopic Multiple Live Births                   Home Medications    Prior to Admission medications   Medication Sig Start Date End Date Taking? Authorizing Provider  acetaminophen (TYLENOL) 500 MG tablet Take 1,000 mg by mouth every 6 (six) hours as needed for mild pain.    Historical Provider, MD  amoxicillin (AMOXIL) 500 MG capsule Take 1 capsule (500 mg total) by mouth 3 (three) times daily. 08/15/16   Fransico Meadow, PA-C  aspirin EC 81 MG tablet Take 1 tablet (81 mg total) by mouth every evening. Patient taking differently: Take  81 mg by mouth every morning.  04/23/16   Fayrene Helper, MD  benazepril (LOTENSIN) 10 MG tablet Take 1 tablet (10 mg total) by mouth daily. 04/23/16   Fayrene Helper, MD  Calcium Carbonate-Vitamin D (OS-CAL 250 + D PO) Take 1 tablet by mouth daily.    Historical Provider, MD  cloNIDine (CATAPRES) 0.1 MG tablet TAKE 1 TABLET AT BEDTIME Patient taking differently: TAKE 1 TABLET BY MOUTH DAILY 08/12/16   Fayrene Helper, MD  cyclobenzaprine (FLEXERIL) 10 MG tablet Take 1 tablet (10 mg total) by mouth 3 (three) times daily as needed for muscle spasms. 07/28/16   Fayrene Helper, MD  flunisolide (NASALIDE) 25 MCG/ACT (0.025%) SOLN Place 2 sprays into the nose 2 (two) times daily. 08/13/16   Raylene Everts, MD  ibuprofen (ADVIL,MOTRIN) 800 MG tablet Take 1 tablet (800 mg total) by mouth 3 (three) times daily. 07/28/16   Fayrene Helper, MD  metFORMIN (GLUCOPHAGE-XR) 500 MG 24 hr tablet TAKE 1 TABLET TWICE DAILY 08/12/16   Fayrene Helper, MD  pravastatin (PRAVACHOL) 10 MG tablet Take 10 mg by mouth daily.  12/27/15   Historical Provider, MD  tamoxifen (NOLVADEX) 20 MG tablet Take 20 mg by mouth daily.  01/29/16   Historical Provider, MD    Family History Family History  Problem Relation Age of Onset  . Heart failure Father   . Hypertension Father   . Cancer Mother     liver cancer  . Hypertension Sister   . Hypertension Brother   . Diabetes Brother     Social History Social History  Substance Use Topics  . Smoking status: Never Smoker  . Smokeless tobacco: Never Used  . Alcohol use No     Allergies   Codeine and Propoxyphene n-acetaminophen   Review of Systems Review of Systems  Constitutional: Negative for activity change.       All ROS Neg except as noted in HPI  HENT: Negative for nosebleeds.   Eyes: Negative for photophobia and discharge.  Respiratory: Negative for cough, shortness of breath and wheezing.   Cardiovascular: Negative for chest pain and  palpitations.  Gastrointestinal: Negative for abdominal pain and blood in stool.  Genitourinary: Negative for dysuria, frequency and hematuria.  Musculoskeletal: Positive for arthralgias and back pain. Negative for neck pain.  Skin: Negative.   Neurological: Negative for dizziness, seizures and speech difficulty.  Psychiatric/Behavioral: Negative for confusion and hallucinations.     Physical Exam Updated Vital Signs BP 135/94 (BP Location: Left Arm)   Pulse 77   Temp 98.3 F (36.8 C) (Oral)   Resp 16   Ht 5\' 4"  (1.626 m)   Wt 86.6 kg   SpO2 100%   BMI 32.79 kg/m  Physical Exam  Constitutional: She is oriented to person, place, and time. She appears well-developed and well-nourished.  Non-toxic appearance.  HENT:  Head: Normocephalic.  Right Ear: Tympanic membrane and external ear normal.  Left Ear: Tympanic membrane and external ear normal.  Eyes: EOM and lids are normal. Pupils are equal, round, and reactive to light.  Neck: Normal range of motion. Neck supple. Carotid bruit is not present.  Cardiovascular: Normal rate, regular rhythm, normal heart sounds, intact distal pulses and normal pulses.  Exam reveals no friction rub.   No murmur heard. Pulmonary/Chest: Breath sounds normal. No respiratory distress.  Abdominal: Soft. Bowel sounds are normal. There is no tenderness. There is no guarding.  There is no mass or pulsating mass appreciated. No unusual bruit appreciated.  Musculoskeletal: Normal range of motion.       Thoracic back: She exhibits tenderness, pain and spasm.       Back:  Distal pulses of the upper and lower extremities are 2+ and symmetrical.  Lymphadenopathy:       Head (right side): No submandibular adenopathy present.       Head (left side): No submandibular adenopathy present.    She has no cervical adenopathy.  Neurological: She is alert and oriented to person, place, and time. She has normal strength. No cranial nerve deficit or sensory deficit.    Skin: Skin is warm and dry.  Psychiatric: She has a normal mood and affect. Her speech is normal.  Nursing note and vitals reviewed.    ED Treatments / Results  Labs (all labs ordered are listed, but only abnormal results are displayed) Labs Reviewed - No data to display  EKG  EKG Interpretation None       Radiology No results found.  Procedures Procedures (including critical care time)  Medications Ordered in ED Medications  methocarbamol (ROBAXIN) tablet 500 mg (500 mg Oral Given 08/29/16 1441)  HYDROcodone-acetaminophen (NORCO/VICODIN) 5-325 MG per tablet 2 tablet (2 tablets Oral Given 08/29/16 1441)  ondansetron (ZOFRAN) tablet 4 mg (4 mg Oral Given 08/29/16 1441)     Initial Impression / Assessment and Plan / ED Course  I have reviewed the triage vital signs and the nursing notes.  Pertinent labs & imaging results that were available during my care of the patient were reviewed by me and considered in my medical decision making (see chart for details).  Clinical Course     **I have reviewed nursing notes, vital signs, and all appropriate lab and imaging results for this patient.*  Final Clinical Impressions(s) / ED Diagnoses  Vital signs within normal limits. I can reproduce the pain with range of motion and certain movement involving the thoracic area or upper chest area. Is no reduction in distal pulses. There's been no significant drop in blood pressure. Doubt aneurysm. The patient gives no history of loss of consciousness, and has no problem with shortness of breath. I suspect the patient has a strain of the paraspinal muscles involving the thoracic spine area. I reviewed the previous thoracic spine x-rays. No evidence of compression fracture, there are noted arthritis changes. And some narrowed interspace hot areas appreciated. Patient will be treated with Flexeril and Norco. I've asked patient to use a heating pad. I've given the patient careful instructions on  using the narcotic pain medication and the muscle relaxer as they may cause drowsiness, or lightheadedness on. The patient acknowledges understanding of how to use them safely. The patient will see the primary physician or return to  the emergency department if any changes, problems, or concerns.    Final diagnoses:  None    New Prescriptions New Prescriptions   No medications on file     Lily Kocher, PA-C 08/29/16 [place], MD 08/29/16 [phone]

## 2016-08-29 NOTE — ED Notes (Signed)
Pt reports upper back pain, between shoulder blades, that started 4-5 days ago. 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 Hx of chronic back pain, denies any weakness in extremities.

## 2016-08-29 NOTE — Discharge Instructions (Signed)
Please use heating pad to the upper portion of your back. Use it for 20 minutes on, and 10 minutes off. Please use ibuprofen and Flexeril as previously prescribed. Use Norco for more severe pain. Norco and Flexeril may cause drowsiness or lightheadedness. Please use these medications with caution. Please rest your back is much as possible. Please see Dr. Moshe Cipro, or return to the emergency department if not improving.

## 2016-08-29 NOTE — ED Triage Notes (Signed)
Pt reports chronic back pain that became worse last night. Pt seen on 11/24 for same.

## 2016-09-01 ENCOUNTER — Encounter: Payer: Self-pay | Admitting: Family Medicine

## 2016-09-01 ENCOUNTER — Ambulatory Visit (INDEPENDENT_AMBULATORY_CARE_PROVIDER_SITE_OTHER): Payer: Commercial Managed Care - HMO | Admitting: Family Medicine

## 2016-09-01 VITALS — BP 120/84 | HR 82 | Resp 16 | Ht 64.0 in | Wt 192.0 lb

## 2016-09-01 DIAGNOSIS — M546 Pain in thoracic spine: Secondary | ICD-10-CM

## 2016-09-01 DIAGNOSIS — I10 Essential (primary) hypertension: Secondary | ICD-10-CM | POA: Diagnosis not present

## 2016-09-01 MED ORDER — METHYLPREDNISOLONE ACETATE 80 MG/ML IJ SUSP
80.0000 mg | Freq: Once | INTRAMUSCULAR | Status: AC
  Administered 2016-09-01: 80 mg via INTRAMUSCULAR

## 2016-09-01 MED ORDER — HYDROCODONE-ACETAMINOPHEN 5-325 MG PO TABS: ORAL_TABLET | ORAL | 0 refills | Status: DC

## 2016-09-01 MED ORDER — PREDNISONE 5 MG (21) PO TBPK: 5.0000 mg | ORAL_TABLET | ORAL | 0 refills | Status: DC

## 2016-09-01 MED ORDER — KETOROLAC TROMETHAMINE 60 MG/2ML IM SOLN
60.0000 mg | Freq: Once | INTRAMUSCULAR | Status: AC
  Administered 2016-09-01: 60 mg via INTRAMUSCULAR

## 2016-09-01 MED ORDER — IBUPROFEN 800 MG PO TABS: 800.0000 mg | ORAL_TABLET | Freq: Three times a day (TID) | ORAL | 0 refills | Status: DC | PRN

## 2016-09-01 NOTE — Assessment & Plan Note (Signed)
Uncontrolled.Toradol and depo medrol administered IM in the office , to be followed by a short course of oral prednisone and NSAIDS.  

## 2016-09-01 NOTE — Patient Instructions (Addendum)
F/u as before, call if you need me sooner  Toradol and depo medrol iM in office today for pain, and short course of ibuprofen and prednisone prescribed contune muscle relaxant  10 day supply of hydrocodone is prescribed for as needed bedtime use only  Thank you  for choosing [facility]. We consider it a privelige to serve you.  Delivering excellent health care in a caring and  compassionate way is our goal.  Partnering with you,  so that together we can achieve this goal is our strategy.

## 2016-09-04 ENCOUNTER — Other Ambulatory Visit: Payer: Self-pay

## 2016-09-04 NOTE — Patient Outreach (Signed)
[facility]) Care Management  09/04/2016  ILZE GERDEMAN 01/18/1941 [id number]   Telephone call to patient for high ED utilization referral. Female answered stating that patient not in.  Health Coach contact information provided.  Plan: RN Health Coach will attempt patient again in the next ten business days.  Jone Baseman, RN, MSN [place] [phone]

## 2016-09-07 NOTE — Assessment & Plan Note (Signed)
Controlled, no change in medication  

## 2016-09-07 NOTE — Progress Notes (Signed)
   ALFREIDA WOJTOWICZ     MRN: [id number]      DOB: [birth year]   HPI Jillian [name]  Patient in for follow up of recent ED visit. Discharge summary, and laboratory and radiology data are reviewed, and any questions or concerns about recent hospitalization are discussed. Specific issues requiring follow up are specifically addressed. States she has ongoing significant thoracic pain, feels symptoms started when she tried moving a resident, pain is constant and rated at 8, non radiating    ROS Denies recent fever or chills. Denies sinus pressure, nasal congestion, ear pain or sore throat. Denies chest congestion, productive cough or wheezing. Denies chest pains, palpitations and leg swelling Denies abdominal pain, nausea, vomiting,diarrhea or constipation.   Denies dysuria, frequency, hesitancy or incontinence.  Denies headaches, seizures, numbness, or tingling. Denies depression, anxiety . Denies skin break down or rash.   PE  BP 120/84   Pulse 82   Resp 16   Ht 5\' 4"  (1.626 m)   Wt 192 lb (87.1 kg)   SpO2 95%   BMI 32.96 kg/m   Patient alert and oriented and in no cardiopulmonary distress.  HEENT: No facial asymmetry, EOMI,   oropharynx pink and moist.  Neck supple no JVD, no mass.  Chest: Clear to auscultation bilaterally.  CVS: S1, S2 no murmurs, no S3.Regular rate.  ABD: Soft non tender.   Ext: No edema  MS: Decreased  ROM thoracic  Spine,adequate in  shoulders, hips and knees.  Skin: Intact, no ulcerations or rash noted.  Psych: Good eye contact, normal affect. Memory intact not anxious or depressed appearing.  CNS: CN 2-12 intact, power,  normal throughout.no focal deficits noted.   Assessment & Plan Thoracic spine pain Uncontrolled.Toradol and depo medrol administered IM in the office , to be followed by a short course of oral prednisone and NSAIDS.   ESSENTIAL HYPERTENSION, BENIGN Controlled, no change in medication

## 2016-09-08 ENCOUNTER — Other Ambulatory Visit: Payer: Self-pay

## 2016-09-08 NOTE — Patient Outreach (Signed)
[facility]) Care Management  09/08/2016  Jillian [name] 19-Dec-1940 [id number]   2nd Telephone call to patient for referral from high ED utilization.  Female answered stating patient not in. Health Coach information given.    Plan: RN Health Coach will attempt patient again in ten business days.    Jone Baseman, RN, MSN [place] [phone]

## 2016-09-11 ENCOUNTER — Other Ambulatory Visit: Payer: Self-pay

## 2016-09-11 NOTE — Patient Outreach (Signed)
[facility]) Care Management  09/11/2016  Jillian [name] 1940/10/05 [id number]   3rd telephone call to patient for High ED Utilization referral.  Female answers stating that patient is not in. Health Coach information given for return call.    Plan: RN Health Coach will send letter to attempt to reach patient.    Jone Baseman, RN, MSN [place] [phone]

## 2016-09-26 ENCOUNTER — Other Ambulatory Visit: Payer: Self-pay

## 2016-09-26 NOTE — Patient Outreach (Signed)
[facility]) Care Management  09/26/2016  Jillian [name] 10-Mar-1941 [id number]   Patient referral for high ED utilization.  Patient has not responded to calls or letter.  Will close case. Will notify Care management assistant of case status.  Jone Baseman, RN, MSN [place] [phone]

## 2016-10-29 ENCOUNTER — Other Ambulatory Visit: Payer: Self-pay | Admitting: Family Medicine

## 2016-10-29 ENCOUNTER — Telehealth: Payer: Self-pay | Admitting: Family Medicine

## 2016-10-29 ENCOUNTER — Other Ambulatory Visit: Payer: Self-pay | Admitting: Obstetrics

## 2016-10-29 DIAGNOSIS — Z9889 Other specified postprocedural states: Secondary | ICD-10-CM

## 2016-10-29 NOTE — Telephone Encounter (Signed)
USE AS A DX HX OF RT BREAST LUMPECTOMY IN 2016

## 2016-10-29 NOTE — Telephone Encounter (Signed)
Jillian [name] is trying to schedule a Diagnostic Mammo but no orders have been placed, please advise?

## 2016-10-31 ENCOUNTER — Other Ambulatory Visit: Payer: Self-pay | Admitting: Family Medicine

## 2016-10-31 DIAGNOSIS — Z9889 Other specified postprocedural states: Secondary | ICD-10-CM

## 2016-10-31 NOTE — Telephone Encounter (Signed)
NOTED

## 2016-11-12 ENCOUNTER — Encounter (HOSPITAL_COMMUNITY): Payer: Commercial Managed Care - HMO | Attending: Hematology & Oncology | Admitting: Oncology

## 2016-11-12 ENCOUNTER — Encounter (HOSPITAL_COMMUNITY): Payer: Self-pay

## 2016-11-12 VITALS — BP 105/65 | HR 71 | Temp 97.8°F | Resp 16 | Wt 194.0 lb

## 2016-11-12 DIAGNOSIS — N951 Menopausal and female climacteric states: Secondary | ICD-10-CM

## 2016-11-12 DIAGNOSIS — D649 Anemia, unspecified: Secondary | ICD-10-CM

## 2016-11-12 DIAGNOSIS — Z79810 Long term (current) use of selective estrogen receptor modulators (SERMs): Secondary | ICD-10-CM

## 2016-11-12 DIAGNOSIS — D241 Benign neoplasm of right breast: Secondary | ICD-10-CM

## 2016-11-12 NOTE — Progress Notes (Signed)
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 female is here for follow-up of atypical ductal hyperplasia.  She underwent a R breast lumpectomy with Dr. Brantley Stage on 10/05/2014. She has been underoing diagnostic mammography for ongoing follow-up of a 1.2 x 1.4 x 0.9 cm hypoechoic lobular mass in the R breast at the 2:30 position 6 cm from the nipple.   She has been doing well on tamoxifen. She does get night sweats, but they are tolerable. Her mammogram is scheduled for 11/18/16. She gets an annual eye exam and pap smear. She has not felt any breast masses during self examination at home. Pt has a lot of right lower extremity swelling. Denies abdominal pain, or any other concerns.   MEDICAL HISTORY:  Past Medical History:  Diagnosis Date   Chronic fatigue    Diabetes mellitus 2011   DJD (degenerative joint disease)    of neck     Dyslipidemia    Hypertension 2005   Mild obesity    Postmenopausal    Wears glasses     SURGICAL HISTORY: Past Surgical History:  Procedure Laterality Date   ABDOMINAL HYSTERECTOMY  1992   fibroids   APPENDECTOMY     BREAST LUMPECTOMY WITH RADIOACTIVE SEED LOCALIZATION Right 10/05/2014   Procedure: BREAST LUMPECTOMY WITH RADIOACTIVE SEED LOCALIZATION;  Surgeon: Erroll Luna, MD;  Location: [facility];  Service: General;  Laterality: Right;   cataract Bilateral    CYST EXCISION Right    arm   EYE SURGERY Right 06/07/2013   cataract   EYE SURGERY Left 09/14/2013   cataract   KNEE ARTHROSCOPY Right 02/26/2016   Procedure: ARTHROSCOPY RIGHT KNEE WITH MENSICAL DEBRIDEMENT;  Surgeon: Gaynelle Arabian, MD;  Location: [facility];  Service: Orthopedics;  Laterality: Right;   left breast biopsy     LESION REMOVAL Right 03/30/2013   Procedure: EXCISION OF NEOPLASM ARM;  Surgeon: Jamesetta So, MD;  Location: [facility];  Service: General;  Laterality: Right;   TUBAL LIGATION      SOCIAL HISTORY: Social History   Social History   Marital status: Married    Spouse name: N/A   Number of children: N/A   Years of education: N/A   Occupational History   Not on file.   Social History Main Topics   Smoking status: Never Smoker   Smokeless tobacco: Never Used   Alcohol use No   Drug use: No   Sexual activity: Not Currently    Birth control/ protection: Surgical   Other Topics Concern   Not on file   Social History Narrative   No narrative on file  The patient states she is "partially married"  Or "separated" she has 2 sons, 3 grandsons and 1 great grandson. She is a never smoker and does not drink ETOH. She has worked in [employer] and in [employer].  FAMILY HISTORY: Family History  Problem Relation Age of Onset   Heart failure Father    Hypertension Father    Cancer Mother     liver cancer   Hypertension Sister    Hypertension Brother  Diabetes Brother    indicated that her mother is deceased. She indicated that her father is deceased. She indicated that her sister is alive. She indicated that all of her three brothers are alive. She indicated that both of her sons are alive.   Mother died at 67 from liver cancer, Father died at 95 from CHF.  3 Brothers and 1 sister, 2 brothers have prostate cancer.   ALLERGIES:  is allergic to codeine and propoxyphene n-acetaminophen.  MEDICATIONS:  Current Outpatient Prescriptions  Medication Sig Dispense Refill   acetaminophen (TYLENOL) 500 MG tablet Take 1,000 mg by mouth every 6  (six) hours as needed for mild pain.     aspirin EC 81 MG tablet Take 1 tablet (81 mg total) by mouth every evening. (Patient taking differently: Take 81 mg by mouth every morning. ) 90 tablet 1   benazepril (LOTENSIN) 10 MG tablet Take 1 tablet (10 mg total) by mouth daily. 90 tablet 1   Calcium Carbonate-Vitamin D (OS-CAL 250 + D PO) Take 1 tablet by mouth daily.     cloNIDine (CATAPRES) 0.1 MG tablet TAKE 1 TABLET AT BEDTIME (Patient taking differently: TAKE 1 TABLET BY MOUTH DAILY) 90 tablet 0   HYDROcodone-acetaminophen (NORCO/VICODIN) 5-325 MG tablet Take 1 tablet by mouth every 4 (four) hours as needed. 15 tablet 0   HYDROcodone-acetaminophen (NORCO/VICODIN) 5-325 MG tablet One tablet at bedtime as needed, for uncontrolled back pain 10 tablet 0   ibuprofen (ADVIL,MOTRIN) 800 MG tablet Take 1 tablet (800 mg total) by mouth every 8 (eight) hours as needed. 21 tablet 0   metFORMIN (GLUCOPHAGE-XR) 500 MG 24 hr tablet TAKE 1 TABLET TWICE DAILY 180 tablet 0   pravastatin (PRAVACHOL) 10 MG tablet Take 10 mg by mouth daily.      predniSONE (STERAPRED UNI-PAK 21 TAB) 5 MG (21) TBPK tablet Take 1 tablet (5 mg total) by mouth as directed. Use as directed 21 tablet 0   tamoxifen (NOLVADEX) 20 MG tablet Take 20 mg by mouth daily.      No current facility-administered medications for this visit.     Review of Systems  Constitutional:       Night sweats  HENT: Negative.   Eyes: Negative.   Respiratory: Negative.   Cardiovascular: Positive for leg swelling (Right).  Gastrointestinal: Negative.  Negative for abdominal pain.  Genitourinary: Negative.   Musculoskeletal: Negative.   Skin: Negative.   Neurological: Negative.   Endo/Heme/Allergies: Negative.   Psychiatric/Behavioral: Negative.   All other systems reviewed and are negative. 14 point review of systems was performed and is negative except as detailed under history of present illness and above  PHYSICAL EXAMINATION: ECOG  PERFORMANCE STATUS: 0 - Asymptomatic  Vitals:   11/12/16 1131  BP: 105/65  Pulse: 71  Resp: 16  Temp: 97.8 F (36.6 C)   Filed Weights   11/12/16 1131  Weight: 194 lb (88 kg)   Physical Exam  Constitutional: She is oriented to person, place, and time and well-developed, well-nourished, and in no distress.  HENT:  Head: Normocephalic and atraumatic.  Eyes: EOM are normal. Pupils are equal, round, and reactive to light.  Neck: Normal range of motion. Neck supple.  Cardiovascular: Normal rate, regular rhythm and normal heart sounds.   Pulmonary/Chest: Effort normal and breath sounds normal.    Abdominal: Soft. Bowel sounds are normal.  Musculoskeletal: Normal range of motion.  Neurological: She is alert and oriented to person, place, and time. Gait normal.  Skin: Skin  is warm and dry.  Nursing note and vitals reviewed.  LABORATORY DATA:  I have reviewed the data as listed Lab Results  Component Value Date   WBC 9.2 03/31/2016   HGB 12.3 03/31/2016   HCT 38.7 03/31/2016   MCV 93.5 03/31/2016   PLT 183 03/31/2016     Chemistry      Component Value Date/Time   NA 144 07/25/2016 1114   K 4.4 07/25/2016 1114   CL 108 07/25/2016 1114   CO2 25 07/25/2016 1114   BUN 12 07/25/2016 1114   CREATININE 0.98 (H) 07/25/2016 1114      Component Value Date/Time   CALCIUM 9.1 07/25/2016 1114   ALKPHOS 50 07/25/2016 1114   AST 18 07/25/2016 1114   ALT 12 07/25/2016 1114   BILITOT 0.4 07/25/2016 1114      RADIOGRAPHIC STUDIES: I have personally reviewed the radiological images as listed in HPI and agreed with the findings in the report.  CT Abdomen Pelvis w/ Contrast 03/31/2016 IMPRESSION: 1. Mild acute diverticulitis of the sigmoid colon. 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 Brantley Stage on 10/05/2014 High Risk  Breast Disease Tamoxifen for chemoprevention Mild anemia Hot flashes not improved with Effexor or Gabapentin  Clinically NED on exam today.  Proceed with mammogram on 11/18/2016.   Continue tamoxifen. Advised patient to continue getting annual eye and gynecologic exams.  She will return for follow up in 6 months with CBC and CMP.   No orders of the defined types were placed in this encounter.   All questions were answered. The patient knows to call the clinic with any problems, questions or concerns.   This document serves as a record of services personally performed by Twana First, MD. It was created on her behalf by Martinique Casey, a trained medical scribe. The creation of this record is based on the scribe's personal observations and the provider's statements to them. This document has been checked and approved by the attending provider.  I have reviewed the above documentation for accuracy and completeness, and I agree with the above.  This note was electronically signed.

## 2016-11-12 NOTE — Patient Instructions (Signed)
[facility] at [facility] Discharge Instructions  RECOMMENDATIONS MADE BY THE CONSULTANT AND ANY TEST RESULTS WILL BE SENT TO YOUR REFERRING PHYSICIAN.  You were seen today by Dr. Louise Zhou Follow up in 6 months with labs See Amy up front for appointments   Thank you for choosing [facility] at [facility] to provide your oncology and hematology care.  To afford each patient quality time with our provider, please arrive at least 15 minutes before your scheduled appointment time.    If you have a lab appointment with the Cancer Center please come in thru the  Main Entrance and check in at the main information desk  You need to re-schedule your appointment should you arrive 10 or more minutes late.  We strive to give you quality time with our providers, and arriving late affects you and other patients whose appointments are after yours.  Also, if you no show three or more times for appointments you may be dismissed from the clinic at the providers discretion.     Again, thank you for choosing [facility].  Our hope is that these requests will decrease the amount of time that you wait before being seen by our physicians.       _____________________________________________________________  Should you have questions after your visit to [facility], please contact our office at [phone] between the hours of 8:30 a.m. and 4:30 p.m.  Voicemails left after 4:30 p.m. will not be returned until the following business day.  For prescription refill requests, have your pharmacy contact our office.       Resources For Cancer Patients and their Caregivers ? American Cancer Society: Can assist with transportation, wigs, general needs, runs Look Good Feel Better.        1-888-227-6333 ? Cancer Care: Provides financial assistance, online support groups, medication/co-pay assistance.  1-800-813-HOPE (4673) ? [facility] Assists [place] cancer patients and their families through emotional , educational and financial support.  336-427-4357 ? [place] DSS Where to apply for food stamps, Medicaid and utility assistance. 336-342-1394 ? [facility]: Transportation to medical appointments. 336-347-2287 ? Social Security Administration: May apply for disability if have a Stage IV cancer. 336-342-7796 1-800-772-1213 ? [place] Aging, Disability and Transit Services: Assists with nutrition, care and transit needs. 336-349-2343  Cancer Center Support Programs: @10RELATIVEDAYS@ > Cancer Support Group  2nd Tuesday of the month 1pm-2pm, Journey Room  > Creative Journey  3rd Tuesday of the month 1130am-1pm, Journey Room  > Look Good Feel Better  1st Wednesday of the month 10am-12 noon, Journey Room (Call American Cancer Society to register 1-800-395-5775)    

## 2016-11-18 ENCOUNTER — Ambulatory Visit (HOSPITAL_COMMUNITY)
Admission: RE | Admit: 2016-11-18 | Discharge: 2016-11-18 | Disposition: A | Discharge status: Home / Self Care | Payer: Medicare HMO | Source: Ambulatory Visit | Attending: Family Medicine | Admitting: Family Medicine

## 2016-11-18 ENCOUNTER — Encounter (HOSPITAL_COMMUNITY): Payer: Self-pay

## 2016-11-18 DIAGNOSIS — Z1231 Encounter for screening mammogram for malignant neoplasm of breast: Secondary | ICD-10-CM | POA: Diagnosis not present

## 2016-11-18 DIAGNOSIS — R928 Other abnormal and inconclusive findings on diagnostic imaging of breast: Secondary | ICD-10-CM | POA: Diagnosis not present

## 2016-11-18 DIAGNOSIS — Z9889 Other specified postprocedural states: Secondary | ICD-10-CM

## 2016-11-25 ENCOUNTER — Other Ambulatory Visit: Payer: Self-pay | Admitting: Family Medicine

## 2016-11-25 ENCOUNTER — Other Ambulatory Visit (HOSPITAL_COMMUNITY): Payer: Self-pay | Admitting: Hematology & Oncology

## 2016-12-31 DIAGNOSIS — E1121 Type 2 diabetes mellitus with diabetic nephropathy: Secondary | ICD-10-CM | POA: Diagnosis not present

## 2016-12-31 DIAGNOSIS — E785 Hyperlipidemia, unspecified: Secondary | ICD-10-CM | POA: Diagnosis not present

## 2016-12-31 DIAGNOSIS — I10 Essential (primary) hypertension: Secondary | ICD-10-CM | POA: Diagnosis not present

## 2016-12-31 LAB — CBC
HCT: 36.4 % (ref 35.0–45.0)
Hemoglobin: 11.6 g/dL — ABNORMAL LOW (ref 11.7–15.5)
MCH: 29.7 pg (ref 27.0–33.0)
MCHC: 31.9 g/dL — ABNORMAL LOW (ref 32.0–36.0)
MCV: 93.1 fL (ref 80.0–100.0)
MPV: 10.8 fL (ref 7.5–12.5)
PLATELETS: 170 10*3/uL (ref 140–400)
RBC: 3.91 MIL/uL (ref 3.80–5.10)
RDW: 14.3 % (ref 11.0–15.0)
WBC: 5.9 10*3/uL (ref 3.8–10.8)

## 2016-12-31 LAB — LIPID PANEL
CHOL/HDL RATIO: 2.5 ratio (ref <5.0)
CHOLESTEROL: 136 mg/dL (ref <200)
HDL: 55 mg/dL (ref >50)
LDL Cholesterol: 62 mg/dL (ref <100)
TRIGLYCERIDES: 96 mg/dL (ref <150)
VLDL: 19 mg/dL (ref <30)

## 2016-12-31 LAB — COMPLETE METABOLIC PANEL WITH GFR
ALBUMIN: 3.8 g/dL (ref 3.6–5.1)
ALK PHOS: 51 U/L (ref 33–130)
ALT: 12 U/L (ref 6–29)
AST: 15 U/L (ref 10–35)
BILIRUBIN TOTAL: 0.4 mg/dL (ref 0.2–1.2)
BUN: 11 mg/dL (ref 7–25)
CALCIUM: 8.9 mg/dL (ref 8.6–10.4)
CO2: 25 mmol/L (ref 20–31)
Chloride: 109 mmol/L (ref 98–110)
Creat: 0.99 mg/dL — ABNORMAL HIGH (ref 0.60–0.93)
GFR, EST AFRICAN AMERICAN: 65 mL/min (ref >=60)
GFR, EST NON AFRICAN AMERICAN: 56 mL/min — AB (ref >=60)
Glucose, Bld: 101 mg/dL — ABNORMAL HIGH (ref 65–99)
Potassium: 4.3 mmol/L (ref 3.5–5.3)
Sodium: 143 mmol/L (ref 135–146)
TOTAL PROTEIN: 6.4 g/dL (ref 6.1–8.1)

## 2017-01-01 ENCOUNTER — Ambulatory Visit (INDEPENDENT_AMBULATORY_CARE_PROVIDER_SITE_OTHER): Payer: Medicare HMO

## 2017-01-01 ENCOUNTER — Ambulatory Visit: Payer: Commercial Managed Care - HMO

## 2017-01-01 VITALS — BP 132/82 | HR 78 | Temp 99.3°F | Ht 64.0 in | Wt 194.1 lb

## 2017-01-01 DIAGNOSIS — Z0000 Encounter for general adult medical examination without abnormal findings: Secondary | ICD-10-CM | POA: Diagnosis not present

## 2017-01-01 LAB — HEMOGLOBIN A1C
Hgb A1c MFr Bld: 6.2 % — ABNORMAL HIGH (ref <5.7)
Mean Plasma Glucose: 131 mg/dL

## 2017-01-01 NOTE — Patient Instructions (Addendum)
[name] , Thank you for taking time to come for your Medicare Wellness Visit. I appreciate your ongoing commitment to your health goals. Please review the following plan we discussed and let me know if I can assist you in the future.   Screening recommendations/referrals: Colonoscopy: Up to date, next due 11/2021 Mammogram: Up to date, next due 10/2017 Bone Density: Up to date Recommended yearly ophthalmology/optometry visit for glaucoma screening and checkup Recommended yearly dental visit for hygiene and checkup  Vaccinations: Influenza vaccine: Up to date, next due 04/2017 Pneumococcal vaccine: Up to date Tdap vaccine: Due now, declines Shingles vaccine: Up to date    Advanced directives: Advance directive discussed with you today. I have provided a copy for you to complete at home and have notarized. Once this is complete please bring a copy in to our office so we can scan it into your chart.  Conditions/risks identified: Obese, recommend starting a routine exercise program at least 3 days a week for 30-45 minutes at a time as tolerated.   Next appointment: Follow up with Dr. Moshe Cipro toward the end of May. Follow up in 1 year for your annual wellness visit.   Preventive Care 95 Years and Older, Female Preventive care refers to lifestyle choices and visits with your health care provider that can promote health and wellness. What does preventive care include?  A yearly physical exam. This is also called an annual well check.  Dental exams once or twice a year.  Routine eye exams. Ask your health care provider how often you should have your eyes checked.  Personal lifestyle choices, including:  Daily care of your teeth and gums.  Regular physical activity.  Eating a healthy diet.  Avoiding tobacco and drug use.  Limiting alcohol use.  Practicing safe sex.  Taking low-dose aspirin every day.  Taking vitamin and mineral supplements as recommended by your health care  provider. What happens during an annual well check? The services and screenings done by your health care provider during your annual well check will depend on your age, overall health, lifestyle risk factors, and family history of disease. Counseling  Your health care provider may ask you questions about your:  Alcohol use.  Tobacco use.  Drug use.  Emotional well-being.  Home and relationship well-being.  Sexual activity.  Eating habits.  History of falls.  Memory and ability to understand (cognition).  Work and work Statistician.  Reproductive health. Screening  You may have the following tests or measurements:  Height, weight, and BMI.  Blood pressure.  Lipid and cholesterol levels. These may be checked every 5 years, or more frequently if you are over 62 years old.  Skin check.  Lung cancer screening. You may have this screening every year starting at age 58 if you have a 30-pack-year history of smoking and currently smoke or have quit within the past 15 years.  Fecal occult blood test (FOBT) of the stool. You may have this test every year starting at age 64.  Flexible sigmoidoscopy or colonoscopy. You may have a sigmoidoscopy every 5 years or a colonoscopy every 10 years starting at age 93.  Hepatitis C blood test.  Hepatitis B blood test.  Sexually transmitted disease (STD) testing.  Diabetes screening. This is done by checking your blood sugar (glucose) after you have not eaten for a while (fasting). You may have this done every 1-3 years.  Bone density scan. This is done to screen for osteoporosis. You may have this done  starting at age 41.  Mammogram. This may be done every 1-2 years. Talk to your health care provider about how often you should have regular mammograms. Talk with your health care provider about your test results, treatment options, and if necessary, the need for more tests. Vaccines  Your health care provider may recommend certain  vaccines, such as:  Influenza vaccine. This is recommended every year.  Tetanus, diphtheria, and acellular pertussis (Tdap, Td) vaccine. You may need a Td booster every 10 years.  Zoster vaccine. You may need this after age 43.  Pneumococcal 13-valent conjugate (PCV13) vaccine. One dose is recommended after age 51.  Pneumococcal polysaccharide (PPSV23) vaccine. One dose is recommended after age 56. Talk to your health care provider about which screenings and vaccines you need and how often you need them. This information is not intended to replace advice given to you by your health care provider. Make sure you discuss any questions you have with your health care provider. Document Released: 10/05/2015 Document Revised: 05/28/2016 Document Reviewed: 07/10/2015 Elsevier Interactive Patient Education  2017 [place] Prevention in the Home Falls can cause injuries. They can happen to people of all ages. There are many things you can do to make your home safe and to help prevent falls. What can I do on the outside of my home?  Regularly fix the edges of walkways and driveways and fix any cracks.  Remove anything that might make you trip as you walk through a door, such as a raised step or threshold.  Trim any bushes or trees on the path to your home.  Use bright outdoor lighting.  Clear any walking paths of anything that might make someone trip, such as rocks or tools.  Regularly check to see if handrails are loose or broken. Make sure that both sides of any steps have handrails.  Any raised decks and porches should have guardrails on the edges.  Have any leaves, snow, or ice cleared regularly.  Use sand or salt on walking paths during winter.  Clean up any spills in your garage right away. This includes oil or grease spills. What can I do in the bathroom?  Use night lights.  Install grab bars by the toilet and in the tub and shower. Do not use towel bars as grab  bars.  Use non-skid mats or decals in the tub or shower.  If you need to sit down in the shower, use a plastic, non-slip stool.  Keep the floor dry. Clean up any water that spills on the floor as soon as it happens.  Remove soap buildup in the tub or shower regularly.  Attach bath mats securely with double-sided non-slip rug tape.  Do not have throw rugs and other things on the floor that can make you trip. What can I do in the bedroom?  Use night lights.  Make sure that you have a light by your bed that is easy to reach.  Do not use any sheets or blankets that are too big for your bed. They should not hang down onto the floor.  Have a firm chair that has side arms. You can use this for support while you get dressed.  Do not have throw rugs and other things on the floor that can make you trip. What can I do in the kitchen?  Clean up any spills right away.  Avoid walking on wet floors.  Keep items that you use a lot in easy-to-reach places.  If you need to reach something above you, use a strong step stool that has a grab bar.  Keep electrical cords out of the way.  Do not use floor polish or wax that makes floors slippery. If you must use wax, use non-skid floor wax.  Do not have throw rugs and other things on the floor that can make you trip. What can I do with my stairs?  Do not leave any items on the stairs.  Make sure that there are handrails on both sides of the stairs and use them. Fix handrails that are broken or loose. Make sure that handrails are as long as the stairways.  Check any carpeting to make sure that it is firmly attached to the stairs. Fix any carpet that is loose or worn.  Avoid having throw rugs at the top or bottom of the stairs. If you do have throw rugs, attach them to the floor with carpet tape.  Make sure that you have a light switch at the top of the stairs and the bottom of the stairs. If you do not have them, ask someone to add them for  you. What else can I do to help prevent falls?  Wear shoes that:  Do not have high heels.  Have rubber bottoms.  Are comfortable and fit you well.  Are closed at the toe. Do not wear sandals.  If you use a stepladder:  Make sure that it is fully opened. Do not climb a closed stepladder.  Make sure that both sides of the stepladder are locked into place.  Ask someone to hold it for you, if possible.  Clearly mark and make sure that you can see:  Any grab bars or handrails.  First and last steps.  Where the edge of each step is.  Use tools that help you move around (mobility aids) if they are needed. These include:  Canes.  Walkers.  Scooters.  Crutches.  Turn on the lights when you go into a dark area. Replace any light bulbs as soon as they burn out.  Set up your furniture so you have a clear path. Avoid moving your furniture around.  If any of your floors are uneven, fix them.  If there are any pets around you, be aware of where they are.  Review your medicines with your doctor. Some medicines can make you feel dizzy. This can increase your chance of falling. Ask your doctor what other things that you can do to help prevent falls. This information is not intended to replace advice given to you by your health care provider. Make sure you discuss any questions you have with your health care provider. Document Released: 07/05/2009 Document Revised: 02/14/2016 Document Reviewed: 10/13/2014 Elsevier Interactive Patient Education  2017 [employer].

## 2017-01-01 NOTE — Progress Notes (Signed)
Subjective:   Jillian [name] is a 76 y.o. female who presents for Medicare Annual (Subsequent) preventive examination.  Review of Systems:  Cardiac Risk Factors include: advanced age (>40men, >46 women);diabetes mellitus;dyslipidemia;hypertension;sedentary lifestyle;obesity (BMI >30kg/m2)     Objective:     Vitals: BP 132/82   Pulse 78   Temp 99.3 F (37.4 C) (Temporal)   Ht 5\' 4"  (1.626 m)   Wt 194 lb 1.3 oz (88 kg)   SpO2 95%   BMI 33.31 kg/m   Body mass index is 33.31 kg/m.   Tobacco History  Smoking Status  . Never Smoker  Smokeless Tobacco  . Never Used     Counseling given: Not Answered   Past Medical History:  Diagnosis Date  . Breast cancer ([place]) 2016   Right  . Chronic fatigue   . Diabetes mellitus 2011  . DJD (degenerative joint disease)    of neck    . Dyslipidemia   . Hypertension 2005  . Mild obesity   . Postmenopausal   . Wears glasses    Past Surgical History:  Procedure Laterality Date  . ABDOMINAL HYSTERECTOMY  1992   fibroids  . APPENDECTOMY    . BREAST LUMPECTOMY WITH RADIOACTIVE SEED LOCALIZATION Right 10/05/2014   Procedure: BREAST LUMPECTOMY WITH RADIOACTIVE SEED LOCALIZATION;  Surgeon: Erroll Luna, MD;  Location: [facility];  Service: General;  Laterality: Right;  . cataract Bilateral   . CYST EXCISION Right    arm  . EYE SURGERY Right 06/07/2013   cataract  . EYE SURGERY Left 09/14/2013   cataract  . KNEE ARTHROSCOPY Right 02/26/2016   Procedure: ARTHROSCOPY RIGHT KNEE WITH MENSICAL DEBRIDEMENT;  Surgeon: Gaynelle Arabian, MD;  Location: [facility];  Service: Orthopedics;  Laterality: Right;  . left breast biopsy    . LESION REMOVAL Right 03/30/2013   Procedure: EXCISION OF NEOPLASM ARM;  Surgeon: Jamesetta So, MD;  Location: [facility];  Service: General;  Laterality: Right;  . TUBAL LIGATION     Family History  Problem Relation Age of Onset  . Hypertension Father   . Congestive Heart Failure Father   . Liver  cancer Mother   . Hypertension Sister   . Hypertension Brother   . Diabetes Brother    History  Sexual Activity  . Sexual activity: Not Currently  . Birth control/ protection: Surgical    Outpatient Encounter Prescriptions as of 01/01/2017  Medication Sig  . acetaminophen (TYLENOL) 500 MG tablet Take 1,000 mg by mouth every 6 (six) hours as needed for mild pain.  Marland Kitchen aspirin EC 81 MG tablet Take 1 tablet (81 mg total) by mouth every evening. (Patient taking differently: Take 81 mg by mouth every morning. )  . Calcium Carbonate-Vitamin D (OS-CAL 250 + D PO) Take 1 tablet by mouth daily.  . cloNIDine (CATAPRES) 0.1 MG tablet TAKE 1 TABLET AT BEDTIME  . ibuprofen (ADVIL,MOTRIN) 800 MG tablet Take 1 tablet (800 mg total) by mouth every 8 (eight) hours as needed.  . metFORMIN (GLUCOPHAGE-XR) 500 MG 24 hr tablet TAKE 1 TABLET TWICE DAILY  . tamoxifen (NOLVADEX) 20 MG tablet TAKE 1 TABLET EVERY DAY  . [DISCONTINUED] HYDROcodone-acetaminophen (NORCO/VICODIN) 5-325 MG tablet Take 1 tablet by mouth every 4 (four) hours as needed.  . benazepril (LOTENSIN) 10 MG tablet TAKE 1 TABLET EVERY DAY (Patient not taking: Reported on 01/01/2017)  . pravastatin (PRAVACHOL) 10 MG tablet Take 10 mg by mouth daily.   . [DISCONTINUED] HYDROcodone-acetaminophen (NORCO/VICODIN)  5-325 MG tablet One tablet at bedtime as needed, for uncontrolled back pain  . [DISCONTINUED] predniSONE (STERAPRED UNI-PAK 21 TAB) 5 MG (21) TBPK tablet Take 1 tablet (5 mg total) by mouth as directed. Use as directed   No facility-administered encounter medications on file as of 01/01/2017.     Activities of Daily Living In your present state of health, do you have any difficulty performing the following activities: 01/01/2017 08/13/2016  Hearing? N N  Vision? N N  Difficulty concentrating or making decisions? N N  Walking or climbing stairs? N N  Dressing or bathing? N N  Doing errands, shopping? N N  Preparing Food and eating ? N -    Using the Toilet? N -  In the past six months, have you accidently leaked urine? N -  Do you have problems with loss of bowel control? N -  Managing your Medications? N -  Managing your Finances? N -  Housekeeping or managing your Housekeeping? N -  Some recent data might be hidden    Patient Care Team: Fayrene Helper, MD as PCP - General Danie Binder, MD (Gastroenterology) Hurley Cisco, MD as Consulting Physician (Rheumatology) Patrici Ranks, MD as Consulting Physician (Hematology and Oncology)    Assessment:    Exercise Activities and Dietary recommendations Current Exercise Habits: The patient does not participate in regular exercise at present, Exercise limited by: None identified  Goals    . Exercise 3x per week (30 min per time)          Recommend starting a routine exercise program at least 3 days a week for 30-45 minutes at a time as tolerated.        Fall Risk Fall Risk  01/01/2017 08/13/2016 12/26/2015 10/31/2015 09/28/2014  Falls in the past year? No Yes Yes No No  Number falls in past yr: - 2 or more 2 or more - -  Injury with Fall? - No - - -   Depression Screen PHQ 2/9 Scores 01/01/2017 08/13/2016 10/31/2015 09/28/2014  PHQ - 2 Score 0 0 0 0     Cognitive Function: Normal   6CIT Screen 01/01/2017  What Year? 0 points  What month? 0 points  What time? 0 points  Count back from 20 0 points  Months in reverse 0 points  Repeat phrase 0 points  Total Score 0    Immunization History  Administered Date(s) Administered  . H1N1 10/16/2008  . Influenza Whole 07/02/2007, 06/14/2009, 06/04/2010  . Influenza,inj,Quad PF,36+ Mos 07/20/2013, 06/01/2014, 06/26/2015, 07/28/2016  . Pneumococcal Conjugate-13 09/28/2014  . Pneumococcal Polysaccharide-23 04/20/2009  . Td 05/01/2004  . Zoster 10/23/2006   Screening Tests Health Maintenance  Topic Date Due  . TETANUS/TDAP  03/11/2017 (Originally 05/01/2014)  . INFLUENZA VACCINE  04/22/2017  . HEMOGLOBIN A1C   07/02/2017  . OPHTHALMOLOGY EXAM  07/24/2017  . FOOT EXAM  07/28/2017  . MAMMOGRAM  11/18/2018  . COLONOSCOPY  12/21/2021  . DEXA SCAN  Completed  . PNA vac Low Risk Adult  Completed      Plan:  I have personally reviewed and addressed the Medicare Annual Wellness questionnaire and have noted the following in the patient's chart:  A. Medical and social history B. Use of alcohol, tobacco or illicit drugs  C. Current medications and supplements D. Functional ability and status E.  Nutritional status F.  Physical activity G. Advance directives H. List of other physicians I.  Hospitalizations, surgeries, and ER visits in previous 9  months J.  Vitals K. Screenings to include cognitive, depression, and falls L. Referrals and appointments - none  In addition, I have reviewed and discussed with patient certain preventive protocols, quality metrics, and best practice recommendations. A written personalized care plan for preventive services as well as general preventive health recommendations were provided to patient.  Signed,   Stormy Fabian, LPN Lead Nurse Health Advisor

## 2017-01-07 ENCOUNTER — Emergency Department (HOSPITAL_COMMUNITY): Payer: Medicare HMO

## 2017-01-07 ENCOUNTER — Emergency Department (HOSPITAL_COMMUNITY)
Admission: EM | Admit: 2017-01-07 | Discharge: 2017-01-07 | Disposition: A | Discharge status: Home / Self Care | Payer: Medicare HMO | Attending: Emergency Medicine | Admitting: Emergency Medicine

## 2017-01-07 ENCOUNTER — Encounter (HOSPITAL_COMMUNITY): Payer: Self-pay | Admitting: *Deleted

## 2017-01-07 DIAGNOSIS — Z7982 Long term (current) use of aspirin: Secondary | ICD-10-CM | POA: Diagnosis not present

## 2017-01-07 DIAGNOSIS — Z853 Personal history of malignant neoplasm of breast: Secondary | ICD-10-CM | POA: Insufficient documentation

## 2017-01-07 DIAGNOSIS — Z79899 Other long term (current) drug therapy: Secondary | ICD-10-CM | POA: Diagnosis not present

## 2017-01-07 DIAGNOSIS — E119 Type 2 diabetes mellitus without complications: Secondary | ICD-10-CM | POA: Insufficient documentation

## 2017-01-07 DIAGNOSIS — Z7984 Long term (current) use of oral hypoglycemic drugs: Secondary | ICD-10-CM | POA: Diagnosis not present

## 2017-01-07 DIAGNOSIS — I10 Essential (primary) hypertension: Secondary | ICD-10-CM | POA: Diagnosis not present

## 2017-01-07 DIAGNOSIS — Y9389 Activity, other specified: Secondary | ICD-10-CM | POA: Diagnosis not present

## 2017-01-07 DIAGNOSIS — Y929 Unspecified place or not applicable: Secondary | ICD-10-CM | POA: Insufficient documentation

## 2017-01-07 DIAGNOSIS — X58XXXA Exposure to other specified factors, initial encounter: Secondary | ICD-10-CM | POA: Diagnosis not present

## 2017-01-07 DIAGNOSIS — Y999 Unspecified external cause status: Secondary | ICD-10-CM | POA: Insufficient documentation

## 2017-01-07 DIAGNOSIS — S46812A Strain of other muscles, fascia and tendons at shoulder and upper arm level, left arm, initial encounter: Secondary | ICD-10-CM | POA: Diagnosis not present

## 2017-01-07 DIAGNOSIS — S4992XA Unspecified injury of left shoulder and upper arm, initial encounter: Secondary | ICD-10-CM | POA: Diagnosis present

## 2017-01-07 DIAGNOSIS — M25512 Pain in left shoulder: Secondary | ICD-10-CM | POA: Diagnosis not present

## 2017-01-07 MED ORDER — METHOCARBAMOL 500 MG PO TABS: 500.0000 mg | ORAL_TABLET | Freq: Two times a day (BID) | ORAL | 0 refills | Status: DC

## 2017-01-07 MED ORDER — NAPROXEN 500 MG PO TABS: 500.0000 mg | ORAL_TABLET | Freq: Two times a day (BID) | ORAL | 0 refills | Status: DC

## 2017-01-07 MED ORDER — KETOROLAC TROMETHAMINE 30 MG/ML IJ SOLN
30.0000 mg | Freq: Once | INTRAMUSCULAR | Status: AC
  Administered 2017-01-07: 30 mg via INTRAMUSCULAR
  Filled 2017-01-07: qty 1

## 2017-01-07 NOTE — ED Triage Notes (Signed)
Pt with left shoulder pain, hx of same.  Denies injury.

## 2017-01-07 NOTE — ED Provider Notes (Signed)
[facility] DEPT Provider Note   CSN: [id number] Arrival date & time: 01/07/17  1415     History   Chief Complaint Chief Complaint  Patient presents with  . Shoulder Pain    HPI Jillian [name] is a 76 y.o. female.  HPI  76 y.o. female presents to the Emergency Department today complaining of left arm pain that started yesterday. Denies injury to area. Notes she woke up with pain when it started to hurt. Denies CP/SOB. No diaphoresis. Notes pain is constant and worse with movement. States that she was helping her husband ambulate the day before and noted pain afterwards. No numbness/tingling. No decrease in grip strength. No swelling. No bruising. No meds PTA. No other symptoms noted   Past Medical History:  Diagnosis Date  . Breast cancer ([place]) 2016   Right  . Chronic fatigue   . Diabetes mellitus 2011  . DJD (degenerative joint disease)    of neck    . Dyslipidemia   . Hypertension 2005  . Mild obesity   . Postmenopausal   . Wears glasses     Patient Active Problem List   Diagnosis Date Noted  . Chronic left SI joint pain 05/21/2016  . Annual physical exam 06/26/2015  . Primary osteoarthritis of both knees 06/26/2015  . 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 Thoracic spine pain 09/28/2014  . Need for prophylactic vaccination and inoculation against influenza 06/04/2014  . Hip pain 08/06/2012  . 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 Hot flashes, menopausal 09/10/2011  . 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 INSOMNIA, CHRONIC 11/28/2009  . Hyperlipidemia with target LDL less than 100 10/12/2007  . Obesity (BMI 30.0-34.9) 10/12/2007  . ESSENTIAL HYPERTENSION, BENIGN 10/12/2007    Past Surgical History:  Procedure Laterality Date  . ABDOMINAL HYSTERECTOMY  1992   fibroids  . APPENDECTOMY    . BREAST LUMPECTOMY WITH RADIOACTIVE SEED LOCALIZATION Right 10/05/2014   Procedure: BREAST LUMPECTOMY WITH  RADIOACTIVE SEED LOCALIZATION;  Surgeon: Erroll Luna, MD;  Location: [facility];  Service: General;  Laterality: Right;  . cataract Bilateral   . CYST EXCISION Right    arm  . EYE SURGERY Right 06/07/2013   cataract  . EYE SURGERY Left 09/14/2013   cataract  . KNEE ARTHROSCOPY Right 02/26/2016   Procedure: ARTHROSCOPY RIGHT KNEE WITH MENSICAL DEBRIDEMENT;  Surgeon: Gaynelle Arabian, MD;  Location: [facility];  Service: Orthopedics;  Laterality: Right;  . left breast biopsy    . LESION REMOVAL Right 03/30/2013   Procedure: EXCISION OF NEOPLASM ARM;  Surgeon: Jamesetta So, MD;  Location: [facility];  Service: General;  Laterality: Right;  . TUBAL LIGATION      OB History    Gravida Para Term Preterm AB Living   2 2 2          SAB TAB Ectopic Multiple Live Births                   Home Medications    Prior to Admission medications   Medication Sig Start Date End Date Taking? Authorizing Provider  acetaminophen (TYLENOL) 500 MG tablet Take 1,000 mg by mouth every 6 (six) hours as needed for mild pain.    Historical Provider, MD  aspirin EC 81 MG tablet Take 1 tablet (81 mg total) by mouth every evening. Patient taking differently: Take 81 mg by mouth every morning.  04/23/16   Fayrene Helper, MD  benazepril (LOTENSIN) 10 MG tablet TAKE 1 TABLET EVERY DAY Patient not taking: Reported on 01/01/2017 11/25/16   Fayrene Helper, MD  Calcium Carbonate-Vitamin D (OS-CAL 250 + D PO) Take 1 tablet by mouth daily.    Historical Provider, MD  cloNIDine (CATAPRES) 0.1 MG tablet TAKE 1 TABLET AT BEDTIME 11/25/16   Fayrene Helper, MD  ibuprofen (ADVIL,MOTRIN) 800 MG tablet Take 1 tablet (800 mg total) by mouth every 8 (eight) hours as needed. 09/01/16   Fayrene Helper, MD  metFORMIN (GLUCOPHAGE-XR) 500 MG 24 hr tablet TAKE 1 TABLET TWICE DAILY 11/25/16   Fayrene Helper, MD  pravastatin (PRAVACHOL) 10 MG tablet Take 10 mg by mouth daily.  12/27/15   Historical Provider, MD  tamoxifen  (NOLVADEX) 20 MG tablet TAKE 1 TABLET EVERY DAY 11/25/16   Baird Cancer, PA-C    Family History Family History  Problem Relation Age of Onset  . Hypertension Father   . Congestive Heart Failure Father   . Liver cancer Mother   . Hypertension Sister   . Hypertension Brother   . Diabetes Brother     Social History Social History  Substance Use Topics  . Smoking status: Never Smoker  . Smokeless tobacco: Never Used  . Alcohol use No     Allergies   Codeine and Propoxyphene n-acetaminophen   Review of Systems Review of Systems  Constitutional: Negative for fever.  Gastrointestinal: Negative for nausea and vomiting.  Musculoskeletal: Positive for myalgias.  Skin: Negative for wound.   Physical Exam Updated Vital Signs BP (!) 142/81 (BP Location: Right Arm)   Pulse 95   Temp 98.4 F (36.9 C)   Resp 15   Ht 5\' 4"  (1.626 m)   Wt 88 kg   SpO2 99%   BMI 33.30 kg/m   Physical Exam  Constitutional: She is oriented to person, place, and time. Vital signs are normal. She appears well-developed and well-nourished.  HENT:  Head: Normocephalic and atraumatic.  Right Ear: Hearing normal.  Left Ear: Hearing normal.  Eyes: Conjunctivae and EOM are normal. Pupils are equal, round, and reactive to light.  Neck: Normal range of motion. Neck supple.  Cardiovascular: Normal rate, regular rhythm, normal heart sounds and intact distal pulses.   Pulmonary/Chest: Effort normal and breath sounds normal.  Abdominal: Soft. There is no tenderness.  Musculoskeletal: Normal range of motion.  TTP left trapezius musculature. No palpable or visible deformities. Area TTP. No bruising or ecchymosis. Full ROM of shoulder with mild discomfort. NVI. Distal pulses appreciated.   Neurological: She is alert and oriented to person, place, and time.  Skin: Skin is warm and dry.  Psychiatric: She has a normal mood and affect. Her speech is normal and behavior is normal. Thought content normal.    Nursing note and vitals reviewed.  ED Treatments / Results  Labs (all labs ordered are listed, but only abnormal results are displayed) Labs Reviewed - No data to display  EKG  EKG Interpretation None       Radiology Dg Shoulder Left  Result Date: 01/07/2017 CLINICAL DATA:  Left shoulder pain along the shoulder blade and down the left arm. EXAM: LEFT SHOULDER - 2+ VIEW COMPARISON:  01/22/2014 FINDINGS: Calcification adjacent to the greater tuberosity on the prior study is no longer seen. No fracture or dislocation is identified. Glenohumeral joint space width is preserved. There is mild acromioclavicular osteoarthrosis. IMPRESSION: 1. Mild acromioclavicular osteoarthrosis. 2. No evidence of acute osseous abnormality. Electronically Signed  By: Logan Bores M.D.   On: 01/07/2017 14:54    Procedures Procedures (including critical care time)  Medications Ordered in ED Medications - No data to display   Initial Impression / Assessment and Plan / ED Course  I have reviewed the triage vital signs and the nursing notes.  Pertinent labs & imaging results that were available during my care of the patient were reviewed by me and considered in my medical decision making (see chart for details).  Final Clinical Impressions(s) / ED Diagnoses   {I have reviewed and evaluated the relevant imaging studies.  {I have reviewed the relevant previous healthcare records.  {I obtained HPI from historian.   ED Course:  Assessment: Patient X-Ray negative for obvious fracture or dislocation.  TTP along trapezius musculature. Likely strain after aiding husband in ambulation the day before. Pt advised to follow up with PCP. Conservative therapy recommended and discussed. Patient will be discharged home & is agreeable with above plan. Returns precautions discussed. Pt appears safe for discharge.  Disposition/Plan:  DC Home Additional Verbal discharge instructions given and discussed with patient.  Pt  Instructed to f/u with PCP in the next week for evaluation and treatment of symptoms. Return precautions given Pt acknowledges and agrees with plan  Supervising Physician Milton Ferguson, MD  Final diagnoses:  Strain of left trapezius muscle, initial encounter    New Prescriptions New Prescriptions   No medications on file     Shary Decamp, PA-C 01/07/17 [place], MD 01/13/17 2144

## 2017-01-07 NOTE — Discharge Instructions (Signed)
Please read and follow all provided instructions.  Your diagnoses today include:  1. Strain of left trapezius muscle, initial encounter     Tests performed today include: Vital signs. See below for your results today.   Medications prescribed:  Take as prescribed   Home care instructions:  Follow any educational materials contained in this packet.  Follow-up instructions: Please follow-up with your primary care provider for further evaluation of symptoms and treatment   Return instructions:  Please return to the Emergency Department if you do not get better, if you get worse, or new symptoms OR  - Fever (temperature greater than 101.52F)  - Bleeding that does not stop with holding pressure to the area    -Severe pain (please note that you may be more sore the day after your accident)  - Chest Pain  - Difficulty breathing  - Severe nausea or vomiting  - Inability to tolerate food and liquids  - Passing out  - Skin becoming red around your wounds  - Change in mental status (confusion or lethargy)  - New numbness or weakness    Please return if you have any other emergent concerns.  Additional Information:  Your vital signs today were: BP (!) 142/81 (BP Location: Right Arm)    Pulse 95    Temp 98.4 F (36.9 C)    Resp 15    Ht 5\' 4"  (1.626 m)    Wt 88 kg    SpO2 99%    BMI 33.30 kg/m  If your blood pressure (BP) was elevated above 135/85 this visit, please have this repeated by your doctor within one month. ---------------

## 2017-01-07 NOTE — ED Notes (Signed)
Pt called for triage. No answer.

## 2017-01-07 NOTE — ED Triage Notes (Signed)
Pt comes in with left arm pain starting yesterday. Pt denies any injury. States she woke up and she was fine then she began starting to hurt. Pt denies any chest pain or jaw pain. Pt states her pain is constant and doesn't change with movement.   EKG completed in triage.

## 2017-02-10 ENCOUNTER — Emergency Department (HOSPITAL_COMMUNITY)
Admission: EM | Admit: 2017-02-10 | Discharge: 2017-02-10 | Disposition: A | Discharge status: Home / Self Care | Payer: Medicare HMO | Attending: Emergency Medicine | Admitting: Emergency Medicine

## 2017-02-10 ENCOUNTER — Encounter (HOSPITAL_COMMUNITY): Payer: Self-pay | Admitting: Emergency Medicine

## 2017-02-10 DIAGNOSIS — Z79899 Other long term (current) drug therapy: Secondary | ICD-10-CM | POA: Insufficient documentation

## 2017-02-10 DIAGNOSIS — M62830 Muscle spasm of back: Secondary | ICD-10-CM | POA: Insufficient documentation

## 2017-02-10 DIAGNOSIS — Y999 Unspecified external cause status: Secondary | ICD-10-CM | POA: Insufficient documentation

## 2017-02-10 DIAGNOSIS — Z7984 Long term (current) use of oral hypoglycemic drugs: Secondary | ICD-10-CM | POA: Insufficient documentation

## 2017-02-10 DIAGNOSIS — Y939 Activity, unspecified: Secondary | ICD-10-CM | POA: Insufficient documentation

## 2017-02-10 DIAGNOSIS — Z7982 Long term (current) use of aspirin: Secondary | ICD-10-CM | POA: Diagnosis not present

## 2017-02-10 DIAGNOSIS — Z853 Personal history of malignant neoplasm of breast: Secondary | ICD-10-CM | POA: Insufficient documentation

## 2017-02-10 DIAGNOSIS — I10 Essential (primary) hypertension: Secondary | ICD-10-CM | POA: Insufficient documentation

## 2017-02-10 DIAGNOSIS — E119 Type 2 diabetes mellitus without complications: Secondary | ICD-10-CM | POA: Diagnosis not present

## 2017-02-10 DIAGNOSIS — X58XXXA Exposure to other specified factors, initial encounter: Secondary | ICD-10-CM | POA: Insufficient documentation

## 2017-02-10 DIAGNOSIS — Y929 Unspecified place or not applicable: Secondary | ICD-10-CM | POA: Insufficient documentation

## 2017-02-10 DIAGNOSIS — S29012A Strain of muscle and tendon of back wall of thorax, initial encounter: Secondary | ICD-10-CM | POA: Insufficient documentation

## 2017-02-10 DIAGNOSIS — M549 Dorsalgia, unspecified: Secondary | ICD-10-CM | POA: Diagnosis present

## 2017-02-10 DIAGNOSIS — T148XXA Other injury of unspecified body region, initial encounter: Secondary | ICD-10-CM

## 2017-02-10 MED ORDER — METHOCARBAMOL 500 MG PO TABS: 500.0000 mg | ORAL_TABLET | Freq: Two times a day (BID) | ORAL | 0 refills | Status: DC

## 2017-02-10 MED ORDER — TRAMADOL HCL 50 MG PO TABS: 50.0000 mg | ORAL_TABLET | Freq: Four times a day (QID) | ORAL | 0 refills | Status: DC | PRN

## 2017-02-10 MED ORDER — KETOROLAC TROMETHAMINE 60 MG/2ML IM SOLN
30.0000 mg | Freq: Once | INTRAMUSCULAR | Status: AC
  Administered 2017-02-10: 30 mg via INTRAMUSCULAR
  Filled 2017-02-10: qty 2

## 2017-02-10 MED ORDER — NAPROXEN 500 MG PO TABS: 500.0000 mg | ORAL_TABLET | Freq: Two times a day (BID) | ORAL | 0 refills | Status: DC

## 2017-02-10 NOTE — ED Provider Notes (Signed)
[facility] DEPT Provider Note   CSN: [id number] Arrival date & time: 02/10/17  1311     History   Chief Complaint Chief Complaint  Patient presents with  . Back Pain    HPI Jillian [name] is a 76 y.o. female.  HPI  Jillian [name] is a 76 y.o. female who presents to the Emergency Department complaining of recurrent left scapula pain for 3 days.  She states that she has been seen here several times previously for similar pain.  She describes a sharp, burning pain to the left upper back and around the scapula.  Pain associated with left movement and improves at rest.  She denies fever, neck pain, numbness, pain, or weakness of the left arm.  Also denies chest pain or shortness of breath.   Past Medical History:  Diagnosis Date  . Breast cancer ([place]) 2016   Right  . Chronic fatigue   . Diabetes mellitus 2011  . DJD (degenerative joint disease)    of neck    . Dyslipidemia   . Hypertension 2005  . Mild obesity   . Postmenopausal   . Wears glasses     Patient Active Problem List   Diagnosis Date Noted  . Chronic left SI joint pain 05/21/2016  . Annual physical exam 06/26/2015  . Primary osteoarthritis of both knees 06/26/2015  . 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 Thoracic spine pain 09/28/2014  . Need for prophylactic vaccination and inoculation against influenza 06/04/2014  . Hip pain 08/06/2012  . 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 Hot flashes, menopausal 09/10/2011  . 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 INSOMNIA, CHRONIC 11/28/2009  . Hyperlipidemia with target LDL less than 100 10/12/2007  . Obesity (BMI 30.0-34.9) 10/12/2007  . ESSENTIAL HYPERTENSION, BENIGN 10/12/2007    Past Surgical History:  Procedure Laterality Date  . ABDOMINAL HYSTERECTOMY  1992   fibroids  . APPENDECTOMY    . BREAST LUMPECTOMY WITH RADIOACTIVE SEED LOCALIZATION Right 10/05/2014   Procedure: BREAST LUMPECTOMY WITH  RADIOACTIVE SEED LOCALIZATION;  Surgeon: Erroll Luna, MD;  Location: [facility];  Service: General;  Laterality: Right;  . cataract Bilateral   . CYST EXCISION Right    arm  . EYE SURGERY Right 06/07/2013   cataract  . EYE SURGERY Left 09/14/2013   cataract  . KNEE ARTHROSCOPY Right 02/26/2016   Procedure: ARTHROSCOPY RIGHT KNEE WITH MENSICAL DEBRIDEMENT;  Surgeon: Gaynelle Arabian, MD;  Location: [facility];  Service: Orthopedics;  Laterality: Right;  . left breast biopsy    . LESION REMOVAL Right 03/30/2013   Procedure: EXCISION OF NEOPLASM ARM;  Surgeon: Jamesetta So, MD;  Location: [facility];  Service: General;  Laterality: Right;  . TUBAL LIGATION      OB History    Gravida Para Term Preterm AB Living   2 2 2          SAB TAB Ectopic Multiple Live Births                   Home Medications    Prior to Admission medications   Medication Sig Start Date End Date Taking? Authorizing Provider  acetaminophen (TYLENOL) 500 MG tablet Take 1,000 mg by mouth every 6 (six) hours as needed for mild pain.    [provider]  aspirin EC 81 MG tablet Take 1 tablet (81 mg total) by mouth every evening. Patient taking differently: Take 81 mg by mouth  every morning.  04/23/16   Fayrene Helper, MD  benazepril (LOTENSIN) 10 MG tablet TAKE 1 TABLET EVERY DAY Patient not taking: Reported on 01/01/2017 11/25/16   Fayrene Helper, MD  cloNIDine (CATAPRES) 0.1 MG tablet TAKE 1 TABLET AT BEDTIME 11/25/16   Fayrene Helper, MD  ibuprofen (ADVIL,MOTRIN) 800 MG tablet Take 1 tablet (800 mg total) by mouth every 8 (eight) hours as needed. 09/01/16   Fayrene Helper, MD  metFORMIN (GLUCOPHAGE-XR) 500 MG 24 hr tablet TAKE 1 TABLET TWICE DAILY Patient taking differently: TAKE 1 TABLET DAILY 11/25/16   Fayrene Helper, MD  methocarbamol (ROBAXIN) 500 MG tablet Take 1 tablet (500 mg total) by mouth 2 (two) times daily. 01/07/17   Shary Decamp, PA-C  naproxen (NAPROSYN) 500 MG tablet  Take 1 tablet (500 mg total) by mouth 2 (two) times daily. 01/07/17   Shary Decamp, PA-C  tamoxifen (NOLVADEX) 20 MG tablet TAKE 1 TABLET EVERY DAY Patient taking differently: TAKE 1 TABLET DAILY AT BEDTIME 11/25/16   Baird Cancer, PA-C    Family History Family History  Problem Relation Age of Onset  . Hypertension Father   . Congestive Heart Failure Father   . Liver cancer Mother   . Hypertension Sister   . Hypertension Brother   . Diabetes Brother     Social History Social History  Substance Use Topics  . Smoking status: Never Smoker  . Smokeless tobacco: Never Used  . Alcohol use No     Allergies   Codeine and Propoxyphene n-acetaminophen   Review of Systems Review of Systems  Constitutional: Negative for chills and fever.  HENT: Negative for congestion.   Eyes: Negative for visual disturbance.  Respiratory: Negative for cough, chest tightness and shortness of breath.   Cardiovascular: Negative for chest pain.  Gastrointestinal: Negative for nausea and vomiting.  Genitourinary: Negative for difficulty urinating and dysuria.  Musculoskeletal: Positive for back pain, myalgias and neck pain. Negative for arthralgias and joint swelling.  Skin: Negative for color change, rash and wound.  Neurological: Negative for dizziness, syncope, weakness, numbness and headaches.  Psychiatric/Behavioral: Negative for confusion.  All other systems reviewed and are negative.    Physical Exam Updated Vital Signs BP (!) 149/71 (BP Location: Right Arm)   Pulse 93   Temp 98.1 F (36.7 C) (Oral)   Resp 18   Ht 5\' 4"  (1.626 m)   Wt 86.2 kg (190 lb)   SpO2 94%   BMI 32.61 kg/m   Physical Exam  Constitutional: She appears well-developed and well-nourished. No distress.  HENT:  Head: Atraumatic.  Mouth/Throat: Oropharynx is clear and moist.  Eyes: Conjunctivae and EOM are normal. Pupils are equal, round, and reactive to light.  Neck: Trachea normal, normal range of motion,  full passive range of motion without pain and phonation normal. No JVD present. No spinous process tenderness and no muscular tenderness present. No Kernig's sign noted.  Cardiovascular: Normal rate, regular rhythm, normal heart sounds and intact distal pulses.   Pulmonary/Chest: Effort normal and breath sounds normal.  Abdominal: Soft. She exhibits no distension. There is no tenderness. There is no guarding.  Musculoskeletal: Normal range of motion. She exhibits tenderness. She exhibits no edema or deformity.  ttp of the muscles along the left scapular border.  No bony tenderness.  5/5 grip strength of bilateral UE's  Lymphadenopathy:    She has no cervical adenopathy.  Neurological: She is alert. She displays normal reflexes. No sensory deficit.  Skin: Skin is warm. Capillary refill takes less than 2 seconds.  Nursing note and vitals reviewed.    ED Treatments / Results  Labs (all labs ordered are listed, but only abnormal results are displayed) Labs Reviewed - No data to display  EKG  EKG Interpretation  Date/Time:  Tuesday Feb 10 2017 14:21:23 EDT Ventricular Rate:  89 PR Interval:  154 QRS Duration: 82 QT Interval:  356 QTC Calculation: 433 R Axis:   -4 Text Interpretation:  Normal sinus rhythm Minimal voltage criteria for LVH, may be normal variant Nonspecific T wave abnormality Abnormal ECG No significant change since last tracing Confirmed by Fredia Sorrow [phone]) on 02/10/2017 2:26:38 PM       Radiology No results found.  Procedures Procedures (including critical care time)  Medications Ordered in ED Medications  ketorolac (TORADOL) injection 30 mg (30 mg Intramuscular Given 02/10/17 1427)     Initial Impression / Assessment and Plan / ED Course  I have reviewed the triage vital signs and the nursing notes.  Pertinent labs & imaging results that were available during my care of the patient were reviewed by me and considered in my medical decision making (see  chart for details).     Pt well appearing.  Vitals stable.  Pain to left upper back is recurrent and reproduced with left arm movement and palpation of the muscles along the scapula border.  NV intact.  No motor deficits.  Pt agrees to symptomatic tx and PCP f/u.  Pt appears stable for d/c  Final Clinical Impressions(s) / ED Diagnoses   Final diagnoses:  Muscle strain  Muscle spasm of back    New Prescriptions New Prescriptions   No medications on file     Kem Parkinson, Hershal Coria 02/12/17 2142    Francine Graven, DO 02/13/17 2152

## 2017-02-10 NOTE — Discharge Instructions (Signed)
Alternate ice and heat to your neck and upper back. Call Dr. Griffin Dakin office to arrange a follow-up appointment.

## 2017-02-10 NOTE — ED Triage Notes (Signed)
Patient complains of back pain x 3 days.

## 2017-02-18 ENCOUNTER — Encounter: Payer: Self-pay | Admitting: Family Medicine

## 2017-02-18 ENCOUNTER — Ambulatory Visit (INDEPENDENT_AMBULATORY_CARE_PROVIDER_SITE_OTHER): Payer: Medicare HMO | Admitting: Family Medicine

## 2017-02-18 ENCOUNTER — Other Ambulatory Visit: Payer: Self-pay | Admitting: Family Medicine

## 2017-02-18 VITALS — BP 122/80 | HR 80 | Resp 16 | Ht 64.0 in | Wt 192.0 lb

## 2017-02-18 DIAGNOSIS — G4700 Insomnia, unspecified: Secondary | ICD-10-CM

## 2017-02-18 DIAGNOSIS — M546 Pain in thoracic spine: Secondary | ICD-10-CM | POA: Diagnosis not present

## 2017-02-18 DIAGNOSIS — Z09 Encounter for follow-up examination after completed treatment for conditions other than malignant neoplasm: Secondary | ICD-10-CM | POA: Diagnosis not present

## 2017-02-18 DIAGNOSIS — I10 Essential (primary) hypertension: Secondary | ICD-10-CM | POA: Diagnosis not present

## 2017-02-18 MED ORDER — ALPRAZOLAM 0.25 MG PO TABS: ORAL_TABLET | ORAL | 0 refills | Status: DC

## 2017-02-18 MED ORDER — NAPROXEN 500 MG PO TABS: ORAL_TABLET | ORAL | 0 refills | Status: DC

## 2017-02-18 NOTE — Patient Instructions (Signed)
F/u in 3 month, call if you need me before  Medication sent in short term to help with anxiety and sleep   Naproxen sent in for as needed use for pain, use sparingly  No labs for next visit  Thank you  for choosing  [facility]. We consider it a privelige to serve you.  Delivering excellent health care in a caring and  compassionate way is our goal.  Partnering with you,  so that together we can achieve this goal is our strategy.

## 2017-02-22 ENCOUNTER — Encounter: Payer: Self-pay | Admitting: Family Medicine

## 2017-02-22 NOTE — Assessment & Plan Note (Signed)
Reports marked symptom improvement with low dose naproxen for recurrent thoracic spine pain, , script written for limited quantity for as needed use

## 2017-02-22 NOTE — Assessment & Plan Note (Signed)
Controlled, no change in medication DASH diet and commitment to daily physical activity for a minimum of 30 minutes discussed and encouraged, as a part of hypertension management. The importance of attaining a healthy weight is also discussed.  BP/Weight 02/18/2017 02/10/2017 01/07/2017 01/01/2017 11/12/2016 09/01/2016 79/0/3833  Systolic BP 383 291 916 606 004 599 774  Diastolic BP 80 71 72 82 65 84 79  Wt. (Lbs) 192 190 194 194.08 194 192 191  BMI 32.96 32.61 33.3 33.31 33.3 32.96 32.79

## 2017-02-22 NOTE — Progress Notes (Signed)
   EVERLEE QUAKENBUSH     MRN: [id number]      DOB: 12/19/1940   HPI Ms. Pharris is here for follow up from 2 recent ED visits , on 4/18 and again on 02/10/2017 when she presented with c/o left upper back pain initial was after lifting her ill spouse who has subsequently passed. She has established disc and spine disease affecting neck and thoracic spine and this flares from time to time Reports good response to naproxen , states she prefers this to ibuprofen and requests same for as needed use. C/o increased anxiety , stress and poor sleep following her husband's passing, mainly due to family issues , requests short term anxiolytic  ROS Denies recent fever or chills. Denies sinus pressure, nasal congestion, ear pain or sore throat. Denies chest congestion, productive cough or wheezing. Denies chest pains, palpitations and leg swelling Denies abdominal pain, nausea, vomiting,diarrhea or constipation.   Denies dysuria, frequency, hesitancy or incontinence.  Denies skin break down or rash.   PE  BP 122/80   Pulse 80   Resp 16   Ht 5\' 4"  (1.626 m)   Wt 192 lb (87.1 kg)   SpO2 98%   BMI 32.96 kg/m   Patient alert and oriented and in no cardiopulmonary distress.  HEENT: No facial asymmetry, EOMI,   oropharynx pink and moist.  Neck decreed ROM no JVD, no mass.  Chest: Clear to auscultation bilaterally.Tender over left posterior chest wall CVS: S1, S2 no murmurs, no S3.Regular rate.  ABD: Soft non tender.   Ext: No edema  MS: Adequate ROM spine, shoulders, hips and knees.  Skin: Intact, no ulcerations or rash noted.  Psych: Good eye contact, normal affect. Memory intact mildly  anxious not depressed appearing.  CNS: CN 2-12 intact, power,  normal throughout.no focal deficits noted.   Assessment & Plan Thoracic spine pain Improved, recurrent , has arthritis and disc disease demonstrated in previous mRI May use naproxen sparingly, refill sent and ibuprofen discontinued. Back  exercises and back hygiene also reviewed  INSOMNIA, CHRONIC Inceased with stre of recent loss of her spouse to cancer and subsequent family issues, reports poor sleep and increased anxiety, limited supply of low dose xanax prescribed for short term as needed use  ESSENTIAL HYPERTENSION, BENIGN Controlled, no change in medication DASH diet and commitment to daily physical activity for a minimum of 30 minutes discussed and encouraged, as a part of hypertension management. The importance of attaining a healthy weight is also discussed.  BP/Weight 02/18/2017 02/10/2017 01/07/2017 01/01/2017 11/12/2016 09/01/2016 61/05/5092  Systolic BP 267 124 580 998 338 250 539  Diastolic BP 80 71 72 82 65 84 79  Wt. (Lbs) 192 190 194 194.08 194 192 191  BMI 32.96 32.61 33.3 33.31 33.3 32.96 32.79       Encounter for examination following treatment at hospital Reports marked symptom improvement with low dose naproxen for recurrent thoracic spine pain, , script written for limited quantity for as needed use

## 2017-02-22 NOTE — Assessment & Plan Note (Signed)
Inceased with stre of recent loss of her spouse to cancer and subsequent family issues, reports poor sleep and increased anxiety, limited supply of low dose xanax prescribed for short term as needed use

## 2017-02-22 NOTE — Assessment & Plan Note (Signed)
Improved, recurrent , has arthritis and disc disease demonstrated in previous mRI May use naproxen sparingly, refill sent and ibuprofen discontinued. Back exercises and back hygiene also reviewed

## 2017-04-01 ENCOUNTER — Telehealth: Payer: Self-pay | Admitting: Family Medicine

## 2017-04-01 DIAGNOSIS — M25561 Pain in right knee: Secondary | ICD-10-CM

## 2017-04-01 NOTE — Telephone Encounter (Signed)
Patient is requesting a referral to the following provider (Rheumatology) for R Knee .  She is aware that Dr. Moshe Cipro will not be in office until Monday July 16.       Dr. Sabino Niemann (with [facility]) [address] [place], [place] [address]  [phone]  Phone Number [phone] Fax Number

## 2017-04-03 NOTE — Telephone Encounter (Signed)
See note sent

## 2017-04-03 NOTE — Telephone Encounter (Signed)
I left a message. Orthopedic, not rheumatology docs are the specialists who generally take care of knees, so I recommend she see orthopedics rather than rheumatology. Left a message regarding this , asked her to call back and discuss with nurse. If she specificall wants this doc, pls enter the referral, I will sign

## 2017-04-06 NOTE — Telephone Encounter (Signed)
Referral sent 

## 2017-04-27 DIAGNOSIS — M25511 Pain in right shoulder: Secondary | ICD-10-CM | POA: Diagnosis not present

## 2017-04-27 DIAGNOSIS — M19012 Primary osteoarthritis, left shoulder: Secondary | ICD-10-CM | POA: Diagnosis not present

## 2017-04-27 DIAGNOSIS — M179 Osteoarthritis of knee, unspecified: Secondary | ICD-10-CM | POA: Diagnosis not present

## 2017-04-27 DIAGNOSIS — M797 Fibromyalgia: Secondary | ICD-10-CM | POA: Diagnosis not present

## 2017-04-27 DIAGNOSIS — M25561 Pain in right knee: Secondary | ICD-10-CM | POA: Diagnosis not present

## 2017-04-27 DIAGNOSIS — M1990 Unspecified osteoarthritis, unspecified site: Secondary | ICD-10-CM | POA: Diagnosis not present

## 2017-04-27 DIAGNOSIS — M19011 Primary osteoarthritis, right shoulder: Secondary | ICD-10-CM | POA: Diagnosis not present

## 2017-05-11 ENCOUNTER — Other Ambulatory Visit (HOSPITAL_COMMUNITY): Payer: Self-pay | Admitting: *Deleted

## 2017-05-11 DIAGNOSIS — D241 Benign neoplasm of right breast: Secondary | ICD-10-CM

## 2017-05-12 ENCOUNTER — Encounter (HOSPITAL_COMMUNITY): Payer: Medicare HMO

## 2017-05-12 ENCOUNTER — Encounter (HOSPITAL_COMMUNITY): Payer: Self-pay | Admitting: Oncology

## 2017-05-12 ENCOUNTER — Encounter (HOSPITAL_COMMUNITY): Payer: Medicare HMO | Attending: Oncology | Admitting: Oncology

## 2017-05-12 VITALS — BP 126/75 | HR 80 | Resp 18 | Ht 63.5 in | Wt 191.3 lb

## 2017-05-12 DIAGNOSIS — N951 Menopausal and female climacteric states: Secondary | ICD-10-CM

## 2017-05-12 DIAGNOSIS — Z79810 Long term (current) use of selective estrogen receptor modulators (SERMs): Secondary | ICD-10-CM

## 2017-05-12 DIAGNOSIS — D241 Benign neoplasm of right breast: Secondary | ICD-10-CM | POA: Diagnosis not present

## 2017-05-12 LAB — CBC WITH DIFFERENTIAL/PLATELET
BASOS ABS: 0 10*3/uL (ref 0.0–0.1)
BASOS PCT: 0 %
Eosinophils Absolute: 0 10*3/uL (ref 0.0–0.7)
Eosinophils Relative: 0 %
HEMATOCRIT: 37 % (ref 36.0–46.0)
HEMOGLOBIN: 12 g/dL (ref 12.0–15.0)
LYMPHS PCT: 35 %
Lymphs Abs: 2.3 10*3/uL (ref 0.7–4.0)
MCH: 30.2 pg (ref 26.0–34.0)
MCHC: 32.4 g/dL (ref 30.0–36.0)
MCV: 93 fL (ref 78.0–100.0)
Monocytes Absolute: 0.5 10*3/uL (ref 0.1–1.0)
Monocytes Relative: 9 %
NEUTROS ABS: 3.6 10*3/uL (ref 1.7–7.7)
NEUTROS PCT: 56 %
Platelets: 178 10*3/uL (ref 150–400)
RBC: 3.98 MIL/uL (ref 3.87–5.11)
RDW: 14.3 % (ref 11.5–15.5)
WBC: 6.4 10*3/uL (ref 4.0–10.5)

## 2017-05-12 LAB — COMPREHENSIVE METABOLIC PANEL
ALBUMIN: 3.7 g/dL (ref 3.5–5.0)
ALK PHOS: 47 U/L (ref 38–126)
ALT: 13 U/L — ABNORMAL LOW (ref 14–54)
AST: 19 U/L (ref 15–41)
Anion gap: 6 (ref 5–15)
BILIRUBIN TOTAL: 0.4 mg/dL (ref 0.3–1.2)
BUN: 10 mg/dL (ref 6–20)
CALCIUM: 8.9 mg/dL (ref 8.9–10.3)
CO2: 26 mmol/L (ref 22–32)
Chloride: 108 mmol/L (ref 101–111)
Creatinine, Ser: 0.96 mg/dL (ref 0.44–1.00)
GFR calc Af Amer: >60 mL/min (ref >60)
GFR, EST NON AFRICAN AMERICAN: 57 mL/min — AB (ref >60)
GLUCOSE: 99 mg/dL (ref 65–99)
Potassium: 4.1 mmol/L (ref 3.5–5.1)
Sodium: 140 mmol/L (ref 135–145)
TOTAL PROTEIN: 7.1 g/dL (ref 6.5–8.1)

## 2017-05-12 NOTE — Progress Notes (Signed)
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 female is here for follow-up of atypical ductal hyperplasia.  She underwent a R breast lumpectomy with Dr. Brantley Stage on 10/05/2014. She has been underoing diagnostic mammography for ongoing follow-up of a 1.2 x 1.4 x 0.9 cm hypoechoic lobular mass in the R breast at the 2:30 position 6 cm from the nipple.   She has been doing well on tamoxifen. She does get night sweats, but they are tolerable. She had a bilateral diagnostic mammogram on 11/18/16 which was birads 2. She gets an annual eye exam and pap smear. She has not felt any breast masses during self examination at home. Pt has chronic right lower extremity swelling where she previously had knee surgery. Denies headaches, chest pain, shortness of breath, abdominal pain, or any other concerns.   MEDICAL HISTORY:  Past Medical History:  Diagnosis Date  . Breast cancer ([place]) 2016   Right  . Chronic fatigue   . Diabetes mellitus 2011  . DJD (degenerative joint disease)    of neck    . Dyslipidemia   . Hypertension 2005  . Mild obesity   . Postmenopausal   . Wears glasses     SURGICAL HISTORY: Past Surgical History:  Procedure Laterality Date  . ABDOMINAL HYSTERECTOMY  1992   fibroids  . APPENDECTOMY    . BREAST LUMPECTOMY WITH RADIOACTIVE SEED LOCALIZATION Right 10/05/2014   Procedure: BREAST LUMPECTOMY WITH RADIOACTIVE SEED LOCALIZATION;  Surgeon: Erroll Luna, MD;  Location: [facility];  Service: General;   Laterality: Right;  . cataract Bilateral   . CYST EXCISION Right    arm  . EYE SURGERY Right 06/07/2013   cataract  . EYE SURGERY Left 09/14/2013   cataract  . KNEE ARTHROSCOPY Right 02/26/2016   Procedure: ARTHROSCOPY RIGHT KNEE WITH MENSICAL DEBRIDEMENT;  Surgeon: Gaynelle Arabian, MD;  Location: [facility];  Service: Orthopedics;  Laterality: Right;  . left breast biopsy    . LESION REMOVAL Right 03/30/2013   Procedure: EXCISION OF NEOPLASM ARM;  Surgeon: Jamesetta So, MD;  Location: [facility];  Service: General;  Laterality: Right;  . TUBAL LIGATION      SOCIAL HISTORY: Social History   Social History  . Marital status: Married    Spouse name: N/A  . Number of children: N/A  . Years of education: N/A   Occupational History  . Not on file.   Social History Main Topics  . Smoking status: Never Smoker  . Smokeless tobacco: Never Used  . Alcohol use No  . Drug use: No  . Sexual activity: Not Currently    Birth control/ protection: Surgical   Other Topics Concern  . Not on file   Social History Narrative  . No narrative on file  The patient states she is "partially married"  Or "separated" she has 2 sons, 3 grandsons and 1 great grandson. She is a never smoker and does not drink ETOH. She has worked in [employer] and in [employer].  FAMILY HISTORY: Family History  Problem Relation Age of Onset  . Hypertension Father   .  Congestive Heart Failure Father   . Liver cancer Mother   . Hypertension Sister   . Hypertension Brother   . Diabetes Brother    indicated that her mother is deceased. She indicated that her father is deceased. She indicated that her sister is alive. She indicated that all of her three brothers are alive. She indicated that both of her sons are alive.   Mother died at 67 from liver cancer, Father died at 39 from CHF.  3 Brothers and 1 sister, 2 brothers have prostate cancer.   ALLERGIES:  is allergic to codeine and propoxyphene  n-acetaminophen.  MEDICATIONS:  Current Outpatient Prescriptions  Medication Sig Dispense Refill  . acetaminophen (TYLENOL) 500 MG tablet Take 1,000 mg by mouth every 6 (six) hours as needed for mild pain.    Marland Kitchen ALPRAZolam (XANAX) 0.25 MG tablet One tablet at bedtime, as needed, for anxiety, and sleep 15 tablet 0  . aspirin EC 81 MG tablet Take 1 tablet (81 mg total) by mouth every evening. (Patient taking differently: Take 81 mg by mouth every morning. ) 90 tablet 1  . benazepril (LOTENSIN) 10 MG tablet TAKE 1 TABLET EVERY DAY 90 tablet 1  . cloNIDine (CATAPRES) 0.1 MG tablet TAKE 1 TABLET AT BEDTIME 90 tablet 1  . metFORMIN (GLUCOPHAGE-XR) 500 MG 24 hr tablet TAKE 1 TABLET TWICE DAILY (Patient taking differently: TAKE 1 TABLET DAILY) 180 tablet 1  . methocarbamol (ROBAXIN) 500 MG tablet Take 1 tablet (500 mg total) by mouth 2 (two) times daily. (Patient not taking: Reported on 02/18/2017) 14 tablet 0  . naproxen (NAPROSYN) 500 MG tablet TAKE 1 TABLET BY MOUTH TWICE DAILY WITH A MEAL AS NEEDED FOR PAIN 180 tablet 0  . tamoxifen (NOLVADEX) 20 MG tablet TAKE 1 TABLET EVERY DAY (Patient taking differently: TAKE 1 TABLET DAILY AT BEDTIME) 90 tablet 4   No current facility-administered medications for this visit.     Review of Systems  Constitutional:       Night sweats  HENT: Negative.   Eyes: Negative.   Respiratory: Negative.   Cardiovascular: Negative for leg swelling.  Gastrointestinal: Negative.  Negative for abdominal pain.  Genitourinary: Negative.   Musculoskeletal: Negative.   Skin: Negative.   Neurological: Negative.   Endo/Heme/Allergies: Negative.   Psychiatric/Behavioral: Negative.   All other systems reviewed and are negative. 14 point review of systems was performed and is negative except as detailed under history of present illness and above  PHYSICAL EXAMINATION: ECOG PERFORMANCE STATUS: 0 - Asymptomatic  Vitals:   05/12/17 1120  BP: 126/75  Pulse: 80  Resp: 18   SpO2: 100%   Filed Weights   05/12/17 1120  Weight: 191 lb 4.8 oz (86.8 kg)   Physical Exam  Constitutional: She is oriented to person, place, and time and well-developed, well-nourished, and in no distress.  HENT:  Head: Normocephalic and atraumatic.  Eyes: Pupils are equal, round, and reactive to light. EOM are normal.  Neck: Normal range of motion. Neck supple.  Cardiovascular: Normal rate, regular rhythm and normal heart sounds.   Pulmonary/Chest: Effort normal and breath sounds normal.    Abdominal: Soft. Bowel sounds are normal.  Musculoskeletal: Normal range of motion.  Neurological: She is alert and oriented to person, place, and time. Gait normal.  Skin: Skin is warm and dry.  Nursing note and vitals reviewed.  LABORATORY DATA:  I have reviewed the data as listed Lab Results  Component Value Date   WBC 6.4 05/12/2017  HGB 12.0 05/12/2017   HCT 37.0 05/12/2017   MCV 93.0 05/12/2017   PLT 178 05/12/2017     Chemistry      Component Value Date/Time   NA 140 05/12/2017 1046   K 4.1 05/12/2017 1046   CL 108 05/12/2017 1046   CO2 26 05/12/2017 1046   BUN 10 05/12/2017 1046   CREATININE 0.96 05/12/2017 1046   CREATININE 0.99 (H) 12/31/2016 0931      Component Value Date/Time   CALCIUM 8.9 05/12/2017 1046   ALKPHOS 47 05/12/2017 1046   AST 19 05/12/2017 1046   ALT 13 (L) 05/12/2017 1046   BILITOT 0.4 05/12/2017 1046      RADIOGRAPHIC STUDIES: I have personally reviewed the radiological images as listed in HPI and agreed with the findings in the report. MM DIAG BREAST TOMO BILATERAL (Accession [id number]) (Order [id number])  Imaging  Date: 11/18/2016 Department: [facility] Released By: Georgianne Fick Authorizing: Fayrene Helper, MD  Exam Information   Status Exam Begun  Exam Ended   Final [99] 11/18/2016 2:06 PM 11/18/2016 2:22 PM  Study Result   CLINICAL DATA:  Annual examination. The patient had a right breast lumpectomy in January  2016 showing atypical ductal hyperplasia and a papilloma. See currently takes tamoxifen for her history of high risk breast lesions. She is asymptomatic.  EXAM: 2D DIGITAL DIAGNOSTIC BILATERAL MAMMOGRAM WITH CAD AND ADJUNCT TOMO  COMPARISON:  Previous exam(s).  ACR Breast Density Category b: There are scattered areas of fibroglandular density.  FINDINGS: There stable postsurgical changes in the lower inner quadrant of the right breast. Scattered bilateral breast nodules appear stable. No new or suspicious mass, nonsurgical distortion, or suspicious microcalcification is identified in either breast to suggest malignancy.  Mammographic images were processed with CAD.  IMPRESSION: No evidence of malignancy in either breast. Postsurgical changes in the lower inner quadrant of the right breast.  RECOMMENDATION: Diagnostic mammogram is suggested in 1 year. (Code:[id number]-B-01Y)  I have discussed the findings and recommendations with the patient. Results were also provided in writing at the conclusion of the visit. If applicable, a reminder letter will be sent to the patient regarding the next appointment.  BI-RADS CATEGORY  2: Benign.      CT Abdomen Pelvis w/ Contrast 03/31/2016 IMPRESSION: 1. Mild acute diverticulitis of the sigmoid colon. 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 Brantley Stage on 10/05/2014 High Risk Breast Disease Tamoxifen for chemoprevention Hot flashes not improved with Effexor or Gabapentin  Clinically NED on exam today.  Repeat bilateral diagnostic mammogram in Feb 2019.  Continue tamoxifen. Advised patient to continue getting annual eye and gynecologic exams, which she is already doing.  She will return for follow up in 6 months with CBC and CMP.   Orders Placed This Encounter  Procedures   . CBC with Differential    Standing Status:   Future    Standing Expiration Date:   05/12/2018  . Comprehensive metabolic panel    Standing Status:   Future    Standing Expiration Date:   05/12/2018    All questions were answered. The patient knows to call the clinic with any problems, questions or concerns.    This note was electronically signed.  Twana First, MD

## 2017-05-19 ENCOUNTER — Ambulatory Visit (INDEPENDENT_AMBULATORY_CARE_PROVIDER_SITE_OTHER): Payer: Medicare HMO | Admitting: Family Medicine

## 2017-05-19 ENCOUNTER — Encounter: Payer: Self-pay | Admitting: Family Medicine

## 2017-05-19 VITALS — BP 120/84 | HR 85 | Resp 16 | Ht 64.0 in | Wt 191.8 lb

## 2017-05-19 DIAGNOSIS — E669 Obesity, unspecified: Secondary | ICD-10-CM

## 2017-05-19 DIAGNOSIS — E1121 Type 2 diabetes mellitus with diabetic nephropathy: Secondary | ICD-10-CM

## 2017-05-19 DIAGNOSIS — E785 Hyperlipidemia, unspecified: Secondary | ICD-10-CM | POA: Diagnosis not present

## 2017-05-19 DIAGNOSIS — N951 Menopausal and female climacteric states: Secondary | ICD-10-CM

## 2017-05-19 DIAGNOSIS — I10 Essential (primary) hypertension: Secondary | ICD-10-CM | POA: Diagnosis not present

## 2017-05-19 DIAGNOSIS — Z23 Encounter for immunization: Secondary | ICD-10-CM

## 2017-05-19 DIAGNOSIS — E66811 Obesity, class 1: Secondary | ICD-10-CM

## 2017-05-19 MED ORDER — VENLAFAXINE HCL ER 37.5 MG PO CP24: 37.5000 mg | ORAL_CAPSULE | Freq: Every day | ORAL | 2 refills | Status: DC

## 2017-05-19 NOTE — Patient Instructions (Addendum)
Annual physical exam Nov 7 or after, call if ou needme sooner  Microalb today  Flu vaccine today  Fasting lipid, HBA1C and TSH 1 week before next visit  It is important that you exercise regularly at least 30 minutes 5 times a week. If you develop chest pain, have severe difficulty breathing, or feel very tired, stop exercising immediately and seek medical attention   New for hot flashes and energy es venlafaxine 1 daily   Thank you  for choosing [facility]. We consider it a privelige to serve you.  Delivering excellent health care in a caring and  compassionate way is our goal.  Partnering with you,  so that together we can achieve this goal is our strategy.

## 2017-05-20 ENCOUNTER — Other Ambulatory Visit (HOSPITAL_COMMUNITY)
Admission: RE | Admit: 2017-05-20 | Discharge: 2017-05-20 | Disposition: A | Discharge status: Home / Self Care | Payer: Medicare HMO | Source: Other Acute Inpatient Hospital | Attending: Family Medicine | Admitting: Family Medicine

## 2017-05-20 DIAGNOSIS — E1121 Type 2 diabetes mellitus with diabetic nephropathy: Secondary | ICD-10-CM | POA: Insufficient documentation

## 2017-05-20 NOTE — Assessment & Plan Note (Signed)
Unchanged Patient re-educated about  the importance of commitment to a  minimum of 150 minutes of exercise per week.  The importance of healthy food choices with portion control discussed. Encouraged to start a food diary, count calories and to consider  joining a support group. Sample diet sheets offered. Goals set by the patient for the next several months.   Weight /BMI 05/19/2017 05/12/2017 02/18/2017  WEIGHT 191 lb 12.8 oz 191 lb 4.8 oz 192 lb  HEIGHT 5\' 4"  5' 3.5" 5\' 4"   BMI 32.92 kg/m2 33.36 kg/m2 32.96 kg/m2

## 2017-05-20 NOTE — Assessment & Plan Note (Signed)
Increased and uncontrolled disturbs sleep awakens unrested, start effexor, continue behavior modification, fan, light dressing , cool drinks

## 2017-05-20 NOTE — Progress Notes (Signed)
TAMIEKA RANCOURT     MRN: [id number]      DOB: 1940/12/05   HPI Ms. Bleecker is here for follow up and re-evaluation of chronic medical conditions, medication management and review of any available recent lab and radiology data.  Preventive health is updated, specifically  Cancer screening and Immunization.   Questions or concerns regarding consultations or procedures which the PT has had in the interim are  Addressed.Was evaluated by rheumatology, had labs drawn , no new meds, states feels better and she has a f/u appt The PT denies any adverse reactions to current medications since the last visit.  C/o increased hot flashes , when she wakes up she feels as though she has had no rest   ROS Denies recent fever or chills. Denies sinus pressure, nasal congestion, ear pain or sore throat. Denies chest congestion, productive cough or wheezing. Denies chest pains, palpitations and leg swelling Denies abdominal pain, nausea, vomiting,diarrhea or constipation.   Denies dysuria, frequency, hesitancy or incontinence. Denies joint pain, swelling and limitation in mobility. Denies headaches, seizures, numbness, or tingling. Denies  Uncontrolled depression, anxiety or insomnia. Denies skin break down or rash.   PE  BP 120/84   Pulse 85   Resp 16   Ht 5\' 4"  (1.626 m)   Wt 191 lb 12.8 oz (87 kg)   SpO2 98%   BMI 32.92 kg/m   Patient alert and oriented and in no cardiopulmonary distress.  HEENT: No facial asymmetry, EOMI,   oropharynx pink and moist.  Neck supple no JVD, no mass.  Chest: Clear to auscultation bilaterally.  CVS: S1, S2 no murmurs, no S3.Regular rate.  ABD: Soft non tender.   Ext: No edema  MS: Adequate ROM spine, shoulders, hips and knees.  Skin: Intact, no ulcerations or rash noted.  Psych: Good eye contact, normal affect. Memory intact not anxious or depressed appearing.  CNS: CN 2-12 intact, power,  normal throughout.no focal deficits noted.   Assessment &  Plan Hot flashes, menopausal Increased and uncontrolled disturbs sleep awakens unrested, start effexor, continue behavior modification, fan, light dressing , cool drinks  ESSENTIAL HYPERTENSION, BENIGN Controlled, no change in medication DASH diet and commitment to daily physical activity for a minimum of 30 minutes discussed and encouraged, as a part of hypertension management. The importance of attaining a healthy weight is also discussed.  BP/Weight 05/19/2017 05/12/2017 02/18/2017 02/10/2017 01/07/2017 01/01/2017 0/34/7425  Systolic BP 956 387 564 332 951 884 166  Diastolic BP 84 75 80 71 72 82 65  Wt. (Lbs) 191.8 191.3 192 190 194 194.08 194  BMI 32.92 33.36 32.96 32.61 33.3 33.31 33.3       Controlled diabetes mellitus with nephropathy Ms. Strub is reminded of the importance of commitment to daily physical activity for 30 minutes or more, as able and the need to limit carbohydrate intake to 30 to 60 grams per meal to help with blood sugar control.   The need to take medication as prescribed, test blood sugar as directed, and to call between visits if there is a concern that blood sugar is uncontrolled is also discussed.   Ms. Espiritu is reminded of the importance of daily foot exam, annual eye examination, and good blood sugar, blood pressure and cholesterol control. Updated lab needed at/ before next visit.   Diabetic Labs Latest Ref Rng & Units 05/12/2017 12/31/2016 07/25/2016 04/02/2016 03/31/2016  HbA1c <5.7 % - 6.2(H) 6.4(H) - -  Microalbumin Not Estab. ug/mL - - -  12.7(H) -  Micro/Creat Ratio 0.0 - 30.0 mg/g - - - - -  Chol <200 mg/dL - 136 144 - -  HDL >50 mg/dL - 55 51 - -  Calc LDL <100 mg/dL - 62 70 - -  Triglycerides <150 mg/dL - 96 115 - -  Creatinine 0.44 - 1.00 mg/dL 0.96 0.99(H) 0.98(H) - 0.86   BP/Weight 05/19/2017 05/12/2017 02/18/2017 02/10/2017 01/07/2017 01/01/2017 2/33/0076  Systolic BP 226 333 545 625 638 937 342  Diastolic BP 84 75 80 71 72 82 65  Wt. (Lbs)  191.8 191.3 192 190 194 194.08 194  BMI 32.92 33.36 32.96 32.61 33.3 33.31 33.3   Foot/eye exam completion dates Latest Ref Rng & Units 07/28/2016 07/24/2016  Eye Exam No Retinopathy - Retinopathy(A)  Foot exam Order - - -  Foot Form Completion - Done -        Obesity (BMI 30.0-34.9) Unchanged Patient re-educated about  the importance of commitment to a  minimum of 150 minutes of exercise per week.  The importance of healthy food choices with portion control discussed. Encouraged to start a food diary, count calories and to consider  joining a support group. Sample diet sheets offered. Goals set by the patient for the next several months.   Weight /BMI 05/19/2017 05/12/2017 02/18/2017  WEIGHT 191 lb 12.8 oz 191 lb 4.8 oz 192 lb  HEIGHT 5\' 4"  5' 3.5" 5\' 4"   BMI 32.92 kg/m2 33.36 kg/m2 32.96 kg/m2      Hyperlipidemia with target LDL less than 100 Hyperlipidemia:Low fat diet discussed and encouraged.   Lipid Panel  Lab Results  Component Value Date   CHOL 136 12/31/2016   HDL 55 12/31/2016   LDLCALC 62 12/31/2016   TRIG 96 12/31/2016   CHOLHDL 2.5 12/31/2016   At goal

## 2017-05-20 NOTE — Assessment & Plan Note (Signed)
Jillian [name] is reminded of the importance of commitment to daily physical activity for 30 minutes or more, as able and the need to limit carbohydrate intake to 30 to 60 grams per meal to help with blood sugar control.   The need to take medication as prescribed, test blood sugar as directed, and to call between visits if there is a concern that blood sugar is uncontrolled is also discussed.   Jillian [name] is reminded of the importance of daily foot exam, annual eye examination, and good blood sugar, blood pressure and cholesterol control. Updated lab needed at/ before next visit.   Diabetic Labs Latest Ref Rng & Units 05/12/2017 12/31/2016 07/25/2016 04/02/2016 03/31/2016  HbA1c <5.7 % - 6.2(H) 6.4(H) - -  Microalbumin Not Estab. ug/mL - - - 12.7(H) -  Micro/Creat Ratio 0.0 - 30.0 mg/g - - - - -  Chol <200 mg/dL - 136 144 - -  HDL >50 mg/dL - 55 51 - -  Calc LDL <100 mg/dL - 62 70 - -  Triglycerides <150 mg/dL - 96 115 - -  Creatinine 0.44 - 1.00 mg/dL 0.96 0.99(H) 0.98(H) - 0.86   BP/Weight 05/19/2017 05/12/2017 02/18/2017 02/10/2017 01/07/2017 01/01/2017 6/96/7893  Systolic BP 810 175 102 585 277 824 235  Diastolic BP 84 75 80 71 72 82 65  Wt. (Lbs) 191.8 191.3 192 190 194 194.08 194  BMI 32.92 33.36 32.96 32.61 33.3 33.31 33.3   Foot/eye exam completion dates Latest Ref Rng & Units 07/28/2016 07/24/2016  Eye Exam No Retinopathy - Retinopathy(A)  Foot exam Order - - -  Foot Form Completion - Done -

## 2017-05-20 NOTE — Assessment & Plan Note (Signed)
Controlled, no change in medication DASH diet and commitment to daily physical activity for a minimum of 30 minutes discussed and encouraged, as a part of hypertension management. The importance of attaining a healthy weight is also discussed.  BP/Weight 05/19/2017 05/12/2017 02/18/2017 02/10/2017 01/07/2017 01/01/2017 5/75/0518  Systolic BP 335 825 189 842 103 128 118  Diastolic BP 84 75 80 71 72 82 65  Wt. (Lbs) 191.8 191.3 192 190 194 194.08 194  BMI 32.92 33.36 32.96 32.61 33.3 33.31 33.3

## 2017-05-20 NOTE — Assessment & Plan Note (Signed)
Hyperlipidemia:Low fat diet discussed and encouraged.   Lipid Panel  Lab Results  Component Value Date   CHOL 136 12/31/2016   HDL 55 12/31/2016   LDLCALC 62 12/31/2016   TRIG 96 12/31/2016   CHOLHDL 2.5 12/31/2016   At goal

## 2017-05-21 LAB — MICROALBUMIN / CREATININE URINE RATIO
Creatinine, Urine: 207.8 mg/dL
MICROALB UR: 3.6 ug/mL — AB
Microalb Creat Ratio: 1.7 mg/g creat (ref 0.0–30.0)

## 2017-06-18 ENCOUNTER — Other Ambulatory Visit: Payer: Self-pay

## 2017-06-18 MED ORDER — VENLAFAXINE HCL ER 37.5 MG PO CP24: 37.5000 mg | ORAL_CAPSULE | Freq: Every day | ORAL | 2 refills | Status: DC

## 2017-06-30 DIAGNOSIS — M1990 Unspecified osteoarthritis, unspecified site: Secondary | ICD-10-CM | POA: Diagnosis not present

## 2017-06-30 DIAGNOSIS — M25511 Pain in right shoulder: Secondary | ICD-10-CM | POA: Diagnosis not present

## 2017-06-30 DIAGNOSIS — M25561 Pain in right knee: Secondary | ICD-10-CM | POA: Diagnosis not present

## 2017-06-30 DIAGNOSIS — M797 Fibromyalgia: Secondary | ICD-10-CM | POA: Diagnosis not present

## 2017-07-14 ENCOUNTER — Other Ambulatory Visit: Payer: Self-pay | Admitting: Family Medicine

## 2017-07-15 ENCOUNTER — Ambulatory Visit (HOSPITAL_COMMUNITY): Payer: Medicare HMO | Attending: Rheumatology | Admitting: Physical Therapy

## 2017-07-15 DIAGNOSIS — M25562 Pain in left knee: Secondary | ICD-10-CM | POA: Diagnosis not present

## 2017-07-15 DIAGNOSIS — M25561 Pain in right knee: Secondary | ICD-10-CM | POA: Diagnosis not present

## 2017-07-15 DIAGNOSIS — G8929 Other chronic pain: Secondary | ICD-10-CM | POA: Diagnosis not present

## 2017-07-15 DIAGNOSIS — R2681 Unsteadiness on feet: Secondary | ICD-10-CM | POA: Insufficient documentation

## 2017-07-15 NOTE — Patient Instructions (Addendum)
Stretching: Hamstring (Supine)   Use a towel behind your thigh Supporting right thigh behind knee, slowly straighten knee until stretch is felt in back of thigh. Hold __30__ seconds. Repeat _2___ times per set. Do ___1_ sets per session. Do 2____ sessions per day.  http://orth.exer.us/656   Copyright  VHI. All rights reserved.  Strengthening: Terminal Knee Extension (Supine)    With right knee over a small towel roll, straighten knee by tightening muscles on top of thigh. Keep bottom of knee on bolster. Repeat _10-15___ times per set. Do __1__ sets per session. Do _1-2___ sessions per day.  http://orth.exer.us/626   Copyright  VHI. All rights reserved.  Balance: Unilateral   At the kitchen or bathroom counter Attempt to balance on left leg, eyes open. Hold 30-45____ seconds. Repeat ___3_ times per set. Do ____ sets per session. Do __2__ sessions per day. Perform exercise with eyes closed.1  http://orth.exer.us/28   Copyright  VHI. All rights reserved.  Heel Raise: Bilateral (Standing)   At the kitchen counter Rise on balls of feet.  Make sure you go straight up and not forward  Repeat __10__ times per set. Do _1___ sets per session. Do ___2_ sessions per day.  http://orth.exer.us/38   Copyright  VHI. All rights reserved.  Functional Quadriceps: Chair Squat    Keeping feet flat on floor, shoulder width apart, squat as low as is comfortable. Use support as necessary.  Hold for 3-10 seconds as able  Repeat __10__ times per set. Do _1___ sets per session. Do __2__ sessions per day.  http://orth.exer.us/736   Copyright  VHI. All rights reserved.  Step-Down / Step-Up    Stand on stair step or ____ inch stool. Slowly bend left leg, lowering other foot to floor. Return by straightening front leg. Repeat ____ times per set. Do ____ sets per session. Do ____ sessions per day.  http://orth.exer.us/684   Copyright  VHI. All rights reserved.

## 2017-07-15 NOTE — Therapy (Signed)
Jillian [name], [place], [address] Phone: [phone]   Fax:  [phone]  Physical Therapy Evaluation  Patient Details  Name: Jillian [name] MRN: [id number] Date of Birth: 21-Nov-1940 Referring Provider: Sabino [name]  Encounter Date: 07/15/2017      PT End of Session - 07/15/17 0942    Visit Number 1   Number of Visits 4   Date for PT Re-Evaluation 08/14/17   Authorization Type [employer]   Authorization - Visit Number 1   Authorization - Number of Visits 4   PT Start Time 0905   PT Stop Time 0945   PT Time Calculation (min) 40 min      Past Medical History:  Diagnosis Date  . Breast cancer ([place]) 2016   Right  . Chronic fatigue   . Diabetes mellitus 2011  . DJD (degenerative joint disease)    of neck    . Dyslipidemia   . Hypertension 2005  . Mild obesity   . Postmenopausal   . Wears glasses     Past Surgical History:  Procedure Laterality Date  . ABDOMINAL HYSTERECTOMY  1992   fibroids  . APPENDECTOMY    . BREAST LUMPECTOMY WITH RADIOACTIVE SEED LOCALIZATION Right 10/05/2014   Procedure: BREAST LUMPECTOMY WITH RADIOACTIVE SEED LOCALIZATION;  Surgeon: Erroll Luna, MD;  Location: [facility];  Service: General;  Laterality: Right;  . cataract Bilateral   . CYST EXCISION Right    arm  . EYE SURGERY Right 06/07/2013   cataract  . EYE SURGERY Left 09/14/2013   cataract  . KNEE ARTHROSCOPY Right 02/26/2016   Procedure: ARTHROSCOPY RIGHT KNEE WITH MENSICAL DEBRIDEMENT;  Surgeon: Gaynelle Arabian, MD;  [facility];  Service: Orthopedics;  Laterality: Right;  . left breast biopsy    . LESION REMOVAL Right 03/30/2013   Procedure: EXCISION OF NEOPLASM ARM;  Surgeon: Jamesetta So, MD;  [facility];  Service: General;  Laterality: Right;  . TUBAL LIGATION      There were no vitals filed for this visit.       Subjective Assessment - 07/15/17 0900    Subjective Jillian [name] states that she has been having knee pain for over a year but it is getting to the point where she is having difficuly getting up from a chair.  As long as she is moving around she is alright.    Pertinent History neck pain, back pain, hip pain, DM , HTN   How long can you sit comfortably? no problem just going sit to stand is the problem    How long can you stand comfortably? no problem   How long can you walk comfortably? no problem    Patient Stated Goals to be able to go from stand to sit and sit to stand easier, to be able to go up and down steps and inclines better.     Currently in Pain? No/denies  Gets up to a 10/10 in front.    Pain Descriptors / Indicators Aching;Throbbing   Pain Type Chronic pain   Pain Onset More than a month ago   Pain Frequency Intermittent   Aggravating Factors  going from sit to stand and stand to sit.    Pain Relieving Factors ibuprofen    Effect of Pain on Daily Activities as long as she is moving she is alright            [facility] PT Assessment -  07/15/17 0001      Assessment   Medical Diagnosis B knee pain   Referring Provider Jillian [name]   Onset Date/Surgical Date 05/23/17   Next MD Visit unschedule    Prior Therapy none     Precautions   Precautions None     Restrictions   Weight Bearing Restrictions No     Balance Screen   Has the patient fallen in the past 6 months Yes   How many times? 1   Has the patient had a decrease in activity level because of a fear of falling?  No   Is the patient reluctant to leave their home because of a fear of falling?  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    Leisure no problems     Cognition   Overall Cognitive Status Within Functional Limits for tasks assessed     Observation/Other Assessments    Focus on Therapeutic Outcomes (FOTO)  53     Functional Tests   Functional tests Sit to Stand;Single leg stance     Single Leg Stance   Comments Rt:  6"  Lt:  12"     Sit to Stand   Comments 5 x 14.10     ROM / Strength   AROM / PROM / Strength Strength     Strength   Strength Assessment Site Ankle;Knee;Hip   Right/Left Hip Right;Left   Right Hip Flexion 5/5   Right Hip Extension 4+/5   Right Hip ABduction 5/5   Left Hip Flexion 5/5   Left Hip Extension 4+/5   Left Hip ABduction 4/5   Right/Left Knee Right;Left   Right Knee Flexion 4+/5   Right Knee Extension 5/5   Left Knee Flexion 4+/5   Left Knee Extension 5/5   Right/Left Ankle Right;Left   Right Ankle Dorsiflexion 5/5   Left Ankle Dorsiflexion 5/5     Flexibility   Soft Tissue Assessment /Muscle Length yes   Hamstrings LT : 170; Rt 158     Palpation   Patella mobility good             Objective measurements completed on examination: See above findings.          [facility] Adult PT Treatment/Exercise - 07/15/17 0001      Exercises   Exercises Knee/Hip     Knee/Hip Exercises: Stretches   Active Hamstring Stretch Both;30 seconds     Knee/Hip Exercises: Standing   Heel Raises Both;10 reps   Forward Step Up Both;Hand Hold: 1;Step Height: 6";5 reps   Functional Squat 10 reps                PT Education - 07/15/17 0941    Education provided Yes   Education Details HEP   Person(s) Educated Patient   Methods Explanation;Demonstration;Verbal cues;Handout   Comprehension Verbalized understanding;Returned demonstration          PT Short Term Goals - 07/15/17 1005      PT SHORT TERM GOAL #1   Title Pt knee pain no greater than a 7/10 to allow pt to come from sit to stand and stand to sit 50% easier.    Time 2   Period Weeks   Status New   Target Date 07/29/17  PT SHORT TERM GOAL #2   Title Pt to be completing her HEP on a regular basis to obtain goal of decreased knee pain with  functional tasks.    Time 2   Period Weeks   Status New           PT Long Term Goals - 07/15/17 1021      PT LONG TERM GOAL #1   Title PT to be completing advance HEP to allow pt to be able to go up and down steps without increased knee discomfort.   Time 4   Period Weeks   Status New   Target Date 08/12/17     PT LONG TERM GOAL #2   Title PT to be able to get in and out of a couch without increased knee pain    Time 4   Period Weeks   Status New     PT LONG TERM GOAL #3   Title Pt single leg balance to be at least 20 seconds to decrease pt risk of falling    Time 4   Period Weeks   Status New                Plan - 07/15/17 0017    Clinical Impression Statement Ms. Massaro is a 76 yo female who has had chronic knee pain but has recently noted that the pain is interferring with her ability to get up and down from chairs.  She went to the MD who stated that there was not much to be done but wanted her to try physical therapy.  Evaluation demonstrates poor balance, good strength and average flexiblity.  Ms. Rodden will be seen once a week to progress her with a HEP to improve the functional ability of her knees.    History and Personal Factors relevant to plan of care: OAQ, DM ,HTM   Clinical Presentation Stable   Clinical Decision Making Low   Rehab Potential Good   PT Frequency 1x / week   PT Duration 4 weeks   PT Treatment/Interventions ADLs/Self Care Home Management;Patient/family education;Therapeutic exercise;Therapeutic activities;Functional mobility training   PT Next Visit Plan Begin lateral step ups, step downs, sit to stand, lunges and tandem stance give as a HEP as pt will only be seen once a week   PT Home Exercise Plan Eval:  terminal extension, hamstring stretch, heel raise, SLS, minisquat and forward step ups.       Patient will benefit from skilled therapeutic intervention in order to improve the following deficits and impairments:  Pain,  Decreased activity tolerance, Decreased balance  Visit Diagnosis: Chronic pain of right knee - Plan: PT plan of care cert/re-cert  Chronic pain of left knee - Plan: PT plan of care cert/re-cert  Unsteadiness on feet - Plan: PT plan of care cert/re-cert      G-Codes - 49/44/96 1024    Functional Limitation Mobility: Walking and moving around   Mobility: Walking and Moving Around Current Status [phone]) At least 40 percent but less than 60 percent impaired, limited or restricted   Mobility: Walking and Moving Around Goal Status [phone]) At least 20 percent but less than 40 percent impaired, limited or restricted       Problem List Patient Active Problem List   Diagnosis Date Noted  . Chronic left SI joint pain 05/21/2016  . Primary osteoarthritis of both knees 06/26/2015  . 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 Thoracic spine pain 09/28/2014  . Hip pain 08/06/2012  .  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 Hot flashes, menopausal 09/10/2011  . 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 INSOMNIA, CHRONIC 11/28/2009  . Hyperlipidemia with target LDL less than 100 10/12/2007  . Obesity (BMI 30.0-34.9) 10/12/2007  . ESSENTIAL HYPERTENSION, BENIGN 10/12/2007    Rayetta Humphrey, PT CLT [phone] 07/15/2017, 10:28 AM  [facility] [address] [place], [place], [address] Phone: [phone]   Fax:  [phone]  Name: Jillian [name] MRN: [id number] Date of Birth: August 30, 1941

## 2017-07-21 ENCOUNTER — Ambulatory Visit (HOSPITAL_COMMUNITY): Payer: Medicare HMO | Admitting: Physical Therapy

## 2017-07-21 ENCOUNTER — Telehealth (HOSPITAL_COMMUNITY): Payer: Self-pay | Admitting: Physical Therapy

## 2017-07-21 NOTE — Telephone Encounter (Signed)
Pt did not show for appointment.  Called and left voicemail regarding missed appointment and reminder for next one on 11/7 at 9:00 am Teena Irani, PTA/CLT [phone]

## 2017-07-29 ENCOUNTER — Ambulatory Visit (HOSPITAL_COMMUNITY): Payer: Medicare HMO | Attending: Rheumatology | Admitting: Physical Therapy

## 2017-07-29 ENCOUNTER — Telehealth (HOSPITAL_COMMUNITY): Payer: Self-pay | Admitting: Physical Therapy

## 2017-07-29 DIAGNOSIS — R2681 Unsteadiness on feet: Secondary | ICD-10-CM | POA: Insufficient documentation

## 2017-07-29 DIAGNOSIS — M25561 Pain in right knee: Secondary | ICD-10-CM | POA: Insufficient documentation

## 2017-07-29 DIAGNOSIS — G8929 Other chronic pain: Secondary | ICD-10-CM | POA: Insufficient documentation

## 2017-07-29 DIAGNOSIS — M25562 Pain in left knee: Secondary | ICD-10-CM | POA: Insufficient documentation

## 2017-07-29 NOTE — Telephone Encounter (Signed)
Pt did not show for appointment (NS #2).  Called and left message for pt who returned called immediately following. States she was not able to come without explanation given.  Reminded of next appointment and asked to call prior to scheduled time if she could not make it. Teena Irani, PTA/CLT [phone]

## 2017-08-05 ENCOUNTER — Encounter (HOSPITAL_COMMUNITY): Payer: Self-pay | Admitting: Physical Therapy

## 2017-08-05 ENCOUNTER — Other Ambulatory Visit: Payer: Self-pay

## 2017-08-05 ENCOUNTER — Ambulatory Visit (HOSPITAL_COMMUNITY): Payer: Medicare HMO | Admitting: Physical Therapy

## 2017-08-05 DIAGNOSIS — M25561 Pain in right knee: Secondary | ICD-10-CM | POA: Diagnosis not present

## 2017-08-05 DIAGNOSIS — R2681 Unsteadiness on feet: Secondary | ICD-10-CM

## 2017-08-05 DIAGNOSIS — M25562 Pain in left knee: Secondary | ICD-10-CM | POA: Diagnosis not present

## 2017-08-05 DIAGNOSIS — G8929 Other chronic pain: Secondary | ICD-10-CM

## 2017-08-05 NOTE — Therapy (Signed)
[facility] [place], [place], [address] Phone: [phone]   Fax:  [phone]  Physical Therapy Treatment  Patient Details  Name: Jillian [name] MRN: [id number] Date of Birth: 1941-05-18 Referring Provider: Sabino Niemann   Encounter Date: 08/05/2017  PT End of Session - 08/05/17 1023    Visit Number  2    Number of Visits  4    Date for PT Re-Evaluation  08/14/17    Authorization Type  [employer]    Authorization - Visit Number  2    Authorization - Number of Visits  4    PT Start Time  0950    PT Stop Time  1028    PT Time Calculation (min)  38 min    Activity Tolerance  Patient tolerated treatment well    Behavior During Therapy  [facility] for tasks assessed/performed       Past Medical History:  Diagnosis Date  . Breast cancer ([place]) 2016   Right  . Chronic fatigue   . Diabetes mellitus 2011  . DJD (degenerative joint disease)    of neck    . Dyslipidemia   . Hypertension 2005  . Mild obesity   . Postmenopausal   . Wears glasses     Past Surgical History:  Procedure Laterality Date  . ABDOMINAL HYSTERECTOMY  1992   fibroids  . APPENDECTOMY    . cataract Bilateral   . CYST EXCISION Right    arm  . EYE SURGERY Right 06/07/2013   cataract  . EYE SURGERY Left 09/14/2013   cataract  . left breast biopsy    . TUBAL LIGATION      There were no vitals filed for this visit.  Subjective Assessment - 08/05/17 0948    Subjective  Pt staes that she has been trying to do her exercises but she is having knee pain; states the single leg stance is the worst.     Pertinent History  neck pain, back pain, hip pain, DM , HTN    How long can you sit comfortably?  no problem just going sit to stand is the problem     How long can you stand comfortably?  no problem    How long can you walk comfortably?  no problem     Patient Stated Goals  to be able to go from stand to sit and sit to stand easier, to be able to go  up and down steps and inclines better.      Currently in Pain?  No/denies worst pain in the past couple of days is a 8/10     Pain Onset  More than a month ago                      [facility] Adult PT Treatment/Exercise - 08/05/17 0001      Exercises   Exercises  Knee/Hip      Knee/Hip Exercises: Stretches   Active Hamstring Stretch  Both;3 reps;30 seconds      Knee/Hip Exercises: Standing   Heel Raises  Both;10 reps    Knee Flexion  Both;10 reps    Forward Lunges  Both;10 reps    Lateral Step Up  Both;10 reps    Forward Step Up  Both;Hand Hold: 1;Step Height: 6";5 reps    Functional Squat  10 reps    Rocker Board  2 minutes    SLS  5x each  Other Standing Knee Exercises  tandem stance x 30"       Knee/Hip Exercises: Supine   Quad Sets  Both;10 reps               PT Short Term Goals - 08/05/17 1025      PT SHORT TERM GOAL #1   Title  Pt knee pain no greater than a 7/10 to allow pt to come from sit to stand and stand to sit 50% easier.     Time  2    Period  Weeks    Status  On-going      PT SHORT TERM GOAL #2   Title  Pt to be completing her HEP on a regular basis to obtain goal of decreased knee pain with functional tasks.     Time  2    Period  Weeks    Status  On-going        PT Long Term Goals - 08/05/17 1025      PT LONG TERM GOAL #1   Title  PT to be completing advance HEP to allow pt to be able to go up and down steps without increased knee discomfort.    Time  4    Period  Weeks    Status  On-going      PT LONG TERM GOAL #2   Title  PT to be able to get in and out of a couch without increased knee pain     Time  4    Period  Weeks    Status  On-going      PT LONG TERM GOAL #3   Title  Pt single leg balance to be at least 20 seconds to decrease pt risk of falling     Time  4    Period  Weeks    Status  On-going            Plan - 08/05/17 1023    Clinical Impression Statement  Evaluation and goals reviewed with pt .   Added new exercises and printed off as a HEP.  Pt able to complete all exercises with good technique with verbal and manual cuingl      Rehab Potential  Good    PT Frequency  1x / week    PT Duration  4 weeks    PT Treatment/Interventions  ADLs/Self Care Home Management;Patient/family education;Therapeutic exercise;Therapeutic activities;Functional mobility training    PT Next Visit Plan  Begin sit to stand and tandem stance give as a HEP as pt will only be seen once a week    PT Home Exercise Plan  Eval:  terminal extension, hamstring stretch, heel raise, SLS, minisquat and forward step ups.        Patient will benefit from skilled therapeutic intervention in order to improve the following deficits and impairments:  Pain, Decreased activity tolerance, Decreased balance  Visit Diagnosis: Chronic pain of right knee  Chronic pain of left knee  Unsteadiness on feet     Problem List Patient Active Problem List   Diagnosis Date Noted  . Chronic left SI joint pain 05/21/2016  . Primary osteoarthritis of both knees 06/26/2015  . 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 Thoracic spine pain 09/28/2014  . Hip pain 08/06/2012  . 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 Hot flashes, menopausal 09/10/2011  . 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 INSOMNIA, CHRONIC 11/28/2009  . Hyperlipidemia with target  LDL less than 100 10/12/2007  . Obesity (BMI 30.0-34.9) 10/12/2007  . ESSENTIAL HYPERTENSION, BENIGN 10/12/2007  Rayetta Humphrey, PT CLT [phone] 08/05/2017, 10:29 AM  [facility] [address] [place], [place], [address] Phone: [phone]   Fax:  [phone]  Name: Jillian [name] MRN: [id number] Date of Birth: 04-03-1941

## 2017-08-05 NOTE — Patient Instructions (Addendum)
Step-Down / Step-Up    Stand on stair step or __4__ inch stool. Slowly bend left leg, lowering other foot to floor. Return by straightening front leg. Repeat 10____ times per set. Do ___1_ sets per session. Do _1___ sessions per day.  http://orth.exer.us/684   Copyright  VHI. All rights reserved.  Hip Hike    Stand on step, right leg off step, knee straight. Raise unsupported hip, keeping knee straight. Repeat _10___ times per set. Do __1__ sets per session. Do ___2_ sessions per day.  http://orth.exer.us/692   Copyright  VHI. All rights reserved.  Knee Flexion: Resisted (Standing)    With support, _0___ pound weight around right ankle, slowly bend knee up. Return slowly.  [id number] ____ times per set. Do __1__ sets per session. Do _2___ sessions per day.  http://orth.exer.us/740   Copyright  VHI. All rights reserved.  Forward Lunge    Standing with feet shoulder width apart and stomach tight, step forward with left leg. Repeat _10___ times per set. Do ___1_ sets per session. Do ___022_ sessions per day.  http://orth.exer.us/1146   Copyright  VHI. All rights reserved.

## 2017-08-12 ENCOUNTER — Encounter (HOSPITAL_COMMUNITY): Payer: Self-pay

## 2017-08-12 ENCOUNTER — Ambulatory Visit (HOSPITAL_COMMUNITY): Payer: Medicare HMO

## 2017-08-12 DIAGNOSIS — G8929 Other chronic pain: Secondary | ICD-10-CM

## 2017-08-12 DIAGNOSIS — M25562 Pain in left knee: Secondary | ICD-10-CM

## 2017-08-12 DIAGNOSIS — R2681 Unsteadiness on feet: Secondary | ICD-10-CM

## 2017-08-12 DIAGNOSIS — M25561 Pain in right knee: Secondary | ICD-10-CM | POA: Diagnosis not present

## 2017-08-12 NOTE — Therapy (Addendum)
[facility] [address] [place], [place], [address] Phone: [phone]   Fax:  [phone]  Physical Therapy Treatment  Addendum 10/05/17: PHYSICAL THERAPY DISCHARGE SUMMARY  Visits from Start of Care: 3  Current functional level related to goals / functional outcomes: See below   Remaining deficits: See below   Education / Equipment: See below Plan: Patient agrees to discharge.  Patient goals were not met. Patient is being discharged due to not returning since the last visit.  ?????     Geraldine Solar PT, DPT   Patient Details  Name: Jillian [name] MRN: [id number] Date of Birth: September 20, 1941 Referring Provider: Sabino Niemann   Encounter Date: 08/12/2017  PT End of Session - 08/12/17 0902    Visit Number  3    Number of Visits  4    Date for PT Re-Evaluation  08/14/17    Authorization Type  [employer]    Authorization - Visit Number  3    Authorization - Number of Visits  4    PT Start Time  0902    Activity Tolerance  Patient tolerated treatment well    Behavior During Therapy  [facility] for tasks assessed/performed       Past Medical History:  Diagnosis Date  . Breast cancer ([place]) 2016   Right  . Chronic fatigue   . Diabetes mellitus 2011  . DJD (degenerative joint disease)    of neck    . Dyslipidemia   . Hypertension 2005  . Mild obesity   . Postmenopausal   . Wears glasses     Past Surgical History:  Procedure Laterality Date  . ABDOMINAL HYSTERECTOMY  1992   fibroids  . APPENDECTOMY    . BREAST LUMPECTOMY WITH RADIOACTIVE SEED LOCALIZATION Right 10/05/2014   Procedure: BREAST LUMPECTOMY WITH RADIOACTIVE SEED LOCALIZATION;  Surgeon: Erroll Luna, MD;  Location: [facility];  Service: General;  Laterality: Right;  . cataract Bilateral   . CYST EXCISION Right    arm  . EYE SURGERY Right 06/07/2013   cataract  . EYE SURGERY Left 09/14/2013   cataract  . KNEE ARTHROSCOPY Right 02/26/2016    Procedure: ARTHROSCOPY RIGHT KNEE WITH MENSICAL DEBRIDEMENT;  Surgeon: Gaynelle Arabian, MD;  [facility];  Service: Orthopedics;  Laterality: Right;  . left breast biopsy    . LESION REMOVAL Right 03/30/2013   Procedure: EXCISION OF NEOPLASM ARM;  Surgeon: Jamesetta So, MD;  Location[facility];  Service: General;  Laterality: Right;  . TUBAL LIGATION      There were no vitals filed for this visit.  Subjective Assessment - 08/12/17 0902    Subjective  Pt reports that she is feeling fine today. Her knees are not hurting. She reports that her knee pain comes and goes. She states they still hurt when she is sitting in something low but once she's moving around they feel fine.    Pertinent History  neck pain, back pain, hip pain, DM , HTN    How long can you sit comfortably?  no problem just going sit to stand is the problem     How long can you stand comfortably?  no problem    How long can you walk comfortably?  no problem     Patient Stated Goals  to be able to go from stand to sit and sit to stand easier, to be able to go up and down steps and inclines better.  Currently in Pain?  No/denies    Pain Onset  More than a month ago          [facility] Adult PT Treatment/Exercise - 08/12/17 0001      Knee/Hip Exercises: Stretches   Active Hamstring Stretch  Both;3 reps;30 seconds    Active Hamstring Stretch Limitations  supine with rope    Quad Stretch  Both;2 reps;30 seconds    Quad Stretch Limitations  prone with rope      Knee/Hip Exercises: Standing   Heel Raises  Both;15 reps    Heel Raises Limitations  heel and toe    Knee Flexion  Both;15 reps    Knee Flexion Limitations  1# ankle weights    SLS with Vectors  x5RT, BLE    Other Standing Knee Exercises  tandem stance 3x15-20" BLE    Other Standing Knee Exercises  sidestepping with RTB 64f x 2RT      Knee/Hip Exercises: Seated   Sit to Sand  10 reps;without UE support 1 set from chair, 2nd set from lowered table       Knee/Hip Exercises: Supine   Bridges  Both;15 reps    Bridges Limitations  2-3" holds      Knee/Hip Exercises: Sidelying   Hip ABduction  Both;15 reps            PT Education - 08/12/17 0905    Education provided  Yes    Education Details  updated HEP; push through heels with sit to stand    Person(s) Educated  Patient    Methods  Explanation;Demonstration;Handout    Comprehension  Verbalized understanding;Returned demonstration       PT Short Term Goals - 08/05/17 1025      PT SHORT TERM GOAL #1   Title  Pt knee pain no greater than a 7/10 to allow pt to come from sit to stand and stand to sit 50% easier.     Time  2    Period  Weeks    Status  On-going      PT SHORT TERM GOAL #2   Title  Pt to be completing her HEP on a regular basis to obtain goal of decreased knee pain with functional tasks.     Time  2    Period  Weeks    Status  On-going        PT Long Term Goals - 08/05/17 1025      PT LONG TERM GOAL #1   Title  PT to be completing advance HEP to allow pt to be able to go up and down steps without increased knee discomfort.    Time  4    Period  Weeks    Status  On-going      PT LONG TERM GOAL #2   Title  PT to be able to get in and out of a couch without increased knee pain     Time  4    Period  Weeks    Status  On-going      PT LONG TERM GOAL #3   Title  Pt single leg balance to be at least 20 seconds to decrease pt risk of falling     Time  4    Period  Weeks    Status  On-going            Plan - 08/12/17 0940    Clinical Impression Statement  Session focused on improving flexibility, hip and functional strength,  as well as balance. Pt cued to push through heels when standing up from lower surface to engage more posterior chain and she reported this improved her c/o pain with this functional task. Educated pt to continue to do this when rising from chairs and her couch at home. No pain at EOS, just fatigue reported. Pt due for  reassessment next visit    Rehab Potential  Good    PT Frequency  1x / week    PT Duration  4 weeks    PT Treatment/Interventions  ADLs/Self Care Home Management;Patient/family education;Therapeutic exercise;Therapeutic activities;Functional mobility training    PT Next Visit Plan  reassess    PT Home Exercise Plan  Eval:  terminal extension, hamstring stretch, heel raise, SLS, minisquat and forward step ups.        Patient will benefit from skilled therapeutic intervention in order to improve the following deficits and impairments:  Pain, Decreased activity tolerance, Decreased balance  Visit Diagnosis: Chronic pain of right knee  Chronic pain of left knee  Unsteadiness on feet     Problem List Patient Active Problem List   Diagnosis Date Noted  . Chronic left SI joint pain 05/21/2016  . Primary osteoarthritis of both knees 06/26/2015  . 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 Thoracic spine pain 09/28/2014  . Hip pain 08/06/2012  . 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 Hot flashes, menopausal 09/10/2011  . 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 INSOMNIA, CHRONIC 11/28/2009  . Hyperlipidemia with target LDL less than 100 10/12/2007  . Obesity (BMI 30.0-34.9) 10/12/2007  . ESSENTIAL HYPERTENSION, BENIGN 10/12/2007      Geraldine Solar PT, DPT  [facility] [address] [place], [place], [address] Phone: [phone]   Fax:  [phone]  Name: Jillian [name] MRN: [id number] Date of Birth: [birth year]

## 2017-08-12 NOTE — Patient Instructions (Addendum)
  HIP ABDUCTION - SIDELYING  While lying on your side, slowly raise up your top leg to the side. Keep your knee straight and maintain your toes pointed forward the entire time. Keep your leg in-line with your body.  The bottom leg can be bent to stabilize your body.   BRIDGING  While lying on your back with knees bent, tighten your lower abdominals, squeeze your buttocks and then raise your buttocks off the floor/bed as creating a "Bridge" with your body. Hold and then lower yourself and repeat.   SIT TO STAND - NO SUPPORT  Start by scooting close to the front of the chair.  Next, lean forward at your trunk and reach forward with your arms and rise to standing without using your hands to push off from the chair or other object.   Use your arms as a counter-balance by reaching forward when in sitting and lower them as you approach standing.   **Push through your heel**   lateral walk at pole or counter   Standing at a support surface (role or counter top) use hands to assist with balance. Perform sideways walking down and back until you feel that you need to take break.   It may be help to place a chair close to allow for safe rest breaks.     TANDEM STANCE BALANCE  Stand and balnace in tandem stance. Hold this position. Relax and repeat.  Try and hold for 20-30 seconds each way   Add in with other exercises, performing 2-3 sets of 10-15 reps each

## 2017-08-15 DIAGNOSIS — J40 Bronchitis, not specified as acute or chronic: Secondary | ICD-10-CM | POA: Diagnosis not present

## 2017-08-17 ENCOUNTER — Telehealth: Payer: Self-pay | Admitting: *Deleted

## 2017-08-17 ENCOUNTER — Telehealth: Payer: Self-pay | Admitting: Family Medicine

## 2017-08-17 NOTE — Telephone Encounter (Signed)
Patient came by said she went to [facility] and was advised to take mucinex. She says she feels better. She also dropped off paperwork to be signed and mailed back. I placed it in the box for Dr.Simpson

## 2017-08-17 NOTE — Telephone Encounter (Signed)
Noted  

## 2017-08-17 NOTE — Telephone Encounter (Signed)
Patient called and left a message stating she has a head cold and would like to be seen. I returned call to patient today and no answer I left her a message.

## 2017-08-18 ENCOUNTER — Telehealth (HOSPITAL_COMMUNITY): Payer: Self-pay | Admitting: Family Medicine

## 2017-08-18 NOTE — Telephone Encounter (Signed)
11/27pt left a message to cx said that she has bronchitis/18

## 2017-08-19 ENCOUNTER — Ambulatory Visit (HOSPITAL_COMMUNITY): Payer: Medicare HMO | Admitting: Physical Therapy

## 2017-08-19 NOTE — Telephone Encounter (Signed)
States she has been taking mucinex and feeling better now

## 2017-08-31 ENCOUNTER — Encounter: Payer: Medicare HMO | Admitting: Family Medicine

## 2017-09-09 ENCOUNTER — Encounter: Payer: Self-pay | Admitting: Family Medicine

## 2017-09-09 ENCOUNTER — Ambulatory Visit (INDEPENDENT_AMBULATORY_CARE_PROVIDER_SITE_OTHER): Payer: Medicare HMO | Admitting: Family Medicine

## 2017-09-09 VITALS — BP 138/80 | HR 89 | Resp 16 | Ht 64.0 in | Wt 185.0 lb

## 2017-09-09 DIAGNOSIS — E1121 Type 2 diabetes mellitus with diabetic nephropathy: Secondary | ICD-10-CM

## 2017-09-09 DIAGNOSIS — E785 Hyperlipidemia, unspecified: Secondary | ICD-10-CM

## 2017-09-09 DIAGNOSIS — M7552 Bursitis of left shoulder: Secondary | ICD-10-CM | POA: Diagnosis not present

## 2017-09-09 DIAGNOSIS — Z0000 Encounter for general adult medical examination without abnormal findings: Secondary | ICD-10-CM

## 2017-09-09 DIAGNOSIS — M170 Bilateral primary osteoarthritis of knee: Secondary | ICD-10-CM

## 2017-09-09 DIAGNOSIS — G4700 Insomnia, unspecified: Secondary | ICD-10-CM

## 2017-09-09 DIAGNOSIS — I10 Essential (primary) hypertension: Secondary | ICD-10-CM

## 2017-09-09 MED ORDER — TEMAZEPAM 15 MG PO CAPS: 15.0000 mg | ORAL_CAPSULE | Freq: Every evening | ORAL | 3 refills | Status: DC | PRN

## 2017-09-09 MED ORDER — METFORMIN HCL ER 500 MG PO TB24: 500.0000 mg | ORAL_TABLET | Freq: Two times a day (BID) | ORAL | 1 refills | Status: DC

## 2017-09-09 MED ORDER — PREDNISONE 5 MG (21) PO TBPK: 5.0000 mg | ORAL_TABLET | ORAL | 0 refills | Status: DC

## 2017-09-09 MED ORDER — METHYLPREDNISOLONE ACETATE 80 MG/ML IJ SUSP
80.0000 mg | Freq: Once | INTRAMUSCULAR | Status: AC
  Administered 2017-09-09: 80 mg via INTRAMUSCULAR

## 2017-09-09 MED ORDER — VENLAFAXINE HCL ER 37.5 MG PO CP24: 37.5000 mg | ORAL_CAPSULE | Freq: Every day | ORAL | 5 refills | Status: DC

## 2017-09-09 MED ORDER — IBUPROFEN 800 MG PO TABS: 800.0000 mg | ORAL_TABLET | Freq: Three times a day (TID) | ORAL | 0 refills | Status: DC | PRN

## 2017-09-09 MED ORDER — KETOROLAC TROMETHAMINE 60 MG/2ML IM SOLN
60.0000 mg | Freq: Once | INTRAMUSCULAR | Status: AC
  Administered 2017-09-09: 60 mg via INTRAMUSCULAR

## 2017-09-09 NOTE — Assessment & Plan Note (Signed)
Updated lab needed at/ before next visit. Jillian [name] is reminded of the importance of commitment to daily physical activity for 30 minutes or more, as able and the need to limit carbohydrate intake to 30 to 60 grams per meal to help with blood sugar control.   The need to take medication as prescribed, test blood sugar as directed, and to call between visits if there is a concern that blood sugar is uncontrolled is also discussed.   Jillian [name] is reminded of the importance of daily foot exam, annual eye examination, and good blood sugar, blood pressure and cholesterol control.  Diabetic Labs Latest Ref Rng & Units 05/20/2017 05/12/2017 12/31/2016 07/25/2016 04/02/2016  HbA1c <5.7 % - - 6.2(H) 6.4(H) -  Microalbumin Not Estab. ug/mL 3.6(H) - - - 12.7(H)  Micro/Creat Ratio 0.0 - 30.0 mg/g creat 1.7 - - - -  Chol <200 mg/dL - - 136 144 -  HDL >50 mg/dL - - 55 51 -  Calc LDL <100 mg/dL - - 62 70 -  Triglycerides <150 mg/dL - - 96 115 -  Creatinine 0.44 - 1.00 mg/dL - 0.96 0.99(H) 0.98(H) -   BP/Weight 09/09/2017 05/19/2017 05/12/2017 02/18/2017 02/10/2017 01/07/2017 0/17/7939  Systolic BP 030 092 330 076 226 333 545  Diastolic BP 80 84 75 80 71 72 82  Wt. (Lbs) 185 191.8 191.3 192 190 194 194.08  BMI 31.76 32.92 33.36 32.96 32.61 33.3 33.31   Foot/eye exam completion dates Latest Ref Rng & Units 09/09/2017 07/28/2016  Eye Exam No Retinopathy - -  Foot exam Order - - -  Foot Form Completion - Done Done

## 2017-09-09 NOTE — Assessment & Plan Note (Signed)

## 2017-09-09 NOTE — Patient Instructions (Addendum)
Wellness with nurse April 20 or after, call if you need me sooner  You are referred for diabetic eye exam  Two injections in office today for bursitis of left shoulder and 2 medications are prescribed also  New medication to help with sleep, continue to practice good sleep hygiene also  Please call if left shoulder pain and swelling persists , I will refer you to orthopedics  Labs todayHBA1C, chem 7 and eGFr and tSH  It is important that you exercise regularly at least 30 minutes 5 times a week. If you develop chest pain, have severe difficulty breathing, or feel very tired, stop exercising immediately and seek medical attention  Please work on good  health habits so that your health will improve. 1. Commitment to daily physical activity for 30 to 60  minutes, if you are able to do this.  2. Commitment to wise food choices. Aim for half of your  food intake to be vegetable and fruit, one quarter starchy foods, and one quarter protein. Try to eat on a regular schedule  3 meals per day, snacking between meals should be limited to vegetables or fruits or small portions of nuts. 64 ounces of water per day is generally recommended, unless you have specific health conditions, like heart failure or kidney failure where you will need to limit fluid intake.  3. Commitment to sufficient and a  good quality of physical and mental rest daily, generally between 6 to 8 hours per day.  WITH PERSISTANCE AND PERSEVERANCE, THE IMPOSSIBLE , BECOMES THE NORM! Thank you  for choosing [facility]. We consider it a privelige to serve you.  Delivering excellent health care in a caring and  compassionate way is our goal.  Partnering with you,  so that together we can achieve this goal is our strategy.

## 2017-09-09 NOTE — Assessment & Plan Note (Signed)
Sleep hygiene reviewed and written information offered also. Prescription sent for  medication needed.  

## 2017-09-09 NOTE — Assessment & Plan Note (Signed)
1 month history. Uncontrolled.Toradol and depo medrol administered IM in the office , to be followed by a short course of oral prednisone and NSAIDS.  

## 2017-09-10 LAB — HEMOGLOBIN A1C
EAG (MMOL/L): 7.4 (calc)
Hgb A1c MFr Bld: 6.3 % of total Hgb — ABNORMAL HIGH (ref <5.7)
Mean Plasma Glucose: 134 (calc)

## 2017-09-10 LAB — COMPLETE METABOLIC PANEL WITH GFR
AG RATIO: 1.5 (calc) (ref 1.0–2.5)
ALBUMIN MSPROF: 3.9 g/dL (ref 3.6–5.1)
ALT: 8 U/L (ref 6–29)
AST: 13 U/L (ref 10–35)
Alkaline phosphatase (APISO): 59 U/L (ref 33–130)
BUN/Creatinine Ratio: 12 (calc) (ref 6–22)
BUN: 12 mg/dL (ref 7–25)
CALCIUM: 9.2 mg/dL (ref 8.6–10.4)
CO2: 27 mmol/L (ref 20–32)
CREATININE: 0.99 mg/dL — AB (ref 0.60–0.93)
Chloride: 106 mmol/L (ref 98–110)
GFR, EST AFRICAN AMERICAN: 65 mL/min/{1.73_m2} (ref >=60)
GFR, EST NON AFRICAN AMERICAN: 56 mL/min/{1.73_m2} — AB (ref >=60)
GLOBULIN: 2.6 g/dL (ref 1.9–3.7)
Glucose, Bld: 113 mg/dL (ref 65–139)
POTASSIUM: 3.9 mmol/L (ref 3.5–5.3)
SODIUM: 141 mmol/L (ref 135–146)
TOTAL PROTEIN: 6.5 g/dL (ref 6.1–8.1)
Total Bilirubin: 0.4 mg/dL (ref 0.2–1.2)

## 2017-09-10 LAB — TSH: TSH: 0.79 mIU/L (ref 0.40–4.50)

## 2017-09-11 NOTE — Progress Notes (Signed)
Jillian [name]     MRN: [id number]      DOB: 03/16/1941  HPI: Patient is in for annual physical exam. C/o left shoulder pain and swelling, no trauma , for the past several months Recent labs, if available are reviewed. Immunization is reviewed , and  updated if needed.   PE: BP 138/80   Pulse 89   Resp 16   Ht 5\' 4"  (1.626 m)   Wt 185 lb (83.9 kg)   SpO2 98%   BMI 31.76 kg/m   Pleasant  female, alert and oriented x 3, in no cardio-pulmonary distress. Afebrile. HEENT No facial trauma or asymetry. Sinuses non tender.  Extra occullar muscles intact. External ears normal, tympanic membranes clear. Oropharynx moist, no exudate. Neck: supple, no adenopathy,JVD or thyromegaly.No bruits.  Chest: Clear to ascultation bilaterally.No crackles or wheezes. Non tender to palpation  Breast: No asymetry,no masses or lumps. No tenderness. No nipple discharge or inversion. No axillary or supraclavicular adenopathy  Cardiovascular system; Heart sounds normal,  S1 and  S2 ,no S3.  No murmur, or thrill. Apical beat not displaced Peripheral pulses normal.  Abdomen: Soft, non tender, no organomegaly or masses. No bruits. Bowel sounds normal. No guarding, tenderness or rebound.  Rectal:  Normal sphincter tone. No rectal mass. Guaiac negative stool.  GU: Not examined  Musculoskeletal exam: Full ROM of spine, hips ,  and knees.markedly reduced in left shoulder which is swollen and tender   No muscle wasting or atrophy.   Neurologic: Cranial nerves 2 to 12 intact. Power, tone ,sensation and reflexes normal throughout. No disturbance in gait. No tremor.  Skin: Intact, no ulceration, erythema , scaling or rash noted. Pigmentation normal throughout  Psych; Normal mood and affect. Judgement and concentration normal   Assessment & Plan:  Annual physical exam Annual exam as documented. Counseling done  re healthy lifestyle involving commitment to 150 minutes exercise  per week, heart healthy diet, and attaining healthy weight.The importance of adequate sleep also discussed. Regular seat belt use and home safety, is also discussed. Changes in health habits are decided on by the patient with goals and time frames  set for achieving them. Immunization and cancer screening needs are specifically addressed at this visit.   Bursitis of deltoid, left 1 month history Uncontrolled.Toradol and depo medrol administered IM in the office , to be followed by a short course of oral prednisone and NSAIDS.   INSOMNIA, CHRONIC Sleep hygiene reviewed and written information offered also. Prescription sent for  medication needed.   Controlled diabetes mellitus with nephropathy Updated lab needed at/ before next visit. [name] is reminded of the importance of commitment to daily physical activity for 30 minutes or more, as able and the need to limit carbohydrate intake to 30 to 60 grams per meal to help with blood sugar control.   The need to take medication as prescribed, test blood sugar as directed, and to call between visits if there is a concern that blood sugar is uncontrolled is also discussed.   [name] is reminded of the importance of daily foot exam, annual eye examination, and good blood sugar, blood pressure and cholesterol control.  Diabetic Labs Latest Ref Rng & Units 05/20/2017 05/12/2017 12/31/2016 07/25/2016 04/02/2016  HbA1c <5.7 % - - 6.2(H) 6.4(H) -  Microalbumin Not Estab. ug/mL 3.6(H) - - - 12.7(H)  Micro/Creat Ratio 0.0 - 30.0 mg/g creat 1.7 - - - -  Chol <200 mg/dL - - 136 144 -  HDL >50 mg/dL - - 55 51 -  Calc LDL <100 mg/dL - - 62 70 -  Triglycerides <150 mg/dL - - 96 115 -  Creatinine 0.44 - 1.00 mg/dL - 0.96 0.99(H) 0.98(H) -   BP/Weight 09/09/2017 05/19/2017 05/12/2017 02/18/2017 02/10/2017 01/07/2017 9/31/1216  Systolic BP 244 695 072 257 505 183 358  Diastolic BP 80 84 75 80 71 72 82  Wt. (Lbs) 185 191.8 191.3 192 190 194 194.08    BMI 31.76 32.92 33.36 32.96 32.61 33.3 33.31   Foot/eye exam completion dates Latest Ref Rng & Units 09/09/2017 07/28/2016  Eye Exam No Retinopathy - -  Foot exam Order - - -  Foot Form Completion - Done Done

## 2017-09-28 ENCOUNTER — Other Ambulatory Visit: Payer: Self-pay | Admitting: Family Medicine

## 2017-09-28 ENCOUNTER — Telehealth: Payer: Self-pay | Admitting: Family Medicine

## 2017-09-28 DIAGNOSIS — M13812 Other specified arthritis, left shoulder: Secondary | ICD-10-CM

## 2017-09-28 NOTE — Progress Notes (Signed)
amb ortho for chronic left shoulder pain , worsening

## 2017-09-28 NOTE — Telephone Encounter (Signed)
Nothing has changed in her Left Arm, please go ahead with the referral. Would prefer to stay in [place].

## 2017-09-28 NOTE — Telephone Encounter (Signed)
I spoke with the patient , sjhe is being referred to Dr Aline Brochure re left shoulder pain with reduced rOM and is aware

## 2017-10-13 ENCOUNTER — Telehealth: Payer: Self-pay | Admitting: Family Medicine

## 2017-10-13 ENCOUNTER — Other Ambulatory Visit: Payer: Self-pay

## 2017-10-13 MED ORDER — CLONIDINE HCL 0.1 MG PO TABS: 0.1000 mg | ORAL_TABLET | Freq: Every day | ORAL | 1 refills | Status: DC

## 2017-10-13 MED ORDER — BENAZEPRIL HCL 10 MG PO TABS: 10.0000 mg | ORAL_TABLET | Freq: Every day | ORAL | 1 refills | Status: DC

## 2017-10-13 MED ORDER — CLONIDINE HCL 0.1 MG PO TABS: 0.1000 mg | ORAL_TABLET | Freq: Every day | ORAL | 0 refills | Status: DC

## 2017-10-13 MED ORDER — BENAZEPRIL HCL 10 MG PO TABS: 10.0000 mg | ORAL_TABLET | Freq: Every day | ORAL | 0 refills | Status: DC

## 2017-10-13 NOTE — Telephone Encounter (Signed)
Med sent.

## 2017-10-13 NOTE — Telephone Encounter (Signed)
Patient is requesting refill for BP medication,. She ran out last night. She is requesting a few pills to be sent to [facility] until she gets her prescription in the mail. Original pharmacy is  [facility]. Cb#: [phone]   She also left an FL2 form to be filled out, I placed it in Dr.Simpsons mail box .

## 2017-10-27 ENCOUNTER — Telehealth: Payer: Self-pay | Admitting: Family Medicine

## 2017-10-27 DIAGNOSIS — M25512 Pain in left shoulder: Secondary | ICD-10-CM | POA: Diagnosis not present

## 2017-10-27 DIAGNOSIS — H524 Presbyopia: Secondary | ICD-10-CM | POA: Diagnosis not present

## 2017-10-27 DIAGNOSIS — G8929 Other chronic pain: Secondary | ICD-10-CM | POA: Diagnosis not present

## 2017-10-27 NOTE — Telephone Encounter (Signed)
Pt called in and wants her Ortho Referral to be sent to D[facility] Plover. Aluisio, MD Address: [address], [place], [place] [address]  Phone: [phone]  Appointments: [email or web address]  Pt advised that she also went to the Urgent Care as her Shoulder was hurting so bad.

## 2017-11-09 DIAGNOSIS — M7542 Impingement syndrome of left shoulder: Secondary | ICD-10-CM | POA: Insufficient documentation

## 2017-11-11 ENCOUNTER — Other Ambulatory Visit (HOSPITAL_COMMUNITY): Payer: Self-pay | Admitting: *Deleted

## 2017-11-11 DIAGNOSIS — D241 Benign neoplasm of right breast: Secondary | ICD-10-CM

## 2017-11-13 ENCOUNTER — Encounter (HOSPITAL_COMMUNITY): Payer: Self-pay | Admitting: Oncology

## 2017-11-13 ENCOUNTER — Inpatient Hospital Stay (HOSPITAL_COMMUNITY): Payer: Medicare HMO | Attending: Oncology

## 2017-11-13 ENCOUNTER — Inpatient Hospital Stay (HOSPITAL_BASED_OUTPATIENT_CLINIC_OR_DEPARTMENT_OTHER): Payer: Medicare HMO | Admitting: Oncology

## 2017-11-13 ENCOUNTER — Other Ambulatory Visit: Payer: Self-pay

## 2017-11-13 VITALS — BP 114/74 | HR 66 | Temp 98.7°F | Resp 16 | Ht 64.0 in | Wt 185.0 lb

## 2017-11-13 DIAGNOSIS — D241 Benign neoplasm of right breast: Secondary | ICD-10-CM

## 2017-11-13 DIAGNOSIS — Z79810 Long term (current) use of selective estrogen receptor modulators (SERMs): Secondary | ICD-10-CM

## 2017-11-13 DIAGNOSIS — D649 Anemia, unspecified: Secondary | ICD-10-CM

## 2017-11-13 LAB — CBC WITH DIFFERENTIAL/PLATELET
BASOS PCT: 0 %
Basophils Absolute: 0 10*3/uL (ref 0.0–0.1)
EOS ABS: 0 10*3/uL (ref 0.0–0.7)
Eosinophils Relative: 0 %
HCT: 38.5 % (ref 36.0–46.0)
Hemoglobin: 11.7 g/dL — ABNORMAL LOW (ref 12.0–15.0)
Lymphocytes Relative: 26 %
Lymphs Abs: 2.4 10*3/uL (ref 0.7–4.0)
MCH: 29.4 pg (ref 26.0–34.0)
MCHC: 30.4 g/dL (ref 30.0–36.0)
MCV: 96.7 fL (ref 78.0–100.0)
MONOS PCT: 6 %
Monocytes Absolute: 0.5 10*3/uL (ref 0.1–1.0)
Neutro Abs: 6.3 10*3/uL (ref 1.7–7.7)
Neutrophils Relative %: 68 %
Platelets: 161 10*3/uL (ref 150–400)
RBC: 3.98 MIL/uL (ref 3.87–5.11)
RDW: 14 % (ref 11.5–15.5)
WBC: 9.3 10*3/uL (ref 4.0–10.5)

## 2017-11-13 LAB — COMPREHENSIVE METABOLIC PANEL
ALBUMIN: 3.5 g/dL (ref 3.5–5.0)
ALT: 12 U/L — ABNORMAL LOW (ref 14–54)
ANION GAP: 10 (ref 5–15)
AST: 14 U/L — ABNORMAL LOW (ref 15–41)
Alkaline Phosphatase: 55 U/L (ref 38–126)
BUN: 14 mg/dL (ref 6–20)
CHLORIDE: 107 mmol/L (ref 101–111)
CO2: 23 mmol/L (ref 22–32)
Calcium: 8.8 mg/dL — ABNORMAL LOW (ref 8.9–10.3)
Creatinine, Ser: 0.92 mg/dL (ref 0.44–1.00)
GFR calc Af Amer: >60 mL/min (ref >60)
GFR calc non Af Amer: 59 mL/min — ABNORMAL LOW (ref >60)
GLUCOSE: 128 mg/dL — AB (ref 65–99)
POTASSIUM: 3.4 mmol/L — AB (ref 3.5–5.1)
SODIUM: 140 mmol/L (ref 135–145)
TOTAL PROTEIN: 6.7 g/dL (ref 6.5–8.1)
Total Bilirubin: 0.1 mg/dL — ABNORMAL LOW (ref 0.3–1.2)

## 2017-11-13 LAB — IRON AND TIBC
IRON: 115 ug/dL (ref 28–170)
Saturation Ratios: 37 % — ABNORMAL HIGH (ref 10.4–31.8)
TIBC: 309 ug/dL (ref 250–450)
UIBC: 194 ug/dL

## 2017-11-13 LAB — FERRITIN: Ferritin: 89 ng/mL (ref 11–307)

## 2017-11-13 NOTE — Progress Notes (Signed)
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 Underwent a right breast lumpectomy Dr. Brantley Stage on 10/05/2014. Has been undergoing diagnostic mammograms for ongoing follow-up of a 1.2 x 1.4 x 0.9 cm hyperechoic lobular mass in right breast at the 2:30 position 6 cm from the nipple.  She has been tolerating tamoxifen well. She does get occasional night sweats but they are manageable. Most recent bilateral diagnostic mammogram was on 11/18/2016 which was BI-RADS 2. She had to cancel her most recent mammogram due to a left shoulder injury. She saw her orthopedic physician this week and was given a cortisone injection which has helped tremendously per patient. She is now ready to set up her mammogram for this year. She conducts daily self breast exams at home and has not identified any masses. She denies headaches, chest pain, shortness of breath, abdominal or any other concerns today.   MEDICAL HISTORY:  Past Medical History:  Diagnosis Date  . Breast cancer ([place]) 2016   Right  . Chronic fatigue   . Diabetes mellitus 2011  . DJD (degenerative joint disease)    of neck    . Dyslipidemia   . Hypertension 2005  . Mild obesity   . Postmenopausal   . Wears glasses     SURGICAL HISTORY: Past Surgical History:  Procedure Laterality Date  . ABDOMINAL HYSTERECTOMY  1992   fibroids  . APPENDECTOMY    . BREAST LUMPECTOMY WITH RADIOACTIVE SEED LOCALIZATION Right 10/05/2014   Procedure:  BREAST LUMPECTOMY WITH RADIOACTIVE SEED LOCALIZATION;  Surgeon: Erroll Luna, MD;  Location: [facility];  Service: General;  Laterality: Right;  . cataract Bilateral   . CYST EXCISION Right    arm  . EYE SURGERY Right 06/07/2013   cataract  . EYE SURGERY Left 09/14/2013   cataract  . KNEE ARTHROSCOPY Right 02/26/2016   Procedure: ARTHROSCOPY RIGHT KNEE WITH MENSICAL DEBRIDEMENT;  Surgeon: Gaynelle Arabian, MD;  Location: [facility];  Service: Orthopedics;  Laterality: Right;  . left breast biopsy    . LESION REMOVAL Right 03/30/2013   Procedure: EXCISION OF NEOPLASM ARM;  Surgeon: Jamesetta So, MD;  Location: [facility];  Service: General;  Laterality: Right;  . TUBAL LIGATION      SOCIAL HISTORY: Social History   Socioeconomic History  . Marital status: Married    Spouse name: Not on file  . Number of children: Not on file  . Years of education: Not on file  . Highest education level: Not on file  Social Needs  . Financial resource strain: Not on file  . Food insecurity - worry: Not on file  . Food insecurity - inability: Not on file  . Transportation needs - medical: Not on file  . Transportation needs - non-medical: Not on file  Occupational History  . Not on file  Tobacco Use  . Smoking status: Never Smoker  . Smokeless tobacco: Never Used  Substance and Sexual Activity  . Alcohol use: No  . Drug use: No  . Sexual activity: Not Currently    Birth control/protection: Surgical  Other  Topics Concern  . Not on file  Social History Narrative  . Not on file  The patient states she is "partially married"  Or "separated" she has 2 sons, 3 grandsons and 1 great grandson. She is a never smoker and does not drink ETOH. She has worked in [employer] and in [employer].  FAMILY HISTORY: Family History  Problem Relation Age of Onset  . Hypertension Father   . Congestive Heart Failure Father   . Liver cancer Mother   . Hypertension Sister   . Hypertension Brother    . Diabetes Brother    indicated that her mother is deceased. She indicated that her father is deceased. She indicated that her sister is alive. She indicated that all of her three brothers are alive. She indicated that both of her sons are alive.  Mother died at 29 from liver cancer, Father died at 108 from CHF.  3 Brothers and 1 sister, 2 brothers have prostate cancer.   ALLERGIES:  is allergic to codeine and propoxyphene n-acetaminophen.  MEDICATIONS:  Current Outpatient Medications  Medication Sig Dispense Refill  . acetaminophen (TYLENOL) 500 MG tablet Take 1,000 mg by mouth every 6 (six) hours as needed for mild pain.    Marland Kitchen aspirin EC 81 MG tablet Take 1 tablet (81 mg total) by mouth every evening. (Patient taking differently: Take 81 mg by mouth every morning. ) 90 tablet 1  . benazepril (LOTENSIN) 10 MG tablet Take 1 tablet (10 mg total) by mouth daily. 90 tablet 1  . cloNIDine (CATAPRES) 0.1 MG tablet Take 1 tablet (0.1 mg total) by mouth at bedtime. 90 tablet 1  . ibuprofen (ADVIL,MOTRIN) 800 MG tablet Take 1 tablet (800 mg total) by mouth every 8 (eight) hours as needed. 15 tablet 0  . metFORMIN (GLUCOPHAGE-XR) 500 MG 24 hr tablet Take 1 tablet (500 mg total) by mouth 2 (two) times daily. 180 tablet 1  . methocarbamol (ROBAXIN) 500 MG tablet Take 1 tablet (500 mg total) by mouth 2 (two) times daily. 14 tablet 0  . tamoxifen (NOLVADEX) 20 MG tablet TAKE 1 TABLET EVERY DAY (Patient taking differently: TAKE 1 TABLET DAILY AT BEDTIME) 90 tablet 4  . temazepam (RESTORIL) 15 MG capsule Take 1 capsule (15 mg total) by mouth at bedtime as needed for sleep. 30 capsule 3  . venlafaxine XR (EFFEXOR-XR) 37.5 MG 24 hr capsule Take 1 capsule (37.5 mg total) by mouth daily with breakfast. 30 capsule 5   No current facility-administered medications for this visit.     Review of Systems  Constitutional: Negative.  Negative for chills, fever, malaise/fatigue and weight loss.  HENT: Negative for  congestion and ear pain.   Eyes: Negative.  Negative for blurred vision and double vision.  Respiratory: Negative.  Negative for cough, sputum production and shortness of breath.   Cardiovascular: Negative.  Negative for chest pain, palpitations and leg swelling.  Gastrointestinal: Negative.  Negative for abdominal pain, constipation, diarrhea, nausea and vomiting.  Genitourinary: Negative for dysuria, frequency and urgency.  Musculoskeletal: Positive for joint pain (Left shoulder- recent cortisone injection). Negative for back pain and falls.  Skin: Negative.  Negative for rash.  Neurological: Negative.  Negative for weakness and headaches.  Endo/Heme/Allergies: Negative.  Does not bruise/bleed easily.  Psychiatric/Behavioral: Negative.  Negative for depression. The patient is not nervous/anxious and does not have insomnia.   14 point review of systems was performed and is negative except as detailed under history of present illness and  above  PHYSICAL EXAMINATION: ECOG PERFORMANCE STATUS: 0 - Asymptomatic  Vitals:   11/13/17 1106  BP: 114/74  Pulse: 66  Resp: 16  Temp: 98.7 F (37.1 C)  SpO2: 100%   Filed Weights   11/13/17 1106  Weight: 185 lb (83.9 kg)   Physical Exam  Constitutional: She is oriented to person, place, and time and well-developed, well-nourished, and in no distress. Vital signs are normal.  HENT:  Head: Normocephalic and atraumatic.  Eyes: Pupils are equal, round, and reactive to light.  Neck: Normal range of motion.  Cardiovascular: Normal rate, regular rhythm and normal heart sounds.  No murmur heard. Pulmonary/Chest: Effort normal and breath sounds normal. She has no wheezes. She exhibits no mass, no tenderness, no bony tenderness and no edema. Right breast exhibits no mass, no nipple discharge, no skin change and no tenderness. Left breast exhibits no mass, no nipple discharge, no skin change and no tenderness.    Abdominal: Soft. Normal appearance and  bowel sounds are normal. She exhibits no distension. There is no tenderness.  Musculoskeletal: Normal range of motion. She exhibits no edema.  Neurological: She is alert and oriented to person, place, and time. Gait normal.  Skin: Skin is warm and dry. No rash noted.  Psychiatric: Mood, memory, affect and judgment normal.   LABORATORY DATA:  I have reviewed the data as listed Lab Results  Component Value Date   WBC 9.3 11/13/2017   HGB 11.7 (L) 11/13/2017   HCT 38.5 11/13/2017   MCV 96.7 11/13/2017   PLT 161 11/13/2017     Chemistry      Component Value Date/Time   NA 140 11/13/2017 0919   K 3.4 (L) 11/13/2017 0919   CL 107 11/13/2017 0919   CO2 23 11/13/2017 0919   BUN 14 11/13/2017 0919   CREATININE 0.92 11/13/2017 0919   CREATININE 0.99 (H) 09/09/2017 1020      Component Value Date/Time   CALCIUM 8.8 (L) 11/13/2017 0919   ALKPHOS 55 11/13/2017 0919   AST 14 (L) 11/13/2017 0919   ALT 12 (L) 11/13/2017 0919   BILITOT 0.1 (L) 11/13/2017 0919      RADIOGRAPHIC STUDIES: I have personally reviewed the radiological images as listed in HPI and agreed with the findings in the report. MM DIAG BREAST TOMO BILATERAL (Accession [id number]) (Order [id number])  Imaging  Date: 11/18/2016 Department: [facility] Released By: Georgianne Fick Authorizing: Fayrene Helper, MD  Exam Information   Status Exam Begun  Exam Ended   Final [99] 11/18/2016 2:06 PM 11/18/2016 2:22 PM  Study Result   CLINICAL DATA:  Annual examination. The patient had a right breast lumpectomy in January 2016 showing atypical ductal hyperplasia and a papilloma. See currently takes tamoxifen for her history of high risk breast lesions. She is asymptomatic.  EXAM: 2D DIGITAL DIAGNOSTIC BILATERAL MAMMOGRAM WITH CAD AND ADJUNCT TOMO  COMPARISON:  Previous exam(s).  ACR Breast Density Category b: There are scattered areas of fibroglandular density.  FINDINGS: There stable postsurgical  changes in the lower inner quadrant of the right breast. Scattered bilateral breast nodules appear stable. No new or suspicious mass, nonsurgical distortion, or suspicious microcalcification is identified in either breast to suggest malignancy.  Mammographic images were processed with CAD.  IMPRESSION: No evidence of malignancy in either breast. Postsurgical changes in the lower inner quadrant of the right breast.  RECOMMENDATION: Diagnostic mammogram is suggested in 1 year. (Code:[id number]-B-01Y)  I have discussed the findings and recommendations with  the patient. Results were also provided in writing at the conclusion of the visit. If applicable, a reminder letter will be sent to the patient regarding the next appointment.  BI-RADS CATEGORY  2: Benign.      CT Abdomen Pelvis w/ Contrast 03/31/2016 IMPRESSION: 1. Mild acute diverticulitis of the sigmoid colon. 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 Brantley Stage on 10/05/2014 High Risk Breast Disease Tamoxifen for chemoprevention Hot flashes not improved with Effexor or Gabapentin  Clinically no evidence of disease on exam today.  Repeat bilateral diagnostic mammogram ASAP. Patient would prefer morning appointment.   Continue tamoxifen.  Anemia: Hemoglobin 11.7 today. Patient denies melena, hematochezia, hematuria or any other type of bleeding.. Patient had colonoscopy on 12/2011 which showed internal hemorrhoids, diverticulosis, and NSAID induced gastritis and duodenal polyp. Will check iron panel and ferritin today. Patient denies taking NSAIDS at this time. Hemoglobin continues to trend down may warrant further workup by GI. Will call patient with results.   RTC in 6 months for CBC and CMP.  Orders Placed This Encounter  Procedures  . MM DIAG BREAST TOMO BILATERAL     Standing Status:   Future    Standing Expiration Date:   11/13/2018    Order Specific Question:   Reason for Exam (SYMPTOM  OR DIAGNOSIS REQUIRED)    Answer:   HX of breast cancer    Order Specific Question:   Preferred imaging location?    Answer:   [facility]  . Ferritin    Standing Status:   Future    Number of Occurrences:   1    Standing Expiration Date:   11/13/2018  . Iron and TIBC    Standing Status:   Future    Number of Occurrences:   1    Standing Expiration Date:   11/13/2018    Greater than 50% was spent in counseling and coordination of care with this patient including but not limited to discussion of the relevant topics above (See A&P) including, but not limited to diagnosis and management of acute and chronic medical conditions.   All questions were answered. The patient knows to call the clinic with any problems, questions or concerns.   Faythe Casa, NP 11/13/2017 11:53 AM

## 2017-11-19 ENCOUNTER — Other Ambulatory Visit (HOSPITAL_COMMUNITY): Payer: Self-pay

## 2017-11-19 DIAGNOSIS — D649 Anemia, unspecified: Secondary | ICD-10-CM

## 2017-11-19 NOTE — Progress Notes (Signed)
Labs entered for 3 months.

## 2017-11-23 ENCOUNTER — Telehealth: Payer: Self-pay | Admitting: Family Medicine

## 2017-11-23 DIAGNOSIS — M79675 Pain in left toe(s): Secondary | ICD-10-CM

## 2017-11-23 NOTE — Telephone Encounter (Signed)
pls refer, specify the toe, I will sign

## 2017-11-23 NOTE — Telephone Encounter (Signed)
Pt is calling as she broke her toe and needs a new referral for her toe. Same providers please. Dr Lazarus Gowda office

## 2017-11-24 ENCOUNTER — Encounter (HOSPITAL_COMMUNITY): Payer: Medicare HMO

## 2017-11-25 NOTE — Telephone Encounter (Signed)
Patient aware referral entered

## 2017-11-25 NOTE — Addendum Note (Signed)
Addended by: Eual Fines on: 11/25/2017 03:47 PM   Modules accepted: Orders

## 2017-11-27 DIAGNOSIS — S92912A Unspecified fracture of left toe(s), initial encounter for closed fracture: Secondary | ICD-10-CM | POA: Diagnosis not present

## 2017-11-27 DIAGNOSIS — S92812A Other fracture of left foot, initial encounter for closed fracture: Secondary | ICD-10-CM | POA: Diagnosis not present

## 2017-11-28 DIAGNOSIS — S92919A Unspecified fracture of unspecified toe(s), initial encounter for closed fracture: Secondary | ICD-10-CM | POA: Insufficient documentation

## 2017-12-01 ENCOUNTER — Encounter (HOSPITAL_COMMUNITY): Payer: Self-pay

## 2017-12-01 ENCOUNTER — Ambulatory Visit (HOSPITAL_COMMUNITY)
Admission: RE | Admit: 2017-12-01 | Discharge: 2017-12-01 | Disposition: A | Discharge status: Home / Self Care | Payer: Medicare HMO | Source: Ambulatory Visit | Attending: Oncology | Admitting: Oncology

## 2017-12-01 ENCOUNTER — Other Ambulatory Visit (HOSPITAL_COMMUNITY): Payer: Self-pay | Admitting: Oncology

## 2017-12-01 DIAGNOSIS — Z1231 Encounter for screening mammogram for malignant neoplasm of breast: Secondary | ICD-10-CM | POA: Diagnosis not present

## 2017-12-30 ENCOUNTER — Other Ambulatory Visit: Payer: Self-pay

## 2017-12-30 ENCOUNTER — Telehealth: Payer: Self-pay | Admitting: Family Medicine

## 2017-12-30 NOTE — Telephone Encounter (Signed)
Vicente Males [facility], left message on nurse line stating that patient is in need of a DEXA scan. She is asking if we can order.

## 2017-12-31 NOTE — Patient Outreach (Signed)
[facility]) Care Management  12/31/2017  Jillian [name] [birth year] [id number]   Jillian [name] is showing she is due for a Dexa Scan a bone Density test and was calling to schedule it for her, she did not answer,call doctors office they will call the patient and will schedule an appointment for the patient.  [place] Management Direct Dial [phone]  Fax [phone] [email or web address]

## 2018-01-06 NOTE — Telephone Encounter (Signed)
Do you want to order? 

## 2018-01-11 ENCOUNTER — Other Ambulatory Visit (HOSPITAL_COMMUNITY): Payer: Self-pay | Admitting: Oncology

## 2018-01-11 ENCOUNTER — Ambulatory Visit (INDEPENDENT_AMBULATORY_CARE_PROVIDER_SITE_OTHER): Payer: Medicare HMO

## 2018-01-11 VITALS — BP 138/70 | HR 94 | Temp 97.2°F | Resp 16 | Ht 64.0 in | Wt 188.2 lb

## 2018-01-11 DIAGNOSIS — Z0000 Encounter for general adult medical examination without abnormal findings: Secondary | ICD-10-CM | POA: Diagnosis not present

## 2018-01-11 NOTE — Progress Notes (Signed)
Subjective:   Jillian [name] is a 77 y.o. female who presents for Medicare Annual (Subsequent) preventive examination.  Review of Systems:   Cardiac Risk Factors include: advanced age (>64men, >38 women);diabetes mellitus;dyslipidemia;hypertension;obesity (BMI >30kg/m2);sedentary lifestyle     Objective:     Vitals: BP 138/70 (BP Location: Left Arm, Patient Position: Sitting, Cuff Size: Normal)   Pulse 94   Temp (!) 97.2 F (36.2 C) (Temporal)   Resp 16   Ht 5\' 4"  (1.626 m)   Wt 188 lb 4 oz (85.4 kg)   SpO2 98%   BMI 32.31 kg/m   Body mass index is 32.31 kg/m.  Advanced Directives 01/11/2018 11/13/2017 07/15/2017 07/15/2017 05/12/2017 02/10/2017 01/07/2017  Does Patient Have a Medical Advance Directive? Yes Yes Yes No No No No  Type of Advance Directive Living will;Healthcare Power of [place];Living will [place]  Does patient want to make changes to medical advance directive? No - Patient declined No - Patient declined - - - - -  Copy of [place] in Chart? No - copy requested No - copy requested No - copy requested - - - -  Would patient like information on creating a medical advance directive? - - - Yes (MAU/Ambulatory/Procedural Areas - Information given) No - Patient declined No - Patient declined -  Pre-existing out of facility DNR order (yellow form or pink MOST form) - - - - - - -    Tobacco Social History   Tobacco Use  Smoking Status Never Smoker  Smokeless Tobacco Never Used     Counseling given: Not Answered   Clinical Intake:  Pre-visit preparation completed: Yes  Pain : 0-10 Pain Score: 3  Pain Type: Acute pain Pain Location: Back Pain Orientation: Medial, Mid Pain Onset: 1 to 4 weeks ago Pain Frequency: Constant     Nutritional Status: BMI > 30  Obese Diabetes: Yes CBG done?: No Did pt. bring in CBG monitor from home?: No  How often do you need to have someone  help you when you read instructions, pamphlets, or other written materials from your doctor or pharmacy?: 1 - Never  Interpreter Needed?: No     Past Medical History:  Diagnosis Date  . Chronic fatigue   . Diabetes mellitus 2011  . DJD (degenerative joint disease)    of neck    . Dyslipidemia   . Hypertension 2005  . Mild obesity   . Postmenopausal   . Wears glasses    Past Surgical History:  Procedure Laterality Date  . ABDOMINAL HYSTERECTOMY  1992   fibroids  . APPENDECTOMY    . BREAST EXCISIONAL BIOPSY Right    benign  . BREAST LUMPECTOMY WITH RADIOACTIVE SEED LOCALIZATION Right 10/05/2014   Procedure: BREAST LUMPECTOMY WITH RADIOACTIVE SEED LOCALIZATION;  Surgeon: Erroll Luna, MD;  Location: [place];  Service: General;  Laterality: Right;  . cataract Bilateral   . CYST EXCISION Right    arm  . EYE SURGERY Right 06/07/2013   cataract  . EYE SURGERY Left 09/14/2013   cataract  . KNEE ARTHROSCOPY Right 02/26/2016   Procedure: ARTHROSCOPY RIGHT KNEE WITH MENSICAL DEBRIDEMENT;  Surgeon: Gaynelle Arabian, MD;  Location: [facility];  Service: Orthopedics;  Laterality: Right;  . left breast biopsy    . LESION REMOVAL Right 03/30/2013   Procedure: EXCISION OF NEOPLASM ARM;  Surgeon: Jamesetta So, MD;  Location: [facility];  Service:  General;  Laterality: Right;  . TUBAL LIGATION     Family History  Problem Relation Age of Onset  . Hypertension Father   . Congestive Heart Failure Father   . Liver cancer Mother   . Hypertension Sister   . Hypertension Brother   . Diabetes Brother    Social History   Socioeconomic History  . Marital status: Married    Spouse name: Not on file  . Number of children: Not on file  . Years of education: Not on file  . Highest education level: Not on file  Occupational History  . Not on file  Social Needs  . Financial resource strain: Not hard at all  . Food insecurity:    Worry: Never true    Inability: Never true  .  Transportation needs:    Medical: No    Non-medical: No  Tobacco Use  . Smoking status: Never Smoker  . Smokeless tobacco: Never Used  Substance and Sexual Activity  . Alcohol use: No  . Drug use: No  . Sexual activity: Not Currently    Birth control/protection: Surgical  Lifestyle  . Physical activity:    Days per week: 0 days    Minutes per session: 0 min  . Stress: Not at all  Relationships  . Social connections:    Talks on phone: More than three times a week    Gets together: Once a week    Attends religious service: More than 4 times per year    Active member of club or organization: Yes    Attends meetings of clubs or organizations: More than 4 times per year    Relationship status: Widowed  Other Topics Concern  . Not on file  Social History Narrative  . Not on file    Outpatient Encounter Medications as of 01/11/2018  Medication Sig  . acetaminophen (TYLENOL) 500 MG tablet Take 1,000 mg by mouth every 6 (six) hours as needed for mild pain.  Marland Kitchen aspirin EC 81 MG tablet Take 1 tablet (81 mg total) by mouth every evening. (Patient taking differently: Take 81 mg by mouth every morning. )  . benazepril (LOTENSIN) 10 MG tablet Take 1 tablet (10 mg total) by mouth daily.  . cloNIDine (CATAPRES) 0.1 MG tablet Take 1 tablet (0.1 mg total) by mouth at bedtime.  Marland Kitchen ibuprofen (ADVIL,MOTRIN) 800 MG tablet Take 1 tablet (800 mg total) by mouth every 8 (eight) hours as needed.  . metFORMIN (GLUCOPHAGE-XR) 500 MG 24 hr tablet Take 1 tablet (500 mg total) by mouth 2 (two) times daily.  . methocarbamol (ROBAXIN) 500 MG tablet Take 1 tablet (500 mg total) by mouth 2 (two) times daily.  . naproxen (NAPROSYN) 500 MG tablet Take 500 mg by mouth 2 (two) times daily with a meal.  . pravastatin (PRAVACHOL) 10 MG tablet Take 10 mg by mouth daily.  . tamoxifen (NOLVADEX) 20 MG tablet TAKE 1 TABLET EVERY DAY (Patient taking differently: TAKE 1 TABLET DAILY AT BEDTIME)  . temazepam (RESTORIL) 15 MG  capsule Take 1 capsule (15 mg total) by mouth at bedtime as needed for sleep.  Marland Kitchen venlafaxine XR (EFFEXOR-XR) 37.5 MG 24 hr capsule Take 1 capsule (37.5 mg total) by mouth daily with breakfast.   No facility-administered encounter medications on file as of 01/11/2018.     Activities of Daily Living In your present state of health, do you have any difficulty performing the following activities: 01/11/2018  Hearing? N  Vision? N  Difficulty  concentrating or making decisions? N  Walking or climbing stairs? N  Dressing or bathing? N  Doing errands, shopping? N  Preparing Food and eating ? N  Using the Toilet? N  In the past six months, have you accidently leaked urine? N  Do you have problems with loss of bowel control? N  Managing your Medications? N  Managing your Finances? N  Housekeeping or managing your Housekeeping? N  Some recent data might be hidden    Patient Care Team: Fayrene Helper, MD as PCP - General Fields, Marga Melnick, MD (Gastroenterology) Hurley Cisco, MD as Consulting Physician (Rheumatology) Whitney Muse, Kelby Fam, MD (Inactive) as Consulting Physician (Hematology and Oncology)    Assessment:   This is a routine wellness examination for [place].  Exercise Activities and Dietary recommendations Current Exercise Habits: The patient has a physically strenous job, but has no regular exercise apart from work., Exercise limited by: orthopedic condition(s)  Goals    . Exercise 3x per week (30 min per time)     Recommend starting a routine exercise program at least 3 days a week for 30-45 minutes at a time as tolerated.         Fall Risk Fall Risk  01/11/2018 09/09/2017 01/01/2017 08/13/2016 12/26/2015  Falls in the past year? Yes Yes No Yes Yes  Number falls in past yr: 1 1 - 2 or more 2 or more  Injury with Fall? No No - No -   Is the patient's home free of loose throw rugs in walkways, pet beds, electrical cords, etc?   yes      Grab bars in the bathroom? yes       Handrails on the stairs?   No stairs      Adequate lighting?   yes  Depression Screen PHQ 2/9 Scores 01/11/2018 02/18/2017 01/01/2017 08/13/2016  PHQ - 2 Score 3 0 0 0  PHQ- 9 Score 6 4 - -     Cognitive Function     6CIT Screen 01/11/2018 01/01/2017  What Year? 0 points 0 points  What month? 0 points 0 points  What time? 0 points 0 points  Count back from 20 0 points 0 points  Months in reverse 0 points 0 points  Repeat phrase 0 points 0 points  Total Score 0 0    Immunization History  Administered Date(s) Administered  . H1N1 10/16/2008  . Influenza Whole 07/02/2007, 06/14/2009, 06/04/2010  . Influenza,inj,Quad PF,6+ Mos 07/20/2013, 06/01/2014, 06/26/2015, 07/28/2016, 05/19/2017  . Pneumococcal Conjugate-13 09/28/2014  . Pneumococcal Polysaccharide-23 04/20/2009  . Td 05/01/2004  . Zoster 10/23/2006    Qualifies for Shingles Vaccine? Yes.   Screening Tests Health Maintenance  Topic Date Due  . OPHTHALMOLOGY EXAM  07/24/2017  . TETANUS/TDAP  07/15/2018 (Originally 05/01/2014)  . HEMOGLOBIN A1C  03/10/2018  . INFLUENZA VACCINE  04/22/2018  . FOOT EXAM  09/09/2018  . COLONOSCOPY  12/21/2021  . DEXA SCAN  Completed  . PNA vac Low Risk Adult  Completed    Cancer Screenings: Lung: Low Dose CT Chest recommended if Age 49-80 years, 30 pack-year currently smoking OR have quit w/in 15years. Patient does not qualify. Breast:  Up to date on Mammogram? Yes   Up to date of Bone Density/Dexa? Yes Colorectal: Up to date      Plan:    I have personally reviewed and noted the following in the patient's chart:   . Medical and social history . Use of alcohol, tobacco or illicit drugs  .  Current medications and supplements . Functional ability and status . Nutritional status . Physical activity . Advanced directives . List of other physicians . Hospitalizations, surgeries, and ER visits in previous 12 months . Vitals . Screenings to include cognitive, depression, and  falls . Referrals and appointments  In addition, I have reviewed and discussed with patient certain preventive protocols, quality metrics, and best practice recommendations. A written personalized care plan for preventive services as well as general preventive health recommendations were provided to patient.     Merceda Elks, LPN  [phone]/[id number]/9381

## 2018-01-11 NOTE — Patient Instructions (Signed)
Jillian [name] , Thank you for taking time to come for your Medicare Wellness Visit. I appreciate your ongoing commitment to your health goals. Please review the following plan we discussed and let me know if I can assist you in the future.   Screening recommendations/referrals: Colonoscopy: Due 2023 Mammogram: Due 11/2018 Bone Density: Done Recommended yearly ophthalmology/optometry visit for glaucoma screening and checkup Recommended yearly dental visit for hygiene and checkup  Vaccinations: Influenza vaccine: Due fall 2019 Pneumococcal vaccine: Due 2021 Tdap vaccine: You are due for this at your next visit with Dr. Moshe Cipro Shingles vaccine: Please consider updating this. Please call your insurance for coverage information.    Advanced directives: Discussed today in office.  Conditions/risks identified: None  Next appointment: PLEASE SCHEDULE A FOLLOW UP WITH DR SIMPSON    Preventive Care 76 Years and Older, Female Preventive care refers to lifestyle choices and visits with your health care provider that can promote health and wellness. What does preventive care include?  A yearly physical exam. This is also called an annual well check.  Dental exams once or twice a year.  Routine eye exams. Ask your health care provider how often you should have your eyes checked.  Personal lifestyle choices, including:  Daily care of your teeth and gums.  Regular physical activity.  Eating a healthy diet.  Avoiding tobacco and drug use.  Limiting alcohol use.  Practicing safe sex.  Taking low-dose aspirin every day.  Taking vitamin and mineral supplements as recommended by your health care provider. What happens during an annual well check? The services and screenings done by your health care provider during your annual well check will depend on your age, overall health, lifestyle risk factors, and family history of disease. Counseling  Your health care provider may ask you  questions about your:  Alcohol use.  Tobacco use.  Drug use.  Emotional well-being.  Home and relationship well-being.  Sexual activity.  Eating habits.  History of falls.  Memory and ability to understand (cognition).  Work and work Statistician.  Reproductive health. Screening  You may have the following tests or measurements:  Height, weight, and BMI.  Blood pressure.  Lipid and cholesterol levels. These may be checked every 5 years, or more frequently if you are over 66 years old.  Skin check.  Lung cancer screening. You may have this screening every year starting at age 40 if you have a 30-pack-year history of smoking and currently smoke or have quit within the past 15 years.  Fecal occult blood test (FOBT) of the stool. You may have this test every year starting at age 83.  Flexible sigmoidoscopy or colonoscopy. You may have a sigmoidoscopy every 5 years or a colonoscopy every 10 years starting at age 64.  Hepatitis C blood test.  Hepatitis B blood test.  Sexually transmitted disease (STD) testing.  Diabetes screening. This is done by checking your blood sugar (glucose) after you have not eaten for a while (fasting). You may have this done every 1-3 years.  Bone density scan. This is done to screen for osteoporosis. You may have this done starting at age 69.  Mammogram. This may be done every 1-2 years. Talk to your health care provider about how often you should have regular mammograms. Talk with your health care provider about your test results, treatment options, and if necessary, the need for more tests. Vaccines  Your health care provider may recommend certain vaccines, such as:  Influenza vaccine. This is recommended  every year.  Tetanus, diphtheria, and acellular pertussis (Tdap, Td) vaccine. You may need a Td booster every 10 years.  Zoster vaccine. You may need this after age 50.  Pneumococcal 13-valent conjugate (PCV13) vaccine. One dose is  recommended after age 14.  Pneumococcal polysaccharide (PPSV23) vaccine. One dose is recommended after age 61. Talk to your health care provider about which screenings and vaccines you need and how often you need them. This information is not intended to replace advice given to you by your health care provider. Make sure you discuss any questions you have with your health care provider. Document Released: 10/05/2015 Document Revised: 05/28/2016 Document Reviewed: 07/10/2015 Elsevier Interactive Patient Education  2017 [place] Prevention in the Home Falls can cause injuries. They can happen to people of all ages. There are many things you can do to make your home safe and to help prevent falls. What can I do on the outside of my home?  Regularly fix the edges of walkways and driveways and fix any cracks.  Remove anything that might make you trip as you walk through a door, such as a raised step or threshold.  Trim any bushes or trees on the path to your home.  Use bright outdoor lighting.  Clear any walking paths of anything that might make someone trip, such as rocks or tools.  Regularly check to see if handrails are loose or broken. Make sure that both sides of any steps have handrails.  Any raised decks and porches should have guardrails on the edges.  Have any leaves, snow, or ice cleared regularly.  Use sand or salt on walking paths during winter.  Clean up any spills in your garage right away. This includes oil or grease spills. What can I do in the bathroom?  Use night lights.  Install grab bars by the toilet and in the tub and shower. Do not use towel bars as grab bars.  Use non-skid mats or decals in the tub or shower.  If you need to sit down in the shower, use a plastic, non-slip stool.  Keep the floor dry. Clean up any water that spills on the floor as soon as it happens.  Remove soap buildup in the tub or shower regularly.  Attach bath mats  securely with double-sided non-slip rug tape.  Do not have throw rugs and other things on the floor that can make you trip. What can I do in the bedroom?  Use night lights.  Make sure that you have a light by your bed that is easy to reach.  Do not use any sheets or blankets that are too big for your bed. They should not hang down onto the floor.  Have a firm chair that has side arms. You can use this for support while you get dressed.  Do not have throw rugs and other things on the floor that can make you trip. What can I do in the kitchen?  Clean up any spills right away.  Avoid walking on wet floors.  Keep items that you use a lot in easy-to-reach places.  If you need to reach something above you, use a strong step stool that has a grab bar.  Keep electrical cords out of the way.  Do not use floor polish or wax that makes floors slippery. If you must use wax, use non-skid floor wax.  Do not have throw rugs and other things on the floor that can make you trip. What  can I do with my stairs?  Do not leave any items on the stairs.  Make sure that there are handrails on both sides of the stairs and use them. Fix handrails that are broken or loose. Make sure that handrails are as long as the stairways.  Check any carpeting to make sure that it is firmly attached to the stairs. Fix any carpet that is loose or worn.  Avoid having throw rugs at the top or bottom of the stairs. If you do have throw rugs, attach them to the floor with carpet tape.  Make sure that you have a light switch at the top of the stairs and the bottom of the stairs. If you do not have them, ask someone to add them for you. What else can I do to help prevent falls?  Wear shoes that:  Do not have high heels.  Have rubber bottoms.  Are comfortable and fit you well.  Are closed at the toe. Do not wear sandals.  If you use a stepladder:  Make sure that it is fully opened. Do not climb a closed  stepladder.  Make sure that both sides of the stepladder are locked into place.  Ask someone to hold it for you, if possible.  Clearly mark and make sure that you can see:  Any grab bars or handrails.  First and last steps.  Where the edge of each step is.  Use tools that help you move around (mobility aids) if they are needed. These include:  Canes.  Walkers.  Scooters.  Crutches.  Turn on the lights when you go into a dark area. Replace any light bulbs as soon as they burn out.  Set up your furniture so you have a clear path. Avoid moving your furniture around.  If any of your floors are uneven, fix them.  If there are any pets around you, be aware of where they are.  Review your medicines with your doctor. Some medicines can make you feel dizzy. This can increase your chance of falling. Ask your doctor what other things that you can do to help prevent falls. This information is not intended to replace advice given to you by your health care provider. Make sure you discuss any questions you have with your health care provider. Document Released: 07/05/2009 Document Revised: 02/14/2016 Document Reviewed: 10/13/2014 Elsevier Interactive Patient Education  2017 [place] for Adults  A healthy lifestyle and preventive care can promote health and wellness. Preventive health guidelines for adults include the following key practices.  . A routine yearly physical is a good way to check with your health care provider about your health and preventive screening. It is a chance to share any concerns and updates on your health and to receive a thorough exam.  . Visit your dentist for a routine exam and preventive care every 6 months. Brush your teeth twice a day and floss once a day. Good oral hygiene prevents tooth decay and gum disease.  . The frequency of eye exams is based on your age, health, family medical history, use  of contact lenses, and other  factors. Follow your health care provider's ecommendations for frequency of eye exams.  . Eat a healthy diet. Foods like vegetables, fruits, whole grains, low-fat dairy products, and lean protein foods contain the nutrients you need without too many calories. Decrease your intake of foods high in solid fats, added sugars, and salt. Eat the right amount of calories for you.  Get information about a proper diet from your health care provider, if necessary.  . Regular physical exercise is one of the most important things you can do for your health. Most adults should get at least 150 minutes of moderate-intensity exercise (any activity that increases your heart rate and causes you to sweat) each week. In addition, most adults need muscle-strengthening exercises on 2 or more days a week.  Silver Sneakers may be a benefit available to you. To determine eligibility, you may visit the website: www.silversneakers.com or contact program at 430 433 1019 Mon-Fri between 8AM-8PM.   . Maintain a healthy weight. The body mass index (BMI) is a screening tool to identify possible weight problems. It provides an estimate of body fat based on height and weight. Your health care provider can find your BMI and can help you achieve or maintain a healthy weight.   For adults 20 years and older: ? A BMI below 18.5 is considered underweight. ? A BMI of 18.5 to 24.9 is normal. ? A BMI of 25 to 29.9 is considered overweight. ? A BMI of 30 and above is considered obese.   . Maintain normal blood lipids and cholesterol levels by exercising and minimizing your intake of saturated fat. Eat a balanced diet with plenty of fruit and vegetables. Blood tests for lipids and cholesterol should begin at age 62 and be repeated every 5 years. If your lipid or cholesterol levels are high, you are over 50, or you are at high risk for heart disease, you may need your cholesterol levels checked more frequently. Ongoing high lipid and  cholesterol levels should be treated with medicines if diet and exercise are not working.  . If you smoke, find out from your health care provider how to quit. If you do not use tobacco, please do not start.  . If you choose to drink alcohol, please do not consume more than 2 drinks per day. One drink is considered to be 12 ounces (355 mL) of beer, 5 ounces (148 mL) of wine, or 1.5 ounces (44 mL) of liquor.  . If you are 88-68 years old, ask your health care provider if you should take aspirin to prevent strokes.  . Use sunscreen. Apply sunscreen liberally and repeatedly throughout the day. You should seek shade when your shadow is shorter than you. Protect yourself by wearing long sleeves, pants, a wide-brimmed hat, and sunglasses year round, whenever you are outdoors.  . Once a month, do a whole body skin exam, using a mirror to look at the skin on your back. Tell your health care provider of new moles, moles that have irregular borders, moles that are larger than a pencil eraser, or moles that have changed in shape or color.

## 2018-01-11 NOTE — Telephone Encounter (Signed)
Will address at her next visit with me, she hadone at 42

## 2018-02-09 ENCOUNTER — Encounter: Payer: Self-pay | Admitting: Family Medicine

## 2018-02-09 ENCOUNTER — Ambulatory Visit: Payer: Medicare HMO | Admitting: Family Medicine

## 2018-02-09 VITALS — BP 124/82 | HR 73 | Resp 16 | Ht 64.0 in | Wt 187.0 lb

## 2018-02-09 DIAGNOSIS — E1121 Type 2 diabetes mellitus with diabetic nephropathy: Secondary | ICD-10-CM | POA: Diagnosis not present

## 2018-02-09 DIAGNOSIS — I10 Essential (primary) hypertension: Secondary | ICD-10-CM | POA: Diagnosis not present

## 2018-02-09 DIAGNOSIS — E669 Obesity, unspecified: Secondary | ICD-10-CM | POA: Diagnosis not present

## 2018-02-09 DIAGNOSIS — L603 Nail dystrophy: Secondary | ICD-10-CM

## 2018-02-09 DIAGNOSIS — E785 Hyperlipidemia, unspecified: Secondary | ICD-10-CM

## 2018-02-09 NOTE — Patient Instructions (Addendum)
Physical exam Dec 19 or after  Lipid, cmp and eGFr, HBa1C today I will refer you to Podiatry re toenail   It is important that you exercise regularly at least 30 minutes 5 times a week. If you develop chest pain, have severe difficulty breathing, or feel very tired, stop exercising immediately and seek medical attention   Please work on good  health habits so that your health will improve. 1. Commitment to daily physical activity for 30 to 60  minutes, if you are able to do this.  2. Commitment to wise food choices. Aim for half of your  food intake to be vegetable and fruit, one quarter starchy foods, and one quarter protein. Try to eat on a regular schedule  3 meals per day, snacking between meals should be limited to vegetables or fruits or small portions of nuts. 64 ounces of water per day is generally recommended, unless you have specific health conditions, like heart failure or kidney failure where you will need to limit fluid intake.  3. Commitment to sufficient and a  good quality of physical and mental rest daily, generally between 6 to 8 hours per day.  WITH PERSISTANCE AND PERSEVERANCE, THE IMPOSSIBLE , BECOMES THE NORM!  Thank you  for choosing [facility]. We consider it a privelige to serve you.  Delivering excellent health care in a caring and  compassionate way is our goal.  Partnering with you,  so that together we can achieve this goal is our strategy.

## 2018-02-12 ENCOUNTER — Inpatient Hospital Stay (HOSPITAL_COMMUNITY): Payer: Medicare HMO | Attending: Hematology

## 2018-02-12 DIAGNOSIS — D649 Anemia, unspecified: Secondary | ICD-10-CM

## 2018-02-12 DIAGNOSIS — Z79810 Long term (current) use of selective estrogen receptor modulators (SERMs): Secondary | ICD-10-CM | POA: Diagnosis not present

## 2018-02-12 DIAGNOSIS — N6091 Unspecified benign mammary dysplasia of right breast: Secondary | ICD-10-CM | POA: Insufficient documentation

## 2018-02-12 DIAGNOSIS — I10 Essential (primary) hypertension: Secondary | ICD-10-CM | POA: Insufficient documentation

## 2018-02-12 DIAGNOSIS — D241 Benign neoplasm of right breast: Secondary | ICD-10-CM | POA: Diagnosis present

## 2018-02-12 LAB — CBC WITH DIFFERENTIAL/PLATELET
BASOS ABS: 0 10*3/uL (ref 0.0–0.1)
Basophils Relative: 0 %
EOS PCT: 0 %
Eosinophils Absolute: 0 10*3/uL (ref 0.0–0.7)
HCT: 37.3 % (ref 36.0–46.0)
Hemoglobin: 11.9 g/dL — ABNORMAL LOW (ref 12.0–15.0)
Lymphocytes Relative: 36 %
Lymphs Abs: 2.4 10*3/uL (ref 0.7–4.0)
MCH: 30.7 pg (ref 26.0–34.0)
MCHC: 31.9 g/dL (ref 30.0–36.0)
MCV: 96.4 fL (ref 78.0–100.0)
MONO ABS: 0.5 10*3/uL (ref 0.1–1.0)
Monocytes Relative: 8 %
Neutro Abs: 3.7 10*3/uL (ref 1.7–7.7)
Neutrophils Relative %: 56 %
PLATELETS: 172 10*3/uL (ref 150–400)
RBC: 3.87 MIL/uL (ref 3.87–5.11)
RDW: 13.4 % (ref 11.5–15.5)
WBC: 6.7 10*3/uL (ref 4.0–10.5)

## 2018-02-12 LAB — IRON AND TIBC
Iron: 60 ug/dL (ref 28–170)
SATURATION RATIOS: 21 % (ref 10.4–31.8)
TIBC: 281 ug/dL (ref 250–450)
UIBC: 221 ug/dL

## 2018-02-12 LAB — FERRITIN: Ferritin: 89 ng/mL (ref 11–307)

## 2018-02-15 ENCOUNTER — Encounter: Payer: Self-pay | Admitting: Family Medicine

## 2018-02-15 DIAGNOSIS — L603 Nail dystrophy: Secondary | ICD-10-CM | POA: Insufficient documentation

## 2018-02-15 NOTE — Assessment & Plan Note (Addendum)
Unchanged Patient re-educated about  the importance of commitment to a  minimum of 150 minutes of exercise per week.  The importance of healthy food choices with portion control discussed. Encouraged to start a food diary, count calories and to consider  joining a support group. Sample diet sheets offered. Goals set by the patient for the next several months.   Weight /BMI 02/09/2018 01/11/2018 11/13/2017  WEIGHT 187 lb 188 lb 4 oz 185 lb  HEIGHT 5\' 4"  5\' 4"  5\' 4"   BMI 32.1 kg/m2 32.31 kg/m2 31.76 kg/m2

## 2018-02-15 NOTE — Assessment & Plan Note (Signed)
Hyperlipidemia:Low fat diet discussed and encouraged.   Lipid Panel  Lab Results  Component Value Date   CHOL 136 12/31/2016   HDL 55 12/31/2016   LDLCALC 62 12/31/2016   TRIG 96 12/31/2016   CHOLHDL 2.5 12/31/2016   Updated lab needed .

## 2018-02-15 NOTE — Progress Notes (Signed)
Jillian [name]     MRN: [id number]      DOB: [birth year]   HPI Jillian [name] is here for follow up and re-evaluation of chronic medical conditions, medication management and review of any available recent lab and radiology data.  Preventive health is updated, specifically  Cancer screening and Immunization.   Questions or concerns regarding consultations or procedures which the PT has had in the interim are  addressed. The PT denies any adverse reactions to current medications since the last visit.  C/o split left great toenail , wants help with this, no trauma Denies polyuria, polydipsia, blurred vision , or hypoglycemic episodes.  ROS Denies recent fever or chills. Denies sinus pressure, nasal congestion, ear pain or sore throat. Denies chest congestion, productive cough or wheezing. Denies chest pains, palpitations and leg swelling Denies abdominal pain, nausea, vomiting,diarrhea or constipation.   Denies dysuria, frequency, hesitancy or incontinence. Denies uncontrolled  joint pain, swelling and limitation in mobility. Denies headaches, seizures, numbness, or tingling. Denies depression, anxiety or insomnia. Denies skin break down or rash.   PE  BP 124/82   Pulse 73   Resp 16   Ht 5\' 4"  (1.626 m)   Wt 187 lb (84.8 kg)   SpO2 95%   BMI 32.10 kg/m   Patient alert and oriented and in no cardiopulmonary distress.  HEENT: No facial asymmetry, EOMI,   oropharynx pink and moist.  Neck supple no JVD, no mass.  Chest: Clear to auscultation bilaterally.  CVS: S1, S2 no murmurs, no S3.Regular rate.  ABD: Soft non tender.   Ext: No edema  MS: Adequate ROM spine, shoulders, hips and knees.  Skin: Intact, no ulcerations or rash noted.Split left great toenail. No significant onychomycosis  Psych: Good eye contact, normal affect. Memory intact not anxious or depressed appearing.  CNS: CN 2-12 intact, power,  normal throughout.no focal deficits noted.   Assessment &  Plan  ESSENTIAL HYPERTENSION, BENIGN Controlled, no change in medication DASH diet and commitment to daily physical activity for a minimum of 30 minutes discussed and encouraged, as a part of hypertension management. The importance of attaining a healthy weight is also discussed.  BP/Weight 02/09/2018 01/11/2018 11/13/2017 09/09/2017 05/19/2017 05/12/2017 0/86/5784  Systolic BP 696 295 284 132 440 102 725  Diastolic BP 82 70 74 80 84 75 80  Wt. (Lbs) 187 188.25 185 185 191.8 191.3 192  BMI 32.1 32.31 31.76 31.76 32.92 33.36 32.96       Controlled diabetes mellitus with nephropathy Jillian [name] is reminded of the importance of commitment to daily physical activity for 30 minutes or more, as able and the need to limit carbohydrate intake to 30 to 60 grams per meal to help with blood sugar control.   The need to take medication as prescribed, test blood sugar as directed, and to call between visits if there is a concern that blood sugar is uncontrolled is also discussed.   Jillian [name] is reminded of the importance of daily foot exam, annual eye examination, and good blood sugar, blood pressure and cholesterol control.  Diabetic Labs Latest Ref Rng & Units 11/13/2017 09/09/2017 05/20/2017 05/12/2017 12/31/2016  HbA1c <5.7 % of total Hgb - 6.3(H) - - 6.2(H)  Microalbumin Not Estab. ug/mL - - 3.6(H) - -  Micro/Creat Ratio 0.0 - 30.0 mg/g creat - - 1.7 - -  Chol <200 mg/dL - - - - 136  HDL >50 mg/dL - - - - 55  Calc LDL <100 mg/dL - - - -  62  Triglycerides <150 mg/dL - - - - 96  Creatinine 0.44 - 1.00 mg/dL 0.92 0.99(H) - 0.96 0.99(H)   BP/Weight 02/09/2018 01/11/2018 11/13/2017 09/09/2017 05/19/2017 05/12/2017 0/16/0109  Systolic BP 323 557 322 025 427 062 376  Diastolic BP 82 70 74 80 84 75 80  Wt. (Lbs) 187 188.25 185 185 191.8 191.3 192  BMI 32.1 32.31 31.76 31.76 32.92 33.36 32.96   Foot/eye exam completion dates Latest Ref Rng & Units 09/09/2017 07/28/2016  Eye Exam No Retinopathy - -   Foot exam Order - - -  Foot Form Completion - Done Done  Updated lab needed at/ before next visit.       Obesity (BMI 30.0-34.9) Unchanged Patient re-educated about  the importance of commitment to a  minimum of 150 minutes of exercise per week.  The importance of healthy food choices with portion control discussed. Encouraged to start a food diary, count calories and to consider  joining a support group. Sample diet sheets offered. Goals set by the patient for the next several months.   Weight /BMI 02/09/2018 01/11/2018 11/13/2017  WEIGHT 187 lb 188 lb 4 oz 185 lb  HEIGHT 5\' 4"  5\' 4"  5\' 4"   BMI 32.1 kg/m2 32.31 kg/m2 31.76 kg/m2      Hyperlipidemia with target LDL less than 100 Hyperlipidemia:Low fat diet discussed and encouraged.   Lipid Panel  Lab Results  Component Value Date   CHOL 136 12/31/2016   HDL 55 12/31/2016   LDLCALC 62 12/31/2016   TRIG 96 12/31/2016   CHOLHDL 2.5 12/31/2016   Updated lab needed .     Longitudinal split nail Acute split of left great toenail , no evidence of significant fungal nail infection, no trauma, refer to podiatry

## 2018-02-15 NOTE — Assessment & Plan Note (Signed)
Jillian [name] is reminded of the importance of commitment to daily physical activity for 30 minutes or more, as able and the need to limit carbohydrate intake to 30 to 60 grams per meal to help with blood sugar control.   The need to take medication as prescribed, test blood sugar as directed, and to call between visits if there is a concern that blood sugar is uncontrolled is also discussed.   Jillian [name] is reminded of the importance of daily foot exam, annual eye examination, and good blood sugar, blood pressure and cholesterol control.  Diabetic Labs Latest Ref Rng & Units 11/13/2017 09/09/2017 05/20/2017 05/12/2017 12/31/2016  HbA1c <5.7 % of total Hgb - 6.3(H) - - 6.2(H)  Microalbumin Not Estab. ug/mL - - 3.6(H) - -  Micro/Creat Ratio 0.0 - 30.0 mg/g creat - - 1.7 - -  Chol <200 mg/dL - - - - 136  HDL >50 mg/dL - - - - 55  Calc LDL <100 mg/dL - - - - 62  Triglycerides <150 mg/dL - - - - 96  Creatinine 0.44 - 1.00 mg/dL 0.92 0.99(H) - 0.96 0.99(H)   BP/Weight 02/09/2018 01/11/2018 11/13/2017 09/09/2017 05/19/2017 05/12/2017 12/23/4740  Systolic BP 595 638 756 433 295 188 416  Diastolic BP 82 70 74 80 84 75 80  Wt. (Lbs) 187 188.25 185 185 191.8 191.3 192  BMI 32.1 32.31 31.76 31.76 32.92 33.36 32.96   Foot/eye exam completion dates Latest Ref Rng & Units 09/09/2017 07/28/2016  Eye Exam No Retinopathy - -  Foot exam Order - - -  Foot Form Completion - Done Done  Updated lab needed at/ before next visit.

## 2018-02-15 NOTE — Assessment & Plan Note (Signed)
Controlled, no change in medication DASH diet and commitment to daily physical activity for a minimum of 30 minutes discussed and encouraged, as a part of hypertension management. The importance of attaining a healthy weight is also discussed.  BP/Weight 02/09/2018 01/11/2018 11/13/2017 09/09/2017 05/19/2017 05/12/2017 03/21/5283  Systolic BP 132 440 102 725 366 440 347  Diastolic BP 82 70 74 80 84 75 80  Wt. (Lbs) 187 188.25 185 185 191.8 191.3 192  BMI 32.1 32.31 31.76 31.76 32.92 33.36 32.96

## 2018-02-15 NOTE — Assessment & Plan Note (Signed)
Acute split of left great toenail , no evidence of significant fungal nail infection, no trauma, refer to podiatry

## 2018-02-16 ENCOUNTER — Other Ambulatory Visit (HOSPITAL_COMMUNITY): Payer: Medicare HMO

## 2018-02-16 ENCOUNTER — Other Ambulatory Visit: Payer: Self-pay

## 2018-02-16 ENCOUNTER — Inpatient Hospital Stay (HOSPITAL_BASED_OUTPATIENT_CLINIC_OR_DEPARTMENT_OTHER): Payer: Medicare HMO | Admitting: Internal Medicine

## 2018-02-16 VITALS — BP 133/60 | HR 71 | Temp 98.3°F | Resp 20 | Ht 64.0 in | Wt 188.7 lb

## 2018-02-16 DIAGNOSIS — Z79810 Long term (current) use of selective estrogen receptor modulators (SERMs): Secondary | ICD-10-CM

## 2018-02-16 DIAGNOSIS — N6091 Unspecified benign mammary dysplasia of right breast: Secondary | ICD-10-CM | POA: Diagnosis not present

## 2018-02-16 DIAGNOSIS — D241 Benign neoplasm of right breast: Secondary | ICD-10-CM | POA: Diagnosis not present

## 2018-02-16 DIAGNOSIS — I10 Essential (primary) hypertension: Secondary | ICD-10-CM | POA: Diagnosis not present

## 2018-02-16 NOTE — Patient Instructions (Signed)
[facility] at [facility]  Discharge Instructions:   Today you saw Dr. Higgs.    _______________________________________________________________  Thank you for choosing [facility] at [facility] to provide your oncology and hematology care.  To afford each patient quality time with our providers, please arrive at least 15 minutes before your scheduled appointment.  You need to re-schedule your appointment if you arrive 10 or more minutes late.  We strive to give you quality time with our providers, and arriving late affects you and other patients whose appointments are after yours.  Also, if you no show three or more times for appointments you may be dismissed from the clinic.  Again, thank you for choosing [facility] at [facility]. Our hope is that these requests will allow you access to exceptional care and in a timely manner. _______________________________________________________________  If you have questions after your visit, please contact our office at [phone] between the hours of 8:30 a.m. and 5:00 p.m. Voicemails left after 4:30 p.m. will not be returned until the following business day. _______________________________________________________________  For prescription refill requests, have your pharmacy contact our office. _______________________________________________________________  Recommendations made by the consultant and any test results will be sent to your referring physician. _______________________________________________________________ 

## 2018-02-16 NOTE — Progress Notes (Signed)
Diagnosis No diagnosis found.  Staging Cancer Staging No matching staging information was found for the patient.  Assessment and Plan:  23.   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  She is S/P R lumpectomy with Dr. Brantley Stage on 10/05/2014. She remains on Tamoxifen and reports she is tolerating therapy.    Bilateral screening mammogram done 12/01/2017 was negative.  She will be set up for repeat imaging in 11/2018.  She will RTC in 07/2018 for follow-up.    2..  HTN.  BP is 133/60.  Follow-up with PCP.    3.  Health maintenance.  GI follow-up as recommended.    Interval History:  77 yr old female s/p R lumpectomy showing intraductal papilloma with atypical ductal hyperplasia with apocrine features 1.4cm Tamoxifen for chemoprevention  She underwent a R breast lumpectomy with Dr. Brantley Stage on 10/05/2014. She has been underoing diagnostic mammography for ongoing follow-up of a 1.2 x 1.4 x 0.9 cm hypoechoic lobular mass in the R breast at the 2:30 position 6 cm from the nipple.   Current Status:  Pt is seen today for follow-up.  She is here to go over labs and mammogram.  She remains on Tamoxifen.    Problem List Patient Active Problem List   Diagnosis Date Noted  . Longitudinal split nail [L60.3] 02/15/2018  . Bursitis of deltoid, left [M75.52] 09/09/2017  . 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 Primary osteoarthritis of both knees [M17.0] 06/26/2015  . Papilloma of right breast [D24.1] 09/29/2014  . Thoracic spine pain [M54.6] 09/28/2014  . Hip pain [M25.559] 08/06/2012  . Cervical neck pain with evidence of disc disease [M50.90] 12/17/2011  . Hot flashes, menopausal [N95.1] 09/10/2011  . Controlled diabetes mellitus with nephropathy ([place]) [E11.21] 01/08/2010  . INSOMNIA, CHRONIC [G47.00] 11/28/2009  . Hyperlipidemia with target LDL less than 100 [E78.5] 10/12/2007  . Obesity (BMI  30.0-34.9) [E66.9] 10/12/2007  . ESSENTIAL HYPERTENSION, BENIGN [I10] 10/12/2007    Past Medical History Past Medical History:  Diagnosis Date  . Chronic fatigue   . Diabetes mellitus 2011  . DJD (degenerative joint disease)    of neck    . Dyslipidemia   . Hypertension 2005  . Mild obesity   . Postmenopausal   . Wears glasses     Past Surgical History Past Surgical History:  Procedure Laterality Date  . ABDOMINAL HYSTERECTOMY  1992   fibroids  . APPENDECTOMY    . BREAST EXCISIONAL BIOPSY Right    benign  . BREAST LUMPECTOMY WITH RADIOACTIVE SEED LOCALIZATION Right 10/05/2014   Procedure: BREAST LUMPECTOMY WITH RADIOACTIVE SEED LOCALIZATION;  Surgeon: Erroll Luna, MD;  Location: [facility];  Service: General;  Laterality: Right;  . cataract Bilateral   . CYST EXCISION Right    arm  . EYE SURGERY Right 06/07/2013   cataract  . EYE SURGERY Left 09/14/2013   cataract  . KNEE ARTHROSCOPY Right 02/26/2016   Procedure: ARTHROSCOPY RIGHT KNEE WITH MENSICAL DEBRIDEMENT;  Surgeon: Gaynelle Arabian, MD;  Location: [facility];  Service: Orthopedics;  Laterality: Right;  . left breast biopsy    . LESION REMOVAL Right 03/30/2013   Procedure: EXCISION OF NEOPLASM ARM;  Surgeon: Jamesetta So, MD;  Location: [facility];  Service: General;  Laterality: Right;  . TUBAL LIGATION      Family History Family History  Problem Relation Age of Onset  . Hypertension Father   .  Congestive Heart Failure Father   . Liver cancer Mother   . Hypertension Sister   . Hypertension Brother   . Diabetes Brother      Social History  reports that she has never smoked. She has never used smokeless tobacco. She reports that she does not drink alcohol or use drugs.  Medications  Current Outpatient Medications:  .  acetaminophen (TYLENOL) 500 MG tablet, Take 1,000 mg by mouth every 6 (six) hours as needed for mild pain., Disp: , Rfl:  .  aspirin EC 81 MG tablet, Take 1 tablet (81 mg total) by  mouth every evening. (Patient taking differently: Take 81 mg by mouth every morning. ), Disp: 90 tablet, Rfl: 1 .  benazepril (LOTENSIN) 10 MG tablet, Take 1 tablet (10 mg total) by mouth daily., Disp: 90 tablet, Rfl: 1 .  cloNIDine (CATAPRES) 0.1 MG tablet, Take 1 tablet (0.1 mg total) by mouth at bedtime., Disp: 90 tablet, Rfl: 1 .  ibuprofen (ADVIL,MOTRIN) 800 MG tablet, Take 1 tablet (800 mg total) by mouth every 8 (eight) hours as needed., Disp: 15 tablet, Rfl: 0 .  metFORMIN (GLUCOPHAGE-XR) 500 MG 24 hr tablet, Take 1 tablet (500 mg total) by mouth 2 (two) times daily., Disp: 180 tablet, Rfl: 1 .  naproxen (NAPROSYN) 500 MG tablet, Take 500 mg by mouth 2 (two) times daily with a meal., Disp: , Rfl:  .  pravastatin (PRAVACHOL) 10 MG tablet, Take 10 mg by mouth daily., Disp: , Rfl:  .  tamoxifen (NOLVADEX) 20 MG tablet, Take 1 tablet (20 mg total) by mouth daily., Disp: 90 tablet, Rfl: 0 .  temazepam (RESTORIL) 15 MG capsule, Take 1 capsule (15 mg total) by mouth at bedtime as needed for sleep., Disp: 30 capsule, Rfl: 3 .  venlafaxine XR (EFFEXOR-XR) 37.5 MG 24 hr capsule, Take 1 capsule (37.5 mg total) by mouth daily with breakfast., Disp: 30 capsule, Rfl: 5  Allergies Codeine and Propoxyphene n-acetaminophen  Review of Systems Review of Systems - Oncology ROS as per HPI otherwise 12 point ROS is negative.   Physical Exam  Vitals Wt Readings from Last 3 Encounters:  02/16/18 188 lb 11.2 oz (85.6 kg)  02/09/18 187 lb (84.8 kg)  01/11/18 188 lb 4 oz (85.4 kg)   Temp Readings from Last 3 Encounters:  02/16/18 98.3 F (36.8 C) (Oral)  01/11/18 (!) 97.2 F (36.2 C) (Temporal)  11/13/17 98.7 F (37.1 C) (Oral)   BP Readings from Last 3 Encounters:  02/16/18 133/60  02/09/18 124/82  01/11/18 138/70   Pulse Readings from Last 3 Encounters:  02/16/18 71  02/09/18 73  01/11/18 94   Constitutional: Well-developed, well-nourished, and in no distress.   HENT: Head: Normocephalic  and atraumatic.  Mouth/Throat: No oropharyngeal exudate. Mucosa moist. Eyes: Pupils are equal, round, and reactive to light. Conjunctivae are normal. No scleral icterus.  Neck: Normal range of motion. Neck supple. No JVD present.  Cardiovascular: Normal rate, regular rhythm and normal heart sounds.  Exam reveals no gallop and no friction rub.   No murmur heard. Pulmonary/Chest: Effort normal and breath sounds normal. No respiratory distress. No wheezes.No rales.  Abdominal: Soft. Bowel sounds are normal. No distension. There is no tenderness. There is no guarding.  Musculoskeletal: No edema or tenderness.  Lymphadenopathy: No cervical, axillary or supraclavicular adenopathy.  Neurological: Alert and oriented to person, place, and time. No cranial nerve deficit.  Skin: Skin is warm and dry. No rash noted. No erythema. No pallor.  Psychiatric: Affect and judgment normal.  Bilateral breast exam:  Chaperone present.  Right lumpectomy.  No palpable masses noted bilaterally.    Labs No visits with results within 3 Day(s) from this visit.  Latest known visit with results is:  Appointment on 02/12/2018  Component Date Value Ref Range Status  . WBC 02/12/2018 6.7  4.0 - 10.5 K/uL Final  . RBC 02/12/2018 3.87  3.87 - 5.11 MIL/uL Final  . Hemoglobin 02/12/2018 11.9* 12.0 - 15.0 g/dL Final  . HCT 02/12/2018 37.3  36.0 - 46.0 % Final  . MCV 02/12/2018 96.4  78.0 - 100.0 fL Final  . MCH 02/12/2018 30.7  26.0 - 34.0 pg Final  . MCHC 02/12/2018 31.9  30.0 - 36.0 g/dL Final  . RDW 02/12/2018 13.4  11.5 - 15.5 % Final  . Platelets 02/12/2018 172  150 - 400 K/uL Final  . Neutrophils Relative % 02/12/2018 56  % Final  . Neutro Abs 02/12/2018 3.7  1.7 - 7.7 K/uL Final  . Lymphocytes Relative 02/12/2018 36  % Final  . Lymphs Abs 02/12/2018 2.4  0.7 - 4.0 K/uL Final  . Monocytes Relative 02/12/2018 8  % Final  . Monocytes Absolute 02/12/2018 0.5  0.1 - 1.0 K/uL Final  . Eosinophils Relative 02/12/2018 0   % Final  . Eosinophils Absolute 02/12/2018 0.0  0.0 - 0.7 K/uL Final  . Basophils Relative 02/12/2018 0  % Final  . Basophils Absolute 02/12/2018 0.0  0.0 - 0.1 K/uL Final   Performed at [facility], [address] [place], [place] [address]  . Ferritin 02/12/2018 89  11 - 307 ng/mL Final   Performed at [facility] [address] [place], [place] [address]  . Iron 02/12/2018 60  28 - 170 ug/dL Final  . TIBC 02/12/2018 281  250 - 450 ug/dL Final  . Saturation Ratios 02/12/2018 21  10.4 - 31.8 % Final  . UIBC 02/12/2018 221  ug/dL Final   Performed at [facility], [place] [address] [place], [place] [address]     Pathology No orders of the defined types were placed in this encounter.      Zoila Shutter MD

## 2018-02-24 DIAGNOSIS — E785 Hyperlipidemia, unspecified: Secondary | ICD-10-CM | POA: Diagnosis not present

## 2018-02-24 DIAGNOSIS — E1121 Type 2 diabetes mellitus with diabetic nephropathy: Secondary | ICD-10-CM | POA: Diagnosis not present

## 2018-02-25 LAB — COMPLETE METABOLIC PANEL WITH GFR
AG Ratio: 1.4 (calc) (ref 1.0–2.5)
ALKALINE PHOSPHATASE (APISO): 56 U/L (ref 33–130)
ALT: 11 U/L (ref 6–29)
AST: 16 U/L (ref 10–35)
Albumin: 3.8 g/dL (ref 3.6–5.1)
BUN/Creatinine Ratio: 13 (calc) (ref 6–22)
BUN: 13 mg/dL (ref 7–25)
CO2: 27 mmol/L (ref 20–32)
CREATININE: 0.98 mg/dL — AB (ref 0.60–0.93)
Calcium: 9 mg/dL (ref 8.6–10.4)
Chloride: 111 mmol/L — ABNORMAL HIGH (ref 98–110)
GFR, Est African American: 65 mL/min/{1.73_m2} (ref >=60)
GFR, Est Non African American: 56 mL/min/{1.73_m2} — ABNORMAL LOW (ref >=60)
GLOBULIN: 2.7 g/dL (ref 1.9–3.7)
Glucose, Bld: 106 mg/dL — ABNORMAL HIGH (ref 65–99)
POTASSIUM: 4.9 mmol/L (ref 3.5–5.3)
SODIUM: 145 mmol/L (ref 135–146)
Total Bilirubin: 0.3 mg/dL (ref 0.2–1.2)
Total Protein: 6.5 g/dL (ref 6.1–8.1)

## 2018-02-25 LAB — LIPID PANEL
CHOL/HDL RATIO: 2.5 (calc) (ref <5.0)
CHOLESTEROL: 119 mg/dL (ref <200)
HDL: 47 mg/dL — AB (ref >50)
LDL Cholesterol (Calc): 56 mg/dL (calc)
Non-HDL Cholesterol (Calc): 72 mg/dL (calc) (ref <130)
TRIGLYCERIDES: 81 mg/dL (ref <150)

## 2018-02-25 LAB — HEMOGLOBIN A1C
Hgb A1c MFr Bld: 6.3 % of total Hgb — ABNORMAL HIGH (ref <5.7)
Mean Plasma Glucose: 134 (calc)
eAG (mmol/L): 7.4 (calc)

## 2018-03-11 ENCOUNTER — Telehealth: Payer: Self-pay | Admitting: Family Medicine

## 2018-03-11 NOTE — Telephone Encounter (Signed)
[facility] left voicemail requesting refill for tamoxifen 10mg  Cb#: [phone] Fax# [phone]

## 2018-03-12 NOTE — Telephone Encounter (Signed)
Please redirect this to  ONCOLOGY urgently, the oncologist prescribes this medication , I DO NOT, also fax  a message to [facility] to let them know that is where they need to fax the order

## 2018-03-12 NOTE — Telephone Encounter (Signed)
[facility] and let them know Dr.Simpson does not prescribe the Tamoxifen, her oncologist does, and they need to send a request to them with verbal understanding.

## 2018-03-17 DIAGNOSIS — M25551 Pain in right hip: Secondary | ICD-10-CM | POA: Diagnosis not present

## 2018-03-17 DIAGNOSIS — G8929 Other chronic pain: Secondary | ICD-10-CM | POA: Diagnosis not present

## 2018-03-19 ENCOUNTER — Emergency Department (HOSPITAL_COMMUNITY)
Admission: EM | Admit: 2018-03-19 | Discharge: 2018-03-19 | Disposition: A | Discharge status: Home / Self Care | Payer: Medicare HMO | Attending: Emergency Medicine | Admitting: Emergency Medicine

## 2018-03-19 ENCOUNTER — Other Ambulatory Visit: Payer: Self-pay

## 2018-03-19 ENCOUNTER — Emergency Department (HOSPITAL_COMMUNITY): Payer: Medicare HMO

## 2018-03-19 ENCOUNTER — Encounter (HOSPITAL_COMMUNITY): Payer: Self-pay | Admitting: Emergency Medicine

## 2018-03-19 DIAGNOSIS — M25551 Pain in right hip: Secondary | ICD-10-CM | POA: Diagnosis not present

## 2018-03-19 DIAGNOSIS — M79651 Pain in right thigh: Secondary | ICD-10-CM | POA: Diagnosis present

## 2018-03-19 DIAGNOSIS — M1611 Unilateral primary osteoarthritis, right hip: Secondary | ICD-10-CM | POA: Diagnosis not present

## 2018-03-19 DIAGNOSIS — E119 Type 2 diabetes mellitus without complications: Secondary | ICD-10-CM | POA: Diagnosis not present

## 2018-03-19 DIAGNOSIS — Z79899 Other long term (current) drug therapy: Secondary | ICD-10-CM | POA: Diagnosis not present

## 2018-03-19 DIAGNOSIS — Z7982 Long term (current) use of aspirin: Secondary | ICD-10-CM | POA: Insufficient documentation

## 2018-03-19 MED ORDER — HYDROCODONE-ACETAMINOPHEN 5-325 MG PO TABS: 0.5000 | ORAL_TABLET | Freq: Four times a day (QID) | ORAL | 0 refills | Status: DC | PRN

## 2018-03-19 NOTE — Discharge Instructions (Addendum)
Finish the prednisone that you were prescribed at the urgent care center.  I also recommend taking Tylenol 650 mg twice daily (arthritis strength).  You may use the medication prescribed if needed for increased pain, however use caution with this medication as it will make you drowsy.  Do not drive or perform any activities where you need to be wide-awake within 4 hours of taking this medication.

## 2018-03-19 NOTE — ED Triage Notes (Signed)
Pt c/o R thigh pain with no injury for 4 days. Was seen at urgent care this week and given prednisone with minimal relief.

## 2018-03-20 NOTE — ED Provider Notes (Addendum)
[facility] EMERGENCY DEPARTMENT Provider Note   CSN: [id number] Arrival date & time: 03/19/18  1417     History   Chief Complaint Chief Complaint  Patient presents with  . Leg Pain    HPI Jillian [name] is a 77 y.o. female with htn, well controlled DM and osteoarthritis presenting with a 4 day history of right upper medial thigh pain which is triggered by weight bearing, reporting no pain while sitting or supine and pain is not reproducible with palpation or movement of the leg.  She is unable to described the character of the pain. She denies injury, falls, overuse.  She was seen by an urgent care 3 days ago and is currently taking a pulse dose of prednisone which has not improved her pain. She denies fevers, chills, low back pain or rash and denies numbness or weakness in her extremities..  The history is provided by the patient.    Past Medical History:  Diagnosis Date  . Chronic fatigue   . Diabetes mellitus 2011  . DJD (degenerative joint disease)    of neck    . Dyslipidemia   . Hypertension 2005  . Mild obesity   . Postmenopausal   . Wears glasses     Patient Active Problem List   Diagnosis Date Noted  . Longitudinal split nail 02/15/2018  . Bursitis of deltoid, left 09/09/2017  . Chronic left SI joint pain 05/21/2016  . Primary osteoarthritis of both knees 06/26/2015  . 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 Thoracic spine pain 09/28/2014  . Hip pain 08/06/2012  . 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 Hot flashes, menopausal 09/10/2011  . 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 INSOMNIA, CHRONIC 11/28/2009  . Hyperlipidemia with target LDL less than 100 10/12/2007  . Obesity (BMI 30.0-34.9) 10/12/2007  . ESSENTIAL HYPERTENSION, BENIGN 10/12/2007    Past Surgical History:  Procedure Laterality Date  . ABDOMINAL HYSTERECTOMY  1992   fibroids  . APPENDECTOMY    . BREAST EXCISIONAL BIOPSY Right    benign  . BREAST LUMPECTOMY WITH RADIOACTIVE SEED LOCALIZATION Right 10/05/2014   Procedure: BREAST LUMPECTOMY WITH RADIOACTIVE SEED LOCALIZATION;  Surgeon: Erroll Luna, MD;  Location: [place];  Service: General;  Laterality: Right;  . cataract Bilateral   . CYST EXCISION Right    arm  . EYE SURGERY Right 06/07/2013   cataract  . EYE SURGERY Left 09/14/2013   cataract  . KNEE ARTHROSCOPY Right 02/26/2016   Procedure: ARTHROSCOPY RIGHT KNEE WITH MENSICAL DEBRIDEMENT;  Surgeon: Gaynelle Arabian, MD;  Location: [facility];  Service: Orthopedics;  Laterality: Right;  . left breast biopsy    . LESION REMOVAL Right 03/30/2013   Procedure: EXCISION OF NEOPLASM ARM;  Surgeon: Jamesetta So, MD;  Location: [facility];  Service: General;  Laterality: Right;  . TUBAL LIGATION       OB History    Gravida  2   Para  2   Term  2   Preterm      AB      Living        SAB      TAB      Ectopic      Multiple      Live Births               Home Medications    Prior to Admission medications   Medication Sig Start Date End Date  Taking? Authorizing Provider  acetaminophen (TYLENOL) 500 MG tablet Take 1,000 mg by mouth every 6 (six) hours as needed for mild pain.    [provider]  aspirin EC 81 MG tablet Take 1 tablet (81 mg total) by mouth every evening. Patient taking differently: Take 81 mg by mouth every morning.  04/23/16   Fayrene Helper, MD  benazepril (LOTENSIN) 10 MG tablet Take 1 tablet (10 mg total) by mouth daily. 10/13/17   Fayrene Helper, MD  cloNIDine (CATAPRES) 0.1 MG tablet Take 1 tablet (0.1 mg total) by mouth at bedtime. 10/13/17   Fayrene Helper, MD  HYDROcodone-acetaminophen (NORCO/VICODIN) 5-325 MG tablet Take 0.5-1 tablets by mouth every 6 (six) hours as needed for severe pain. 03/19/18   Evalee Jefferson, PA-C  ibuprofen (ADVIL,MOTRIN) 800 MG tablet Take 1 tablet (800 mg total) by mouth every 8 (eight) hours as needed. 09/09/17    Fayrene Helper, MD  metFORMIN (GLUCOPHAGE-XR) 500 MG 24 hr tablet Take 1 tablet (500 mg total) by mouth 2 (two) times daily. 09/09/17   Fayrene Helper, MD  naproxen (NAPROSYN) 500 MG tablet Take 500 mg by mouth 2 (two) times daily with a meal.    [provider]  pravastatin (PRAVACHOL) 10 MG tablet Take 10 mg by mouth daily.    [provider]  tamoxifen (NOLVADEX) 20 MG tablet Take 1 tablet (20 mg total) by mouth daily. 01/12/18   Holley Bouche, NP  temazepam (RESTORIL) 15 MG capsule Take 1 capsule (15 mg total) by mouth at bedtime as needed for sleep. 09/09/17   Fayrene Helper, MD  venlafaxine XR (EFFEXOR-XR) 37.5 MG 24 hr capsule Take 1 capsule (37.5 mg total) by mouth daily with breakfast. 09/09/17   Fayrene Helper, MD    Family History Family History  Problem Relation Age of Onset  . Hypertension Father   . Congestive Heart Failure Father   . Liver cancer Mother   . Hypertension Sister   . Hypertension Brother   . Diabetes Brother     Social History Social History   Tobacco Use  . Smoking status: Never Smoker  . Smokeless tobacco: Never Used  Substance Use Topics  . Alcohol use: No  . Drug use: No     Allergies   Codeine and Propoxyphene n-acetaminophen   Review of Systems Review of Systems  Constitutional: Negative for chills and fever.  Musculoskeletal: Positive for arthralgias and joint swelling. Negative for myalgias.  Skin: Negative.   Neurological: Negative for weakness and numbness.     Physical Exam Updated Vital Signs BP (!) 175/76 (BP Location: Right Arm)   Pulse 68   Temp 98.3 F (36.8 C) (Oral)   Resp 18   Ht 5\' 3"  (1.6 m)   Wt 85.7 kg (189 lb)   SpO2 98%   BMI 33.48 kg/m   Physical Exam  Constitutional: She appears well-developed and well-nourished. No distress.  HENT:  Head: Atraumatic.  Neck: Normal range of motion.  Cardiovascular: Normal rate.  Pulses:      Dorsalis pedis pulses are 2+ on  the right side, and 2+ on the left side.  Pulses equal bilaterally. No varicosities.  Pulmonary/Chest: Effort normal.  Musculoskeletal: She exhibits no tenderness.       Legs: Pain is at the right anterior/medial upper thigh. It is not reproducible with palpation or with hip passive and active flexion , abduction, adduction nor with internal/external hip rotation. No palpable induration, thigh  compartments soft. No rash.   Neurological: She is alert. She has normal strength. She displays normal reflexes. No sensory deficit. She exhibits normal muscle tone.  Skin: Skin is warm and dry. No rash noted. No erythema.  Psychiatric: She has a normal mood and affect.     ED Treatments / Results  Labs (all labs ordered are listed, but only abnormal results are displayed) Labs Reviewed - No data to display  EKG None  Radiology Dg Hip Unilat W Or W/o Pelvis 2-3 Views Right  Result Date: 03/19/2018 CLINICAL DATA:  Right hip pain. EXAM: DG HIP (WITH OR WITHOUT PELVIS) 2-3V RIGHT COMPARISON:  None. FINDINGS: Marked sclerosis at the symphysis pubis. Pelvic bony ring is intact. Right hip is located without a fracture. No significant joint space narrowing in either hip. Mild spurring and degenerative changes along the superolateral aspect of the right acetabulum. IMPRESSION: No acute abnormality to the pelvis or right hip. Sclerosis and degenerative changes at the symphysis pubis. Electronically Signed   By: Markus Daft M.D.   On: 03/19/2018 15:42    Procedures Procedures (including critical care time)  Medications Ordered in ED Medications - No data to display   Initial Impression / Assessment and Plan / ED Course  I have reviewed the triage vital signs and the nursing notes.  Pertinent labs & imaging results that were available during my care of the patient were reviewed by me and considered in my medical decision making (see chart for details).     Imaging reviewed and discussed,  slerosis/degenerative changes  at the symphysis pubis may be source of referred pain to upper thigh, but does not necessarily explain acute pain. This was discussed with pt. Still favor msk source given pain is triggered by weight bearing. No fracture or hx to suggest this. Advised to finish prednisone previously prescribed.  She was prescribed hydrocodone, advised caution re sedation. Pt states has taken in the past and has done ok with this med. Advised close f/u with pcp if sx persist. Heat tx.  [place] controlled substance database reviewed. . Pt discussed with Dr. Wilson Singer during ed visit. Final Clinical Impressions(s) / ED Diagnoses   Final diagnoses:  Osteoarthritis of right hip, unspecified osteoarthritis type    ED Discharge Orders        Ordered    HYDROcodone-acetaminophen (NORCO/VICODIN) 5-325 MG tablet  Every 6 hours PRN     03/19/18 1643       Evalee Jefferson, PA-C 03/20/18 0832    Evalee Jefferson, PA-C 03/20/18 0349    Virgel Manifold, MD 03/20/18 2357

## 2018-04-06 ENCOUNTER — Other Ambulatory Visit: Payer: Self-pay | Admitting: Family Medicine

## 2018-04-12 ENCOUNTER — Telehealth: Payer: Self-pay | Admitting: Family Medicine

## 2018-04-12 NOTE — Telephone Encounter (Signed)
Patient called in to let us know she has had a headache since last night on the right side of her head. She has taken tylenol & aleve with no relief. Please advise. Cb#:          [id number]/ [id number]

## 2018-04-13 NOTE — Telephone Encounter (Signed)
Spoke with the patient states she is now relieved on day 5 , took tylenol today, generally caffeine gives relief. p Denies numbness , weakness , vision change or sinus  Symptoms, no neck stiffness,  Advised to take retoril tonight , and that should be the end

## 2018-04-24 DIAGNOSIS — M79651 Pain in right thigh: Secondary | ICD-10-CM | POA: Diagnosis not present

## 2018-04-28 ENCOUNTER — Ambulatory Visit (INDEPENDENT_AMBULATORY_CARE_PROVIDER_SITE_OTHER): Payer: Medicare HMO | Admitting: Family Medicine

## 2018-04-28 ENCOUNTER — Encounter: Payer: Self-pay | Admitting: Family Medicine

## 2018-04-28 ENCOUNTER — Other Ambulatory Visit: Payer: Self-pay | Admitting: Family Medicine

## 2018-04-28 ENCOUNTER — Ambulatory Visit (HOSPITAL_COMMUNITY)
Admission: RE | Admit: 2018-04-28 | Discharge: 2018-04-28 | Disposition: A | Discharge status: Home / Self Care | Payer: Medicare HMO | Source: Ambulatory Visit | Attending: Family Medicine | Admitting: Family Medicine

## 2018-04-28 VITALS — BP 124/82 | HR 82 | Resp 16 | Ht 64.0 in | Wt 187.0 lb

## 2018-04-28 DIAGNOSIS — E669 Obesity, unspecified: Secondary | ICD-10-CM

## 2018-04-28 DIAGNOSIS — M4316 Spondylolisthesis, lumbar region: Secondary | ICD-10-CM | POA: Diagnosis not present

## 2018-04-28 DIAGNOSIS — I10 Essential (primary) hypertension: Secondary | ICD-10-CM | POA: Diagnosis not present

## 2018-04-28 DIAGNOSIS — M5410 Radiculopathy, site unspecified: Secondary | ICD-10-CM | POA: Insufficient documentation

## 2018-04-28 DIAGNOSIS — E785 Hyperlipidemia, unspecified: Secondary | ICD-10-CM

## 2018-04-28 DIAGNOSIS — M1288 Other specific arthropathies, not elsewhere classified, other specified site: Secondary | ICD-10-CM | POA: Insufficient documentation

## 2018-04-28 DIAGNOSIS — M5116 Intervertebral disc disorders with radiculopathy, lumbar region: Secondary | ICD-10-CM | POA: Diagnosis not present

## 2018-04-28 DIAGNOSIS — E559 Vitamin D deficiency, unspecified: Secondary | ICD-10-CM

## 2018-04-28 DIAGNOSIS — E1169 Type 2 diabetes mellitus with other specified complication: Secondary | ICD-10-CM | POA: Diagnosis not present

## 2018-04-28 DIAGNOSIS — E1121 Type 2 diabetes mellitus with diabetic nephropathy: Secondary | ICD-10-CM

## 2018-04-28 DIAGNOSIS — M545 Low back pain: Secondary | ICD-10-CM | POA: Diagnosis not present

## 2018-04-28 MED ORDER — KETOROLAC TROMETHAMINE 60 MG/2ML IM SOLN
60.0000 mg | Freq: Once | INTRAMUSCULAR | Status: AC
  Administered 2018-04-28: 60 mg via INTRAMUSCULAR

## 2018-04-28 MED ORDER — PREDNISONE 20 MG PO TABS: ORAL_TABLET | ORAL | 0 refills | Status: DC

## 2018-04-28 MED ORDER — METHYLPREDNISOLONE ACETATE 80 MG/ML IJ SUSP
120.0000 mg | Freq: Once | INTRAMUSCULAR | Status: AC
  Administered 2018-04-28: 120 mg via INTRAMUSCULAR

## 2018-04-28 MED ORDER — TEMAZEPAM 15 MG PO CAPS: 15.0000 mg | ORAL_CAPSULE | Freq: Every day | ORAL | 5 refills | Status: DC

## 2018-04-28 MED ORDER — IBUPROFEN 600 MG PO TABS: 600.0000 mg | ORAL_TABLET | Freq: Three times a day (TID) | ORAL | 0 refills | Status: DC

## 2018-04-28 MED ORDER — RANITIDINE HCL 300 MG PO TABS: 300.0000 mg | ORAL_TABLET | Freq: Every day | ORAL | 0 refills | Status: DC

## 2018-04-28 NOTE — Patient Instructions (Signed)
Keep appt for physical exam as before, call if you need me sooner  Please get X ray of your low back today  Pain is from your low back  Injections in the office today and 3 medications sent to [facility], careful not to fall   HBA1c, chem 7 and eGFR ad vit D 1 week before your December visit , please leave with lab order  Good foot exam today  I have sent a message to your cancer Doctor as you are aware with your concerns be sure to discuss at the vit , also call again for your medications refill

## 2018-04-28 NOTE — Assessment & Plan Note (Signed)
5 day history of pain rated at a 10, treated with flexeril at urgent care with flexeril, no improvement  Uncontrolled.Toradol and depo medrol administered IM in the office , to be followed by a short course of oral prednisone and NSAIDS.

## 2018-04-29 ENCOUNTER — Encounter: Payer: Self-pay | Admitting: Family Medicine

## 2018-04-30 ENCOUNTER — Other Ambulatory Visit (HOSPITAL_COMMUNITY): Payer: Self-pay

## 2018-04-30 ENCOUNTER — Encounter (HOSPITAL_COMMUNITY): Payer: Self-pay

## 2018-04-30 DIAGNOSIS — N6091 Unspecified benign mammary dysplasia of right breast: Secondary | ICD-10-CM

## 2018-04-30 MED ORDER — TAMOXIFEN CITRATE 20 MG PO TABS: 20.0000 mg | ORAL_TABLET | Freq: Every day | ORAL | 4 refills | Status: DC

## 2018-04-30 NOTE — Telephone Encounter (Signed)
Dr. Walden Field received communication from patients PCP that she was questioning why she was taking the tamoxifen. Attempted to call patient but was unable to reach her or leave a message. Sent email to patient via [place]. Also refilled tamoxifen per Dr. Walden Field order.

## 2018-05-03 ENCOUNTER — Telehealth: Payer: Self-pay | Admitting: Family Medicine

## 2018-05-03 NOTE — Telephone Encounter (Signed)
Patient left message requesting a call back to [phone] I returned her call & the person whom answered the phone stated patient was unavailable but she would leave her a message.

## 2018-05-04 ENCOUNTER — Other Ambulatory Visit: Payer: Self-pay

## 2018-05-04 MED ORDER — GLUCOSE BLOOD VI STRP: ORAL_STRIP | 5 refills | Status: DC

## 2018-05-04 MED ORDER — BLOOD GLUCOSE METER KIT
PACK | 0 refills | Status: DC
Start: 2018-05-04 — End: 2018-06-14

## 2018-05-04 NOTE — Telephone Encounter (Signed)
Jillian [name] is requesting diabetic testing strips. I don't see ay on her list. Do you want me to send in for once daily or should she not be testing? Please advise

## 2018-05-04 NOTE — Telephone Encounter (Signed)
Yes once daily, she takes metformin , thanks

## 2018-05-04 NOTE — Telephone Encounter (Signed)
Done

## 2018-05-06 ENCOUNTER — Other Ambulatory Visit: Payer: Self-pay | Admitting: Family Medicine

## 2018-05-06 ENCOUNTER — Encounter: Payer: Self-pay | Admitting: Family Medicine

## 2018-05-06 ENCOUNTER — Telehealth: Payer: Self-pay | Admitting: Family Medicine

## 2018-05-06 DIAGNOSIS — E669 Obesity, unspecified: Secondary | ICD-10-CM

## 2018-05-06 DIAGNOSIS — M549 Dorsalgia, unspecified: Secondary | ICD-10-CM

## 2018-05-06 DIAGNOSIS — E1169 Type 2 diabetes mellitus with other specified complication: Secondary | ICD-10-CM | POA: Insufficient documentation

## 2018-05-06 MED ORDER — PREDNISONE 20 MG PO TABS: ORAL_TABLET | ORAL | 0 refills | Status: DC

## 2018-05-06 NOTE — Assessment & Plan Note (Signed)
Jillian [name] is reminded of the importance of commitment to daily physical activity for 30 minutes or more, as able and the need to limit carbohydrate intake to 30 to 60 grams per meal to help with blood sugar control.   The need to take medication as prescribed, test blood sugar as directed, and to call between visits if there is a concern that blood sugar is uncontrolled is also discussed.   Jillian [name] is reminded of the importance of daily foot exam, annual eye examination, and good blood sugar, blood pressure and cholesterol control. Controlled, no change in medication   Diabetic Labs Latest Ref Rng & Units 02/24/2018 11/13/2017 09/09/2017 05/20/2017 05/12/2017  HbA1c <5.7 % of total Hgb 6.3(H) - 6.3(H) - -  Microalbumin Not Estab. ug/mL - - - 3.6(H) -  Micro/Creat Ratio 0.0 - 30.0 mg/g creat - - - 1.7 -  Chol <200 mg/dL 119 - - - -  HDL >50 mg/dL 47(L) - - - -  Calc LDL mg/dL (calc) 56 - - - -  Triglycerides <150 mg/dL 81 - - - -  Creatinine 0.60 - 0.93 mg/dL 0.98(H) 0.92 0.99(H) - 0.96   BP/Weight 04/28/2018 03/19/2018 02/16/2018 02/09/2018 01/11/2018 11/13/2017 86/48/4720  Systolic BP 721 828 833 744 514 604 799  Diastolic BP 82 76 60 82 70 74 80  Wt. (Lbs) 187 189 188.7 187 188.25 185 185  BMI 32.1 33.48 32.39 32.1 32.31 31.76 31.76   Foot/eye exam completion dates Latest Ref Rng & Units 04/28/2018 09/09/2017  Eye Exam No Retinopathy - -  Foot exam Order - - -  Foot Form Completion - Done Done

## 2018-05-06 NOTE — Progress Notes (Signed)
pred 

## 2018-05-06 NOTE — Assessment & Plan Note (Signed)
unchanged. Patient re-educated about  the importance of commitment to a  minimum of 150 minutes of exercise per week.  The importance of healthy food choices with portion control discussed. Encouraged to start a food diary, count calories and to consider  joining a support group. Sample diet sheets offered. Goals set by the patient for the next several months.   Weight /BMI 04/28/2018 03/19/2018 02/16/2018  WEIGHT 187 lb 189 lb 188 lb 11.2 oz  HEIGHT 5\' 4"  5\' 3"  5\' 4"   BMI 32.1 kg/m2 33.48 kg/m2 32.39 kg/m2

## 2018-05-06 NOTE — Telephone Encounter (Signed)
Pt is calling complaining that her legs are worse--since she received the shot.. Please call the pt

## 2018-05-06 NOTE — Progress Notes (Signed)
TIMOTHY TOWNSEL     MRN: [id number]      DOB: 08-Oct-1940   HPI [name] is here for follow up of urgent care visit this past weekend for acute low back pain radiating to the  Groin. She was diagnosed with muscle strain, givemn muscle relaxants and pain has not improved. Has to use a walker for safe ambulation which is new for her. No specific aggravating injury to start her symptoms. She denies lower extremity weakness or numbness or incontinence ROS Denies recent fever or chills. Denies sinus pressure, nasal congestion, ear pain or sore throat. Denies chest congestion, productive cough or wheezing. Denies chest pains, palpitations and leg swelling Denies abdominal pain, nausea, vomiting,diarrhea or constipation.   Denies dysuria, frequency, hesitancy or incontinence.  Denies headaches, seizures, numbness, or tingling. Denies depression, anxiety or insomnia. Denies skin break down or rash.   PE  BP 124/82   Pulse 82   Resp 16   Ht 5\' 4"  (1.626 m)   Wt 187 lb (84.8 kg)   SpO2 96%   BMI 32.10 kg/m   Patient alert and oriented and in no cardiopulmonary distress.Pt in severe pain  HEENT: No facial asymmetry, EOMI,   oropharynx pink and moist.  Neck supple no JVD, no mass.  Chest: Clear to auscultation bilaterally.  CVS: S1, S2 no murmurs, no S3.Regular rate.  ABD: Soft non tender.   Ext: No edema  MS: decreased  ROM lumbar spine, adequate in  shoulders, hips and knees.  Skin: Intact, no ulcerations or rash noted.  Psych: Good eye contact, normal affect. Memory intact not anxious or depressed appearing.  CNS: CN 2-12 intact, power,  normal throughout.no focal deficits noted.   Assessment & Plan  Back pain with right-sided radiculopathy 5 day history of pain rated at a 10, treated with flexeril at urgent care with flexeril, no improvement  Uncontrolled.Toradol and depo medrol administered IM in the office , to be followed by a short course of oral prednisone  and NSAIDS.   Obesity (BMI 30.0-34.9) unchanged. Patient re-educated about  the importance of commitment to a  minimum of 150 minutes of exercise per week.  The importance of healthy food choices with portion control discussed. Encouraged to start a food diary, count calories and to consider  joining a support group. Sample diet sheets offered. Goals set by the patient for the next several months.   Weight /BMI 04/28/2018 03/19/2018 02/16/2018  WEIGHT 187 lb 189 lb 188 lb 11.2 oz  HEIGHT 5\' 4"  5\' 3"  5\' 4"   BMI 32.1 kg/m2 33.48 kg/m2 32.39 kg/m2      Diabetes mellitus type 2 in obese [facility]) Ms. Hegner is reminded of the importance of commitment to daily physical activity for 30 minutes or more, as able and the need to limit carbohydrate intake to 30 to 60 grams per meal to help with blood sugar control.   The need to take medication as prescribed, test blood sugar as directed, and to call between visits if there is a concern that blood sugar is uncontrolled is also discussed.   Ms. Dredge is reminded of the importance of daily foot exam, annual eye examination, and good blood sugar, blood pressure and cholesterol control. Controlled, no change in medication   Diabetic Labs Latest Ref Rng & Units 02/24/2018 11/13/2017 09/09/2017 05/20/2017 05/12/2017  HbA1c <5.7 % of total Hgb 6.3(H) - 6.3(H) - -  Microalbumin Not Estab. ug/mL - - - 3.6(H) -  Micro/Creat Ratio  0.0 - 30.0 mg/g creat - - - 1.7 -  Chol <200 mg/dL 119 - - - -  HDL >50 mg/dL 47(L) - - - -  Calc LDL mg/dL (calc) 56 - - - -  Triglycerides <150 mg/dL 81 - - - -  Creatinine 0.60 - 0.93 mg/dL 0.98(H) 0.92 0.99(H) - 0.96   BP/Weight 04/28/2018 03/19/2018 02/16/2018 02/09/2018 01/11/2018 11/13/2017 94/37/0052  Systolic BP 591 028 902 284 069 861 483  Diastolic BP 82 76 60 82 70 74 80  Wt. (Lbs) 187 189 188.7 187 188.25 185 185  BMI 32.1 33.48 32.39 32.1 32.31 31.76 31.76   Foot/eye exam completion dates Latest Ref Rng & Units  04/28/2018 09/09/2017  Eye Exam No Retinopathy - -  Foot exam Order - - -  Foot Form Completion - Done Done

## 2018-05-06 NOTE — Telephone Encounter (Signed)
C/o worse pain following recent office visit, 2 days ago when she received anti inflammatory injectoions for sciatica States when lying  no pain ,  When sitting no pain, but when attempts to stand pain occurring in both legs right worse rated at a 10 down inner thigh, prior to this was an 8, states got a little better then worse, no excess lifting , pulling or pushing and has not fallen Denies incontinence, no change in power or sensation noted States no more prednisone will check on this and she needs an mri

## 2018-05-06 NOTE — Telephone Encounter (Signed)
MRI is ordered of lumbar spine pls schedule asap and let her know  I have sent in additional prednisone and she is aware

## 2018-05-07 ENCOUNTER — Ambulatory Visit (HOSPITAL_COMMUNITY)
Admission: RE | Admit: 2018-05-07 | Discharge: 2018-05-07 | Disposition: A | Discharge status: Home / Self Care | Payer: Medicare HMO | Source: Ambulatory Visit | Attending: Family Medicine | Admitting: Family Medicine

## 2018-05-07 DIAGNOSIS — M549 Dorsalgia, unspecified: Secondary | ICD-10-CM | POA: Insufficient documentation

## 2018-05-07 DIAGNOSIS — M5126 Other intervertebral disc displacement, lumbar region: Secondary | ICD-10-CM | POA: Diagnosis not present

## 2018-05-07 DIAGNOSIS — M48061 Spinal stenosis, lumbar region without neurogenic claudication: Secondary | ICD-10-CM | POA: Diagnosis not present

## 2018-05-07 NOTE — Telephone Encounter (Signed)
Done and she is aware

## 2018-05-10 ENCOUNTER — Telehealth: Payer: Self-pay | Admitting: Family Medicine

## 2018-05-10 ENCOUNTER — Encounter: Payer: Self-pay | Admitting: Family Medicine

## 2018-05-10 NOTE — Telephone Encounter (Signed)
Please let her know that I have reviewed the report, and if she is still having A LOT of pain I recommend and will refer her to orthopedics,  But overall good report so hopefully the medication will be sufficient. Pls read the result note to her also, I is now documented

## 2018-05-10 NOTE — Telephone Encounter (Signed)
Patient calling in to let Dr.Simpson know she had her MRI.

## 2018-05-11 NOTE — Telephone Encounter (Signed)
Pt aware- feeling a lot better

## 2018-05-13 ENCOUNTER — Ambulatory Visit (HOSPITAL_COMMUNITY): Payer: Medicare HMO

## 2018-05-13 ENCOUNTER — Ambulatory Visit (HOSPITAL_COMMUNITY): Payer: Medicare HMO | Admitting: Hematology

## 2018-05-13 ENCOUNTER — Other Ambulatory Visit (HOSPITAL_COMMUNITY): Payer: Medicare HMO

## 2018-05-16 ENCOUNTER — Observation Stay (HOSPITAL_COMMUNITY)
Admission: EM | Admit: 2018-05-16 | Discharge: 2018-05-17 | Disposition: A | Discharge status: Home / Self Care | Payer: Medicare HMO | Attending: Family Medicine | Admitting: Family Medicine

## 2018-05-16 ENCOUNTER — Emergency Department (HOSPITAL_COMMUNITY): Payer: Medicare HMO

## 2018-05-16 ENCOUNTER — Other Ambulatory Visit: Payer: Self-pay

## 2018-05-16 ENCOUNTER — Encounter (HOSPITAL_COMMUNITY): Payer: Self-pay | Admitting: Emergency Medicine

## 2018-05-16 DIAGNOSIS — I10 Essential (primary) hypertension: Secondary | ICD-10-CM | POA: Diagnosis not present

## 2018-05-16 DIAGNOSIS — R531 Weakness: Secondary | ICD-10-CM | POA: Diagnosis not present

## 2018-05-16 DIAGNOSIS — R270 Ataxia, unspecified: Secondary | ICD-10-CM

## 2018-05-16 DIAGNOSIS — Z7982 Long term (current) use of aspirin: Secondary | ICD-10-CM | POA: Diagnosis not present

## 2018-05-16 DIAGNOSIS — R51 Headache: Secondary | ICD-10-CM | POA: Diagnosis not present

## 2018-05-16 DIAGNOSIS — R42 Dizziness and giddiness: Principal | ICD-10-CM

## 2018-05-16 DIAGNOSIS — Z79899 Other long term (current) drug therapy: Secondary | ICD-10-CM | POA: Insufficient documentation

## 2018-05-16 DIAGNOSIS — E119 Type 2 diabetes mellitus without complications: Secondary | ICD-10-CM | POA: Insufficient documentation

## 2018-05-16 DIAGNOSIS — R6883 Chills (without fever): Secondary | ICD-10-CM | POA: Diagnosis not present

## 2018-05-16 LAB — URINALYSIS, ROUTINE W REFLEX MICROSCOPIC
Bilirubin Urine: NEGATIVE
Glucose, UA: NEGATIVE mg/dL
Ketones, ur: 5 mg/dL — AB
Leukocytes, UA: NEGATIVE
Nitrite: NEGATIVE
Protein, ur: NEGATIVE mg/dL
Specific Gravity, Urine: 1.02 (ref 1.005–1.030)
pH: 6 (ref 5.0–8.0)

## 2018-05-16 LAB — CBC WITH DIFFERENTIAL/PLATELET
BASOS PCT: 0 %
Basophils Absolute: 0 10*3/uL (ref 0.0–0.1)
EOS PCT: 0 %
Eosinophils Absolute: 0 10*3/uL (ref 0.0–0.7)
HEMATOCRIT: 42.9 % (ref 36.0–46.0)
Hemoglobin: 14 g/dL (ref 12.0–15.0)
Lymphocytes Relative: 16 %
Lymphs Abs: 2.1 10*3/uL (ref 0.7–4.0)
MCH: 30.9 pg (ref 26.0–34.0)
MCHC: 32.6 g/dL (ref 30.0–36.0)
MCV: 94.7 fL (ref 78.0–100.0)
MONO ABS: 2.2 10*3/uL — AB (ref 0.1–1.0)
MONOS PCT: 17 %
NEUTROS ABS: 8.9 10*3/uL — AB (ref 1.7–7.7)
Neutrophils Relative %: 67 %
Platelets: 135 10*3/uL — ABNORMAL LOW (ref 150–400)
RBC: 4.53 MIL/uL (ref 3.87–5.11)
RDW: 14.3 % (ref 11.5–15.5)
WBC: 13.2 10*3/uL — ABNORMAL HIGH (ref 4.0–10.5)

## 2018-05-16 LAB — HEPATIC FUNCTION PANEL
ALBUMIN: 3.7 g/dL (ref 3.5–5.0)
ALK PHOS: 48 U/L (ref 38–126)
ALT: 17 U/L (ref 0–44)
AST: 16 U/L (ref 15–41)
Bilirubin, Direct: 0.2 mg/dL (ref 0.0–0.2)
Indirect Bilirubin: 0.9 mg/dL (ref 0.3–0.9)
TOTAL PROTEIN: 7.1 g/dL (ref 6.5–8.1)
Total Bilirubin: 1.1 mg/dL (ref 0.3–1.2)

## 2018-05-16 LAB — BASIC METABOLIC PANEL
ANION GAP: 9 (ref 5–15)
BUN: 15 mg/dL (ref 8–23)
CALCIUM: 8.8 mg/dL — AB (ref 8.9–10.3)
CO2: 27 mmol/L (ref 22–32)
Chloride: 95 mmol/L — ABNORMAL LOW (ref 98–111)
Creatinine, Ser: 0.92 mg/dL (ref 0.44–1.00)
GFR calc Af Amer: >60 mL/min (ref >60)
GFR calc non Af Amer: 59 mL/min — ABNORMAL LOW (ref >60)
GLUCOSE: 122 mg/dL — AB (ref 70–99)
Potassium: 4.1 mmol/L (ref 3.5–5.1)
Sodium: 131 mmol/L — ABNORMAL LOW (ref 135–145)

## 2018-05-16 LAB — D-DIMER, QUANTITATIVE: D-Dimer, Quant: 1.23 ug/mL-FEU — ABNORMAL HIGH (ref 0.00–0.50)

## 2018-05-16 LAB — LACTIC ACID, PLASMA
Lactic Acid, Venous: 2.7 mmol/L (ref 0.5–1.9)
Lactic Acid, Venous: 2.1 mmol/L (ref 0.5–1.9)

## 2018-05-16 LAB — CBG MONITORING, ED: Glucose-Capillary: 144 mg/dL — ABNORMAL HIGH (ref 70–99)

## 2018-05-16 LAB — TROPONIN I: Troponin I: <0.03 ng/mL (ref <0.03)

## 2018-05-16 MED ORDER — HYDROMORPHONE HCL 1 MG/ML IJ SOLN
0.5000 mg | Freq: Once | INTRAMUSCULAR | Status: AC
  Administered 2018-05-16: 0.5 mg via INTRAVENOUS
  Filled 2018-05-16: qty 1

## 2018-05-16 MED ORDER — SODIUM CHLORIDE 0.9 % IV BOLUS
500.0000 mL | Freq: Once | INTRAVENOUS | Status: AC
  Administered 2018-05-16: 500 mL via INTRAVENOUS

## 2018-05-16 MED ORDER — ONDANSETRON HCL 4 MG/2ML IJ SOLN
4.0000 mg | Freq: Once | INTRAMUSCULAR | Status: AC
  Administered 2018-05-16: 4 mg via INTRAVENOUS
  Filled 2018-05-16: qty 2

## 2018-05-16 MED ORDER — SODIUM CHLORIDE 0.9 % IV SOLN
INTRAVENOUS | Status: DC
  Administered 2018-05-16: 19:00:00 via INTRAVENOUS

## 2018-05-16 NOTE — ED Notes (Signed)
CRITICAL VALUE ALERT  Critical Value:  Lactic 2.1  Date & Time Notied: 05/16/18 1945  Provider Notified: EDP Zammit   Orders Received/Actions taken: No orders at this time

## 2018-05-16 NOTE — ED Notes (Signed)
CRITICAL VALUE ALERT  Critical Value:  Lactic acid  Date & Time Notified:  05/16/18 1751  Provider Notified: Wallie Char  Orders Received/Actions taken: -

## 2018-05-16 NOTE — ED Notes (Signed)
Pt has been stuck multiple times by multiple people for IV needed for CT Angio. Unable to obtain this access. House Supervisor Malachi Pro notified and asked to try to start IV. Dr Rogene Houston made aware.

## 2018-05-16 NOTE — ED Notes (Signed)
Pt ambulated from BR. Gait unsteady and required SBA x1. Pt vomited small amt of water while in BR

## 2018-05-16 NOTE — ED Triage Notes (Signed)
Pt states she has had a HA since Thursday, tylenol at home with no relief. Has N/V/, no photophobia. V/ X3 today.

## 2018-05-16 NOTE — ED Triage Notes (Signed)
Pt in NAD waiting for room. Family waiting with pt. Pt requesting something to drink. Informed of wait until seen by MD. Pt states nausea is better.

## 2018-05-16 NOTE — ED Notes (Signed)
Pt transported to xray 

## 2018-05-17 ENCOUNTER — Observation Stay (HOSPITAL_COMMUNITY): Payer: Medicare HMO

## 2018-05-17 ENCOUNTER — Encounter (HOSPITAL_COMMUNITY): Payer: Self-pay | Admitting: Family Medicine

## 2018-05-17 DIAGNOSIS — R42 Dizziness and giddiness: Secondary | ICD-10-CM

## 2018-05-17 DIAGNOSIS — R270 Ataxia, unspecified: Secondary | ICD-10-CM | POA: Diagnosis not present

## 2018-05-17 DIAGNOSIS — R7989 Other specified abnormal findings of blood chemistry: Secondary | ICD-10-CM | POA: Diagnosis not present

## 2018-05-17 DIAGNOSIS — I10 Essential (primary) hypertension: Secondary | ICD-10-CM

## 2018-05-17 DIAGNOSIS — R410 Disorientation, unspecified: Secondary | ICD-10-CM

## 2018-05-17 DIAGNOSIS — R739 Hyperglycemia, unspecified: Secondary | ICD-10-CM

## 2018-05-17 LAB — CBC
HCT: 39.7 % (ref 36.0–46.0)
Hemoglobin: 12.9 g/dL (ref 12.0–15.0)
MCH: 30.9 pg (ref 26.0–34.0)
MCHC: 32.5 g/dL (ref 30.0–36.0)
MCV: 95 fL (ref 78.0–100.0)
PLATELETS: 111 10*3/uL — AB (ref 150–400)
RBC: 4.18 MIL/uL (ref 3.87–5.11)
RDW: 14.2 % (ref 11.5–15.5)
WBC: 12 10*3/uL — ABNORMAL HIGH (ref 4.0–10.5)

## 2018-05-17 LAB — COMPREHENSIVE METABOLIC PANEL
ALBUMIN: 3.1 g/dL — AB (ref 3.5–5.0)
ALK PHOS: 41 U/L (ref 38–126)
ALT: 15 U/L (ref 0–44)
ANION GAP: 9 (ref 5–15)
AST: 15 U/L (ref 15–41)
BUN: 11 mg/dL (ref 8–23)
CALCIUM: 8.4 mg/dL — AB (ref 8.9–10.3)
CHLORIDE: 99 mmol/L (ref 98–111)
CO2: 25 mmol/L (ref 22–32)
Creatinine, Ser: 0.87 mg/dL (ref 0.44–1.00)
GFR calc Af Amer: >60 mL/min (ref >60)
GFR calc non Af Amer: >60 mL/min (ref >60)
GLUCOSE: 182 mg/dL — AB (ref 70–99)
Potassium: 4.1 mmol/L (ref 3.5–5.1)
SODIUM: 133 mmol/L — AB (ref 135–145)
Total Bilirubin: 0.9 mg/dL (ref 0.3–1.2)
Total Protein: 6.5 g/dL (ref 6.5–8.1)

## 2018-05-17 LAB — GLUCOSE, CAPILLARY
GLUCOSE-CAPILLARY: 170 mg/dL — AB (ref 70–99)
GLUCOSE-CAPILLARY: 143 mg/dL — AB (ref 70–99)

## 2018-05-17 LAB — HEMOGLOBIN A1C
Hgb A1c MFr Bld: 6.6 % — ABNORMAL HIGH (ref 4.8–5.6)
Mean Plasma Glucose: 142.72 mg/dL

## 2018-05-17 LAB — LACTIC ACID, PLASMA: Lactic Acid, Venous: 1.5 mmol/L (ref 0.5–1.9)

## 2018-05-17 MED ORDER — ACETAMINOPHEN 500 MG PO TABS
1000.0000 mg | ORAL_TABLET | Freq: Four times a day (QID) | ORAL | Status: DC | PRN
  Administered 2018-05-17: 1000 mg via ORAL
  Filled 2018-05-17: qty 2

## 2018-05-17 MED ORDER — ENOXAPARIN SODIUM 40 MG/0.4ML ~~LOC~~ SOLN
40.0000 mg | SUBCUTANEOUS | Status: DC
  Administered 2018-05-17: 40 mg via SUBCUTANEOUS
  Filled 2018-05-17: qty 0.4

## 2018-05-17 MED ORDER — VENLAFAXINE HCL ER 37.5 MG PO CP24
37.5000 mg | ORAL_CAPSULE | Freq: Every day | ORAL | Status: DC
  Administered 2018-05-17: 37.5 mg via ORAL
  Filled 2018-05-17 (×3): qty 1

## 2018-05-17 MED ORDER — HYDRALAZINE HCL 20 MG/ML IJ SOLN: 10.0000 mg | INTRAMUSCULAR | Status: DC | PRN

## 2018-05-17 MED ORDER — BENAZEPRIL HCL 10 MG PO TABS
10.0000 mg | ORAL_TABLET | Freq: Every day | ORAL | Status: DC
  Administered 2018-05-17: 10 mg via ORAL
  Filled 2018-05-17: qty 1

## 2018-05-17 MED ORDER — ORAL CARE MOUTH RINSE
15.0000 mL | Freq: Two times a day (BID) | OROMUCOSAL | Status: DC
  Administered 2018-05-17: 15 mL via OROMUCOSAL

## 2018-05-17 MED ORDER — CLONIDINE HCL 0.1 MG PO TABS: 0.1000 mg | ORAL_TABLET | Freq: Every day | ORAL | Status: DC

## 2018-05-17 MED ORDER — TAMOXIFEN CITRATE 10 MG PO TABS
20.0000 mg | ORAL_TABLET | Freq: Every day | ORAL | Status: DC
  Filled 2018-05-17 (×4): qty 2

## 2018-05-17 MED ORDER — BENAZEPRIL HCL 20 MG PO TABS: 20.0000 mg | ORAL_TABLET | Freq: Every day | ORAL | 0 refills | Status: DC

## 2018-05-17 MED ORDER — ASPIRIN EC 81 MG PO TBEC
81.0000 mg | DELAYED_RELEASE_TABLET | Freq: Every morning | ORAL | Status: DC
  Administered 2018-05-17: 81 mg via ORAL
  Filled 2018-05-17: qty 1

## 2018-05-17 MED ORDER — TECHNETIUM TC 99M DIETHYLENETRIAME-PENTAACETIC ACID
31.0000 | Freq: Once | INTRAVENOUS | Status: AC | PRN
  Administered 2018-05-17: 31 via RESPIRATORY_TRACT

## 2018-05-17 MED ORDER — PRAVASTATIN SODIUM 10 MG PO TABS
10.0000 mg | ORAL_TABLET | Freq: Every day | ORAL | Status: DC
  Administered 2018-05-17: 10 mg via ORAL
  Filled 2018-05-17: qty 1

## 2018-05-17 MED ORDER — SODIUM CHLORIDE 0.9 % IV SOLN
INTRAVENOUS | Status: DC
  Administered 2018-05-17: 04:00:00 via INTRAVENOUS

## 2018-05-17 MED ORDER — CLONIDINE HCL 0.1 MG PO TABS: 0.1000 mg | ORAL_TABLET | Freq: Two times a day (BID) | ORAL | 1 refills | Status: DC

## 2018-05-17 MED ORDER — TECHNETIUM TO 99M ALBUMIN AGGREGATED
4.5000 | Freq: Once | INTRAVENOUS | Status: AC | PRN
  Administered 2018-05-17: 4.5 via INTRAVENOUS

## 2018-05-17 MED ORDER — INSULIN ASPART 100 UNIT/ML ~~LOC~~ SOLN
0.0000 [IU] | Freq: Three times a day (TID) | SUBCUTANEOUS | Status: DC
  Administered 2018-05-17 (×2): 2 [IU] via SUBCUTANEOUS

## 2018-05-17 NOTE — H&P (Signed)
                                                                            [facility] H&P    Patient Demographics:    Jillian [name], is a 77 y.o. female  MRN: [id number]  DOB - 03/29/1941  Admit Date - 05/16/2018  Referring MD/NP/PA: Dr. Zukowski  Outpatient Primary MD for the patient is Simpson, Margaret E, MD  Patient coming from: Home  Chief complaint-headache   HPI:    Jillian [name]  is a 77 y.o. female, with history of chronic back pain, diabetes mellitus, hypertension, dyslipidemia who came to hospital with complaints of headache, mild confusion which has now resolved.  Apparently patient has low back pain and was seen by PCP Dr. Simpson she was given an anti-inflammatory injection along with prednisone.  Patient also took Flexeril over the past few days.  As per family patient became somnolent with a limp and was confused yesterday and today complained of headache so they brought her to the hospital.  In the ED there was concern for gait disturbance, patient denies back pain at this time. As per family patient has been not thinking clearly. No slurred speech, no blurred vision. No focal weakness of extremities No bladder incontinence No nausea vomiting or diarrhea CT head was negative in the ED. Patient also had elevated d-dimer, 1.23.  CTA chest has been ordered.    Review of systems:     All other systems reviewed and are negative.   With Past History of the following :    Past Medical History:  Diagnosis Date  . Chronic fatigue   . Diabetes mellitus 2011  . DJD (degenerative joint disease)    of neck    . Dyslipidemia   . Hypertension 2005  . Mild obesity   . Postmenopausal   . Wears glasses       Past Surgical History:  Procedure Laterality Date  . ABDOMINAL HYSTERECTOMY  1992   fibroids  . APPENDECTOMY    . BREAST EXCISIONAL BIOPSY Right    benign  . BREAST LUMPECTOMY WITH RADIOACTIVE SEED LOCALIZATION Right 10/05/2014   Procedure: BREAST LUMPECTOMY  WITH RADIOACTIVE SEED LOCALIZATION;  Surgeon: Thomas Cornett, MD;  Location: [facility];  Service: General;  Laterality: Right;  . cataract Bilateral   . CYST EXCISION Right    arm  . EYE SURGERY Right 06/07/2013   cataract  . EYE SURGERY Left 09/14/2013   cataract  . KNEE ARTHROSCOPY Right 02/26/2016   Procedure: ARTHROSCOPY RIGHT KNEE WITH MENSICAL DEBRIDEMENT;  Surgeon: Frank Aluisio, MD;  Location: [facility];  Service: Orthopedics;  Laterality: Right;  . left breast biopsy    . LESION REMOVAL Right 03/30/2013   Procedure: EXCISION OF NEOPLASM ARM;  Surgeon: Mark A Jenkins, MD;  Location: [facility];  Service: General;  Laterality: Right;  . TUBAL LIGATION        Social History:      Social History   Tobacco Use  . Smoking status: Never Smoker  . Smokeless tobacco: Never Used  Substance Use Topics  . Alcohol use: No       Family History :       Family History  Problem Relation Age of Onset  . Hypertension Father   . Congestive Heart Failure Father   . Liver cancer Mother   . Hypertension Sister   . Hypertension Brother   . Diabetes Brother       Home Medications:   Prior to Admission medications   Medication Sig Start Date End Date Taking? Authorizing Provider  acetaminophen (TYLENOL) 500 MG tablet Take 1,000 mg by mouth every 6 (six) hours as needed for mild pain.   Yes [provider]  benazepril (LOTENSIN) 10 MG tablet Take 1 tablet (10 mg total) by mouth daily. 10/13/17  Yes Fayrene Helper, MD  blood glucose meter kit and supplies Once daily testing- dispense formulary meter and supplies dx e11.9 05/04/18  Yes Fayrene Helper, MD  cloNIDine (CATAPRES) 0.1 MG tablet Take 1 tablet (0.1 mg total) by mouth at bedtime. 10/13/17  Yes Fayrene Helper, MD  glucose blood test strip Use as instructed once daily for one touch meter dx e11.9 05/04/18  Yes Fayrene Helper, MD  ibuprofen (ADVIL,MOTRIN) 600 MG tablet Take 1 tablet (600 mg total)  by mouth 3 (three) times daily. 04/28/18  Yes Fayrene Helper, MD  metFORMIN (GLUCOPHAGE-XR) 500 MG 24 hr tablet Take 1 tablet (500 mg total) by mouth 2 (two) times daily. 09/09/17  Yes Fayrene Helper, MD  pravastatin (PRAVACHOL) 10 MG tablet Take 10 mg by mouth daily.   Yes [provider]  ranitidine (ZANTAC) 300 MG tablet TAKE 1 TABLET(300 MG) BY MOUTH AT BEDTIME 04/28/18  Yes Fayrene Helper, MD  tamoxifen (NOLVADEX) 20 MG tablet Take 1 tablet (20 mg total) by mouth daily. 04/30/18  Yes Higgs, Mathis Dad, MD  temazepam (RESTORIL) 15 MG capsule Take 1 capsule (15 mg total) by mouth at bedtime. 04/28/18  Yes Fayrene Helper, MD  venlafaxine XR (EFFEXOR-XR) 37.5 MG 24 hr capsule TAKE 1 CAPSULE (37.5 MG TOTAL) BY MOUTH DAILY WITH BREAKFAST. 04/07/18  Yes Fayrene Helper, MD  aspirin EC 81 MG tablet Take 1 tablet (81 mg total) by mouth every evening. Patient taking differently: Take 81 mg by mouth every morning.  04/23/16   Fayrene Helper, MD  predniSONE (DELTASONE) 20 MG tablet One tablet 3 times daily for 3 days, then one tablet 2 times daily for 3 days, then one tablet daily for 3 days Patient not taking: Reported on 05/16/2018 05/06/18   Fayrene Helper, MD     Allergies:     Allergies  Allergen Reactions  . Codeine Nausea And Vomiting  . Propoxyphene N-Acetaminophen Other (See Comments)    hallucinations     Physical Exam:   Vitals  Blood pressure (!) 154/91, pulse 96, temperature 99.3 F (37.4 C), temperature source Oral, resp. rate (!) 22, height 5' 4" (1.626 m), weight 84.8 kg, SpO2 95 %.  1.  General: Appears in no acute distress  2. Psychiatric:  Intact judgement and  insight, awake alert, oriented x 3.  3. Neurologic: No focal neurological deficits, all cranial nerves intact.Strength 5/5 all 4 extremities, sensation intact all 4 extremities, plantars down going.  4. Eyes :  anicteric sclerae, moist conjunctivae with no lid lag. PERRLA.  5.  ENMT:  Oropharynx clear with moist mucous membranes and good dentition  6. Neck:  supple, no cervical lymphadenopathy appriciated, No thyromegaly  7. Respiratory : Normal respiratory effort, good air movement bilaterally,clear to  auscultation bilaterally  8. Cardiovascular : RRR, no gallops, rubs  or murmurs, no leg edema  9. Gastrointestinal:  Positive bowel sounds, abdomen soft, non-tender to palpation,no hepatosplenomegaly, no rigidity or guarding       10. Skin:  No cyanosis, normal texture and turgor, no rash, lesions or ulcers  11.Musculoskeletal:  Good muscle tone,  joints appear normal , no effusions,  normal range of motion    Data Review:    CBC Recent Labs  Lab 05/16/18 1439  WBC 13.2*  HGB 14.0  HCT 42.9  PLT 135*  MCV 94.7  MCH 30.9  MCHC 32.6  RDW 14.3  LYMPHSABS 2.1  MONOABS 2.2*  EOSABS 0.0  BASOSABS 0.0   ------------------------------------------------------------------------------------------------------------------  Chemistries  Recent Labs  Lab 05/16/18 1439 05/16/18 1647  NA 131*  --   K 4.1  --   CL 95*  --   CO2 27  --   GLUCOSE 122*  --   BUN 15  --   CREATININE 0.92  --   CALCIUM 8.8*  --   AST  --  16  ALT  --  17  ALKPHOS  --  48  BILITOT  --  1.1   ------------------------------------------------------------------------------------------------------------------  ------------------------------------------------------------------------------------------------------------------ GFR: Estimated Creatinine Clearance: 55.6 mL/min (by C-G formula based on SCr of 0.92 mg/dL). Liver Function Tests: Recent Labs  Lab 05/16/18 1647  AST 16  ALT 17  ALKPHOS 48  BILITOT 1.1  PROT 7.1  ALBUMIN 3.7   No results for input(s): LIPASE, AMYLASE in the last 168 hours. No results for input(s): AMMONIA in the last 168 hours. Coagulation Profile: No results for input(s): INR, PROTIME in the last 168 hours. Cardiac Enzymes: Recent  Labs  Lab 05/16/18 1908  TROPONINI <0.03   BNP (last 3 results) No results for input(s): PROBNP in the last 8760 hours. HbA1C: No results for input(s): HGBA1C in the last 72 hours. CBG: Recent Labs  Lab 05/16/18 2154  GLUCAP 144*   Lipid Profile: No results for input(s): CHOL, HDL, LDLCALC, TRIG, CHOLHDL, LDLDIRECT in the last 72 hours. Thyroid Function Tests: No results for input(s): TSH, T4TOTAL, FREET4, T3FREE, THYROIDAB in the last 72 hours. Anemia Panel: No results for input(s): VITAMINB12, FOLATE, FERRITIN, TIBC, IRON, RETICCTPCT in the last 72 hours.  --------------------------------------------------------------------------------------------------------------- Urine analysis:    Component Value Date/Time   COLORURINE YELLOW 05/16/2018 1616   APPEARANCEUR CLEAR 05/16/2018 1616   LABSPEC 1.020 05/16/2018 1616   PHURINE 6.0 05/16/2018 1616   GLUCOSEU NEGATIVE 05/16/2018 1616   HGBUR MODERATE (A) 05/16/2018 1616   HGBUR trace-intact 06/04/2010 0932   BILIRUBINUR NEGATIVE 05/16/2018 1616   BILIRUBINUR negative 08/06/2012 1522   KETONESUR 5 (A) 05/16/2018 1616   PROTEINUR NEGATIVE 05/16/2018 1616   UROBILINOGEN 0.2 08/06/2012 1522   UROBILINOGEN 0.2 12/15/2011 1255   NITRITE NEGATIVE 05/16/2018 1616   LEUKOCYTESUR NEGATIVE 05/16/2018 1616      Imaging Results:    Dg Chest 2 View  Result Date: 05/16/2018 CLINICAL DATA:  [place] EXAM: CHEST - 2 VIEW COMPARISON:  02/17/2016 FINDINGS: Lungs are clear.  No pleural effusion or pneumothorax. The heart is normal in size. Visualized osseous structures are within normal limits. IMPRESSION: Normal chest radiographs. Electronically Signed   By: Sriyesh  Krishnan M.D.   On: 05/16/2018 17:46   Ct Head Wo Contrast  Result Date: 05/16/2018 CLINICAL DATA:  Headache EXAM: CT HEAD WITHOUT CONTRAST TECHNIQUE: Contiguous axial images were obtained from the base of the skull through the vertex without intravenous contrast. COMPARISON:   None. FINDINGS: Brain: No evidence   of acute infarction, hemorrhage, hydrocephalus, extra-axial collection or mass lesion/mass effect. Mild subcortical white matter and periventricular small vessel ischemic changes. Vascular: No hyperdense vessel or unexpected calcification. Skull: Normal. Negative for fracture or focal lesion. Sinuses/Orbits: The visualized paranasal sinuses are essentially clear. The mastoid air cells are unopacified. Other: None. IMPRESSION: No evidence of acute intracranial abnormality. Mild small vessel ischemic changes. Electronically Signed   By: Sriyesh  Krishnan M.D.   On: 05/16/2018 17:27    My personal review of EKG: Rhythm NSR   Assessment & Plan:    Active Problems:   Dizziness   1. Dizziness/confusion-unclear etiology, CT head is negative for stroke.  Patient was prescribed prednisone along with Flexeril by her PCP which she completed few days ago as per family.  She does not have any focal deficits.  Will obtain MRI brain in a.m., if it shows stroke consider stroke work-up.  Patient is not a TPA candidate as symptoms have been going on for more than 24 hours. 2. Low back pain-patient had an MRI of the lumbar spine on 16th August which showed mild disc bulging at L4-L5 3. Diabetes mellitus-hold metformin, start sliding scale insulin with NovoLog. 4. Hyponatremia-mild, sodium is 131 likely from prednisone use, patient is on IV fluids.  Follow BMP in am. 5. Hyperlipidemia-continue pravastatin    DVT Prophylaxis-   Lovenox   AM Labs Ordered, also please review Full Orders  Family Communication: Admission, patients condition and plan of care including tests being ordered have been discussed with the patient who indicate understanding and agree with the plan and Code Status.  Code Status: Full code  Admission status: Observation  Time spent in minutes : 60 minutes   Gagan S Lama M.D on 05/17/2018 at 1:48 AM  Between 7am to 7pm - Pager - [phone]. After  7pm go to [email or web address] - password [email or web address]  [facility] - Office  [phone]     

## 2018-05-17 NOTE — Discharge Instructions (Signed)
All of your tests came back OK.  Please follow up with Dr. Moshe Cipro about managing your blood pressure and to have your blood pressure rechecked.   Seek medical care or return to ER if symptoms come back, worsen or new problem develops.    Please check your blood pressure at home with a home blood pressure monitor and report your readings to Dr. Griffin Dakin office for review.     Follow with Primary MD  Fayrene Helper, MD  and other consultants as instructed your Hospitalist MD  Please get a complete blood count and chemistry panel checked by your Primary MD at your next visit, and again as instructed by your Primary MD.  Get Medicines reviewed and adjusted: Please take all your medications with you for your next visit with your Primary MD  Laboratory/radiological data: Please request your Primary MD to go over all hospital tests and procedure/radiological results at the follow up, please ask your Primary MD to get all Hospital records sent to his/her office.  In some cases, they will be blood work, cultures and biopsy results pending at the time of your discharge. Please request that your primary care M.D. follows up on these results.  Also Note the following: If you experience worsening of your admission symptoms, develop shortness of breath, life threatening emergency, suicidal or homicidal thoughts you must seek medical attention immediately by calling 911 or calling your MD immediately  if symptoms less severe.  You must read complete instructions/literature along with all the possible adverse reactions/side effects for all the Medicines you take and that have been prescribed to you. Take any new Medicines after you have completely understood and accpet all the possible adverse reactions/side effects.   Do not drive when taking Pain medications or sleeping medications (Benzodaizepines)  Do not take more than prescribed Pain, Sleep and Anxiety Medications. It is not advisable to  combine anxiety,sleep and pain medications without talking with your primary care practitioner  Special Instructions: If you have smoked or chewed Tobacco  in the last 2 yrs please stop smoking, stop any regular Alcohol  and or any Recreational drug use.  Wear Seat belts while driving.  Please note: You were cared for by a hospitalist during your hospital stay. Once you are discharged, your primary care physician will handle any further medical issues. Please note that NO REFILLS for any discharge medications will be authorized once you are discharged, as it is imperative that you return to your primary care physician (or establish a relationship with a primary care physician if you do not have one) for your post hospital discharge needs so that they can reassess your need for medications and monitor your lab values.      Fall Prevention in the Home Falls can cause injuries. They can happen to people of all ages. There are many things you can do to make your home safe and to help prevent falls. What can I do on the outside of my home?  Regularly fix the edges of walkways and driveways and fix any cracks.  Remove anything that might make you trip as you walk through a door, such as a raised step or threshold.  Trim any bushes or trees on the path to your home.  Use bright outdoor lighting.  Clear any walking paths of anything that might make someone trip, such as rocks or tools.  Regularly check to see if handrails are loose or broken. Make sure that both sides of any steps  have handrails.  Any raised decks and porches should have guardrails on the edges.  Have any leaves, snow, or ice cleared regularly.  Use sand or salt on walking paths during winter.  Clean up any spills in your garage right away. This includes oil or grease spills. What can I do in the bathroom?  Use night lights.  Install grab bars by the toilet and in the tub and shower. Do not use towel bars as grab  bars.  Use non-skid mats or decals in the tub or shower.  If you need to sit down in the shower, use a plastic, non-slip stool.  Keep the floor dry. Clean up any water that spills on the floor as soon as it happens.  Remove soap buildup in the tub or shower regularly.  Attach bath mats securely with double-sided non-slip rug tape.  Do not have throw rugs and other things on the floor that can make you trip. What can I do in the bedroom?  Use night lights.  Make sure that you have a light by your bed that is easy to reach.  Do not use any sheets or blankets that are too big for your bed. They should not hang down onto the floor.  Have a firm chair that has side arms. You can use this for support while you get dressed.  Do not have throw rugs and other things on the floor that can make you trip. What can I do in the kitchen?  Clean up any spills right away.  Avoid walking on wet floors.  Keep items that you use a lot in easy-to-reach places.  If you need to reach something above you, use a strong step stool that has a grab bar.  Keep electrical cords out of the way.  Do not use floor polish or wax that makes floors slippery. If you must use wax, use non-skid floor wax.  Do not have throw rugs and other things on the floor that can make you trip. What can I do with my stairs?  Do not leave any items on the stairs.  Make sure that there are handrails on both sides of the stairs and use them. Fix handrails that are broken or loose. Make sure that handrails are as long as the stairways.  Check any carpeting to make sure that it is firmly attached to the stairs. Fix any carpet that is loose or worn.  Avoid having throw rugs at the top or bottom of the stairs. If you do have throw rugs, attach them to the floor with carpet tape.  Make sure that you have a light switch at the top of the stairs and the bottom of the stairs. If you do not have them, ask someone to add them for  you. What else can I do to help prevent falls?  Wear shoes that: ? Do not have high heels. ? Have rubber bottoms. ? Are comfortable and fit you well. ? Are closed at the toe. Do not wear sandals.  If you use a stepladder: ? Make sure that it is fully opened. Do not climb a closed stepladder. ? Make sure that both sides of the stepladder are locked into place. ? Ask someone to hold it for you, if possible.  Clearly mark and make sure that you can see: ? Any grab bars or handrails. ? First and last steps. ? Where the edge of each step is.  Use tools that help you move around (  mobility aids) if they are needed. These include: ? Canes. ? Walkers. ? Scooters. ? Crutches.  Turn on the lights when you go into a dark area. Replace any light bulbs as soon as they burn out.  Set up your furniture so you have a clear path. Avoid moving your furniture around.  If any of your floors are uneven, fix them.  If there are any pets around you, be aware of where they are.  Review your medicines with your doctor. Some medicines can make you feel dizzy. This can increase your chance of falling. Ask your doctor what other things that you can do to help prevent falls. This information is not intended to replace advice given to you by your health care provider. Make sure you discuss any questions you have with your health care provider. Document Released: 07/05/2009 Document Revised: 02/14/2016 Document Reviewed: 10/13/2014 Elsevier Interactive Patient Education  2018 [employer].    Managing Your Hypertension Hypertension is commonly called high blood pressure. This is when the force of your blood pressing against the walls of your arteries is too strong. Arteries are blood vessels that carry blood from your heart throughout your body. Hypertension forces the heart to work harder to pump blood, and may cause the arteries to become narrow or stiff. Having untreated or uncontrolled hypertension  can cause heart attack, stroke, kidney disease, and other problems. What are blood pressure readings? A blood pressure reading consists of a higher number over a lower number. Ideally, your blood pressure should be below 120/80. The first ("top") number is called the systolic pressure. It is a measure of the pressure in your arteries as your heart beats. The second ("bottom") number is called the diastolic pressure. It is a measure of the pressure in your arteries as the heart relaxes. What does my blood pressure reading mean? Blood pressure is classified into four stages. Based on your blood pressure reading, your health care provider may use the following stages to determine what type of treatment you need, if any. Systolic pressure and diastolic pressure are measured in a unit called mm Hg. Normal  Systolic pressure: below 009.  Diastolic pressure: below 80. Elevated  Systolic pressure: 381-829.  Diastolic pressure: below 80. Hypertension stage 1  Systolic pressure: 937-169.  Diastolic pressure: 67-89. Hypertension stage 2  Systolic pressure: 381 or above.  Diastolic pressure: 90 or above. What health risks are associated with hypertension? Managing your hypertension is an important responsibility. Uncontrolled hypertension can lead to:  A heart attack.  A stroke.  A weakened blood vessel (aneurysm).  Heart failure.  Kidney damage.  Eye damage.  Metabolic syndrome.  Memory and concentration problems.  What changes can I make to manage my hypertension? Hypertension can be managed by making lifestyle changes and possibly by taking medicines. Your health care provider will help you make a plan to bring your blood pressure within a normal range. Eating and drinking  Eat a diet that is high in fiber and potassium, and low in salt (sodium), added sugar, and fat. An example eating plan is called the DASH (Dietary Approaches to Stop Hypertension) diet. To eat this  way: ? Eat plenty of fresh fruits and vegetables. Try to fill half of your plate at each meal with fruits and vegetables. ? Eat whole grains, such as whole wheat pasta, brown rice, or whole grain bread. Fill about one quarter of your plate with whole grains. ? Eat low-fat diary products. ? Avoid fatty cuts of  meat, processed or cured meats, and poultry with skin. Fill about one quarter of your plate with lean proteins such as fish, chicken without skin, beans, eggs, and tofu. ? Avoid premade and processed foods. These tend to be higher in sodium, added sugar, and fat.  Reduce your daily sodium intake. Most people with hypertension should eat less than 1,500 mg of sodium a day.  Limit alcohol intake to no more than 1 drink a day for nonpregnant women and 2 drinks a day for men. One drink equals 12 oz of beer, 5 oz of wine, or 1 oz of hard liquor. Lifestyle  Work with your health care provider to maintain a healthy body weight, or to lose weight. Ask what an ideal weight is for you.  Get at least 30 minutes of exercise that causes your heart to beat faster (aerobic exercise) most days of the week. Activities may include walking, swimming, or biking.  Include exercise to strengthen your muscles (resistance exercise), such as weight lifting, as part of your weekly exercise routine. Try to do these types of exercises for 30 minutes at least 3 days a week.  Do not use any products that contain nicotine or tobacco, such as cigarettes and e-cigarettes. If you need help quitting, ask your health care provider.  Control any long-term (chronic) conditions you have, such as high cholesterol or diabetes. Monitoring  Monitor your blood pressure at home as told by your health care provider. Your personal target blood pressure may vary depending on your medical conditions, your age, and other factors.  Have your blood pressure checked regularly, as often as told by your health care provider. Working with  your health care provider  Review all the medicines you take with your health care provider because there may be side effects or interactions.  Talk with your health care provider about your diet, exercise habits, and other lifestyle factors that may be contributing to hypertension.  Visit your health care provider regularly. Your health care provider can help you create and adjust your plan for managing hypertension. Will I need medicine to control my blood pressure? Your health care provider may prescribe medicine if lifestyle changes are not enough to get your blood pressure under control, and if:  Your systolic blood pressure is 130 or higher.  Your diastolic blood pressure is 80 or higher.  Take medicines only as told by your health care provider. Follow the directions carefully. Blood pressure medicines must be taken as prescribed. The medicine does not work as well when you skip doses. Skipping doses also puts you at risk for problems. Contact a health care provider if:  You think you are having a reaction to medicines you have taken.  You have repeated (recurrent) headaches.  You feel dizzy.  You have swelling in your ankles.  You have trouble with your vision. Get help right away if:  You develop a severe headache or confusion.  You have unusual weakness or numbness, or you feel faint.  You have severe pain in your chest or abdomen.  You vomit repeatedly.  You have trouble breathing. Summary  Hypertension is when the force of blood pumping through your arteries is too strong. If this condition is not controlled, it may put you at risk for serious complications.  Your personal target blood pressure may vary depending on your medical conditions, your age, and other factors. For most people, a normal blood pressure is less than 120/80.  Hypertension is managed by lifestyle changes,  medicines, or both. Lifestyle changes include weight loss, eating a healthy, low-sodium  diet, exercising more, and limiting alcohol. This information is not intended to replace advice given to you by your health care provider. Make sure you discuss any questions you have with your health care provider. Document Released: 06/02/2012 Document Revised: 08/06/2016 Document Reviewed: 08/06/2016 Elsevier Interactive Patient Education  2018 [employer] Eating Plan DASH stands for "Dietary Approaches to Stop Hypertension." The DASH eating plan is a healthy eating plan that has been shown to reduce high blood pressure (hypertension). It may also reduce your risk for type 2 diabetes, heart disease, and stroke. The DASH eating plan may also help with weight loss. What are tips for following this plan? General guidelines  Avoid eating more than 2,300 mg (milligrams) of salt (sodium) a day. If you have hypertension, you may need to reduce your sodium intake to 1,500 mg a day.  Limit alcohol intake to no more than 1 drink a day for nonpregnant women and 2 drinks a day for men. One drink equals 12 oz of beer, 5 oz of wine, or 1 oz of hard liquor.  Work with your health care provider to maintain a healthy body weight or to lose weight. Ask what an ideal weight is for you.  Get at least 30 minutes of exercise that causes your heart to beat faster (aerobic exercise) most days of the week. Activities may include walking, swimming, or biking.  Work with your health care provider or diet and nutrition specialist (dietitian) to adjust your eating plan to your individual calorie needs. Reading food labels  Check food labels for the amount of sodium per serving. Choose foods with less than 5 percent of the Daily Value of sodium. Generally, foods with less than 300 mg of sodium per serving fit into this eating plan.  To find whole grains, look for the word "whole" as the first word in the ingredient list. Shopping  Buy products labeled as "low-sodium" or "no salt added."  Buy fresh  foods. Avoid canned foods and premade or frozen meals. Cooking  Avoid adding salt when cooking. Use salt-free seasonings or herbs instead of table salt or sea salt. Check with your health care provider or pharmacist before using salt substitutes.  Do not fry foods. Cook foods using healthy methods such as baking, boiling, grilling, and broiling instead.  Cook with heart-healthy oils, such as olive, canola, soybean, or sunflower oil. Meal planning   Eat a balanced diet that includes: ? 5 or more servings of fruits and vegetables each day. At each meal, try to fill half of your plate with fruits and vegetables. ? Up to 6-8 servings of whole grains each day. ? Less than 6 oz of lean meat, poultry, or fish each day. A 3-oz serving of meat is about the same size as a deck of cards. One egg equals 1 oz. ? 2 servings of low-fat dairy each day. ? A serving of nuts, seeds, or beans 5 times each week. ? Heart-healthy fats. Healthy fats called Omega-3 fatty acids are found in foods such as flaxseeds and coldwater fish, like sardines, salmon, and mackerel.  Limit how much you eat of the following: ? Canned or prepackaged foods. ? Food that is high in trans fat, such as fried foods. ? Food that is high in saturated fat, such as fatty meat. ? Sweets, desserts, sugary drinks, and other foods with added sugar. ? Full-fat dairy products.  Do not salt foods before eating.  Try to eat at least 2 vegetarian meals each week.  Eat more home-cooked food and less restaurant, buffet, and fast food.  When eating at a restaurant, ask that your food be prepared with less salt or no salt, if possible. What foods are recommended? The items listed may not be a complete list. Talk with your dietitian about what dietary choices are best for you. Grains Whole-grain or whole-wheat bread. Whole-grain or whole-wheat pasta. Brown rice. [place]. Bulgur. Whole-grain and low-sodium cereals. Pita bread. Low-fat,  low-sodium crackers. Whole-wheat flour tortillas. Vegetables Fresh or frozen vegetables (raw, steamed, roasted, or grilled). Low-sodium or reduced-sodium tomato and vegetable juice. Low-sodium or reduced-sodium tomato sauce and tomato paste. Low-sodium or reduced-sodium canned vegetables. Fruits All fresh, dried, or frozen fruit. Canned fruit in natural juice (without added sugar). Meat and other protein foods Skinless chicken or [place]. Ground chicken or [place]. Pork with fat trimmed off. Fish and seafood. Egg whites. Dried beans, peas, or lentils. Unsalted nuts, nut butters, and seeds. Unsalted canned beans. Lean cuts of beef with fat trimmed off. Low-sodium, lean deli meat. Dairy Low-fat (1%) or fat-free (skim) milk. Fat-free, low-fat, or reduced-fat cheeses. Nonfat, low-sodium ricotta or cottage cheese. Low-fat or nonfat yogurt. Low-fat, low-sodium cheese. Fats and oils Soft margarine without trans fats. Vegetable oil. Low-fat, reduced-fat, or light mayonnaise and salad dressings (reduced-sodium). Canola, safflower, olive, soybean, and sunflower oils. Avocado. Seasoning and other foods Herbs. Spices. Seasoning mixes without salt. Unsalted popcorn and pretzels. Fat-free sweets. What foods are not recommended? The items listed may not be a complete list. Talk with your dietitian about what dietary choices are best for you. Grains Baked goods made with fat, such as croissants, muffins, or some breads. Dry pasta or rice meal packs. Vegetables Creamed or fried vegetables. Vegetables in a cheese sauce. Regular canned vegetables (not low-sodium or reduced-sodium). Regular canned tomato sauce and paste (not low-sodium or reduced-sodium). Regular tomato and vegetable juice (not low-sodium or reduced-sodium). Angie Fava. Olives. Fruits Canned fruit in a light or heavy syrup. Fried fruit. Fruit in cream or butter sauce. Meat and other protein foods Fatty cuts of meat. Ribs. Fried meat. Berniece Salines. Sausage.  Bologna and other processed lunch meats. Salami. Fatback. Hotdogs. Bratwurst. Salted nuts and seeds. Canned beans with added salt. Canned or smoked fish. Whole eggs or egg yolks. Chicken or [place] with skin. Dairy Whole or 2% milk, cream, and half-and-half. Whole or full-fat cream cheese. Whole-fat or sweetened yogurt. Full-fat cheese. Nondairy creamers. Whipped toppings. Processed cheese and cheese spreads. Fats and oils Butter. Stick margarine. Lard. Shortening. Ghee. Bacon fat. Tropical oils, such as coconut, palm kernel, or palm oil. Seasoning and other foods Salted popcorn and pretzels. Onion salt, garlic salt, seasoned salt, table salt, and sea salt. Worcestershire sauce. Tartar sauce. Barbecue sauce. Teriyaki sauce. Soy sauce, including reduced-sodium. Steak sauce. Canned and packaged gravies. Fish sauce. Oyster sauce. Cocktail sauce. Horseradish that you find on the shelf. Ketchup. Mustard. Meat flavorings and tenderizers. Bouillon cubes. Hot sauce and Tabasco sauce. Premade or packaged marinades. Premade or packaged taco seasonings. Relishes. Regular salad dressings. Where to find more information:  National Heart, Lung, and [place]: https://wilson-eaton.com/  American Heart Association: www.heart.org Summary  The DASH eating plan is a healthy eating plan that has been shown to reduce high blood pressure (hypertension). It may also reduce your risk for type 2 diabetes, heart disease, and stroke.  With the DASH eating plan, you should limit salt (sodium)  intake to 2,300 mg a day. If you have hypertension, you may need to reduce your sodium intake to 1,500 mg a day.  When on the DASH eating plan, aim to eat more fresh fruits and vegetables, whole grains, lean proteins, low-fat dairy, and heart-healthy fats.  Work with your health care provider or diet and nutrition specialist (dietitian) to adjust your eating plan to your individual calorie needs. This information is not intended to  replace advice given to you by your health care provider. Make sure you discuss any questions you have with your health care provider. Document Released: 08/28/2011 Document Revised: 09/01/2016 Document Reviewed: 09/01/2016 Elsevier Interactive Patient Education  2018 [employer].    How to Take Your Blood Pressure You can take your blood pressure at home with a machine. You may need to check your blood pressure at home:  To check if you have high blood pressure (hypertension).  To check your blood pressure over time.  To make sure your blood pressure medicine is working.  Supplies needed: You will need a blood pressure machine, or monitor. You can buy one at a drugstore or online. When choosing one:  Choose one with an arm cuff.  Choose one that wraps around your upper arm. Only one finger should fit between your arm and the cuff.  Do not choose one that measures your blood pressure from your wrist or finger.  Your doctor can suggest a monitor. How to prepare Avoid these things for 30 minutes before checking your blood pressure:  Drinking caffeine.  Drinking alcohol.  Eating.  Smoking.  Exercising.  Five minutes before checking your blood pressure:  Pee.  Sit in a dining chair. Avoid sitting in a soft couch or armchair.  Be quiet. Do not talk.  How to take your blood pressure Follow the instructions that came with your machine. If you have a digital blood pressure monitor, these may be the instructions: 1. Sit up straight. 2. Place your feet on the floor. Do not cross your ankles or legs. 3. Rest your left arm at the level of your heart. You may rest it on a table, desk, or chair. 4. Pull up your shirt sleeve. 5. Wrap the blood pressure cuff around the upper part of your left arm. The cuff should be 1 inch (2.5 cm) above your elbow. It is best to wrap the cuff around bare skin. 6. Fit the cuff snugly around your arm. You should be able to place only one finger  between the cuff and your arm. 7. Put the cord inside the groove of your elbow. 8. Press the power button. 9. Sit quietly while the cuff fills with air and loses air. 10. Write down the numbers on the screen. 11. Wait 2-3 minutes and then repeat steps 1-10.  What do the numbers mean? Two numbers make up your blood pressure. The first number is called systolic pressure. The second is called diastolic pressure. An example of a blood pressure reading is "120 over 80" (or 120/80). If you are an adult and do not have a medical condition, use this guide to find out if your blood pressure is normal: Normal  First number: below 120.  Second number: below 80. Elevated  First number: 120-129.  Second number: below 80. Hypertension stage 1  First number: 130-139.  Second number: 80-89. Hypertension stage 2  First number: 140 or above.  Second number: 62 or above. Your blood pressure is above normal even if only the  top or bottom number is above normal. Follow these instructions at home:  Check your blood pressure as often as your doctor tells you to.  Take your monitor to your next doctor's appointment. Your doctor will: ? Make sure you are using it correctly. ? Make sure it is working right.  Make sure you understand what your blood pressure numbers should be.  Tell your doctor if your medicines are causing side effects. Contact a doctor if:  Your blood pressure keeps being high. Get help right away if:  Your first blood pressure number is higher than 180.  Your second blood pressure number is higher than 120. This information is not intended to replace advice given to you by your health care provider. Make sure you discuss any questions you have with your health care provider. Document Released: 08/21/2008 Document Revised: 08/06/2016 Document Reviewed: 02/15/2016 Elsevier Interactive Patient Education  [employer].

## 2018-05-17 NOTE — Plan of Care (Signed)
  Problem: Acute Rehab PT Goals(only PT should resolve) Goal: Patient Will Transfer Sit To/From Stand Outcome: Progressing Flowsheets (Taken 05/17/2018 1458) Patient will transfer sit to/from stand: with modified independence Goal: Pt Will Transfer Bed To Chair/Chair To Bed Outcome: Progressing Flowsheets (Taken 05/17/2018 1458) Pt will Transfer Bed to Chair/Chair to Bed: with modified independence Goal: Pt Will Ambulate Outcome: Progressing Flowsheets (Taken 05/17/2018 1458) Pt will Ambulate: > 125 feet; with modified independence; with rolling walker   2:58 PM, 05/17/18 Lonell Grandchild, MPT Physical Therapist with [facility] [phone] office [phone] mobile phone

## 2018-05-17 NOTE — Evaluation (Signed)
Physical Therapy Evaluation Patient Details Name: Jillian [name] MRN: [id number] DOB: [birth year] Today's Date: 05/17/2018   History of Present Illness  Jillian [name]  is a 77 y.o. female, with history of chronic back pain, diabetes mellitus, hypertension, dyslipidemia who came to hospital with complaints of headache, mild confusion which has now resolved.  Apparently patient has low back pain and was seen by PCP Dr. Moshe Cipro she was given an anti-inflammatory injection along with prednisone.  Patient also took Flexeril over the past few days.  As per family patient became somnolent with a limp and was confused yesterday and today complained of headache so they brought her to the hospital.  In the ED there was concern for gait disturbance, patient denies back pain at this time.    Clinical Impression  Patient limited for functional mobility as stated below secondary to generalized weakness, fatigue and having to lean on nearby objects for support requiring use of RW for gait training without loss of balance.  Patient will benefit from continued physical therapy in hospital to increase strength, balance, endurance for safe ADLs and gait.     Follow Up Recommendations No PT follow up;Supervision - Intermittent    Equipment Recommendations  None recommended by PT    Recommendations for Other Services       Precautions / Restrictions Precautions Precautions: None Restrictions Weight Bearing Restrictions: No      Mobility  Bed Mobility Overal bed mobility: Modified Independent             General bed mobility comments: increased time, uses bed rail  Transfers Overall transfer level: Needs assistance Equipment used: None;Rolling walker (2 wheeled) Transfers: Sit to/from Stand;Stand Pivot Transfers Sit to Stand: Supervision Stand pivot transfers: Supervision       General transfer comment: able to transfer to commode in bathroom pushing IV pole without loss of  balance  Ambulation/Gait Ambulation/Gait assistance: Supervision Gait Distance (Feet): 100 Feet Assistive device: Rolling walker (2 wheeled) Gait Pattern/deviations: Decreased step length - right;Decreased step length - left;Decreased stride length Gait velocity: decreased   General Gait Details: slow slightly labored cadence with tendency to lean on nearby objects for support requiring use of RW, no loss of balance using RW   Stairs            Wheelchair Mobility    Modified Rankin (Stroke Patients Only)       Balance Overall balance assessment: Mild deficits observed, not formally tested                                           Pertinent Vitals/Pain Pain Assessment: 0-10 Pain Score: 4  Pain Location: headache Pain Descriptors / Indicators: Aching Pain Intervention(s): Limited activity within patient's tolerance;Monitored during session    Home Living Family/patient expects to be discharged to:: Private residence Living Arrangements: Non-relatives/Friends Available Help at Discharge: Family Type of Home: House Home Access: Ramped entrance     Home Layout: One level Home Equipment: Environmental consultant - 2 wheels;Bedside commode;Hand held shower head;Wheelchair - manual      Prior Function Level of Independence: Independent         Comments: community ambulator, drives     Journalist, newspaper        Extremity/Trunk Assessment   Upper Extremity Assessment Upper Extremity Assessment: Overall WFL for tasks assessed    Lower Extremity Assessment Lower Extremity Assessment:  Overall [facility] for tasks assessed    Cervical / Trunk Assessment Cervical / Trunk Assessment: Normal  Communication   Communication: No difficulties  Cognition Arousal/Alertness: Awake/alert Behavior During Therapy: WFL for tasks assessed/performed Overall Cognitive Status: Within Functional Limits for tasks assessed                                         General Comments      Exercises     Assessment/Plan    PT Assessment Patient needs continued PT services  PT Problem List Decreased activity tolerance;Decreased balance;Decreased mobility       PT Treatment Interventions Gait training;Stair training;Functional mobility training;Therapeutic activities;Therapeutic exercise;Patient/family education    PT Goals (Current goals can be found in the Care Plan section)  Acute Rehab PT Goals Patient Stated Goal: return home with family to assist PT Goal Formulation: With patient/family Time For Goal Achievement: 05/22/18 Potential to Achieve Goals: Good    Frequency Min 3X/week   Barriers to discharge        Co-evaluation               AM-PAC PT "6 Clicks" Daily Activity  Outcome Measure Difficulty turning over in bed (including adjusting bedclothes, sheets and blankets)?: None Difficulty moving from lying on back to sitting on the side of the bed? : None Difficulty sitting down on and standing up from a chair with arms (e.g., wheelchair, bedside commode, etc,.)?: None Help needed moving to and from a bed to chair (including a wheelchair)?: None Help needed walking in hospital room?: A Little Help needed climbing 3-5 steps with a railing? : A Little 6 Click Score: 22    End of Session   Activity Tolerance: Patient tolerated treatment well Patient left: in bed;with call bell/phone within reach;with family/visitor present Nurse Communication: Mobility status PT Visit Diagnosis: Unsteadiness on feet (R26.81);Other abnormalities of gait and mobility (R26.89);Muscle weakness (generalized) (M62.81)    Time: 1106-1130 PT Time Calculation (min) (ACUTE ONLY): 24 min   Charges:   PT Evaluation $PT Eval Moderate Complexity: 1 Mod PT Treatments $Therapeutic Activity: 23-37 mins        2:56 PM, 05/17/18 Lonell Grandchild, MPT Physical Therapist with [facility] [phone] office [phone] mobile phone

## 2018-05-17 NOTE — ED Provider Notes (Signed)
[facility] EMERGENCY DEPARTMENT Provider Note   CSN: [id number] Arrival date & time: 05/16/18  1138     History   Chief Complaint Chief Complaint  Patient presents with  . Migraine    HPI Jillian [name] is a 77 y.o. female.  Patient brought in by family.  Family feels that patient is a bit confused and not thinking clearly.  And having difficulty walking.  And has had a complaint of a frontal headache that he initially said since Thursday.  But no other family member states that that has been present for about a month.  She is known to have a lumbar back problem.  Seen by Dr. Moshe Cipro had an MRI done.  The patient's complaint is really not leg pain or pain with movement.  But she states that she is off balance when she walks.  Family agrees with that as well.  There is been some nausea associated but no vomiting no fevers.  No incontinence.  Denies any numbness to the legs.  Chart reviews shows that there was probably some injections that were done to her back.  Orting to one family member patient is also been seen in the [place] area for this.  But the main concern is her thinking change in her difficulty with walking that seems to be not pain related.     Past Medical History:  Diagnosis Date  . Chronic fatigue   . Diabetes mellitus 2011  . DJD (degenerative joint disease)    of neck    . Dyslipidemia   . Hypertension 2005  . Mild obesity   . Postmenopausal   . Wears glasses     Patient Active Problem List   Diagnosis Date Noted  . Diabetes mellitus type 2 in obese ([place]) 05/06/2018  . Back pain with right-sided radiculopathy 04/28/2018  . Longitudinal split nail 02/15/2018  . Bursitis of deltoid, left 09/09/2017  . Chronic left SI joint pain 05/21/2016  . Primary osteoarthritis of both knees 06/26/2015  . 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 Thoracic spine pain 09/28/2014  . Hip pain 08/06/2012  . 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  Hot flashes, menopausal 09/10/2011  . 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 INSOMNIA, CHRONIC 11/28/2009  . Hyperlipidemia with target LDL less than 100 10/12/2007  . Obesity (BMI 30.0-34.9) 10/12/2007  . ESSENTIAL HYPERTENSION, BENIGN 10/12/2007    Past Surgical History:  Procedure Laterality Date  . ABDOMINAL HYSTERECTOMY  1992   fibroids  . APPENDECTOMY    . BREAST EXCISIONAL BIOPSY Right    benign  . BREAST LUMPECTOMY WITH RADIOACTIVE SEED LOCALIZATION Right 10/05/2014   Procedure: BREAST LUMPECTOMY WITH RADIOACTIVE SEED LOCALIZATION;  Surgeon: Erroll Luna, MD;  Location: [place];  Service: General;  Laterality: Right;  . cataract Bilateral   . CYST EXCISION Right    arm  . EYE SURGERY Right 06/07/2013   cataract  . EYE SURGERY Left 09/14/2013   cataract  . KNEE ARTHROSCOPY Right 02/26/2016   Procedure: ARTHROSCOPY RIGHT KNEE WITH MENSICAL DEBRIDEMENT;  Surgeon: Gaynelle Arabian, MD;  Location: [facility];  Service: Orthopedics;  Laterality: Right;  . left breast biopsy    . LESION REMOVAL Right 03/30/2013   Procedure: EXCISION OF NEOPLASM ARM;  Surgeon: Jamesetta So, MD;  Location: [facility];  Service: General;  Laterality: Right;  . TUBAL LIGATION       OB History    [place]  2   Para  2   Term  2   Preterm      AB      Living        SAB      TAB      Ectopic      Multiple      Live Births               Home Medications    Prior to Admission medications   Medication Sig Start Date End Date Taking? Authorizing Provider  acetaminophen (TYLENOL) 500 MG tablet Take 1,000 mg by mouth every 6 (six) hours as needed for mild pain.   Yes [provider]  benazepril (LOTENSIN) 10 MG tablet Take 1 tablet (10 mg total) by mouth daily. 10/13/17  Yes Fayrene Helper, MD  blood glucose meter kit and supplies Once daily testing- dispense formulary meter and supplies dx e11.9 05/04/18  Yes Fayrene Helper, MD    cloNIDine (CATAPRES) 0.1 MG tablet Take 1 tablet (0.1 mg total) by mouth at bedtime. 10/13/17  Yes Fayrene Helper, MD  glucose blood test strip Use as instructed once daily for one touch meter dx e11.9 05/04/18  Yes Fayrene Helper, MD  ibuprofen (ADVIL,MOTRIN) 600 MG tablet Take 1 tablet (600 mg total) by mouth 3 (three) times daily. 04/28/18  Yes Fayrene Helper, MD  metFORMIN (GLUCOPHAGE-XR) 500 MG 24 hr tablet Take 1 tablet (500 mg total) by mouth 2 (two) times daily. 09/09/17  Yes Fayrene Helper, MD  pravastatin (PRAVACHOL) 10 MG tablet Take 10 mg by mouth daily.   Yes [provider]  ranitidine (ZANTAC) 300 MG tablet TAKE 1 TABLET(300 MG) BY MOUTH AT BEDTIME 04/28/18  Yes Fayrene Helper, MD  tamoxifen (NOLVADEX) 20 MG tablet Take 1 tablet (20 mg total) by mouth daily. 04/30/18  Yes Higgs, Mathis Dad, MD  temazepam (RESTORIL) 15 MG capsule Take 1 capsule (15 mg total) by mouth at bedtime. 04/28/18  Yes Fayrene Helper, MD  venlafaxine XR (EFFEXOR-XR) 37.5 MG 24 hr capsule TAKE 1 CAPSULE (37.5 MG TOTAL) BY MOUTH DAILY WITH BREAKFAST. 04/07/18  Yes Fayrene Helper, MD  aspirin EC 81 MG tablet Take 1 tablet (81 mg total) by mouth every evening. Patient taking differently: Take 81 mg by mouth every morning.  04/23/16   Fayrene Helper, MD  predniSONE (DELTASONE) 20 MG tablet One tablet 3 times daily for 3 days, then one tablet 2 times daily for 3 days, then one tablet daily for 3 days Patient not taking: Reported on 05/16/2018 05/06/18   Fayrene Helper, MD    Family History Family History  Problem Relation Age of Onset  . Hypertension Father   . Congestive Heart Failure Father   . Liver cancer Mother   . Hypertension Sister   . Hypertension Brother   . Diabetes Brother     Social History Social History   Tobacco Use  . Smoking status: Never Smoker  . Smokeless tobacco: Never Used  Substance Use Topics  . Alcohol use: No  . Drug use: No      Allergies   Codeine and Propoxyphene n-acetaminophen   Review of Systems Review of Systems  Constitutional: Positive for chills. Negative for fever.  HENT: Negative for congestion and sinus pressure.   Eyes: Negative for visual disturbance.  Respiratory: Negative for shortness of breath.   Cardiovascular: Negative for chest pain.  Gastrointestinal: Positive for nausea. Negative for abdominal  pain and vomiting.  Genitourinary: Negative for difficulty urinating and dysuria.  Musculoskeletal: Positive for back pain. Negative for neck stiffness.  Skin: Negative for rash.  Neurological: Positive for weakness and headaches.  Hematological: Does not bruise/bleed easily.  Psychiatric/Behavioral: Positive for confusion.     Physical Exam Updated Vital Signs BP (!) 154/91   Pulse 96   Temp 99.3 F (37.4 C) (Oral)   Resp (!) 22   Ht 1.626 m (5' 4")   Wt 84.8 kg   SpO2 95%   BMI 32.10 kg/m   Physical Exam  Constitutional: She appears well-developed and well-nourished. No distress.  HENT:  Head: Normocephalic and atraumatic.  Mucous membranes dry.  Eyes: Pupils are equal, round, and reactive to light. Conjunctivae and EOM are normal.  Neck: Neck supple.  Cardiovascular: Normal rate, regular rhythm and normal heart sounds.  Pulmonary/Chest: Effort normal and breath sounds normal. No respiratory distress. She has no wheezes.  Abdominal: Soft. Bowel sounds are normal. There is no tenderness.  Musculoskeletal: Normal range of motion. She exhibits no edema.  Patient with good movement of both lower extremities in bed.  However with ambulation her gait seems to be as if she is off balance.  Neurological: She is alert. No sensory deficit.  There seems to be some mental confusion.  And some memory issues.  Patient's lower extremity are strong.  She can hold each leg up individually to the count of 10.  Does not seem to cause any back pain.  Upper extremity strength is good no  pronator drift.  Abnormal gait.  Patient when walking complains of feeling as if she is off balance.  Skin: Skin is warm. No erythema.  Nursing note and vitals reviewed.    ED Treatments / Results  Labs (all labs ordered are listed, but only abnormal results are displayed) Labs Reviewed  BASIC METABOLIC PANEL - Abnormal; Notable for the following components:      Result Value   Sodium 131 (*)    Chloride 95 (*)    Glucose, Bld 122 (*)    Calcium 8.8 (*)    GFR calc non Af Amer 59 (*)    All other components within normal limits  CBC WITH DIFFERENTIAL/PLATELET - Abnormal; Notable for the following components:   WBC 13.2 (*)    Platelets 135 (*)    Neutro Abs 8.9 (*)    Monocytes Absolute 2.2 (*)    All other components within normal limits  URINALYSIS, ROUTINE W REFLEX MICROSCOPIC - Abnormal; Notable for the following components:   Hgb urine dipstick MODERATE (*)    Ketones, ur 5 (*)    Bacteria, UA RARE (*)    All other components within normal limits  LACTIC ACID, PLASMA - Abnormal; Notable for the following components:   Lactic Acid, Venous 2.7 (*)    All other components within normal limits  LACTIC ACID, PLASMA - Abnormal; Notable for the following components:   Lactic Acid, Venous 2.1 (*)    All other components within normal limits  D-DIMER, QUANTITATIVE (NOT AT [facility]) - Abnormal; Notable for the following components:   D-Dimer, Quant 1.23 (*)    All other components within normal limits  CBG MONITORING, ED - Abnormal; Notable for the following components:   Glucose-Capillary 144 (*)    All other components within normal limits  CULTURE, BLOOD (ROUTINE X 2)  CULTURE, BLOOD (ROUTINE X 2)  HEPATIC FUNCTION PANEL  TROPONIN I  LACTIC ACID, PLASMA  EKG EKG Interpretation  Date/Time:  Sunday May 16 2018 17:56:50 EDT Ventricular Rate:  87 PR Interval:    QRS Duration: 80 QT Interval:  346 QTC Calculation: 417 R Axis:   0 Text Interpretation:  Sinus rhythm  Abnormal R-wave progression, early transition LVH by voltage Confirmed by Fredia Sorrow [phone]) on 05/16/2018 6:01:50 PM   Radiology Dg Chest 2 View  Result Date: 05/16/2018 CLINICAL DATA:  [place] EXAM: CHEST - 2 VIEW COMPARISON:  02/17/2016 FINDINGS: Lungs are clear.  No pleural effusion or pneumothorax. The heart is normal in size. Visualized osseous structures are within normal limits. IMPRESSION: Normal chest radiographs. Electronically Signed   By: Julian Hy M.D.   On: 05/16/2018 17:46   Ct Head Wo Contrast  Result Date: 05/16/2018 CLINICAL DATA:  Headache EXAM: CT HEAD WITHOUT CONTRAST TECHNIQUE: Contiguous axial images were obtained from the base of the skull through the vertex without intravenous contrast. COMPARISON:  None. FINDINGS: Brain: No evidence of acute infarction, hemorrhage, hydrocephalus, extra-axial collection or mass lesion/mass effect. Mild subcortical white matter and periventricular small vessel ischemic changes. Vascular: No hyperdense vessel or unexpected calcification. Skull: Normal. Negative for fracture or focal lesion. Sinuses/Orbits: The visualized paranasal sinuses are essentially clear. The mastoid air cells are unopacified. Other: None. IMPRESSION: No evidence of acute intracranial abnormality. Mild small vessel ischemic changes. Electronically Signed   By: Julian Hy M.D.   On: 05/16/2018 17:27    Procedures Procedures (including critical care time)  Medications Ordered in ED Medications  0.9 %  sodium chloride infusion ( Intravenous New Bag/Given 05/16/18 1910)  sodium chloride 0.9 % bolus 500 mL (0 mLs Intravenous Stopped 05/16/18 1833)  ondansetron (ZOFRAN) injection 4 mg (4 mg Intravenous Given 05/16/18 1628)  HYDROmorphone (DILAUDID) injection 0.5 mg (0.5 mg Intravenous Given 05/16/18 1629)  ondansetron (ZOFRAN) injection 4 mg (4 mg Intravenous Given 05/16/18 1937)  sodium chloride 0.9 % bolus 500 mL (0 mLs Intravenous Stopped 05/16/18 2056)      Initial Impression / Assessment and Plan / ED Course  I have reviewed the triage vital signs and the nursing notes.  Pertinent labs & imaging results that were available during my care of the patient were reviewed by me and considered in my medical decision making (see chart for details).     Tensive work-up here tonight limited by the fact that MRI could not be obtained.  Head CT negative patient had an MRI of her lumbar back this month that shows some abnormalities.  But patient's exam is not consistent with any significant back pain.  I do not feel that this is the reason why she is having trouble walking.  Patient seems to have good leg strength in bed.  But when she walks appears to be off balance and she would complain that she felt dizzy in the head.  Patient head CT also negative for any frontal sinus abnormalities.  Is complaining of a frontal headache.  However according to family members that actually has been present for about a month.  We thought it was just since Thursday.  Do not feel that she has meningitis.  No neck stiffness.  No fevers here.  Does have a slight increase in white blood cell count.  Patient's oxygen at rest was around 90 to 92% room air.  This led to the d-dimer being done which was elevated however clinically he was not overly concerned about pulmonary embolus but just was worried about the marginal oxygen saturations.  Difficulty  getting a large enough IV to do the CT angios.  Had not been able to get that done.  Also MRI not available to rule out any cause of her ataxia that would be central.  If MRI could be done and that was negative would feel comfortable with sending her home.  We feel that she is going to need to be admitted to have an MRI done in the morning.  May have to have a VQ scan done to further evaluate the elevated d-dimer.  Discussed with Dr. Darrick Meigs who is agreed to admit and get the MRI in the morning.  In addition patient's lactic acid was  elevated a little bit did improve some with fluids.  Patient did have blood cultures done.  Did not receive any antibiotics as there was anything clinically to treat also did not meet sepsis criteria.  Patient's troponin was negative as well so there was not a silent MI.  Is possible with a negative MRI this may be all related to the back but clinically watching her walk and on her exam in bed does not seem as if the back is the main problem with her legs.  Final Clinical Impressions(s) / ED Diagnoses   Final diagnoses:  Ataxia    ED Discharge Orders    None       Fredia Sorrow, MD 05/17/18 [phone]

## 2018-05-17 NOTE — Discharge Summary (Signed)
Physician Discharge Summary  Jillian [name] NKN:[id number] DOB: 1941/08/22 DOA: 05/16/2018  PCP: Jillian Helper, MD  Admit date: 05/16/2018 Discharge date: 05/17/2018  Admitted From: HOME  Disposition: HOME   Recommendations for Outpatient Follow-up:  1. Follow up with PCP in 1 weeks for further blood pressure management 2. Please obtain BMP/CBC in 1-2 weeks  Discharge Condition: STABLE   CODE STATUS: FULL    Brief Hospitalization Summary: Please see all hospital notes, images, labs for full details of the hospitalization.  HPI:    Jillian [name]  is a 77 y.o. female, with history of chronic back pain, diabetes mellitus, hypertension, dyslipidemia who came to hospital with complaints of headache, mild confusion which has now resolved.  Apparently patient has low back pain and was seen by PCP Jillian [name] she was given an anti-inflammatory injection along with prednisone.  Patient also took Flexeril over the past few days.  As per family patient became somnolent with a limp and was confused yesterday and today complained of headache so they brought her to the hospital.  In the ED there was concern for gait disturbance, patient denies back pain at this time. As per family patient has been not thinking clearly. No slurred speech, no blurred vision. No focal weakness of extremities No bladder incontinence No nausea vomiting or diarrhea CT head was negative in the ED. Patient also had elevated d-dimer, 1.23.  CTA chest has been ordered.  1. Dizziness/confusion-unclear etiology, CT head is negative for stroke.  Patient was prescribed prednisone along with Flexeril by her PCP which she completed few days ago as per family.  She does not have any focal deficits.  MRI Brain negative for acute findings.   PT evaluated patient and no further recommendations or needs identified.  Will Discharge patient home with outpatient follow up as noted.   2. Uncontrolled hypertension - Increased ramipril to  20 mg, increased clonidine to 0.1 mg BID, recheck with PCP in 1 week, recommended to patient to obtain home BP cuff and monitor BP at home, start DASH diet and follow up with PCP for further BP management.   3. Low back pain-patient had an MRI of the lumbar spine on 16th August which showed mild disc bulging at L4-L5. Follow up with PCP for further management.   4. Diabetes mellitus-resume home treatment regimen. A1c tested at 6.6%.   5. Hyponatremia-(Relative) secondary to hyperglycemia.   6. Hyperlipidemia-continue pravastatin   DVT Prophylaxis-   Lovenox   AM Labs Ordered, also please review Full Orders  Family Communication: Admission, patients condition and plan of care including tests being ordered have been discussed with the patient who indicate understanding and agree with the plan and Code Status.  Code Status: Full code Discharge Diagnoses:  Active Problems:   Dizziness   Ataxia  Discharge Instructions: Discharge Instructions    Call MD for:  difficulty breathing, headache or visual disturbances   Complete by:  As directed    Call MD for:  extreme fatigue   Complete by:  As directed    Call MD for:  hives   Complete by:  As directed    Call MD for:  persistant dizziness or light-headedness   Complete by:  As directed    Call MD for:  persistant nausea and vomiting   Complete by:  As directed    Call MD for:  severe uncontrolled pain   Complete by:  As directed    Diet - low sodium heart healthy  Complete by:  As directed    Increase activity slowly   Complete by:  As directed      Allergies as of 05/17/2018      Reactions   Codeine Nausea And Vomiting   Propoxyphene N-acetaminophen Other (See Comments)   hallucinations      Medication List    STOP taking these medications   ibuprofen 600 MG tablet Commonly known as:  ADVIL,MOTRIN   predniSONE 20 MG tablet Commonly known as:  DELTASONE     TAKE these medications   acetaminophen 500 MG  tablet Commonly known as:  TYLENOL Take 1,000 mg by mouth every 6 (six) hours as needed for mild pain.   aspirin EC 81 MG tablet Take 1 tablet (81 mg total) by mouth every evening. What changed:  when to take this   benazepril 20 MG tablet Commonly known as:  LOTENSIN Take 1 tablet (20 mg total) by mouth daily. Start taking on:  05/18/2018 What changed:    medication strength  how much to take   blood glucose meter kit and supplies Once daily testing- dispense formulary meter and supplies dx e11.9   cloNIDine 0.1 MG tablet Commonly known as:  CATAPRES Take 1 tablet (0.1 mg total) by mouth 2 (two) times daily. What changed:  when to take this   glucose blood test strip Use as instructed once daily for one touch meter dx e11.9   metFORMIN 500 MG 24 hr tablet Commonly known as:  GLUCOPHAGE-XR Take 1 tablet (500 mg total) by mouth 2 (two) times daily.   pravastatin 10 MG tablet Commonly known as:  PRAVACHOL Take 10 mg by mouth daily.   ranitidine 300 MG tablet Commonly known as:  ZANTAC TAKE 1 TABLET(300 MG) BY MOUTH AT BEDTIME   tamoxifen 20 MG tablet Commonly known as:  NOLVADEX Take 1 tablet (20 mg total) by mouth daily.   temazepam 15 MG capsule Commonly known as:  RESTORIL Take 1 capsule (15 mg total) by mouth at bedtime.   venlafaxine XR 37.5 MG 24 hr capsule Commonly known as:  EFFEXOR-XR TAKE 1 CAPSULE (37.5 MG TOTAL) BY MOUTH DAILY WITH BREAKFAST.      Follow-up Information    Jillian Helper, MD. Schedule an appointment as soon as possible for a visit in 1 week(s).   Specialty:  Family Medicine Why:  Hospital Follow Up and recheck  Contact information: [address], [address] [place] [address] [phone]          Allergies  Allergen Reactions  . Codeine Nausea And Vomiting  . Propoxyphene N-Acetaminophen Other (See Comments)    hallucinations   Allergies as of 05/17/2018      Reactions   Codeine Nausea And Vomiting    Propoxyphene N-acetaminophen Other (See Comments)   hallucinations      Medication List    STOP taking these medications   ibuprofen 600 MG tablet Commonly known as:  ADVIL,MOTRIN   predniSONE 20 MG tablet Commonly known as:  DELTASONE     TAKE these medications   acetaminophen 500 MG tablet Commonly known as:  TYLENOL Take 1,000 mg by mouth every 6 (six) hours as needed for mild pain.   aspirin EC 81 MG tablet Take 1 tablet (81 mg total) by mouth every evening. What changed:  when to take this   benazepril 20 MG tablet Commonly known as:  LOTENSIN Take 1 tablet (20 mg total) by mouth daily. Start taking on:  05/18/2018  What changed:    medication strength  how much to take   blood glucose meter kit and supplies Once daily testing- dispense formulary meter and supplies dx e11.9   cloNIDine 0.1 MG tablet Commonly known as:  CATAPRES Take 1 tablet (0.1 mg total) by mouth 2 (two) times daily. What changed:  when to take this   glucose blood test strip Use as instructed once daily for one touch meter dx e11.9   metFORMIN 500 MG 24 hr tablet Commonly known as:  GLUCOPHAGE-XR Take 1 tablet (500 mg total) by mouth 2 (two) times daily.   pravastatin 10 MG tablet Commonly known as:  PRAVACHOL Take 10 mg by mouth daily.   ranitidine 300 MG tablet Commonly known as:  ZANTAC TAKE 1 TABLET(300 MG) BY MOUTH AT BEDTIME   tamoxifen 20 MG tablet Commonly known as:  NOLVADEX Take 1 tablet (20 mg total) by mouth daily.   temazepam 15 MG capsule Commonly known as:  RESTORIL Take 1 capsule (15 mg total) by mouth at bedtime.   venlafaxine XR 37.5 MG 24 hr capsule Commonly known as:  EFFEXOR-XR TAKE 1 CAPSULE (37.5 MG TOTAL) BY MOUTH DAILY WITH BREAKFAST.       Procedures/Studies: Dg Chest 2 View  Result Date: 05/16/2018 CLINICAL DATA:  Chills EXAM: CHEST - 2 VIEW COMPARISON:  02/17/2016 FINDINGS: Lungs are clear.  No pleural effusion or pneumothorax. The heart is  normal in size. Visualized osseous structures are within normal limits. IMPRESSION: Normal chest radiographs. Electronically Signed   By: Julian Hy M.D.   On: 05/16/2018 17:46   Dg Lumbar Spine Complete  Result Date: 04/28/2018 CLINICAL DATA:  Low back pain with right leg radiculopathy for the past 5 days. EXAM: LUMBAR SPINE - COMPLETE 4+ VIEW COMPARISON:  CT abdomen pelvis dated March 31, 2016. FINDINGS: Five lumbar type vertebral bodies. No acute fracture or subluxation. Vertebral body heights are preserved. New trace anterolisthesis at L4-L5. Mild disc height loss at L4-L5, unchanged. Remaining intervertebral disc spaces are maintained. Mild left facet arthropathy at L3-L4. Moderate right and severe left facet arthropathy at L4-L5. the sacroiliac joints are unremarkable. IMPRESSION: 1. 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 Advanced facet arthropathy at L4-L5 with new trace anterolisthesis. Electronically Signed   By: Titus Dubin M.D.   On: 04/28/2018 15:55   Ct Head Wo Contrast  Result Date: 05/16/2018 CLINICAL DATA:  Headache EXAM: CT HEAD WITHOUT CONTRAST TECHNIQUE: Contiguous axial images were obtained from the base of the skull through the vertex without intravenous contrast. COMPARISON:  None. FINDINGS: Brain: No evidence of acute infarction, hemorrhage, hydrocephalus, extra-axial collection or mass lesion/mass effect. Mild subcortical white matter and periventricular small vessel ischemic changes. Vascular: No hyperdense vessel or unexpected calcification. Skull: Normal. Negative for fracture or focal lesion. Sinuses/Orbits: The visualized paranasal sinuses are essentially clear. The mastoid air cells are unopacified. Other: None. IMPRESSION: No evidence of acute intracranial abnormality. Mild small vessel ischemic changes. Electronically Signed   By: Julian Hy M.D.   On: 05/16/2018 17:27   Mr Brain Wo Contrast  Result Date: 05/17/2018 CLINICAL DATA:  Ataxia with  stroke suspected EXAM: MRI HEAD WITHOUT CONTRAST TECHNIQUE: Multiplanar, multiecho pulse sequences of the brain and surrounding structures were obtained without intravenous contrast. COMPARISON:  Head CT from yesterday FINDINGS: Brain: No acute infarction, hemorrhage, hydrocephalus, extra-axial collection or mass lesion. Overall mild extent of FLAIR hyperintensity in the cerebral white matter, primarily periventricular. Vascular: Major flow voids are preserved Skull and  upper cervical spine: No evidence of marrow lesion Sinuses/Orbits: Bilateral cataract resection. No mastoid or middle ear opacification. IMPRESSION: 1. No acute or reversible finding. 2. Mild chronic small vessel ischemia. Electronically Signed   By: Monte Fantasia M.D.   On: 05/17/2018 08:33   Mr Lumbar Spine Wo Contrast  Result Date: 05/07/2018 CLINICAL DATA:  Bilateral thigh pain for a couple weeks. No known injury. EXAM: MRI LUMBAR SPINE WITHOUT CONTRAST TECHNIQUE: Multiplanar, multisequence MR imaging of the lumbar spine was performed. No intravenous contrast was administered. COMPARISON:  Lumbar spine radiographs 04/28/2018. Abdominal CT 03/31/2016. FINDINGS: Segmentation: Conventional anatomy assumed, with the last open disc space designated L5-S1.This is concordant with previous imaging. Alignment:  Normal. Vertebrae: No worrisome osseous lesion, acute fracture or pars defect. Scattered small hemangiomas. The visualized sacroiliac joints appear unremarkable. Conus medullaris: Extends to the L1 level and appears normal. Paraspinal and other soft tissues: No significant paraspinal findings. Disc levels: No significant disc space findings from T11-12 through L1-2. L2-3: Mild disc bulging and ligamentum flavum thickening. No spinal stenosis or nerve root encroachment. L3-4: Mild disc bulging, facet and ligamentous hypertrophy. No spinal stenosis or nerve root encroachment. L4-5: Mild disc bulging with moderate facet and ligamentous  hypertrophy. There are small bilateral facet joint effusions and a small posteriorly directed synovial cyst on the left. Mild triangulation of the thecal sac and mild narrowing of the left lateral recess. No foraminal compromise or definite nerve root encroachment. L5-S1: Disc height and hydration are maintained. Mild bilateral facet hypertrophy. No spinal stenosis or nerve root encroachment. IMPRESSION: 1. No disc herniation, spinal stenosis or nerve root encroachment. 2. Mild disc bulging as described. Facet hypertrophy, greatest at L4-5. Electronically Signed   By: Richardean Sale M.D.   On: 05/07/2018 15:28   Nm Pulmonary Perf And Vent  Result Date: 05/17/2018 CLINICAL DATA:  77 year old female with ataxia, chills, abnormal D-dimer. EXAM: NUCLEAR MEDICINE VENTILATION - PERFUSION LUNG SCAN TECHNIQUE: Ventilation images were obtained in multiple projections using inhaled aerosol Tc-30mDTPA. Perfusion images were obtained in multiple projections after intravenous injection of Tc-925mAA. RADIOPHARMACEUTICALS:  31.0 mCi of Tc-9998mPA aerosol inhalation and 4.5 mCi Tc99m32m IV COMPARISON:  Chest radiographs 05/16/2018. FINDINGS: Ventilation: No ventilation defect identified. Incidental gastric contamination. Perfusion: Homogeneous radiotracer activity in both lungs. No perfusion defect. IMPRESSION: Negative.  No evidence of acute pulmonary embolus. Electronically Signed   By: H  HGenevie Ann.   On: 05/17/2018 15:13      Subjective: The patient says that she is feeling a lot better and she really wants to go home.    Discharge Exam: Vitals:   05/17/18 0251 05/17/18 1400  BP: (!) 169/78   Pulse: 87   Resp: (!) 23   Temp: 98.9 F (37.2 C)   SpO2: 98% 96%   Vitals:   05/17/18 0130 05/17/18 0200 05/17/18 0251 05/17/18 1400  BP: (!) 166/81 (!) 160/83 (!) 169/78   Pulse: 85 98 87   Resp: (!) 23 (!) 23 (!) 23   Temp:   98.9 F (37.2 C)   TempSrc:   Oral   SpO2: 99% 94% 98% 96%  Weight:       Height:       General: Pt is alert, awake, not in acute distress Cardiovascular: RRR, S1/S2 +, no rubs, no gallops Respiratory: CTA bilaterally, no wheezing, no rhonchi Abdominal: Soft, NT, ND, bowel sounds + Extremities: no edema, no cyanosis Neurological: nonfocal   The results of significant diagnostics from this hospitalization (  including imaging, microbiology, ancillary and laboratory) are listed below for reference.     Microbiology: Recent Results (from the past 240 hour(s))  Culture, blood (Routine X 2) w Reflex to ID Panel     Status: None (Preliminary result)   Collection Time: 05/16/18  4:45 PM  Result Value Ref Range Status   Specimen Description BLOOD LEFT HAND  Final   Special Requests   Final    BOTTLES DRAWN AEROBIC AND ANAEROBIC Blood Culture results may not be optimal due to an inadequate volume of blood received in culture bottles   Culture   Final    NO GROWTH < 24 HOURS Performed at [facility], [address], [place], [place] [address]    Report Status PENDING  Incomplete  Culture, blood (Routine X 2) w Reflex to ID Panel     Status: None (Preliminary result)   Collection Time: 05/16/18  4:47 PM  Result Value Ref Range Status   Specimen Description BLOOD RIGHT HAND  Final   Special Requests   Final    BOTTLES DRAWN AEROBIC AND ANAEROBIC Blood Culture results may not be optimal due to an inadequate volume of blood received in culture bottles   Culture   Final    NO GROWTH < 24 HOURS Performed at [facility], [address] [place], [place] [address]    Report Status PENDING  Incomplete     Labs: BNP (last 3 results) No results for input(s): BNP in the last 8760 hours. Basic Metabolic Panel: Recent Labs  Lab 05/16/18 1439 05/17/18 0552  NA 131* 133*  K 4.1 4.1  CL 95* 99  CO2 27 25  GLUCOSE 122* 182*  BUN 15 11  CREATININE 0.92 0.87  CALCIUM 8.8* 8.4*   Liver Function Tests: Recent Labs  Lab 05/16/18 1647 05/17/18 0552  AST 16 15   ALT 17 15  ALKPHOS 48 41  BILITOT 1.1 0.9  PROT 7.1 6.5  ALBUMIN 3.7 3.1*   No results for input(s): LIPASE, AMYLASE in the last 168 hours. No results for input(s): AMMONIA in the last 168 hours. CBC: Recent Labs  Lab 05/16/18 1439 05/17/18 0552  WBC 13.2* 12.0*  NEUTROABS 8.9*  --   HGB 14.0 12.9  HCT 42.9 39.7  MCV 94.7 95.0  PLT 135* 111*   Cardiac Enzymes: Recent Labs  Lab 05/16/18 1908  TROPONINI <0.03   BNP: Invalid input(s): POCBNP CBG: Recent Labs  Lab 05/16/18 2154 05/17/18 0842 05/17/18 1145  GLUCAP 144* 170* 143*   D-Dimer Recent Labs    05/16/18 1645  DDIMER 1.23*   Hgb A1c Recent Labs    05/17/18 0552  HGBA1C 6.6*   Lipid Profile No results for input(s): CHOL, HDL, LDLCALC, TRIG, CHOLHDL, LDLDIRECT in the last 72 hours. Thyroid function studies No results for input(s): TSH, T4TOTAL, T3FREE, THYROIDAB in the last 72 hours.  Invalid input(s): FREET3 Anemia work up No results for input(s): VITAMINB12, FOLATE, FERRITIN, TIBC, IRON, RETICCTPCT in the last 72 hours. Urinalysis    Component Value Date/Time   COLORURINE YELLOW 05/16/2018 1616   APPEARANCEUR CLEAR 05/16/2018 1616   LABSPEC 1.020 05/16/2018 1616   PHURINE 6.0 05/16/2018 1616   GLUCOSEU NEGATIVE 05/16/2018 1616   HGBUR MODERATE (A) 05/16/2018 1616   HGBUR trace-intact 06/04/2010 0932   BILIRUBINUR NEGATIVE 05/16/2018 1616   BILIRUBINUR negative 08/06/2012 1522   KETONESUR 5 (A) 05/16/2018 1616   PROTEINUR NEGATIVE 05/16/2018 1616   UROBILINOGEN 0.2 08/06/2012 1522   UROBILINOGEN  0.2 12/15/2011 1255   NITRITE NEGATIVE 05/16/2018 1616   LEUKOCYTESUR NEGATIVE 05/16/2018 1616   Sepsis Labs Invalid input(s): PROCALCITONIN,  WBC,  LACTICIDVEN Microbiology Recent Results (from the past 240 hour(s))  Culture, blood (Routine X 2) w Reflex to ID Panel     Status: None (Preliminary result)   Collection Time: 05/16/18  4:45 PM  Result Value Ref Range Status   Specimen  Description BLOOD LEFT HAND  Final   Special Requests   Final    BOTTLES DRAWN AEROBIC AND ANAEROBIC Blood Culture results may not be optimal due to an inadequate volume of blood received in culture bottles   Culture   Final    NO GROWTH < 24 HOURS Performed at [facility], [address], [place], [place] [address]    Report Status PENDING  Incomplete  Culture, blood (Routine X 2) w Reflex to ID Panel     Status: None (Preliminary result)   Collection Time: 05/16/18  4:47 PM  Result Value Ref Range Status   Specimen Description BLOOD RIGHT HAND  Final   Special Requests   Final    BOTTLES DRAWN AEROBIC AND ANAEROBIC Blood Culture results may not be optimal due to an inadequate volume of blood received in culture bottles   Culture   Final    NO GROWTH < 24 HOURS Performed at [facility], [address], [place], [place] [address]    Report Status PENDING  Incomplete   Time coordinating discharge:   SIGNED:  Irwin Brakeman, MD  [facility] 05/17/2018, 3:46 PM Pager [phone]  If 7PM-7AM, please contact night-coverage [email or web address] Password [email or web address]

## 2018-05-17 NOTE — Progress Notes (Signed)
05/16/2018  2:10 PM  05/17/2018 10:41 AM  Jillian [name] was seen and examined.  The H&P by the admitting provider, orders, imaging was reviewed.  Please see new orders.  Awaiting MRI and CT.  Pt wants to go if the tests are negative.  Will follow up.  Could discharge home later today if workup is unremarkable.    Murvin Natal, MD [facility]

## 2018-05-17 NOTE — Care Management Obs Status (Signed)
[facility] NOTIFICATION   Patient Details  Name: Jillian [name] MRN: [id number] Date of Birth: [birth year]   Medicare Observation Status Notification Given:  Yes    Sherald Barge, RN 05/17/2018, 8:54 AM

## 2018-05-18 ENCOUNTER — Telehealth: Payer: Self-pay

## 2018-05-18 ENCOUNTER — Ambulatory Visit (INDEPENDENT_AMBULATORY_CARE_PROVIDER_SITE_OTHER): Payer: Medicare HMO | Admitting: Family Medicine

## 2018-05-18 ENCOUNTER — Encounter: Payer: Self-pay | Admitting: Family Medicine

## 2018-05-18 VITALS — BP 140/80 | HR 88 | Ht 64.0 in | Wt 185.0 lb

## 2018-05-18 DIAGNOSIS — R51 Headache: Secondary | ICD-10-CM | POA: Diagnosis not present

## 2018-05-18 DIAGNOSIS — Z09 Encounter for follow-up examination after completed treatment for conditions other than malignant neoplasm: Secondary | ICD-10-CM | POA: Diagnosis not present

## 2018-05-18 DIAGNOSIS — I10 Essential (primary) hypertension: Secondary | ICD-10-CM | POA: Diagnosis not present

## 2018-05-18 DIAGNOSIS — R519 Headache, unspecified: Secondary | ICD-10-CM | POA: Insufficient documentation

## 2018-05-18 DIAGNOSIS — G8929 Other chronic pain: Secondary | ICD-10-CM

## 2018-05-18 NOTE — Assessment & Plan Note (Signed)
Uncontrolled , continue higher dose of ACE F/U early October

## 2018-05-18 NOTE — Assessment & Plan Note (Signed)
Neurology as out patient follow up asap. Has new h/o unsteady gait, confusion and disabling sciatic a in the past 3 weeks when she acutely decompensated

## 2018-05-18 NOTE — Patient Instructions (Addendum)
F/u as  Before, call if you need me sooner   You are referred urgently to a neurologist in [place] to treat uncontrolled headache  We will call your son, Delilah Shan, on trhe cell phone [phone] later today with appt info  PLEASE have your sister Armanda Magic and son sign tomhave health contact todaY

## 2018-05-18 NOTE — Progress Notes (Signed)
   Jillian [name]     MRN: [id number]      DOB: 1941/07/19   HPI Ms. Bones is here for follow up of hospitalization from 8/25 to 05/17/2018 when she was admitted with unrellenting headache , copnfusion and unsteady gait, which she has c/o over the past 3 weeks . Today she is in 1 day after discharge still c/o frontal headach rated at an 8, she denies weakness, loss of vision, or loss of strength.Currentlu walking with a walker , and reports the  Back pain is much improved Appetite is not the best and fluctuates Her sister states she was in her usual state of health up to 3 weeks ago when she first started c/o low back and RLE pain, and was exposed to 3 different types of medication in rapid succession [name] denies anxiety or increased stress or depression Although at the time of her discharge she reported being headache free, this has not been the case once home until now, continues to c/o frontal headache  ROS See HPI  Denies recent fever has had some chills. Denies , nasal congestion, ear pain or sore throat. Denies chest congestion, productive cough or wheezing. Denies chest pains, palpitations and leg swelling Denies abdominal pain,c/o intermittent  Nausea and  Vomiting denies ,diarrhea or constipation.   Denies dysuria, frequency, hesitancy or incontinence. Denies uncontrolled  joint pain, swelling and limitation in mobility. Denies , seizures, numbness, or tingling. Denies depression, anxiety c/o chronic  insomnia. Denies skin break down or rash.   PE  BP 140/80   Pulse 88   Ht 5\' 4"  (1.626 m)   Wt 185 lb (83.9 kg)   SpO2 97%   BMI 31.76 kg/m   Patient alert and oriented and in no cardiopulmonary distress.  HEENT: No facial asymmetry, EOMI,   oropharynx pink and moist.  Neck decreased though adequate ROM no JVD, no mass.  Chest: Clear to auscultation bilaterally.  CVS: S1, S2 no murmurs, no S3.Regular rate.  ABD: Soft non tender.   Ext: No edema  MS:  decreased  ROM spine, adequate in shoulders, hips and knees.  Skin: Intact, no ulcerations or rash noted.  Psych: Good eye contact, flat  affect. Memory intact not anxious or depressed appearing.  CNS: CN 2-12 intact, power,  normal throughout.no focal deficits noted.   [facility] discharge follow-up No headache relief 1 day post discharge,  No underlying pathology established. Since pt had a deterioration in her course in past 3 weeks with  Multiple different medications, needs urgent neurology follow up for headache management Family and pt are in agreement Hospital course is reviewed and questions answered Needs cBC and chem 7 in next 10 to 14 days  ESSENTIAL HYPERTENSION, BENIGN Uncontrolled , continue higher dose of ACE F/U early October  Intractable headache Neurology as out patient follow up asap. Has new h/o unsteady gait, confusion and disabling sciatic a in the past 3 weeks when she acutely decompensated

## 2018-05-18 NOTE — Assessment & Plan Note (Addendum)
No headache relief 1 day post discharge,  No underlying pathology established. Since pt had a deterioration in her course in past 3 weeks with  Multiple different medications, needs urgent neurology follow up for headache management Family and pt are in agreement Hospital course is reviewed and questions answered Needs cBC and chem 7 in next 10 to 14 days

## 2018-05-18 NOTE — Telephone Encounter (Signed)
Transition Care Management Follow-up Telephone Call   Date discharged?      05/17/18          How have you been since you were released from the hospital?  ok, but my head is still bad   Do you understand why you were in the hospital? I went there for a head ache but they said my blood pressure was too high   Do you understand the discharge instructions?es   Where were you discharged to? home   Items Reviewed:  Medications reviewed: yes  Allergies reviewed: yes  Dietary changes reviewed: yes  Referrals reviewed: yes   Functional Questionnaire:   Activities of Daily Living (ADLs):  has help if she needs it    Any transportation issues/concerns?: no   Any patient concerns? no   Confirmed importance and date/time of follow-up visits scheduled will send front staff a message to schedule this patient a TOC appt. Patient is aware.      Confirmed with patient if condition begins to worsen call PCP or go to the ER.  Patient was given the office number and encouraged to call back with question or concerns.  yes with verbal understanding.

## 2018-05-19 ENCOUNTER — Encounter: Payer: Self-pay | Admitting: Neurology

## 2018-05-19 ENCOUNTER — Encounter (HOSPITAL_COMMUNITY): Payer: Self-pay

## 2018-05-19 ENCOUNTER — Emergency Department (HOSPITAL_COMMUNITY): Payer: Medicare HMO

## 2018-05-19 ENCOUNTER — Emergency Department (HOSPITAL_COMMUNITY)
Admission: EM | Admit: 2018-05-19 | Discharge: 2018-05-19 | Disposition: A | Discharge status: Home / Self Care | Payer: Medicare HMO | Attending: Emergency Medicine | Admitting: Emergency Medicine

## 2018-05-19 ENCOUNTER — Other Ambulatory Visit: Payer: Self-pay

## 2018-05-19 ENCOUNTER — Ambulatory Visit: Payer: Medicare HMO | Admitting: Neurology

## 2018-05-19 VITALS — BP 138/79 | HR 98 | Ht 64.0 in | Wt 183.0 lb

## 2018-05-19 DIAGNOSIS — Z79899 Other long term (current) drug therapy: Secondary | ICD-10-CM | POA: Insufficient documentation

## 2018-05-19 DIAGNOSIS — R4182 Altered mental status, unspecified: Secondary | ICD-10-CM

## 2018-05-19 DIAGNOSIS — Z7982 Long term (current) use of aspirin: Secondary | ICD-10-CM | POA: Diagnosis not present

## 2018-05-19 DIAGNOSIS — R51 Headache: Secondary | ICD-10-CM | POA: Diagnosis not present

## 2018-05-19 DIAGNOSIS — I10 Essential (primary) hypertension: Secondary | ICD-10-CM | POA: Diagnosis not present

## 2018-05-19 DIAGNOSIS — E119 Type 2 diabetes mellitus without complications: Secondary | ICD-10-CM | POA: Insufficient documentation

## 2018-05-19 DIAGNOSIS — Z9289 Personal history of other medical treatment: Secondary | ICD-10-CM | POA: Diagnosis not present

## 2018-05-19 DIAGNOSIS — G4489 Other headache syndrome: Secondary | ICD-10-CM

## 2018-05-19 DIAGNOSIS — Z7984 Long term (current) use of oral hypoglycemic drugs: Secondary | ICD-10-CM | POA: Diagnosis not present

## 2018-05-19 DIAGNOSIS — R4020 Unspecified coma: Secondary | ICD-10-CM | POA: Diagnosis not present

## 2018-05-19 DIAGNOSIS — R519 Headache, unspecified: Secondary | ICD-10-CM

## 2018-05-19 LAB — COMPREHENSIVE METABOLIC PANEL
ALBUMIN: 3.5 g/dL (ref 3.5–5.0)
ALT: 19 U/L (ref 0–44)
ANION GAP: 11 (ref 5–15)
AST: 17 U/L (ref 15–41)
Alkaline Phosphatase: 44 U/L (ref 38–126)
BUN: 14 mg/dL (ref 8–23)
CHLORIDE: 95 mmol/L — AB (ref 98–111)
CO2: 24 mmol/L (ref 22–32)
Calcium: 8.6 mg/dL — ABNORMAL LOW (ref 8.9–10.3)
Creatinine, Ser: 1.17 mg/dL — ABNORMAL HIGH (ref 0.44–1.00)
GFR calc Af Amer: 51 mL/min — ABNORMAL LOW (ref >60)
GFR calc non Af Amer: 44 mL/min — ABNORMAL LOW (ref >60)
Glucose, Bld: 139 mg/dL — ABNORMAL HIGH (ref 70–99)
Potassium: 3.8 mmol/L (ref 3.5–5.1)
SODIUM: 130 mmol/L — AB (ref 135–145)
Total Bilirubin: 0.9 mg/dL (ref 0.3–1.2)
Total Protein: 7.1 g/dL (ref 6.5–8.1)

## 2018-05-19 LAB — URINALYSIS, ROUTINE W REFLEX MICROSCOPIC
BILIRUBIN URINE: NEGATIVE
GLUCOSE, UA: NEGATIVE mg/dL
KETONES UR: NEGATIVE mg/dL
LEUKOCYTES UA: NEGATIVE
Nitrite: NEGATIVE
PROTEIN: NEGATIVE mg/dL
Specific Gravity, Urine: 1.015 (ref 1.005–1.030)
pH: 6 (ref 5.0–8.0)

## 2018-05-19 LAB — CBC WITH DIFFERENTIAL/PLATELET
BASOS PCT: 0 %
Basophils Absolute: 0 10*3/uL (ref 0.0–0.1)
EOS ABS: 0 10*3/uL (ref 0.0–0.7)
EOS PCT: 0 %
HCT: 39.3 % (ref 36.0–46.0)
Hemoglobin: 12.5 g/dL (ref 12.0–15.0)
LYMPHS ABS: 2 10*3/uL (ref 0.7–4.0)
Lymphocytes Relative: 17 %
MCH: 30.5 pg (ref 26.0–34.0)
MCHC: 31.8 g/dL (ref 30.0–36.0)
MCV: 95.9 fL (ref 78.0–100.0)
Monocytes Absolute: 1.7 10*3/uL — ABNORMAL HIGH (ref 0.1–1.0)
Monocytes Relative: 14 %
Neutro Abs: 8.3 10*3/uL — ABNORMAL HIGH (ref 1.7–7.7)
Neutrophils Relative %: 69 %
PLATELETS: 124 10*3/uL — AB (ref 150–400)
RBC: 4.1 MIL/uL (ref 3.87–5.11)
RDW: 14.2 % (ref 11.5–15.5)
WBC: 11.9 10*3/uL — ABNORMAL HIGH (ref 4.0–10.5)

## 2018-05-19 MED ORDER — GADOBENATE DIMEGLUMINE 529 MG/ML IV SOLN
17.0000 mL | Freq: Once | INTRAVENOUS | Status: AC | PRN
  Administered 2018-05-19: 17 mL via INTRAVENOUS

## 2018-05-19 NOTE — Progress Notes (Signed)
Subjective:    Patient ID: Jillian [name] is a 77 y.o. female.  HPI     Star Age, MD, PhD [facility] [address] [place], [place] [address]  Dear Dr. Moshe Cipro,   I saw your patient, Jillian [name], upon your kind request, in my neurologic clinic today for initial consultation of her recurrent headaches of recent onset. The patient is accompanied by her son and her sister today. As you know, Jillian [name] is a 77 year old right-handed woman with an underlying medical history of diabetes, hypertension, hyperlipidemia, degenerative disc disease and obesity, who reports a new onset headache which seems to stay in the frontal areas. I reviewed your office note from 05/18/2018. She was recently hospitalized for headache associated with confusion. She was admitted on 05/16/2018 and discharged on 05/17/2018. I reviewed the hospital records. She had a head CT without contrast on 05/16/2018 and I reviewed the results: IMPRESSION: No evidence of acute intracranial abnormality.   Mild small vessel ischemic changes.   She had a brain MRI without contrast on 05/17/2018 and I reviewed the results: IMPRESSION: 1. No acute or reversible finding. 2. Mild chronic small vessel ischemia. Her blood pressure was elevated and her ramipril and clonidine were increased during hospital stay. Percent, her headaches started 2 weeks ago and was gradual in onset, she reports a bifrontal headache, no associated photophobia currently but she did have an episode of nausea and vomiting some 2 weeks ago. Her history is primarily provided by her son. The patient is not able to provide a detailed history by herself. She seems sleepy or lethargic at times, she is slow in answering. She is moving slowly, he reports that she is complaining of leg pain bilaterally. She is normally independent, mobilizes independently, drives etc. Since her hospitalization she has been using a 2 wheeled  walker. She is at fall risk. She has a tendency to fall backwards. She is able to control her bladder and bowel function. She has a reasonable appetite, less than before. As far as he recalls, her blood pressure was somewhat elevated when she first got to the hospital but not severely increased, he remembers 160s over 80s as her highest blood pressure in the emergency room. She is confused, she is sleepy during the day. She has poor sleep at night. She had a sleep study some 10 years ago and was told she does not have sleep apnea. She has no family history of OSA. She snores mildly per sister. She had blood work in the hospital, she had a mildly elevated [name] cell count. She had a urinalysis which was negative for a UTI. Chest x-ray was unremarkable. She lives alone, she is independent in her ADLs typically about her son has been staying with her since she was discharged from the hospital. She was recently treated with Flexeril and prednisone but has not had any in the past 2 weeks. She has been on low-dose Effexor, there is no concern for accidental overdose, there is no opiate medication in the house. She does not typically have headaches, no history of migraines in the past.  Her Past Medical History Is Significant For: Past Medical History:  Diagnosis Date  . Chronic fatigue   . Diabetes mellitus 2011  . DJD (degenerative joint disease)    of neck    . Dyslipidemia   . Hypertension 2005  . Mild obesity   . Postmenopausal   . Wears glasses  Her Past Surgical History Is Significant For: Past Surgical History:  Procedure Laterality Date  . ABDOMINAL HYSTERECTOMY  1992   fibroids  . APPENDECTOMY    . BREAST EXCISIONAL BIOPSY Right    benign  . BREAST LUMPECTOMY WITH RADIOACTIVE SEED LOCALIZATION Right 10/05/2014   Procedure: BREAST LUMPECTOMY WITH RADIOACTIVE SEED LOCALIZATION;  Surgeon: Erroll Luna, MD;  Location: [facility];  Service: General;  Laterality: Right;  .  cataract Bilateral   . CYST EXCISION Right    arm  . EYE SURGERY Right 06/07/2013   cataract  . EYE SURGERY Left 09/14/2013   cataract  . KNEE ARTHROSCOPY Right 02/26/2016   Procedure: ARTHROSCOPY RIGHT KNEE WITH MENSICAL DEBRIDEMENT;  Surgeon: Gaynelle Arabian, MD;  Location: [facility];  Service: Orthopedics;  Laterality: Right;  . left breast biopsy    . LESION REMOVAL Right 03/30/2013   Procedure: EXCISION OF NEOPLASM ARM;  Surgeon: Jamesetta So, MD;  Location: [facility];  Service: General;  Laterality: Right;  . TUBAL LIGATION      Her Family History Is Significant For: Family History  Problem Relation Age of Onset  . Hypertension Father   . Congestive Heart Failure Father   . Liver cancer Mother   . Hypertension Sister   . Hypertension Brother   . Diabetes Brother     Her Social History Is Significant For: Social History   Socioeconomic History  . Marital status: Widowed    Spouse name: Not on file  . Number of children: Not on file  . Years of education: Not on file  . Highest education level: Not on file  Occupational History  . Not on file  Social Needs  . Financial resource strain: Not hard at all  . Food insecurity:    Worry: Never true    Inability: Never true  . Transportation needs:    Medical: No    Non-medical: No  Tobacco Use  . Smoking status: Never Smoker  . Smokeless tobacco: Never Used  Substance and Sexual Activity  . Alcohol use: No  . Drug use: No  . Sexual activity: Not Currently    Birth control/protection: Surgical  Lifestyle  . Physical activity:    Days per week: 0 days    Minutes per session: 0 min  . Stress: Not at all  Relationships  . Social connections:    Talks on phone: More than three times a week    Gets together: Once a week    Attends religious service: More than 4 times per year    Active member of club or organization: Yes    Attends meetings of clubs or organizations: More than 4 times per year    Relationship status:  Widowed  Other Topics Concern  . Not on file  Social History Narrative  . Not on file    Her Allergies Are:  Allergies  Allergen Reactions  . Codeine Nausea And Vomiting  . Propoxyphene N-Acetaminophen Other (See Comments)    hallucinations  :   Her Current Medications Are:  Outpatient Encounter Medications as of 05/19/2018  Medication Sig  . acetaminophen (TYLENOL) 500 MG tablet Take 1,000 mg by mouth every 6 (six) hours as needed for mild pain.  Marland Kitchen aspirin EC 81 MG tablet Take 1 tablet (81 mg total) by mouth every evening. (Patient taking differently: Take 81 mg by mouth every morning. )  . benazepril (LOTENSIN) 20 MG tablet Take 1 tablet (20 mg total) by mouth daily.  Marland Kitchen  blood glucose meter kit and supplies Once daily testing- dispense formulary meter and supplies dx e11.9  . cloNIDine (CATAPRES) 0.1 MG tablet Take 1 tablet (0.1 mg total) by mouth 2 (two) times daily.  Marland Kitchen glucose blood test strip Use as instructed once daily for one touch meter dx e11.9  . metFORMIN (GLUCOPHAGE-XR) 500 MG 24 hr tablet Take 1 tablet (500 mg total) by mouth 2 (two) times daily.  . pravastatin (PRAVACHOL) 10 MG tablet Take 10 mg by mouth daily.  . ranitidine (ZANTAC) 300 MG tablet TAKE 1 TABLET(300 MG) BY MOUTH AT BEDTIME  . tamoxifen (NOLVADEX) 20 MG tablet Take 1 tablet (20 mg total) by mouth daily.  . temazepam (RESTORIL) 15 MG capsule Take 1 capsule (15 mg total) by mouth at bedtime.  Marland Kitchen venlafaxine XR (EFFEXOR-XR) 37.5 MG 24 hr capsule TAKE 1 CAPSULE (37.5 MG TOTAL) BY MOUTH DAILY WITH BREAKFAST.   No facility-administered encounter medications on file as of 05/19/2018.   :  Review of Systems:  Out of a complete 14 point review of systems, all are reviewed and negative with the exception of these symptoms as listed below:  Review of Systems  Neurological:       Pt presents today to discuss her daily headaches. Pt was told it could be her BP causing her headaches but her family disagrees. Pt has  had a slow gait the past week or two because she is having leg pain. Pt's family reports that over the past 1-2 weeks, pt seems to have a hard time gathering her thoughts and can appear dazed. Pt has a poor appetite. Pt has had a sleep study about 10 years ago and reports that it was normal. Pt does endorse snoring.  Epworth Sleepiness Scale 0= would never doze 1= slight chance of dozing 2= moderate chance of dozing 3= high chance of dozing  Sitting and reading: 0 Watching TV: 0 Sitting inactive in a public place (ex. Theater or meeting): 0 As a passenger in a car for an hour without a break: 0 Lying down to rest in the afternoon: 1 Sitting and talking to someone: 0 Sitting quietly after lunch (no alcohol): 0 In a car, while stopped in traffic: 0 Total: 1     Objective:  Neurological Exam  Physical Exam Physical Examination:   Vitals:   05/19/18 0930  BP: 138/79  Pulse: 98    General Examination: The patient is a very pleasant 77 y.o. female in mild distress. She is slow in responding, seems sleepy at times, eyelids are droopy at times which is not typical for her. She is unable to provide a detailed history. She is cooperative with the exam, denies photophobia, nausea, reports mild bifrontal headache.  HEENT: Normocephalic, atraumatic, pupils are equal, round and reactive to light and accommodation. Funduscopic exam is normal with sharp disc margins noted. Extraocular tracking is fair, face is symmetric, hearing grossly intact, she has no significant focal rigidity. She has good range of motion in her neck, airway examination reveals mild airway crowding secondary to smaller airway entry and redundant soft palate, tonsils are small or ?absent. She denies tonsillectomy. Tongue protrudes centrally and palate elevates symmetrically. Speech is not dysarthric but slow and scant. No carotid bruits.  Chest: Clear to auscultation without wheezing, rhonchi or crackles noted.  Heart:  S1+S2+0, regular and normal without murmurs, rubs or gallops noted.   Abdomen: Soft, non-tender and non-distended with normal bowel sounds appreciated on auscultation.  Extremities: There is no  pitting edema in the distal lower extremities bilaterally. Pedal pulses are intact.  Skin: Warm and dry without trophic changes noted.  Musculoskeletal: exam reveals no obvious joint deformities, tenderness or joint swelling or erythema.   Neurologically:  Mental status: The patient is awake, not fully alert, paying his attention, is cooperative, is oriented to self and situation. She is unable to give her own history. Slowness in thinking, almost lethargic appearing. Cranial nerves II - XII are as described above under HEENT exam. In addition: shoulder shrug is normal with equal shoulder height noted. Motor exam: Normal bulk, Global strength of 4-5, she has proximal leg pain and reports radiating pain from the groin into the mid thigh areas medially. Romberg is not testable safely. Reflexes are 1-2+ throughout. Fine motor skills and coordination: globally impaired.  Cerebellar testing: No dysmetria or intention tremor, but slow movements.  Sensory exam: intact to light touch.  Gait, station and balance: she stands with difficulty, requires assistance from both sides, walks only with assistance or her walker, slowly and cautiously, slightly wider based, initially has a tendency to veer backwards.  Assessment and Plan:  In summary, Jillian [name] is a very pleasant 77 y.o.-year old female with an underlying medical history of diabetes, hypertension, hyperlipidemia, degenerative disc disease and obesity, Who presents for evaluation of her new onset headaches of 2 weeks duration. She reports a bifrontal headache. In addition, she has had confusion, disorientation, lethargy and weakness her family report. She has no prior history of headaches or migraines. She had recent hospitalization at which time she was  noted to have elevated blood pressure values and her medication was increased for that reason. She had a head CT and brain MRI which ruled out stroke. Nevertheless, given her mental status changes and mild global weakness, evidence of elevated [name] cell count, I would recommend urgent workup for her altered mental status. I did explain this to the patient and her family who are agreeable to taking her back to the emergency room. Viral meningitis could very well be in the differential diagnosis. I doubt that this is bacterial meningitis as she does not have any nuchal rigidity or fevers, she did have some chills and one episode of nausea and vomiting about 2 weeks ago. I did go ahead and order an EEG, brain MRI with and without contrast and a sleep study to rule out obstructive sleep apnea as a potential cause for her headaches. Nevertheless, more urgent workup is indicated at this point. Her son is in agreement.  I answered all their questions today and the patient and her family were in agreement with the above outlined plan.  Thank you very much for allowing me to participate in the care of this nice patient. If I can be of any further assistance to you please do not hesitate to call me at [phone]. I spent 60 minutes in total face-to-face time with the patient, more than 50% of which was spent in counseling and coordination of care, reviewing test results, reviewing medication and discussing or reviewing the diagnosis of AMS, HAs, the prognosis and treatment options. Pertinent laboratory and imaging test results that were available during this visit with the patient were reviewed by me and considered in my medical decision making (see chart for details).    Sincerely,   Star Age, MD, PhD

## 2018-05-19 NOTE — ED Notes (Signed)
Pt in MRI at this time 

## 2018-05-19 NOTE — Discharge Instructions (Addendum)
Follow-up closely with your neurologist.  Return for worsening symptoms, fever, weakness or for any concerns.

## 2018-05-19 NOTE — ED Provider Notes (Signed)
[facility] EMERGENCY DEPARTMENT Provider Note   CSN: [id number] Arrival date & time: 05/19/18  1215     History   Chief Complaint Chief Complaint  Patient presents with  . Headache    HPI Jillian [name] is a 77 y.o. female.  HPI Patient has had frontal headache for several weeks.  Gradual in onset.  Patient also has had some increased confusion and drowsiness.  Was admitted several days ago.  Had work-up including CT head and MRI brain that acute findings.  Initially had mild elevation in lactic acid and white blood cell count.  Was discharged and followed up with neurology today.  Was referred back to the emergency department for further work-up including MRI with and without contrast.  Patient states that her frontal headache was worse this morning but has since subsided.  Denies any nausea or vomiting.  She has been using a walker for ambulation. Past Medical History:  Diagnosis Date  . Chronic fatigue   . Diabetes mellitus 2011  . DJD (degenerative joint disease)    of neck    . Dyslipidemia   . Hypertension 2005  . Mild obesity   . Postmenopausal   . Wears glasses     Patient Active Problem List   Diagnosis Date Noted  . Hospital discharge follow-up 05/18/2018  . Intractable headache 05/18/2018  . Dizziness 05/17/2018  . Confusion   . Ataxia   . Diabetes mellitus type 2 in obese ([place]) 05/06/2018  . Back pain with right-sided radiculopathy 04/28/2018  . Longitudinal split nail 02/15/2018  . Bursitis of deltoid, left 09/09/2017  . Chronic left SI joint pain 05/21/2016  . Primary osteoarthritis of both knees 06/26/2015  . 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 Thoracic spine pain 09/28/2014  . Hip pain 08/06/2012  . 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 Hot flashes, menopausal 09/10/2011  . 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 INSOMNIA, CHRONIC 11/28/2009  . Hyperlipidemia with target LDL less than 100  10/12/2007  . Obesity (BMI 30.0-34.9) 10/12/2007  . ESSENTIAL HYPERTENSION, BENIGN 10/12/2007    Past Surgical History:  Procedure Laterality Date  . ABDOMINAL HYSTERECTOMY  1992   fibroids  . APPENDECTOMY    . BREAST EXCISIONAL BIOPSY Right    benign  . BREAST LUMPECTOMY WITH RADIOACTIVE SEED LOCALIZATION Right 10/05/2014   Procedure: BREAST LUMPECTOMY WITH RADIOACTIVE SEED LOCALIZATION;  Surgeon: Erroll Luna, MD;  Location: [facility];  Service: General;  Laterality: Right;  . cataract Bilateral   . CYST EXCISION Right    arm  . EYE SURGERY Right 06/07/2013   cataract  . EYE SURGERY Left 09/14/2013   cataract  . KNEE ARTHROSCOPY Right 02/26/2016   Procedure: ARTHROSCOPY RIGHT KNEE WITH MENSICAL DEBRIDEMENT;  Surgeon: Gaynelle Arabian, MD;  Location: [facility];  Service: Orthopedics;  Laterality: Right;  . left breast biopsy    . LESION REMOVAL Right 03/30/2013   Procedure: EXCISION OF NEOPLASM ARM;  Surgeon: Jamesetta So, MD;  Location: [facility];  Service: General;  Laterality: Right;  . TUBAL LIGATION       OB History    Gravida  2   Para  2   Term  2   Preterm      AB      Living        SAB      TAB      Ectopic      Multiple  Live Births               Home Medications    Prior to Admission medications   Medication Sig Start Date End Date Taking? Authorizing Provider  acetaminophen (TYLENOL) 500 MG tablet Take 1,000 mg by mouth every 6 (six) hours as needed for mild pain.   Yes [provider]  aspirin EC 81 MG tablet Take 1 tablet (81 mg total) by mouth every evening. Patient taking differently: Take 81 mg by mouth every morning.  04/23/16  Yes Fayrene Helper, MD  benazepril (LOTENSIN) 20 MG tablet Take 1 tablet (20 mg total) by mouth daily. 05/18/18 06/17/18 Yes Johnson, Clanford L, MD  blood glucose meter kit and supplies Once daily testing- dispense formulary meter and supplies dx e11.9 05/04/18  Yes Fayrene Helper,  MD  cloNIDine (CATAPRES) 0.1 MG tablet Take 1 tablet (0.1 mg total) by mouth 2 (two) times daily. 05/17/18  Yes Johnson, Clanford L, MD  glucose blood test strip Use as instructed once daily for one touch meter dx e11.9 05/04/18  Yes Fayrene Helper, MD  metFORMIN (GLUCOPHAGE-XR) 500 MG 24 hr tablet Take 1 tablet (500 mg total) by mouth 2 (two) times daily. 09/09/17  Yes Fayrene Helper, MD  pravastatin (PRAVACHOL) 10 MG tablet Take 10 mg by mouth daily.   Yes [provider]  ranitidine (ZANTAC) 300 MG tablet TAKE 1 TABLET(300 MG) BY MOUTH AT BEDTIME 04/28/18  Yes Fayrene Helper, MD  tamoxifen (NOLVADEX) 20 MG tablet Take 1 tablet (20 mg total) by mouth daily. 04/30/18  Yes Higgs, Mathis Dad, MD  temazepam (RESTORIL) 15 MG capsule Take 1 capsule (15 mg total) by mouth at bedtime. 04/28/18  Yes Fayrene Helper, MD  venlafaxine XR (EFFEXOR-XR) 37.5 MG 24 hr capsule TAKE 1 CAPSULE (37.5 MG TOTAL) BY MOUTH DAILY WITH BREAKFAST. 04/07/18  Yes Fayrene Helper, MD    Family History Family History  Problem Relation Age of Onset  . Hypertension Father   . Congestive Heart Failure Father   . Liver cancer Mother   . Hypertension Sister   . Hypertension Brother   . Diabetes Brother     Social History Social History   Tobacco Use  . Smoking status: Never Smoker  . Smokeless tobacco: Never Used  Substance Use Topics  . Alcohol use: No  . Drug use: No     Allergies   Codeine and Propoxyphene n-acetaminophen   Review of Systems Review of Systems  Constitutional: Positive for fatigue. Negative for chills and fever.  HENT: Negative for facial swelling, sinus pressure, sore throat and trouble swallowing.   Eyes: Negative for photophobia and visual disturbance.  Respiratory: Negative for cough and shortness of breath.   Cardiovascular: Negative for chest pain.  Gastrointestinal: Negative for abdominal pain, nausea and vomiting.  Genitourinary: Negative for dysuria, flank  pain and frequency.  Musculoskeletal: Positive for gait problem. Negative for back pain, myalgias and neck pain.  Skin: Negative for rash and wound.  Neurological: Positive for headaches. Negative for dizziness, weakness, light-headedness and numbness.  Psychiatric/Behavioral: Positive for confusion.  All other systems reviewed and are negative.    Physical Exam Updated Vital Signs BP (!) 155/83   Pulse 62   Temp 97.7 F (36.5 C) (Oral)   Resp 18   SpO2 97%   Physical Exam  Constitutional: She is oriented to person, place, and time. She appears well-developed and well-nourished. No distress.  HENT:  Head: Normocephalic  and atraumatic.  Mouth/Throat: Oropharynx is clear and moist.  Mild frontal sinus tenderness to palpation.  No temporal tenderness.  Oropharynx is clear.  Eyes: Pupils are equal, round, and reactive to light. EOM are normal.  Neck: Normal range of motion. Neck supple.  No meningismus.  No cervical lymphadenopathy.  Cardiovascular: Normal rate and regular rhythm. Exam reveals no gallop and no friction rub.  No murmur heard. Pulmonary/Chest: Effort normal. She has rales.  Few rales appreciated in the right lung field.  No respiratory distress.  Abdominal: Soft. Bowel sounds are normal. There is no tenderness. There is no rebound and no guarding.  Musculoskeletal: Normal range of motion. She exhibits no edema or tenderness.  No lower extremity swelling, asymmetry or tenderness.  Distal pulses are 2+.  Neurological: She is alert and oriented to person, place, and time.  Patient is awake but mildly drowsy appearing.  She is oriented x3 with clear, goal oriented speech. Patient has 5/5 motor in all extremities. Sensation is intact to light touch. Bilateral finger-to-nose is normal with no signs of dysmetria.  Skin: Skin is warm and dry. Capillary refill takes less than 2 seconds. No rash noted. She is not diaphoretic. No erythema.  Psychiatric: She has a normal mood and  affect. Her behavior is normal.  Nursing note and vitals reviewed.    ED Treatments / Results  Labs (all labs ordered are listed, but only abnormal results are displayed) Labs Reviewed  CBC WITH DIFFERENTIAL/PLATELET - Abnormal; Notable for the following components:      Result Value   WBC 11.9 (*)    Platelets 124 (*)    Neutro Abs 8.3 (*)    Monocytes Absolute 1.7 (*)    All other components within normal limits  COMPREHENSIVE METABOLIC PANEL - Abnormal; Notable for the following components:   Sodium 130 (*)    Chloride 95 (*)    Glucose, Bld 139 (*)    Creatinine, Ser 1.17 (*)    Calcium 8.6 (*)    GFR calc non Af Amer 44 (*)    GFR calc Af Amer 51 (*)    All other components within normal limits  URINALYSIS, ROUTINE W REFLEX MICROSCOPIC - Abnormal; Notable for the following components:   APPearance HAZY (*)    Hgb urine dipstick MODERATE (*)    Bacteria, UA RARE (*)    All other components within normal limits    EKG None  Radiology Mr Jeri Cos And Wo Contrast  Result Date: 05/19/2018 CLINICAL DATA:  Altered level of consciousness. The patient was recently hospitalized for headaches and mental status changes. She was seen in the neurology clinic this morning. EXAM: MRI HEAD WITHOUT AND WITH CONTRAST TECHNIQUE: Multiplanar, multiecho pulse sequences of the brain and surrounding structures were obtained without and with intravenous contrast. CONTRAST:  2m MULTIHANCE GADOBENATE DIMEGLUMINE 529 MG/ML IV SOLN COMPARISON:  MRI brain 05/17/2018 FINDINGS: Brain: Stable mild periventricular white matter changes are present bilaterally. T2 hyperintensity is evident along the lateral margins of the lateral and third ventricles. Other patchy white matter changes are present in the corona radiata bilaterally. No significant atrophy is present. The cerebellum is within normal limits. The brainstem and cerebellum are within normal limits otherwise. Postcontrast images demonstrate no  pathologic enhancement. There is no meningeal thickening or enhancement. Vascular: Flow is present in the major intracranial arteries. Skull and upper cervical spine: The skull base is within normal limits. Craniocervical junction is normal. Sinuses/Orbits: The paranasal sinuses and mastoid  air cells are clear. Bilateral lens replacements are present. Globes and orbits are within normal limits. IMPRESSION: 1. Stable mild periventricular and subcortical white matter disease. This likely reflects the sequela of chronic microvascular ischemia in this patient with diabetes, hyperlipidemia, and hypertension. 2. No pathologic enhancement to suggest infection or inflammation. Electronically Signed   By: San Morelle M.D.   On: 05/19/2018 15:40    Procedures Procedures (including critical care time)  Medications Ordered in ED Medications  gadobenate dimeglumine (MULTIHANCE) injection 17 mL (17 mLs Intravenous Contrast Given 05/19/18 1527)     Initial Impression / Assessment and Plan / ED Course  I have reviewed the triage vital signs and the nursing notes.  Pertinent labs & imaging results that were available during my care of the patient were reviewed by me and considered in my medical decision making (see chart for details).     Patient remains at her baseline.  Headache has all but resolved.  No definite meningeal symptoms.  Improving white blood cell count.  MRI with and without contrast with no acute findings.  Discussed with patient and family that if this is viral meningitis, that with her improving symptoms do not believe that further emergent testing is necessary at this point.  She is to follow-up closely with her neurologist.  If she has any worsening symptoms or concerns she needs to be seen in the emergency department.  Final Clinical Impressions(s) / ED Diagnoses   Final diagnoses:  Other headache syndrome  Hypertension, unspecified type    ED Discharge Orders    None        Julianne Rice, MD 05/19/18 1705

## 2018-05-19 NOTE — ED Triage Notes (Addendum)
Pt was discharged from hospital on the 26th. Pt had been seen for HA. Pt has had HA since last last Thursday. Seen By neuro today and sent her for MRI with contrast due to questionable viral meningitis

## 2018-05-19 NOTE — Patient Instructions (Signed)
I am not sure how to explain your new onset headache. You are not someone who has had migraines before. We need to get to the bottom of your headache and confusion and altered mental state. I would recommend that you go back to the emergency room as you are not just having a headache, you feel weak and disoriented and workup for this should be done urgently rather than as an outpatient. An underlying viral meningitis could be a cause of your symptoms. Nevertheless, I will order a brain MRI with and without contrast, EEG, which is a brain electrical test and sleep study to rule out sleep apnea as a cause for headaches.

## 2018-05-20 ENCOUNTER — Telehealth: Payer: Self-pay | Admitting: Family Medicine

## 2018-05-20 ENCOUNTER — Telehealth: Payer: Self-pay

## 2018-05-20 DIAGNOSIS — M170 Bilateral primary osteoarthritis of knee: Secondary | ICD-10-CM

## 2018-05-20 NOTE — Telephone Encounter (Signed)
Dexa scan order put in

## 2018-05-20 NOTE — Telephone Encounter (Signed)
[facility] is requesting a Dexa / Bone Density - I have talked with her Son and she is taking her 05/21/18 @ 1:45pm.

## 2018-05-21 ENCOUNTER — Ambulatory Visit (HOSPITAL_COMMUNITY)
Admission: RE | Admit: 2018-05-21 | Discharge: 2018-05-21 | Disposition: A | Discharge status: Home / Self Care | Payer: Medicare HMO | Source: Ambulatory Visit | Attending: Family Medicine | Admitting: Family Medicine

## 2018-05-21 DIAGNOSIS — Z780 Asymptomatic menopausal state: Secondary | ICD-10-CM | POA: Diagnosis not present

## 2018-05-21 DIAGNOSIS — M85852 Other specified disorders of bone density and structure, left thigh: Secondary | ICD-10-CM | POA: Insufficient documentation

## 2018-05-21 DIAGNOSIS — M170 Bilateral primary osteoarthritis of knee: Secondary | ICD-10-CM | POA: Diagnosis not present

## 2018-05-21 LAB — CULTURE, BLOOD (ROUTINE X 2)
CULTURE: NO GROWTH
Culture: NO GROWTH

## 2018-05-23 DIAGNOSIS — B004 Herpesviral encephalitis: Secondary | ICD-10-CM

## 2018-05-23 HISTORY — DX: Herpesviral encephalitis: B00.4

## 2018-05-26 ENCOUNTER — Ambulatory Visit: Payer: Medicare HMO | Admitting: Diagnostic Neuroimaging

## 2018-05-26 ENCOUNTER — Ambulatory Visit: Payer: Medicare HMO | Admitting: Family Medicine

## 2018-05-26 DIAGNOSIS — R519 Headache, unspecified: Secondary | ICD-10-CM

## 2018-05-26 DIAGNOSIS — R4182 Altered mental status, unspecified: Secondary | ICD-10-CM | POA: Diagnosis not present

## 2018-05-26 DIAGNOSIS — Z9289 Personal history of other medical treatment: Secondary | ICD-10-CM

## 2018-05-26 DIAGNOSIS — R51 Headache: Secondary | ICD-10-CM

## 2018-05-27 NOTE — Procedures (Signed)
   [facility]  EEG (ELECTROENCEPHALOGRAM) REPORT   STUDY DATE: 05/26/18 PATIENT NAME: Jillian [name] DOB: 1941-07-20 MRN: [id number]  ORDERING CLINICIAN: Star Age, MD PhD   TECHNOLOGIST: Arelia Longest  TECHNIQUE: Electroencephalogram was recorded utilizing standard 10-20 system of lead placement and reformatted into average and bipolar montages.  RECORDING TIME: 24 minutes  ACTIVATION: photic stimulation  CLINICAL INFORMATION: 77 year old female with confusion.  FINDINGS: Posterior dominant background rhythms, which attenuate with eye opening, ranging 8-9 hertz and 15-20 microvolts. No focal, lateralizing, epileptiform activity or seizures are seen. Patient recorded in the awake and drowsy state.    IMPRESSION:   Normal EEG in the awake and drowsy states.    INTERPRETING PHYSICIAN:  Penni Bombard, MD Certified in Neurology, Neurophysiology and Neuroimaging  [facility] [address] [place], [place] [address] [phone]

## 2018-05-28 ENCOUNTER — Telehealth: Payer: Self-pay | Admitting: *Deleted

## 2018-05-28 ENCOUNTER — Telehealth: Payer: Self-pay | Admitting: Family Medicine

## 2018-05-28 DIAGNOSIS — M25552 Pain in left hip: Secondary | ICD-10-CM | POA: Diagnosis not present

## 2018-05-28 DIAGNOSIS — R4189 Other symptoms and signs involving cognitive functions and awareness: Secondary | ICD-10-CM | POA: Diagnosis not present

## 2018-05-28 DIAGNOSIS — B002 Herpesviral gingivostomatitis and pharyngotonsillitis: Secondary | ICD-10-CM | POA: Diagnosis not present

## 2018-05-28 DIAGNOSIS — R945 Abnormal results of liver function studies: Secondary | ICD-10-CM | POA: Diagnosis not present

## 2018-05-28 DIAGNOSIS — M546 Pain in thoracic spine: Secondary | ICD-10-CM | POA: Diagnosis not present

## 2018-05-28 DIAGNOSIS — R51 Headache: Secondary | ICD-10-CM | POA: Diagnosis not present

## 2018-05-28 DIAGNOSIS — Z7984 Long term (current) use of oral hypoglycemic drugs: Secondary | ICD-10-CM | POA: Diagnosis not present

## 2018-05-28 DIAGNOSIS — R509 Fever, unspecified: Secondary | ICD-10-CM | POA: Diagnosis not present

## 2018-05-28 DIAGNOSIS — R531 Weakness: Secondary | ICD-10-CM | POA: Diagnosis not present

## 2018-05-28 DIAGNOSIS — M79605 Pain in left leg: Secondary | ICD-10-CM | POA: Diagnosis not present

## 2018-05-28 DIAGNOSIS — B2700 Gammaherpesviral mononucleosis without complication: Secondary | ICD-10-CM | POA: Diagnosis not present

## 2018-05-28 DIAGNOSIS — B1009 Other human herpesvirus encephalitis: Secondary | ICD-10-CM | POA: Diagnosis not present

## 2018-05-28 DIAGNOSIS — D72829 Elevated white blood cell count, unspecified: Secondary | ICD-10-CM | POA: Diagnosis not present

## 2018-05-28 DIAGNOSIS — R4182 Altered mental status, unspecified: Secondary | ICD-10-CM | POA: Diagnosis not present

## 2018-05-28 DIAGNOSIS — M1612 Unilateral primary osteoarthritis, left hip: Secondary | ICD-10-CM | POA: Diagnosis not present

## 2018-05-28 DIAGNOSIS — E119 Type 2 diabetes mellitus without complications: Secondary | ICD-10-CM | POA: Diagnosis not present

## 2018-05-28 DIAGNOSIS — I10 Essential (primary) hypertension: Secondary | ICD-10-CM | POA: Diagnosis not present

## 2018-05-28 DIAGNOSIS — K8689 Other specified diseases of pancreas: Secondary | ICD-10-CM | POA: Diagnosis not present

## 2018-05-28 DIAGNOSIS — M542 Cervicalgia: Secondary | ICD-10-CM | POA: Diagnosis not present

## 2018-05-28 DIAGNOSIS — E871 Hypo-osmolality and hyponatremia: Secondary | ICD-10-CM | POA: Diagnosis not present

## 2018-05-28 DIAGNOSIS — B003 Herpesviral meningitis: Secondary | ICD-10-CM | POA: Diagnosis not present

## 2018-05-28 DIAGNOSIS — N2889 Other specified disorders of kidney and ureter: Secondary | ICD-10-CM | POA: Diagnosis not present

## 2018-05-28 DIAGNOSIS — M79604 Pain in right leg: Secondary | ICD-10-CM | POA: Diagnosis not present

## 2018-05-28 DIAGNOSIS — B004 Herpesviral encephalitis: Secondary | ICD-10-CM | POA: Diagnosis not present

## 2018-05-28 NOTE — Telephone Encounter (Signed)
-----   Message from Star Age, MD sent at 05/28/2018  8:56 AM EDT ----- Please call and advise the patient that the EEG or brain wave test we performed was reported as normal in the awake and drowsy states. We checked for abnormal electrical discharges in the brain waves and the report suggested normal findings. No further action is required on this test at this time. Please remind patient to keep any upcoming appointments or tests and to call us with any interim questions, concerns, problems or updates. Thanks,  Star Age, MD, PhD

## 2018-05-28 NOTE — Progress Notes (Signed)
Please call and advise the patient that the EEG or brain wave test we performed was reported as normal in the awake and drowsy states. We checked for abnormal electrical discharges in the brain waves and the report suggested normal findings. No further action is required on this test at this time. Please remind patient to keep any upcoming appointments or tests and to call us with any interim questions, concerns, problems or updates. Thanks,  Ayushi Pla, MD, PhD  

## 2018-05-28 NOTE — Telephone Encounter (Signed)
Called and spoke with son about results per Dr. Rexene Alberts note. He verbalized understanding.   He wants to know what next steps will be. She has no pending appt. Advised Dr. Rexene Alberts out of the office and will be back Monday. I will check with her and call back to let him know. He verbalized understanding.

## 2018-05-28 NOTE — Telephone Encounter (Signed)
Patients son called, patient is still not able to walk, shes weak, not a lot of pain.  Requesting to speak with you to rule out some things & discuss a possible spider bite.  Delilah Shan (son) [phone]

## 2018-05-28 NOTE — Telephone Encounter (Signed)
Message left for pt's son to return call, will attempt to speak with the patient

## 2018-05-28 NOTE — Telephone Encounter (Signed)
I called Jillian [name]'s number, son answered, stated that he was taking his Mother to [facility] since EEG showed nothing and her condition was not good. I told him I agreed as we have no answers and her condition is not good I asked that he keep in touch, he said that he would

## 2018-05-31 NOTE — Telephone Encounter (Signed)
I called pt's son, Delilah Shan, per DPR, and explained Dr. Guadelupe Sabin recommendations. Pt's son reports that pt is at [facility] and after a lumbar puncture, pt has been diagnosed with viral meningitis. I advised pt's son that I would pass this along to Dr. Rexene Alberts. Pt's son verbalized understanding of Dr. Guadelupe Sabin recommendations.

## 2018-05-31 NOTE — Telephone Encounter (Signed)
Unfortunately, I am not sure what else to suggest from my end of things. Would recommend FU with PCP and perhaps consideration referral to a HA center at one of the academic centers. I can see her back in FU too, but not sure where to go from here. Pls advise pt's son. Her HAs were not as bad as I recall. I would be reluctant to put her on a migraine preventative for fear of SEs in particular, sedation.

## 2018-06-01 NOTE — Telephone Encounter (Signed)
Spoke with son l ast night, mother hospitalized with meningitis

## 2018-06-03 ENCOUNTER — Telehealth: Payer: Self-pay | Admitting: Family Medicine

## 2018-06-03 DIAGNOSIS — T375X5A Adverse effect of antiviral drugs, initial encounter: Secondary | ICD-10-CM | POA: Diagnosis not present

## 2018-06-03 DIAGNOSIS — R251 Tremor, unspecified: Secondary | ICD-10-CM | POA: Diagnosis not present

## 2018-06-03 DIAGNOSIS — B004 Herpesviral encephalitis: Secondary | ICD-10-CM | POA: Insufficient documentation

## 2018-06-03 DIAGNOSIS — R2689 Other abnormalities of gait and mobility: Secondary | ICD-10-CM | POA: Diagnosis not present

## 2018-06-03 DIAGNOSIS — A879 Viral meningitis, unspecified: Secondary | ICD-10-CM | POA: Diagnosis not present

## 2018-06-03 DIAGNOSIS — B003 Herpesviral meningitis: Secondary | ICD-10-CM | POA: Diagnosis not present

## 2018-06-03 DIAGNOSIS — N179 Acute kidney failure, unspecified: Secondary | ICD-10-CM | POA: Diagnosis not present

## 2018-06-03 DIAGNOSIS — M25552 Pain in left hip: Secondary | ICD-10-CM | POA: Diagnosis not present

## 2018-06-03 DIAGNOSIS — R4189 Other symptoms and signs involving cognitive functions and awareness: Secondary | ICD-10-CM | POA: Diagnosis not present

## 2018-06-03 DIAGNOSIS — D696 Thrombocytopenia, unspecified: Secondary | ICD-10-CM | POA: Diagnosis not present

## 2018-06-03 DIAGNOSIS — I10 Essential (primary) hypertension: Secondary | ICD-10-CM | POA: Diagnosis not present

## 2018-06-03 DIAGNOSIS — Z5189 Encounter for other specified aftercare: Secondary | ICD-10-CM | POA: Diagnosis not present

## 2018-06-03 DIAGNOSIS — Z452 Encounter for adjustment and management of vascular access device: Secondary | ICD-10-CM | POA: Diagnosis not present

## 2018-06-03 DIAGNOSIS — Z5181 Encounter for therapeutic drug level monitoring: Secondary | ICD-10-CM | POA: Diagnosis not present

## 2018-06-03 DIAGNOSIS — E119 Type 2 diabetes mellitus without complications: Secondary | ICD-10-CM | POA: Diagnosis not present

## 2018-06-03 DIAGNOSIS — Z7984 Long term (current) use of oral hypoglycemic drugs: Secondary | ICD-10-CM | POA: Diagnosis not present

## 2018-06-03 DIAGNOSIS — Z7409 Other reduced mobility: Secondary | ICD-10-CM | POA: Diagnosis not present

## 2018-06-03 DIAGNOSIS — A858 Other specified viral encephalitis: Secondary | ICD-10-CM | POA: Diagnosis not present

## 2018-06-03 MED ORDER — ASPIRIN EC 81 MG PO TBEC
81.00 | DELAYED_RELEASE_TABLET | ORAL | Status: DC
Start: 2018-06-04 — End: 2018-06-03

## 2018-06-03 MED ORDER — ACETAMINOPHEN 325 MG PO TABS
650.00 | ORAL_TABLET | ORAL | Status: DC
Start: ? — End: 2018-06-03

## 2018-06-03 MED ORDER — RANITIDINE HCL 150 MG PO TABS
300.00 | ORAL_TABLET | ORAL | Status: DC
Start: 2018-06-03 — End: 2018-06-03

## 2018-06-03 MED ORDER — TEMAZEPAM 15 MG PO CAPS
15.00 | ORAL_CAPSULE | ORAL | Status: DC
Start: ? — End: 2018-06-03

## 2018-06-03 MED ORDER — NAPROXEN 250 MG PO TABS
500.00 | ORAL_TABLET | ORAL | Status: DC
Start: 2018-06-03 — End: 2018-06-03

## 2018-06-03 MED ORDER — CLONIDINE HCL 0.1 MG PO TABS
0.10 | ORAL_TABLET | ORAL | Status: DC
Start: 2018-06-03 — End: 2018-06-03

## 2018-06-03 MED ORDER — SODIUM CHLORIDE 0.9 % IV SOLN
INTRAVENOUS | Status: DC
Start: ? — End: 2018-06-03

## 2018-06-03 MED ORDER — ENOXAPARIN SODIUM 40 MG/0.4ML ~~LOC~~ SOLN
40.00 | SUBCUTANEOUS | Status: DC
Start: 2018-06-04 — End: 2018-06-03

## 2018-06-03 MED ORDER — GENERIC EXTERNAL MEDICATION
10.00 | Status: DC
Start: 2018-06-03 — End: 2018-06-03

## 2018-06-03 NOTE — Telephone Encounter (Signed)
Need record from [facility] to ensure that what I an hearing from son is accurate .so that what I have written down is true.  I see nothing in Care everywhere.  I have filled out the [facility] on what the son has been telling me, but I need a medical record. Can you try to get the discharge summary, send for it.please My understanding is that she is being discharged tomorrow to a rehab facility [facility]

## 2018-06-03 NOTE — Telephone Encounter (Signed)
My Jillian [name] stopped by to drop off FMLA--as the other Dr told him to go to the PCP.    FMLA COPIED Sleeved  Noted --Placed in Dr Camillia Herter box

## 2018-06-04 ENCOUNTER — Telehealth: Payer: Self-pay | Admitting: Family Medicine

## 2018-06-04 NOTE — Telephone Encounter (Signed)
Faxed medical records request

## 2018-06-04 NOTE — Telephone Encounter (Signed)
You do not need to request records , they have  Been sent . Thank you

## 2018-06-04 NOTE — Telephone Encounter (Signed)
As I called and explained we ALREADY hAVE the records in the electronic medical record, thank you, I also entered that in the electronic medical record! You may sign off on this!!!

## 2018-06-08 DIAGNOSIS — I10 Essential (primary) hypertension: Secondary | ICD-10-CM

## 2018-06-11 DIAGNOSIS — D696 Thrombocytopenia, unspecified: Secondary | ICD-10-CM | POA: Diagnosis not present

## 2018-06-11 DIAGNOSIS — Z7409 Other reduced mobility: Secondary | ICD-10-CM | POA: Insufficient documentation

## 2018-06-11 DIAGNOSIS — B004 Herpesviral encephalitis: Secondary | ICD-10-CM | POA: Diagnosis not present

## 2018-06-11 DIAGNOSIS — R4189 Other symptoms and signs involving cognitive functions and awareness: Secondary | ICD-10-CM | POA: Diagnosis not present

## 2018-06-11 DIAGNOSIS — I10 Essential (primary) hypertension: Secondary | ICD-10-CM | POA: Diagnosis not present

## 2018-06-11 DIAGNOSIS — Z452 Encounter for adjustment and management of vascular access device: Secondary | ICD-10-CM | POA: Diagnosis not present

## 2018-06-11 DIAGNOSIS — N179 Acute kidney failure, unspecified: Secondary | ICD-10-CM | POA: Diagnosis not present

## 2018-06-11 DIAGNOSIS — R2689 Other abnormalities of gait and mobility: Secondary | ICD-10-CM | POA: Diagnosis not present

## 2018-06-11 DIAGNOSIS — A879 Viral meningitis, unspecified: Secondary | ICD-10-CM | POA: Diagnosis not present

## 2018-06-11 DIAGNOSIS — E119 Type 2 diabetes mellitus without complications: Secondary | ICD-10-CM | POA: Diagnosis not present

## 2018-06-11 DIAGNOSIS — Z5181 Encounter for therapeutic drug level monitoring: Secondary | ICD-10-CM | POA: Diagnosis not present

## 2018-06-11 MED ORDER — SODIUM CHLORIDE FLUSH 0.9 % IV SOLN
20.00 | INTRAVENOUS | Status: DC
Start: ? — End: 2018-06-11

## 2018-06-11 MED ORDER — PRAVASTATIN SODIUM 10 MG PO TABS
20.00 | ORAL_TABLET | ORAL | Status: DC
Start: 2018-06-11 — End: 2018-06-11

## 2018-06-11 MED ORDER — INSULIN LISPRO 100 UNIT/ML ~~LOC~~ SOLN
2.00 | SUBCUTANEOUS | Status: DC
Start: 2018-06-11 — End: 2018-06-11

## 2018-06-11 MED ORDER — RANITIDINE HCL 150 MG PO TABS
300.00 | ORAL_TABLET | ORAL | Status: DC
Start: 2018-06-11 — End: 2018-06-11

## 2018-06-11 MED ORDER — GENERIC EXTERNAL MEDICATION
10.00 | Status: DC
Start: 2018-06-11 — End: 2018-06-11

## 2018-06-11 MED ORDER — ASPIRIN EC 81 MG PO TBEC
81.00 | DELAYED_RELEASE_TABLET | ORAL | Status: DC
Start: 2018-06-12 — End: 2018-06-11

## 2018-06-11 MED ORDER — CLONIDINE HCL 0.2 MG PO TABS
0.20 | ORAL_TABLET | ORAL | Status: DC
Start: 2018-06-11 — End: 2018-06-11

## 2018-06-11 MED ORDER — ACETAMINOPHEN 325 MG PO TABS
650.00 | ORAL_TABLET | ORAL | Status: DC
Start: ? — End: 2018-06-11

## 2018-06-11 MED ORDER — SODIUM CHLORIDE FLUSH 0.9 % IV SOLN
20.00 | INTRAVENOUS | Status: DC
Start: 2018-06-12 — End: 2018-06-11

## 2018-06-11 MED ORDER — TRAMADOL HCL 50 MG PO TABS
50.00 | ORAL_TABLET | ORAL | Status: DC
Start: ? — End: 2018-06-11

## 2018-06-11 MED ORDER — AMLODIPINE BESYLATE 5 MG PO TABS
5.00 | ORAL_TABLET | ORAL | Status: DC
Start: 2018-06-12 — End: 2018-06-11

## 2018-06-11 MED ORDER — DEXTROSE 10 % IV SOLN
125.00 | INTRAVENOUS | Status: DC
Start: ? — End: 2018-06-11

## 2018-06-11 MED ORDER — ONDANSETRON 4 MG PO TBDP
4.00 | ORAL_TABLET | ORAL | Status: DC
Start: ? — End: 2018-06-11

## 2018-06-11 MED ORDER — TEMAZEPAM 15 MG PO CAPS
15.00 | ORAL_CAPSULE | ORAL | Status: DC
Start: ? — End: 2018-06-11

## 2018-06-11 MED ORDER — TAMOXIFEN CITRATE 10 MG PO TABS
20.00 | ORAL_TABLET | ORAL | Status: DC
Start: 2018-06-12 — End: 2018-06-11

## 2018-06-11 MED ORDER — SENNOSIDES-DOCUSATE SODIUM 8.6-50 MG PO TABS
1.00 | ORAL_TABLET | ORAL | Status: DC
Start: ? — End: 2018-06-11

## 2018-06-11 MED ORDER — LACTATED RINGERS IV SOLN
500.00 | INTRAVENOUS | Status: DC
Start: 2018-06-11 — End: 2018-06-11

## 2018-06-14 ENCOUNTER — Emergency Department (HOSPITAL_COMMUNITY)
Admission: EM | Admit: 2018-06-14 | Discharge: 2018-06-14 | Disposition: A | Discharge status: Home / Self Care | Payer: Medicare HMO | Attending: Emergency Medicine | Admitting: Emergency Medicine

## 2018-06-14 ENCOUNTER — Emergency Department (HOSPITAL_COMMUNITY): Payer: Medicare HMO

## 2018-06-14 ENCOUNTER — Other Ambulatory Visit: Payer: Self-pay

## 2018-06-14 ENCOUNTER — Encounter (HOSPITAL_COMMUNITY): Payer: Self-pay

## 2018-06-14 ENCOUNTER — Telehealth: Payer: Self-pay

## 2018-06-14 DIAGNOSIS — Z79899 Other long term (current) drug therapy: Secondary | ICD-10-CM | POA: Diagnosis not present

## 2018-06-14 DIAGNOSIS — Z452 Encounter for adjustment and management of vascular access device: Secondary | ICD-10-CM | POA: Diagnosis not present

## 2018-06-14 DIAGNOSIS — Z7984 Long term (current) use of oral hypoglycemic drugs: Secondary | ICD-10-CM | POA: Insufficient documentation

## 2018-06-14 DIAGNOSIS — I10 Essential (primary) hypertension: Secondary | ICD-10-CM | POA: Insufficient documentation

## 2018-06-14 DIAGNOSIS — Z7982 Long term (current) use of aspirin: Secondary | ICD-10-CM | POA: Diagnosis not present

## 2018-06-14 DIAGNOSIS — E119 Type 2 diabetes mellitus without complications: Secondary | ICD-10-CM | POA: Diagnosis not present

## 2018-06-14 DIAGNOSIS — T82838A Hemorrhage of vascular prosthetic devices, implants and grafts, initial encounter: Secondary | ICD-10-CM

## 2018-06-14 DIAGNOSIS — Z95828 Presence of other vascular implants and grafts: Secondary | ICD-10-CM

## 2018-06-14 DIAGNOSIS — Y829 Unspecified medical devices associated with adverse incidents: Secondary | ICD-10-CM | POA: Insufficient documentation

## 2018-06-14 HISTORY — DX: Viral meningitis, unspecified: A87.9

## 2018-06-14 MED ORDER — SODIUM CHLORIDE 0.9% FLUSH: 10.0000 mL | INTRAVENOUS | Status: DC | PRN

## 2018-06-14 MED ORDER — SODIUM CHLORIDE 0.9% FLUSH: 10.0000 mL | Freq: Two times a day (BID) | INTRAVENOUS | Status: DC

## 2018-06-14 NOTE — Discharge Instructions (Addendum)
Complete your antiviral treatments. See a clinician if you develop fevers, vomiting or other symptoms.

## 2018-06-14 NOTE — Progress Notes (Signed)
Peripherally Inserted Central Catheter/Midline Placement  The IV Nurse has discussed with the patient and/or persons authorized to consent for the patient, the purpose of this procedure and the potential benefits and risks involved with this procedure.  The benefits include less needle sticks, lab draws from the catheter, and the patient may be discharged home with the catheter. Risks include, but not limited to, infection, bleeding, blood clot (thrombus formation), and puncture of an artery; nerve damage and irregular heartbeat and possibility to perform a PICC exchange if needed/ordered by physician.  Alternatives to this procedure were also discussed.  Bard Power PICC patient education guide, fact sheet on infection prevention and patient information card has been provided to patient /or left at bedside.    PICC/Midline Placement Documentation  PICC Single Lumen 06/14/18 PICC Right Brachial 36 cm 0 cm (Active)  Indication for Insertion or Continuance of Line Home intravenous therapies (PICC only) 06/14/2018 12:28 PM  Exposed Catheter (cm) 0 cm 06/14/2018 12:28 PM  Site Assessment Clean;Dry;Intact 06/14/2018 12:28 PM  Line Status Flushed;No blood return 06/14/2018 12:28 PM  Dressing Type Transparent 06/14/2018 12:28 PM  Dressing Status Clean;Dry;Intact;Antimicrobial disc in place 06/14/2018 12:28 PM  Dressing Intervention New dressing 06/14/2018 12:28 PM  Dressing Change Due 06/21/18 06/14/2018 12:28 PM       Jillian [name] 06/14/2018, 12:30 PM

## 2018-06-14 NOTE — Progress Notes (Signed)
Spoke to ER RN, to get order for chest xray to check for  PICC placement. Will follow up.

## 2018-06-14 NOTE — ED Notes (Signed)
PICC line team to come to [facility] from [place] to place new PICC today.

## 2018-06-14 NOTE — ED Notes (Signed)
PICC line to left upper arm removed at this time with no bleeding. Pressure dressing applied.

## 2018-06-14 NOTE — ED Notes (Signed)
PICC line team was called and told pt needed a new picc line placed per Dr. Reather Converse and she gets IV antibiotics at home q8hrs.

## 2018-06-14 NOTE — ED Triage Notes (Addendum)
Pt reports has picc in left upper arm and is on IV antibiotics for viral meningitis.  Reports has been on the antibiotics for approx 16 days.  Reports has to take them for 21 days.  Pt denies any pain.     Pt says she accidentally pulled her picc line while going to the restroom today.  Blood noticed around picc line insertion site.

## 2018-06-14 NOTE — ED Provider Notes (Signed)
[facility] EMERGENCY DEPARTMENT Provider Note   CSN: [id number] Arrival date & time: 06/14/18  1194     History   Chief Complaint Chief Complaint  Patient presents with  . Vascular Access Problem    HPI Jillian [name] is a 76 y.o. female.  Patient presents for assessment of PICC line as she accidentally pulled on it this morning while going to the restroom.  Patient noticed blood around the site.  Patient has been receiving it for viral meningitis she has been receiving acyclovir 3 times a day.  Patient has approximately 5 or 6 more days remaining.  Patient was seen here and also at [facility].  Patient denies fevers chills or other symptoms.  Her son attest that she is doing well.     Past Medical History:  Diagnosis Date  . Chronic fatigue   . Diabetes mellitus 2011  . DJD (degenerative joint disease)    of neck    . Dyslipidemia   . Hypertension 2005  . Mild obesity   . Postmenopausal   . Viral meningitis   . Wears glasses     Patient Active Problem List   Diagnosis Date Noted  . Hospital discharge follow-up 05/18/2018  . Intractable headache 05/18/2018  . Dizziness 05/17/2018  . Confusion   . Ataxia   . Diabetes mellitus type 2 in obese ([place]) 05/06/2018  . Back pain with right-sided radiculopathy 04/28/2018  . Longitudinal split nail 02/15/2018  . Bursitis of deltoid, left 09/09/2017  . Chronic left SI joint pain 05/21/2016  . Primary osteoarthritis of both knees 06/26/2015  . 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 Thoracic spine pain 09/28/2014  . Hip pain 08/06/2012  . 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 Hot flashes, menopausal 09/10/2011  . 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 INSOMNIA, CHRONIC 11/28/2009  . Hyperlipidemia with target LDL less than 100 10/12/2007  . Obesity (BMI 30.0-34.9) 10/12/2007  . ESSENTIAL HYPERTENSION, BENIGN 10/12/2007    Past Surgical History:    Procedure Laterality Date  . ABDOMINAL HYSTERECTOMY  1992   fibroids  . APPENDECTOMY    . BREAST EXCISIONAL BIOPSY Right    benign  . BREAST LUMPECTOMY WITH RADIOACTIVE SEED LOCALIZATION Right 10/05/2014   Procedure: BREAST LUMPECTOMY WITH RADIOACTIVE SEED LOCALIZATION;  Surgeon: Erroll Luna, MD;  Location: [place];  Service: General;  Laterality: Right;  . cataract Bilateral   . CYST EXCISION Right    arm  . EYE SURGERY Right 06/07/2013   cataract  . EYE SURGERY Left 09/14/2013   cataract  . KNEE ARTHROSCOPY Right 02/26/2016   Procedure: ARTHROSCOPY RIGHT KNEE WITH MENSICAL DEBRIDEMENT;  Surgeon: Gaynelle Arabian, MD;  Location: [facility];  Service: Orthopedics;  Laterality: Right;  . left breast biopsy    . LESION REMOVAL Right 03/30/2013   Procedure: EXCISION OF NEOPLASM ARM;  Surgeon: Jamesetta So, MD;  Location: [facility];  Service: General;  Laterality: Right;  . TUBAL LIGATION       OB History    Gravida  2   Para  2   Term  2   Preterm      AB      Living        SAB      TAB      Ectopic      Multiple      Live Births  Home Medications    Prior to Admission medications   Medication Sig Start Date End Date Taking? Authorizing Provider  acetaminophen (TYLENOL) 500 MG tablet Take 1,000 mg by mouth every 6 (six) hours as needed for mild pain.    [provider]  aspirin EC 81 MG tablet Take 1 tablet (81 mg total) by mouth every evening. Patient taking differently: Take 81 mg by mouth every morning.  04/23/16   Fayrene Helper, MD  benazepril (LOTENSIN) 20 MG tablet Take 1 tablet (20 mg total) by mouth daily. 05/18/18 06/17/18  Murlean Iba, MD  blood glucose meter kit and supplies Once daily testing- dispense formulary meter and supplies dx e11.9 05/04/18   Fayrene Helper, MD  cloNIDine (CATAPRES) 0.1 MG tablet Take 1 tablet (0.1 mg total) by mouth 2 (two) times daily. 05/17/18   Johnson, Clanford L, MD   glucose blood test strip Use as instructed once daily for one touch meter dx e11.9 05/04/18   Fayrene Helper, MD  metFORMIN (GLUCOPHAGE-XR) 500 MG 24 hr tablet Take 1 tablet (500 mg total) by mouth 2 (two) times daily. 09/09/17   Fayrene Helper, MD  pravastatin (PRAVACHOL) 10 MG tablet Take 10 mg by mouth daily.    [provider]  ranitidine (ZANTAC) 300 MG tablet TAKE 1 TABLET(300 MG) BY MOUTH AT BEDTIME 04/28/18   Fayrene Helper, MD  tamoxifen (NOLVADEX) 20 MG tablet Take 1 tablet (20 mg total) by mouth daily. 04/30/18   Higgs, Mathis Dad, MD  temazepam (RESTORIL) 15 MG capsule Take 1 capsule (15 mg total) by mouth at bedtime. 04/28/18   Fayrene Helper, MD  venlafaxine XR (EFFEXOR-XR) 37.5 MG 24 hr capsule TAKE 1 CAPSULE (37.5 MG TOTAL) BY MOUTH DAILY WITH BREAKFAST. 04/07/18   Fayrene Helper, MD    Family History Family History  Problem Relation Age of Onset  . Hypertension Father   . Congestive Heart Failure Father   . Liver cancer Mother   . Hypertension Sister   . Hypertension Brother   . Diabetes Brother     Social History Social History   Tobacco Use  . Smoking status: Never Smoker  . Smokeless tobacco: Never Used  Substance Use Topics  . Alcohol use: No  . Drug use: No     Allergies   Codeine and Propoxyphene n-acetaminophen   Review of Systems Review of Systems  Constitutional: Negative for chills and fever.  HENT: Negative for congestion.   Eyes: Negative for visual disturbance.  Respiratory: Negative for shortness of breath.   Cardiovascular: Negative for chest pain.  Gastrointestinal: Negative for abdominal pain and vomiting.  Genitourinary: Negative for dysuria and flank pain.  Musculoskeletal: Negative for back pain, neck pain and neck stiffness.  Skin: Negative for rash.  Neurological: Negative for light-headedness and headaches.     Physical Exam Updated Vital Signs BP 129/72 (BP Location: Right Arm)   Pulse 74   Temp  98.5 F (36.9 C) (Oral)   Resp 18   Ht _0  (1.626 m)   Wt 81.6 kg   SpO2 97%   BMI 30.90 kg/m   Physical Exam  Constitutional: She is oriented to person, place, and time. She appears well-developed and well-nourished.  HENT:  Head: Normocephalic and atraumatic.  Eyes: Conjunctivae are normal. Right eye exhibits no discharge. Left eye exhibits no discharge.  Neck: Neck supple. No tracheal deviation present.  Cardiovascular: Normal rate.  Pulmonary/Chest: Effort normal.  Abdominal: She exhibits  no distension.  Musculoskeletal: She exhibits no edema.  Neurological: She is alert and oriented to person, place, and time.  Skin: Skin is warm. No rash noted.  Patient has PICC line in place left medial upper arm, mild bleeding around entrance site, no spreading erythema warmth or induration.  Bandage in place with small amount blood beneath.  Psychiatric: She has a normal mood and affect.  Nursing note and vitals reviewed.    ED Treatments / Results  Labs (all labs ordered are listed, but only abnormal results are displayed) Labs Reviewed - No data to display  EKG None  Radiology Dg Chest [facility] 1 View  Result Date: 06/14/2018 CLINICAL DATA:  PICC placement. EXAM: CHEST  1 VIEW COMPARISON:  05/16/2018. FINDINGS: Trachea is midline. Heart size within normal limits. Lungs are somewhat low in volume but clear. Left PICC tip terminates in the left axilla. IMPRESSION: 1. Left PICC tip terminates in the left axillary region. 2. No acute findings. Electronically Signed   By: Lorin Picket M.D.   On: 06/14/2018 08:08    Procedures Procedures (including critical care time)  Medications Ordered in ED Medications - No data to display   Initial Impression / Assessment and Plan / ED Course  I have reviewed the triage vital signs and the nursing notes.  Pertinent labs & imaging results that were available during my care of the patient were reviewed by me and considered in my medical  decision making (see chart for details).    Patient presents for assessment of PICC line, no sign of infection at this time.  X-ray reviewed and the tip terminates in the axilla.  PICC line team called/nursing discussed bandage change and outpatient follow-up. X-ray reviewed PICC line not in appropriate position.  PICC line team came and replaced with .  Patient stable for outpatient follow-up. Results and differential diagnosis were discussed with the patient/parent/guardian. Xrays were independently reviewed by myself.  Close follow up outpatient was discussed, comfortable with the plan.   Medications - No data to display  Vitals:   06/14/18 0708 06/14/18 0710  BP:  129/72  Pulse:  74  Resp:  18  Temp:  98.5 F (36.9 C)  TempSrc:  Oral  SpO2:  97%  Weight: 81.6 kg   Height: _0  (1.626 m)     Final diagnoses:  S/P PICC central line placement     Final Clinical Impressions(s) / ED Diagnoses   Final diagnoses:  S/P PICC central line placement    ED Discharge Orders    None       Elnora Morrison, MD 06/14/18 1226

## 2018-06-14 NOTE — Telephone Encounter (Signed)
[id number]  Attempted to contact patient to complete [facility] telephone call.  Left message requesting call back. Patient recently discharged from SNF. Will try again later. 1st attempt

## 2018-06-15 DIAGNOSIS — N179 Acute kidney failure, unspecified: Secondary | ICD-10-CM | POA: Diagnosis not present

## 2018-06-15 DIAGNOSIS — R2689 Other abnormalities of gait and mobility: Secondary | ICD-10-CM | POA: Diagnosis not present

## 2018-06-15 DIAGNOSIS — Z5181 Encounter for therapeutic drug level monitoring: Secondary | ICD-10-CM | POA: Diagnosis not present

## 2018-06-15 DIAGNOSIS — Z452 Encounter for adjustment and management of vascular access device: Secondary | ICD-10-CM | POA: Diagnosis not present

## 2018-06-15 DIAGNOSIS — R4189 Other symptoms and signs involving cognitive functions and awareness: Secondary | ICD-10-CM | POA: Diagnosis not present

## 2018-06-15 DIAGNOSIS — B004 Herpesviral encephalitis: Secondary | ICD-10-CM | POA: Diagnosis not present

## 2018-06-15 DIAGNOSIS — D696 Thrombocytopenia, unspecified: Secondary | ICD-10-CM | POA: Diagnosis not present

## 2018-06-15 DIAGNOSIS — E119 Type 2 diabetes mellitus without complications: Secondary | ICD-10-CM | POA: Diagnosis not present

## 2018-06-15 DIAGNOSIS — I10 Essential (primary) hypertension: Secondary | ICD-10-CM | POA: Diagnosis not present

## 2018-06-15 NOTE — Telephone Encounter (Signed)
[id number]  Attempted to contact patient to complete [facility] telephone call. No answer. Left message requesting call back. Will try again later. 2nd attempt

## 2018-06-15 NOTE — Telephone Encounter (Signed)
[id number]  Attempted to contact patient to complete TOC telephone call. No answer. Left message requesting call back.3rd attempt   

## 2018-06-16 DIAGNOSIS — R4189 Other symptoms and signs involving cognitive functions and awareness: Secondary | ICD-10-CM | POA: Diagnosis not present

## 2018-06-16 DIAGNOSIS — R2689 Other abnormalities of gait and mobility: Secondary | ICD-10-CM | POA: Diagnosis not present

## 2018-06-16 DIAGNOSIS — N179 Acute kidney failure, unspecified: Secondary | ICD-10-CM | POA: Diagnosis not present

## 2018-06-16 DIAGNOSIS — Z5181 Encounter for therapeutic drug level monitoring: Secondary | ICD-10-CM | POA: Diagnosis not present

## 2018-06-16 DIAGNOSIS — I10 Essential (primary) hypertension: Secondary | ICD-10-CM | POA: Diagnosis not present

## 2018-06-16 DIAGNOSIS — B004 Herpesviral encephalitis: Secondary | ICD-10-CM | POA: Diagnosis not present

## 2018-06-16 DIAGNOSIS — E119 Type 2 diabetes mellitus without complications: Secondary | ICD-10-CM | POA: Diagnosis not present

## 2018-06-16 DIAGNOSIS — D696 Thrombocytopenia, unspecified: Secondary | ICD-10-CM | POA: Diagnosis not present

## 2018-06-16 DIAGNOSIS — Z452 Encounter for adjustment and management of vascular access device: Secondary | ICD-10-CM | POA: Diagnosis not present

## 2018-06-17 ENCOUNTER — Telehealth: Payer: Self-pay | Admitting: Family Medicine

## 2018-06-17 DIAGNOSIS — R4189 Other symptoms and signs involving cognitive functions and awareness: Secondary | ICD-10-CM | POA: Diagnosis not present

## 2018-06-17 DIAGNOSIS — D696 Thrombocytopenia, unspecified: Secondary | ICD-10-CM | POA: Diagnosis not present

## 2018-06-17 DIAGNOSIS — R2689 Other abnormalities of gait and mobility: Secondary | ICD-10-CM | POA: Diagnosis not present

## 2018-06-17 DIAGNOSIS — B004 Herpesviral encephalitis: Secondary | ICD-10-CM | POA: Diagnosis not present

## 2018-06-17 DIAGNOSIS — E119 Type 2 diabetes mellitus without complications: Secondary | ICD-10-CM | POA: Diagnosis not present

## 2018-06-17 DIAGNOSIS — Z452 Encounter for adjustment and management of vascular access device: Secondary | ICD-10-CM | POA: Diagnosis not present

## 2018-06-17 DIAGNOSIS — Z5181 Encounter for therapeutic drug level monitoring: Secondary | ICD-10-CM | POA: Diagnosis not present

## 2018-06-17 DIAGNOSIS — I10 Essential (primary) hypertension: Secondary | ICD-10-CM | POA: Diagnosis not present

## 2018-06-17 DIAGNOSIS — N179 Acute kidney failure, unspecified: Secondary | ICD-10-CM | POA: Diagnosis not present

## 2018-06-17 NOTE — Telephone Encounter (Signed)
Jillian [name] with [facility] is calling to advised that the PT sodium is High 145, and her Potassium is Low at 3.1.  Jillian [name] advised she was going to in-courage the patient to drink more.  Also Jillian [name] wanted to know --The PT will be completed with the Antibiotics on Friday, do you want [facility] to pull the pic line.  I advised that Dr Moshe Cipro was unavailable for comment, and I could reach out to the Dr on call.-- Jillian [name] advised that the [employer] could wait and she would follow back up on Monday.

## 2018-06-18 DIAGNOSIS — D696 Thrombocytopenia, unspecified: Secondary | ICD-10-CM | POA: Diagnosis not present

## 2018-06-18 DIAGNOSIS — B004 Herpesviral encephalitis: Secondary | ICD-10-CM | POA: Diagnosis not present

## 2018-06-18 DIAGNOSIS — R4189 Other symptoms and signs involving cognitive functions and awareness: Secondary | ICD-10-CM | POA: Diagnosis not present

## 2018-06-18 DIAGNOSIS — R2689 Other abnormalities of gait and mobility: Secondary | ICD-10-CM | POA: Diagnosis not present

## 2018-06-18 DIAGNOSIS — Z452 Encounter for adjustment and management of vascular access device: Secondary | ICD-10-CM | POA: Diagnosis not present

## 2018-06-18 DIAGNOSIS — Z5181 Encounter for therapeutic drug level monitoring: Secondary | ICD-10-CM | POA: Diagnosis not present

## 2018-06-18 DIAGNOSIS — E119 Type 2 diabetes mellitus without complications: Secondary | ICD-10-CM | POA: Diagnosis not present

## 2018-06-18 DIAGNOSIS — N179 Acute kidney failure, unspecified: Secondary | ICD-10-CM | POA: Diagnosis not present

## 2018-06-18 DIAGNOSIS — I10 Essential (primary) hypertension: Secondary | ICD-10-CM | POA: Diagnosis not present

## 2018-06-21 ENCOUNTER — Other Ambulatory Visit: Payer: Self-pay | Admitting: Family Medicine

## 2018-06-21 ENCOUNTER — Telehealth: Payer: Self-pay | Admitting: Family Medicine

## 2018-06-21 DIAGNOSIS — N179 Acute kidney failure, unspecified: Secondary | ICD-10-CM | POA: Diagnosis not present

## 2018-06-21 DIAGNOSIS — R2689 Other abnormalities of gait and mobility: Secondary | ICD-10-CM | POA: Diagnosis not present

## 2018-06-21 DIAGNOSIS — B004 Herpesviral encephalitis: Secondary | ICD-10-CM | POA: Diagnosis not present

## 2018-06-21 DIAGNOSIS — E119 Type 2 diabetes mellitus without complications: Secondary | ICD-10-CM | POA: Diagnosis not present

## 2018-06-21 DIAGNOSIS — Z452 Encounter for adjustment and management of vascular access device: Secondary | ICD-10-CM | POA: Diagnosis not present

## 2018-06-21 DIAGNOSIS — I10 Essential (primary) hypertension: Secondary | ICD-10-CM | POA: Diagnosis not present

## 2018-06-21 DIAGNOSIS — Z5181 Encounter for therapeutic drug level monitoring: Secondary | ICD-10-CM | POA: Diagnosis not present

## 2018-06-21 DIAGNOSIS — R4189 Other symptoms and signs involving cognitive functions and awareness: Secondary | ICD-10-CM | POA: Diagnosis not present

## 2018-06-21 DIAGNOSIS — D696 Thrombocytopenia, unspecified: Secondary | ICD-10-CM | POA: Diagnosis not present

## 2018-06-21 MED ORDER — POTASSIUM CHLORIDE ER 10 MEQ PO TBCR: 10.0000 meq | EXTENDED_RELEASE_TABLET | Freq: Two times a day (BID) | ORAL | 0 refills | Status: DC

## 2018-06-21 NOTE — Progress Notes (Signed)
Potassium prescribed. 

## 2018-06-21 NOTE — Telephone Encounter (Signed)
Jillian [name] did a speech evaluation only on Jillian [name]-Jillian [name] does not want any speech therapy

## 2018-06-21 NOTE — Telephone Encounter (Signed)
Just FYI.

## 2018-06-21 NOTE — Telephone Encounter (Signed)
Spoke with Juliann Pulse at [facility] and let her know that per Dr.Simpson, Ms.Dibert's PIC line is to be removed today and she is to start Potassium bid with verbal understanding.

## 2018-06-21 NOTE — Telephone Encounter (Signed)
Pls advise  removal of PIC line today  Also let nurse and pt know I have prescribed twice daily potassium which she needs to start today

## 2018-06-22 DIAGNOSIS — Z452 Encounter for adjustment and management of vascular access device: Secondary | ICD-10-CM | POA: Diagnosis not present

## 2018-06-22 DIAGNOSIS — R2689 Other abnormalities of gait and mobility: Secondary | ICD-10-CM | POA: Diagnosis not present

## 2018-06-22 DIAGNOSIS — N179 Acute kidney failure, unspecified: Secondary | ICD-10-CM | POA: Diagnosis not present

## 2018-06-22 DIAGNOSIS — I10 Essential (primary) hypertension: Secondary | ICD-10-CM | POA: Diagnosis not present

## 2018-06-22 DIAGNOSIS — R4189 Other symptoms and signs involving cognitive functions and awareness: Secondary | ICD-10-CM | POA: Diagnosis not present

## 2018-06-22 DIAGNOSIS — B004 Herpesviral encephalitis: Secondary | ICD-10-CM | POA: Diagnosis not present

## 2018-06-22 DIAGNOSIS — Z5181 Encounter for therapeutic drug level monitoring: Secondary | ICD-10-CM | POA: Diagnosis not present

## 2018-06-22 DIAGNOSIS — E119 Type 2 diabetes mellitus without complications: Secondary | ICD-10-CM | POA: Diagnosis not present

## 2018-06-22 DIAGNOSIS — D696 Thrombocytopenia, unspecified: Secondary | ICD-10-CM | POA: Diagnosis not present

## 2018-06-23 ENCOUNTER — Encounter: Payer: Self-pay | Admitting: Family Medicine

## 2018-06-23 ENCOUNTER — Ambulatory Visit (INDEPENDENT_AMBULATORY_CARE_PROVIDER_SITE_OTHER): Payer: Medicare HMO | Admitting: Family Medicine

## 2018-06-23 ENCOUNTER — Other Ambulatory Visit: Payer: Self-pay

## 2018-06-23 VITALS — BP 110/70 | HR 97 | Resp 12 | Ht 64.0 in | Wt 174.0 lb

## 2018-06-23 DIAGNOSIS — E785 Hyperlipidemia, unspecified: Secondary | ICD-10-CM | POA: Diagnosis not present

## 2018-06-23 DIAGNOSIS — E1169 Type 2 diabetes mellitus with other specified complication: Secondary | ICD-10-CM

## 2018-06-23 DIAGNOSIS — E669 Obesity, unspecified: Secondary | ICD-10-CM | POA: Diagnosis not present

## 2018-06-23 DIAGNOSIS — Z09 Encounter for follow-up examination after completed treatment for conditions other than malignant neoplasm: Secondary | ICD-10-CM | POA: Diagnosis not present

## 2018-06-23 DIAGNOSIS — B004 Herpesviral encephalitis: Secondary | ICD-10-CM | POA: Diagnosis not present

## 2018-06-23 DIAGNOSIS — I10 Essential (primary) hypertension: Secondary | ICD-10-CM | POA: Diagnosis not present

## 2018-06-23 MED ORDER — CLONIDINE HCL 0.1 MG PO TABS: ORAL_TABLET | ORAL | 3 refills | Status: DC

## 2018-06-23 MED ORDER — METFORMIN HCL ER (OSM) 500 MG PO TB24: 500.0000 mg | ORAL_TABLET | Freq: Every day | ORAL | 1 refills | Status: DC

## 2018-06-23 MED ORDER — PRAVASTATIN SODIUM 10 MG PO TABS: 10.0000 mg | ORAL_TABLET | Freq: Every day | ORAL | 3 refills | Status: DC

## 2018-06-23 NOTE — Patient Instructions (Addendum)
Please cancel October visit, she returns in December for her physical exam, please keep that  Labs today,   Thankful you are much impproved, give your body time to heal, take it slowly  REDUCE clonidine dose to 0.1 mg one at bedtime, I am sending to mail order  Resume metformin 500  Mg one daily  Stop zantac/ ranitidine  Stop temazepam

## 2018-06-24 ENCOUNTER — Telehealth: Payer: Self-pay

## 2018-06-24 DIAGNOSIS — Z5181 Encounter for therapeutic drug level monitoring: Secondary | ICD-10-CM

## 2018-06-24 DIAGNOSIS — E119 Type 2 diabetes mellitus without complications: Secondary | ICD-10-CM

## 2018-06-24 DIAGNOSIS — Z7982 Long term (current) use of aspirin: Secondary | ICD-10-CM

## 2018-06-24 DIAGNOSIS — N179 Acute kidney failure, unspecified: Secondary | ICD-10-CM

## 2018-06-24 DIAGNOSIS — Z452 Encounter for adjustment and management of vascular access device: Secondary | ICD-10-CM | POA: Diagnosis not present

## 2018-06-24 DIAGNOSIS — B004 Herpesviral encephalitis: Secondary | ICD-10-CM | POA: Diagnosis not present

## 2018-06-24 DIAGNOSIS — D696 Thrombocytopenia, unspecified: Secondary | ICD-10-CM | POA: Diagnosis not present

## 2018-06-24 DIAGNOSIS — R4189 Other symptoms and signs involving cognitive functions and awareness: Secondary | ICD-10-CM | POA: Diagnosis not present

## 2018-06-24 DIAGNOSIS — R2689 Other abnormalities of gait and mobility: Secondary | ICD-10-CM | POA: Diagnosis not present

## 2018-06-24 DIAGNOSIS — I10 Essential (primary) hypertension: Secondary | ICD-10-CM | POA: Diagnosis not present

## 2018-06-24 DIAGNOSIS — Z79899 Other long term (current) drug therapy: Secondary | ICD-10-CM

## 2018-06-24 NOTE — Telephone Encounter (Signed)
Finally rec'd approval from insurance to schedule sleep study.  Called pt today and spoke with son.  He stated that patient was no longer under the care of Dr. Rexene Alberts and that pt is with [facility] neurology. Son refused to schedule at this time.

## 2018-06-25 DIAGNOSIS — R2689 Other abnormalities of gait and mobility: Secondary | ICD-10-CM | POA: Diagnosis not present

## 2018-06-25 DIAGNOSIS — N179 Acute kidney failure, unspecified: Secondary | ICD-10-CM | POA: Diagnosis not present

## 2018-06-25 DIAGNOSIS — D696 Thrombocytopenia, unspecified: Secondary | ICD-10-CM | POA: Diagnosis not present

## 2018-06-25 DIAGNOSIS — Z5181 Encounter for therapeutic drug level monitoring: Secondary | ICD-10-CM | POA: Diagnosis not present

## 2018-06-25 DIAGNOSIS — E119 Type 2 diabetes mellitus without complications: Secondary | ICD-10-CM | POA: Diagnosis not present

## 2018-06-25 DIAGNOSIS — B004 Herpesviral encephalitis: Secondary | ICD-10-CM | POA: Diagnosis not present

## 2018-06-25 DIAGNOSIS — R4189 Other symptoms and signs involving cognitive functions and awareness: Secondary | ICD-10-CM | POA: Diagnosis not present

## 2018-06-25 DIAGNOSIS — Z452 Encounter for adjustment and management of vascular access device: Secondary | ICD-10-CM | POA: Diagnosis not present

## 2018-06-25 DIAGNOSIS — I10 Essential (primary) hypertension: Secondary | ICD-10-CM | POA: Diagnosis not present

## 2018-06-25 LAB — CBC WITH DIFFERENTIAL/PLATELET
BASOS PCT: 0.2 %
Basophils Absolute: 17 cells/uL (ref 0–200)
Eosinophils Absolute: 42 cells/uL (ref 15–500)
Eosinophils Relative: 0.5 %
HEMATOCRIT: 34.4 % — AB (ref 35.0–45.0)
HEMOGLOBIN: 11.1 g/dL — AB (ref 11.7–15.5)
LYMPHS ABS: 3352 {cells}/uL (ref 850–3900)
MCH: 30.6 pg (ref 27.0–33.0)
MCHC: 32.3 g/dL (ref 32.0–36.0)
MCV: 94.8 fL (ref 80.0–100.0)
MPV: 11.1 fL (ref 7.5–12.5)
Monocytes Relative: 8.4 %
NEUTROS PCT: 51 %
Neutro Abs: 4284 cells/uL (ref 1500–7800)
PLATELETS: 289 10*3/uL (ref 140–400)
RBC: 3.63 10*6/uL — ABNORMAL LOW (ref 3.80–5.10)
RDW: 15.7 % — ABNORMAL HIGH (ref 11.0–15.0)
Total Lymphocyte: 39.9 %
WBC: 8.4 10*3/uL (ref 3.8–10.8)
WBCMIX: 706 {cells}/uL (ref 200–950)

## 2018-06-25 LAB — COMPLETE METABOLIC PANEL WITH GFR
AG Ratio: 1.3 (calc) (ref 1.0–2.5)
ALBUMIN MSPROF: 3.9 g/dL (ref 3.6–5.1)
ALKALINE PHOSPHATASE (APISO): 59 U/L (ref 33–130)
ALT: 16 U/L (ref 6–29)
AST: 17 U/L (ref 10–35)
BUN / CREAT RATIO: 8 (calc) (ref 6–22)
BUN: 9 mg/dL (ref 7–25)
CO2: 27 mmol/L (ref 20–32)
CREATININE: 1.08 mg/dL — AB (ref 0.60–0.93)
Calcium: 9.6 mg/dL (ref 8.6–10.4)
Chloride: 107 mmol/L (ref 98–110)
GFR, Est African American: 58 mL/min/{1.73_m2} — ABNORMAL LOW (ref >=60)
GFR, Est Non African American: 50 mL/min/{1.73_m2} — ABNORMAL LOW (ref >=60)
GLUCOSE: 104 mg/dL (ref 65–139)
Globulin: 3 g/dL (calc) (ref 1.9–3.7)
Potassium: 5.4 mmol/L — ABNORMAL HIGH (ref 3.5–5.3)
Sodium: 143 mmol/L (ref 135–146)
TOTAL PROTEIN: 6.9 g/dL (ref 6.1–8.1)
Total Bilirubin: 0.4 mg/dL (ref 0.2–1.2)

## 2018-06-25 LAB — IRON: Iron: 55 ug/dL (ref 45–160)

## 2018-06-29 ENCOUNTER — Telehealth: Payer: Self-pay

## 2018-06-29 DIAGNOSIS — R2689 Other abnormalities of gait and mobility: Secondary | ICD-10-CM | POA: Diagnosis not present

## 2018-06-29 DIAGNOSIS — R4189 Other symptoms and signs involving cognitive functions and awareness: Secondary | ICD-10-CM | POA: Diagnosis not present

## 2018-06-29 DIAGNOSIS — Z452 Encounter for adjustment and management of vascular access device: Secondary | ICD-10-CM | POA: Diagnosis not present

## 2018-06-29 DIAGNOSIS — E119 Type 2 diabetes mellitus without complications: Secondary | ICD-10-CM | POA: Diagnosis not present

## 2018-06-29 DIAGNOSIS — N179 Acute kidney failure, unspecified: Secondary | ICD-10-CM | POA: Diagnosis not present

## 2018-06-29 DIAGNOSIS — Z5181 Encounter for therapeutic drug level monitoring: Secondary | ICD-10-CM | POA: Diagnosis not present

## 2018-06-29 DIAGNOSIS — B004 Herpesviral encephalitis: Secondary | ICD-10-CM | POA: Diagnosis not present

## 2018-06-29 DIAGNOSIS — D696 Thrombocytopenia, unspecified: Secondary | ICD-10-CM | POA: Diagnosis not present

## 2018-06-29 DIAGNOSIS — I10 Essential (primary) hypertension: Secondary | ICD-10-CM | POA: Diagnosis not present

## 2018-06-29 NOTE — Telephone Encounter (Signed)
lucys son aware to take her to the ER

## 2018-06-29 NOTE — Telephone Encounter (Signed)
I can arrange for  Cardiology eval , but since rate is fast , this is new and she is symptomatic , I recommend eD eval

## 2018-06-29 NOTE — Telephone Encounter (Signed)
Cat with home health is calling to report elevated heart rate since yesterday. At rest is between 112-116 and when she walks it goes up to 130. Said to call her son Delilah Shan with recommendations

## 2018-06-30 ENCOUNTER — Other Ambulatory Visit: Payer: Self-pay | Admitting: Family Medicine

## 2018-06-30 DIAGNOSIS — R2689 Other abnormalities of gait and mobility: Secondary | ICD-10-CM | POA: Diagnosis not present

## 2018-06-30 DIAGNOSIS — I10 Essential (primary) hypertension: Secondary | ICD-10-CM | POA: Diagnosis not present

## 2018-06-30 DIAGNOSIS — E119 Type 2 diabetes mellitus without complications: Secondary | ICD-10-CM | POA: Diagnosis not present

## 2018-06-30 DIAGNOSIS — R4189 Other symptoms and signs involving cognitive functions and awareness: Secondary | ICD-10-CM | POA: Diagnosis not present

## 2018-06-30 DIAGNOSIS — D696 Thrombocytopenia, unspecified: Secondary | ICD-10-CM | POA: Diagnosis not present

## 2018-06-30 DIAGNOSIS — N179 Acute kidney failure, unspecified: Secondary | ICD-10-CM | POA: Diagnosis not present

## 2018-06-30 DIAGNOSIS — Z5181 Encounter for therapeutic drug level monitoring: Secondary | ICD-10-CM | POA: Diagnosis not present

## 2018-06-30 DIAGNOSIS — Z452 Encounter for adjustment and management of vascular access device: Secondary | ICD-10-CM | POA: Diagnosis not present

## 2018-06-30 DIAGNOSIS — B004 Herpesviral encephalitis: Secondary | ICD-10-CM | POA: Diagnosis not present

## 2018-07-08 ENCOUNTER — Ambulatory Visit: Payer: Medicare HMO | Admitting: Family Medicine

## 2018-07-14 DIAGNOSIS — B004 Herpesviral encephalitis: Secondary | ICD-10-CM | POA: Diagnosis not present

## 2018-07-14 DIAGNOSIS — Z7409 Other reduced mobility: Secondary | ICD-10-CM | POA: Diagnosis not present

## 2018-07-17 DIAGNOSIS — N8110 Cystocele, unspecified: Secondary | ICD-10-CM | POA: Diagnosis not present

## 2018-07-17 DIAGNOSIS — M79651 Pain in right thigh: Secondary | ICD-10-CM | POA: Diagnosis not present

## 2018-07-17 DIAGNOSIS — W19XXXA Unspecified fall, initial encounter: Secondary | ICD-10-CM | POA: Diagnosis not present

## 2018-07-17 DIAGNOSIS — R109 Unspecified abdominal pain: Secondary | ICD-10-CM | POA: Diagnosis not present

## 2018-07-18 ENCOUNTER — Encounter: Payer: Self-pay | Admitting: Family Medicine

## 2018-07-18 NOTE — Assessment & Plan Note (Signed)
Hospitalized at [facility] followed by SNF for viral meningitis, which had presented as new disabling headache and progressive lower extremity weakness with inability to care for herself. LABS ON Da of visit and medication adjustment made

## 2018-07-18 NOTE — Progress Notes (Signed)
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 Lab data is requested as indicated on d/c summary C/o mild ;light headedness at times , also excessive sleepiness/ fatigue ROS Denies recent fever or chills. Denies sinus pressure, nasal congestion, ear pain or sore throat. Denies chest congestion, productive cough or wheezing. Denies chest pains, palpitations and leg swelling Denies abdominal pain, nausea, vomiting,diarrhea or constipation.   Denies dysuria, frequency, hesitancy or incontinence. Denies joint pain, swelling and dose have  limitation in mobility. Denies headaches, seizures, numbness, or tingling. Denies depression, anxiety or insomnia. Denies skin break down or rash.   PE  BP 110/70   Pulse 97   Resp 12   Ht 5\' 4"  (1.626 m)   Wt 174 lb (78.9 kg)   SpO2 98% Comment: room air  BMI 29.87 kg/m   Patient alert and oriented and in no cardiopulmonary distress.  HEENT: No facial asymmetry, EOMI,   oropharynx pink and moist.  Neck supple no JVD, no mass.  Chest: Clear to auscultation bilaterally.  CVS: S1, S2 no murmurs, no S3.Regular rate.  ABD: Soft non tender.   Ext: No edema  MS: Adequate though reduced  ROM spine, shoulders, hips and knees.  Skin: Intact, no ulcerations or rash noted.  Psych: Good eye contact, normal affect. Memory mildly impaired not anxious or depressed appearing.  CNS: CN 2-12  intact, power,  normal throughout.decreased tone in lower extremities  [facility] discharge follow-up Hospitalized at [facility] followed by SNF for viral meningitis, which had presented as new disabling headache and progressive lower extremity weakness with inability to care for herself. LABS ON Da of visit and medication adjustment made   Diabetes mellitus type 2 in obese [facility]) resume once daily metforemin Jillian [name] is reminded of the importance of commitment to daily physical activity for 30 minutes or more, as able and the need to limit carbohydrate intake to 30 to 60 grams per meal to help with blood sugar control.   The need to take medication as prescribed, test blood sugar as directed, and to call between visits if there is a concern that blood sugar is uncontrolled is also discussed.   Jillian [name] is reminded of the importance of daily foot exam, annual eye examination, and good blood sugar, blood pressure and cholesterol control.  Diabetic Labs Latest Ref Rng & Units 06/23/2018 05/19/2018 05/17/2018 05/16/2018 02/24/2018  HbA1c 4.8 - 5.6 % - - 6.6(H) - 6.3(H)  Microalbumin Not Estab. ug/mL - - - - -  Micro/Creat Ratio 0.0 - 30.0 mg/g creat - - - - -  Chol <200 mg/dL - - - - 119  HDL >50 mg/dL - - - - 47(L)  Calc LDL mg/dL (calc) - - - - 56  Triglycerides <150 mg/dL - - - - 81  Creatinine 0.60 - 0.93 mg/dL 1.08(H) 1.17(H) 0.87 0.92 0.98(H)   BP/Weight  06/23/2018 06/14/2018 05/19/2018 05/19/2018 05/18/2018 05/17/2018 02/21/931  Systolic BP 355 732 202 542 706 237 -  Diastolic BP 70 67 83 79 80 78 -  Wt. (Lbs) 174 180 - 183 185 - 187  BMI 29.87 30.9 - 31.41 31.76 - 32.1   Foot/eye exam completion dates Latest Ref Rng & Units 04/28/2018 09/09/2017  Eye Exam No Retinopathy - -  Foot exam Order - - -  Foot Form Completion - Done Done        ESSENTIAL HYPERTENSION, BENIGN Over corrected. And symptomatic, reduce clonidine dose to once daily. DASH diet and  commitment to daily physical activity for a minimum of 30 minutes discussed and encouraged, as a part of hypertension management. The importance of attaining a healthy weight is also discussed.  BP/Weight 06/23/2018 06/14/2018 05/19/2018 05/19/2018 05/18/2018 05/17/2018 03/19/3150  Systolic BP 761 607 371 062 694 854 -  Diastolic BP 70 67 83 79 80 78 -  Wt. (Lbs) 174 180 - 183 185 - 187  BMI 29.87 30.9 - 31.41 31.76 - 32.1

## 2018-07-18 NOTE — Assessment & Plan Note (Signed)
resume once daily metforemin Jillian [name] is reminded of the importance of commitment to daily physical activity for 30 minutes or more, as able and the need to limit carbohydrate intake to 30 to 60 grams per meal to help with blood sugar control.   The need to take medication as prescribed, test blood sugar as directed, and to call between visits if there is a concern that blood sugar is uncontrolled is also discussed.   Jillian [name] is reminded of the importance of daily foot exam, annual eye examination, and good blood sugar, blood pressure and cholesterol control.  Diabetic Labs Latest Ref Rng & Units 06/23/2018 05/19/2018 05/17/2018 05/16/2018 02/24/2018  HbA1c 4.8 - 5.6 % - - 6.6(H) - 6.3(H)  Microalbumin Not Estab. ug/mL - - - - -  Micro/Creat Ratio 0.0 - 30.0 mg/g creat - - - - -  Chol <200 mg/dL - - - - 119  HDL >50 mg/dL - - - - 47(L)  Calc LDL mg/dL (calc) - - - - 56  Triglycerides <150 mg/dL - - - - 81  Creatinine 0.60 - 0.93 mg/dL 1.08(H) 1.17(H) 0.87 0.92 0.98(H)   BP/Weight 06/23/2018 06/14/2018 05/19/2018 05/19/2018 05/18/2018 05/17/2018 02/06/6159  Systolic BP 737 106 269 485 462 703 -  Diastolic BP 70 67 83 79 80 78 -  Wt. (Lbs) 174 180 - 183 185 - 187  BMI 29.87 30.9 - 31.41 31.76 - 32.1   Foot/eye exam completion dates Latest Ref Rng & Units 04/28/2018 09/09/2017  Eye Exam No Retinopathy - -  Foot exam Order - - -  Foot Form Completion - Done Done

## 2018-07-18 NOTE — Assessment & Plan Note (Signed)
Over corrected. And symptomatic, reduce clonidine dose to once daily. DASH diet and commitment to daily physical activity for a minimum of 30 minutes discussed and encouraged, as a part of hypertension management. The importance of attaining a healthy weight is also discussed.  BP/Weight 06/23/2018 06/14/2018 05/19/2018 05/19/2018 05/18/2018 05/17/2018 3/70/9643  Systolic BP 838 184 037 543 606 770 -  Diastolic BP 70 67 83 79 80 78 -  Wt. (Lbs) 174 180 - 183 185 - 187  BMI 29.87 30.9 - 31.41 31.76 - 32.1

## 2018-07-28 DIAGNOSIS — M549 Dorsalgia, unspecified: Secondary | ICD-10-CM | POA: Diagnosis not present

## 2018-07-28 DIAGNOSIS — R350 Frequency of micturition: Secondary | ICD-10-CM | POA: Diagnosis not present

## 2018-07-30 DIAGNOSIS — Z79899 Other long term (current) drug therapy: Secondary | ICD-10-CM | POA: Diagnosis not present

## 2018-07-30 DIAGNOSIS — B004 Herpesviral encephalitis: Secondary | ICD-10-CM | POA: Diagnosis not present

## 2018-07-30 DIAGNOSIS — I10 Essential (primary) hypertension: Secondary | ICD-10-CM | POA: Diagnosis not present

## 2018-07-30 DIAGNOSIS — E119 Type 2 diabetes mellitus without complications: Secondary | ICD-10-CM | POA: Diagnosis not present

## 2018-07-30 DIAGNOSIS — B1009 Other human herpesvirus encephalitis: Secondary | ICD-10-CM | POA: Diagnosis not present

## 2018-07-30 DIAGNOSIS — D696 Thrombocytopenia, unspecified: Secondary | ICD-10-CM | POA: Diagnosis not present

## 2018-07-30 DIAGNOSIS — Z792 Long term (current) use of antibiotics: Secondary | ICD-10-CM | POA: Diagnosis not present

## 2018-08-02 ENCOUNTER — Other Ambulatory Visit: Payer: Self-pay

## 2018-08-02 ENCOUNTER — Encounter (HOSPITAL_COMMUNITY): Payer: Self-pay | Admitting: Emergency Medicine

## 2018-08-02 ENCOUNTER — Emergency Department (HOSPITAL_COMMUNITY): Payer: Medicare HMO

## 2018-08-02 ENCOUNTER — Emergency Department (HOSPITAL_COMMUNITY)
Admission: EM | Admit: 2018-08-02 | Discharge: 2018-08-02 | Disposition: A | Discharge status: Home / Self Care | Payer: Medicare HMO | Attending: Emergency Medicine | Admitting: Emergency Medicine

## 2018-08-02 DIAGNOSIS — Y939 Activity, unspecified: Secondary | ICD-10-CM | POA: Insufficient documentation

## 2018-08-02 DIAGNOSIS — Y929 Unspecified place or not applicable: Secondary | ICD-10-CM | POA: Diagnosis not present

## 2018-08-02 DIAGNOSIS — Z79899 Other long term (current) drug therapy: Secondary | ICD-10-CM | POA: Insufficient documentation

## 2018-08-02 DIAGNOSIS — S29011A Strain of muscle and tendon of front wall of thorax, initial encounter: Secondary | ICD-10-CM

## 2018-08-02 DIAGNOSIS — E119 Type 2 diabetes mellitus without complications: Secondary | ICD-10-CM | POA: Insufficient documentation

## 2018-08-02 DIAGNOSIS — Y999 Unspecified external cause status: Secondary | ICD-10-CM | POA: Insufficient documentation

## 2018-08-02 DIAGNOSIS — S29001A Unspecified injury of muscle and tendon of front wall of thorax, initial encounter: Secondary | ICD-10-CM | POA: Diagnosis present

## 2018-08-02 DIAGNOSIS — Z7982 Long term (current) use of aspirin: Secondary | ICD-10-CM | POA: Insufficient documentation

## 2018-08-02 DIAGNOSIS — W19XXXA Unspecified fall, initial encounter: Secondary | ICD-10-CM | POA: Diagnosis not present

## 2018-08-02 DIAGNOSIS — I10 Essential (primary) hypertension: Secondary | ICD-10-CM | POA: Diagnosis not present

## 2018-08-02 DIAGNOSIS — R0789 Other chest pain: Secondary | ICD-10-CM | POA: Diagnosis not present

## 2018-08-02 MED ORDER — CYCLOBENZAPRINE HCL 5 MG PO TABS: 10.0000 mg | ORAL_TABLET | Freq: Two times a day (BID) | ORAL | 0 refills | Status: DC | PRN

## 2018-08-02 NOTE — ED Provider Notes (Signed)
[facility] EMERGENCY DEPARTMENT Provider Note   CSN: [id number] Arrival date & time: 08/02/18  0932     History   Chief Complaint Chief Complaint  Patient presents with  . Back Pain    HPI Jillian [name] is a 77 y.o. female.  HPI Patient presents to the emergency room for evaluation of left thoracic back pain.  Patient states she started to have the symptoms about a week ago.  Pain is in the posterior left lower thoracic region.  She denies any trouble with fevers or cough.  She fell several weeks ago but of her symptoms resolved from that fall and she was feeling fine for a couple weeks until this pain 1 week ago.  The area is tender to touch.  Denies any dysuria or hematuria.  She went to an urgent care and they did a urinalysis that was normal.  Patient has been taking Tylenol ibuprofen but she continues to have pain. Past Medical History:  Diagnosis Date  . Chronic fatigue   . Diabetes mellitus 2011  . DJD (degenerative joint disease)    of neck    . Dyslipidemia   . 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 Hypertension 2005  . Mild obesity   . Postmenopausal   . Viral meningitis   . Wears glasses     Patient Active Problem List   Diagnosis Date Noted  . Hospital discharge follow-up 05/18/2018  . Dizziness 05/17/2018  . 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 Back pain with right-sided radiculopathy 04/28/2018  . Longitudinal split nail 02/15/2018  . Chronic left SI joint pain 05/21/2016  . Primary osteoarthritis of both knees 06/26/2015  . 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 Thoracic spine pain 09/28/2014  . Hip pain 08/06/2012  . 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 Hot flashes, menopausal 09/10/2011  . 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 INSOMNIA, CHRONIC 11/28/2009  . Hyperlipidemia with target LDL less than 100 10/12/2007    . Obesity (BMI 30.0-34.9) 10/12/2007  . ESSENTIAL HYPERTENSION, BENIGN 10/12/2007    Past Surgical History:  Procedure Laterality Date  . ABDOMINAL HYSTERECTOMY  1992   fibroids  . APPENDECTOMY    . BREAST EXCISIONAL BIOPSY Right    benign  . BREAST LUMPECTOMY WITH RADIOACTIVE SEED LOCALIZATION Right 10/05/2014   Procedure: BREAST LUMPECTOMY WITH RADIOACTIVE SEED LOCALIZATION;  Surgeon: Erroll Luna, MD;  Location: [place];  Service: General;  Laterality: Right;  . cataract Bilateral   . CYST EXCISION Right    arm  . EYE SURGERY Right 06/07/2013   cataract  . EYE SURGERY Left 09/14/2013   cataract  . KNEE ARTHROSCOPY Right 02/26/2016   Procedure: ARTHROSCOPY RIGHT KNEE WITH MENSICAL DEBRIDEMENT;  Surgeon: Gaynelle Arabian, MD;  [facility];  Service: Orthopedics;  Laterality: Right;  . left breast biopsy    . LESION REMOVAL Right 03/30/2013   Procedure: EXCISION OF NEOPLASM ARM;  Surgeon: Jamesetta So, MD;  Location: [facility];  Service: General;  Laterality: Right;  . TUBAL LIGATION       OB History    Gravida  2   Para  2   Term  2   Preterm      AB      Living        SAB      TAB  Ectopic      Multiple      Live Births               Home Medications    Prior to Admission medications   Medication Sig Start Date End Date Taking? Authorizing Provider  acetaminophen (TYLENOL) 500 MG tablet Take 1,000 mg by mouth every 6 (six) hours as needed for mild pain.   Yes [provider]  aspirin EC 81 MG tablet Take 1 tablet (81 mg total) by mouth every evening. Patient taking differently: Take 81 mg by mouth every morning.  04/23/16  Yes Fayrene Helper, MD  CALCIUM PO Take 1 tablet by mouth daily.   Yes [provider]  cloNIDine (CATAPRES) 0.1 MG tablet Take one tablet once daily at bedtime Patient taking differently: daily. Take one tablet once daily at bedtime 06/23/18  Yes Fayrene Helper, MD  FORTAMET 500 MG  24 hr tablet TAKE 1 TABLET  DAILY WITH BREAKFAST. 07/02/18  Yes Fayrene Helper, MD  pravastatin (PRAVACHOL) 10 MG tablet Take 1 tablet (10 mg total) by mouth daily. 06/23/18  Yes Fayrene Helper, MD  tamoxifen (NOLVADEX) 20 MG tablet Take 1 tablet (20 mg total) by mouth daily. 04/30/18  Yes Higgs, Mathis Dad, MD  cyclobenzaprine (FLEXERIL) 5 MG tablet Take 2 tablets (10 mg total) by mouth 2 (two) times daily as needed for muscle spasms. 08/02/18   Dorie Rank, MD    Family History Family History  Problem Relation Age of Onset  . Hypertension Father   . Congestive Heart Failure Father   . Liver cancer Mother   . Hypertension Sister   . Hypertension Brother   . Diabetes Brother     Social History Social History   Tobacco Use  . Smoking status: Never Smoker  . Smokeless tobacco: Never Used  Substance Use Topics  . Alcohol use: No  . Drug use: No     Allergies   Codeine and Propoxyphene n-acetaminophen   Review of Systems Review of Systems  All other systems reviewed and are negative.    Physical Exam Updated Vital Signs BP 133/90 (BP Location: Right Arm)   Pulse 92   Temp 98 F (36.7 C) (Oral)   Resp 20   Ht 1.626 m (5\' 4" )   Wt 79.4 kg   SpO2 97%   BMI 30.04 kg/m   Physical Exam  Constitutional: She appears well-developed and well-nourished. No distress.  HENT:  Head: Normocephalic and atraumatic.  Right Ear: External ear normal.  Left Ear: External ear normal.  Eyes: Conjunctivae are normal. Right eye exhibits no discharge. Left eye exhibits no discharge. No scleral icterus.  Neck: Neck supple. No tracheal deviation present.  Cardiovascular: Normal rate.  Pulmonary/Chest: Effort normal. No stridor. No respiratory distress.  ttp left posterior rib, no crepitus  Abdominal: She exhibits no distension.  Musculoskeletal: She exhibits no edema.  Neurological: She is alert. Cranial nerve deficit: no gross deficits.  Skin: Skin is warm and dry. No rash noted.    Psychiatric: She has a normal mood and affect.  Nursing note and vitals reviewed.    ED Treatments / Results  Labs (all labs ordered are listed, but only abnormal results are displayed) Labs Reviewed - No data to display  EKG None  Radiology Dg Ribs Unilateral W/chest Left  Result Date: 08/02/2018 CLINICAL DATA:  Left-sided chest pain for 2 weeks, no known injury, initial encounter EXAM: LEFT RIBS AND CHEST - 3+ VIEW  COMPARISON:  06/14/2018 FINDINGS: Cardiac shadows within normal limits. The lungs are well aerated bilaterally. No focal infiltrate or sizable effusion is seen. No acute rib fracture is noted. No pneumothorax is noted. IMPRESSION: No acute abnormality noted. Electronically Signed   By: Inez Catalina M.D.   On: 08/02/2018 10:33    Procedures Procedures (including critical care time)  Medications Ordered in ED Medications - No data to display   Initial Impression / Assessment and Plan / ED Course  I have reviewed the triage vital signs and the nursing notes.  Pertinent labs & imaging results that were available during my care of the patient were reviewed by me and considered in my medical decision making (see chart for details).   Sx are suggestive of a musculoskeletal etiology.  No breathing difficulties.  No abdominal pain.  Doubt cardiac or pulmonary etiology.  Doubt abdominal etiology.  Recently had her urine checked and no dysuria.  Dc home with flexeril.  OTC meds prn.  Follow up with PCP as needed or if not improving  Final Clinical Impressions(s) / ED Diagnoses   Final diagnoses:  Intercostal muscle strain, initial encounter    ED Discharge Orders         Ordered    cyclobenzaprine (FLEXERIL) 5 MG tablet  2 times daily PRN     08/02/18 1055           Dorie Rank, MD 08/02/18 1057

## 2018-08-02 NOTE — ED Triage Notes (Signed)
Patient complaining of left sided back pain x 1 week. States she went to [facility] on Friday and they did urinalysis that was "fine." Complains of continuing pain.

## 2018-08-02 NOTE — Discharge Instructions (Addendum)
Take the medications as prescribed, the flexeril can make you feel drowsy so be careful the first time you take it, you can also take tylenol or ibuprofen for pain, topical medications such as icy hot can also help

## 2018-08-09 ENCOUNTER — Other Ambulatory Visit: Payer: Self-pay

## 2018-08-09 ENCOUNTER — Emergency Department (HOSPITAL_COMMUNITY): Payer: Medicare HMO

## 2018-08-09 ENCOUNTER — Emergency Department (HOSPITAL_COMMUNITY)
Admission: EM | Admit: 2018-08-09 | Discharge: 2018-08-09 | Disposition: A | Discharge status: Home / Self Care | Payer: Medicare HMO | Attending: Emergency Medicine | Admitting: Emergency Medicine

## 2018-08-09 ENCOUNTER — Encounter (HOSPITAL_COMMUNITY): Payer: Self-pay | Admitting: Emergency Medicine

## 2018-08-09 DIAGNOSIS — R1030 Lower abdominal pain, unspecified: Secondary | ICD-10-CM | POA: Diagnosis not present

## 2018-08-09 DIAGNOSIS — Z7982 Long term (current) use of aspirin: Secondary | ICD-10-CM | POA: Insufficient documentation

## 2018-08-09 DIAGNOSIS — Z79899 Other long term (current) drug therapy: Secondary | ICD-10-CM | POA: Diagnosis not present

## 2018-08-09 DIAGNOSIS — I10 Essential (primary) hypertension: Secondary | ICD-10-CM | POA: Insufficient documentation

## 2018-08-09 DIAGNOSIS — E1121 Type 2 diabetes mellitus with diabetic nephropathy: Secondary | ICD-10-CM | POA: Diagnosis not present

## 2018-08-09 LAB — BASIC METABOLIC PANEL
Anion gap: 8 (ref 5–15)
BUN: 14 mg/dL (ref 8–23)
CALCIUM: 9 mg/dL (ref 8.9–10.3)
CO2: 24 mmol/L (ref 22–32)
CREATININE: 1.02 mg/dL — AB (ref 0.44–1.00)
Chloride: 108 mmol/L (ref 98–111)
GFR calc Af Amer: >60 mL/min (ref >60)
GFR, EST NON AFRICAN AMERICAN: 52 mL/min — AB (ref >60)
GLUCOSE: 131 mg/dL — AB (ref 70–99)
Potassium: 3.2 mmol/L — ABNORMAL LOW (ref 3.5–5.1)
SODIUM: 140 mmol/L (ref 135–145)

## 2018-08-09 LAB — URINALYSIS, ROUTINE W REFLEX MICROSCOPIC
BILIRUBIN URINE: NEGATIVE
Bacteria, UA: NONE SEEN
GLUCOSE, UA: NEGATIVE mg/dL
KETONES UR: 5 mg/dL — AB
LEUKOCYTES UA: NEGATIVE
NITRITE: NEGATIVE
PH: 5 (ref 5.0–8.0)
Protein, ur: NEGATIVE mg/dL
Specific Gravity, Urine: 1.027 (ref 1.005–1.030)

## 2018-08-09 LAB — CBC WITH DIFFERENTIAL/PLATELET
Abs Immature Granulocytes: 0.07 10*3/uL (ref 0.00–0.07)
Basophils Absolute: 0 10*3/uL (ref 0.0–0.1)
Basophils Relative: 0 %
EOS ABS: 0 10*3/uL (ref 0.0–0.5)
EOS PCT: 1 %
HCT: 36.2 % (ref 36.0–46.0)
Hemoglobin: 11.1 g/dL — ABNORMAL LOW (ref 12.0–15.0)
Immature Granulocytes: 1 %
LYMPHS ABS: 3 10*3/uL (ref 0.7–4.0)
Lymphocytes Relative: 41 %
MCH: 31.1 pg (ref 26.0–34.0)
MCHC: 30.7 g/dL (ref 30.0–36.0)
MCV: 101.4 fL — AB (ref 80.0–100.0)
MONOS PCT: 6 %
Monocytes Absolute: 0.4 10*3/uL (ref 0.1–1.0)
NEUTROS PCT: 51 %
Neutro Abs: 3.8 10*3/uL (ref 1.7–7.7)
PLATELETS: 196 10*3/uL (ref 150–400)
RBC: 3.57 MIL/uL — ABNORMAL LOW (ref 3.87–5.11)
RDW: 13.7 % (ref 11.5–15.5)
WBC: 7.3 10*3/uL (ref 4.0–10.5)
nRBC: 0 % (ref 0.0–0.2)

## 2018-08-09 LAB — HEPATIC FUNCTION PANEL
ALT: 14 U/L (ref 0–44)
AST: 19 U/L (ref 15–41)
Albumin: 3.7 g/dL (ref 3.5–5.0)
Alkaline Phosphatase: 47 U/L (ref 38–126)
BILIRUBIN DIRECT: 0.1 mg/dL (ref 0.0–0.2)
BILIRUBIN INDIRECT: 0.3 mg/dL (ref 0.3–0.9)
Total Bilirubin: 0.4 mg/dL (ref 0.3–1.2)
Total Protein: 7.3 g/dL (ref 6.5–8.1)

## 2018-08-09 MED ORDER — DICYCLOMINE HCL 20 MG PO TABS: 20.0000 mg | ORAL_TABLET | Freq: Two times a day (BID) | ORAL | 0 refills | Status: DC | PRN

## 2018-08-09 MED ORDER — DICYCLOMINE HCL 10 MG PO CAPS
10.0000 mg | ORAL_CAPSULE | Freq: Once | ORAL | Status: AC
  Administered 2018-08-09: 10 mg via ORAL
  Filled 2018-08-09: qty 1

## 2018-08-09 NOTE — ED Provider Notes (Signed)
[facility] EMERGENCY DEPARTMENT Provider Note   CSN: [id number] Arrival date & time: 08/09/18  1326     History   Chief Complaint Chief Complaint  Patient presents with  . Abdominal Pain    HPI Jillian [name] is a 77 y.o. female.  HPI Patient presents with 1 week of lower abdominal discomfort.  She denies any dysuria, gross hematuria, frequency or urgency.  Denies nausea, vomiting, diarrhea or constipation.  Was seen in urgent care and told that she had blood in the urine and eyes to go to the emergency department.  Patient states that her pain has significantly improved especially with lying down. Past Medical History:  Diagnosis Date  . Chronic fatigue   . Diabetes mellitus 2011  . DJD (degenerative joint disease)    of neck    . Dyslipidemia   . 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 Hypertension 2005  . Mild obesity   . Postmenopausal   . Viral meningitis   . Wears glasses     Patient Active Problem List   Diagnosis Date Noted  . Hospital discharge follow-up 05/18/2018  . Dizziness 05/17/2018  . Diabetes mellitus type 2 in obese ([place]) 05/06/2018  . Back pain with right-sided radiculopathy 04/28/2018  . Longitudinal split nail 02/15/2018  . Chronic left SI joint pain 05/21/2016  . Primary osteoarthritis of both knees 06/26/2015  . 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 Thoracic spine pain 09/28/2014  . Hip pain 08/06/2012  . 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 Hot flashes, menopausal 09/10/2011  . 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 INSOMNIA, CHRONIC 11/28/2009  . Hyperlipidemia with target LDL less than 100 10/12/2007  . Obesity (BMI 30.0-34.9) 10/12/2007  . ESSENTIAL HYPERTENSION, BENIGN 10/12/2007    Past Surgical History:  Procedure Laterality Date  . ABDOMINAL HYSTERECTOMY  1992   fibroids  . APPENDECTOMY    . BREAST EXCISIONAL  BIOPSY Right    benign  . BREAST LUMPECTOMY WITH RADIOACTIVE SEED LOCALIZATION Right 10/05/2014   Procedure: BREAST LUMPECTOMY WITH RADIOACTIVE SEED LOCALIZATION;  Surgeon: Erroll Luna, MD;  Location: [place];  Service: General;  Laterality: Right;  . cataract Bilateral   . CYST EXCISION Right    arm  . EYE SURGERY Right 06/07/2013   cataract  . EYE SURGERY Left 09/14/2013   cataract  . KNEE ARTHROSCOPY Right 02/26/2016   Procedure: ARTHROSCOPY RIGHT KNEE WITH MENSICAL DEBRIDEMENT;  Surgeon: Gaynelle Arabian, MD;  [facility];  Service: Orthopedics;  Laterality: Right;  . left breast biopsy    . LESION REMOVAL Right 03/30/2013   Procedure: EXCISION OF NEOPLASM ARM;  Surgeon: Jamesetta So, MD;  Location: [facility];  Service: General;  Laterality: Right;  . TUBAL LIGATION       OB History    Gravida  2   Para  2   Term  2   Preterm      AB      Living        SAB      TAB      Ectopic      Multiple      Live Births               Home Medications    Prior to Admission medications   Medication Sig Start Date End Date Taking? Authorizing Provider  acetaminophen (TYLENOL) 500 MG tablet Take 1,000 mg by mouth every 6 (six) hours as needed for mild pain.   Yes [provider]  aspirin EC 81 MG tablet Take 1 tablet (81 mg total) by mouth every evening. Patient taking differently: Take 81 mg by mouth every morning.  04/23/16  Yes Fayrene Helper, MD  CALCIUM PO Take 1 tablet by mouth daily.   Yes [provider]  cloNIDine (CATAPRES) 0.1 MG tablet Take one tablet once daily at bedtime Patient taking differently: daily. Take one tablet once daily at bedtime 06/23/18  Yes Fayrene Helper, MD  cyclobenzaprine (FLEXERIL) 5 MG tablet Take 2 tablets (10 mg total) by mouth 2 (two) times daily as needed for muscle spasms. 08/02/18  Yes Dorie Rank, MD  FORTAMET 500 MG 24 hr tablet TAKE 1 TABLET  DAILY WITH BREAKFAST. 07/02/18  Yes  Fayrene Helper, MD  pravastatin (PRAVACHOL) 10 MG tablet Take 1 tablet (10 mg total) by mouth daily. 06/23/18  Yes Fayrene Helper, MD  tamoxifen (NOLVADEX) 20 MG tablet Take 1 tablet (20 mg total) by mouth daily. 04/30/18  Yes Higgs, Mathis Dad, MD  dicyclomine (BENTYL) 20 MG tablet Take 1 tablet (20 mg total) by mouth 2 (two) times daily as needed for spasms. 08/09/18   Julianne Rice, MD    Family History Family History  Problem Relation Age of Onset  . Hypertension Father   . Congestive Heart Failure Father   . Liver cancer Mother   . Hypertension Sister   . Hypertension Brother   . Diabetes Brother     Social History Social History   Tobacco Use  . Smoking status: Never Smoker  . Smokeless tobacco: Never Used  Substance Use Topics  . Alcohol use: No  . Drug use: No     Allergies   Codeine and Propoxyphene n-acetaminophen   Review of Systems Review of Systems  Constitutional: Negative for chills and fever.  Respiratory: Negative for cough and shortness of breath.   Cardiovascular: Negative for chest pain.  Gastrointestinal: Positive for abdominal pain. Negative for constipation, diarrhea, nausea and vomiting.  Genitourinary: Negative for difficulty urinating, dysuria, flank pain, frequency, hematuria, pelvic pain, vaginal bleeding and vaginal discharge.  Musculoskeletal: Negative for back pain, myalgias and neck pain.  Skin: Negative for rash and wound.  Neurological: Negative for dizziness, weakness, light-headedness and numbness.  All other systems reviewed and are negative.    Physical Exam Updated Vital Signs BP (!) 133/94   Pulse 93   Temp 97.7 F (36.5 C) (Oral)   Resp 16   Ht 5\' 4"  (1.626 m)   Wt 79.8 kg   SpO2 99%   BMI 30.21 kg/m   Physical Exam  Constitutional: She is oriented to person, place, and time. She appears well-developed and well-nourished. No distress.  HENT:  Head: Normocephalic and atraumatic.  Mouth/Throat: Oropharynx is  clear and moist.  Eyes: Pupils are equal, round, and reactive to light. EOM are normal.  Neck: Normal range of motion. Neck supple.  Cardiovascular: Normal rate and regular rhythm.  Pulmonary/Chest: Effort normal and breath sounds normal.  Abdominal: Soft. Bowel sounds are normal. There is tenderness. There is no rebound and no guarding.  Very mild suprapubic discomfort.  No rebound or guarding.  Musculoskeletal: Normal range of motion. She exhibits no edema or tenderness.  No CVA tenderness bilaterally.  No midline thoracic or lumbar tenderness.  Neurological: She is alert and oriented to person, place, and time.  Moves all extremities without focal deficit.  Sensation fully intact.  Distal pulses intact.  Skin: Skin is warm and dry. No rash noted. No erythema.  Psychiatric: She has a normal mood and affect. Her behavior is normal.  Nursing note and vitals reviewed.    ED Treatments / Results  Labs (all labs ordered are listed, but only abnormal results are displayed) Labs Reviewed  URINALYSIS, ROUTINE W REFLEX MICROSCOPIC - Abnormal; Notable for the following components:      Result Value   APPearance HAZY (*)    Hgb urine dipstick MODERATE (*)    Ketones, ur 5 (*)    All other components within normal limits  CBC WITH DIFFERENTIAL/PLATELET - Abnormal; Notable for the following components:   RBC 3.57 (*)    Hemoglobin 11.1 (*)    MCV 101.4 (*)    All other components within normal limits  BASIC METABOLIC PANEL - Abnormal; Notable for the following components:   Potassium 3.2 (*)    Glucose, Bld 131 (*)    Creatinine, Ser 1.02 (*)    GFR calc non Af Amer 52 (*)    All other components within normal limits  HEPATIC FUNCTION PANEL    EKG None  Radiology Ct Renal Stone Study  Result Date: 08/09/2018 CLINICAL DATA:  Lower abdominal pain for 1 week. EXAM: CT ABDOMEN AND PELVIS WITHOUT CONTRAST TECHNIQUE: Multidetector CT imaging of the abdomen and pelvis was performed  following the standard protocol without IV contrast. COMPARISON:  03/31/2016 FINDINGS: Lower chest: No acute abnormality. Nodule within the lingula is unchanged measuring 4 mm. Likely benign. Hepatobiliary: No focal liver abnormality is seen. No gallstones, gallbladder wall thickening, or biliary dilatation. Pancreas: Unremarkable. No pancreatic ductal dilatation or surrounding inflammatory changes. Spleen: Normal in size without focal abnormality. Adrenals/Urinary Tract: Normal adrenal glands. Bilateral kidney cysts are identified which are incompletely characterized without IV contrast material. No kidney stones or hydronephrosis identified bilaterally. Urinary bladder appears normal. No bladder stones. Stomach/Bowel: Stomach normal. The small bowel loops have a normal course and caliber without obstruction. There is extensive colonic diverticulosis without acute inflammation. No pathologic dilatation of the colon. Vascular/Lymphatic: Normal appearance of the abdominal aorta. No enlarged abdominal or pelvic lymph nodes. Reproductive: Status post hysterectomy. No adnexal masses. Other: No abdominal wall hernia or abnormality. No abdominopelvic ascites. Musculoskeletal: No acute or significant osseous findings. IMPRESSION: 1. No acute findings within the abdomen or pelvis. 2. No nephrolithiasis or hydronephrosis identified. No ureteral calculi noted. 3. Bilateral kidney cysts. Incompletely characterized without IV contrast. Electronically Signed   By: Kerby Moors M.D.   On: 08/09/2018 18:50    Procedures Procedures (including critical care time)  Medications Ordered in ED Medications  dicyclomine (BENTYL) capsule 10 mg (has no administration in time range)     Initial Impression / Assessment and Plan / ED Course  I have reviewed the triage vital signs and the nursing notes.  Pertinent labs & imaging results that were available during my care of the patient were reviewed by me and considered in my  medical decision making (see chart for details).    CT abdomen pelvis without acute findings.  No evidence of white blood cells or red blood cells on microscopy of the urine.  Laboratory work-up is normal.  Patient remains asymptomatic.  Suspect possible bowel versus bladder spasms.  Advised to follow-up with her primary physician for possible referral as needed.  Strict return precautions given.   Final Clinical Impressions(s) / ED Diagnoses  Final diagnoses:  Lower abdominal pain    ED Discharge Orders         Ordered    dicyclomine (BENTYL) 20 MG tablet  2 times daily PRN     08/09/18 1906           Julianne Rice, MD 08/09/18 1907

## 2018-08-09 NOTE — Discharge Instructions (Addendum)
Follow-up with your primary physician.  If your symptoms persist, you may need a referral to urology.  Return immediately for any worsening of your symptoms, fever, obvious blood in your urine or any concerns.

## 2018-08-09 NOTE — ED Triage Notes (Signed)
Patient complaining of lower abdominal pain for over a week. States she went to [facility] and was told she had blood in her urine and recommended she go to ER.

## 2018-08-10 ENCOUNTER — Other Ambulatory Visit (HOSPITAL_COMMUNITY): Payer: Self-pay | Admitting: *Deleted

## 2018-08-10 DIAGNOSIS — D241 Benign neoplasm of right breast: Secondary | ICD-10-CM

## 2018-08-10 DIAGNOSIS — D649 Anemia, unspecified: Secondary | ICD-10-CM

## 2018-08-11 ENCOUNTER — Inpatient Hospital Stay (HOSPITAL_COMMUNITY): Payer: Medicare HMO | Attending: Hematology

## 2018-08-11 DIAGNOSIS — N951 Menopausal and female climacteric states: Secondary | ICD-10-CM | POA: Insufficient documentation

## 2018-08-11 DIAGNOSIS — R531 Weakness: Secondary | ICD-10-CM | POA: Diagnosis not present

## 2018-08-11 DIAGNOSIS — D241 Benign neoplasm of right breast: Secondary | ICD-10-CM | POA: Diagnosis not present

## 2018-08-11 DIAGNOSIS — N6091 Unspecified benign mammary dysplasia of right breast: Secondary | ICD-10-CM | POA: Diagnosis not present

## 2018-08-11 DIAGNOSIS — D649 Anemia, unspecified: Secondary | ICD-10-CM | POA: Diagnosis not present

## 2018-08-11 DIAGNOSIS — Z79810 Long term (current) use of selective estrogen receptor modulators (SERMs): Secondary | ICD-10-CM | POA: Insufficient documentation

## 2018-08-11 DIAGNOSIS — R5383 Other fatigue: Secondary | ICD-10-CM | POA: Diagnosis not present

## 2018-08-11 DIAGNOSIS — A879 Viral meningitis, unspecified: Secondary | ICD-10-CM | POA: Diagnosis not present

## 2018-08-11 LAB — CBC WITH DIFFERENTIAL/PLATELET
Abs Immature Granulocytes: 0.03 10*3/uL (ref 0.00–0.07)
BASOS ABS: 0 10*3/uL (ref 0.0–0.1)
Basophils Relative: 0 %
EOS ABS: 0 10*3/uL (ref 0.0–0.5)
EOS PCT: 1 %
HEMATOCRIT: 35.1 % — AB (ref 36.0–46.0)
HEMOGLOBIN: 10.8 g/dL — AB (ref 12.0–15.0)
Immature Granulocytes: 1 %
LYMPHS ABS: 2.3 10*3/uL (ref 0.7–4.0)
LYMPHS PCT: 35 %
MCH: 30.5 pg (ref 26.0–34.0)
MCHC: 30.8 g/dL (ref 30.0–36.0)
MCV: 99.2 fL (ref 80.0–100.0)
MONO ABS: 0.4 10*3/uL (ref 0.1–1.0)
Monocytes Relative: 7 %
NRBC: 0 % (ref 0.0–0.2)
Neutro Abs: 3.7 10*3/uL (ref 1.7–7.7)
Neutrophils Relative %: 56 %
Platelets: 180 10*3/uL (ref 150–400)
RBC: 3.54 MIL/uL — ABNORMAL LOW (ref 3.87–5.11)
RDW: 13.6 % (ref 11.5–15.5)
WBC: 6.5 10*3/uL (ref 4.0–10.5)

## 2018-08-11 LAB — COMPREHENSIVE METABOLIC PANEL
ALBUMIN: 3.6 g/dL (ref 3.5–5.0)
ALT: 13 U/L (ref 0–44)
ANION GAP: 9 (ref 5–15)
AST: 21 U/L (ref 15–41)
Alkaline Phosphatase: 45 U/L (ref 38–126)
BUN: 11 mg/dL (ref 8–23)
CHLORIDE: 109 mmol/L (ref 98–111)
CO2: 23 mmol/L (ref 22–32)
Calcium: 8.9 mg/dL (ref 8.9–10.3)
Creatinine, Ser: 0.85 mg/dL (ref 0.44–1.00)
GFR calc Af Amer: >60 mL/min (ref >60)
GFR calc non Af Amer: >60 mL/min (ref >60)
GLUCOSE: 120 mg/dL — AB (ref 70–99)
POTASSIUM: 3.5 mmol/L (ref 3.5–5.1)
Sodium: 141 mmol/L (ref 135–145)
TOTAL PROTEIN: 6.9 g/dL (ref 6.5–8.1)
Total Bilirubin: 0.4 mg/dL (ref 0.3–1.2)

## 2018-08-11 LAB — IRON AND TIBC
Iron: 68 ug/dL (ref 28–170)
SATURATION RATIOS: 26 % (ref 10.4–31.8)
TIBC: 259 ug/dL (ref 250–450)
UIBC: 191 ug/dL

## 2018-08-11 LAB — FERRITIN: Ferritin: 145 ng/mL (ref 11–307)

## 2018-08-11 LAB — LACTATE DEHYDROGENASE: LDH: 134 U/L (ref 98–192)

## 2018-08-18 ENCOUNTER — Inpatient Hospital Stay (HOSPITAL_BASED_OUTPATIENT_CLINIC_OR_DEPARTMENT_OTHER): Payer: Medicare HMO | Admitting: Hematology

## 2018-08-18 ENCOUNTER — Ambulatory Visit (HOSPITAL_COMMUNITY): Payer: Medicare HMO | Admitting: Hematology

## 2018-08-18 ENCOUNTER — Encounter (HOSPITAL_COMMUNITY): Payer: Self-pay | Admitting: Hematology

## 2018-08-18 DIAGNOSIS — N951 Menopausal and female climacteric states: Secondary | ICD-10-CM | POA: Diagnosis not present

## 2018-08-18 DIAGNOSIS — D649 Anemia, unspecified: Secondary | ICD-10-CM

## 2018-08-18 DIAGNOSIS — Z79810 Long term (current) use of selective estrogen receptor modulators (SERMs): Secondary | ICD-10-CM | POA: Diagnosis not present

## 2018-08-18 DIAGNOSIS — D241 Benign neoplasm of right breast: Secondary | ICD-10-CM | POA: Diagnosis not present

## 2018-08-18 DIAGNOSIS — R5383 Other fatigue: Secondary | ICD-10-CM

## 2018-08-18 DIAGNOSIS — N6091 Unspecified benign mammary dysplasia of right breast: Secondary | ICD-10-CM

## 2018-08-18 DIAGNOSIS — R531 Weakness: Secondary | ICD-10-CM | POA: Diagnosis not present

## 2018-08-18 NOTE — Patient Instructions (Signed)
[place] at [facility] Discharge Instructions Follow up in 6 months with Mammogram in March.   Thank you for choosing [place] at [facility] to provide your oncology and hematology care.  To afford each patient quality time with our provider, please arrive at least 15 minutes before your scheduled appointment time.   If you have a lab appointment with the [facility] please come in thru the  Main Entrance and check in at the main information desk  You need to re-schedule your appointment should you arrive 10 or more minutes late.  We strive to give you quality time with our providers, and arriving late affects you and other patients whose appointments are after yours.  Also, if you no show three or more times for appointments you may be dismissed from the clinic at the providers discretion.     Again, thank you for choosing [facility].  Our hope is that these requests will decrease the amount of time that you wait before being seen by our physicians.       _____________________________________________________________  Should you have questions after your visit to [facility], please contact our office at [phone] between the hours of 8:00 a.m. and 4:30 p.m.  Voicemails left after 4:00 p.m. will not be returned until the following business day.  For prescription refill requests, have your pharmacy contact our office and allow 72 hours.    Cancer Center Support Programs:   > Cancer Support Group  2nd Tuesday of the month 1pm-2pm, Journey Room

## 2018-08-18 NOTE — Progress Notes (Signed)
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 female returns for routine follow-up for breast cancer. She is here today with her son and she is doing well. She is tolerating the medications well. She does have daily hot flashes that are manageable. She is fatigued and weak throughout the day. She had viral meningitis in August and she has not fully recovered as far as her strength. She denies nausea, vomiting, or diarrhea. Denies any new pains or lumps present. Denies any SOB or new cough. Denies any bleeding or easy bruising. She reports her appetite and energy level at 75%. She is maintains her weight well.   REVIEW OF SYSTEMS:  Review of Systems  Constitutional: Positive for fatigue.  Cardiovascular: Positive for chest pain.  Gastrointestinal: Positive for abdominal pain.  Neurological: Positive for numbness.  Psychiatric/Behavioral: Positive for sleep disturbance.  All other systems reviewed and are negative.    PAST MEDICAL/SURGICAL HISTORY:  Past Medical History:  Diagnosis Date  . Chronic fatigue   . Diabetes mellitus 2011  . DJD (degenerative joint disease)    of neck    . Dyslipidemia   . 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 Hypertension 2005  . Mild obesity   . Postmenopausal   . Viral meningitis   . Wears glasses    Past Surgical History:  Procedure Laterality Date  . ABDOMINAL HYSTERECTOMY  1992   fibroids  . APPENDECTOMY    . BREAST EXCISIONAL BIOPSY Right    benign  . BREAST LUMPECTOMY WITH RADIOACTIVE SEED LOCALIZATION Right 10/05/2014   Procedure: BREAST  LUMPECTOMY WITH RADIOACTIVE SEED LOCALIZATION;  Surgeon: Erroll Luna, MD;  Location: [place];  Service: General;  Laterality: Right;  . cataract Bilateral   . CYST EXCISION Right    arm  . EYE SURGERY Right 06/07/2013   cataract  . EYE SURGERY Left 09/14/2013   cataract  . KNEE ARTHROSCOPY Right 02/26/2016   Procedure: ARTHROSCOPY RIGHT KNEE WITH MENSICAL DEBRIDEMENT;  Surgeon: Gaynelle Arabian, MD;  Location: [facility];  Service: Orthopedics;  Laterality: Right;  . left breast biopsy    . LESION REMOVAL Right 03/30/2013   Procedure: EXCISION OF NEOPLASM ARM;  Surgeon: Jamesetta So, MD;  Location: [facility];  Service: General;  Laterality: Right;  . TUBAL LIGATION       SOCIAL HISTORY:  Social History   Socioeconomic History  . Marital status: Widowed    Spouse name: Not on file  . Number of children: Not on file  . Years of education: Not on file  . Highest education level: Not on file  Occupational History  . Not on file  Social Needs  . Financial resource strain: Not hard at all  . Food insecurity:    Worry: Never true    Inability: Never true  . Transportation needs:    Medical: No    Non-medical: No  Tobacco Use  . Smoking status: Never Smoker  . Smokeless tobacco: Never Used  Substance and Sexual Activity  . Alcohol use: No  . Drug use: No  . Sexual activity:  Not Currently    Birth control/protection: Surgical  Lifestyle  . Physical activity:    Days per week: 0 days    Minutes per session: 0 min  . Stress: Not at all  Relationships  . Social connections:    Talks on phone: More than three times a week    Gets together: Once a week    Attends religious service: More than 4 times per year    Active member of club or organization: Yes    Attends meetings of clubs or organizations: More than 4 times per year    Relationship status: Widowed  . Intimate partner violence:    Fear of current or ex partner: No    Emotionally abused: No    Physically  abused: No    Forced sexual activity: No  Other Topics Concern  . Not on file  Social History Narrative  . Not on file    FAMILY HISTORY:  Family History  Problem Relation Age of Onset  . Hypertension Father   . Congestive Heart Failure Father   . Liver cancer Mother   . Hypertension Sister   . Hypertension Brother   . Diabetes Brother     CURRENT MEDICATIONS:  Outpatient Encounter Medications as of 08/18/2018  Medication Sig  . acetaminophen (TYLENOL) 500 MG tablet Take 1,000 mg by mouth every 6 (six) hours as needed for mild pain.  Marland Kitchen aspirin EC 81 MG tablet Take 1 tablet (81 mg total) by mouth every evening. (Patient taking differently: Take 81 mg by mouth every morning. )  . CALCIUM PO Take 1 tablet by mouth daily.  . cloNIDine (CATAPRES) 0.1 MG tablet Take one tablet once daily at bedtime (Patient taking differently: daily. Take one tablet once daily at bedtime)  . cyclobenzaprine (FLEXERIL) 5 MG tablet Take 2 tablets (10 mg total) by mouth 2 (two) times daily as needed for muscle spasms.  Marland Kitchen dicyclomine (BENTYL) 20 MG tablet Take 1 tablet (20 mg total) by mouth 2 (two) times daily as needed for spasms.  . FORTAMET 500 MG 24 hr tablet TAKE 1 TABLET  DAILY WITH BREAKFAST.  Marland Kitchen pravastatin (PRAVACHOL) 10 MG tablet Take 1 tablet (10 mg total) by mouth daily.  . ranitidine (ZANTAC) 300 MG tablet   . tamoxifen (NOLVADEX) 20 MG tablet Take 1 tablet (20 mg total) by mouth daily.   No facility-administered encounter medications on file as of 08/18/2018.     ALLERGIES:  Allergies  Allergen Reactions  . Codeine Nausea And Vomiting  . Propoxyphene N-Acetaminophen Other (See Comments)    hallucinations     PHYSICAL EXAM:  ECOG Performance status: 1  VITAL SIGNS:BP: 131/80, P:91, R:16, T:98.1, O2:99% WEIGHT: 178.2  Physical Exam  Constitutional: She is oriented to person, place, and time. She appears well-developed and well-nourished.  Cardiovascular: Normal rate, regular  rhythm and normal heart sounds.  Pulmonary/Chest: Effort normal and breath sounds normal.  Musculoskeletal: Normal range of motion.  Neurological: She is alert and oriented to person, place, and time.  Skin: Skin is warm and dry.  Psychiatric: She has a normal mood and affect. Her behavior is normal. Judgment and thought content normal.  Breast: No palpable masses, no skin changes or nipple discharge, no adenopathy.   LABORATORY DATA:  I have reviewed the labs as listed.  CBC    Component Value Date/Time   WBC 6.5 08/11/2018 1222   RBC 3.54 (L) 08/11/2018 1222   HGB 10.8 (L) 08/11/2018 1222  HCT 35.1 (L) 08/11/2018 1222   PLT 180 08/11/2018 1222   MCV 99.2 08/11/2018 1222   MCH 30.5 08/11/2018 1222   MCHC 30.8 08/11/2018 1222   RDW 13.6 08/11/2018 1222   LYMPHSABS 2.3 08/11/2018 1222   MONOABS 0.4 08/11/2018 1222   EOSABS 0.0 08/11/2018 1222   BASOSABS 0.0 08/11/2018 1222   CMP Latest Ref Rng & Units 08/11/2018 08/09/2018 06/23/2018  Glucose 70 - 99 mg/dL 120(H) 131(H) 104  BUN 8 - 23 mg/dL 11 14 9   Creatinine 0.44 - 1.00 mg/dL 0.85 1.02(H) 1.08(H)  Sodium 135 - 145 mmol/L 141 140 143  Potassium 3.5 - 5.1 mmol/L 3.5 3.2(L) 5.4(H)  Chloride 98 - 111 mmol/L 109 108 107  CO2 22 - 32 mmol/L 23 24 27   Calcium 8.9 - 10.3 mg/dL 8.9 9.0 9.6  Total Protein 6.5 - 8.1 g/dL 6.9 7.3 6.9  Total Bilirubin 0.3 - 1.2 mg/dL 0.4 0.4 0.4  Alkaline Phos 38 - 126 U/L 45 47 -  AST 15 - 41 U/L 21 19 17   ALT 0 - 44 U/L 13 14 16        DIAGNOSTIC IMAGING:  I have independently reviewed the scans and discussed with the patient.  I have reviewed Francene Finders, NP's note and agree with the documentation.  I personally performed a face-to-face visit, made revisions and my assessment and plan is as follows.     ASSESSMENT & PLAN:   Papilloma of right breast 1.  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 - Patient  has been on tamoxifen since early part of 2016.  She is tolerating it very well.  Denies any vaginal bleeding or spotting.  Her mobility is limited from the sequelae of viral meningitis in August. - We reviewed mammogram dated 12/01/2017 which was BI-RADS Category 1. - Physical examination shows right breast lower inner quadrant scar with no palpable masses, no palpable adenopathy. -She will continue tamoxifen for a total of 5 years.  We will reevaluate her in 6 months.  We will schedule her for mammogram in March of next year.  2.  Normocytic anemia: - Her hemoglobin was 10.8.  Creatinine was 0.85.  However her ferritin was normal at 145 with percent saturation of 26. -We will monitor it at next visit.      Orders placed this encounter:  Orders Placed This Encounter  Procedures  . MM Digital Diagnostic Bilat  . CBC with Differential/Platelet  . Comprehensive metabolic panel  . Lactate dehydrogenase  . Ferritin  . Iron and TIBC  . Vitamin B12  . Folate      Derek Jack, MD [place] [phone]

## 2018-08-18 NOTE — Assessment & Plan Note (Signed)
1.  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 - Patient has been on tamoxifen since early part of 2016.  She is tolerating it very well.  Denies any vaginal bleeding or spotting.  Her mobility is limited from the sequelae of viral meningitis in August. - We reviewed mammogram dated 12/01/2017 which was BI-RADS Category 1. - Physical examination shows right breast lower inner quadrant scar with no palpable masses, no palpable adenopathy. -She will continue tamoxifen for a total of 5 years.  We will reevaluate her in 6 months.  We will schedule her for mammogram in March of next year.  2.  Normocytic anemia: - Her hemoglobin was 10.8.  Creatinine was 0.85.  However her ferritin was normal at 145 with percent saturation of 26. -We will monitor it at next visit.

## 2018-08-25 DIAGNOSIS — B004 Herpesviral encephalitis: Secondary | ICD-10-CM | POA: Diagnosis not present

## 2018-08-25 DIAGNOSIS — R2689 Other abnormalities of gait and mobility: Secondary | ICD-10-CM | POA: Diagnosis not present

## 2018-08-30 ENCOUNTER — Encounter: Payer: Self-pay | Admitting: Family Medicine

## 2018-08-30 DIAGNOSIS — R5381 Other malaise: Secondary | ICD-10-CM | POA: Diagnosis not present

## 2018-08-30 DIAGNOSIS — B004 Herpesviral encephalitis: Secondary | ICD-10-CM | POA: Diagnosis not present

## 2018-08-30 DIAGNOSIS — Z853 Personal history of malignant neoplasm of breast: Secondary | ICD-10-CM | POA: Diagnosis not present

## 2018-08-30 DIAGNOSIS — M791 Myalgia, unspecified site: Secondary | ICD-10-CM | POA: Diagnosis not present

## 2018-08-30 DIAGNOSIS — E119 Type 2 diabetes mellitus without complications: Secondary | ICD-10-CM | POA: Diagnosis not present

## 2018-08-30 DIAGNOSIS — I10 Essential (primary) hypertension: Secondary | ICD-10-CM | POA: Diagnosis not present

## 2018-09-10 DIAGNOSIS — A879 Viral meningitis, unspecified: Secondary | ICD-10-CM | POA: Diagnosis not present

## 2018-09-13 ENCOUNTER — Encounter: Payer: Medicare HMO | Admitting: Family Medicine

## 2018-09-20 ENCOUNTER — Encounter: Payer: Medicare HMO | Admitting: Family Medicine

## 2018-10-09 DIAGNOSIS — R51 Headache: Secondary | ICD-10-CM | POA: Diagnosis not present

## 2018-10-09 DIAGNOSIS — M26629 Arthralgia of temporomandibular joint, unspecified side: Secondary | ICD-10-CM | POA: Diagnosis not present

## 2018-10-11 ENCOUNTER — Ambulatory Visit (INDEPENDENT_AMBULATORY_CARE_PROVIDER_SITE_OTHER): Payer: Medicare HMO | Admitting: Family Medicine

## 2018-10-11 ENCOUNTER — Ambulatory Visit (HOSPITAL_COMMUNITY)
Admission: RE | Admit: 2018-10-11 | Discharge: 2018-10-11 | Disposition: A | Discharge status: Home / Self Care | Payer: Medicare HMO | Source: Ambulatory Visit | Attending: Family Medicine | Admitting: Family Medicine

## 2018-10-11 ENCOUNTER — Other Ambulatory Visit: Payer: Self-pay | Admitting: Family Medicine

## 2018-10-11 ENCOUNTER — Encounter: Payer: Self-pay | Admitting: Family Medicine

## 2018-10-11 ENCOUNTER — Telehealth: Payer: Self-pay | Admitting: *Deleted

## 2018-10-11 VITALS — BP 136/88 | HR 86 | Resp 14 | Ht 65.0 in | Wt 176.0 lb

## 2018-10-11 DIAGNOSIS — M26649 Arthritis of unspecified temporomandibular joint: Secondary | ICD-10-CM

## 2018-10-11 DIAGNOSIS — K0510 Chronic gingivitis, plaque induced: Secondary | ICD-10-CM

## 2018-10-11 DIAGNOSIS — M2669 Other specified disorders of temporomandibular joint: Secondary | ICD-10-CM | POA: Diagnosis not present

## 2018-10-11 DIAGNOSIS — E1159 Type 2 diabetes mellitus with other circulatory complications: Secondary | ICD-10-CM | POA: Diagnosis not present

## 2018-10-11 DIAGNOSIS — R51 Headache: Secondary | ICD-10-CM | POA: Diagnosis not present

## 2018-10-11 DIAGNOSIS — I10 Essential (primary) hypertension: Secondary | ICD-10-CM

## 2018-10-11 MED ORDER — KETOROLAC TROMETHAMINE 60 MG/2ML IM SOLN
60.0000 mg | Freq: Once | INTRAMUSCULAR | Status: AC
  Administered 2018-10-11: 60 mg via INTRAMUSCULAR

## 2018-10-11 MED ORDER — PREDNISONE 5 MG (21) PO TBPK: 5.0000 mg | ORAL_TABLET | ORAL | 0 refills | Status: DC

## 2018-10-11 MED ORDER — METHYLPREDNISOLONE ACETATE 80 MG/ML IJ SUSP
80.0000 mg | Freq: Once | INTRAMUSCULAR | Status: AC
  Administered 2018-10-11: 80 mg via INTRAMUSCULAR

## 2018-10-11 MED ORDER — FAMOTIDINE 20 MG PO TABS: 20.0000 mg | ORAL_TABLET | Freq: Two times a day (BID) | ORAL | 0 refills | Status: DC

## 2018-10-11 MED ORDER — IBUPROFEN 600 MG PO TABS: ORAL_TABLET | ORAL | 3 refills | Status: AC

## 2018-10-11 MED ORDER — CEPHALEXIN 250 MG PO CAPS: 250.0000 mg | ORAL_CAPSULE | Freq: Two times a day (BID) | ORAL | 0 refills | Status: AC

## 2018-10-11 NOTE — Progress Notes (Signed)
Jillian [name]     MRN: [id number]      DOB: Sep 21, 1941   HPI Jillian [name] is here for follow up and re-evaluation of chronic medical conditions, medication management and review of any available recent lab and radiology data.  Preventive health is updated, specifically  Cancer screening and Immunization.   5  Day h/o left jaw pain, no known trauma no fever or chills, denies sinus pressure or drainage Pain is worse with chewing and direct pressure Has left sided headache also, denies change in vision, numbness or weakness  ROS Denies recent fever or chills. Denies sinus pressure, nasal congestion, ear pain or sore throat. Denies chest congestion, productive cough or wheezing. Denies chest pains, palpitations and leg swelling Denies abdominal pain, nausea, vomiting,diarrhea or constipation.   Denies dysuria, frequency, hesitancy or incontinence. Denies swelling and limitation in mobility. Denies depression, anxiety or insomnia. Denies skin break down or rash.   PE  BP 136/88   Pulse 86   Resp 14   Ht 5\' 5"  (1.651 m)   Wt 176 lb 0.6 oz (79.9 kg)   SpO2 99% Comment: room air  BMI 29.29 kg/m   Patient alert and oriented and in no cardiopulmonary distress.  HEENT: No facial asymmetry, EOMI,   oropharynx pink and moist.  Neck supple no JVD, no mass.Fundoscopy, no hemorrhage and blood vessels appear normal. Gum  left upper is tender and red and swollen Left TM tender to palpation with limitation in ability to open mouth fully due to pain  Chest: Clear to auscultation bilaterally.  CVS: S1, S2 no murmurs, no S3.Regular rate.  ABD: Soft non tender.   Ext: No edema  MS: Adequate ROM spine, shoulders, hips and knees.  Skin: Intact, no ulcerations or rash noted.  Psych: Good eye contact, normal affect. Memory intact not anxious or depressed appearing.  CNS: CN 2-12 intact, power,  normal throughout.no focal deficits noted.   Assessment & Plan  TMJ arthritis Xray of  jaw,  Uncontrolled.Toradol and depo medrol administered IM in the office , to be followed by a short course of oral prednisone and NSAIDS.   Gingivitis Keflex prescribed  ESSENTIAL HYPERTENSION, BENIGN Controlled, no change in medication DASH diet and commitment to daily physical activity for a minimum of 30 minutes discussed and encouraged, as a part of hypertension management. The importance of attaining a healthy weight is also discussed.  BP/Weight 10/27/2018 10/11/2018 08/09/2018 08/02/2018 06/23/2018 06/14/2018 03/18/349  Systolic BP 093 818 299 371 696 789 381  Diastolic BP 80 88 94 60 70 67 83  Wt. (Lbs) 177 176.04 176 175 174 180 -  BMI 29.45 29.29 30.21 30.04 29.87 30.9 -       Type 2 diabetes mellitus with vascular disease (HCC) Controlled, no change in medication Jillian [name] is reminded of the importance of commitment to daily physical activity for 30 minutes or more, as able and the need to limit carbohydrate intake to 30 to 60 grams per meal to help with blood sugar control.   The need to take medication as prescribed, test blood sugar as directed, and to call between visits if there is a concern that blood sugar is uncontrolled is also discussed.   Jillian [name] is reminded of the importance of daily foot exam, annual eye examination, and good blood sugar, blood pressure and cholesterol control. Updated lab needed at/ before next visit.   Diabetic Labs Latest Ref Rng & Units 08/11/2018 08/09/2018 06/23/2018 05/19/2018 05/17/2018  HbA1c 4.8 - 5.6 % - - - - 6.6(H)  Microalbumin Not Estab. ug/mL - - - - -  Micro/Creat Ratio 0.0 - 30.0 mg/g creat - - - - -  Chol <200 mg/dL - - - - -  HDL >50 mg/dL - - - - -  Calc LDL mg/dL (calc) - - - - -  Triglycerides <150 mg/dL - - - - -  Creatinine 0.44 - 1.00 mg/dL 0.85 1.02(H) 1.08(H) 1.17(H) 0.87   BP/Weight 10/27/2018 10/11/2018 08/09/2018 08/02/2018 06/23/2018 06/14/2018 3/66/2947  Systolic BP 654 650 354 656 812 751 700  Diastolic  BP 80 88 94 60 70 67 83  Wt. (Lbs) 177 176.04 176 175 174 180 -  BMI 29.45 29.29 30.21 30.04 29.87 30.9 -   Foot/eye exam completion dates Latest Ref Rng & Units 04/28/2018 09/09/2017  Eye Exam No Retinopathy - -  Foot exam Order - - -  Foot Form Completion - Done Done

## 2018-10-11 NOTE — Telephone Encounter (Signed)
Noted  

## 2018-10-11 NOTE — Telephone Encounter (Signed)
Had a cancellation pt worked in for 220 today

## 2018-10-11 NOTE — Patient Instructions (Addendum)
F/u as before, call if you need me sooner  Pls get X ray of left jaw today  Toradol and depo medrol are adminsitered in office   Three medications are prescribed for left jaw pain, which I believe is due to arthritis, also an antibiotics prescribed , keflex to to swelling and tenderness inside of your mouth.  Please make dental appt as soon as you are able, left upper back tooth may need attention

## 2018-10-11 NOTE — Telephone Encounter (Signed)
Pt was seen in urgent care in [place] for pain on left side of her head and jaw. They gave her a shot for pain and inflammation but it is no better and this morning she can barely eat for the pain on the left side. She wanted to see if she could be worked in for an appt today and if not if Dr Moshe Cipro could call something in for her. Offered her an appt on 1/21 but she said she needed something today.

## 2018-10-27 ENCOUNTER — Ambulatory Visit (INDEPENDENT_AMBULATORY_CARE_PROVIDER_SITE_OTHER): Payer: Medicare HMO | Admitting: Family Medicine

## 2018-10-27 ENCOUNTER — Encounter: Payer: Self-pay | Admitting: Family Medicine

## 2018-10-27 VITALS — BP 124/80 | HR 81 | Resp 15 | Ht 65.0 in | Wt 177.0 lb

## 2018-10-27 DIAGNOSIS — Z0000 Encounter for general adult medical examination without abnormal findings: Secondary | ICD-10-CM

## 2018-10-27 DIAGNOSIS — E785 Hyperlipidemia, unspecified: Secondary | ICD-10-CM

## 2018-10-27 DIAGNOSIS — E1159 Type 2 diabetes mellitus with other circulatory complications: Secondary | ICD-10-CM

## 2018-10-27 DIAGNOSIS — I10 Essential (primary) hypertension: Secondary | ICD-10-CM

## 2018-10-27 NOTE — Patient Instructions (Addendum)
F/U 6 months, call if you need me before    Exam is good  Please get fasting lipid, cmp and EGFr, CBC, HBA1C, TSH and microbial within the next 1 week  No medication changes at this time, please practice good sleep hygiene  Thank you  for choosing [facility]. We consider it a privelige to serve you.  Delivering excellent health care in a caring and  compassionate way is our goal.  Partnering with you,  so that together we can achieve this goal is our strategy.

## 2018-10-27 NOTE — Progress Notes (Signed)
    Jillian [name]     MRN: [id number]      DOB: Nov 19, 1940  HPI: Patient is in for annual physical exam. No other health concerns are expressed or addressed at the visit. Lbas are needed and ordered  For chronic conditions   PE: BP 124/80   Pulse 81   Resp 15   Ht 5\' 5"  (1.651 m)   Wt 177 lb (80.3 kg)   SpO2 98%   BMI 29.45 kg/m   Pleasant  female, alert and oriented x 3, in no cardio-pulmonary distress. Afebrile. HEENT No facial trauma or asymetry. Sinuses non tender.  Extra occullar muscles intact, pupils equally reactive to light. External ears normal, tympanic membranes clear. Oropharynx moist, no exudate. Neck: supple, no adenopathy,JVD or thyromegaly.No bruits.  Chest: Clear to ascultation bilaterally.No crackles or wheezes. Non tender to palpation  Breast: No asymetry,no masses or lumps. No tenderness. No nipple discharge or inversion. No axillary or supraclavicular adenopathy  Cardiovascular system; Heart sounds normal,  S1 and  S2 ,no S3.  No murmur, or thrill. Apical beat not displaced Peripheral pulses normal.  Abdomen: Soft, non tender, no organomegaly or masses. No bruits. Bowel sounds normal. No guarding, tenderness or rebound.    Musculoskeletal exam: Decreased though adequate ROM of spine, hips , shoulders and knees. No deformity ,swelling or crepitus noted. No muscle wasting or atrophy.   Neurologic: Cranial nerves 2 to 12 intact. Power, tone ,sensation and reflexes normal throughout. No disturbance in gait. No tremor.  Skin: Intact, no ulceration, erythema , scaling or rash noted. Pigmentation normal throughout  Psych; Normal mood and affect. Judgement and concentration normal   Assessment & Plan:  Annual physical exam Annual exam as documented. Counseling done  re healthy lifestyle involving commitment to 150 minutes exercise per week, heart healthy diet, and attaining healthy weight.The importance of adequate sleep also  discussed.  Changes in health habits are decided on by the patient with goals and time frames  set for achieving them. Immunization and cancer screening needs are specifically addressed at this visit.

## 2018-10-27 NOTE — Assessment & Plan Note (Addendum)
Annual exam as documented. Counseling done  re healthy lifestyle involving commitment to 150 minutes exercise per week, heart healthy diet, and attaining healthy weight.The importance of adequate sleep also discussed. Changes in health habits are decided on by the patient with goals and time frames  set for achieving them. Immunization and cancer screening needs are specifically addressed at this visit. 

## 2018-11-05 DIAGNOSIS — K0510 Chronic gingivitis, plaque induced: Secondary | ICD-10-CM | POA: Insufficient documentation

## 2018-11-05 NOTE — Assessment & Plan Note (Signed)
Xray of jaw,  Uncontrolled.Toradol and depo medrol administered IM in the office , to be followed by a short course of oral prednisone and NSAIDS.

## 2018-11-05 NOTE — Assessment & Plan Note (Signed)
Controlled, no change in medication Jillian [name] is reminded of the importance of commitment to daily physical activity for 30 minutes or more, as able and the need to limit carbohydrate intake to 30 to 60 grams per meal to help with blood sugar control.   The need to take medication as prescribed, test blood sugar as directed, and to call between visits if there is a concern that blood sugar is uncontrolled is also discussed.   Jillian [name] is reminded of the importance of daily foot exam, annual eye examination, and good blood sugar, blood pressure and cholesterol control. Updated lab needed at/ before next visit.   Diabetic Labs Latest Ref Rng & Units 08/11/2018 08/09/2018 06/23/2018 05/19/2018 05/17/2018  HbA1c 4.8 - 5.6 % - - - - 6.6(H)  Microalbumin Not Estab. ug/mL - - - - -  Micro/Creat Ratio 0.0 - 30.0 mg/g creat - - - - -  Chol <200 mg/dL - - - - -  HDL >50 mg/dL - - - - -  Calc LDL mg/dL (calc) - - - - -  Triglycerides <150 mg/dL - - - - -  Creatinine 0.44 - 1.00 mg/dL 0.85 1.02(H) 1.08(H) 1.17(H) 0.87   BP/Weight 10/27/2018 10/11/2018 08/09/2018 08/02/2018 06/23/2018 06/14/2018 0/21/1173  Systolic BP 567 014 103 013 143 888 757  Diastolic BP 80 88 94 60 70 67 83  Wt. (Lbs) 177 176.04 176 175 174 180 -  BMI 29.45 29.29 30.21 30.04 29.87 30.9 -   Foot/eye exam completion dates Latest Ref Rng & Units 04/28/2018 09/09/2017  Eye Exam No Retinopathy - -  Foot exam Order - - -  Foot Form Completion - Done Done

## 2018-11-05 NOTE — Assessment & Plan Note (Signed)
Keflex prescribed.

## 2018-11-05 NOTE — Assessment & Plan Note (Signed)
Controlled, no change in medication DASH diet and commitment to daily physical activity for a minimum of 30 minutes discussed and encouraged, as a part of hypertension management. The importance of attaining a healthy weight is also discussed.  BP/Weight 10/27/2018 10/11/2018 08/09/2018 08/02/2018 06/23/2018 06/14/2018 0/35/2481  Systolic BP 859 093 112 162 446 950 722  Diastolic BP 80 88 94 60 70 67 83  Wt. (Lbs) 177 176.04 176 175 174 180 -  BMI 29.45 29.29 30.21 30.04 29.87 30.9 -

## 2018-11-08 ENCOUNTER — Other Ambulatory Visit: Payer: Self-pay | Admitting: Family Medicine

## 2018-11-08 ENCOUNTER — Encounter: Payer: Self-pay | Admitting: Family Medicine

## 2018-11-08 ENCOUNTER — Ambulatory Visit (INDEPENDENT_AMBULATORY_CARE_PROVIDER_SITE_OTHER): Payer: Medicare HMO | Admitting: Family Medicine

## 2018-11-08 VITALS — BP 130/80 | HR 88 | Resp 15 | Ht 65.0 in | Wt 176.0 lb

## 2018-11-08 DIAGNOSIS — M549 Dorsalgia, unspecified: Secondary | ICD-10-CM

## 2018-11-08 DIAGNOSIS — E1159 Type 2 diabetes mellitus with other circulatory complications: Secondary | ICD-10-CM

## 2018-11-08 DIAGNOSIS — I10 Essential (primary) hypertension: Secondary | ICD-10-CM

## 2018-11-08 DIAGNOSIS — M546 Pain in thoracic spine: Secondary | ICD-10-CM | POA: Diagnosis not present

## 2018-11-08 DIAGNOSIS — E1121 Type 2 diabetes mellitus with diabetic nephropathy: Secondary | ICD-10-CM | POA: Diagnosis not present

## 2018-11-08 DIAGNOSIS — E785 Hyperlipidemia, unspecified: Secondary | ICD-10-CM | POA: Diagnosis not present

## 2018-11-08 MED ORDER — CYCLOBENZAPRINE HCL 5 MG PO TABS: 5.0000 mg | ORAL_TABLET | Freq: Every day | ORAL | 0 refills | Status: AC

## 2018-11-08 MED ORDER — IBUPROFEN 600 MG PO TABS: ORAL_TABLET | ORAL | 0 refills | Status: DC

## 2018-11-08 MED ORDER — KETOROLAC TROMETHAMINE 60 MG/2ML IM SOLN
60.0000 mg | Freq: Once | INTRAMUSCULAR | Status: AC
  Administered 2018-11-08: 60 mg via INTRAMUSCULAR

## 2018-11-08 MED ORDER — METHYLPREDNISOLONE ACETATE 80 MG/ML IJ SUSP
80.0000 mg | Freq: Once | INTRAMUSCULAR | Status: AC
  Administered 2018-11-08: 80 mg via INTRAMUSCULAR

## 2018-11-08 MED ORDER — PREDNISONE 10 MG PO TABS: 10.0000 mg | ORAL_TABLET | Freq: Two times a day (BID) | ORAL | 0 refills | Status: AC

## 2018-11-08 MED ORDER — FAMOTIDINE 40 MG PO TABS: 40.0000 mg | ORAL_TABLET | Freq: Every day | ORAL | 0 refills | Status: DC

## 2018-11-08 NOTE — Assessment & Plan Note (Signed)
Controlled, no change in medication DASH diet and commitment to daily physical activity for a minimum of 30 minutes discussed and encouraged, as a part of hypertension management. The importance of attaining a healthy weight is also discussed.  BP/Weight 11/08/2018 10/27/2018 10/11/2018 08/09/2018 08/02/2018 06/23/2018 3/83/3383  Systolic BP 291 916 606 004 599 774 142  Diastolic BP 80 80 88 94 60 70 67  Wt. (Lbs) 176 177 176.04 176 175 174 180  BMI 29.29 29.45 29.29 30.21 30.04 29.87 30.9

## 2018-11-08 NOTE — Patient Instructions (Signed)
F/U as before, call if you need me sooner.  Toradol 60 mg IM and depo Medrol 80 mg iM in office today  Four medications are prescribed for short term use to your local pharmacy   Hope you feel better soon  Thank you  for choosing [facility]. We consider it a privelige to serve you.  Delivering excellent health care in a caring and  compassionate way is our goal.  Partnering with you,  so that together we can achieve this goal is our strategy.

## 2018-11-08 NOTE — Progress Notes (Signed)
Jillian [name]     MRN: [id number]      DOB: Mar 09, 1941   HPI Jillian [name] is here with a 3 day h/o upper back pain radiating to left shoulder, moving  Shoulder hurts and neck muscle on the left side is tight and in spasm, no inciting trauma, no upper extremity weakness or loss of sensation Denies polyuria, polydipsia, blurred vision , or hypoglycemic episodes.  ROS Denies recent fever or chills. Denies sinus pressure, nasal congestion, ear pain or sore throat. Denies chest congestion, productive cough or wheezing. Denies chest pains, palpitations and leg swelling Denies abdominal pain, nausea, vomiting,diarrhea or constipation.   Denies dysuria, frequency, hesitancy or incontinence. Denies headaches, seizures,. Denies depression, anxiety or insomnia. Denies skin break down or rash.   PE BP 130/80   Pulse 88   Resp 15   Ht 5\' 5"  (1.651 m)   Wt 176 lb (79.8 kg)   SpO2 97%   BMI 29.29 kg/m    Patient alert and oriented and in no cardiopulmonary distress.  HEENT: No facial asymmetry, EOMI,   oropharynx pink and moist.  Neck left trapezius spasm no JVD, no mass.  Chest: Clear to auscultation bilaterally.  CVS: S1, S2 no murmurs, no S3.Regular rate.  ABD: Soft non tender.   Ext: No edema  MS: decreased ROM mid thoracic spine,  Reduced in left shoulder, normal in hips and knees.  Skin: Intact, no ulcerations or rash noted.  Psych: Good eye contact, normal affect. Memory intact not anxious or depressed appearing.  CNS: CN 2-12 intact, power,  normal throughout.no focal deficits noted.   Assessment & Plan  Thoracic spine pain Uncontrolled.Toradol and depo medrol administered IM in the office , to be followed by a short course of oral prednisone and NSAIDS. Muscle relaxant prescribed for short term use also  ESSENTIAL HYPERTENSION, BENIGN Controlled, no change in medication DASH diet and commitment to daily physical activity for a minimum of 30 minutes discussed  and encouraged, as a part of hypertension management. The importance of attaining a healthy weight is also discussed.  BP/Weight 11/08/2018 10/27/2018 10/11/2018 08/09/2018 08/02/2018 06/23/2018 6/38/7564  Systolic BP 332 951 884 166 063 016 010  Diastolic BP 80 80 88 94 60 70 67  Wt. (Lbs) 176 177 176.04 176 175 174 180  BMI 29.29 29.45 29.29 30.21 30.04 29.87 30.9       Type 2 diabetes mellitus with vascular disease ([place]) Jillian [name] is reminded of the importance of commitment to daily physical activity for 30 minutes or more, as able and the need to limit carbohydrate intake to 30 to 60 grams per meal to help with blood sugar control.   The need to take medication as prescribed, test blood sugar as directed, and to call between visits if there is a concern that blood sugar is uncontrolled is also discussed.   Jillian [name] is reminded of the importance of daily foot exam, annual eye examination, and good blood sugar, blood pressure and cholesterol control. Updated lab needed.   Diabetic Labs Latest Ref Rng & Units 08/11/2018 08/09/2018 06/23/2018 05/19/2018 05/17/2018  HbA1c 4.8 - 5.6 % - - - - 6.6(H)  Microalbumin Not Estab. ug/mL - - - - -  Micro/Creat Ratio 0.0 - 30.0 mg/g creat - - - - -  Chol <200 mg/dL - - - - -  HDL >50 mg/dL - - - - -  Calc LDL mg/dL (calc) - - - - -  Triglycerides <150  mg/dL - - - - -  Creatinine 0.44 - 1.00 mg/dL 0.85 1.02(H) 1.08(H) 1.17(H) 0.87   BP/Weight 11/08/2018 10/27/2018 10/11/2018 08/09/2018 08/02/2018 06/23/2018 2/29/7989  Systolic BP 211 941 740 814 481 856 314  Diastolic BP 80 80 88 94 60 70 67  Wt. (Lbs) 176 177 176.04 176 175 174 180  BMI 29.29 29.45 29.29 30.21 30.04 29.87 30.9   Foot/eye exam completion dates Latest Ref Rng & Units 04/28/2018 09/09/2017  Eye Exam No Retinopathy - -  Foot exam Order - - -  Foot Form Completion - Done Done

## 2018-11-08 NOTE — Assessment & Plan Note (Signed)
Jillian [name] is reminded of the importance of commitment to daily physical activity for 30 minutes or more, as able and the need to limit carbohydrate intake to 30 to 60 grams per meal to help with blood sugar control.   The need to take medication as prescribed, test blood sugar as directed, and to call between visits if there is a concern that blood sugar is uncontrolled is also discussed.   Jillian [name] is reminded of the importance of daily foot exam, annual eye examination, and good blood sugar, blood pressure and cholesterol control. Updated lab needed.   Diabetic Labs Latest Ref Rng & Units 08/11/2018 08/09/2018 06/23/2018 05/19/2018 05/17/2018  HbA1c 4.8 - 5.6 % - - - - 6.6(H)  Microalbumin Not Estab. ug/mL - - - - -  Micro/Creat Ratio 0.0 - 30.0 mg/g creat - - - - -  Chol <200 mg/dL - - - - -  HDL >50 mg/dL - - - - -  Calc LDL mg/dL (calc) - - - - -  Triglycerides <150 mg/dL - - - - -  Creatinine 0.44 - 1.00 mg/dL 0.85 1.02(H) 1.08(H) 1.17(H) 0.87   BP/Weight 11/08/2018 10/27/2018 10/11/2018 08/09/2018 08/02/2018 06/23/2018 01/07/4080  Systolic BP 448 185 631 497 026 378 588  Diastolic BP 80 80 88 94 60 70 67  Wt. (Lbs) 176 177 176.04 176 175 174 180  BMI 29.29 29.45 29.29 30.21 30.04 29.87 30.9   Foot/eye exam completion dates Latest Ref Rng & Units 04/28/2018 09/09/2017  Eye Exam No Retinopathy - -  Foot exam Order - - -  Foot Form Completion - Done Done

## 2018-11-08 NOTE — Assessment & Plan Note (Signed)
Uncontrolled.Toradol and depo medrol administered IM in the office , to be followed by a short course of oral prednisone and NSAIDS. Muscle relaxant prescribed for short term use also

## 2018-11-09 ENCOUNTER — Telehealth: Payer: Self-pay | Admitting: Family Medicine

## 2018-11-09 LAB — CBC
HCT: 36.7 % (ref 35.0–45.0)
Hemoglobin: 11.9 g/dL (ref 11.7–15.5)
MCH: 29.5 pg (ref 27.0–33.0)
MCHC: 32.4 g/dL (ref 32.0–36.0)
MCV: 90.8 fL (ref 80.0–100.0)
MPV: 12.4 fL (ref 7.5–12.5)
PLATELETS: 159 10*3/uL (ref 140–400)
RBC: 4.04 10*6/uL (ref 3.80–5.10)
RDW: 13.1 % (ref 11.0–15.0)
WBC: 6.6 10*3/uL (ref 3.8–10.8)

## 2018-11-09 LAB — COMPLETE METABOLIC PANEL WITH GFR
AG RATIO: 1.3 (calc) (ref 1.0–2.5)
ALKALINE PHOSPHATASE (APISO): 51 U/L (ref 37–153)
ALT: 7 U/L (ref 6–29)
AST: 12 U/L (ref 10–35)
Albumin: 3.9 g/dL (ref 3.6–5.1)
BUN/Creatinine Ratio: 15 (calc) (ref 6–22)
BUN: 15 mg/dL (ref 7–25)
CHLORIDE: 109 mmol/L (ref 98–110)
CO2: 27 mmol/L (ref 20–32)
Calcium: 9.3 mg/dL (ref 8.6–10.4)
Creat: 0.97 mg/dL — ABNORMAL HIGH (ref 0.60–0.93)
GFR, Est African American: 66 mL/min/{1.73_m2} (ref >=60)
GFR, Est Non African American: 57 mL/min/{1.73_m2} — ABNORMAL LOW (ref >=60)
GLUCOSE: 102 mg/dL — AB (ref 65–99)
Globulin: 2.9 g/dL (calc) (ref 1.9–3.7)
POTASSIUM: 4.3 mmol/L (ref 3.5–5.3)
Sodium: 143 mmol/L (ref 135–146)
Total Bilirubin: 0.5 mg/dL (ref 0.2–1.2)
Total Protein: 6.8 g/dL (ref 6.1–8.1)

## 2018-11-09 LAB — MICROALBUMIN / CREATININE URINE RATIO
Creatinine, Urine: 352 mg/dL — ABNORMAL HIGH (ref 20–275)
MICROALB/CREAT RATIO: 6 ug/mg{creat} (ref <30)
Microalb, Ur: 2.1 mg/dL

## 2018-11-09 LAB — HEMOGLOBIN A1C
Hgb A1c MFr Bld: 6.7 % of total Hgb — ABNORMAL HIGH (ref <5.7)
Mean Plasma Glucose: 146 (calc)
eAG (mmol/L): 8.1 (calc)

## 2018-11-09 LAB — LIPID PANEL
CHOL/HDL RATIO: 2.7 (calc) (ref <5.0)
Cholesterol: 138 mg/dL (ref <200)
HDL: 52 mg/dL (ref >=50)
LDL CHOLESTEROL (CALC): 67 mg/dL
NON-HDL CHOLESTEROL (CALC): 86 mg/dL (ref <130)
Triglycerides: 111 mg/dL (ref <150)

## 2018-11-09 LAB — TSH: TSH: 1.08 m[IU]/L (ref 0.40–4.50)

## 2018-11-09 NOTE — Telephone Encounter (Signed)
Jillian [name] requesting a Nurse call back regarding his mother.

## 2018-11-09 NOTE — Telephone Encounter (Signed)
Called patient's son back, he stated that there was an issue with the pharmacy and the medication that was prescribed for his Mother, but that he had spoken with the pharmacy and was able to get the prescriptions filled. So everything was fine.

## 2018-11-25 ENCOUNTER — Other Ambulatory Visit (HOSPITAL_COMMUNITY): Payer: Self-pay | Admitting: Nurse Practitioner

## 2018-11-25 DIAGNOSIS — D241 Benign neoplasm of right breast: Secondary | ICD-10-CM

## 2018-12-01 DIAGNOSIS — H524 Presbyopia: Secondary | ICD-10-CM | POA: Diagnosis not present

## 2018-12-07 ENCOUNTER — Ambulatory Visit (HOSPITAL_COMMUNITY): Payer: Medicare HMO

## 2018-12-07 ENCOUNTER — Encounter (HOSPITAL_COMMUNITY): Payer: Medicare HMO

## 2018-12-15 ENCOUNTER — Other Ambulatory Visit (HOSPITAL_COMMUNITY): Payer: Self-pay | Admitting: Nurse Practitioner

## 2018-12-15 DIAGNOSIS — Z1231 Encounter for screening mammogram for malignant neoplasm of breast: Secondary | ICD-10-CM

## 2018-12-31 ENCOUNTER — Emergency Department (HOSPITAL_COMMUNITY)
Admission: EM | Admit: 2018-12-31 | Discharge: 2018-12-31 | Disposition: A | Discharge status: Home / Self Care | Payer: Medicare HMO | Attending: Emergency Medicine | Admitting: Emergency Medicine

## 2018-12-31 ENCOUNTER — Other Ambulatory Visit: Payer: Self-pay

## 2018-12-31 ENCOUNTER — Emergency Department (HOSPITAL_COMMUNITY): Payer: Medicare HMO

## 2018-12-31 ENCOUNTER — Other Ambulatory Visit: Payer: Self-pay | Admitting: Family Medicine

## 2018-12-31 ENCOUNTER — Encounter (HOSPITAL_COMMUNITY): Payer: Self-pay

## 2018-12-31 DIAGNOSIS — R079 Chest pain, unspecified: Secondary | ICD-10-CM

## 2018-12-31 DIAGNOSIS — Z79899 Other long term (current) drug therapy: Secondary | ICD-10-CM | POA: Diagnosis not present

## 2018-12-31 DIAGNOSIS — E119 Type 2 diabetes mellitus without complications: Secondary | ICD-10-CM | POA: Insufficient documentation

## 2018-12-31 DIAGNOSIS — Z7982 Long term (current) use of aspirin: Secondary | ICD-10-CM | POA: Diagnosis not present

## 2018-12-31 DIAGNOSIS — I10 Essential (primary) hypertension: Secondary | ICD-10-CM | POA: Insufficient documentation

## 2018-12-31 DIAGNOSIS — R0789 Other chest pain: Secondary | ICD-10-CM | POA: Diagnosis not present

## 2018-12-31 LAB — COMPREHENSIVE METABOLIC PANEL
ALT: 12 U/L (ref 0–44)
AST: 15 U/L (ref 15–41)
Albumin: 3.7 g/dL (ref 3.5–5.0)
Alkaline Phosphatase: 52 U/L (ref 38–126)
Anion gap: 8 (ref 5–15)
BUN: 14 mg/dL (ref 8–23)
CO2: 25 mmol/L (ref 22–32)
Calcium: 9.3 mg/dL (ref 8.9–10.3)
Chloride: 108 mmol/L (ref 98–111)
Creatinine, Ser: 0.84 mg/dL (ref 0.44–1.00)
GFR calc Af Amer: >60 mL/min (ref >60)
GFR calc non Af Amer: >60 mL/min (ref >60)
Glucose, Bld: 85 mg/dL (ref 70–99)
Potassium: 3.7 mmol/L (ref 3.5–5.1)
Sodium: 141 mmol/L (ref 135–145)
Total Bilirubin: 0.5 mg/dL (ref 0.3–1.2)
Total Protein: 6.9 g/dL (ref 6.5–8.1)

## 2018-12-31 LAB — CBC WITH DIFFERENTIAL/PLATELET
Abs Immature Granulocytes: 0.04 10*3/uL (ref 0.00–0.07)
Basophils Absolute: 0 10*3/uL (ref 0.0–0.1)
Basophils Relative: 0 %
Eosinophils Absolute: 0 10*3/uL (ref 0.0–0.5)
Eosinophils Relative: 0 %
HCT: 37.7 % (ref 36.0–46.0)
Hemoglobin: 11.8 g/dL — ABNORMAL LOW (ref 12.0–15.0)
Immature Granulocytes: 1 %
Lymphocytes Relative: 41 %
Lymphs Abs: 3.6 10*3/uL (ref 0.7–4.0)
MCH: 30.2 pg (ref 26.0–34.0)
MCHC: 31.3 g/dL (ref 30.0–36.0)
MCV: 96.4 fL (ref 80.0–100.0)
Monocytes Absolute: 0.7 10*3/uL (ref 0.1–1.0)
Monocytes Relative: 8 %
Neutro Abs: 4.4 10*3/uL (ref 1.7–7.7)
Neutrophils Relative %: 50 %
Platelets: 180 10*3/uL (ref 150–400)
RBC: 3.91 MIL/uL (ref 3.87–5.11)
RDW: 14.2 % (ref 11.5–15.5)
WBC: 8.8 10*3/uL (ref 4.0–10.5)
nRBC: 0 % (ref 0.0–0.2)

## 2018-12-31 LAB — TROPONIN I: Troponin I: <0.03 ng/mL (ref <0.03)

## 2018-12-31 MED ORDER — SODIUM CHLORIDE 0.9% FLUSH: 3.0000 mL | Freq: Once | INTRAVENOUS | Status: DC

## 2018-12-31 NOTE — ED Provider Notes (Signed)
[facility] EMERGENCY DEPARTMENT Provider Note   CSN: [id number] Arrival date & time: 12/31/18  1353    History   Chief Complaint Chief Complaint  Patient presents with  . Chest Pain    HPI Jillian [name] is a 78 y.o. female.     Right-sided chest pain for 3 days worse with position and palpation.  No crushing substernal chest pain, diaphoresis, dyspnea, nausea.  No previous history of CAD.  No prolonged travel or immobilization.  Past medical history includes hypertension, type 2 diabetes, hyperlipidemia.  Severity of symptoms is mild to moderate.     Past Medical History:  Diagnosis Date  . Chronic fatigue   . Diabetes mellitus 2011  . DJD (degenerative joint disease)    of neck    . Dyslipidemia   . 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 Hypertension 2005  . Mild obesity   . Postmenopausal   . Viral meningitis   . Wears glasses     Patient Active Problem List   Diagnosis Date Noted  . Back pain 11/08/2018  . Gingivitis 11/05/2018  . 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 Back pain with right-sided radiculopathy 04/28/2018  . Longitudinal split nail 02/15/2018  . Chronic left SI joint pain 05/21/2016  . Primary osteoarthritis of both knees 06/26/2015  . 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 Thoracic spine pain 09/28/2014  . Hip pain 08/06/2012  . 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 Hot flashes, menopausal 09/10/2011  . Type 2 diabetes mellitus with vascular disease ([place]) 01/08/2010  . INSOMNIA, CHRONIC 11/28/2009  . Hyperlipidemia with target LDL less than 100 10/12/2007  . ESSENTIAL HYPERTENSION, BENIGN 10/12/2007    Past Surgical History:  Procedure Laterality Date  . ABDOMINAL HYSTERECTOMY  1992   fibroids  . APPENDECTOMY    . BREAST EXCISIONAL BIOPSY Right    benign  . BREAST LUMPECTOMY WITH RADIOACTIVE SEED LOCALIZATION Right 10/05/2014   Procedure: BREAST  LUMPECTOMY WITH RADIOACTIVE SEED LOCALIZATION;  Surgeon: Erroll Luna, MD;  Location: [place];  Service: General;  Laterality: Right;  . cataract Bilateral   . CYST EXCISION Right    arm  . EYE SURGERY Right 06/07/2013   cataract  . EYE SURGERY Left 09/14/2013   cataract  . KNEE ARTHROSCOPY Right 02/26/2016   Procedure: ARTHROSCOPY RIGHT KNEE WITH MENSICAL DEBRIDEMENT;  Surgeon: Gaynelle Arabian, MD;  Location: [facility];  Service: Orthopedics;  Laterality: Right;  . left breast biopsy    . LESION REMOVAL Right 03/30/2013   Procedure: EXCISION OF NEOPLASM ARM;  Surgeon: Jamesetta So, MD;  Location: [facility];  Service: General;  Laterality: Right;  . TUBAL LIGATION       OB History    Gravida  2   Para  2   Term  2   Preterm      AB      Living        SAB      TAB      Ectopic      Multiple      Live Births               Home Medications    Prior to Admission medications   Medication Sig Start Date End Date Taking? Authorizing Provider  acetaminophen (TYLENOL) 500 MG tablet Take 1,000 mg by mouth every 6 (six) hours as needed for  mild pain.    [provider]  aspirin EC 81 MG tablet Take 1 tablet (81 mg total) by mouth every evening. Patient taking differently: Take 81 mg by mouth every morning.  04/23/16   Fayrene Helper, MD  cloNIDine (CATAPRES) 0.1 MG tablet Take one tablet once daily at bedtime Patient taking differently: daily. Take one tablet once daily at bedtime 06/23/18   Fayrene Helper, MD  famotidine (PEPCID) 40 MG tablet Take 1 tablet (40 mg total) by mouth daily for 14 days. 11/08/18 11/22/18  Fayrene Helper, MD  FORTAMET 500 MG 24 hr tablet TAKE 1 TABLET  DAILY WITH BREAKFAST. 07/02/18   Fayrene Helper, MD  ibuprofen (ADVIL,MOTRIN) 600 MG tablet Take one tablet two times daily for 5 days 11/08/18   Fayrene Helper, MD  tamoxifen (NOLVADEX) 20 MG tablet Take 1 tablet (20 mg total) by mouth daily. 04/30/18    Higgs, Mathis Dad, MD    Family History Family History  Problem Relation Age of Onset  . Hypertension Father   . Congestive Heart Failure Father   . Liver cancer Mother   . Hypertension Sister   . Hypertension Brother   . Diabetes Brother     Social History Social History   Tobacco Use  . Smoking status: Never Smoker  . Smokeless tobacco: Never Used  Substance Use Topics  . Alcohol use: No  . Drug use: No     Allergies   Codeine and Propoxyphene n-acetaminophen   Review of Systems Review of Systems  All other systems reviewed and are negative.    Physical Exam Updated Vital Signs BP (!) 153/78   Pulse 76   Temp 98 F (36.7 C)   Resp (!) 23   Ht 5\' 5"  (1.651 m)   Wt 79.8 kg   SpO2 96%   BMI 29.29 kg/m   Physical Exam Vitals signs and nursing note reviewed.  Constitutional:      Appearance: She is well-developed.  HENT:     Head: Normocephalic and atraumatic.  Eyes:     Conjunctiva/sclera: Conjunctivae normal.  Neck:     Musculoskeletal: Neck supple.  Cardiovascular:     Rate and Rhythm: Normal rate and regular rhythm.  Pulmonary:     Effort: Pulmonary effort is normal.     Breath sounds: Normal breath sounds.  Abdominal:     General: Bowel sounds are normal.     Palpations: Abdomen is soft.  Musculoskeletal: Normal range of motion.  Skin:    General: Skin is warm and dry.  Neurological:     Mental Status: She is alert and oriented to person, place, and time.  Psychiatric:        Behavior: Behavior normal.      ED Treatments / Results  Labs (all labs ordered are listed, but only abnormal results are displayed) Labs Reviewed  CBC WITH DIFFERENTIAL/PLATELET - Abnormal; Notable for the following components:      Result Value   Hemoglobin 11.8 (*)    All other components within normal limits  COMPREHENSIVE METABOLIC PANEL  TROPONIN I    EKG EKG Interpretation  Date/Time:  Friday December 31 2018 14:08:02 EDT Ventricular Rate:  83 PR  Interval:    QRS Duration: 83 QT Interval:  367 QTC Calculation: 432 R Axis:   4 Text Interpretation:  Sinus rhythm Low voltage, precordial leads Abnormal R-wave progression, early transition Baseline wander in lead(s) II III aVR aVL aVF V1 Confirmed by Lacinda Axon,  Aaron Edelman [id number]) on 12/31/2018 4:36:13 PM   Radiology Dg Chest 2 View  Result Date: 12/31/2018 CLINICAL DATA:  Chest pain EXAM: CHEST - 2 VIEW COMPARISON:  08/02/2018 FINDINGS: The heart size and mediastinal contours are within normal limits. Both lungs are clear. The visualized skeletal structures are unremarkable. IMPRESSION: No active cardiopulmonary disease. Electronically Signed   By: Kathreen Devoid   On: 12/31/2018 14:59    Procedures Procedures (including critical care time)  Medications Ordered in ED Medications - No data to display   Initial Impression / Assessment and Plan / ED Course  I have reviewed the triage vital signs and the nursing notes.  Pertinent labs & imaging results that were available during my care of the patient were reviewed by me and considered in my medical decision making (see chart for details).        Patient presents with chest pain.  History is not suggestive of ACS.  Screening labs, troponin, EKG, chest x-ray all negative.  Suspect musculoskeletal etiology.  Final Clinical Impressions(s) / ED Diagnoses   Final diagnoses:  Chest pain, unspecified type    ED Discharge Orders    None       Nat Christen, MD 12/31/18 [phone]

## 2018-12-31 NOTE — Discharge Instructions (Addendum)
Tests were all good.  No evidence of a heart attack.  Tylenol or ibuprofen for pain.  Follow-up with your primary care doctor.

## 2018-12-31 NOTE — ED Triage Notes (Signed)
Pt reports right sided chest pain that began 3 days ago. Pain has been constant  No known injury

## 2019-01-04 ENCOUNTER — Other Ambulatory Visit: Payer: Self-pay

## 2019-01-04 ENCOUNTER — Telehealth: Payer: Self-pay | Admitting: *Deleted

## 2019-01-04 MED ORDER — METFORMIN HCL ER 500 MG PO TB24: 500.0000 mg | ORAL_TABLET | Freq: Two times a day (BID) | ORAL | 0 refills | Status: DC

## 2019-01-04 NOTE — Telephone Encounter (Signed)
Temp supply sent

## 2019-01-04 NOTE — Telephone Encounter (Signed)
Pt son called she needed a refill on metformin sent to [facility] . Saw this was sent in this morning let him know. He stated that she was completely out and wanted to know if a temporary supply could be sent to a local pharmacy to hold her until [place] delivered hers

## 2019-01-06 ENCOUNTER — Ambulatory Visit (INDEPENDENT_AMBULATORY_CARE_PROVIDER_SITE_OTHER): Payer: Medicare HMO | Admitting: Family Medicine

## 2019-01-06 ENCOUNTER — Other Ambulatory Visit: Payer: Self-pay

## 2019-01-06 ENCOUNTER — Encounter: Payer: Self-pay | Admitting: Family Medicine

## 2019-01-06 DIAGNOSIS — R110 Nausea: Secondary | ICD-10-CM | POA: Diagnosis not present

## 2019-01-06 DIAGNOSIS — R079 Chest pain, unspecified: Secondary | ICD-10-CM | POA: Diagnosis not present

## 2019-01-06 MED ORDER — ONDANSETRON HCL 4 MG PO TABS: 4.0000 mg | ORAL_TABLET | Freq: Three times a day (TID) | ORAL | 0 refills | Status: DC | PRN

## 2019-01-06 MED ORDER — TRAMADOL HCL 50 MG PO TABS: 50.0000 mg | ORAL_TABLET | Freq: Two times a day (BID) | ORAL | 0 refills | Status: DC | PRN

## 2019-01-06 NOTE — Patient Instructions (Addendum)
    Thank you for allowing [place] to complete your office visit today via telephone. I appreciate the opportunity to provide you with the care for your health and wellness. Today we discussed: Recent visit to the emergency room, and chest pain.  I have sent in a immediate release medication called Ultram, please take this and see if it helps with your discomfort in your chest.  I have also sent in some Zofran if you become nauseated or vomit with this medication.  Please you will find some relief with taking it.  If you have any reactions or any issues please do not hesitate to call us.  Ms. Oshields since you recently had an acute visit to the emergency room secondary to having some chest pain that is predominantly on the right side I think it is best that we follow-up with your oncologist sooner than later.  Your new oncology appointment is set for April 22 at 10:10 AM.  If you have any questions or concerns please call our office and/or your oncologist office.  I think it would be best for you to be followed up with them secondary to your chest pain not being resolved and unsure of its cause.  Look forward to seeing you in the office in the coming months after this pandemic.  Until then please stay home, please stay safe, please let us know if you need anything in the interim.  [place] YOUR HANDS WELL AND FREQUENTLY. AVOID TOUCHING YOUR FACE, UNLESS YOUR HANDS ARE FRESHLY WASHED.  GET FRESH AIR DAILY. STAY HYDRATED WITH WATER.   It was a pleasure to see you and I look forward to continuing to work together on your health and well-being.Please do not hesitate to call the office if you need care or have questions about your care.  Have a wonderful day and week. With Gratitude, Cherly Beach, DNP, AGNP-BC

## 2019-01-06 NOTE — Progress Notes (Signed)
Virtual Visit via Telephone Note  I connected with Jillian [name] on 01/06/19 at  8:20 AM EDT by telephone and verified that I am speaking with the correct person using two identifiers.   I discussed the limitations, risks, security and privacy concerns of performing an evaluation and management service by telephone and the availability of in person appointments. I also discussed with the patient that there may be a patient responsible charge related to this service. The patient expressed understanding and agreed to proceed.  Location of Patient: Home Location of Provider: Telehealth Consent was obtain for visit to be over via telehealth.   History of Present Illness: Jillian [name] is a 78 year old female patient of Dr. Griffin Dakin.  She is well-known to the clinic.  She presents today for a telemedicine visit via telephone secondary to a follow-up from her visiting the emergency room.  She presented to the emergency room back on April 10 of 2020.  She had that time had right-sided chest pain for 3 days that worsen with position and palpitation.  There were no signs or symptoms of cardiac involvement.  Her severity of her symptoms were reported as mild to moderate.  Physical exam was negative for any findings.  History was not suggestive of ACS.  Her screening labs, troponin, EKG, chest x-ray were all negative for any findings.  She was discharged from the emergency room with inspection of musculoskeletal etiology being the culprit of her chest discomfort.  Today in conversation she reports that she still has chest pain.  She describes that the chest pain starts around the collarbone and goes to the top of the breast, but not actually into her breast.  This is predominantly on the right side.  Onset seems to been around April 7, about 10 days ago.  At that time she denies any injury, trauma to the area.  The only thing she remembers lifting or picking up that day prior to the pain was a gallon of milk.   But this is never caused her problems or issues in the past.  She denies any radiation into the neck, into the shoulder, or back.  She denies having weakness of the right arm, decreased range of motion.  But does report that she has increased discomfort when she is moving the right side.  Denies having any coughing that could create rib discomfort.  Denies having any trouble sleeping.  Though she does have problems if she is laying on her right side.   This morning she describes her pain as a toothache in her chest.  Currently an 8 out of 10 in pain.  But if she is not moving it goes down to about a 4.  Reports that Tylenol and NSAIDs nor heating pad have helped relieve any of her discomfort or pain.  Additionally reports that she normally has trouble with her back, her back is actually not bothering her right now.  She denies having any fevers, chills, cough, shortness of breath, chest pain outside of what was previously described above, chest tightness, palpitations, leg swelling, wheezing or any other signs or symptoms of possible infection.  It should be mentioned that Jillian [name] is on tamoxifen secondary to having breast cancer, right sided lumpectomy in January 2016.  He is being followed by the [place].  Her last mammogram was clear the rad score of 1.  Was in March 2019.  She was due to have her updated mammogram in March of  this year before the virus, pandemic started.  That has postponed her current mammogram.  Her previous office note from November 27 of 2019 reported a review of systems of her having positive chest pain. Next follow-up appointment is not until the end of May with the cancer center.    Past Medical, Surgical, Social History, Allergies, and Medications have been Reviewed.   Review of Systems  Constitutional: Negative for chills, fatigue and fever.  HENT: Negative.   Eyes: Negative.   Respiratory: Negative.   Cardiovascular:  Positive for chest pain.       See HPI  Gastrointestinal: Negative.   Endocrine: Negative.   Genitourinary: Negative.   Musculoskeletal:       See HPI  Skin: Negative.   Allergic/Immunologic: Negative.   Neurological: Negative.   Hematological: Negative.   Psychiatric/Behavioral: Negative.   All other systems reviewed and are negative.  Observations/Objective: Physical Exam Neurological:     Mental Status: She is alert and oriented to person, place, and time.  Psychiatric:        Attention and Perception: Attention and perception normal.        Mood and Affect: Mood and affect normal.        Speech: Speech normal.        Behavior: Behavior normal. Behavior is cooperative.        Thought Content: Thought content normal.        Judgment: Judgment normal.     Comments: Good communication on telephone visit    Assessment and Plan: 1. Acute chest pain Reports that she still has ongoing acute chest pain of the predominantly right side.  Due to the history of having a Right sided lumpectomy, with seed implantation, and is on tamoxifen.  I think it would warrant her having a sooner visit oncologist,rather than the currently scheduled one in late May.  Appreciate all collaboration in her care. - appt with oncology changed to 4/22 at 10:10, patient made aware.   She reports that Tylenol and ibuprofen are not helping her.  I will provide her with a short duration of immediate acting Ultram.  Giving her age do not want to do the extended release.  She has an allergy to codeine will cover her for having any nausea and cross-reactivity and reactions by giving her Zofran to take should she need it. Patient acknowledged agreement and understanding of the plan.  Reviewed side effects, risks and benefits of medication.     - traMADol (ULTRAM) 50 MG tablet; Take 1 tablet (50 mg total) by mouth every 12 (twelve) hours as needed for up to 5 days.  Dispense: 10 tablet; Refill: 0  2. Nausea See  above. Patient acknowledged agreement and understanding of the plan.  Reviewed side effects, risks and benefits of medication.    - ondansetron (ZOFRAN) 4 MG tablet; Take 1 tablet (4 mg total) by mouth every 8 (eight) hours as needed for nausea or vomiting.  Dispense: 10 tablet; Refill: 0   Follow Up Instructions: AVS printed and mailed    I discussed the assessment and treatment plan with the patient. The patient was provided an opportunity to ask questions and all were answered. The patient agreed with the plan and demonstrated an understanding of the instructions.   The patient was advised to call back or seek an in-person evaluation if the symptoms worsen or if the condition fails to improve as anticipated.  I provided 15 minutes of non-face-to-face time during this encounter.  Perlie Mayo, NP

## 2019-01-10 ENCOUNTER — Other Ambulatory Visit: Payer: Self-pay | Admitting: Family Medicine

## 2019-01-10 ENCOUNTER — Telehealth: Payer: Self-pay

## 2019-01-10 MED ORDER — IBUPROFEN 800 MG PO TABS: ORAL_TABLET | ORAL | 0 refills | Status: DC

## 2019-01-10 NOTE — Telephone Encounter (Signed)
I sent in ibuprofen for 3 days then as needed, use sparingly, 20 tabs to last 12 weeks, at local pharmacy, pls let her know

## 2019-01-10 NOTE — Telephone Encounter (Signed)
Patient called stating the Tramadol is making her nauseous even if she takes the Zofran 30 mins prior to taking it and eating before hand. She is wanting to know if there is anything else that she can have. Did a virtual visit with Jarrett Soho last week for right sided chest pain. Has a visit with oncology 01/12/19.

## 2019-01-10 NOTE — Telephone Encounter (Signed)
Advised patient of new prescription and how to use it with verbal understanding.

## 2019-01-12 ENCOUNTER — Ambulatory Visit (HOSPITAL_COMMUNITY): Payer: Medicare HMO | Admitting: Hematology

## 2019-01-12 ENCOUNTER — Other Ambulatory Visit (HOSPITAL_COMMUNITY): Payer: Medicare HMO

## 2019-01-18 ENCOUNTER — Other Ambulatory Visit (HOSPITAL_COMMUNITY): Payer: Medicare HMO

## 2019-01-18 ENCOUNTER — Encounter (HOSPITAL_COMMUNITY): Payer: Medicare HMO

## 2019-01-31 ENCOUNTER — Inpatient Hospital Stay (HOSPITAL_COMMUNITY): Payer: Medicare HMO | Attending: Hematology

## 2019-01-31 ENCOUNTER — Other Ambulatory Visit: Payer: Self-pay

## 2019-01-31 ENCOUNTER — Ambulatory Visit (HOSPITAL_COMMUNITY)
Admission: RE | Admit: 2019-01-31 | Discharge: 2019-01-31 | Disposition: A | Discharge status: Home / Self Care | Payer: Medicare HMO | Source: Ambulatory Visit | Attending: Nurse Practitioner | Admitting: Nurse Practitioner

## 2019-01-31 DIAGNOSIS — I10 Essential (primary) hypertension: Secondary | ICD-10-CM | POA: Diagnosis not present

## 2019-01-31 DIAGNOSIS — E538 Deficiency of other specified B group vitamins: Secondary | ICD-10-CM | POA: Insufficient documentation

## 2019-01-31 DIAGNOSIS — Z7984 Long term (current) use of oral hypoglycemic drugs: Secondary | ICD-10-CM | POA: Diagnosis not present

## 2019-01-31 DIAGNOSIS — Z7982 Long term (current) use of aspirin: Secondary | ICD-10-CM | POA: Diagnosis not present

## 2019-01-31 DIAGNOSIS — D241 Benign neoplasm of right breast: Secondary | ICD-10-CM

## 2019-01-31 DIAGNOSIS — Z79899 Other long term (current) drug therapy: Secondary | ICD-10-CM | POA: Insufficient documentation

## 2019-01-31 DIAGNOSIS — D649 Anemia, unspecified: Secondary | ICD-10-CM | POA: Insufficient documentation

## 2019-01-31 DIAGNOSIS — N6091 Unspecified benign mammary dysplasia of right breast: Secondary | ICD-10-CM | POA: Diagnosis not present

## 2019-01-31 DIAGNOSIS — E119 Type 2 diabetes mellitus without complications: Secondary | ICD-10-CM | POA: Diagnosis not present

## 2019-01-31 DIAGNOSIS — Z1231 Encounter for screening mammogram for malignant neoplasm of breast: Secondary | ICD-10-CM | POA: Diagnosis not present

## 2019-01-31 LAB — COMPREHENSIVE METABOLIC PANEL
ALT: 13 U/L (ref 0–44)
AST: 17 U/L (ref 15–41)
Albumin: 3.7 g/dL (ref 3.5–5.0)
Alkaline Phosphatase: 54 U/L (ref 38–126)
Anion gap: 10 (ref 5–15)
BUN: 14 mg/dL (ref 8–23)
CO2: 26 mmol/L (ref 22–32)
Calcium: 9.2 mg/dL (ref 8.9–10.3)
Chloride: 108 mmol/L (ref 98–111)
Creatinine, Ser: 0.99 mg/dL (ref 0.44–1.00)
GFR calc Af Amer: >60 mL/min (ref >60)
GFR calc non Af Amer: 55 mL/min — ABNORMAL LOW (ref >60)
Glucose, Bld: 97 mg/dL (ref 70–99)
Potassium: 3.3 mmol/L — ABNORMAL LOW (ref 3.5–5.1)
Sodium: 144 mmol/L (ref 135–145)
Total Bilirubin: 0.7 mg/dL (ref 0.3–1.2)
Total Protein: 7 g/dL (ref 6.5–8.1)

## 2019-01-31 LAB — CBC WITH DIFFERENTIAL/PLATELET
Abs Immature Granulocytes: 0.07 10*3/uL (ref 0.00–0.07)
Basophils Absolute: 0 10*3/uL (ref 0.0–0.1)
Basophils Relative: 0 %
Eosinophils Absolute: 0 10*3/uL (ref 0.0–0.5)
Eosinophils Relative: 0 %
HCT: 37.2 % (ref 36.0–46.0)
Hemoglobin: 11.6 g/dL — ABNORMAL LOW (ref 12.0–15.0)
Immature Granulocytes: 1 %
Lymphocytes Relative: 32 %
Lymphs Abs: 2.3 10*3/uL (ref 0.7–4.0)
MCH: 30.7 pg (ref 26.0–34.0)
MCHC: 31.2 g/dL (ref 30.0–36.0)
MCV: 98.4 fL (ref 80.0–100.0)
Monocytes Absolute: 0.4 10*3/uL (ref 0.1–1.0)
Monocytes Relative: 6 %
Neutro Abs: 4.3 10*3/uL (ref 1.7–7.7)
Neutrophils Relative %: 61 %
Platelets: 170 10*3/uL (ref 150–400)
RBC: 3.78 MIL/uL — ABNORMAL LOW (ref 3.87–5.11)
RDW: 13.9 % (ref 11.5–15.5)
WBC: 7.1 10*3/uL (ref 4.0–10.5)
nRBC: 0 % (ref 0.0–0.2)

## 2019-01-31 LAB — LACTATE DEHYDROGENASE: LDH: 132 U/L (ref 98–192)

## 2019-01-31 LAB — IRON AND TIBC
Iron: 58 ug/dL (ref 28–170)
Saturation Ratios: 19 % (ref 10.4–31.8)
TIBC: 299 ug/dL (ref 250–450)
UIBC: 241 ug/dL

## 2019-01-31 LAB — FERRITIN: Ferritin: 66 ng/mL (ref 11–307)

## 2019-01-31 LAB — VITAMIN B12: Vitamin B-12: 170 pg/mL — ABNORMAL LOW (ref 180–914)

## 2019-01-31 LAB — FOLATE: Folate: 17.5 ng/mL (ref >5.9)

## 2019-02-01 ENCOUNTER — Ambulatory Visit (INDEPENDENT_AMBULATORY_CARE_PROVIDER_SITE_OTHER): Payer: Medicare HMO | Admitting: Family Medicine

## 2019-02-01 ENCOUNTER — Encounter: Payer: Self-pay | Admitting: Family Medicine

## 2019-02-01 VITALS — BP 126/79 | Ht 65.0 in | Wt 176.0 lb

## 2019-02-01 DIAGNOSIS — R0789 Other chest pain: Secondary | ICD-10-CM

## 2019-02-01 DIAGNOSIS — I10 Essential (primary) hypertension: Secondary | ICD-10-CM | POA: Diagnosis not present

## 2019-02-01 DIAGNOSIS — E1159 Type 2 diabetes mellitus with other circulatory complications: Secondary | ICD-10-CM | POA: Diagnosis not present

## 2019-02-01 DIAGNOSIS — E785 Hyperlipidemia, unspecified: Secondary | ICD-10-CM

## 2019-02-01 DIAGNOSIS — Z7189 Other specified counseling: Secondary | ICD-10-CM

## 2019-02-01 DIAGNOSIS — E559 Vitamin D deficiency, unspecified: Secondary | ICD-10-CM

## 2019-02-01 DIAGNOSIS — E663 Overweight: Secondary | ICD-10-CM

## 2019-02-01 NOTE — Patient Instructions (Addendum)
Wellness past due please schedule  Office visit with MD in mid to end October, call if you need me sooner  NEVER take ibuprofen for stomach pain, this has the potential to cause stomach irritation and bleeding  Ibuprofen can be used short term for joint pain, and muscle pain, I am thankful your chest pain has gone away!  Please continue to protect yourself to potential exposure to COVID 19  Please get fasting lipid, cmp and eGFR, vit D and HBA1C 1 week before next appointment  Social distancing. Frequent hand washing with soap and water Keeping your hands off of your face.wear a face mask, and remain 6 ft away from people you do not live with These 3 practices will help to keep both you and your community healthy during this time. Please practice them faithfully!    

## 2019-02-01 NOTE — Progress Notes (Signed)
Virtual Visit via Telephone Note  I connected with Jillian [name] on 02/01/19 at 11:00 AM EDT by telephone and verified that I am speaking with the correct person using two identifiers.  Location: Patient: home Provider: office   I discussed the limitations, risks, security and privacy concerns of performing an evaluation and management service by telephone and the availability of in person appointments. I also discussed with the patient that there may be a patient responsible charge related to this service. The patient expressed understanding and agreed to proceed. This visit type is conducted due to national recommendations for restrictions regarding the COVID -19 Pandemic. Due to the patient's age and / or co morbidities, this format is felt to be most appropriate at this time without adequate follow up. The patient has no access to video technology/ had technical difficulties with video, requiring transitioning to audio format  only ( telephone ). All issues noted this document were discussed and addressed,no physical exam can be performed in this format.   History of Present Illness: Chest wall pain resolved with 5 days of ibuprofen, had been to ED on 04/10 with c/o chest pain and was re evaluated for this on 01/06/2019. Reports that pain actually resolved wit ibuprofen, then she states that she even took the ibuprofen for stomach pain and I educated her about this explaining that this  is NOT the thing to do as ibuprofen can  Trigger stomach inflammation and burning, she understands and will refrain in the future   Observations/Objective: BP 126/79   Ht 5\' 5"  (1.651 m)   Wt 176 lb (79.8 kg)   BMI 29.29 kg/m  Good communication with no confusion and intact memory. Alert and oriented x 3 No signs of respiratory distress during sppech    Assessment and Plan:  Chest wall pain Resolved following short course of anti inflammatory  Educated About Covid-19 Virus Infection Covid-19  Education  The signs and symptoms of of COVID -19 were discussed with the patient and how to seek care for testing. ( follow up with PCP or arrange  E-visit) The importance of social  distancing is discussed today.   ESSENTIAL HYPERTENSION, BENIGN Controlled, no change in medication DASH diet and commitment to daily physical activity for a minimum of 30 minutes discussed and encouraged, as a part of hypertension management. The importance of attaining a healthy weight is also discussed.  BP/Weight 02/03/2019 02/01/2019 12/31/2018 11/08/2018 10/27/2018 10/11/2018 70/35/0093  Systolic BP 818 299 371 696 789 381 017  Diastolic BP 79 79 78 80 80 88 94  Wt. (Lbs) 179 176 176 176 177 176.04 176  BMI 30.73 29.29 29.29 29.29 29.45 29.29 30.21       Overweight (BMI 25.0-29.9) Deteriorated.  Patient re-educated about  the importance of commitment to a  minimum of 150 minutes of exercise per week as able.  The importance of healthy food choices with portion control discussed, as well as eating regularly and within a 12 hour window most days. The need to choose "clean , green" food 50 to 75% of the time is discussed, as well as to make water the primary drink and set a goal of 64 ounces water daily.   Weight /BMI 02/03/2019 02/01/2019 12/31/2018  WEIGHT 179 lb 176 lb 176 lb  HEIGHT 5\' 4"  5\' 5"  5\' 5"   BMI 30.73 kg/m2 29.29 kg/m2 29.29 kg/m2       Follow Up Instructions:    I discussed the assessment and treatment plan with  the patient. The patient was provided an opportunity to ask questions and all were answered. The patient agreed with the plan and demonstrated an understanding of the instructions.   The patient was advised to call back or seek an in-person evaluation if the symptoms worsen or if the condition fails to improve as anticipated.  I provided 22  minutes of non-face-to-face time during this encounter.   Tula Nakayama, MD

## 2019-02-03 ENCOUNTER — Encounter: Payer: Self-pay | Admitting: Family Medicine

## 2019-02-03 ENCOUNTER — Ambulatory Visit (INDEPENDENT_AMBULATORY_CARE_PROVIDER_SITE_OTHER): Payer: Medicare HMO | Admitting: Family Medicine

## 2019-02-03 ENCOUNTER — Other Ambulatory Visit: Payer: Self-pay

## 2019-02-03 ENCOUNTER — Other Ambulatory Visit (HOSPITAL_COMMUNITY): Payer: Medicare HMO

## 2019-02-03 VITALS — BP 126/79 | Ht 64.0 in | Wt 179.0 lb

## 2019-02-03 DIAGNOSIS — Z0000 Encounter for general adult medical examination without abnormal findings: Secondary | ICD-10-CM | POA: Diagnosis not present

## 2019-02-03 NOTE — Progress Notes (Signed)
Subjective:   Jillian [name] is a 78 y.o. female who presents for Medicare Annual (Subsequent) preventive examination.  Location of Patient: Home Location of Provider: Telehealth Consent was obtain for visit to be over via telehealth. I verified that I am speaking with the correct person using two identifiers.   Review of Systems:    Cardiac Risk Factors include: advanced age (>83men, >15 women);diabetes mellitus;dyslipidemia;hypertension     Objective:     Vitals: BP 126/79   Ht 5\' 4"  (1.626 m)   Wt 179 lb (81.2 kg)   BMI 30.73 kg/m   Body mass index is 30.73 kg/m.  Advanced Directives 12/31/2018 08/18/2018 08/09/2018 08/02/2018 06/14/2018 05/19/2018 05/17/2018  Does Patient Have a Medical Advance Directive? No;Yes Yes Yes No;Yes Yes Yes Yes  Type of Paramedic of [place];Living will Living will Living will Living will [place];Living will Living will;Healthcare Power of Attorney Living will;Healthcare Power of Attorney  Does patient want to make changes to medical advance directive? - No - Patient declined - - - No - Patient declined No - Patient declined  Copy of Healthcare Power of Attorney in Chart? - - - - - No - copy requested -  Would patient like information on creating a medical advance directive? - - - - - No - Patient declined No - Patient declined  Pre-existing out of facility DNR order (yellow form or pink MOST form) - - - - - - -    Tobacco Social History   Tobacco Use  Smoking Status Never Smoker  Smokeless Tobacco Never Used     Counseling given: Yes   Clinical Intake:  Pre-visit preparation completed: Yes  Pain : No/denies pain     BMI - recorded: 30.73 Diabetes: Yes CBG done?: No Did pt. bring in CBG monitor from home?: No  How often do you need to have someone help you when you read instructions, pamphlets, or other written materials from your doctor or pharmacy?: 1 - Never What is the last  grade level you completed in school?: 12  Interpreter Needed?: No     Past Medical History:  Diagnosis Date  . Chronic fatigue   . Diabetes mellitus 2011  . DJD (degenerative joint disease)    of neck    . Dyslipidemia   . 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 Hypertension 2005  . Mild obesity   . Postmenopausal   . Viral meningitis   . Wears glasses    Past Surgical History:  Procedure Laterality Date  . ABDOMINAL HYSTERECTOMY  1992   fibroids  . APPENDECTOMY    . BREAST EXCISIONAL BIOPSY Right    benign  . BREAST LUMPECTOMY WITH RADIOACTIVE SEED LOCALIZATION Right 10/05/2014   Procedure: BREAST LUMPECTOMY WITH RADIOACTIVE SEED LOCALIZATION;  Surgeon: Erroll Luna, MD;  Location: [facility];  Service: General;  Laterality: Right;  . cataract Bilateral   . CYST EXCISION Right    arm  . EYE SURGERY Right 06/07/2013   cataract  . EYE SURGERY Left 09/14/2013   cataract  . KNEE ARTHROSCOPY Right 02/26/2016   Procedure: ARTHROSCOPY RIGHT KNEE WITH MENSICAL DEBRIDEMENT;  Surgeon: Gaynelle Arabian, MD;  Location: [facility];  Service: Orthopedics;  Laterality: Right;  . left breast biopsy    . LESION REMOVAL Right 03/30/2013   Procedure: EXCISION OF NEOPLASM ARM;  Surgeon: Jamesetta So, MD;  Location:  [facility];  Service: General;  Laterality: Right;  . TUBAL LIGATION     Family History  Problem Relation Age of Onset  . Hypertension Father   . Congestive Heart Failure Father   . Liver cancer Mother   . Hypertension Sister   . Hypertension Brother   . Diabetes Brother    Social History   Socioeconomic History  . Marital status: Widowed    Spouse name: Not on file  . Number of children: Not on file  . Years of education: Not on file  . Highest education level: Not on file  Occupational History  . Not on file  Social Needs  . Financial resource strain: Not hard at all  . Food insecurity:    Worry:  Never true    Inability: Never true  . Transportation needs:    Medical: No    Non-medical: No  Tobacco Use  . Smoking status: Never Smoker  . Smokeless tobacco: Never Used  Substance and Sexual Activity  . Alcohol use: No  . Drug use: No  . Sexual activity: Not Currently    Birth control/protection: Surgical  Lifestyle  . Physical activity:    Days per week: 0 days    Minutes per session: 0 min  . Stress: Not at all  Relationships  . Social connections:    Talks on phone: More than three times a week    Gets together: Once a week    Attends religious service: More than 4 times per year    Active member of club or organization: Yes    Attends meetings of clubs or organizations: More than 4 times per year    Relationship status: Widowed  Other Topics Concern  . Not on file  Social History Narrative  . Not on file    Outpatient Encounter Medications as of 02/03/2019  Medication Sig  . acetaminophen (TYLENOL) 500 MG tablet Take 1,000 mg by mouth every 6 (six) hours as needed for mild pain.  Marland Kitchen aspirin EC 81 MG tablet Take 1 tablet (81 mg total) by mouth every evening. (Patient taking differently: Take 81 mg by mouth every morning. )  . cloNIDine (CATAPRES) 0.1 MG tablet Take one tablet once daily at bedtime (Patient taking differently: daily. Take one tablet once daily at bedtime)  . ibuprofen (ADVIL) 800 MG tablet Take one tablet two times daily for 3 days, then as needed  . metFORMIN (GLUCOPHAGE-XR) 500 MG 24 hr tablet Take 1 tablet (500 mg total) by mouth 2 (two) times daily.  . ondansetron (ZOFRAN) 4 MG tablet Take 1 tablet (4 mg total) by mouth every 8 (eight) hours as needed for nausea or vomiting.  . pravastatin (PRAVACHOL) 10 MG tablet Take 10 mg by mouth daily.  . tamoxifen (NOLVADEX) 20 MG tablet Take 1 tablet (20 mg total) by mouth daily.   No facility-administered encounter medications on file as of 02/03/2019.     Activities of Daily Living In your present state  of health, do you have any difficulty performing the following activities: 02/03/2019 05/17/2018  Hearing? N N  Vision? N N  Difficulty concentrating or making decisions? N N  Walking or climbing stairs? N Y  Dressing or bathing? N N  Doing errands, shopping? N N  Preparing Food and eating ? N -  Using the Toilet? N -  In the past six months, have you accidently leaked urine? N -  Do you have problems with loss of bowel control? N -  Managing your Medications? N -  Managing your Finances? N -  Housekeeping or managing your Housekeeping? N -  Some recent data might be hidden    Patient Care Team: Fayrene Helper, MD as PCP - General Fields, Marga Melnick, MD (Gastroenterology) Hurley Cisco, MD as Consulting Physician (Rheumatology) Whitney Muse, Kelby Fam, MD (Inactive) as Consulting Physician (Hematology and Oncology)    Assessment:   This is a routine wellness examination for Jillian [name].  Exercise Activities and Dietary recommendations Current Exercise Habits: The patient does not participate in regular exercise at present, Exercise limited by: None identified  Goals    . Exercise 3x per week (30 min per time)     Recommend starting a routine exercise program at least 3 days a week for 30-45 minutes at a time as tolerated.         Fall Risk Fall Risk  02/03/2019 02/01/2019 01/05/2019 11/08/2018 10/27/2018  Falls in the past year? 0 0 0 1 1  Number falls in past yr: - 0 0 1 0  Injury with Fall? 0 0 0 0 0  Risk Factor Category  - - - - -  Risk for fall due to : - - - - -  Follow up - - - - -   Is the patient's home free of loose throw rugs in walkways, pet beds, electrical cords, etc?   yes      Grab bars in the bathroom? yes      Handrails on the stairs?   no stairs      Adequate lighting?   yes  Timed Get Up and Go performed:  Telemedicine unable to assess  Depression Screen PHQ 2/9 Scores 02/03/2019 02/01/2019 11/08/2018 10/11/2018  PHQ - 2 Score 0 0 0 0  PHQ- 9 Score - - - -      Cognitive Function     6CIT Screen 02/03/2019 01/11/2018 01/01/2017  What Year? 0 points 0 points 0 points  What month? 0 points 0 points 0 points  What time? 0 points 0 points 0 points  Count back from 20 4 points 0 points 0 points  Months in reverse 0 points 0 points 0 points  Repeat phrase 0 points 0 points 0 points  Total Score 4 0 0    Immunization History  Administered Date(s) Administered  . H1N1 10/16/2008  . Influenza Whole 07/02/2007, 06/14/2009, 06/04/2010  . Influenza,inj,Quad PF,6+ Mos 07/20/2013, 06/01/2014, 06/26/2015, 07/28/2016, 05/19/2017  . Pneumococcal Conjugate-13 09/28/2014  . Pneumococcal Polysaccharide-23 04/20/2009  . Td 05/01/2004  . Zoster 10/23/2006    Qualifies for Shingles Vaccine? completed Screening Tests Health Maintenance  Topic Date Due  . TETANUS/TDAP  10/28/2019 (Originally 05/01/2014)  . INFLUENZA VACCINE  04/23/2019  . FOOT EXAM  05/07/2019  . HEMOGLOBIN A1C  05/09/2019  . OPHTHALMOLOGY EXAM  06/08/2019  . DEXA SCAN  Completed  . PNA vac Low Risk Adult  Completed    Cancer Screenings: Lung: Low Dose CT Chest recommended if Age 23-80 years, 30 pack-year currently smoking OR have quit w/in 15years. Patient does not qualify. Breast:  Up to date on Mammogram? Yes   Up to date of Bone Density/Dexa? Yes Colorectal:  Due 2023    Additional Screenings:   Hepatitis C Screening: Discuss Dr Moshe Cipro      Plan:      1. Encounter for Medicare annual wellness exam  I have personally reviewed and noted the following in the patient's chart:   . Medical and social  history . Use of alcohol, tobacco or illicit drugs  . Current medications and supplements . Functional ability and status . Nutritional status . Physical activity . Advanced directives . List of other physicians . Hospitalizations, surgeries, and ER visits in previous 12 months . Vitals . Screenings to include cognitive, depression, and falls . Referrals and appointments   In addition, I have reviewed and discussed with patient certain preventive protocols, quality metrics, and best practice recommendations. A written personalized care plan for preventive services as well as general preventive health recommendations were provided to patient.   I provided 20 minutes of non-face-to-face time during this encounter.  Perlie Mayo, NP  02/03/2019

## 2019-02-03 NOTE — Patient Instructions (Addendum)
Jillian [name] , Thank you for taking time to come for your Medicare Wellness Visit. I appreciate your ongoing commitment to your health goals. Please review the following plan we discussed and let me know if I can assist you in the future.   Please continue to practice social distancing during this time to keep you in our community safe.  Screening recommendations/referrals: Colonoscopy: Due 2023 per chart Mammogram: Completed recheck 2021 Bone Density: Completed, your bones did show some thinning.  Make sure you are taking your calcium and vitamin D. Recommended yearly ophthalmology/optometry visit for glaucoma screening and checkup Recommended yearly dental visit for hygiene and checkup  Vaccinations: Influenza vaccine: Due fall 2020 Pneumococcal vaccine: Completed Tdap vaccine: Due 2021 per chart Shingles vaccine: Completed  Advanced directives: If you have any questions about advanced directives just let us know we will be glad to discuss these at your office visit.  Conditions/risks identified: Falls, everyone is always at risk for falls and this can always be something that is preventable.  Please read the information below to make sure that you are home is safe.  Next appointment: 07/18/2019 @ 11 am with Dr Moshe Cipro   Preventive Care 77 Years and Older, Female Preventive care refers to lifestyle choices and visits with your health care provider that can promote health and wellness. What does preventive care include?  A yearly physical exam. This is also called an annual well check.  Dental exams once or twice a year.  Routine eye exams. Ask your health care provider how often you should have your eyes checked.  Personal lifestyle choices, including:  Daily care of your teeth and gums.  Regular physical activity.  Eating a healthy diet.  Avoiding tobacco and drug use.  Limiting alcohol use.  Practicing safe sex.  Taking low-dose aspirin every day.  Taking  vitamin and mineral supplements as recommended by your health care provider. What happens during an annual well check? The services and screenings done by your health care provider during your annual well check will depend on your age, overall health, lifestyle risk factors, and family history of disease. Counseling  Your health care provider may ask you questions about your:  Alcohol use.  Tobacco use.  Drug use.  Emotional well-being.  Home and relationship well-being.  Sexual activity.  Eating habits.  History of falls.  Memory and ability to understand (cognition).  Work and work Statistician.  Reproductive health. Screening  You may have the following tests or measurements:  Height, weight, and BMI.  Blood pressure.  Lipid and cholesterol levels. These may be checked every 5 years, or more frequently if you are over 27 years old.  Skin check.  Lung cancer screening. You may have this screening every year starting at age 46 if you have a 30-pack-year history of smoking and currently smoke or have quit within the past 15 years.  Fecal occult blood test (FOBT) of the stool. You may have this test every year starting at age 60.  Flexible sigmoidoscopy or colonoscopy. You may have a sigmoidoscopy every 5 years or a colonoscopy every 10 years starting at age 77.  Hepatitis C blood test.  Hepatitis B blood test.  Sexually transmitted disease (STD) testing.  Diabetes screening. This is done by checking your blood sugar (glucose) after you have not eaten for a while (fasting). You may have this done every 1-3 years.  Bone density scan. This is done to screen for osteoporosis. You may have this done starting  at age 77.  Mammogram. This may be done every 1-2 years. Talk to your health care provider about how often you should have regular mammograms. Talk with your health care provider about your test results, treatment options, and if necessary, the need for more  tests. Vaccines  Your health care provider may recommend certain vaccines, such as:  Influenza vaccine. This is recommended every year.  Tetanus, diphtheria, and acellular pertussis (Tdap, Td) vaccine. You may need a Td booster every 10 years.  Zoster vaccine. You may need this after age 77.  Pneumococcal 13-valent conjugate (PCV13) vaccine. One dose is recommended after age 77.  Pneumococcal polysaccharide (PPSV23) vaccine. One dose is recommended after age 77. Talk to your health care provider about which screenings and vaccines you need and how often you need them. This information is not intended to replace advice given to you by your health care provider. Make sure you discuss any questions you have with your health care provider. Document Released: 10/05/2015 Document Revised: 05/28/2016 Document Reviewed: 07/10/2015 Elsevier Interactive Patient Education  2017 [place] Prevention in the Home Falls can cause injuries. They can happen to people of all ages. There are many things you can do to make your home safe and to help prevent falls. What can I do on the outside of my home?  Regularly fix the edges of walkways and driveways and fix any cracks.  Remove anything that might make you trip as you walk through a door, such as a raised step or threshold.  Trim any bushes or trees on the path to your home.  Use bright outdoor lighting.  Clear any walking paths of anything that might make someone trip, such as rocks or tools.  Regularly check to see if handrails are loose or broken. Make sure that both sides of any steps have handrails.  Any raised decks and porches should have guardrails on the edges.  Have any leaves, snow, or ice cleared regularly.  Use sand or salt on walking paths during winter.  Clean up any spills in your garage right away. This includes oil or grease spills. What can I do in the bathroom?  Use night lights.  Install grab bars by the  toilet and in the tub and shower. Do not use towel bars as grab bars.  Use non-skid mats or decals in the tub or shower.  If you need to sit down in the shower, use a plastic, non-slip stool.  Keep the floor dry. Clean up any water that spills on the floor as soon as it happens.  Remove soap buildup in the tub or shower regularly.  Attach bath mats securely with double-sided non-slip rug tape.  Do not have throw rugs and other things on the floor that can make you trip. What can I do in the bedroom?  Use night lights.  Make sure that you have a light by your bed that is easy to reach.  Do not use any sheets or blankets that are too big for your bed. They should not hang down onto the floor.  Have a firm chair that has side arms. You can use this for support while you get dressed.  Do not have throw rugs and other things on the floor that can make you trip. What can I do in the kitchen?  Clean up any spills right away.  Avoid walking on wet floors.  Keep items that you use a lot in easy-to-reach places.  If  you need to reach something above you, use a strong step stool that has a grab bar.  Keep electrical cords out of the way.  Do not use floor polish or wax that makes floors slippery. If you must use wax, use non-skid floor wax.  Do not have throw rugs and other things on the floor that can make you trip. What can I do with my stairs?  Do not leave any items on the stairs.  Make sure that there are handrails on both sides of the stairs and use them. Fix handrails that are broken or loose. Make sure that handrails are as long as the stairways.  Check any carpeting to make sure that it is firmly attached to the stairs. Fix any carpet that is loose or worn.  Avoid having throw rugs at the top or bottom of the stairs. If you do have throw rugs, attach them to the floor with carpet tape.  Make sure that you have a light switch at the top of the stairs and the bottom of  the stairs. If you do not have them, ask someone to add them for you. What else can I do to help prevent falls?  Wear shoes that:  Do not have high heels.  Have rubber bottoms.  Are comfortable and fit you well.  Are closed at the toe. Do not wear sandals.  If you use a stepladder:  Make sure that it is fully opened. Do not climb a closed stepladder.  Make sure that both sides of the stepladder are locked into place.  Ask someone to hold it for you, if possible.  Clearly mark and make sure that you can see:  Any grab bars or handrails.  First and last steps.  Where the edge of each step is.  Use tools that help you move around (mobility aids) if they are needed. These include:  Canes.  Walkers.  Scooters.  Crutches.  Turn on the lights when you go into a dark area. Replace any light bulbs as soon as they burn out.  Set up your furniture so you have a clear path. Avoid moving your furniture around.  If any of your floors are uneven, fix them.  If there are any pets around you, be aware of where they are.  Review your medicines with your doctor. Some medicines can make you feel dizzy. This can increase your chance of falling. Ask your doctor what other things that you can do to help prevent falls. This information is not intended to replace advice given to you by your health care provider. Make sure you discuss any questions you have with your health care provider. Document Released: 07/05/2009 Document Revised: 02/14/2016 Document Reviewed: 10/13/2014 Elsevier Interactive Patient Education  2017 [employer].

## 2019-02-06 ENCOUNTER — Encounter: Payer: Self-pay | Admitting: Family Medicine

## 2019-02-06 DIAGNOSIS — E669 Obesity, unspecified: Secondary | ICD-10-CM | POA: Insufficient documentation

## 2019-02-06 DIAGNOSIS — Z7189 Other specified counseling: Secondary | ICD-10-CM | POA: Insufficient documentation

## 2019-02-06 DIAGNOSIS — E66811 Obesity, class 1: Secondary | ICD-10-CM | POA: Insufficient documentation

## 2019-02-06 NOTE — Assessment & Plan Note (Signed)
Covid-19 Education  The signs and symptoms of of COVID -19 were discussed with the patient and how to seek care for testing. ( follow up with PCP or arrange  E-visit) The importance of social  distancing is discussed today.  

## 2019-02-06 NOTE — Assessment & Plan Note (Signed)
Controlled, no change in medication DASH diet and commitment to daily physical activity for a minimum of 30 minutes discussed and encouraged, as a part of hypertension management. The importance of attaining a healthy weight is also discussed.  BP/Weight 02/03/2019 02/01/2019 12/31/2018 11/08/2018 10/27/2018 10/11/2018 00/92/3300  Systolic BP 762 263 335 456 256 389 373  Diastolic BP 79 79 78 80 80 88 94  Wt. (Lbs) 179 176 176 176 177 176.04 176  BMI 30.73 29.29 29.29 29.29 29.45 29.29 30.21

## 2019-02-06 NOTE — Assessment & Plan Note (Signed)
Resolved following short course of anti inflammatory

## 2019-02-06 NOTE — Assessment & Plan Note (Signed)
Deteriorated.  Patient re-educated about  the importance of commitment to a  minimum of 150 minutes of exercise per week as able.  The importance of healthy food choices with portion control discussed, as well as eating regularly and within a 12 hour window most days. The need to choose "clean , green" food 50 to 75% of the time is discussed, as well as to make water the primary drink and set a goal of 64 ounces water daily.   Weight /BMI 02/03/2019 02/01/2019 12/31/2018  WEIGHT 179 lb 176 lb 176 lb  HEIGHT 5\' 4"  5\' 5"  5\' 5"   BMI 30.73 kg/m2 29.29 kg/m2 29.29 kg/m2

## 2019-02-07 ENCOUNTER — Other Ambulatory Visit: Payer: Self-pay

## 2019-02-07 ENCOUNTER — Inpatient Hospital Stay (HOSPITAL_COMMUNITY): Payer: Medicare HMO | Admitting: Nurse Practitioner

## 2019-02-07 VITALS — BP 132/68 | HR 88 | Temp 97.6°F | Resp 18 | Wt 181.6 lb

## 2019-02-07 DIAGNOSIS — Z7982 Long term (current) use of aspirin: Secondary | ICD-10-CM

## 2019-02-07 DIAGNOSIS — D241 Benign neoplasm of right breast: Secondary | ICD-10-CM

## 2019-02-07 DIAGNOSIS — E538 Deficiency of other specified B group vitamins: Secondary | ICD-10-CM

## 2019-02-07 DIAGNOSIS — Z7984 Long term (current) use of oral hypoglycemic drugs: Secondary | ICD-10-CM | POA: Diagnosis not present

## 2019-02-07 DIAGNOSIS — E119 Type 2 diabetes mellitus without complications: Secondary | ICD-10-CM

## 2019-02-07 DIAGNOSIS — D649 Anemia, unspecified: Secondary | ICD-10-CM | POA: Diagnosis not present

## 2019-02-07 DIAGNOSIS — I10 Essential (primary) hypertension: Secondary | ICD-10-CM | POA: Diagnosis not present

## 2019-02-07 DIAGNOSIS — N6091 Unspecified benign mammary dysplasia of right breast: Secondary | ICD-10-CM | POA: Diagnosis not present

## 2019-02-07 DIAGNOSIS — Z79899 Other long term (current) drug therapy: Secondary | ICD-10-CM | POA: Diagnosis not present

## 2019-02-07 MED ORDER — CYANOCOBALAMIN 1000 MCG/ML IJ SOLN
1000.0000 ug | Freq: Once | INTRAMUSCULAR | Status: AC
  Administered 2019-02-07: 1000 ug via INTRAMUSCULAR

## 2019-02-07 NOTE — Assessment & Plan Note (Signed)
1.  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 - Patient started tamoxifen early 2016.  She is tolerating it very well.   -She denies any vaginal bleeding or spotting.  Her mobility is limited from the sequelae of viral meningitis in August. - Her last mammogram was 01/31/2019 which was B RADS category 1 negative.  We reviewed the results today. - Physical examination today shows right breast lower inner quadrant scar with no palpable masses, no palpable adenopathy. -We will continue the tamoxifen for 5 years. - She will follow-up in 6 months with repeat labs.  2.  Normocytic anemia: - Labs on 01/31/2019 showed her hemoglobin at 11.6, ferritin 66, percent saturation 19.  Platelets 170 and LDH 132. -I do not recommend any IV Feraheme at this time. - We will continue to monitor.  3.  Vitamin B-12 deficiency: -Labs on 01/31/2019 showed her B12 level at 170. - We will give her a vitamin B12 injection today. -She will start on oral B12 tablets daily -We will recheck her labs on her next visit.

## 2019-02-07 NOTE — Patient Instructions (Signed)
[place] at [facility] Discharge Instructions   Please start taking Vitamin B12 daily. You may get this at any drug store of your choice.   Follow up in 6 months with labs    Thank you for choosing [place] at [facility] to provide your oncology and hematology care.  To afford each patient quality time with our provider, please arrive at least 15 minutes before your scheduled appointment time.   If you have a lab appointment with the [facility] please come in thru the  Main Entrance and check in at the main information desk  You need to re-schedule your appointment should you arrive 10 or more minutes late.  We strive to give you quality time with our providers, and arriving late affects you and other patients whose appointments are after yours.  Also, if you no show three or more times for appointments you may be dismissed from the clinic at the providers discretion.     Again, thank you for choosing [facility].  Our hope is that these requests will decrease the amount of time that you wait before being seen by our physicians.       _____________________________________________________________  Should you have questions after your visit to [facility], please contact our office at [phone] between the hours of 8:00 a.m. and 4:30 p.m.  Voicemails left after 4:00 p.m. will not be returned until the following business day.  For prescription refill requests, have your pharmacy contact our office and allow 72 hours.    Cancer Center Support Programs:   > Cancer Support Group  2nd Tuesday of the month 1pm-2pm, Journey Room

## 2019-02-07 NOTE — Progress Notes (Signed)
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  She has been doing well since her last visit.  She has had no issues and no complaints at this time.  She recently had her mammogram and she was pleased with the results.  She has no complaints of fatigue or tiredness.  She denies any bleeding. Denies any nausea, vomiting, or diarrhea. Denies any new pains. Had not noticed any recent bleeding such as epistaxis, hematuria or hematochezia. Denies recent chest pain on exertion, shortness of breath on minimal exertion, pre-syncopal episodes, or palpitations. Denies any numbness or tingling in hands or feet. Denies any recent fevers, infections, or recent hospitalizations. Patient reports appetite at 100% and energy level at 100%.  She is eating well and maintaining her weight at this time.    REVIEW OF SYSTEMS:  Review of Systems  All other systems reviewed and are negative.    PAST MEDICAL/SURGICAL HISTORY:  Past Medical History:  Diagnosis Date  . Chronic fatigue   . Diabetes mellitus 2011  . DJD (degenerative joint disease)    of neck    . Dyslipidemia   . 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 Hypertension 2005  . Mild obesity   . Postmenopausal   . Viral meningitis   . Wears glasses    Past Surgical History:  Procedure Laterality Date  . ABDOMINAL HYSTERECTOMY  1992   fibroids  . APPENDECTOMY    . BREAST EXCISIONAL BIOPSY Right    benign  . BREAST LUMPECTOMY WITH  RADIOACTIVE SEED LOCALIZATION Right 10/05/2014   Procedure: BREAST LUMPECTOMY WITH RADIOACTIVE SEED LOCALIZATION;  Surgeon: Erroll Luna, MD;  Location: [facility];  Service: General;  Laterality: Right;  . cataract Bilateral   . CYST EXCISION Right    arm  . EYE SURGERY Right 06/07/2013   cataract  . EYE SURGERY Left 09/14/2013   cataract  . KNEE ARTHROSCOPY Right 02/26/2016   Procedure: ARTHROSCOPY RIGHT KNEE WITH MENSICAL DEBRIDEMENT;  Surgeon: Gaynelle Arabian, MD;  Location: [facility];  Service: Orthopedics;  Laterality: Right;  . left breast biopsy    . LESION REMOVAL Right 03/30/2013   Procedure: EXCISION OF NEOPLASM ARM;  Surgeon: Jamesetta So, MD;  Location: [facility];  Service: General;  Laterality: Right;  . TUBAL LIGATION       SOCIAL HISTORY:  Social History   Socioeconomic History  . Marital status: Widowed    Spouse name: Not on file  . Number of children: Not on file  . Years of education: Not on file  . Highest education level: Not on file  Occupational History  . Not on file  Social Needs  . Financial resource strain: Not hard at all  . Food insecurity:    Worry: Never true    Inability: Never true  . Transportation needs:    Medical: No    Non-medical: No  Tobacco Use  . Smoking status: Never Smoker  . Smokeless tobacco: Never Used  Substance and Sexual Activity  .  Alcohol use: No  . Drug use: No  . Sexual activity: Not Currently    Birth control/protection: Surgical  Lifestyle  . Physical activity:    Days per week: 0 days    Minutes per session: 0 min  . Stress: Not at all  Relationships  . Social connections:    Talks on phone: More than three times a week    Gets together: Once a week    Attends religious service: More than 4 times per year    Active member of club or organization: Yes    Attends meetings of clubs or organizations: More than 4 times per year    Relationship status: Widowed  . Intimate partner violence:    Fear of  current or ex partner: No    Emotionally abused: No    Physically abused: No    Forced sexual activity: No  Other Topics Concern  . Not on file  Social History Narrative  . Not on file    FAMILY HISTORY:  Family History  Problem Relation Age of Onset  . Hypertension Father   . Congestive Heart Failure Father   . Liver cancer Mother   . Hypertension Sister   . Hypertension Brother   . Diabetes Brother     CURRENT MEDICATIONS:  Outpatient Encounter Medications as of 02/07/2019  Medication Sig  . acetaminophen (TYLENOL) 500 MG tablet Take 1,000 mg by mouth every 6 (six) hours as needed for mild pain.  Marland Kitchen aspirin EC 81 MG tablet Take 1 tablet (81 mg total) by mouth every evening. (Patient taking differently: Take 81 mg by mouth every morning. )  . cloNIDine (CATAPRES) 0.1 MG tablet Take one tablet once daily at bedtime (Patient taking differently: daily. Take one tablet once daily at bedtime)  . ibuprofen (ADVIL) 800 MG tablet Take one tablet two times daily for 3 days, then as needed  . metFORMIN (GLUCOPHAGE-XR) 500 MG 24 hr tablet Take 1 tablet (500 mg total) by mouth 2 (two) times daily.  . ondansetron (ZOFRAN) 4 MG tablet Take 1 tablet (4 mg total) by mouth every 8 (eight) hours as needed for nausea or vomiting.  . pravastatin (PRAVACHOL) 10 MG tablet Take 10 mg by mouth daily.  . tamoxifen (NOLVADEX) 20 MG tablet Take 1 tablet (20 mg total) by mouth daily.  . [EXPIRED] cyanocobalamin ((VITAMIN B-12)) injection 1,000 mcg    No facility-administered encounter medications on file as of 02/07/2019.     ALLERGIES:  Allergies  Allergen Reactions  . Codeine Nausea And Vomiting  . Propoxyphene N-Acetaminophen Other (See Comments)    hallucinations     PHYSICAL EXAM:  ECOG Performance status: 1  Vitals:   02/07/19 0906  BP: 132/68  Pulse: 88  Resp: 18  Temp: 97.6 F (36.4 C)  SpO2: 98%   Filed Weights   02/07/19 0906  Weight: 181 lb 9.6 oz (82.4 kg)    Physical  Exam Constitutional:      Appearance: Normal appearance. She is normal weight.  Cardiovascular:     Rate and Rhythm: Normal rate and regular rhythm.     Heart sounds: Normal heart sounds.  Pulmonary:     Effort: Pulmonary effort is normal.     Breath sounds: Normal breath sounds.  Abdominal:     General: Bowel sounds are normal.     Palpations: Abdomen is soft.  Musculoskeletal: Normal range of motion.  Skin:    General: Skin is warm and dry.  Neurological:  Mental Status: She is alert and oriented to person, place, and time. Mental status is at baseline.  Psychiatric:        Mood and Affect: Mood normal.        Behavior: Behavior normal.        Thought Content: Thought content normal.        Judgment: Judgment normal.   Breast: No palpable masses, no skin changes or nipple discharge, no adenopathy.  Lumpectomy site scar within normal limits.   LABORATORY DATA:  I have reviewed the labs as listed.  CBC    Component Value Date/Time   WBC 7.1 01/31/2019 0922   RBC 3.78 (L) 01/31/2019 0922   HGB 11.6 (L) 01/31/2019 0922   HCT 37.2 01/31/2019 0922   PLT 170 01/31/2019 0922   MCV 98.4 01/31/2019 0922   MCH 30.7 01/31/2019 0922   MCHC 31.2 01/31/2019 0922   RDW 13.9 01/31/2019 0922   LYMPHSABS 2.3 01/31/2019 0922   MONOABS 0.4 01/31/2019 0922   EOSABS 0.0 01/31/2019 0922   BASOSABS 0.0 01/31/2019 0922   CMP Latest Ref Rng & Units 01/31/2019 12/31/2018 11/08/2018  Glucose 70 - 99 mg/dL 97 85 102(H)  BUN 8 - 23 mg/dL 14 14 15   Creatinine 0.44 - 1.00 mg/dL 0.99 0.84 0.97(H)  Sodium 135 - 145 mmol/L 144 141 143  Potassium 3.5 - 5.1 mmol/L 3.3(L) 3.7 4.3  Chloride 98 - 111 mmol/L 108 108 109  CO2 22 - 32 mmol/L 26 25 27   Calcium 8.9 - 10.3 mg/dL 9.2 9.3 9.3  Total Protein 6.5 - 8.1 g/dL 7.0 6.9 6.8  Total Bilirubin 0.3 - 1.2 mg/dL 0.7 0.5 0.5  Alkaline Phos 38 - 126 U/L 54 52 -  AST 15 - 41 U/L 17 15 12   ALT 0 - 44 U/L 13 12 7        DIAGNOSTIC IMAGING:  I have  independently reviewed the mammogram results and discussed with the patient.  I personally performed a face-to-face visit.  All questions were answered to patient's stated satisfaction. Encouraged patient to call with any new concerns or questions before his next visit to the cancer center and we can certain see him sooner, if needed.     ASSESSMENT & PLAN:   Papilloma of right breast 1.  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 - Patient started tamoxifen early 2016.  She is tolerating it very well.   -She denies any vaginal bleeding or spotting.  Her mobility is limited from the sequelae of viral meningitis in August. - Her last mammogram was 01/31/2019 which was B RADS category 1 negative.  We reviewed the results today. - Physical examination today shows right breast lower inner quadrant scar with no palpable masses, no palpable adenopathy. -We will continue the tamoxifen for 5 years. - She will follow-up in 6 months with repeat labs.  2.  Normocytic anemia: - Labs on 01/31/2019 showed her hemoglobin at 11.6, ferritin 66, percent saturation 19.  Platelets 170 and LDH 132. -I do not recommend any IV Feraheme at this time. - We will continue to monitor.  3.  Vitamin B-12 deficiency: -Labs on 01/31/2019 showed her B12 level at 170. - We will give her a vitamin B12 injection today. -She will start on oral B12 tablets daily -We will recheck her labs on her next visit.      Orders placed this encounter:  Orders Placed This  Encounter  Procedures  . Lactate dehydrogenase  . CBC with Differential/Platelet  . Comprehensive metabolic panel  . Ferritin  . Iron and TIBC  . Vitamin B12  . VITAMIN D 25 Hydroxy (Vit-D Deficiency, Fractures)  . Folate      Francene Finders, FNP-C [place] [phone]

## 2019-02-07 NOTE — Progress Notes (Signed)
Patient tolerated injection with no complaints voiced.  Site clean and dry with no bruising or swelling noted at site.  Band aid applied.  Vss with discharge and left ambulatory with no s/s of distress noted.  

## 2019-02-15 ENCOUNTER — Ambulatory Visit (INDEPENDENT_AMBULATORY_CARE_PROVIDER_SITE_OTHER): Payer: Medicare HMO | Admitting: Family Medicine

## 2019-02-15 ENCOUNTER — Encounter: Payer: Self-pay | Admitting: Family Medicine

## 2019-02-15 VITALS — BP 132/68 | Ht 64.0 in | Wt 181.0 lb

## 2019-02-15 DIAGNOSIS — I10 Essential (primary) hypertension: Secondary | ICD-10-CM

## 2019-02-15 DIAGNOSIS — M546 Pain in thoracic spine: Secondary | ICD-10-CM

## 2019-02-15 DIAGNOSIS — Z7189 Other specified counseling: Secondary | ICD-10-CM | POA: Diagnosis not present

## 2019-02-15 MED ORDER — IBUPROFEN 800 MG PO TABS: ORAL_TABLET | ORAL | 1 refills | Status: DC

## 2019-02-15 MED ORDER — PREDNISONE 10 MG PO TABS: 10.0000 mg | ORAL_TABLET | Freq: Two times a day (BID) | ORAL | 0 refills | Status: DC

## 2019-02-15 MED ORDER — PANTOPRAZOLE SODIUM 40 MG PO TBEC: 40.0000 mg | DELAYED_RELEASE_TABLET | Freq: Every day | ORAL | 0 refills | Status: DC

## 2019-02-15 NOTE — Assessment & Plan Note (Signed)
1 day h/o acute flare, short anti inflammatory course prescribed, will call if not improved for x ray and IM injections

## 2019-02-15 NOTE — Patient Instructions (Signed)
F/U as before, call if you need me sooner  Three medications are prescribed , as we discussed for acte mid back pain, if not much better in the next 2 days call so I can order an updated X ray of your mid back and also bring you in for injections  Thanks for choosing [facility], we consider it a privelige to serve you.   Social distancing.Keep 6 ft away from people yo do not live with Frequent hand washing with soap and water Keeping your hands off of your face.wear a face mask These 3 practices will help to keep both you and your community healthy during this time. Please practice them faithfully!

## 2019-02-15 NOTE — Progress Notes (Signed)
Virtual Visit via Telephone Note  I connected with Jillian [name] on 02/15/19 at  2:00 PM EDT by telephone and verified that I am speaking with the correct person using two identifiers.  Location: Patient: home Provider: office   I discussed the limitations, risks, security and privacy concerns of performing an evaluation and management service by telephone and the availability of in person appointments. I also discussed with the patient that there may be a patient responsible charge related to this service. The patient expressed understanding and agreed to proceed. This visit type is conducted due to national recommendations for restrictions regarding the COVID -19 Pandemic. Due to the patient's age and / or co morbidities, this format is felt to be most appropriate at this time without adequate follow up. The patient has no access to video technology/ had technical difficulties with video, requiring transitioning to audio format  only ( telephone ). All issues noted this document were discussed and addressed,no physical exam can be performed in this format.   History of Present Illness: Woke up with acute mid back pain , rated at a 7 to 8 Denies any new lower extremity weakness, numbness,or incontinence, has estblished lumbar disease. No known trigger / inciting incident for this episode Denies recent fever or chills. Denies sinus pressure, nasal congestion, ear pain or sore throat. Denies chest congestion, productive cough or wheezing. Denies chest pains, palpitations and leg swelling Denies abdominal pain, nausea, vomiting,diarrhea or constipation.   Denies dysuria, frequency, hesitancy or incontinence. . Denies skin break down or rash.       Observations/Objective: BP 132/68   Ht 5\' 4"  (1.626 m)   Wt 181 lb (82.1 kg)   BMI 31.07 kg/m  Good communication with no confusion and intact memory. Alert and oriented x 3 No signs of respiratory distress during  sppech    Assessment and Plan: Thoracic spine pain 1 day h/o acute flare, short anti inflammatory course prescribed, will call if not improved for x ray and IM injections  Educated About Covid-19 Virus Infection Covid-19 Education  The signs and symptoms of of COVID -19 were discussed with the patient and how to seek care for testing. ( follow up with PCP or arrange  E-visit) The importance of social  distancing is discussed today.   ESSENTIAL HYPERTENSION, BENIGN Controlled, no change in medication DASH diet and commitment to daily physical activity for a minimum of 30 minutes discussed and encouraged, as a part of hypertension management. The importance of attaining a healthy weight is also discussed.  BP/Weight 02/15/2019 02/07/2019 02/03/2019 02/01/2019 12/31/2018 03/31/6268 12/28/5460  Systolic BP 703 500 938 182 [phone]  Diastolic BP 68 68 79 79 78 80 80  Wt. (Lbs) 181 181.6 179 176 176 176 177  BMI 31.07 31.17 30.73 29.29 29.29 29.29 29.45         Follow Up Instructions:    I discussed the assessment and treatment plan with the patient. The patient was provided an opportunity to ask questions and all were answered. The patient agreed with the plan and demonstrated an understanding of the instructions.   The patient was advised to call back or seek an in-person evaluation if the symptoms worsen or if the condition fails to improve as anticipated.  I provided 15 minutes of non-face-to-face time during this encounter.   Tula Nakayama, MD

## 2019-02-17 ENCOUNTER — Ambulatory Visit (HOSPITAL_COMMUNITY): Payer: Medicare HMO | Admitting: Hematology

## 2019-02-20 ENCOUNTER — Encounter: Payer: Self-pay | Admitting: Family Medicine

## 2019-02-20 NOTE — Assessment & Plan Note (Signed)
Controlled, no change in medication DASH diet and commitment to daily physical activity for a minimum of 30 minutes discussed and encouraged, as a part of hypertension management. The importance of attaining a healthy weight is also discussed.  BP/Weight 02/15/2019 02/07/2019 02/03/2019 02/01/2019 12/31/2018 8/83/2549 04/23/6414  Systolic BP 830 940 768 088 110 315 945  Diastolic BP 68 68 79 79 78 80 80  Wt. (Lbs) 181 181.6 179 176 176 176 177  BMI 31.07 31.17 30.73 29.29 29.29 29.29 29.45

## 2019-02-20 NOTE — Assessment & Plan Note (Signed)
Covid-19 Education  The signs and symptoms of of COVID -19 were discussed with the patient and how to seek care for testing. ( follow up with PCP or arrange  E-visit) The importance of social  distancing is discussed today.  

## 2019-03-15 ENCOUNTER — Ambulatory Visit (INDEPENDENT_AMBULATORY_CARE_PROVIDER_SITE_OTHER): Payer: Medicare HMO | Admitting: Family Medicine

## 2019-03-15 ENCOUNTER — Other Ambulatory Visit: Payer: Self-pay

## 2019-03-15 ENCOUNTER — Encounter: Payer: Self-pay | Admitting: Family Medicine

## 2019-03-15 VITALS — BP 132/85 | Ht 64.0 in | Wt 181.0 lb

## 2019-03-15 DIAGNOSIS — E1159 Type 2 diabetes mellitus with other circulatory complications: Secondary | ICD-10-CM

## 2019-03-15 DIAGNOSIS — E785 Hyperlipidemia, unspecified: Secondary | ICD-10-CM

## 2019-03-15 DIAGNOSIS — G44221 Chronic tension-type headache, intractable: Secondary | ICD-10-CM

## 2019-03-15 DIAGNOSIS — I10 Essential (primary) hypertension: Secondary | ICD-10-CM | POA: Diagnosis not present

## 2019-03-15 DIAGNOSIS — G4700 Insomnia, unspecified: Secondary | ICD-10-CM | POA: Diagnosis not present

## 2019-03-15 MED ORDER — MIRTAZAPINE 7.5 MG PO TABS: 7.5000 mg | ORAL_TABLET | Freq: Every day | ORAL | 2 refills | Status: DC

## 2019-03-15 NOTE — Patient Instructions (Addendum)
F/U with MD in 6 to 8 weeks in office visit, re evaluate poor sleep and  headache  Improved sleep will reduce nagging pain in head  New medication for sleep Remeron take at around 9: 30 and get into bed at that time Please try to stay out of bed, before 9 , watch TV in your living room or den  No lights or sound  In your bedroom at sleep time except a night light for safety getting to the bathroom  Please get non fasting HBA1C, cmp and EGFr 1 week before next visit  Thanks for choosing [facility], we consider it a privelige to serve you.

## 2019-03-15 NOTE — Progress Notes (Signed)
Virtual Visit via Telephone Note  I connected with Jillian [name] on 03/15/19 at  3:40 PM EDT by telephone and verified that I am speaking with the correct person using two identifiers.  Location: Patient: home Provider: office   I discussed the limitations, risks, security and privacy concerns of performing an evaluation and management service by telephone and the availability of in person appointments. I also discussed with the patient that there may be a patient responsible charge related to this service. The patient expressed understanding and agreed to proceed. I connected with  Jillian [name] on 03/15/19 by a video enabled telemedicine application and verified that I am speaking with the correct person using two identifiers.   I discussed the limitations of evaluation and management by telemedicine. The patient expressed understanding and agreed to proceed.     History of Present Illness: 3 week h/o nagging pain across forehead and now just over right eye rated at a 2, nagging pain was as much as an 8, pain did not awaken, no vision changes .took tylenol. No sinus symptoms No fever, chills or stiff neck Poor sleep, which is chronic  Denies recent fever or chills. Denies sinus pressure, nasal congestion, ear pain or sore throat. Denies chest congestion, productive cough or wheezing. Denies chest pains, palpitations and leg swelling Denies abdominal pain, nausea, vomiting,diarrhea or constipation.   Denies dysuria, frequency, hesitancy or incontinence. Denies joint pain, swelling and limitation in mobility. Denies  seizures, numbness, or tingling.  Denies skin break down or rash.     Observations/Objective: BP 132/85   Ht 5\' 4"  (1.626 m)   Wt 181 lb (82.1 kg)   BMI 31.07 kg/m  Good communication with no confusion and intact memory. Alert and oriented x 3 No signs of respiratory distress during speech    Assessment and Plan: INSOMNIA, CHRONIC Sleep hygiene  reviewed and written information offered also. Prescription sent for  medication needed.   Chronic headache 3 week frontal tension type headache associated with poor sleep  ESSENTIAL HYPERTENSION, BENIGN Controlled, no change in medication DASH diet and commitment to daily physical activity for a minimum of 30 minutes discussed and encouraged, as a part of hypertension management. The importance of attaining a healthy weight is also discussed.  BP/Weight 03/15/2019 02/15/2019 02/07/2019 02/03/2019 02/01/2019 12/31/2018 9/76/7341  Systolic BP 937 902 409 735 329 924 268  Diastolic BP 85 68 68 79 79 78 80  Wt. (Lbs) 181 181 181.6 179 176 176 176  BMI 31.07 31.07 31.17 30.73 29.29 29.29 29.29         Follow Up Instructions:    I discussed the assessment and treatment plan with the patient. The patient was provided an opportunity to ask questions and all were answered. The patient agreed with the plan and demonstrated an understanding of the instructions.   The patient was advised to call back or seek an in-person evaluation if the symptoms worsen or if the condition fails to improve as anticipated.  I provided 22 minutes of non-face-to-face time during this encounter.   Tula Nakayama, MD

## 2019-04-03 DIAGNOSIS — R519 Headache, unspecified: Secondary | ICD-10-CM | POA: Insufficient documentation

## 2019-04-03 NOTE — Assessment & Plan Note (Signed)
Controlled, no change in medication DASH diet and commitment to daily physical activity for a minimum of 30 minutes discussed and encouraged, as a part of hypertension management. The importance of attaining a healthy weight is also discussed.  BP/Weight 03/15/2019 02/15/2019 02/07/2019 02/03/2019 02/01/2019 12/31/2018 05/11/8137  Systolic BP 871 959 747 185 501 586 825  Diastolic BP 85 68 68 79 79 78 80  Wt. (Lbs) 181 181 181.6 179 176 176 176  BMI 31.07 31.07 31.17 30.73 29.29 29.29 29.29

## 2019-04-03 NOTE — Assessment & Plan Note (Signed)
3 week frontal tension type headache associated with poor sleep

## 2019-04-03 NOTE — Assessment & Plan Note (Signed)
Sleep hygiene reviewed and written information offered also. Prescription sent for  medication needed.  

## 2019-04-03 NOTE — Assessment & Plan Note (Signed)
Controlled, no change in medication  

## 2019-04-16 ENCOUNTER — Encounter (HOSPITAL_COMMUNITY): Payer: Self-pay

## 2019-04-16 ENCOUNTER — Emergency Department (HOSPITAL_COMMUNITY): Payer: Medicare HMO

## 2019-04-16 ENCOUNTER — Emergency Department (HOSPITAL_COMMUNITY)
Admission: EM | Admit: 2019-04-16 | Discharge: 2019-04-16 | Disposition: A | Discharge status: Home / Self Care | Payer: Medicare HMO | Attending: Emergency Medicine | Admitting: Emergency Medicine

## 2019-04-16 ENCOUNTER — Other Ambulatory Visit: Payer: Self-pay

## 2019-04-16 DIAGNOSIS — I10 Essential (primary) hypertension: Secondary | ICD-10-CM | POA: Insufficient documentation

## 2019-04-16 DIAGNOSIS — Z79899 Other long term (current) drug therapy: Secondary | ICD-10-CM | POA: Insufficient documentation

## 2019-04-16 DIAGNOSIS — Z885 Allergy status to narcotic agent status: Secondary | ICD-10-CM | POA: Diagnosis not present

## 2019-04-16 DIAGNOSIS — Z7984 Long term (current) use of oral hypoglycemic drugs: Secondary | ICD-10-CM | POA: Insufficient documentation

## 2019-04-16 DIAGNOSIS — M25512 Pain in left shoulder: Secondary | ICD-10-CM | POA: Diagnosis not present

## 2019-04-16 DIAGNOSIS — E119 Type 2 diabetes mellitus without complications: Secondary | ICD-10-CM | POA: Diagnosis not present

## 2019-04-16 DIAGNOSIS — M5134 Other intervertebral disc degeneration, thoracic region: Secondary | ICD-10-CM | POA: Diagnosis not present

## 2019-04-16 DIAGNOSIS — M546 Pain in thoracic spine: Secondary | ICD-10-CM | POA: Diagnosis not present

## 2019-04-16 DIAGNOSIS — Z888 Allergy status to other drugs, medicaments and biological substances status: Secondary | ICD-10-CM | POA: Insufficient documentation

## 2019-04-16 LAB — COMPREHENSIVE METABOLIC PANEL
ALT: 13 U/L (ref 0–44)
AST: 19 U/L (ref 15–41)
Albumin: 3.3 g/dL — ABNORMAL LOW (ref 3.5–5.0)
Alkaline Phosphatase: 45 U/L (ref 38–126)
Anion gap: 8 (ref 5–15)
BUN: 12 mg/dL (ref 8–23)
CO2: 23 mmol/L (ref 22–32)
Calcium: 8.9 mg/dL (ref 8.9–10.3)
Chloride: 112 mmol/L — ABNORMAL HIGH (ref 98–111)
Creatinine, Ser: 0.96 mg/dL (ref 0.44–1.00)
GFR calc Af Amer: >60 mL/min (ref >60)
GFR calc non Af Amer: 57 mL/min — ABNORMAL LOW (ref >60)
Glucose, Bld: 114 mg/dL — ABNORMAL HIGH (ref 70–99)
Potassium: 4.1 mmol/L (ref 3.5–5.1)
Sodium: 143 mmol/L (ref 135–145)
Total Bilirubin: 0.4 mg/dL (ref 0.3–1.2)
Total Protein: 6.4 g/dL — ABNORMAL LOW (ref 6.5–8.1)

## 2019-04-16 LAB — CBC WITH DIFFERENTIAL/PLATELET
Abs Immature Granulocytes: 0.05 10*3/uL (ref 0.00–0.07)
Basophils Absolute: 0 10*3/uL (ref 0.0–0.1)
Basophils Relative: 0 %
Eosinophils Absolute: 0.1 10*3/uL (ref 0.0–0.5)
Eosinophils Relative: 1 %
HCT: 36 % (ref 36.0–46.0)
Hemoglobin: 10.9 g/dL — ABNORMAL LOW (ref 12.0–15.0)
Immature Granulocytes: 1 %
Lymphocytes Relative: 42 %
Lymphs Abs: 2.7 10*3/uL (ref 0.7–4.0)
MCH: 30.2 pg (ref 26.0–34.0)
MCHC: 30.3 g/dL (ref 30.0–36.0)
MCV: 99.7 fL (ref 80.0–100.0)
Monocytes Absolute: 0.5 10*3/uL (ref 0.1–1.0)
Monocytes Relative: 8 %
Neutro Abs: 3.2 10*3/uL (ref 1.7–7.7)
Neutrophils Relative %: 48 %
Platelets: 176 10*3/uL (ref 150–400)
RBC: 3.61 MIL/uL — ABNORMAL LOW (ref 3.87–5.11)
RDW: 13.4 % (ref 11.5–15.5)
WBC: 6.6 10*3/uL (ref 4.0–10.5)
nRBC: 0 % (ref 0.0–0.2)

## 2019-04-16 LAB — LIPASE, BLOOD: Lipase: 21 U/L (ref 11–51)

## 2019-04-16 LAB — TROPONIN I (HIGH SENSITIVITY)
Troponin I (High Sensitivity): 3 ng/L (ref <18)
Troponin I (High Sensitivity): 3 ng/L (ref <18)

## 2019-04-16 MED ORDER — HYDROCODONE-ACETAMINOPHEN 5-325 MG PO TABS: 1.0000 | ORAL_TABLET | Freq: Four times a day (QID) | ORAL | 0 refills | Status: DC | PRN

## 2019-04-16 NOTE — ED Notes (Signed)
Patient transported to X-ray 

## 2019-04-16 NOTE — ED Triage Notes (Signed)
Pt reports pain to left scapular area since midnight.  Pt denies injury or strain, denies other complaints

## 2019-04-16 NOTE — ED Provider Notes (Signed)
[facility] EMERGENCY DEPARTMENT Provider Note   CSN: [id number] Arrival date & time: 04/16/19  0340    History   Chief Complaint Chief Complaint  Patient presents with  . Back Pain    HPI Jillian [name] is a 78 y.o. female.     Patient presents to the emergency department for evaluation of back pain.  Patient reports that she has been bothered by back pain for the last several days but tonight around midnight she had a sudden onset of a sharp stabbing pain in the area of the left shoulder blade.  When this pain came on it was severe, 10 out of 10.  She reports that this pain has eased off now and currently is not present.  She did not have any associated shortness of breath.  She has not had any chest pain.  She denies any recent falls or injury to the back.  No radiation of pain to extremities.     Past Medical History:  Diagnosis Date  . Chronic fatigue   . Diabetes mellitus 2011  . DJD (degenerative joint disease)    of neck    . Dyslipidemia   . 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 Hypertension 2005  . Mild obesity   . Postmenopausal   . Viral meningitis   . Wears glasses     Patient Active Problem List   Diagnosis Date Noted  . Chronic headache 04/03/2019  . Educated About Covid-19 Virus Infection 02/06/2019  . Overweight (BMI 25.0-29.9) 02/06/2019  . Thoracic back pain 11/08/2018  . Gingivitis 11/05/2018  . 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 Back pain with right-sided radiculopathy 04/28/2018  . Longitudinal split nail 02/15/2018  . Chronic left SI joint pain 05/21/2016  . Primary osteoarthritis of both knees 06/26/2015  . 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 Thoracic spine pain 09/28/2014  . Hip pain 08/06/2012  . Chest wall pain 12/17/2011  . 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 Hot flashes, menopausal 09/10/2011  . Type 2 diabetes mellitus with vascular disease  ([place]) 01/08/2010  . INSOMNIA, CHRONIC 11/28/2009  . Hyperlipidemia with target LDL less than 100 10/12/2007  . ESSENTIAL HYPERTENSION, BENIGN 10/12/2007    Past Surgical History:  Procedure Laterality Date  . ABDOMINAL HYSTERECTOMY  1992   fibroids  . APPENDECTOMY    . BREAST EXCISIONAL BIOPSY Right    benign  . BREAST LUMPECTOMY WITH RADIOACTIVE SEED LOCALIZATION Right 10/05/2014   Procedure: BREAST LUMPECTOMY WITH RADIOACTIVE SEED LOCALIZATION;  Surgeon: Erroll Luna, MD;  Location: [place];  Service: General;  Laterality: Right;  . cataract Bilateral   . CYST EXCISION Right    arm  . EYE SURGERY Right 06/07/2013   cataract  . EYE SURGERY Left 09/14/2013   cataract  . KNEE ARTHROSCOPY Right 02/26/2016   Procedure: ARTHROSCOPY RIGHT KNEE WITH MENSICAL DEBRIDEMENT;  Surgeon: Gaynelle Arabian, MD;  Location: [facility];  Service: Orthopedics;  Laterality: Right;  . left breast biopsy    . LESION REMOVAL Right 03/30/2013   Procedure: EXCISION OF NEOPLASM ARM;  Surgeon: Jamesetta So, MD;  Location: [facility];  Service: General;  Laterality: Right;  . TUBAL LIGATION       OB History    Gravida  2   Para  2   Term  2   Preterm      AB  Living        SAB      TAB      Ectopic      Multiple      Live Births               Home Medications    Prior to Admission medications   Medication Sig Start Date End Date Taking? Authorizing Provider  acetaminophen (TYLENOL) 500 MG tablet Take 1,000 mg by mouth every 6 (six) hours as needed for mild pain.    [provider]  aspirin EC 81 MG tablet Take 1 tablet (81 mg total) by mouth every evening. Patient taking differently: Take 81 mg by mouth every morning.  04/23/16   Fayrene Helper, MD  calcium-vitamin D (OSCAL WITH D) 500-200 MG-UNIT tablet Take 1 tablet by mouth.    [provider]  cloNIDine (CATAPRES) 0.1 MG tablet Take one tablet once daily at bedtime Patient taking  differently: daily. Take one tablet once daily at bedtime 06/23/18   Fayrene Helper, MD  ibuprofen (ADVIL) 800 MG tablet Take one tablet by mouth, three times daily for 5 days, then as needed 02/15/19   Fayrene Helper, MD  metFORMIN (GLUCOPHAGE-XR) 500 MG 24 hr tablet Take 1 tablet (500 mg total) by mouth 2 (two) times daily. 01/04/19   Fayrene Helper, MD  mirtazapine (REMERON) 7.5 MG tablet Take 1 tablet (7.5 mg total) by mouth at bedtime. 03/15/19   Fayrene Helper, MD  ondansetron (ZOFRAN) 4 MG tablet Take 1 tablet (4 mg total) by mouth every 8 (eight) hours as needed for nausea or vomiting. 01/06/19   Perlie Mayo, NP  pantoprazole (PROTONIX) 40 MG tablet Take 1 tablet (40 mg total) by mouth daily. 02/15/19   Fayrene Helper, MD  pravastatin (PRAVACHOL) 10 MG tablet Take 10 mg by mouth daily. 11/10/18   [provider]  tamoxifen (NOLVADEX) 20 MG tablet Take 1 tablet (20 mg total) by mouth daily. 04/30/18   Higgs, Mathis Dad, MD    Family History Family History  Problem Relation Age of Onset  . Hypertension Father   . Congestive Heart Failure Father   . Liver cancer Mother   . Hypertension Sister   . Hypertension Brother   . Diabetes Brother     Social History Social History   Tobacco Use  . Smoking status: Never Smoker  . Smokeless tobacco: Never Used  Substance Use Topics  . Alcohol use: No  . Drug use: No     Allergies   Codeine and Propoxyphene n-acetaminophen   Review of Systems Review of Systems  Musculoskeletal: Positive for back pain.     Physical Exam Updated Vital Signs BP (!) 168/91   Pulse 88   Temp 98.2 F (36.8 C) (Oral)   Resp 19   Ht 5\' 4"  (1.626 m)   Wt 80.7 kg   SpO2 98%   BMI 30.55 kg/m   Physical Exam Vitals signs and nursing note reviewed.  Constitutional:      General: She is not in acute distress.    Appearance: Normal appearance. She is well-developed.  HENT:     Head: Normocephalic and atraumatic.      Right Ear: Hearing normal.     Left Ear: Hearing normal.     Nose: Nose normal.  Eyes:     Conjunctiva/sclera: Conjunctivae normal.     Pupils: Pupils are equal, round, and reactive to light.  Neck:  Musculoskeletal: Normal range of motion and neck supple.  Cardiovascular:     Rate and Rhythm: Regular rhythm.     Heart sounds: S1 normal and S2 normal. No murmur. No friction rub. No gallop.   Pulmonary:     Effort: Pulmonary effort is normal. No respiratory distress.     Breath sounds: Normal breath sounds.  Chest:     Chest wall: No tenderness.  Abdominal:     General: Bowel sounds are normal.     Palpations: Abdomen is soft.     Tenderness: There is no abdominal tenderness. There is no guarding or rebound. Negative signs include Murphy's sign and McBurney's sign.     Hernia: No hernia is present.  Musculoskeletal: Normal range of motion.     Thoracic back: She exhibits tenderness.       Back:  Skin:    General: Skin is warm and dry.     Findings: No rash.  Neurological:     Mental Status: She is alert and oriented to person, place, and time.     GCS: GCS eye subscore is 4. GCS verbal subscore is 5. GCS motor subscore is 6.     Cranial Nerves: No cranial nerve deficit.     Sensory: No sensory deficit.     Coordination: Coordination normal.  Psychiatric:        Speech: Speech normal.        Behavior: Behavior normal.        Thought Content: Thought content normal.      ED Treatments / Results  Labs (all labs ordered are listed, but only abnormal results are displayed) Labs Reviewed  CBC WITH DIFFERENTIAL/PLATELET - Abnormal; Notable for the following components:      Result Value   RBC 3.61 (*)    Hemoglobin 10.9 (*)    All other components within normal limits  COMPREHENSIVE METABOLIC PANEL - Abnormal; Notable for the following components:   Chloride 112 (*)    Glucose, Bld 114 (*)    Total Protein 6.4 (*)    Albumin 3.3 (*)    GFR calc non Af Amer 57 (*)     All other components within normal limits  LIPASE, BLOOD  TROPONIN I (HIGH SENSITIVITY)  TROPONIN I (HIGH SENSITIVITY)    EKG EKG Interpretation  Date/Time:  Saturday April 16 2019 04:02:04 EDT Ventricular Rate:  77 PR Interval:    QRS Duration: 80 QT Interval:  378 QTC Calculation: 428 R Axis:   4 Text Interpretation:  Sinus rhythm Abnormal R-wave progression, early transition No significant change since last tracing Confirmed by Orpah Greek [phone]) on 04/16/2019 4:04:08 AM   Radiology Dg Chest 2 View  Result Date: 04/16/2019 CLINICAL DATA:  78 year old female with history of pain in the left scapular area since midnight. EXAM: CHEST - 2 VIEW COMPARISON:  Chest x-ray 12/31/2018. FINDINGS: Lung volumes are normal. No consolidative airspace disease. No pleural effusions. No pneumothorax. No pulmonary nodule or mass noted. Pulmonary vasculature and the cardiomediastinal silhouette are within normal limits. IMPRESSION: No radiographic evidence of acute cardiopulmonary disease. Electronically Signed   By: Vinnie Langton M.D.   On: 04/16/2019 04:57   Dg Thoracic Spine 2 View  Result Date: 04/16/2019 CLINICAL DATA:  78 year old female with history of pain in the left scapular area since midnight. EXAM: THORACIC SPINE 2 VIEWS COMPARISON:  No priors. FINDINGS: There is no evidence of thoracic spine fracture. Alignment is normal. Multilevel degenerative disc disease noted throughout the  thoracic spine, most severe in the midthoracic spine. No other significant bone abnormalities are identified. IMPRESSION: 1. 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 Electronically Signed   By: Vinnie Langton M.D.   On: 04/16/2019 04:58    Procedures Procedures (including critical care time)  Medications Ordered in ED Medications - No data to display   Initial Impression / Assessment and Plan / ED Course  I have reviewed the triage vital signs  and the nursing notes.  Pertinent labs & imaging results that were available during my care of the patient were reviewed by me and considered in my medical decision making (see chart for details).        Patient presents to the emergency department for evaluation of back pain.  Patient reports that she has been having mild back pain for several weeks but tonight had sudden onset of a severe and sharp pain under the left shoulder blade.  Pain lasted for an hour or so and then eased off.  At arrival to the ER she is not feeling the pain continuously but still has some spasm and tenderness of the paraspinal muscles present.  This pain is therefore reproducible.  She does not have any pulmonary symptoms, denies shortness of breath, no pleuritic pain.  She is not hypoxic or tachycardic.  Chest x-ray unremarkable.  Thoracic x-ray shows multilevel degenerative disc disease but no acute findings.  No evidence of compression fracture.  Cardiac evaluation negative.  EKG unremarkable, first troponin negative.  Will obtain second troponin, if negative, discharged with analgesia and PCP follow-up.  Final Clinical Impressions(s) / ED Diagnoses   Final diagnoses:  Acute left-sided thoracic back pain    ED Discharge Orders    None       Orpah Greek, MD 04/16/19 [phone]

## 2019-04-18 ENCOUNTER — Other Ambulatory Visit: Payer: Self-pay

## 2019-04-18 ENCOUNTER — Ambulatory Visit (INDEPENDENT_AMBULATORY_CARE_PROVIDER_SITE_OTHER): Payer: Medicare HMO | Admitting: Family Medicine

## 2019-04-18 ENCOUNTER — Encounter: Payer: Self-pay | Admitting: Family Medicine

## 2019-04-18 VITALS — BP 140/88 | HR 82 | Resp 12 | Ht 64.0 in | Wt 186.1 lb

## 2019-04-18 DIAGNOSIS — M546 Pain in thoracic spine: Secondary | ICD-10-CM

## 2019-04-18 DIAGNOSIS — I10 Essential (primary) hypertension: Secondary | ICD-10-CM | POA: Diagnosis not present

## 2019-04-18 DIAGNOSIS — E559 Vitamin D deficiency, unspecified: Secondary | ICD-10-CM

## 2019-04-18 DIAGNOSIS — E1159 Type 2 diabetes mellitus with other circulatory complications: Secondary | ICD-10-CM

## 2019-04-18 DIAGNOSIS — E785 Hyperlipidemia, unspecified: Secondary | ICD-10-CM

## 2019-04-18 DIAGNOSIS — Z09 Encounter for follow-up examination after completed treatment for conditions other than malignant neoplasm: Secondary | ICD-10-CM | POA: Diagnosis not present

## 2019-04-18 MED ORDER — IBUPROFEN 800 MG PO TABS: 800.0000 mg | ORAL_TABLET | Freq: Three times a day (TID) | ORAL | 0 refills | Status: DC | PRN

## 2019-04-18 MED ORDER — CLONIDINE HCL 0.2 MG PO TABS: ORAL_TABLET | ORAL | 3 refills | Status: DC

## 2019-04-18 MED ORDER — CYCLOBENZAPRINE HCL 10 MG PO TABS: ORAL_TABLET | ORAL | 0 refills | Status: DC

## 2019-04-18 NOTE — Progress Notes (Signed)
.  Jillian [name]     MRN: [id number]      DOB: 1941-05-30   HPI Jillian [name] is here for follow up of recent eD visit this past weekend , states she was awakened by severe left posterior chest pain , cannot identifying any underlying recent triggers for the pain. The PT denies any adverse reactions to current medications since the last visit.  There are no new concerns.  There are no specific complaints   ROS Denies recent fever or chills. Denies sinus pressure, nasal congestion, ear pain or sore throat. Denies chest congestion, productive cough or wheezing. Denies chest pains, palpitations and leg swelling Denies abdominal pain, nausea, vomiting,diarrhea or constipation.   Denies dysuria, frequency, hesitancy or incontinence. . Denies depression, anxiety or insomnia. Denies skin break down or rash.   PE  BP 140/88   Pulse 82   Resp 12   Ht 5\' 4"  (1.626 m)   Wt 186 lb 1.9 oz (84.4 kg)   SpO2 98%   BMI 31.95 kg/m   Patient alert and oriented and in no cardiopulmonary distress.Pt in pain  HEENT: No facial asymmetry, EOMI,   oropharynx pink and moist.  Neck supple no JVD, no mass.  Chest: Clear to auscultation bilaterally.  CVS: S1, S2 no murmurs, no S3.Regular rate.  ABD: Soft non tender.   Ext: No edema  CW:CBJSEGBTD  ROM thoracic  spine, adequate in shoulders, hips and knees.  Skin: Intact, no ulcerations or rash noted.  Psych: Good eye contact, normal affect. Memory intact not anxious or depressed appearing.  CNS: CN 2-12 intact, power,  normal throughout.no focal deficits noted.   Assessment & Plan  Encounter for examination following treatment at hospital Ed course, dx and management reviewed with pt. States has not taken hydrocodone, and gets better relief with anti inflammatory and muscle relaxant, both are prescribed for limited short term use for acute flares  ESSENTIAL HYPERTENSION, BENIGN UnControlled, no change in medication, re evaluate at next  visit, has been controlled , pt in pain DASH diet and commitment to daily physical activity for a minimum of 30 minutes discussed and encouraged, as a part of hypertension management. The importance of attaining a healthy weight is also discussed.  BP/Weight 04/18/2019 04/16/2019 03/15/2019 02/15/2019 02/07/2019 02/03/2019 1/76/1607  Systolic BP 371 062 694 854 627 035 009  Diastolic BP 88 91 85 68 68 79 79  Wt. (Lbs) 186.12 178 181 181 181.6 179 176  BMI 31.95 30.55 31.07 31.07 31.17 30.73 29.29       Thoracic back pain Uncontrolled, short cours eibuprofen and flexeril  Hyperlipidemia with target LDL less than 100 Hyperlipidemia:Low fat diet discussed and encouraged.   Lipid Panel  Lab Results  Component Value Date   CHOL 138 11/08/2018   HDL 52 11/08/2018   LDLCALC 67 11/08/2018   TRIG 111 11/08/2018   CHOLHDL 2.7 11/08/2018   Controlled, no change in medication

## 2019-04-18 NOTE — Patient Instructions (Addendum)
Please cancel August appointment.  Follow-up in October as before.  You are treated for acute muscle strain and back spasm.  Ibuprofen and Flexeril are represcribed locally.  Blood pressure is elevated so clonidine is increased clonidine  0.2 mg one  at bedtime.  Until you receive this from your mail order start taking two 0.1 mg clonidine tablets at bedtime clonidine at bedtime for uncontrolled blood pressure.  Thanks for choosing [facility], we consider it a privelige to serve you.

## 2019-04-24 ENCOUNTER — Encounter: Payer: Self-pay | Admitting: Family Medicine

## 2019-04-24 NOTE — Assessment & Plan Note (Signed)
Hyperlipidemia:Low fat diet discussed and encouraged.   Lipid Panel  Lab Results  Component Value Date   CHOL 138 11/08/2018   HDL 52 11/08/2018   LDLCALC 67 11/08/2018   TRIG 111 11/08/2018   CHOLHDL 2.7 11/08/2018   Controlled, no change in medication

## 2019-04-24 NOTE — Assessment & Plan Note (Signed)
Ed course, dx and management reviewed with pt. States has not taken hydrocodone, and gets better relief with anti inflammatory and muscle relaxant, both are prescribed for limited short term use for acute flares

## 2019-04-24 NOTE — Assessment & Plan Note (Signed)
Uncontrolled, short cours eibuprofen and flexeril

## 2019-04-24 NOTE — Assessment & Plan Note (Signed)
UnControlled, no change in medication, re evaluate at next visit, has been controlled , pt in pain DASH diet and commitment to daily physical activity for a minimum of 30 minutes discussed and encouraged, as a part of hypertension management. The importance of attaining a healthy weight is also discussed.  BP/Weight 04/18/2019 04/16/2019 03/15/2019 02/15/2019 02/07/2019 02/03/2019 2/76/7011  Systolic BP 003 496 116 435 391 225 834  Diastolic BP 88 91 85 68 68 79 79  Wt. (Lbs) 186.12 178 181 181 181.6 179 176  BMI 31.95 30.55 31.07 31.07 31.17 30.73 29.29

## 2019-04-27 ENCOUNTER — Ambulatory Visit: Payer: Medicare HMO | Admitting: Family Medicine

## 2019-05-11 ENCOUNTER — Other Ambulatory Visit: Payer: Self-pay | Admitting: Family Medicine

## 2019-05-18 ENCOUNTER — Other Ambulatory Visit: Payer: Self-pay

## 2019-05-18 ENCOUNTER — Ambulatory Visit (INDEPENDENT_AMBULATORY_CARE_PROVIDER_SITE_OTHER): Payer: Medicare HMO

## 2019-05-18 DIAGNOSIS — Z23 Encounter for immunization: Secondary | ICD-10-CM

## 2019-05-23 ENCOUNTER — Other Ambulatory Visit: Payer: Self-pay | Admitting: Family Medicine

## 2019-05-25 ENCOUNTER — Telehealth: Payer: Self-pay | Admitting: *Deleted

## 2019-05-25 NOTE — Telephone Encounter (Signed)
Called patients son (DPR) and left generic message that medication was already at the pharmacy they were requesting. If they had any further questions or concerns, they could call us back.

## 2019-05-25 NOTE — Telephone Encounter (Signed)
Pts son Delilah Shan called (on [place]) wanted to know if her muscle relaxer could be sent in to [facility] in [place]

## 2019-06-01 ENCOUNTER — Telehealth: Payer: Self-pay | Admitting: Family Medicine

## 2019-06-01 ENCOUNTER — Other Ambulatory Visit: Payer: Self-pay | Admitting: Family Medicine

## 2019-06-01 NOTE — Telephone Encounter (Signed)
Pt is calling in with Back pain, is there something else you can do.Marland Kitchen

## 2019-06-03 ENCOUNTER — Other Ambulatory Visit: Payer: Self-pay | Admitting: Family Medicine

## 2019-06-03 MED ORDER — PREDNISONE 20 MG PO TABS: ORAL_TABLET | ORAL | 0 refills | Status: AC

## 2019-06-03 NOTE — Telephone Encounter (Signed)
Pls let her know that I have prescribed short  course of prednisone, and sorry for delay in response

## 2019-06-03 NOTE — Telephone Encounter (Signed)
Advised patient that medication has been called in for her with verbal understanding.

## 2019-06-12 ENCOUNTER — Emergency Department (HOSPITAL_COMMUNITY)
Admission: EM | Admit: 2019-06-12 | Discharge: 2019-06-12 | Disposition: A | Discharge status: Home / Self Care | Payer: Medicare HMO | Attending: Emergency Medicine | Admitting: Emergency Medicine

## 2019-06-12 ENCOUNTER — Other Ambulatory Visit: Payer: Self-pay

## 2019-06-12 ENCOUNTER — Emergency Department (HOSPITAL_COMMUNITY): Payer: Medicare HMO

## 2019-06-12 ENCOUNTER — Encounter (HOSPITAL_COMMUNITY): Payer: Self-pay | Admitting: Emergency Medicine

## 2019-06-12 DIAGNOSIS — R1030 Lower abdominal pain, unspecified: Secondary | ICD-10-CM | POA: Insufficient documentation

## 2019-06-12 DIAGNOSIS — Z7982 Long term (current) use of aspirin: Secondary | ICD-10-CM | POA: Insufficient documentation

## 2019-06-12 DIAGNOSIS — Z7984 Long term (current) use of oral hypoglycemic drugs: Secondary | ICD-10-CM | POA: Diagnosis not present

## 2019-06-12 DIAGNOSIS — Z03818 Encounter for observation for suspected exposure to other biological agents ruled out: Secondary | ICD-10-CM | POA: Diagnosis not present

## 2019-06-12 DIAGNOSIS — Z20828 Contact with and (suspected) exposure to other viral communicable diseases: Secondary | ICD-10-CM | POA: Diagnosis not present

## 2019-06-12 DIAGNOSIS — I10 Essential (primary) hypertension: Secondary | ICD-10-CM | POA: Diagnosis not present

## 2019-06-12 DIAGNOSIS — E119 Type 2 diabetes mellitus without complications: Secondary | ICD-10-CM | POA: Diagnosis not present

## 2019-06-12 DIAGNOSIS — N281 Cyst of kidney, acquired: Secondary | ICD-10-CM | POA: Diagnosis not present

## 2019-06-12 DIAGNOSIS — K5730 Diverticulosis of large intestine without perforation or abscess without bleeding: Secondary | ICD-10-CM | POA: Diagnosis not present

## 2019-06-12 LAB — URINALYSIS, ROUTINE W REFLEX MICROSCOPIC
Bilirubin Urine: NEGATIVE
Glucose, UA: NEGATIVE mg/dL
Ketones, ur: NEGATIVE mg/dL
Leukocytes,Ua: NEGATIVE
Nitrite: NEGATIVE
Protein, ur: NEGATIVE mg/dL
Specific Gravity, Urine: 1.032 — ABNORMAL HIGH (ref 1.005–1.030)
pH: 6 (ref 5.0–8.0)

## 2019-06-12 LAB — COMPREHENSIVE METABOLIC PANEL
ALT: 13 U/L (ref 0–44)
AST: 15 U/L (ref 15–41)
Albumin: 3.8 g/dL (ref 3.5–5.0)
Alkaline Phosphatase: 56 U/L (ref 38–126)
Anion gap: 9 (ref 5–15)
BUN: 14 mg/dL (ref 8–23)
CO2: 26 mmol/L (ref 22–32)
Calcium: 8.9 mg/dL (ref 8.9–10.3)
Chloride: 105 mmol/L (ref 98–111)
Creatinine, Ser: 1.03 mg/dL — ABNORMAL HIGH (ref 0.44–1.00)
GFR calc Af Amer: >60 mL/min (ref >60)
GFR calc non Af Amer: 53 mL/min — ABNORMAL LOW (ref >60)
Glucose, Bld: 140 mg/dL — ABNORMAL HIGH (ref 70–99)
Potassium: 3.4 mmol/L — ABNORMAL LOW (ref 3.5–5.1)
Sodium: 140 mmol/L (ref 135–145)
Total Bilirubin: 0.8 mg/dL (ref 0.3–1.2)
Total Protein: 7.2 g/dL (ref 6.5–8.1)

## 2019-06-12 LAB — DIFFERENTIAL
Abs Immature Granulocytes: 0.17 10*3/uL — ABNORMAL HIGH (ref 0.00–0.07)
Basophils Absolute: 0 10*3/uL (ref 0.0–0.1)
Basophils Relative: 0 %
Eosinophils Absolute: 0 10*3/uL (ref 0.0–0.5)
Eosinophils Relative: 0 %
Immature Granulocytes: 1 %
Lymphocytes Relative: 20 %
Lymphs Abs: 3 10*3/uL (ref 0.7–4.0)
Monocytes Absolute: 0.9 10*3/uL (ref 0.1–1.0)
Monocytes Relative: 6 %
Neutro Abs: 11.2 10*3/uL — ABNORMAL HIGH (ref 1.7–7.7)
Neutrophils Relative %: 73 %

## 2019-06-12 LAB — CBC
HCT: 44.5 % (ref 36.0–46.0)
Hemoglobin: 13.7 g/dL (ref 12.0–15.0)
MCH: 30.2 pg (ref 26.0–34.0)
MCHC: 30.8 g/dL (ref 30.0–36.0)
MCV: 98.2 fL (ref 80.0–100.0)
Platelets: 177 10*3/uL (ref 150–400)
RBC: 4.53 MIL/uL (ref 3.87–5.11)
RDW: 14.4 % (ref 11.5–15.5)
WBC: 15.5 10*3/uL — ABNORMAL HIGH (ref 4.0–10.5)
nRBC: 0 % (ref 0.0–0.2)

## 2019-06-12 LAB — TYPE AND SCREEN
ABO/RH(D): A POS
Antibody Screen: NEGATIVE

## 2019-06-12 LAB — SARS CORONAVIRUS 2 BY RT PCR (HOSPITAL ORDER, PERFORMED IN ~~LOC~~ HOSPITAL LAB): SARS Coronavirus 2: NEGATIVE

## 2019-06-12 LAB — LIPASE, BLOOD: Lipase: 18 U/L (ref 11–51)

## 2019-06-12 LAB — POC OCCULT BLOOD, ED: Fecal Occult Bld: POSITIVE — AB

## 2019-06-12 MED ORDER — METRONIDAZOLE IN NACL 5-0.79 MG/ML-% IV SOLN: 500.0000 mg | Freq: Once | INTRAVENOUS | Status: DC

## 2019-06-12 MED ORDER — SODIUM CHLORIDE 0.9% FLUSH: 3.0000 mL | Freq: Once | INTRAVENOUS | Status: DC

## 2019-06-12 MED ORDER — IOHEXOL 300 MG/ML  SOLN
100.0000 mL | Freq: Once | INTRAMUSCULAR | Status: AC | PRN
  Administered 2019-06-12: 100 mL via INTRAVENOUS

## 2019-06-12 MED ORDER — ONDANSETRON HCL 4 MG/2ML IJ SOLN
4.0000 mg | Freq: Once | INTRAMUSCULAR | Status: AC
  Administered 2019-06-12: 11:00:00 4 mg via INTRAVENOUS
  Filled 2019-06-12: qty 2

## 2019-06-12 MED ORDER — SODIUM CHLORIDE 0.9 % IV BOLUS
1000.0000 mL | Freq: Once | INTRAVENOUS | Status: AC
  Administered 2019-06-12: 1000 mL via INTRAVENOUS

## 2019-06-12 MED ORDER — CIPROFLOXACIN IN D5W 400 MG/200ML IV SOLN: 400.0000 mg | Freq: Once | INTRAVENOUS | Status: DC

## 2019-06-12 NOTE — ED Triage Notes (Signed)
Patient c/o mid abd pain that started yesterday. Per patient nausea, vomiting, and diarrhea. Denies any fevers or urinary symptoms. Patient reports bright red blood in liquid stool now.

## 2019-06-12 NOTE — ED Notes (Signed)
Pt informed we needed stool sample. Pt verbalized understanding.

## 2019-06-12 NOTE — ED Provider Notes (Signed)
[facility] EMERGENCY DEPARTMENT Provider Note   CSN: [id number] Arrival date & time: 06/12/19  [id number]     History   Chief Complaint Chief Complaint  Patient presents with  . Abdominal Pain    HPI Jillian [name] is a 78 y.o. female.     Patient complains abdominal pain rectal bleeding since yesterday.  No fever no vomiting  The history is provided by the patient. No language interpreter was used.  Abdominal Pain Pain location:  Generalized Pain quality: aching   Pain quality: not burning   Pain radiates to:  Does not radiate Pain severity:  Mild Onset quality:  Sudden Timing:  Constant Progression:  Waxing and waning Chronicity:  New Context: not alcohol use   Relieved by:  Nothing Associated symptoms: no chest pain, no cough, no diarrhea, no fatigue and no hematuria     Past Medical History:  Diagnosis Date  . Chronic fatigue   . Diabetes mellitus 2011  . DJD (degenerative joint disease)    of neck    . Dyslipidemia   . 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 Hypertension 2005  . Mild obesity   . Postmenopausal   . Viral meningitis   . Wears glasses     Patient Active Problem List   Diagnosis Date Noted  . Chronic headache 04/03/2019  . Educated About Covid-19 Virus Infection 02/06/2019  . Overweight (BMI 25.0-29.9) 02/06/2019  . Thoracic back pain 11/08/2018  . 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 Back pain with right-sided radiculopathy 04/28/2018  . Longitudinal split nail 02/15/2018  . Chronic left SI joint pain 05/21/2016  . Encounter for examination following treatment at hospital 04/02/2016  . Primary osteoarthritis of both knees 06/26/2015  . 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 Thoracic spine pain 09/28/2014  . Hip pain 08/06/2012  . Chest wall pain 12/17/2011  . 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 Hot flashes, menopausal 09/10/2011  . Type 2 diabetes  mellitus with vascular disease ([place]) 01/08/2010  . INSOMNIA, CHRONIC 11/28/2009  . Hyperlipidemia with target LDL less than 100 10/12/2007  . ESSENTIAL HYPERTENSION, BENIGN 10/12/2007    Past Surgical History:  Procedure Laterality Date  . ABDOMINAL HYSTERECTOMY  1992   fibroids  . APPENDECTOMY    . BREAST EXCISIONAL BIOPSY Right    benign  . BREAST LUMPECTOMY WITH RADIOACTIVE SEED LOCALIZATION Right 10/05/2014   Procedure: BREAST LUMPECTOMY WITH RADIOACTIVE SEED LOCALIZATION;  Surgeon: Erroll Luna, MD;  Location: [place];  Service: General;  Laterality: Right;  . cataract Bilateral   . CYST EXCISION Right    arm  . EYE SURGERY Right 06/07/2013   cataract  . EYE SURGERY Left 09/14/2013   cataract  . KNEE ARTHROSCOPY Right 02/26/2016   Procedure: ARTHROSCOPY RIGHT KNEE WITH MENSICAL DEBRIDEMENT;  Surgeon: Gaynelle Arabian, MD;  Location: [facility];  Service: Orthopedics;  Laterality: Right;  . left breast biopsy    . LESION REMOVAL Right 03/30/2013   Procedure: EXCISION OF NEOPLASM ARM;  Surgeon: Jamesetta So, MD;  Location: [facility];  Service: General;  Laterality: Right;  . TUBAL LIGATION       OB History    Gravida  2   Para  2   Term  2   Preterm      AB      Living        SAB  TAB      Ectopic      Multiple      Live Births               Home Medications    Prior to Admission medications   Medication Sig Start Date End Date Taking? Authorizing Provider  acetaminophen (TYLENOL) 500 MG tablet Take 1,000 mg by mouth every 6 (six) hours as needed for mild pain.   Yes [provider]  aspirin EC 81 MG tablet Take 1 tablet (81 mg total) by mouth every evening. Patient taking differently: Take 81 mg by mouth every morning.  04/23/16  Yes Fayrene Helper, MD  cloNIDine (CATAPRES) 0.2 MG tablet Take one tablet once daily at bedtime for blood pressure 04/18/19  Yes Fayrene Helper, MD  cyclobenzaprine (FLEXERIL) 10 MG tablet  TAKE 1 TABLET BY MOUTH AT BEDTIME AS NEEDED FOR BACK SPASM 05/23/19  Yes Fayrene Helper, MD  ibuprofen (ADVIL) 800 MG tablet Take 1 tablet (800 mg total) by mouth every 8 (eight) hours as needed. 04/18/19  Yes Fayrene Helper, MD  metFORMIN (GLUCOPHAGE-XR) 500 MG 24 hr tablet TAKE 1 TABLET TWICE DAILY 05/12/19  Yes Fayrene Helper, MD  tamoxifen (NOLVADEX) 20 MG tablet Take 1 tablet (20 mg total) by mouth daily. 04/30/18  Yes Higgs, Mathis Dad, MD  cloNIDine (CATAPRES) 0.1 MG tablet Take one tablet once daily at bedtime Patient not taking: Reported on 06/12/2019 06/23/18   Fayrene Helper, MD  HYDROcodone-acetaminophen (NORCO/VICODIN) 5-325 MG tablet Take 1 tablet by mouth every 6 (six) hours as needed for moderate pain. Patient not taking: Reported on 06/12/2019 04/16/19   Orpah Greek, MD  mirtazapine (REMERON) 7.5 MG tablet Take 1 tablet (7.5 mg total) by mouth at bedtime. Patient not taking: Reported on 06/12/2019 03/15/19   Fayrene Helper, MD  ondansetron (ZOFRAN) 4 MG tablet Take 1 tablet (4 mg total) by mouth every 8 (eight) hours as needed for nausea or vomiting. Patient not taking: Reported on 06/12/2019 01/06/19   Perlie Mayo, NP  pantoprazole (PROTONIX) 40 MG tablet Take 1 tablet (40 mg total) by mouth daily. Patient not taking: Reported on 06/12/2019 02/15/19   Fayrene Helper, MD  pravastatin (PRAVACHOL) 10 MG tablet TAKE 1 TABLET (10 MG TOTAL) BY MOUTH DAILY. Patient not taking: Reported on 06/12/2019 06/02/19   Fayrene Helper, MD    Family History Family History  Problem Relation Age of Onset  . Hypertension Father   . Congestive Heart Failure Father   . Liver cancer Mother   . Hypertension Sister   . Hypertension Brother   . Diabetes Brother     Social History Social History   Tobacco Use  . Smoking status: Never Smoker  . Smokeless tobacco: Never Used  Substance Use Topics  . Alcohol use: No  . Drug use: No     Allergies   Codeine and  Propoxyphene n-acetaminophen   Review of Systems Review of Systems  Constitutional: Negative for appetite change and fatigue.  HENT: Negative for congestion, ear discharge and sinus pressure.   Eyes: Negative for discharge.  Respiratory: Negative for cough.   Cardiovascular: Negative for chest pain.  Gastrointestinal: Positive for abdominal pain. Negative for diarrhea.  Genitourinary: Negative for frequency and hematuria.  Musculoskeletal: Negative for back pain.  Skin: Negative for rash.  Neurological: Negative for seizures and headaches.  Psychiatric/Behavioral: Negative for hallucinations.     Physical Exam Updated Vital Signs BP Marland Kitchen)  158/81   Pulse 87   Temp 98.1 F (36.7 C) (Oral)   Resp 18   Ht 5\' 4"  (1.626 m)   Wt 79.8 kg   SpO2 98%   BMI 30.21 kg/m   Physical Exam Vitals signs and nursing note reviewed.  Constitutional:      Appearance: She is well-developed.  HENT:     Head: Normocephalic.     Nose: Nose normal.  Eyes:     General: No scleral icterus.    Conjunctiva/sclera: Conjunctivae normal.  Neck:     Musculoskeletal: Neck supple.     Thyroid: No thyromegaly.  Cardiovascular:     Rate and Rhythm: Normal rate and regular rhythm.     Heart sounds: No murmur. No friction rub. No gallop.   Pulmonary:     Breath sounds: No stridor. No wheezing or rales.  Chest:     Chest wall: No tenderness.  Abdominal:     General: There is no distension.     Tenderness: There is abdominal tenderness. There is no rebound.  Genitourinary:    Comments: Rectal hem pos Musculoskeletal: Normal range of motion.  Lymphadenopathy:     Cervical: No cervical adenopathy.  Skin:    Findings: No erythema or rash.  Neurological:     Mental Status: She is oriented to person, place, and time.     Motor: No abnormal muscle tone.     Coordination: Coordination normal.  Psychiatric:        Behavior: Behavior normal.      ED Treatments / Results  Labs (all labs ordered  are listed, but only abnormal results are displayed) Labs Reviewed  COMPREHENSIVE METABOLIC PANEL - Abnormal; Notable for the following components:      Result Value   Potassium 3.4 (*)    Glucose, Bld 140 (*)    Creatinine, Ser 1.03 (*)    GFR calc non Af Amer 53 (*)    All other components within normal limits  CBC - Abnormal; Notable for the following components:   WBC 15.5 (*)    All other components within normal limits  URINALYSIS, ROUTINE W REFLEX MICROSCOPIC - Abnormal; Notable for the following components:   Specific Gravity, Urine 1.032 (*)    Hgb urine dipstick SMALL (*)    Bacteria, UA RARE (*)    All other components within normal limits  DIFFERENTIAL - Abnormal; Notable for the following components:   Neutro Abs 11.2 (*)    Abs Immature Granulocytes 0.17 (*)    All other components within normal limits  POC OCCULT BLOOD, ED - Abnormal; Notable for the following components:   Fecal Occult Bld POSITIVE (*)    All other components within normal limits  SARS CORONAVIRUS 2 (HOSPITAL ORDER, [place] [facility])  C DIFFICILE QUICK SCREEN W PCR REFLEX  LIPASE, BLOOD  GI PATHOGEN PANEL BY PCR, STOOL  TYPE AND SCREEN    EKG None  Radiology Ct Abdomen Pelvis W Contrast  Result Date: 06/12/2019 CLINICAL DATA:  Mid abdomen pain since yesterday, distension, blood in stoolTesting for COVIDHysterectomy, appendectomyNo history cancer EXAM: CT ABDOMEN AND PELVIS WITH CONTRAST TECHNIQUE: Multidetector CT imaging of the abdomen and pelvis was performed using the standard protocol following bolus administration of intravenous contrast. CONTRAST:  157mL OMNIPAQUE IOHEXOL 300 MG/ML  SOLN COMPARISON:  08/09/2018 FINDINGS: Lower chest: No acute abnormality. Hepatobiliary: No focal liver abnormality is seen. No gallstones, gallbladder wall thickening, or biliary dilatation. Pancreas: Unremarkable. No pancreatic  ductal dilatation or surrounding inflammatory changes.  Spleen: Normal in size without focal abnormality. Adrenals/Urinary Tract: No adrenal masses. Kidneys normal in size, orientation and position with symmetric enhancement and excretion. Are bilateral low-attenuation renal masses, largest the left the midpole measuring 4.5 cm, all consistent with cysts. No renal stones. No hydronephrosis. Ureters normal in course and in caliber. Bladder is unremarkable. Stomach/Bowel: Is wall thickening the left transverse colon crossing the splenic flexure to descending colon subtle adjacent hazy inflammatory change. There are also colonic diverticula, but evidence of diverticulitis. No other inflammatory change. Stomach is unremarkable. Small bowel is normal caliber. No wall thickening inflammation. Vascular/Lymphatic: No significant vascular findings are present. No enlarged abdominal or pelvic lymph nodes. Reproductive: Status post hysterectomy. No adnexal masses. Other: No abdominal wall hernia or abnormality. No abdominopelvic ascites. Musculoskeletal: No acute or significant osseous findings. IMPRESSION: 1. Wall thickening of the left colon across the splenic flexure consistent with an infectious or inflammatory colitis. 2. No other evidence of an acute abnormality within the abdomen or pelvis. 3. Colonic diverticula without evidence of diverticulitis. 4. Renal cysts. Electronically Signed   By: Lajean Manes M.D.   On: 06/12/2019 11:59    Procedures Procedures (including critical care time)  Medications Ordered in ED Medications  sodium chloride flush (NS) 0.9 % injection 3 mL (3 mLs Intravenous Not Given 06/12/19 0928)  sodium chloride 0.9 % bolus 1,000 mL (0 mLs Intravenous Stopped 06/12/19 1200)  ondansetron (ZOFRAN) injection 4 mg (4 mg Intravenous Given 06/12/19 1107)  iohexol (OMNIPAQUE) 300 MG/ML solution 100 mL (100 mLs Intravenous Contrast Given 06/12/19 1128)     Initial Impression / Assessment and Plan / ED Course  I have reviewed the triage vital signs  and the nursing notes.  Pertinent labs & imaging results that were available during my care of the patient were reviewed by me and considered in my medical decision making (see chart for details).   CT scan shows colitis   white count elevated.  Patient no longer having blood in her stool here in the emergency department.  Patient prefers to go home.  She is hemodynamically stable her hemoglobin is normal.  I spoke with Dr. Oneida Alar gastroenterology and she wants the patient to give a stool specimen and she can be discharged home with follow-up with GI.  Will hold off on antibiotics.  Patient did present at stool specimen for discharge      Final Clinical Impressions(s) / ED Diagnoses   Final diagnoses:  Lower abdominal pain    ED Discharge Orders    None       Milton Ferguson, MD 06/12/19 1340

## 2019-06-12 NOTE — ED Notes (Signed)
Patient transported to CT 

## 2019-06-12 NOTE — ED Notes (Signed)
Assisted EDP with rectal exam

## 2019-06-12 NOTE — Discharge Instructions (Addendum)
Call Dr. Oneida Alar office tomorrow to get seen this week.  Return if heavy bleeding or severe pain

## 2019-06-13 ENCOUNTER — Telehealth: Payer: Self-pay | Admitting: Gastroenterology

## 2019-06-13 NOTE — Telephone Encounter (Signed)
PT SEEN IN ED FOR BLOODY DIARRHEA. DR. Roderic Palau CALLED FOR REC'S SINCE PT WANTED TO [place]. OPTIONS DISCUSSED: 1. ADMIT OR 2> DISCHARG SINCE PT WAS AFEBRILE AND HDS WITH STOOL SAMPLE TO BE SUBMITTED. NO NEED FOR ABX UNLESS PT FEBRILE. PT WILL RETURN TO ED IF SYMPTOMS WORSE. NEEDS OPV IN 6 WEEKS, Dx: BLOODY DIARRHEA, LAST TCS 12013.

## 2019-06-14 ENCOUNTER — Other Ambulatory Visit: Payer: Self-pay

## 2019-06-14 ENCOUNTER — Ambulatory Visit: Payer: Medicare HMO | Admitting: Gastroenterology

## 2019-06-14 ENCOUNTER — Encounter: Payer: Self-pay | Admitting: Gastroenterology

## 2019-06-14 ENCOUNTER — Telehealth: Payer: Self-pay | Admitting: Gastroenterology

## 2019-06-14 VITALS — BP 145/85 | HR 83 | Temp 95.5°F | Ht 64.0 in | Wt 188.2 lb

## 2019-06-14 DIAGNOSIS — K869 Disease of pancreas, unspecified: Secondary | ICD-10-CM | POA: Insufficient documentation

## 2019-06-14 DIAGNOSIS — K529 Noninfective gastroenteritis and colitis, unspecified: Secondary | ICD-10-CM

## 2019-06-14 DIAGNOSIS — K5730 Diverticulosis of large intestine without perforation or abscess without bleeding: Secondary | ICD-10-CM | POA: Insufficient documentation

## 2019-06-14 DIAGNOSIS — K625 Hemorrhage of anus and rectum: Secondary | ICD-10-CM

## 2019-06-14 MED ORDER — PANTOPRAZOLE SODIUM 40 MG PO TBEC: 40.0000 mg | DELAYED_RELEASE_TABLET | Freq: Every day | ORAL | 3 refills | Status: DC

## 2019-06-14 MED ORDER — PEG 3350-KCL-NA BICARB-NACL 420 G PO SOLR: 4000.0000 mL | ORAL | 0 refills | Status: DC

## 2019-06-14 NOTE — Progress Notes (Signed)
cc'ed to pcp °

## 2019-06-14 NOTE — Telephone Encounter (Signed)
[facility] clinical pool:  I didn't realize until after patient left that she has a history of small pancreatic lesion on CT at [facility] 2019. Needs MRI pancreas to evaluate stability, ?IPMN.   Son is aware we will be calling.

## 2019-06-14 NOTE — Progress Notes (Addendum)
REVIEWED-NO ADDITIONAL RECOMMENDATIONS.    Primary Care Physician:  Fayrene Helper, MD Primary Gastroenterologist:  Dr. Oneida Alar   Chief Complaint  Patient presents with   Rectal Bleeding    bright red    HPI:   Jillian [name] is a 78 y.o. female presenting today after ED visit on 06/12/19 for rectal bleeding. Last colonoscopy/EGD in April 2013:  internal hemorrhoids, mild left-sided diverticulosis, gastritis likely NSAID-induced. Duodenal polyp. Duodenitis and gastritis in setting of aspirin and Ibuprofen. History of normocytic anemia and followed by Hematology, B12 deficiency. Son, Jillian [name], is present with her.   Presented to ED 9/20 with rectal bleeding, CT abd/pelvis with contrast noting wall thickening of left colon across splenic flexure consistent with infectious or inflammatory colitis, no other acute abnormalities. Known diverticula without diverticulitis. Hgb 13.7. WBC count 15.5.   3am Saturday morning started vomiting and having loose stool, small amount, then saw small amount of bleeding. Pain all day Sunday and had rectal bleeding. Crampy abdominal pain. Had more vomiting then loose stool. No fever but had chills.  Small amount of blood this morning on tissue, darker red. Some bleeding yesterday but tapered off. Abdominal pain resolved. Good appetite. Stomach cramping last night after eating cube steak. Ibuprofen occasionally. No PPI. Used to take Protonix. No dysphagia. No diarrhea. Feels back to normal. Would like to eat a bacon, egg, and cheese biscuit for breakfast.   After visit, Care Everywhere was reviewed. 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 Oneida Alar :  internal hemorrhoids, mild left-sided diverticulosis   CYST EXCISION Right    arm   ESOPHAGOGASTRODUODENOSCOPY  2013   Dr. Oneida Alar: gastritis likely NSAID-induced. Duodenal polyp. Duodenitis and gastritis in setting of aspirin and Ibuprofen.    EYE SURGERY Right 06/07/2013   cataract   EYE SURGERY Left 09/14/2013   cataract   KNEE ARTHROSCOPY Right 02/26/2016   Procedure: ARTHROSCOPY RIGHT KNEE WITH MENSICAL DEBRIDEMENT;  Surgeon: Gaynelle Arabian, MD;  [facility];  Service: Orthopedics;  Laterality: Right;   left breast biopsy     LESION REMOVAL Right 03/30/2013   Procedure: EXCISION OF NEOPLASM ARM;  Surgeon: Jamesetta So, MD;  Location: [facility];  Service: General;  Laterality: Right;   TUBAL LIGATION      Current Outpatient Medications  Medication Sig Dispense Refill   acetaminophen (TYLENOL) 500 MG tablet Take 1,000 mg by mouth every 6 (six) hours as  needed for mild pain.     aspirin EC 81 MG tablet Take 1 tablet (81 mg total) by mouth every evening. (Patient taking differently: Take 81 mg by mouth every morning. ) 90 tablet 1   cloNIDine (CATAPRES) 0.2 MG tablet Take one tablet once daily at bedtime for blood pressure 90 tablet 3   cyclobenzaprine (FLEXERIL) 10 MG tablet TAKE 1 TABLET BY MOUTH AT BEDTIME AS NEEDED  FOR BACK SPASM 20 tablet 0   ibuprofen (ADVIL) 800 MG tablet Take 1 tablet (800 mg total) by mouth every 8 (eight) hours as needed. 30 tablet 0   metFORMIN (GLUCOPHAGE-XR) 500 MG 24 hr tablet TAKE 1 TABLET TWICE DAILY (Patient taking differently: Take 500 mg by mouth at bedtime. ) 180 tablet 0   pravastatin (PRAVACHOL) 10 MG tablet TAKE 1 TABLET (10 MG TOTAL) BY MOUTH DAILY. 90 tablet 0   tamoxifen (NOLVADEX) 20 MG tablet Take 1 tablet (20 mg total) by mouth daily. 90 tablet 4   pantoprazole (PROTONIX) 40 MG tablet Take 1 tablet (40 mg total) by mouth daily. Take 30 minutes before breakfast 90 tablet 3   No current facility-administered medications for this visit.     Allergies as of 06/14/2019 - Review Complete 06/14/2019  Allergen Reaction Noted   Codeine Nausea And Vomiting 12/22/2011   Propoxyphene n-acetaminophen Other (See Comments) 08/10/2008    Family History  Problem Relation Age of Onset   Hypertension Father    Congestive Heart Failure Father    Liver cancer Mother    Hypertension Sister    Hypertension Brother    Diabetes Brother    Colon cancer Neg Hx    Colon polyps Neg Hx     Social History   Socioeconomic History   Marital status: Widowed    Spouse name: Not on file   Number of children: Not on file   Years of education: Not on file   Highest education level: Not on file  Occupational History   Not on file  Social Needs   Financial resource strain: Not hard at all   Food insecurity    Worry: Never true    Inability: Never true   Transportation needs    Medical: No    Non-medical: No  Tobacco Use   Smoking status: Never Smoker   Smokeless tobacco: Never Used  Substance and Sexual Activity   Alcohol use: No   Drug use: No   Sexual activity: Not Currently    Birth control/protection: Surgical  Lifestyle   Physical activity    Days per week: 0 days    Minutes per session: 0 min   Stress: Not at all  Relationships    Social connections    Talks on phone: More than three times a week    Gets together: Once a week    Attends religious service: More than 4 times per year    Active member of club or organization: Yes    Attends meetings of clubs or organizations: More than 4 times per year    Relationship status: Widowed   Intimate partner violence    Fear of current or ex partner: No    Emotionally abused: No    Physically abused: No    Forced sexual activity: No  Other Topics Concern   Not on file  Social History Narrative   Not on file    Review of Systems: Gen: Denies any fever, chills, fatigue, weight loss, lack of appetite.  CV: Denies  chest pain, heart palpitations, peripheral edema, syncope.  Resp: Denies shortness of breath at rest or with exertion. Denies wheezing or cough.  GI: see HPI  GU : Denies urinary burning, urinary frequency, urinary hesitancy MS: Denies joint pain, muscle weakness, cramps, or limitation of movement.  Derm: Denies rash, itching, dry skin Psych: Denies depression, anxiety, memory loss, and confusion Heme: see HPI  Physical Exam: BP (!) 145/85    Pulse 83    Temp (!) 95.5 F (35.3 C) (Temporal)    Ht 5\' 4"  (1.626 m)    Wt 188 lb 3.2 oz (85.4 kg)    BMI 32.30 kg/m  General:   Alert and oriented. Pleasant and cooperative. Well-nourished and well-developed.  Head:  Normocephalic and atraumatic. Eyes:  Without icterus, sclera clear and conjunctiva pink.  Ears:  Normal auditory acuity. Lungs:  Clear to auscultation bilaterally. No wheezes, rales, or rhonchi. No distress.  Heart:  S1, S2 present without murmurs appreciated.  Abdomen:  +BS, soft, mild TTP LLQ and non-distended. No HSM noted. No guarding or rebound. No masses appreciated.  Rectal:  Deferred  Msk:  Symmetrical without gross deformities. Normal posture. Extremities:  Without  edema. Neurologic:  Alert and  oriented x4 Psych:  Alert and cooperative. Normal mood and affect.

## 2019-06-14 NOTE — Assessment & Plan Note (Signed)
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 Discussed with son after visit, as I was not aware of this at time of visit. Will pursue MRI pancreas to document stability, and likely need surveillance going forward.

## 2019-06-14 NOTE — Addendum Note (Signed)
Addended by: Cheron Every on: 06/14/2019 11:42 AM   Modules accepted: Orders

## 2019-06-14 NOTE — Telephone Encounter (Signed)
MRI scheduled for 9/29 at 10:00am, arrival 9:30am, npo midnight  Called spoke with patient son and is aware of appt details.  PA submitted via [employer]'s website. PA pending. Faxed clinicals. Tracking #: [id number]

## 2019-06-14 NOTE — Patient Instructions (Addendum)
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 I have included information here about this. If you have recurrent pain, diarrhea, rectal bleeding, call us right away.  Avoid Ibuprofen if at all possible. I have sent in Protonix to take once each morning, 30 minutes before breakfast. This is to help protect your stomach due to history of gastritis and taking aspirin daily.  For now, follow a low fiber/soft diet. I included a handout on this as well. In the next week or so, you can advance to a high fiber diet. Still avoid high fat and greasy foods.   We have arranged a colonoscopy with Dr. Oneida Alar in the future. Do not take metformin the day of the procedure.  We will see you in follow-up afterwards!  It was a pleasure to see you today. I want to create trusting relationships with patients to provide genuine, compassionate, and quality care. I value your feedback. If you receive a survey regarding your visit,  I greatly appreciate you taking time to fill this out.   Annitta Needs, PhD, ANP-BC [facility]    Low-Fiber Eating Plan Fiber is found in fruits, vegetables, whole grains, and beans. Eating a diet low in fiber helps to reduce how often you have bowel movements and how much you produce during a bowel movement. A low-fiber eating plan may help your digestive system heal if:  You have certain conditions, such as Crohn's disease or diverticulitis.  You recently had radiation therapy on your pelvis or bowel.  You recently had intestinal surgery.  You have a new surgical opening in your abdomen (colostomy or ileostomy).  Your intestine is narrowed (stricture). Your health care provider will determine how long you need to stay on this diet. Your health care provider may recommend that you work with a diet and nutrition specialist (dietitian). What are tips for following this plan? General guidelines  Follow recommendations from your  dietitian about how much fiber you should have each day.  Most people on this eating plan should try to eat less than 10 grams (g) of fiber each day. Your daily fiber goal is _________________ g.  Take vitamin and mineral supplements as told by your health care provider or dietitian. Chewable or liquid forms are best when on this eating plan. Reading food labels  Check food labels for the amount of dietary fiber.  Choose foods that have less than 2 grams of fiber in one serving. Cooking  Use white flour and other allowed grains for baking and cooking.  Cook meat using methods that keep it tender, such as braising or poaching.  Cook eggs until the yolk is completely solid.  Cook with healthy oils, such as olive oil or canola oil. Meal planning   Eat 5-6 small meals throughout the day instead of 3 large meals.  If you are lactose intolerant: ? Choose low-lactose dairy foods. ? Do not eat dairy foods, if told by your dietitian.  Limit fat and oils to less than 8 teaspoons a day.  Eat small portions of desserts. What foods are allowed? The items listed below may not be a complete list. Talk with your dietitian about what dietary choices are best for you. Grains All bread and crackers made with white flour. Waffles, pancakes, and [place] toast. Bagels. Pretzels. Melba toast, zwieback, and matzoh. Cooked and dried cereals that do not contain whole grains, added fiber, seeds, or dried fruit. CornmealDomenick Gong. Hot and cold cereals made  with refined corn, wheat, rice, or oats. Plain pasta and noodles. White rice. Vegetables Well-cooked or canned vegetables without skin, seeds, or stems. Cooked potatoes without skins. Vegetable juice. Fruits Soft-cooked or canned fruits without skin and seeds. Peeled ripe banana. Applesauce. Fruit juice without pulp. Meats and other protein foods Ground meat. Tender cuts of meat or poultry. Eggs. Fish, seafood, and shellfish. Smooth nut butters.  Tofu. Dairy All milk products and drinks. Lactose-free milks, including rice, soy, and almond milks. Yogurt without fruit, nuts, chocolate, or granola mix-ins. Sour cream. Cottage cheese. Cheese. Beverages Decaf coffee. Fruit and vegetable juices or smoothies (in small amounts, with no pulp or skins, and with fruits from allowed list). Sports drinks. Herbal tea. Fats and oils Olive oil, canola oil, sunflower oil, flaxseed oil, and grapeseed oil. Mayonnaise. Cream cheese. Margarine. Butter. Sweets and desserts Plain cakes and cookies. Cream pies and pies made with allowed fruits. Pudding. Custard. Fruit gelatin. Sherbet. Popsicles. Ice cream without nuts. Plain hard candy. Honey. Jelly. Molasses. Syrups, including chocolate syrup. Chocolate. Marshmallows. Gumdrops. Seasoning and other foods Bouillon. Broth. Cream soups made from allowed foods. Strained soup. Casseroles made with allowed foods. Ketchup. Mild mustard. Mild salad dressings. Plain gravies. Vinegar. Spices in moderation. Salt. Sugar. What foods are not allowed? The items listed below may not be a complete list. Talk with your dietitian about what dietary choices are best for you. Grains Whole wheat and whole grain breads and crackers. Multigrain breads and crackers. Rye bread. Whole grain or multigrain cereals. Cereals with nuts, raisins, or coconut. Bran. Coarse wheat cereals. Granola. High-fiber cereals. Cornmeal or corn bread. Whole grain pasta. Wild or brown rice. Quinoa. Popcorn. Buckwheat. Wheat germ. Vegetables Potato skins. Raw or undercooked vegetables. All beans and bean sprouts. Cooked greens. Corn. Peas. Cabbage. Beets. Broccoli. Brussels sprouts. Cauliflower. Mushrooms. Onions. Peppers. Parsnips. Okra. Sauerkraut. Fruit Raw or dried fruit. Berries. Fruit juice with pulp. Prune juice. Meats and other protein foods Tough, fibrous meats with gristle. Fatty meat. Poultry with skin. Fried meat, Sales executive, or fish. Deli or lunch  meats. Sausage, bacon, and hot dogs. Nuts and chunky nut butter. Dried peas, beans, and lentils. Dairy Yogurt with fruit, nuts, chocolate, or granola mix-ins. Beverages Caffeinated coffee and teas. Fats and oils Avocado. Coconut. Sweets and desserts Desserts, cookies, or candies that contain nuts or coconut. Dried fruit. Jams and preserves with seeds. Marmalade. Any dessert made with fruits or grains that are not allowed. Seasoning and other foods Corn tortilla chips. Soups made with vegetables or grains that are not allowed. Relish. Horseradish. Jillian [name]. Olives. Summary  Most people on a low-fiber eating plan should eat less than 10 grams of fiber a day. Follow recommendations from your dietitian about how much fiber you should have each day.  Always check food labels to see the dietary fiber content of packaged foods. In general, a low-fiber food will have fewer than 2 grams of fiber per serving.  In general, try to avoid whole grains, raw fruits and vegetables, dried fruit, tough cuts of meat, nuts, and seeds.  Take a vitamin and mineral supplement as told by your health care provider or dietitian. This information is not intended to replace advice given to you by your health care provider. Make sure you discuss any questions you have with your health care provider. Document Released: 02/28/2002 Document Revised: 12/31/2018 Document Reviewed: 11/11/2016 Elsevier Patient Education  2020 [place].  Ischemic Colitis  Ischemic colitis is damage to the large intestine due to reduced blood  flow (ischemia) to the colon. The colon is the last section of the large intestine, where stool is formed. The reduced blood flow may lead to the death of cells (necrosis) in the lining of the colon, damaging the colon and often causing bleeding. Most cases of ischemic colitis clear up in a few days with treatment. In other cases, blood flow does not improve, and parts of the colon start to die. This  is extremely serious and even life-threatening. If this happens, surgery may be required. In some cases, parts of the colon may need to be removed. What are the causes? Ischemic colitis results from a decrease in the blood supply to the colon. Many conditions can cause this, such as:  Heart problems that reduce blood flow to the arteries that supply the colon. These include problems such as coronary heart disease, peripheral vascular disease, atrial fibrillation, and congestive heart failure.  Low blood pressure from: ? An infection that spreads to the blood (sepsis). ? Dehydration or bleeding (shock).  Drugs that narrow blood vessels (vasoconstrictors). Sometimes the cause is not known. What increases the risk? You are more likely to develop this condition if:  You are 77 years of age or older.  You are female.  You have another medical condition, such as: ? Heart disease. ? Diabetes. ? Kidney disease that requires you to be on dialysis. ? A disease that causes blood clots.  You are frequently constipated.  You have had surgery on the heart, blood vessels (such as the aorta), or colon.  You take certain medicines or drugs, such as: ? Medicines that suppress your immune system (immunomodulators). ? Medicines that cause constipation. ? Illegal drugs, such as cocaine or methamphetamines.  You get an extreme amount of exercise from long-distance bike riding or running. What are the signs or symptoms? Symptoms of this condition start suddenly and may include:  Dull pain, usually on the left side of the abdomen.  Tenderness of the abdomen.  Abdomen (abdominal) cramps.  An urgent need to have a bowel movement.  Loose, bloody stools with clots of dark or bright red blood.  Nausea and vomiting.  Fever.  Weakness, fatigue, and confusion. How is this diagnosed? This condition may be diagnosed based on:  Your symptoms, your medical history, and a physical exam.  Tests  to find out more about your condition and to rule out other causes of pain and bleeding. These tests may include: ? Blood tests to check for clotting, blood loss, and low proteins in your blood. ? CT scan of the colon. ? A procedure to examine the inside of your colon using a scope that is passed through the rectum (colonoscopy). Colonoscopy is the most important diagnostic test. During this test, your health care provider may take a small piece of tissue from your colon to be examined under a microscope (biopsy). How is this treated? You may be hospitalized for treatment. Treatment usually includes:  Not eating or drinking anything. This allows the colon to rest.  IV fluids to maintain blood pressure, regulate blood minerals (electrolytes), and provide nutrition.  Having a tube inserted into your stomach through your nose (nasogastric tube) to drain your stomach.  IV antibiotic medicines. These may be used if an infection is suspected.  Stopping or changing medicines that may be causing the condition. You may need surgery if your condition is severe or if it gets worse or does not get better after a few days. Parts of the colon that  will not recover may need to be removed. In some cases, a procedure is also done to attach the healthy part of the colon to the outer wall of the abdomen to drain stool (colostomy). Follow these instructions at home:  Follow instructions from your health care provider about eating or drinking restrictions.  Drink enough fluid to keep your urine clear or pale yellow.  Take over-the-counter and prescription medicines only as told by your health care provider.  Return to your normal activities as told by your health care provider. Ask your health care provider what activities are safe for you.  Do not use any products that contain nicotine or tobacco, such as cigarettes and e-cigarettes. If you need help quitting, ask your health care provider.  Keep all  follow-up visits as told by your health care provider. This is important. Contact a health care provider if:  You have blood in your stool.  You have abdominal pain or cramps.  You have constipation.  You have nausea or vomiting. Get help right away if:  You have a moderate to large amount of loose, bloody stools with clots of dark or bright red blood.  You have severe abdominal pain.  Your abdominal pain has not improved after 24 hours.  You have a fever.  You have not been able to have a bowel movement, and you are in pain and vomiting.  You have shortness of breath.  You are very tired (lethargic) or have confusion. Summary  Ischemic colitis is damage to the large intestine due to reduced blood flow (ischemia) to the colon.  Some of the symptoms of this condition include abdominal pain or tenderness, bloody stools, and an urgent need to have a bowel movement.  Diagnosis usually includes a procedure to examine the inside of the colon using a scope that is passed through the rectum (colonoscopy). This information is not intended to replace advice given to you by your health care provider. Make sure you discuss any questions you have with your health care provider. Document Released: 10/27/2016 Document Revised: 12/31/2018 Document Reviewed: 10/27/2016 Elsevier Patient Education  2020 [employer].

## 2019-06-14 NOTE — Assessment & Plan Note (Signed)
78 year old female with acute onset of N/V/D, abdominal cramping, followed by rectal bleeding several days ago, prompting ED evaluation with CT findings of wall thickening of left colon across splenic flexure, clinically suspicious for ischemic colitis. Hgb 13.7, WBC count 15.5 in ED. Today, she is clinically improved with resolution of pain, diarrhea, N/V, and small amount of blood with wiping. Afebrile. No concerning findings on physical exam. Son, Delilah Shan, present with her. Discussed signs/symptoms that would prompt further attention; for now, will treat supportively. As last colonoscopy in 2013, will arrange colonoscopy in 6-8 weeks.   Proceed with colonoscopy with Dr. Oneida Alar in the near future. The risks, benefits, and alternatives have been discussed in detail with the patient. They state understanding and desire to proceed.  Call if any recurrent symptoms AVOID Ibuprofen Start PPI in setting of chronic aspirin, known history of gastritis/duodenitis Follow-up after colonoscopy

## 2019-06-14 NOTE — Telephone Encounter (Signed)
PA approved [id number] dates 9/29-10/29

## 2019-06-15 ENCOUNTER — Ambulatory Visit: Payer: Medicare HMO | Admitting: Gastroenterology

## 2019-06-21 ENCOUNTER — Other Ambulatory Visit: Payer: Self-pay

## 2019-06-21 ENCOUNTER — Ambulatory Visit (HOSPITAL_COMMUNITY)
Admission: RE | Admit: 2019-06-21 | Discharge: 2019-06-21 | Disposition: A | Discharge status: Home / Self Care | Payer: Medicare HMO | Source: Ambulatory Visit | Attending: Gastroenterology | Admitting: Gastroenterology

## 2019-06-21 ENCOUNTER — Other Ambulatory Visit: Payer: Self-pay | Admitting: Gastroenterology

## 2019-06-21 DIAGNOSIS — K869 Disease of pancreas, unspecified: Secondary | ICD-10-CM

## 2019-06-21 DIAGNOSIS — N281 Cyst of kidney, acquired: Secondary | ICD-10-CM | POA: Diagnosis not present

## 2019-06-21 MED ORDER — GADOBUTROL 1 MMOL/ML IV SOLN
7.0000 mL | Freq: Once | INTRAVENOUS | Status: AC | PRN
  Administered 2019-06-21: 10:00:00 7 mL via INTRAVENOUS

## 2019-06-24 NOTE — Progress Notes (Signed)
No lesion in pancreas; however, this was motion degraded. Consider repeat in 2 years.

## 2019-07-13 ENCOUNTER — Other Ambulatory Visit (HOSPITAL_COMMUNITY): Payer: Self-pay | Admitting: Internal Medicine

## 2019-07-13 DIAGNOSIS — N6091 Unspecified benign mammary dysplasia of right breast: Secondary | ICD-10-CM

## 2019-07-14 NOTE — Telephone Encounter (Signed)
Refill request

## 2019-07-18 ENCOUNTER — Other Ambulatory Visit: Payer: Self-pay

## 2019-07-18 ENCOUNTER — Ambulatory Visit (INDEPENDENT_AMBULATORY_CARE_PROVIDER_SITE_OTHER): Payer: Medicare HMO | Admitting: Family Medicine

## 2019-07-18 ENCOUNTER — Encounter: Payer: Self-pay | Admitting: Family Medicine

## 2019-07-18 VITALS — BP 140/90 | HR 82 | Temp 98.3°F | Resp 15 | Ht 64.0 in | Wt 176.0 lb

## 2019-07-18 DIAGNOSIS — E785 Hyperlipidemia, unspecified: Secondary | ICD-10-CM | POA: Diagnosis not present

## 2019-07-18 DIAGNOSIS — E1159 Type 2 diabetes mellitus with other circulatory complications: Secondary | ICD-10-CM

## 2019-07-18 DIAGNOSIS — M25511 Pain in right shoulder: Secondary | ICD-10-CM

## 2019-07-18 DIAGNOSIS — E559 Vitamin D deficiency, unspecified: Secondary | ICD-10-CM

## 2019-07-18 DIAGNOSIS — G8929 Other chronic pain: Secondary | ICD-10-CM

## 2019-07-18 DIAGNOSIS — E6609 Other obesity due to excess calories: Secondary | ICD-10-CM | POA: Diagnosis not present

## 2019-07-18 DIAGNOSIS — Z6830 Body mass index (BMI) 30.0-30.9, adult: Secondary | ICD-10-CM | POA: Diagnosis not present

## 2019-07-18 DIAGNOSIS — I10 Essential (primary) hypertension: Secondary | ICD-10-CM | POA: Diagnosis not present

## 2019-07-18 MED ORDER — PREDNISONE 10 MG PO TABS: 10.0000 mg | ORAL_TABLET | Freq: Two times a day (BID) | ORAL | 0 refills | Status: DC

## 2019-07-18 MED ORDER — KETOROLAC TROMETHAMINE 60 MG/2ML IM SOLN
60.0000 mg | Freq: Once | INTRAMUSCULAR | Status: AC
  Administered 2019-07-18: 60 mg via INTRAMUSCULAR

## 2019-07-18 MED ORDER — METHYLPREDNISOLONE ACETATE 80 MG/ML IJ SUSP
80.0000 mg | Freq: Once | INTRAMUSCULAR | Status: AC
  Administered 2019-07-18: 12:00:00 80 mg via INTRAMUSCULAR

## 2019-07-18 MED ORDER — BENAZEPRIL-HYDROCHLOROTHIAZIDE 10-12.5 MG PO TABS: 1.0000 | ORAL_TABLET | Freq: Every day | ORAL | 1 refills | Status: DC

## 2019-07-18 NOTE — Assessment & Plan Note (Addendum)
Updated lab needed  Jillian [name] is reminded of the importance of commitment to daily physical activity for 30 minutes or more, as able and the need to limit carbohydrate intake to 30 to 60 grams per meal to help with blood sugar control.   The need to take medication as prescribed, test blood sugar as directed, and to call between visits if there is a concern that blood sugar is uncontrolled is also discussed.   Jillian [name] is reminded of the importance of daily foot exam, annual eye examination, and good blood sugar, blood pressure and cholesterol control.  Diabetic Labs Latest Ref Rng & Units 06/12/2019 04/16/2019 01/31/2019 12/31/2018 11/08/2018  HbA1c <5.7 % of total Hgb - - - - 6.7(H)  Microalbumin mg/dL - - - - 2.1  Micro/Creat Ratio <30 mcg/mg creat - - - - 6  Chol <200 mg/dL - - - - 138  HDL > OR = 50 mg/dL - - - - 52  Calc LDL mg/dL (calc) - - - - 67  Triglycerides <150 mg/dL - - - - 111  Creatinine 0.44 - 1.00 mg/dL 1.03(H) 0.96 0.99 0.84 0.97(H)   BP/Weight 07/18/2019 06/14/2019 06/12/2019 04/18/2019 04/16/2019 03/15/2019 123456  Systolic BP XX123456 Q000111Q 0000000 XX123456 [id number] Q000111Q Q000111Q  Diastolic BP 90 85 88 88 91 85 68  Wt. (Lbs) 176 188.2 176 186.12 178 181 181  BMI 30.21 32.3 30.21 31.95 30.55 31.07 31.07   Foot/eye exam completion dates Latest Ref Rng & Units 04/28/2018 09/09/2017  Eye Exam No Retinopathy - -  Foot exam Order - - -  Foot Form Completion - Done Done

## 2019-07-18 NOTE — Assessment & Plan Note (Signed)
Hyperlipidemia:Low fat diet discussed and encouraged.   Lipid Panel  Lab Results  Component Value Date   CHOL 138 11/08/2018   HDL 52 11/08/2018   LDLCALC 67 11/08/2018   TRIG 111 11/08/2018   CHOLHDL 2.7 11/08/2018     Updated lab needed

## 2019-07-18 NOTE — Assessment & Plan Note (Signed)
Acute flare with reduced mobility, toradol 60 mg and depo medrol 80 mg iM in office followed by short course of ibuprofen and prednisone

## 2019-07-18 NOTE — Assessment & Plan Note (Signed)
  Patient re-educated about  the importance of commitment to a  minimum of 150 minutes of exercise per week as able.  The importance of healthy food choices with portion control discussed, as well as eating regularly and within a 12 hour window most days. The need to choose "clean , green" food 50 to 75% of the time is discussed, as well as to make water the primary drink and set a goal of 64 ounces water daily.    Weight /BMI 07/18/2019 06/14/2019 06/12/2019  WEIGHT 176 lb 188 lb 3.2 oz 176 lb  HEIGHT 5\' 4"  5\' 4"  5\' 4"   BMI 30.21 kg/m2 32.3 kg/m2 30.21 kg/m2

## 2019-07-18 NOTE — Assessment & Plan Note (Signed)
Uncontrolled, add benazepril/ hctz DASH diet and commitment to daily physical activity for a minimum of 30 minutes discussed and encouraged, as a part of hypertension management. The importance of attaining a healthy weight is also discussed.  BP/Weight 07/18/2019 06/14/2019 06/12/2019 04/18/2019 04/16/2019 03/15/2019 123456  Systolic BP XX123456 Q000111Q 0000000 XX123456 XX123456 Q000111Q Q000111Q  Diastolic BP 90 85 88 88 91 85 68  Wt. (Lbs) 176 188.2 176 186.12 178 181 181  BMI 30.21 32.3 30.21 31.95 30.55 31.07 31.07

## 2019-07-18 NOTE — Patient Instructions (Addendum)
F/u with MD in office first week in December re evaluate BP  New for BP is benazeopriil /HCTZ one daily, continue clonidine  Please sched eye exam at [facility] in [place], diabetic andf overdue  Toradol  60 mg and depo Medrol 80 IMin ioffice today  Take iobuprofen 800 mg one twice daily fro next 5 days, also 5 days of prednisone  Is sent tomail order  Labss today, lipid,cmp and EGFr and vit D

## 2019-07-18 NOTE — Progress Notes (Signed)
Jillian [name]     MRN: [id number]      DOB: 11/11/1940   HPI Jillian [name] is here for follow up and re-evaluation of chronic medical conditions, medication management and review of any available recent lab and radiology data.  Preventive health is updated, specifically  Cancer screening and Immunization.   Questions or concerns regarding consultations or procedures which the PT has had in the interim are  addressed. The PT denies any adverse reactions to current medications since the last visit.  Right shoulder pain x 3 weeks, no direct trauma, difficulty in all movement Denies polyuria, polydipsia, blurred vision , or hypoglycemic episodes.  ROS Denies recent fever or chills. Denies sinus pressure, nasal congestion, ear pain or sore throat. Denies chest congestion, productive cough or wheezing. Denies chest pains, palpitations and leg swelling Denies abdominal pain, nausea, vomiting,diarrhea or constipation.   Denies dysuria, frequency, hesitancy or incontinDenies joint pain, swelling and limitation in mobility. Denies headaches, seizures, numbness, or tingling. Denies depression, anxiety or insomnia. Denies skin break down or rash.   PE BP 140/90   Pulse 82   Temp 98.3 F (36.8 C) (Temporal)   Resp 15   Ht 5\' 4"  (1.626 m)   Wt 176 lb (79.8 kg)   SpO2 97%   BMI 30.21 kg/m    Patient alert and oriented and in no cardiopulmonary distress.  HEENT: No facial asymmetry, EOMI,     Neck supple .  Chest: Clear to auscultation bilaterally.  CVS: S1, S2 no murmurs, no S3.Regular rate.  ABD: Soft non tender.   Ext: No edema  MS: Adequate ROM spine, , hips and knees.Markedly reduced ROM of right shoulder Skin: Intact, no ulcerations or rash noted.  Psych: Good eye contact, normal affect. Memory intact not anxious or depressed appearing.  CNS: CN 2-12 intact, power,  normal throughout.no focal deficits noted.   Assessment & Plan  Type 2 diabetes mellitus with  vascular disease ([place]) Updated lab needed  Jillian [name] is reminded of the importance of commitment to daily physical activity for 30 minutes or more, as able and the need to limit carbohydrate intake to 30 to 60 grams per meal to help with blood sugar control.   The need to take medication as prescribed, test blood sugar as directed, and to call between visits if there is a concern that blood sugar is uncontrolled is also discussed.   Jillian [name] is reminded of the importance of daily [name] exam, annual eye examination, and good blood sugar, blood pressure and cholesterol control.  Diabetic Labs Latest Ref Rng & Units 06/12/2019 04/16/2019 01/31/2019 12/31/2018 11/08/2018  HbA1c <5.7 % of total Hgb - - - - 6.7(H)  Microalbumin mg/dL - - - - 2.1  Micro/Creat Ratio <30 mcg/mg creat - - - - 6  Chol <200 mg/dL - - - - 138  HDL > OR = 50 mg/dL - - - - 52  Calc LDL mg/dL (calc) - - - - 67  Triglycerides <150 mg/dL - - - - 111  Creatinine 0.44 - 1.00 mg/dL 1.03(H) 0.96 0.99 0.84 0.97(H)   BP/Weight 07/18/2019 06/14/2019 06/12/2019 04/18/2019 04/16/2019 03/15/2019 123456  Systolic BP XX123456 Q000111Q 0000000 XX123456 XX123456 Q000111Q Q000111Q  Diastolic BP 90 85 88 88 91 85 68  Wt. (Lbs) 176 188.2 176 186.12 178 181 181  BMI 30.21 32.3 30.21 31.95 30.55 31.07 31.07   [name]/eye exam completion dates Latest Ref Rng & Units 04/28/2018 09/09/2017  Eye Exam No  Retinopathy - -  [name] exam Order - - -  [name] Form Completion - Done Done        Shoulder pain, right Acute flare with reduced mobility, toradol 60 mg and depo medrol 80 mg iM in office followed by short course of ibuprofen and prednisone  ESSENTIAL HYPERTENSION, BENIGN Uncontrolled, add benazepril/ hctz DASH diet and commitment to daily physical activity for a minimum of 30 minutes discussed and encouraged, as a part of hypertension management. The importance of attaining a healthy weight is also discussed.  BP/Weight 07/18/2019 06/14/2019 06/12/2019 04/18/2019 04/16/2019  03/15/2019 123456  Systolic BP XX123456 Q000111Q 0000000 XX123456 XX123456 Q000111Q Q000111Q  Diastolic BP 90 85 88 88 91 85 68  Wt. (Lbs) 176 188.2 176 186.12 178 181 181  BMI 30.21 32.3 30.21 31.95 30.55 31.07 31.07       Hyperlipidemia with target LDL less than 100 Hyperlipidemia:Low fat diet discussed and encouraged.   Lipid Panel  Lab Results  Component Value Date   CHOL 138 11/08/2018   HDL 52 11/08/2018   LDLCALC 67 11/08/2018   TRIG 111 11/08/2018   CHOLHDL 2.7 11/08/2018     Updated lab needed    Obese  Patient re-educated about  the importance of commitment to a  minimum of 150 minutes of exercise per week as able.  The importance of healthy food choices with portion control discussed, as well as eating regularly and within a 12 hour window most days. The need to choose "clean , green" food 50 to 75% of the time is discussed, as well as to make water the primary drink and set a goal of 64 ounces water daily.    Weight /BMI 07/18/2019 06/14/2019 06/12/2019  WEIGHT 176 lb 188 lb 3.2 oz 176 lb  HEIGHT 5\' 4"  5\' 4"  5\' 4"   BMI 30.21 kg/m2 32.3 kg/m2 30.21 kg/m2

## 2019-07-19 ENCOUNTER — Other Ambulatory Visit: Payer: Self-pay | Admitting: Family Medicine

## 2019-07-19 LAB — COMPLETE METABOLIC PANEL WITH GFR
AG Ratio: 1.5 (calc) (ref 1.0–2.5)
ALT: 10 U/L (ref 6–29)
AST: 16 U/L (ref 10–35)
Albumin: 4 g/dL (ref 3.6–5.1)
Alkaline phosphatase (APISO): 51 U/L (ref 37–153)
BUN/Creatinine Ratio: 12 (calc) (ref 6–22)
BUN: 12 mg/dL (ref 7–25)
CO2: 26 mmol/L (ref 20–32)
Calcium: 9.3 mg/dL (ref 8.6–10.4)
Chloride: 110 mmol/L (ref 98–110)
Creat: 0.97 mg/dL — ABNORMAL HIGH (ref 0.60–0.93)
GFR, Est African American: 65 mL/min/{1.73_m2} (ref >=60)
GFR, Est Non African American: 56 mL/min/{1.73_m2} — ABNORMAL LOW (ref >=60)
Globulin: 2.6 g/dL (calc) (ref 1.9–3.7)
Glucose, Bld: 93 mg/dL (ref 65–139)
Potassium: 4.1 mmol/L (ref 3.5–5.3)
Sodium: 143 mmol/L (ref 135–146)
Total Bilirubin: 0.3 mg/dL (ref 0.2–1.2)
Total Protein: 6.6 g/dL (ref 6.1–8.1)

## 2019-07-19 LAB — LIPID PANEL
Cholesterol: 118 mg/dL (ref <200)
HDL: 46 mg/dL — ABNORMAL LOW (ref >=50)
LDL Cholesterol (Calc): 50 mg/dL (calc)
Non-HDL Cholesterol (Calc): 72 mg/dL (calc) (ref <130)
Total CHOL/HDL Ratio: 2.6 (calc) (ref <5.0)
Triglycerides: 137 mg/dL (ref <150)

## 2019-07-19 LAB — VITAMIN D 25 HYDROXY (VIT D DEFICIENCY, FRACTURES): Vit D, 25-Hydroxy: 21 ng/mL — ABNORMAL LOW (ref 30–100)

## 2019-07-19 MED ORDER — ERGOCALCIFEROL 1.25 MG (50000 UT) PO CAPS: 50000.0000 [IU] | ORAL_CAPSULE | ORAL | 1 refills | Status: DC

## 2019-07-19 NOTE — Progress Notes (Signed)
Vit d 

## 2019-07-20 LAB — HM DIABETES EYE EXAM

## 2019-08-02 ENCOUNTER — Inpatient Hospital Stay (HOSPITAL_COMMUNITY): Payer: Medicare HMO | Attending: Hematology

## 2019-08-02 ENCOUNTER — Other Ambulatory Visit: Payer: Self-pay

## 2019-08-02 DIAGNOSIS — R61 Generalized hyperhidrosis: Secondary | ICD-10-CM | POA: Insufficient documentation

## 2019-08-02 DIAGNOSIS — D241 Benign neoplasm of right breast: Secondary | ICD-10-CM | POA: Insufficient documentation

## 2019-08-02 DIAGNOSIS — Z90710 Acquired absence of both cervix and uterus: Secondary | ICD-10-CM | POA: Diagnosis not present

## 2019-08-02 DIAGNOSIS — Z808 Family history of malignant neoplasm of other organs or systems: Secondary | ICD-10-CM | POA: Insufficient documentation

## 2019-08-02 DIAGNOSIS — E559 Vitamin D deficiency, unspecified: Secondary | ICD-10-CM | POA: Diagnosis not present

## 2019-08-02 DIAGNOSIS — E538 Deficiency of other specified B group vitamins: Secondary | ICD-10-CM | POA: Insufficient documentation

## 2019-08-02 DIAGNOSIS — Z7984 Long term (current) use of oral hypoglycemic drugs: Secondary | ICD-10-CM | POA: Insufficient documentation

## 2019-08-02 DIAGNOSIS — Z79810 Long term (current) use of selective estrogen receptor modulators (SERMs): Secondary | ICD-10-CM | POA: Insufficient documentation

## 2019-08-02 DIAGNOSIS — D649 Anemia, unspecified: Secondary | ICD-10-CM | POA: Diagnosis not present

## 2019-08-02 DIAGNOSIS — I10 Essential (primary) hypertension: Secondary | ICD-10-CM | POA: Diagnosis not present

## 2019-08-02 DIAGNOSIS — Z7982 Long term (current) use of aspirin: Secondary | ICD-10-CM | POA: Insufficient documentation

## 2019-08-02 DIAGNOSIS — E119 Type 2 diabetes mellitus without complications: Secondary | ICD-10-CM | POA: Insufficient documentation

## 2019-08-02 LAB — CBC WITH DIFFERENTIAL/PLATELET
Abs Immature Granulocytes: 0.09 10*3/uL — ABNORMAL HIGH (ref 0.00–0.07)
Basophils Absolute: 0 10*3/uL (ref 0.0–0.1)
Basophils Relative: 0 %
Eosinophils Absolute: 0 10*3/uL (ref 0.0–0.5)
Eosinophils Relative: 0 %
HCT: 37.4 % (ref 36.0–46.0)
Hemoglobin: 11.5 g/dL — ABNORMAL LOW (ref 12.0–15.0)
Immature Granulocytes: 1 %
Lymphocytes Relative: 32 %
Lymphs Abs: 3.9 10*3/uL (ref 0.7–4.0)
MCH: 30.4 pg (ref 26.0–34.0)
MCHC: 30.7 g/dL (ref 30.0–36.0)
MCV: 98.9 fL (ref 80.0–100.0)
Monocytes Absolute: 0.9 10*3/uL (ref 0.1–1.0)
Monocytes Relative: 7 %
Neutro Abs: 7.2 10*3/uL (ref 1.7–7.7)
Neutrophils Relative %: 60 %
Platelets: 172 10*3/uL (ref 150–400)
RBC: 3.78 MIL/uL — ABNORMAL LOW (ref 3.87–5.11)
RDW: 14.2 % (ref 11.5–15.5)
WBC: 12.1 10*3/uL — ABNORMAL HIGH (ref 4.0–10.5)
nRBC: 0 % (ref 0.0–0.2)

## 2019-08-02 LAB — COMPREHENSIVE METABOLIC PANEL
ALT: 9 U/L (ref 0–44)
AST: 12 U/L — ABNORMAL LOW (ref 15–41)
Albumin: 3.5 g/dL (ref 3.5–5.0)
Alkaline Phosphatase: 50 U/L (ref 38–126)
Anion gap: 8 (ref 5–15)
BUN: 20 mg/dL (ref 8–23)
CO2: 26 mmol/L (ref 22–32)
Calcium: 9 mg/dL (ref 8.9–10.3)
Chloride: 110 mmol/L (ref 98–111)
Creatinine, Ser: 1.62 mg/dL — ABNORMAL HIGH (ref 0.44–1.00)
GFR calc Af Amer: 35 mL/min — ABNORMAL LOW (ref >60)
GFR calc non Af Amer: 30 mL/min — ABNORMAL LOW (ref >60)
Glucose, Bld: 118 mg/dL — ABNORMAL HIGH (ref 70–99)
Potassium: 3.7 mmol/L (ref 3.5–5.1)
Sodium: 144 mmol/L (ref 135–145)
Total Bilirubin: 0.5 mg/dL (ref 0.3–1.2)
Total Protein: 6.5 g/dL (ref 6.5–8.1)

## 2019-08-02 LAB — VITAMIN B12: Vitamin B-12: 2330 pg/mL — ABNORMAL HIGH (ref 180–914)

## 2019-08-02 LAB — VITAMIN D 25 HYDROXY (VIT D DEFICIENCY, FRACTURES): Vit D, 25-Hydroxy: 19.88 ng/mL — ABNORMAL LOW (ref 30–100)

## 2019-08-02 LAB — LACTATE DEHYDROGENASE: LDH: 116 U/L (ref 98–192)

## 2019-08-02 LAB — FERRITIN: Ferritin: 50 ng/mL (ref 11–307)

## 2019-08-02 LAB — IRON AND TIBC
Iron: 74 ug/dL (ref 28–170)
Saturation Ratios: 24 % (ref 10.4–31.8)
TIBC: 312 ug/dL (ref 250–450)
UIBC: 238 ug/dL

## 2019-08-02 LAB — FOLATE: Folate: 9 ng/mL (ref >5.9)

## 2019-08-08 ENCOUNTER — Other Ambulatory Visit: Payer: Self-pay

## 2019-08-09 ENCOUNTER — Inpatient Hospital Stay (HOSPITAL_COMMUNITY): Payer: Medicare HMO | Admitting: Nurse Practitioner

## 2019-08-09 ENCOUNTER — Encounter (HOSPITAL_COMMUNITY): Payer: Self-pay | Admitting: Nurse Practitioner

## 2019-08-09 VITALS — BP 109/86 | HR 88 | Temp 97.5°F | Resp 18 | Wt 187.5 lb

## 2019-08-09 DIAGNOSIS — D241 Benign neoplasm of right breast: Secondary | ICD-10-CM

## 2019-08-09 DIAGNOSIS — Z79810 Long term (current) use of selective estrogen receptor modulators (SERMs): Secondary | ICD-10-CM | POA: Diagnosis not present

## 2019-08-09 DIAGNOSIS — D649 Anemia, unspecified: Secondary | ICD-10-CM

## 2019-08-09 DIAGNOSIS — E119 Type 2 diabetes mellitus without complications: Secondary | ICD-10-CM | POA: Diagnosis not present

## 2019-08-09 DIAGNOSIS — E559 Vitamin D deficiency, unspecified: Secondary | ICD-10-CM | POA: Diagnosis not present

## 2019-08-09 DIAGNOSIS — I10 Essential (primary) hypertension: Secondary | ICD-10-CM | POA: Diagnosis not present

## 2019-08-09 DIAGNOSIS — E538 Deficiency of other specified B group vitamins: Secondary | ICD-10-CM | POA: Diagnosis not present

## 2019-08-09 DIAGNOSIS — Z90710 Acquired absence of both cervix and uterus: Secondary | ICD-10-CM | POA: Diagnosis not present

## 2019-08-09 DIAGNOSIS — R61 Generalized hyperhidrosis: Secondary | ICD-10-CM | POA: Diagnosis not present

## 2019-08-09 MED ORDER — FERROUS SULFATE 325 (65 FE) MG PO TBEC: 325.0000 mg | DELAYED_RELEASE_TABLET | Freq: Every day | ORAL | 3 refills | Status: DC

## 2019-08-09 MED ORDER — ERGOCALCIFEROL 1.25 MG (50000 UT) PO CAPS: 50000.0000 [IU] | ORAL_CAPSULE | ORAL | 4 refills | Status: DC

## 2019-08-09 NOTE — Assessment & Plan Note (Addendum)
1.  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 - Patient started tamoxifen early 2016.  She is tolerating it very well.   -She denies any vaginal bleeding or spotting.  Her mobility is limited from the sequelae of viral meningitis in August. - Her last mammogram was 01/31/2019 which was B RADS category 1 negative.  We reviewed the results today. - Physical examination today shows right breast lower inner quadrant scar with no palpable masses, no palpable adenopathy. -We will continue the tamoxifen for 5 years. - She will follow-up in 6 months with repeat labs.  2.  Normocytic anemia: - Labs on 08/02/2019 showed her hemoglobin at 11.5, ferritin 50, percent saturation 24.  Platelets 176 and LDH 116. -I have recommended her taking oral iron daily.  To help improve her fatigue. - We will continue to monitor.  3.  Vitamin B-12 deficiency: -Labs on 01/31/2019 showed her B12 level at 170. - We will give her a vitamin B12 injection today. -She will start on oral B12 tablets daily -Repeat labs on 08/02/2019 showed her vitamin B12 level 2330. -She will start taking her B12 every other day. -We will recheck her labs on her next visit.  4.  Night sweats: -Patient reports the tamoxifen has never bothered her as far as hot flashes or sweats.  Over the past month she is starting to have have drenching sweats at night.  She reports she is more fatigued. -We will check SPEP and other labs at her next visit.  5.  Increased creatinine: -Baseline creatinine is 0.86-1.05 -Labs on 08/02/2019 showed her creatinine 1.62. -Patient reports she has not been drinking as much fluids.  She will increase her fluid intake -We will recheck her next visit.  6.  Vitamin D deficiency: -Labs on 08/02/2019 showed vitamin D level 19.88 -I prescribed vit D 50,000 units weekly.

## 2019-08-09 NOTE — Progress Notes (Signed)
[place], [place] [address]   CLINIC:  Medical Oncology/Hematology  PCP:  Fayrene Helper, MD [address] [place] [address] [phone]   REASON FOR VISIT: Follow-up for intraductal papilloma with atypical ductal hyperplasia with apocrine features  CURRENT THERAPY: Tamoxifen   INTERVAL HISTORY:  Jillian [name] 78 y.o. 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  She reports she is having soaking night sweats.  It has been getting worse over the past few months.  She denies any bright red bleeding per rectum or melena.  Denies any nausea, vomiting, or diarrhea. Denies any new pains. Had not noticed any recent bleeding such as epistaxis, hematuria or hematochezia. Denies recent chest pain on exertion, shortness of breath on minimal exertion, pre-syncopal episodes, or palpitations. Denies any numbness or tingling in hands or feet. Denies any recent fevers, infections, or recent hospitalizations. Patient reports appetite at 100% and energy level at 75%.  She is eating well maintain her weight at this time.     REVIEW OF SYSTEMS:  Review of Systems  Constitutional: Positive for fatigue.  Endocrine: Positive for hot flashes.  Psychiatric/Behavioral: Positive for sleep disturbance.  All other systems reviewed and are negative.    PAST MEDICAL/SURGICAL HISTORY:  Past Medical History:  Diagnosis Date  . Atypical ductal hyperplasia of breast   . Chronic fatigue   . Diabetes mellitus 2011  . DJD (degenerative joint disease)    of neck    . Dyslipidemia   . 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 Hypertension 2005  . Mild obesity   . Postmenopausal   . Viral meningitis   . Wears glasses    Past Surgical History:  Procedure Laterality Date  . ABDOMINAL HYSTERECTOMY  1992   fibroids  .  APPENDECTOMY    . BREAST EXCISIONAL BIOPSY Right    benign  . BREAST LUMPECTOMY WITH RADIOACTIVE SEED LOCALIZATION Right 10/05/2014   Procedure: BREAST LUMPECTOMY WITH RADIOACTIVE SEED LOCALIZATION;  Surgeon: Erroll Luna, MD;  Location: [facility];  Service: General;  Laterality: Right;  . cataract Bilateral   . COLONOSCOPY  2013   Dr. Oneida Alar :  internal hemorrhoids, mild left-sided diverticulosis  . CYST EXCISION Right    arm  . ESOPHAGOGASTRODUODENOSCOPY  2013   Dr. Oneida Alar: gastritis likely NSAID-induced. Duodenal polyp. Duodenitis and gastritis in setting of aspirin and Ibuprofen.   Marland Kitchen EYE SURGERY Right 06/07/2013   cataract  . EYE SURGERY Left 09/14/2013   cataract  . KNEE ARTHROSCOPY Right 02/26/2016   Procedure: ARTHROSCOPY RIGHT KNEE WITH MENSICAL DEBRIDEMENT;  Surgeon: Gaynelle Arabian, MD;  Location: [facility];  Service: Orthopedics;  Laterality: Right;  . left breast biopsy    . LESION REMOVAL Right 03/30/2013   Procedure: EXCISION OF NEOPLASM ARM;  Surgeon: Jamesetta So, MD;  Location: [facility];  Service: General;  Laterality: Right;  . TUBAL LIGATION       SOCIAL HISTORY:  Social History   Socioeconomic History  . Marital status: Widowed    Spouse name: Not on file  . Number of children: Not on file  . Years of education: Not on file  . Highest education level: Not on file  Occupational History  . Not on file  Social Needs  . Financial resource strain: Not hard at all  . Food  insecurity    Worry: Never true    Inability: Never true  . Transportation needs    Medical: No    Non-medical: No  Tobacco Use  . Smoking status: Never Smoker  . Smokeless tobacco: Never Used  Substance and Sexual Activity  . Alcohol use: No  . Drug use: No  . Sexual activity: Not Currently    Birth control/protection: Surgical  Lifestyle  . Physical activity    Days per week: 0 days    Minutes per session: 0 min  . Stress: Not at all  Relationships  . Social  connections    Talks on phone: More than three times a week    Gets together: Once a week    Attends religious service: More than 4 times per year    Active member of club or organization: Yes    Attends meetings of clubs or organizations: More than 4 times per year    Relationship status: Widowed  . Intimate partner violence    Fear of current or ex partner: No    Emotionally abused: No    Physically abused: No    Forced sexual activity: No  Other Topics Concern  . Not on file  Social History Narrative  . Not on file    FAMILY HISTORY:  Family History  Problem Relation Age of Onset  . Hypertension Father   . Congestive Heart Failure Father   . Liver cancer Mother   . Hypertension Sister   . Hypertension Brother   . Diabetes Brother   . Colon cancer Neg Hx   . Colon polyps Neg Hx     CURRENT MEDICATIONS:  Outpatient Encounter Medications as of 08/09/2019  Medication Sig  . aspirin EC 81 MG tablet Take 1 tablet (81 mg total) by mouth every evening. (Patient taking differently: Take 81 mg by mouth every morning. )  . benazepril-hydrochlorthiazide (LOTENSIN HCT) 10-12.5 MG tablet Take 1 tablet by mouth daily.  . cloNIDine (CATAPRES) 0.2 MG tablet Take one tablet once daily at bedtime for blood pressure  . ergocalciferol (VITAMIN D2) 1.25 MG (50000 UT) capsule Take 1 capsule (50,000 Units total) by mouth once a week. One capsule once weekly  . metFORMIN (GLUCOPHAGE-XR) 500 MG 24 hr tablet TAKE 1 TABLET TWICE DAILY (Patient taking differently: Take 500 mg by mouth at bedtime. )  . pantoprazole (PROTONIX) 40 MG tablet Take 1 tablet (40 mg total) by mouth daily. Take 30 minutes before breakfast  . pravastatin (PRAVACHOL) 10 MG tablet TAKE 1 TABLET (10 MG TOTAL) BY MOUTH DAILY.  . tamoxifen (NOLVADEX) 20 MG tablet TAKE 1 TABLET (20 MG TOTAL) BY MOUTH DAILY.  Marland Kitchen acetaminophen (TYLENOL) 500 MG tablet Take 1,000 mg by mouth every 6 (six) hours as needed for mild pain.  .  cyclobenzaprine (FLEXERIL) 10 MG tablet TAKE 1 TABLET BY MOUTH AT BEDTIME AS NEEDED FOR BACK SPASM (Patient not taking: Reported on 08/09/2019)  . ergocalciferol (VITAMIN D2) 1.25 MG (50000 UT) capsule Take 1 capsule (50,000 Units total) by mouth once a week.  . ferrous sulfate 325 (65 FE) MG EC tablet Take 1 tablet (325 mg total) by mouth daily.  Marland Kitchen ibuprofen (ADVIL) 800 MG tablet Take 1 tablet (800 mg total) by mouth every 8 (eight) hours as needed. (Patient not taking: Reported on 08/09/2019)  . [DISCONTINUED] polyethylene glycol-electrolytes (TRILYTE) 420 g solution Take 4,000 mLs by mouth as directed.  . [DISCONTINUED] predniSONE (DELTASONE) 10 MG tablet Take 1 tablet (10 mg  total) by mouth 2 (two) times daily with a meal.   No facility-administered encounter medications on file as of 08/09/2019.     ALLERGIES:  Allergies  Allergen Reactions  . Codeine Nausea And Vomiting  . Propoxyphene N-Acetaminophen Other (See Comments)    hallucinations     PHYSICAL EXAM:  ECOG Performance status: 1  Vitals:   08/09/19 1110  BP: 109/86  Pulse: 88  Resp: 18  Temp: (!) 97.5 F (36.4 C)  SpO2: 100%   Filed Weights   08/09/19 1110  Weight: 187 lb 8 oz (85 kg)    Physical Exam Constitutional:      Appearance: Normal appearance. She is normal weight.  Cardiovascular:     Rate and Rhythm: Normal rate and regular rhythm.     Heart sounds: Normal heart sounds.  Pulmonary:     Effort: Pulmonary effort is normal.     Breath sounds: Normal breath sounds.  Abdominal:     General: Bowel sounds are normal.     Palpations: Abdomen is soft.  Musculoskeletal: Normal range of motion.  Skin:    General: Skin is warm.  Neurological:     Mental Status: She is alert and oriented to person, place, and time. Mental status is at baseline.  Psychiatric:        Mood and Affect: Mood normal.        Behavior: Behavior normal.        Thought Content: Thought content normal.        Judgment:  Judgment normal.      LABORATORY DATA:  I have reviewed the labs as listed.  CBC    Component Value Date/Time   WBC 12.1 (H) 08/02/2019 1020   RBC 3.78 (L) 08/02/2019 1020   HGB 11.5 (L) 08/02/2019 1020   HCT 37.4 08/02/2019 1020   PLT 172 08/02/2019 1020   MCV 98.9 08/02/2019 1020   MCH 30.4 08/02/2019 1020   MCHC 30.7 08/02/2019 1020   RDW 14.2 08/02/2019 1020   LYMPHSABS 3.9 08/02/2019 1020   MONOABS 0.9 08/02/2019 1020   EOSABS 0.0 08/02/2019 1020   BASOSABS 0.0 08/02/2019 1020   CMP Latest Ref Rng & Units 08/02/2019 07/18/2019 06/12/2019  Glucose 70 - 99 mg/dL 118(H) 93 140(H)  BUN 8 - 23 mg/dL 20 12 14   Creatinine 0.44 - 1.00 mg/dL 1.62(H) 0.97(H) 1.03(H)  Sodium 135 - 145 mmol/L 144 143 140  Potassium 3.5 - 5.1 mmol/L 3.7 4.1 3.4(L)  Chloride 98 - 111 mmol/L 110 110 105  CO2 22 - 32 mmol/L 26 26 26   Calcium 8.9 - 10.3 mg/dL 9.0 9.3 8.9  Total Protein 6.5 - 8.1 g/dL 6.5 6.6 7.2  Total Bilirubin 0.3 - 1.2 mg/dL 0.5 0.3 0.8  Alkaline Phos 38 - 126 U/L 50 - 56  AST 15 - 41 U/L 12(L) 16 15  ALT 0 - 44 U/L 9 10 13    I personally performed a face-to-face visit.  All questions were answered to patient's stated satisfaction. Encouraged patient to call with any new concerns or questions before his next visit to the cancer center and we can certain see him sooner, if needed.     ASSESSMENT & PLAN:   Papilloma of right breast 1.  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 - Patient started tamoxifen early 2016.  She is tolerating it very well.   -She denies any vaginal bleeding or spotting.  Her mobility is limited from the sequelae of viral meningitis in August. - Her last mammogram was 01/31/2019 which was B RADS category 1 negative.  We reviewed the results today. - Physical examination today shows right breast lower inner quadrant scar with no palpable masses, no palpable  adenopathy. -We will continue the tamoxifen for 5 years. - She will follow-up in 6 months with repeat labs.  2.  Normocytic anemia: - Labs on 08/02/2019 showed her hemoglobin at 11.5, ferritin 50, percent saturation 24.  Platelets 176 and LDH 116. -I have recommended her taking oral iron daily.  To help improve her fatigue. - We will continue to monitor.  3.  Vitamin B-12 deficiency: -Labs on 01/31/2019 showed her B12 level at 170. - We will give her a vitamin B12 injection today. -She will start on oral B12 tablets daily -Repeat labs on 08/02/2019 showed her vitamin B12 level 2330. -She will start taking her B12 every other day. -We will recheck her labs on her next visit.  4.  Night sweats: -Patient reports the tamoxifen has never bothered her as far as hot flashes or sweats.  Over the past month she is starting to have have drenching sweats at night.  She reports she is more fatigued. -We will check SPEP and other labs at her next visit.  5.  Increased creatinine: -Baseline creatinine is 0.86-1.05 -Labs on 08/02/2019 showed her creatinine 1.62. -Patient reports she has not been drinking as much fluids.  She will increase her fluid intake -We will recheck her next visit.  6.  Vitamin D deficiency: -Labs on 08/02/2019 showed vitamin D level 19.88 -I prescribed vit D 50,000 units weekly.       Orders placed this encounter:  Orders Placed This Encounter  Procedures  . BCR-ABL1 FISH  . Lactate dehydrogenase  . Protein electrophoresis, serum  . Kappa/lambda light chains  . CBC with Differential/Platelet  . Comprehensive metabolic panel  . Ferritin  . Iron and TIBC  . Vitamin B12  . Vitamin D 25 hydroxy      Francene Finders, FNP-C [facility] [phone]

## 2019-08-09 NOTE — Patient Instructions (Signed)
[facility] at [facility] Discharge Instructions  Follow up in 3 months with lab s   Thank you for choosing [facility] at [facility] to provide your oncology and hematology care.  To afford each patient quality time with our provider, please arrive at least 15 minutes before your scheduled appointment time.   If you have a lab appointment with the Cancer Center please come in thru the Main Entrance and check in at the main information desk.  You need to re-schedule your appointment should you arrive 10 or more minutes late.  We strive to give you quality time with our providers, and arriving late affects you and other patients whose appointments are after yours.  Also, if you no show three or more times for appointments you may be dismissed from the clinic at the providers discretion.     Again, thank you for choosing [facility].  Our hope is that these requests will decrease the amount of time that you wait before being seen by our physicians.       _____________________________________________________________  Should you have questions after your visit to [facility], please contact our office at [phone] between the hours of 8:00 a.m. and 4:30 p.m.  Voicemails left after 4:00 p.m. will not be returned until the following business day.  For prescription refill requests, have your pharmacy contact our office and allow 72 hours.    Due to Covid, you will need to wear a mask upon entering the hospital. If you do not have a mask, a mask will be given to you at the Main Entrance upon arrival. For doctor visits, patients may have 1 support person with them. For treatment visits, patients can not have anyone with them due to social distancing guidelines and our immunocompromised population.      

## 2019-08-22 ENCOUNTER — Telehealth: Payer: Self-pay

## 2019-08-22 NOTE — Telephone Encounter (Signed)
SLF will not be available 09/02/19. Called pt to reschedule TCS. TCS rescheduled to 09/21/19 at 3:00pm (pt aware she may have clear liquids until NPO time morning of procedure). COVID test rescheduled to 09/19/19 at 9:35am. Appt letter mailed with new procedure instructions. Endo scheduler informed.

## 2019-08-31 ENCOUNTER — Other Ambulatory Visit (HOSPITAL_COMMUNITY): Payer: Medicare HMO

## 2019-08-31 ENCOUNTER — Other Ambulatory Visit (HOSPITAL_COMMUNITY): Payer: Self-pay | Admitting: *Deleted

## 2019-08-31 ENCOUNTER — Other Ambulatory Visit: Payer: Self-pay | Admitting: Family Medicine

## 2019-08-31 DIAGNOSIS — D649 Anemia, unspecified: Secondary | ICD-10-CM

## 2019-08-31 MED ORDER — ERGOCALCIFEROL 1.25 MG (50000 UT) PO CAPS: 50000.0000 [IU] | ORAL_CAPSULE | ORAL | 4 refills | Status: DC

## 2019-09-01 ENCOUNTER — Other Ambulatory Visit (HOSPITAL_COMMUNITY): Payer: Self-pay | Admitting: Nurse Practitioner

## 2019-09-01 ENCOUNTER — Ambulatory Visit: Payer: Medicare HMO | Admitting: Family Medicine

## 2019-09-01 ENCOUNTER — Ambulatory Visit (INDEPENDENT_AMBULATORY_CARE_PROVIDER_SITE_OTHER): Payer: Medicare HMO | Admitting: Family Medicine

## 2019-09-01 ENCOUNTER — Encounter: Payer: Self-pay | Admitting: Family Medicine

## 2019-09-01 ENCOUNTER — Other Ambulatory Visit: Payer: Self-pay

## 2019-09-01 DIAGNOSIS — G8929 Other chronic pain: Secondary | ICD-10-CM

## 2019-09-01 DIAGNOSIS — M25511 Pain in right shoulder: Secondary | ICD-10-CM

## 2019-09-01 DIAGNOSIS — D649 Anemia, unspecified: Secondary | ICD-10-CM

## 2019-09-01 DIAGNOSIS — E559 Vitamin D deficiency, unspecified: Secondary | ICD-10-CM

## 2019-09-01 MED ORDER — ERGOCALCIFEROL 1.25 MG (50000 UT) PO CAPS: 50000.0000 [IU] | ORAL_CAPSULE | ORAL | 4 refills | Status: DC

## 2019-09-01 NOTE — Progress Notes (Deleted)
Subjective:   Jillian [name] is a 78 y.o. female who presents for Medicare Annual (Subsequent) preventive examination.  Review of Systems:          Objective:     Vitals: There were no vitals taken for this visit.  There is no height or weight on file to calculate BMI.  Advanced Directives 08/09/2019 06/12/2019 02/07/2019 12/31/2018 08/18/2018 08/09/2018 08/02/2018  Does Patient Have a Medical Advance Directive? Yes Yes Yes No;Yes Yes Yes No;Yes  Type of Paramedic of [place];Living will Living will Living will;Healthcare Power of [place];Living will Living will Living will Living will  Does patient want to make changes to medical advance directive? No - Patient declined - No - Patient declined - No - Patient declined - -  Copy of [place] in Chart? No - copy requested - No - copy requested - - - -  Would patient like information on creating a medical advance directive? No - Patient declined - - - - - -  Pre-existing out of facility DNR order (yellow form or pink MOST form) - - - - - - -    Tobacco Social History   Tobacco Use  Smoking Status Never Smoker  Smokeless Tobacco Never Used     Counseling given: Not Answered   Clinical Intake:                       Past Medical History:  Diagnosis Date  . Atypical ductal hyperplasia of breast   . Chronic fatigue   . Diabetes mellitus 2011  . DJD (degenerative joint disease)    of neck    . Dyslipidemia   . 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 Hypertension 2005  . Mild obesity   . Postmenopausal   . Viral meningitis   . Wears glasses    Past Surgical History:  Procedure Laterality Date  . ABDOMINAL HYSTERECTOMY  1992   fibroids  . APPENDECTOMY    . BREAST EXCISIONAL BIOPSY Right    benign  . BREAST LUMPECTOMY WITH RADIOACTIVE SEED LOCALIZATION Right 10/05/2014   Procedure: BREAST LUMPECTOMY WITH RADIOACTIVE SEED LOCALIZATION;  Surgeon: Erroll Luna, MD;  Location: [place];  Service: General;  Laterality: Right;  . cataract Bilateral   . COLONOSCOPY  2013   Dr. Oneida Alar :  internal hemorrhoids, mild left-sided diverticulosis  . CYST EXCISION Right    arm  . ESOPHAGOGASTRODUODENOSCOPY  2013   Dr. Oneida Alar: gastritis likely NSAID-induced. Duodenal polyp. Duodenitis and gastritis in setting of aspirin and Ibuprofen.   Marland Kitchen EYE SURGERY Right 06/07/2013   cataract  . EYE SURGERY Left 09/14/2013   cataract  . KNEE ARTHROSCOPY Right 02/26/2016   Procedure: ARTHROSCOPY RIGHT KNEE WITH MENSICAL DEBRIDEMENT;  Surgeon: Gaynelle Arabian, MD;  [facility];  Service: Orthopedics;  Laterality: Right;  . left breast biopsy    . LESION REMOVAL Right 03/30/2013   Procedure: EXCISION OF NEOPLASM ARM;  Surgeon: Jamesetta So, MD;  Location[facility];  Service: General;  Laterality: Right;  . TUBAL LIGATION     Family History  Problem Relation Age of Onset  . Hypertension Father   . Congestive Heart Failure Father   . Liver cancer Mother   . Hypertension Sister   . Hypertension Brother   . Diabetes Brother   . Colon cancer Neg  Hx   . Colon polyps Neg Hx    Social History   Socioeconomic History  . Marital status: Widowed    Spouse name: Not on file  . Number of children: Not on file  . Years of education: Not on file  . Highest education level: Not on file  Occupational History  . Not on file  Tobacco Use  . Smoking status: Never Smoker  . Smokeless tobacco: Never Used  Substance and Sexual Activity  . Alcohol use: No  . Drug use: No  . Sexual activity: Not Currently    Birth control/protection: Surgical  Other Topics Concern  . Not on file  Social History Narrative  . Not on file   Social Determinants of Health   Financial Resource Strain: Low Risk   . Difficulty of Paying Living Expenses: Not hard at all  Food Insecurity: No  Food Insecurity  . Worried About Charity fundraiser in the Last Year: Never true  . Ran Out of Food in the Last Year: Never true  Transportation Needs: No Transportation Needs  . Lack of Transportation (Medical): No  . Lack of Transportation (Non-Medical): No  Physical Activity: Inactive  . Days of Exercise per Week: 0 days  . Minutes of Exercise per Session: 0 min  Stress: No Stress Concern Present  . Feeling of Stress : Not at all  Social Connections: Slightly Isolated  . Frequency of Communication with Friends and Family: More than three times a week  . Frequency of Social Gatherings with Friends and Family: Once a week  . Attends Religious Services: More than 4 times per year  . Active Member of Clubs or Organizations: Yes  . Attends Archivist Meetings: More than 4 times per year  . Marital Status: Widowed    Outpatient Encounter Medications as of 09/01/2019  Medication Sig  . acetaminophen (TYLENOL) 500 MG tablet Take 1,000 mg by mouth every 6 (six) hours as needed for mild pain.  Marland Kitchen aspirin EC 81 MG tablet Take 1 tablet (81 mg total) by mouth every evening. (Patient taking differently: Take 81 mg by mouth every morning. )  . benazepril-hydrochlorthiazide (LOTENSIN HCT) 10-12.5 MG tablet Take 1 tablet by mouth daily.  . cloNIDine (CATAPRES) 0.2 MG tablet Take one tablet once daily at bedtime for blood pressure  . cyclobenzaprine (FLEXERIL) 10 MG tablet TAKE 1 TABLET BY MOUTH AT BEDTIME AS NEEDED FOR BACK SPASM (Patient not taking: Reported on 08/09/2019)  . ergocalciferol (VITAMIN D2) 1.25 MG (50000 UT) capsule Take 1 capsule (50,000 Units total) by mouth once a week.  . ferrous sulfate 325 (65 FE) MG EC tablet Take 1 tablet (325 mg total) by mouth daily.  Marland Kitchen ibuprofen (ADVIL) 800 MG tablet Take 1 tablet (800 mg total) by mouth every 8 (eight) hours as needed. (Patient not taking: Reported on 08/09/2019)  . metFORMIN (GLUCOPHAGE-XR) 500 MG 24 hr tablet TAKE 1 TABLET TWICE  DAILY  . pantoprazole (PROTONIX) 40 MG tablet Take 1 tablet (40 mg total) by mouth daily. Take 30 minutes before breakfast  . pravastatin (PRAVACHOL) 10 MG tablet TAKE 1 TABLET (10 MG TOTAL) BY MOUTH DAILY.  . tamoxifen (NOLVADEX) 20 MG tablet TAKE 1 TABLET (20 MG TOTAL) BY MOUTH DAILY.   No facility-administered encounter medications on file as of 09/01/2019.    Activities of Daily Living In your present state of health, do you have any difficulty performing the following activities: 02/03/2019  Hearing? N  Vision? N  Difficulty concentrating or making decisions? N  Walking or climbing stairs? N  Dressing or bathing? N  Doing errands, shopping? N  Preparing Food and eating ? N  Using the Toilet? N  In the past six months, have you accidently leaked urine? N  Do you have problems with loss of bowel control? N  Managing your Medications? N  Managing your Finances? N  Housekeeping or managing your Housekeeping? N  Some recent data might be hidden    Patient Care Team: Fayrene Helper, MD as PCP - General Fields, Marga Melnick, MD (Gastroenterology) Hurley Cisco, MD as Consulting Physician (Rheumatology) Whitney Muse, Kelby Fam, MD (Inactive) as Consulting Physician (Hematology and Oncology)    Assessment:   This is a routine wellness examination for Shawntina.  Exercise Activities and Dietary recommendations    Goals    . Exercise 3x per week (30 min per time)     Recommend starting a routine exercise program at least 3 days a week for 30-45 minutes at a time as tolerated.         Fall Risk Fall Risk  07/18/2019 04/18/2019 03/15/2019 02/15/2019 02/03/2019  Falls in the past year? 0 0 0 0 0  Number falls in past yr: 0 - 0 0 -  Injury with Fall? 0 0 0 0 0  Risk Factor Category  - - - - -  Risk for fall due to : - - - - -  Follow up - - - - -   Is the patient's home free of loose throw rugs in walkways, pet beds, electrical cords, etc?   {Blank single:19197::"yes","no"}       Grab bars in the bathroom? {Blank single:19197::"yes","no"}      Handrails on the stairs?   {Blank single:19197::"yes","no"}      Adequate lighting?   {Blank single:19197::"yes","no"}     Depression Screen PHQ 2/9 Scores 04/18/2019 03/15/2019 02/15/2019 02/03/2019  PHQ - 2 Score 0 0 0 0  PHQ- 9 Score - - - -     Cognitive Function     6CIT Screen 02/03/2019 01/11/2018 01/01/2017  What Year? 0 points 0 points 0 points  What month? 0 points 0 points 0 points  What time? 0 points 0 points 0 points  Count back from 20 4 points 0 points 0 points  Months in reverse 0 points 0 points 0 points  Repeat phrase 0 points 0 points 0 points  Total Score 4 0 0    Immunization History  Administered Date(s) Administered  . Fluad Quad(high Dose 65+) 05/18/2019  . H1N1 10/16/2008  . Influenza Whole 07/02/2007, 06/14/2009, 06/04/2010  . Influenza,inj,Quad PF,6+ Mos 07/20/2013, 06/01/2014, 06/26/2015, 07/28/2016, 05/19/2017  . Pneumococcal Conjugate-13 09/28/2014  . Pneumococcal Polysaccharide-23 04/20/2009  . Td 05/01/2004  . Zoster 10/23/2006    Qualifies for Shingles Vaccine?   Screening Tests Health Maintenance  Topic Date Due  . HEMOGLOBIN A1C  05/09/2019  . TETANUS/TDAP  10/28/2019 (Originally 05/01/2014)  . FOOT EXAM  07/17/2020  . OPHTHALMOLOGY EXAM  07/19/2020  . INFLUENZA VACCINE  Completed  . DEXA SCAN  Completed  . PNA vac Low Risk Adult  Completed    Cancer Screenings: Lung: Low Dose CT Chest recommended if Age 3-80 years, 30 pack-year currently smoking OR have quit w/in 15years. Patient {DOES NOT does:27190::"does not"} qualify. Breast:  Up to date on Mammogram? {Yes/No:30480221}   Up to date of Bone Density/Dexa? {Yes/No:30480221} Colorectal:    Additional Screenings:  : Hepatitis C Screening:  Plan:       I have personally reviewed and noted the following in the patient's chart:   . Medical and social history . Use of alcohol, tobacco or illicit drugs  .  Current medications and supplements . Functional ability and status . Nutritional status . Physical activity . Advanced directives . List of other physicians . Hospitalizations, surgeries, and ER visits in previous 12 months . Vitals . Screenings to include cognitive, depression, and falls . Referrals and appointments  In addition, I have reviewed and discussed with patient certain preventive protocols, quality metrics, and best practice recommendations. A written personalized care plan for preventive services as well as general preventive health recommendations were provided to patient.     Philis Pique Bentleigh Waren, CMA  09/01/2019

## 2019-09-01 NOTE — Progress Notes (Signed)
Virtual Visit via Telephone Note   This visit type was conducted due to national recommendations for restrictions regarding the COVID-19 Pandemic (e.g. social distancing) in an effort to limit this patient's exposure and mitigate transmission in our community.  Due to her co-morbid illnesses, this patient is at least at moderate risk for complications without adequate follow up.  This format is felt to be most appropriate for this patient at this time.  The patient did not have access to video technology/had technical difficulties with video requiring transitioning to audio format only (telephone).  All issues noted in this document were discussed and addressed.  No physical exam could be performed with this format.    Evaluation Performed:  Follow-up visit  Date:  09/01/2019   ID:  Jillian, [name] Mar 29, 1941, MRN [id number]  Patient Location: Home Provider Location: Office  Location of Patient: Home Location of Provider: Telehealth Consent was obtain for visit to be over via telehealth. I verified that I am speaking with the correct person using two identifiers.  PCP:  Fayrene Helper, MD   Chief Complaint: Right shoulder pain  History of Present Illness:    Jillian [name] is a 78 y.o. female with history of diabetes, dyslipidemia, hypertension, mild obesity, DJD of neck among others.  Presents today for follow-up on right shoulder pain.  Was seen by Dr. Moshe Cipro back in October 26 at that visit she received injection of Toradol and prednisone along with a short course of ibuprofen and prednisone.  Reports that when she stopped taking the medication her shoulder pain returned.  Is wanting to know if she can take a medication her friend takes that is considered a inflammatory medication but her kidney function is not where it needs to be for that.  Today she reports that she still has ongoing right shoulder pain that appears to be starting to be chronic in nature.  Denies any  trauma or injury.  Reports difficulty in moving washing her hair and lifting her arm up.  This is a phone visit so limited PE examination.  The patient does not have symptoms concerning for COVID-19 infection (fever, chills, cough, or new shortness of breath).  Past Medical, Surgical, Social History, Allergies, and Medications have been Reviewed.  Past Medical History:  Diagnosis Date  . Atypical ductal hyperplasia of breast   . Chronic fatigue   . Diabetes mellitus 2011  . DJD (degenerative joint disease)    of neck    . Dyslipidemia   . 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 Hypertension 2005  . Mild obesity   . Postmenopausal   . Viral meningitis   . Wears glasses    Past Surgical History:  Procedure Laterality Date  . ABDOMINAL HYSTERECTOMY  1992   fibroids  . APPENDECTOMY    . BREAST EXCISIONAL BIOPSY Right    benign  . BREAST LUMPECTOMY WITH RADIOACTIVE SEED LOCALIZATION Right 10/05/2014   Procedure: BREAST LUMPECTOMY WITH RADIOACTIVE SEED LOCALIZATION;  Surgeon: Erroll Luna, MD;  Location: [place];  Service: General;  Laterality: Right;  . cataract Bilateral   . COLONOSCOPY  2013   Dr. Oneida Alar :  internal hemorrhoids, mild left-sided diverticulosis  . CYST EXCISION Right    arm  . ESOPHAGOGASTRODUODENOSCOPY  2013   Dr. Oneida Alar: gastritis likely NSAID-induced. Duodenal polyp. Duodenitis and gastritis in setting of aspirin and Ibuprofen.   Marland Kitchen  EYE SURGERY Right 06/07/2013   cataract  . EYE SURGERY Left 09/14/2013   cataract  . KNEE ARTHROSCOPY Right 02/26/2016   Procedure: ARTHROSCOPY RIGHT KNEE WITH MENSICAL DEBRIDEMENT;  Surgeon: Gaynelle Arabian, MD;  Location: [facility];  Service: Orthopedics;  Laterality: Right;  . left breast biopsy    . LESION REMOVAL Right 03/30/2013   Procedure: EXCISION OF NEOPLASM ARM;  Surgeon: Jamesetta So, MD;  Location: [facility];  Service: General;  Laterality: Right;  .  TUBAL LIGATION       Current Meds  Medication Sig  . acetaminophen (TYLENOL) 500 MG tablet Take 1,000 mg by mouth every 6 (six) hours as needed for mild pain.  Marland Kitchen aspirin EC 81 MG tablet Take 1 tablet (81 mg total) by mouth every evening. (Patient taking differently: Take 81 mg by mouth every morning. )  . benazepril-hydrochlorthiazide (LOTENSIN HCT) 10-12.5 MG tablet Take 1 tablet by mouth daily.  . cloNIDine (CATAPRES) 0.2 MG tablet Take one tablet once daily at bedtime for blood pressure  . cyclobenzaprine (FLEXERIL) 10 MG tablet TAKE 1 TABLET BY MOUTH AT BEDTIME AS NEEDED FOR BACK SPASM  . ergocalciferol (VITAMIN D2) 1.25 MG (50000 UT) capsule Take 1 capsule (50,000 Units total) by mouth once a week.  . ferrous sulfate 325 (65 FE) MG EC tablet Take 1 tablet (325 mg total) by mouth daily.  Marland Kitchen ibuprofen (ADVIL) 800 MG tablet Take 1 tablet (800 mg total) by mouth every 8 (eight) hours as needed.  . metFORMIN (GLUCOPHAGE-XR) 500 MG 24 hr tablet TAKE 1 TABLET TWICE DAILY  . pantoprazole (PROTONIX) 40 MG tablet Take 1 tablet (40 mg total) by mouth daily. Take 30 minutes before breakfast  . pravastatin (PRAVACHOL) 10 MG tablet TAKE 1 TABLET (10 MG TOTAL) BY MOUTH DAILY.  . tamoxifen (NOLVADEX) 20 MG tablet TAKE 1 TABLET (20 MG TOTAL) BY MOUTH DAILY.     Allergies:   Codeine and Propoxyphene n-acetaminophen   Social History   Tobacco Use  . Smoking status: Never Smoker  . Smokeless tobacco: Never Used  Substance Use Topics  . Alcohol use: No  . Drug use: No     Family Hx: The patient's family history includes Congestive Heart Failure in her father; Diabetes in her brother; Hypertension in her brother, father, and sister; Liver cancer in her mother. There is no history of Colon cancer or Colon polyps.  ROS:   Please see the history of present illness.    All other systems reviewed and are negative.   Labs/Other Tests and Data Reviewed:    Recent Labs: 11/08/2018: TSH 1.08  08/02/2019: ALT 9; BUN 20; Creatinine, Ser 1.62; Hemoglobin 11.5; Platelets 172; Potassium 3.7; Sodium 144   Recent Lipid Panel Lab Results  Component Value Date/Time   CHOL 118 07/18/2019 12:27 PM   TRIG 137 07/18/2019 12:27 PM   HDL 46 (L) 07/18/2019 12:27 PM   CHOLHDL 2.6 07/18/2019 12:27 PM   LDLCALC 50 07/18/2019 12:27 PM    Wt Readings from Last 3 Encounters:  08/09/19 187 lb 8 oz (85 kg)  07/18/19 176 lb (79.8 kg)  06/14/19 188 lb 3.2 oz (85.4 kg)     Objective:    Vital Signs:  There were no vitals taken for this visit.   GEN:  Alert and oriented RESPIRATORY:  No shortness of breath noted in conversation PSYCH:  Normal affect and mood, good communication   ASSESSMENT & PLAN:    1. Chronic right shoulder pain  Signs and symptoms are consistent with ongoing chronic right shoulder pain.  Possibly related to arthritis.  Reports that her sister has the same issue going on.  Given kidney function do not want to do any repeat ibuprofen or NSAID type medication.  Encouraged use of Tylenol at this time.  And will follow up with x-ray and orthopedic surgery here in the near future with referral.  Reviewed side effects, risks and benefits of medication.   Patient acknowledged agreement and understanding of the plan.   - DG Shoulder Right - Ambulatory referral to Orthopedic Surgery   Time:   Today, I have spent 10 minutes with the patient with telehealth technology discussing the above problems.     Medication Adjustments/Labs and Tests Ordered: Current medicines are reviewed at length with the patient today.  Concerns regarding medicines are outlined above.   Tests Ordered: Orders Placed This Encounter  Procedures  . DG Shoulder Right  . Ambulatory referral to Orthopedic Surgery    Medication Changes: No orders of the defined types were placed in this encounter.   Disposition:  Follow up 02/07/2020   Signed, Perlie Mayo, NP  09/01/2019 10:31 AM      [facility]

## 2019-09-01 NOTE — Patient Instructions (Signed)
  I appreciate the opportunity to provide you with care for your health and wellness. Today we discussed: Ongoing right shoulder pain.  Follow up: Follow-up as previously scheduled  No labs  X-ray ordered today and referral placed for orthopedics. Please use Tylenol for any pain and discomfort in addition to heating pad and/or ice.   I hope you have a wonderful, happy, safe, and healthy Holiday Season! See you in the New Year :)  Please continue to practice social distancing to keep you, your family, and our community safe.  If you must go out, please wear a mask and practice good handwashing.  It was a pleasure to see you and I look forward to continuing to work together on your health and well-being. Please do not hesitate to call the office if you need care or have questions about your care.  Have a wonderful day and week. With Gratitude, Cherly Beach, DNP, AGNP-BC

## 2019-09-08 ENCOUNTER — Telehealth: Payer: Self-pay | Admitting: *Deleted

## 2019-09-08 ENCOUNTER — Other Ambulatory Visit: Payer: Self-pay

## 2019-09-08 MED ORDER — GLUCOSE BLOOD VI STRP: ORAL_STRIP | 0 refills | Status: DC

## 2019-09-08 NOTE — Telephone Encounter (Signed)
Patient needs some accucheck test strips sent to [facility] she is going to be out of the test strips

## 2019-09-08 NOTE — Telephone Encounter (Signed)
Strips sent to pharmacy

## 2019-09-13 ENCOUNTER — Telehealth: Payer: Self-pay | Admitting: Gastroenterology

## 2019-09-13 NOTE — Telephone Encounter (Signed)
Tried to call pt, no answer, no answering machine. 

## 2019-09-13 NOTE — Telephone Encounter (Signed)
Pt called to confirm if SF would be doing her procedure on 12/30. I told her yes, she was on SF procedure schedule for that day. Then she asked if we could change her prep where she wouldn't have to drink a gallon of prep. She uses [facility] on [address]. Please advise. [phone]

## 2019-09-13 NOTE — Telephone Encounter (Signed)
Called pt, informed her that her insurance doesn't cover low volume prep. Offered to send in low volume prep but may cost up to $100. She prefers to use prep that was prescribed.

## 2019-09-14 ENCOUNTER — Ambulatory Visit: Payer: Medicare HMO

## 2019-09-14 ENCOUNTER — Other Ambulatory Visit: Payer: Self-pay | Admitting: Family Medicine

## 2019-09-14 ENCOUNTER — Encounter: Payer: Self-pay | Admitting: Orthopedic Surgery

## 2019-09-14 ENCOUNTER — Ambulatory Visit: Payer: Medicare HMO | Admitting: Orthopedic Surgery

## 2019-09-14 ENCOUNTER — Other Ambulatory Visit: Payer: Self-pay

## 2019-09-14 VITALS — BP 126/85 | HR 88 | Temp 97.1°F | Ht 65.0 in | Wt 183.0 lb

## 2019-09-14 DIAGNOSIS — M7551 Bursitis of right shoulder: Secondary | ICD-10-CM | POA: Diagnosis not present

## 2019-09-14 DIAGNOSIS — M25511 Pain in right shoulder: Secondary | ICD-10-CM

## 2019-09-14 NOTE — Progress Notes (Signed)
Patient ID: Jillian [name], female   DOB: April 29, 1941, 78 y.o.   MRN: [id number]  Chief Complaint  Patient presents with  . Shoulder Pain    Right shoulder pain, no injury.    HPI Jillian [name] is a 78 y.o. female.  Presents for evaluation of right shoulder pain x3 months.  She complains of mild right shoulder pain along the deltoid she has had it for 3 months she notices increasing and decreasing pain which seems to come and go and when present causes her to lose range of motion  Review of Systems Review of Systems  Constitutional: Positive for diaphoresis.  All other systems reviewed and are negative.   Past Medical History:  Diagnosis Date  . Atypical ductal hyperplasia of breast   . Chronic fatigue   . Diabetes mellitus 2011  . DJD (degenerative joint disease)    of neck    . Dyslipidemia   . 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 Hypertension 2005  . Mild obesity   . Postmenopausal   . Viral meningitis   . Wears glasses     Past Surgical History:  Procedure Laterality Date  . ABDOMINAL HYSTERECTOMY  1992   fibroids  . APPENDECTOMY    . BREAST EXCISIONAL BIOPSY Right    benign  . BREAST LUMPECTOMY WITH RADIOACTIVE SEED LOCALIZATION Right 10/05/2014   Procedure: BREAST LUMPECTOMY WITH RADIOACTIVE SEED LOCALIZATION;  Surgeon: Erroll Luna, MD;  Location: [place];  Service: General;  Laterality: Right;  . cataract Bilateral   . COLONOSCOPY  2013   Dr. Oneida Alar :  internal hemorrhoids, mild left-sided diverticulosis  . CYST EXCISION Right    arm  . ESOPHAGOGASTRODUODENOSCOPY  2013   Dr. Oneida Alar: gastritis likely NSAID-induced. Duodenal polyp. Duodenitis and gastritis in setting of aspirin and Ibuprofen.   Marland Kitchen EYE SURGERY Right 06/07/2013   cataract  . EYE SURGERY Left 09/14/2013   cataract  . KNEE ARTHROSCOPY Right 02/26/2016   Procedure: ARTHROSCOPY RIGHT KNEE WITH MENSICAL DEBRIDEMENT;   Surgeon: Gaynelle Arabian, MD;  Location: [facility];  Service: Orthopedics;  Laterality: Right;  . left breast biopsy    . LESION REMOVAL Right 03/30/2013   Procedure: EXCISION OF NEOPLASM ARM;  Surgeon: Jamesetta So, MD;  Location: [facility];  Service: General;  Laterality: Right;  . TUBAL LIGATION      Family History  Problem Relation Age of Onset  . Hypertension Father   . Congestive Heart Failure Father   . Liver cancer Mother   . Hypertension Sister   . Hypertension Brother   . Diabetes Brother   . Colon cancer Neg Hx   . Colon polyps Neg Hx      Social History   Tobacco Use  . Smoking status: Never Smoker  . Smokeless tobacco: Never Used  Substance Use Topics  . Alcohol use: No  . Drug use: No    Allergies  Allergen Reactions  . Codeine Nausea And Vomiting  . Propoxyphene N-Acetaminophen Other (See Comments)    hallucinations    @ALL @  Current Meds  Medication Sig  . acetaminophen (TYLENOL) 500 MG tablet Take 1,000 mg by mouth every 6 (six) hours as needed for mild pain.  Marland Kitchen aspirin EC 81 MG tablet Take 1 tablet (81 mg total) by mouth every evening. (Patient taking differently: Take 81 mg by mouth at bedtime. )  . cloNIDine (CATAPRES) 0.2  MG tablet Take one tablet once daily at bedtime for blood pressure (Patient taking differently: Take 0.2 mg by mouth at bedtime. )  . cyclobenzaprine (FLEXERIL) 10 MG tablet TAKE 1 TABLET BY MOUTH AT BEDTIME AS NEEDED FOR BACK SPASM (Patient taking differently: Take 10 mg by mouth at bedtime as needed for muscle spasms. )  . ergocalciferol (VITAMIN D2) 1.25 MG (50000 UT) capsule Take 1 capsule (50,000 Units total) by mouth once a week. (Patient taking differently: Take 50,000 Units by mouth once a week. Saturday)  . ferrous sulfate 325 (65 FE) MG EC tablet Take 1 tablet (325 mg total) by mouth daily. (Patient taking differently: Take 325 mg by mouth at bedtime. )  . glucose blood test strip Use as instructed to check blood glucose once  daily  . ibuprofen (ADVIL) 800 MG tablet Take 1 tablet (800 mg total) by mouth every 8 (eight) hours as needed. (Patient taking differently: Take 800 mg by mouth every 8 (eight) hours as needed for mild pain or moderate pain. )  . metFORMIN (GLUCOPHAGE-XR) 500 MG 24 hr tablet TAKE 1 TABLET TWICE DAILY (Patient taking differently: Take 500 mg by mouth at bedtime. )  . pantoprazole (PROTONIX) 40 MG tablet Take 1 tablet (40 mg total) by mouth daily. Take 30 minutes before breakfast (Patient taking differently: Take 40 mg by mouth at bedtime. Take 30 minutes before breakfast)  . pravastatin (PRAVACHOL) 10 MG tablet TAKE 1 TABLET (10 MG TOTAL) BY MOUTH DAILY. (Patient taking differently: Take 10 mg by mouth at bedtime. )  . tamoxifen (NOLVADEX) 20 MG tablet TAKE 1 TABLET (20 MG TOTAL) BY MOUTH DAILY. (Patient taking differently: Take 20 mg by mouth at bedtime. )  . vitamin B-12 (CYANOCOBALAMIN) 1000 MCG tablet Take 1,000 mcg by mouth every Monday, Wednesday, and Friday.       Physical Exam BP 126/85   Pulse 88   Temp (!) 97.1 F (36.2 C)   Ht 5\' 5"  (1.651 m)   Wt 183 lb (83 kg)   BMI 30.45 kg/m  Physical Exam Vitals and nursing note reviewed.  Constitutional:      Appearance: Normal appearance.  Neurological:     Mental Status: She is alert and oriented to person, place, and time.  Psychiatric:        Mood and Affect: Mood normal.    Ambulatory status normal with no assistive devices Right Shoulder Exam   Muscle Strength  The patient has normal right shoulder strength.  Tests  Apprehension: negative Cross arm: negative Impingement: positive Drop arm: negative Sulcus: absent  Other  Erythema: absent Scars: absent Sensation: normal  Comments:  Tenderness in the right anterolateral deltoid with decreased internal rotation decreased flexion normal external rotation   Left Shoulder Exam   Tenderness  The patient is experiencing no tenderness.   Range of Motion  The  patient has normal left shoulder ROM.  Muscle Strength  The patient has normal left shoulder strength.  Tests  Apprehension: negative Impingement: negative  Other  Erythema: absent Sensation: normal       PROVOCATIVE TESTS See above  MEDICAL DECISION SECTION  xrays ordered?  Yes  My independent reading of xrays: X-rays were ordered in the office she had mild acromioclavicular arthritis small spur at the inferior margin of the glenoid and greater tuberosity sclerosis but the glenohumeral joint was otherwise normal   Encounter Diagnoses  Name Primary?  . Right shoulder pain, unspecified chronicity Yes  . Bursitis of right  shoulder      PLAN:   No orders of the defined types were placed in this encounter.  Injection?  Yes MRI/CT/?  No   Procedure note the subacromial injection shoulder RIGHT    Verbal consent was obtained to inject the  RIGHT   Shoulder  Timeout was completed to confirm the injection site is a subacromial space of the  RIGHT  shoulder   Medication used Depo-Medrol 40 mg and lidocaine 1% 3 cc  Anesthesia was provided by ethyl chloride  The injection was performed in the RIGHT  posterior subacromial space. After pinning the skin with alcohol and anesthetized the skin with ethyl chloride the subacromial space was injected using a 20-gauge needle. There were no complications  Sterile dressing was applied.

## 2019-09-14 NOTE — Patient Instructions (Addendum)
You have received an injection of steroids into the joint. 15% of patients will have increased pain within the 24 hours postinjection.   This is transient and will go away.   We recommend that you use ice packs on the injection site for 20 minutes every 2 hours and extra strength Tylenol 2 tablets every 8 as needed until the pain resolves.  If you continue to have pain after taking the Tylenol and using the ice please call the office for further instructions.   Shoulder Impingement Syndrome  Shoulder impingement syndrome is a condition that causes pain when connective tissues (tendons) surrounding the shoulder joint become pinched. These tendons are part of the group of muscles and tissues that help to stabilize the shoulder (rotator cuff). Beneath the rotator cuff is a fluid-filled sac (bursa) that allows the muscles and tendons to glide smoothly. The bursa may become swollen or irritated (bursitis). Bursitis, swelling in the rotator cuff tendons, or both conditions can decrease how much space is under a bone in the shoulder joint (acromion), resulting in impingement. What are the causes? Shoulder impingement syndrome may be caused by bursitis or swelling of the rotator cuff tendons, which may result from:  Repetitive overhead arm movements.  Falling onto the shoulder.  Weakness in the shoulder muscles. What increases the risk? You may be more likely to develop this condition if you:  Play sports that involve throwing, such as baseball.  Participate in sports such as tennis, volleyball, and swimming.  Work as a Curator, Games developer, or Architect. Some people are also more likely to develop impingement syndrome because of the shape of their acromion bone. What are the signs or symptoms? The main symptom of this condition is pain on the front or side of the shoulder. The pain may:  Get worse when lifting or raising the arm.  Get worse at night.  Wake you up from sleeping.  Feel  sharp when the shoulder is moved and then fade to an ache. Other symptoms may include:  Tenderness.  Stiffness.  Inability to raise the arm above shoulder level or behind the body.  Weakness. How is this diagnosed? This condition may be diagnosed based on:  Your symptoms and medical history.  A physical exam.  Imaging tests, such as: ? X-rays. ? MRI. ? Ultrasound. How is this treated? This condition may be treated by:  Resting your shoulder and avoiding all activities that cause pain or put stress on the shoulder.  Icing your shoulder.  NSAIDs to help reduce pain and swelling.  One or more injections of medicines to numb the area and reduce inflammation.  Physical therapy.  Surgery. This may be needed if nonsurgical treatments have not helped. Surgery may involve repairing the rotator cuff, reshaping the acromion, or removing the bursa. Follow these instructions at home: Managing pain, stiffness, and swelling   If directed, put ice on the injured area. ? Put ice in a plastic bag. ? Place a towel between your skin and the bag. ? Leave the ice on for 20 minutes, 2-3 times a day. Activity  Rest and return to your normal activities as told by your health care provider. Ask your health care provider what activities are safe for you.  Do exercises as told by your health care provider. General instructions  Do not use any products that contain nicotine or tobacco, such as cigarettes, e-cigarettes, and chewing tobacco. These can delay healing. If you need help quitting, ask your health care provider.  Ask your health care provider when it is safe for you to drive.  Take over-the-counter and prescription medicines only as told by your health care provider.  Keep all follow-up visits as told by your health care provider. This is important. How is this prevented?  Give your body time to rest between periods of activity.  Be safe and responsible while being active.  This will help you avoid falls.  Maintain physical fitness, including strength and flexibility. Contact a health care provider if:  Your symptoms have not improved after 1-2 months of treatment and rest.  You cannot lift your arm away from your body. Summary  Shoulder impingement syndrome is a condition that causes pain when connective tissues (tendons) surrounding the shoulder joint become pinched.  The main symptom of this condition is pain on the front or side of the shoulder.  This condition is usually treated with rest, ice, and pain medicines as needed. This information is not intended to replace advice given to you by your health care provider. Make sure you discuss any questions you have with your health care provider. Document Released: 09/08/2005 Document Revised: 12/31/2018 Document Reviewed: 03/03/2018 Elsevier Patient Education  2020 [employer].

## 2019-09-19 ENCOUNTER — Other Ambulatory Visit (HOSPITAL_COMMUNITY)
Admission: RE | Admit: 2019-09-19 | Discharge: 2019-09-19 | Disposition: A | Discharge status: Home / Self Care | Payer: Medicare HMO | Source: Ambulatory Visit | Attending: Gastroenterology | Admitting: Gastroenterology

## 2019-09-19 ENCOUNTER — Other Ambulatory Visit: Payer: Self-pay

## 2019-09-19 ENCOUNTER — Other Ambulatory Visit (HOSPITAL_COMMUNITY): Payer: Medicare HMO

## 2019-09-19 DIAGNOSIS — Z20828 Contact with and (suspected) exposure to other viral communicable diseases: Secondary | ICD-10-CM | POA: Insufficient documentation

## 2019-09-19 DIAGNOSIS — Z01812 Encounter for preprocedural laboratory examination: Secondary | ICD-10-CM | POA: Diagnosis not present

## 2019-09-19 LAB — SARS CORONAVIRUS 2 (TAT 6-24 HRS): SARS Coronavirus 2: NEGATIVE

## 2019-09-20 ENCOUNTER — Telehealth: Payer: Self-pay

## 2019-09-20 NOTE — Telephone Encounter (Signed)
Called pt, TCS for tomorrow moved up to 2:00pm. Advised pt to arrive at 1:00pm. Start drinking 2nd half of prep tomorrow at 9:00am, NPO after 11:00am. Endo scheduler informed.

## 2019-09-21 ENCOUNTER — Encounter (HOSPITAL_COMMUNITY)
Admission: RE | Disposition: A | Discharge status: Home / Self Care | Payer: Self-pay | Source: Home / Self Care | Attending: Gastroenterology

## 2019-09-21 ENCOUNTER — Ambulatory Visit (HOSPITAL_COMMUNITY)
Admission: RE | Admit: 2019-09-21 | Discharge: 2019-09-21 | Disposition: A | Discharge status: Home / Self Care | Payer: Medicare HMO | Attending: Gastroenterology | Admitting: Gastroenterology

## 2019-09-21 ENCOUNTER — Encounter (HOSPITAL_COMMUNITY): Payer: Self-pay | Admitting: Gastroenterology

## 2019-09-21 ENCOUNTER — Other Ambulatory Visit: Payer: Self-pay

## 2019-09-21 DIAGNOSIS — K921 Melena: Secondary | ICD-10-CM | POA: Insufficient documentation

## 2019-09-21 DIAGNOSIS — K625 Hemorrhage of anus and rectum: Secondary | ICD-10-CM

## 2019-09-21 DIAGNOSIS — Q438 Other specified congenital malformations of intestine: Secondary | ICD-10-CM | POA: Diagnosis not present

## 2019-09-21 DIAGNOSIS — E119 Type 2 diabetes mellitus without complications: Secondary | ICD-10-CM | POA: Diagnosis not present

## 2019-09-21 DIAGNOSIS — K648 Other hemorrhoids: Secondary | ICD-10-CM | POA: Insufficient documentation

## 2019-09-21 DIAGNOSIS — K529 Noninfective gastroenteritis and colitis, unspecified: Secondary | ICD-10-CM | POA: Insufficient documentation

## 2019-09-21 DIAGNOSIS — I10 Essential (primary) hypertension: Secondary | ICD-10-CM | POA: Insufficient documentation

## 2019-09-21 DIAGNOSIS — E669 Obesity, unspecified: Secondary | ICD-10-CM | POA: Diagnosis not present

## 2019-09-21 DIAGNOSIS — E785 Hyperlipidemia, unspecified: Secondary | ICD-10-CM | POA: Insufficient documentation

## 2019-09-21 DIAGNOSIS — Z79899 Other long term (current) drug therapy: Secondary | ICD-10-CM | POA: Insufficient documentation

## 2019-09-21 DIAGNOSIS — Z885 Allergy status to narcotic agent status: Secondary | ICD-10-CM | POA: Diagnosis not present

## 2019-09-21 DIAGNOSIS — K5730 Diverticulosis of large intestine without perforation or abscess without bleeding: Secondary | ICD-10-CM | POA: Insufficient documentation

## 2019-09-21 DIAGNOSIS — K644 Residual hemorrhoidal skin tags: Secondary | ICD-10-CM | POA: Diagnosis not present

## 2019-09-21 DIAGNOSIS — Z7984 Long term (current) use of oral hypoglycemic drugs: Secondary | ICD-10-CM | POA: Insufficient documentation

## 2019-09-21 DIAGNOSIS — M47812 Spondylosis without myelopathy or radiculopathy, cervical region: Secondary | ICD-10-CM | POA: Insufficient documentation

## 2019-09-21 DIAGNOSIS — Z7982 Long term (current) use of aspirin: Secondary | ICD-10-CM | POA: Insufficient documentation

## 2019-09-21 HISTORY — PX: COLONOSCOPY: SHX5424

## 2019-09-21 LAB — GLUCOSE, CAPILLARY: Glucose-Capillary: 74 mg/dL (ref 70–99)

## 2019-09-21 SURGERY — COLONOSCOPY
Anesthesia: Moderate Sedation

## 2019-09-21 MED ORDER — MIDAZOLAM HCL 5 MG/5ML IJ SOLN
INTRAMUSCULAR | Status: DC | PRN
  Administered 2019-09-21: 2 mg via INTRAVENOUS
  Administered 2019-09-21 (×3): 1 mg via INTRAVENOUS

## 2019-09-21 MED ORDER — SODIUM CHLORIDE FLUSH 0.9 % IV SOLN
INTRAVENOUS | Status: AC
  Filled 2019-09-21: qty 10

## 2019-09-21 MED ORDER — DEXTROSE-NACL 5-0.9 % IV SOLN
INTRAVENOUS | Status: DC
  Administered 2019-09-21: 1000 mL via INTRAVENOUS

## 2019-09-21 MED ORDER — STERILE WATER FOR IRRIGATION IR SOLN
Status: DC | PRN
  Administered 2019-09-21: 4 mL

## 2019-09-21 MED ORDER — MEPERIDINE HCL 100 MG/ML IJ SOLN
INTRAMUSCULAR | Status: DC | PRN
  Administered 2019-09-21: 50 mg via INTRAVENOUS
  Administered 2019-09-21: 25 mg via INTRAVENOUS

## 2019-09-21 MED ORDER — PROMETHAZINE HCL 25 MG/ML IJ SOLN
INTRAMUSCULAR | Status: DC | PRN
  Administered 2019-09-21: 6.25 mg via INTRAVENOUS

## 2019-09-21 MED ORDER — MIDAZOLAM HCL 5 MG/5ML IJ SOLN
INTRAMUSCULAR | Status: AC
  Filled 2019-09-21: qty 5

## 2019-09-21 MED ORDER — SODIUM CHLORIDE 0.9 % IV SOLN
INTRAVENOUS | Status: DC
  Administered 2019-09-21: 1000 mL via INTRAVENOUS

## 2019-09-21 MED ORDER — PROMETHAZINE HCL 25 MG/ML IJ SOLN
INTRAMUSCULAR | Status: AC
  Filled 2019-09-21: qty 1

## 2019-09-21 MED ORDER — MEPERIDINE HCL 100 MG/ML IJ SOLN
INTRAMUSCULAR | Status: AC
  Filled 2019-09-21: qty 1

## 2019-09-21 NOTE — Discharge Instructions (Signed)
You DID NOT HAVE ANY POLYPS. YOU HAVE DIVERTICULOSIS IN YOUR LEFT COLON. You have  internal AND EXTERNAL hemorrhoids.   DRINK WATER TO KEEP URINE LIGHT YELLOW.  FOLLOW A HIGH FIBER DIET. AVOID ITEMS THAT CAUSE BLOATING & GAS. SEE INFO BELOW.  We do not routinely screen for polyps after the age of 53.   Colonoscopy Care After Read the instructions outlined below and refer to this sheet in the next week. These discharge instructions provide you with general information on caring for yourself after you leave the hospital. While your treatment has been planned according to the most current medical practices available, unavoidable complications occasionally occur. If you have any problems or questions after discharge, call DR. Laurene Melendrez, [phone].  ACTIVITY  You may resume your regular activity, but move at a slower pace for the next 24 hours.   Take frequent rest periods for the next 24 hours.   Walking will help get rid of the air and reduce the bloated feeling in your belly (abdomen).   No driving for 24 hours (because of the medicine (anesthesia) used during the test).   You may shower.   Do not sign any important legal documents or operate any machinery for 24 hours (because of the anesthesia used during the test).    NUTRITION  Drink plenty of fluids.   You may resume your normal diet as instructed by your doctor.   Begin with a light meal and progress to your normal diet. Heavy or fried foods are harder to digest and may make you feel sick to your stomach (nauseated).   Avoid alcoholic beverages for 24 hours or as instructed.    MEDICATIONS  You may resume your normal medications.   WHAT YOU CAN EXPECT TODAY  Some feelings of bloating in the abdomen.   Passage of more gas than usual.   Spotting of blood in your stool or on the toilet paper  .  IF YOU HAD POLYPS REMOVED DURING THE COLONOSCOPY:  Eat a soft diet IF YOU HAVE NAUSEA, BLOATING, ABDOMINAL PAIN, OR  VOMITING.    FINDING OUT THE RESULTS OF YOUR TEST Not all test results are available during your visit. DR. Oneida Alar WILL CALL YOU WITHIN 7 DAYS OF YOUR PROCEDUE WITH YOUR RESULTS. Do not assume everything is normal if you have not heard from DR. Marella Vanderpol IN ONE WEEK, CALL HER OFFICE AT [phone].  SEEK IMMEDIATE MEDICAL ATTENTION AND CALL THE OFFICE: [phone] IF:  You have more than a spotting of blood in your stool.   Your belly is swollen (abdominal distention).   You are nauseated or vomiting.   You have a temperature over 101F.   You have abdominal pain or discomfort that is severe or gets worse throughout the day.   High-Fiber Diet A high-fiber diet changes your normal diet to include more whole grains, legumes, fruits, and vegetables. Changes in the diet involve replacing refined carbohydrates with unrefined foods. The calorie level of the diet is essentially unchanged. The Dietary Reference Intake (recommended amount) for adult males is 38 grams per day. For adult females, it is 25 grams per day. Pregnant and lactating women should consume 28 grams of fiber per day. Fiber is the intact part of a plant that is not broken down during digestion. Functional fiber is fiber that has been isolated from the plant to provide a beneficial effect in the body.  PURPOSE  Increase stool bulk.   Ease and regulate bowel movements.   Lower  cholesterol.   REDUCE RISK OF COLON CANCER  INDICATIONS THAT YOU NEED MORE FIBER  Constipation and hemorrhoids.   Uncomplicated diverticulosis (intestine condition) and irritable bowel syndrome.   Weight management.   As a protective measure against hardening of the arteries (atherosclerosis), diabetes, and cancer.   GUIDELINES FOR INCREASING FIBER IN THE DIET  Start adding fiber to the diet slowly. A gradual increase of about 5 more grams (2 servings of most fruits or vegetables) per day is best. Too rapid an increase in fiber may result in  constipation, flatulence, and bloating.   Drink enough water and fluids to keep your urine clear or pale yellow. Water, juice, or caffeine-free drinks are recommended. Not drinking enough fluid may cause constipation.   Eat a variety of high-fiber foods rather than one type of fiber.   Try to increase your intake of fiber through using high-fiber foods rather than fiber pills or supplements that contain small amounts of fiber.   The goal is to change the types of food eaten. Do not supplement your present diet with high-fiber foods, but replace foods in your present diet.     Diverticulosis Diverticulosis is a common condition that develops when small pouches (diverticula) form in the wall of the colon. The risk of diverticulosis increases with age. It happens more often in people who eat a low-fiber diet. Most individuals with diverticulosis have no symptoms. Those individuals with symptoms usually experience belly (abdominal) pain, constipation, or loose stools (diarrhea).  HOME CARE INSTRUCTIONS  Increase the amount of fiber in your diet as directed by your caregiver or dietician. This may reduce symptoms of diverticulosis.   Drink at least 6 to 8 glasses of water each day to prevent constipation.   Try not to strain when you have a bowel movement.   THERE IS NO NEED TO Avoid nuts and seeds to prevent complications.   FOODS HAVING HIGH FIBER CONTENT INCLUDE:  Fruits. Apple, peach, pear, tangerine, raisins, prunes.   Vegetables. Brussels sprouts, asparagus, broccoli, cabbage, carrot, cauliflower, romaine lettuce, spinach, summer squash, tomato, winter squash, zucchini.   Starchy Vegetables. Baked beans, kidney beans, lima beans, split peas, lentils, potatoes (with skin).

## 2019-09-21 NOTE — H&P (Signed)
Primary Care Physician:  Fayrene Helper, MD Primary Gastroenterologist:  Dr. Oneida Alar  Pre-Procedure History & Physical: HPI:  Jillian [name] is a 78 y.o. female here for  BRBPR.  Past Medical History:  Diagnosis Date  . Atypical ductal hyperplasia of breast   . Chronic fatigue   . Diabetes mellitus 2011  . DJD (degenerative joint disease)    of neck    . Dyslipidemia   . 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 Hypertension 2005  . Mild obesity   . Postmenopausal   . Viral meningitis   . Wears glasses     Past Surgical History:  Procedure Laterality Date  . ABDOMINAL HYSTERECTOMY  1992   fibroids  . APPENDECTOMY    . BREAST EXCISIONAL BIOPSY Right    benign  . BREAST LUMPECTOMY WITH RADIOACTIVE SEED LOCALIZATION Right 10/05/2014   Procedure: BREAST LUMPECTOMY WITH RADIOACTIVE SEED LOCALIZATION;  Surgeon: Erroll Luna, MD;  Location: [facility];  Service: General;  Laterality: Right;  . cataract Bilateral   . COLONOSCOPY  2013   Dr. Oneida Alar :  internal hemorrhoids, mild left-sided diverticulosis  . CYST EXCISION Right    arm  . ESOPHAGOGASTRODUODENOSCOPY  2013   Dr. Oneida Alar: gastritis likely NSAID-induced. Duodenal polyp. Duodenitis and gastritis in setting of aspirin and Ibuprofen.   Marland Kitchen EYE SURGERY Right 06/07/2013   cataract  . EYE SURGERY Left 09/14/2013   cataract  . KNEE ARTHROSCOPY Right 02/26/2016   Procedure: ARTHROSCOPY RIGHT KNEE WITH MENSICAL DEBRIDEMENT;  Surgeon: Gaynelle Arabian, MD;  Location: [facility];  Service: Orthopedics;  Laterality: Right;  . left breast biopsy    . LESION REMOVAL Right 03/30/2013   Procedure: EXCISION OF NEOPLASM ARM;  Surgeon: Jamesetta So, MD;  Location: [facility];  Service: General;  Laterality: Right;  . TUBAL LIGATION      Prior to Admission medications   Medication Sig Start Date End Date Taking? Authorizing Provider  aspirin EC 81 MG tablet Take 1 tablet  (81 mg total) by mouth every evening. Patient taking differently: Take 81 mg by mouth at bedtime.  04/23/16  Yes Fayrene Helper, MD  benazepril-hydrochlorthiazide (LOTENSIN HCT) 10-12.5 MG tablet Take 1 tablet by mouth daily. 07/18/19  Yes Fayrene Helper, MD  cloNIDine (CATAPRES) 0.2 MG tablet Take one tablet once daily at bedtime for blood pressure Patient taking differently: Take 0.2 mg by mouth at bedtime.  04/18/19  Yes Fayrene Helper, MD  ergocalciferol (VITAMIN D2) 1.25 MG (50000 UT) capsule Take 1 capsule (50,000 Units total) by mouth once a week. Patient taking differently: Take 50,000 Units by mouth once a week. Saturday 09/01/19  Yes Lockamy, Randi L, NP-C  ferrous sulfate 325 (65 FE) MG EC tablet Take 1 tablet (325 mg total) by mouth daily. Patient taking differently: Take 325 mg by mouth at bedtime.  08/09/19  Yes Lockamy, Randi L, NP-C  metFORMIN (GLUCOPHAGE-XR) 500 MG 24 hr tablet TAKE 1 TABLET TWICE DAILY Patient taking differently: Take 500 mg by mouth at bedtime.  09/01/19  Yes Fayrene Helper, MD  pantoprazole (PROTONIX) 40 MG tablet Take 1 tablet (40 mg total) by mouth daily. Take 30 minutes before breakfast Patient taking differently: Take 40 mg by mouth at bedtime. Take 30 minutes before breakfast 06/14/19  Yes Annitta Needs, NP  tamoxifen (NOLVADEX) 20 MG tablet TAKE 1 TABLET (20 MG TOTAL) BY MOUTH DAILY.  Patient taking differently: Take 20 mg by mouth at bedtime.  07/14/19  Yes Lockamy, Randi L, NP-C  vitamin B-12 (CYANOCOBALAMIN) 1000 MCG tablet Take 1,000 mcg by mouth every Monday, Wednesday, and Friday.   Yes [provider]  acetaminophen (TYLENOL) 500 MG tablet Take 1,000 mg by mouth every 6 (six) hours as needed for mild pain.    [provider]  cyclobenzaprine (FLEXERIL) 10 MG tablet TAKE 1 TABLET BY MOUTH AT BEDTIME AS NEEDED FOR BACK SPASM Patient taking differently: Take 10 mg by mouth at bedtime as needed for muscle spasms.   05/23/19   Fayrene Helper, MD  glucose blood test strip Use as instructed to check blood glucose once daily 09/08/19   Fayrene Helper, MD  ibuprofen (ADVIL) 800 MG tablet Take 1 tablet (800 mg total) by mouth every 8 (eight) hours as needed. Patient taking differently: Take 800 mg by mouth every 8 (eight) hours as needed for mild pain or moderate pain.  04/18/19   Fayrene Helper, MD  pravastatin (PRAVACHOL) 10 MG tablet TAKE 1 TABLET EVERY DAY 09/19/19   Fayrene Helper, MD    Allergies as of 06/14/2019 - Review Complete 06/14/2019  Allergen Reaction Noted  . Codeine Nausea And Vomiting 12/22/2011  . Propoxyphene n-acetaminophen Other (See Comments) 08/10/2008    Family History  Problem Relation Age of Onset  . Hypertension Father   . Congestive Heart Failure Father   . Liver cancer Mother   . Hypertension Sister   . Hypertension Brother   . Diabetes Brother   . Colon cancer Neg Hx   . Colon polyps Neg Hx     Social History   Socioeconomic History  . Marital status: Widowed    Spouse name: Not on file  . Number of children: Not on file  . Years of education: Not on file  . Highest education level: Not on file  Occupational History  . Not on file  Tobacco Use  . Smoking status: Never Smoker  . Smokeless tobacco: Never Used  Substance and Sexual Activity  . Alcohol use: No  . Drug use: No  . Sexual activity: Not Currently    Birth control/protection: Surgical  Other Topics Concern  . Not on file  Social History Narrative  . Not on file   Social Determinants of Health   Financial Resource Strain:   . Difficulty of Paying Living Expenses: Not on file  Food Insecurity:   . Worried About Charity fundraiser in the Last Year: Not on file  . Ran Out of Food in the Last Year: Not on file  Transportation Needs:   . Lack of Transportation (Medical): Not on file  . Lack of Transportation (Non-Medical): Not on file  Physical Activity:   . Days of  Exercise per Week: Not on file  . Minutes of Exercise per Session: Not on file  Stress:   . Feeling of Stress : Not on file  Social Connections:   . Frequency of Communication with Friends and Family: Not on file  . Frequency of Social Gatherings with Friends and Family: Not on file  . Attends Religious Services: Not on file  . Active Member of Clubs or Organizations: Not on file  . Attends Archivist Meetings: Not on file  . Marital Status: Not on file  Intimate Partner Violence:   . Fear of Current or Ex-Partner: Not on file  . Emotionally Abused: Not on file  .  Physically Abused: Not on file  . Sexually Abused: Not on file    Review of Systems: See HPI, otherwise negative ROS   Physical Exam: BP 133/69   Pulse 71   Temp 98.6 F (37 C) (Oral)   Resp 15   Ht 5\' 4"  (1.626 m)   Wt 84.4 kg   SpO2 100%   BMI 31.93 kg/m  General:   Alert,  pleasant and cooperative in NAD Head:  Normocephalic and atraumatic. Neck:  Supple; Lungs:  Clear throughout to auscultation.    Heart:  Regular rate and rhythm. Abdomen:  Soft, nontender and nondistended. Normal bowel sounds, without guarding, and without rebound.   Neurologic:  Alert and  oriented x4;  grossly normal neurologically.  Impression/Plan:    BRBPR  PLAN: TCS TODAY. DISCUSSED PROCEDURE, BENEFITS, & RISKS: < 1% chance of medication reaction, bleeding, perforation, ASPIRATION, or rupture of spleen/liver requiring surgery to fix it and missed polyps < 1 cm 10-20% of the time.

## 2019-09-21 NOTE — Op Note (Signed)
[facility] Patient Name: Jillian [name] Procedure Date: 09/21/2019 1:41 PM MRN: [id number] Date of Birth: 1941/01/27 Attending MD: Barney Drain MD, MD CSN: [id number] Age: 78 Admit Type: Outpatient Procedure:                Colonoscopy, DIAGNOSTIC Indications:              Hematochezia, Abnormal CT of the GI tract Providers:                Barney Drain MD, MD, Hinton Rao, RN, Aram Candela Referring MD:             Norwood Levo. Simpson MD, MD Medicines:                Promethazine 6.25 mg IV, Meperidine 75 mg IV,                            Midazolam 5 mg IV Complications:            No immediate complications. Estimated Blood Loss:     Estimated blood loss: none. Procedure:                Pre-Anesthesia Assessment:                           - Prior to the procedure, a History and Physical                            was performed, and patient medications and                            allergies were reviewed. The patient's tolerance of                            previous anesthesia was also reviewed. The risks                            and benefits of the procedure and the sedation                            options and risks were discussed with the patient.                            All questions were answered, and informed consent                            was obtained. Prior Anticoagulants: The patient has                            taken no previous anticoagulant or antiplatelet                            agents except for aspirin. ASA Grade Assessment: II                            - A patient with mild systemic disease. After  reviewing the risks and benefits, the patient was                            deemed in satisfactory condition to undergo the                            procedure. After obtaining informed consent, the                            colonoscope was passed under direct vision.                            Throughout the  procedure, the patient's blood                            pressure, pulse, and oxygen saturations were                            monitored continuously. The [id number]                            scope was introduced through the anus and advanced                            to the 8 cm into the ileum. The colonoscopy was                            technically difficult and complex due to restricted                            mobility of the colon, a redundant colon and the                            patient's agitation. Successful completion of the                            procedure was aided by increasing the dose of                            sedation medication, changing endoscopes,                            straightening and shortening the scope to obtain                            bowel loop reduction and COLOWRAP. The patient                            tolerated the procedure fairly well. The quality of                            the bowel preparation was good. The terminal ileum,  ileocecal valve, appendiceal orifice, and rectum                            were photographed. Scope In: 2:34:17 PM Scope Out: 2:53:37 PM Scope Withdrawal Time: 0 hours 13 minutes 35 seconds  Total Procedure Duration: 0 hours 19 minutes 20 seconds  Findings:      The terminal ileum appeared normal.      Multiple small and large-mouthed diverticula were found in the       recto-sigmoid colon, sigmoid colon, descending colon and splenic flexure.      External and internal hemorrhoids were found.      The recto-sigmoid colon, sigmoid colon, descending colon and splenic       flexure were grossly tortuous. Impression:               - ABNORMAL CT FINDINGS HAVE RESOLVED                           - Diverticulosis in the recto-sigmoid colon, in the                            sigmoid colon, in the descending colon and at the                            splenic flexure.                            - RECTAL BLEEDING DUE TO COLITIS/Internal                            hemorrhoids.                           - Tortuous colon. Moderate Sedation:      Moderate (conscious) sedation was administered by the endoscopy nurse       and supervised by the endoscopist. The following parameters were       monitored: oxygen saturation, heart rate, blood pressure, and response       to care. Total physician intraservice time was 44 minutes. Recommendation:           - Patient has a contact number available for                            emergencies. The signs and symptoms of potential                            delayed complications were discussed with the                            patient. Return to normal activities tomorrow.                            Written discharge instructions were provided to the                            patient.                           -  High fiber diet.                           - Continue present medications.                           - Repeat colonoscopy is not recommended due to                            current age (60 years or older) for surveillance. Procedure Code(s):        --- Professional ---                           [phone], Colonoscopy, flexible; diagnostic, including                            collection of specimen(s) by brushing or washing,                            when performed (separate procedure)                           99153, Moderate sedation; each additional 15                            minutes intraservice time                           99153, Moderate sedation; each additional 15                            minutes intraservice time                           G0500, Moderate sedation services provided by the                            same physician or other qualified health care                            professional performing a gastrointestinal                            endoscopic service that sedation supports,                             requiring the presence of an independent trained                            observer to assist in the monitoring of the                            patient's level of consciousness and physiological                            status; initial 15 minutes of intra-service  time;                            patient age 52 years or older (additional time may                            be reported with [phone], as appropriate) Diagnosis Code(s):        --- Professional ---                           K64.8, Other hemorrhoids                           K92.1, Melena (includes Hematochezia)                           K57.30, Diverticulosis of large intestine without                            perforation or abscess without bleeding                           R93.3, Abnormal findings on diagnostic imaging of                            other parts of digestive tract                           Q43.8, Other specified congenital malformations of                            intestine CPT copyright 2019 [employer]. All rights reserved. The codes documented in this report are preliminary and upon coder review may  be revised to meet current compliance requirements. Barney Drain, MD Barney Drain MD, MD 09/21/2019 3:13:04 PM This report has been signed electronically. Number of Addenda: 0

## 2019-09-26 ENCOUNTER — Telehealth: Payer: Self-pay | Admitting: *Deleted

## 2019-09-26 NOTE — Telephone Encounter (Signed)
Pt was calling said that they were offering her the covid vaccine and wanted to know what dr simpson thought about her getting it.

## 2019-09-26 NOTE — Telephone Encounter (Signed)
Called back and left a message that it was recommended

## 2019-09-27 DIAGNOSIS — H00021 Hordeolum internum right upper eyelid: Secondary | ICD-10-CM | POA: Diagnosis not present

## 2019-10-28 ENCOUNTER — Other Ambulatory Visit: Payer: Self-pay

## 2019-10-28 ENCOUNTER — Ambulatory Visit
Admission: EM | Admit: 2019-10-28 | Discharge: 2019-10-28 | Disposition: A | Discharge status: Home / Self Care | Payer: Medicare HMO | Attending: Emergency Medicine | Admitting: Emergency Medicine

## 2019-10-28 DIAGNOSIS — R1032 Left lower quadrant pain: Secondary | ICD-10-CM | POA: Diagnosis not present

## 2019-10-28 DIAGNOSIS — Z8719 Personal history of other diseases of the digestive system: Secondary | ICD-10-CM

## 2019-10-28 MED ORDER — AMOXICILLIN-POT CLAVULANATE 875-125 MG PO TABS: 1.0000 | ORAL_TABLET | Freq: Two times a day (BID) | ORAL | 0 refills | Status: AC

## 2019-10-28 MED ORDER — ONDANSETRON HCL 4 MG PO TABS: 4.0000 mg | ORAL_TABLET | Freq: Four times a day (QID) | ORAL | 0 refills | Status: DC

## 2019-10-28 NOTE — ED Triage Notes (Signed)
Pt presents to UC w/ c/o mid upper abd pain since this morning. Pt states she vomited once 3 hours ago.  Pt states she received her 2nd covid vaccine dose.

## 2019-10-28 NOTE — Discharge Instructions (Addendum)
Offered further work-up in the ED.  Declines at this time.  Would like to try outpatient therapy first.  Given strict ED precautions Unable to produce urine sample.   Based on symptoms and history we will treat you for possible diverticulitis flare Antibiotics sent into pharmacy on file.  Take as directed and to completion Get rest and push fluids.   Zofran prescribed.  Take as needed for nausea and/or vomiting Follow up with PCP as needed If you experience new or worsening symptoms go to ER such as fever, chills, nausea, vomiting, diarrhea, bloody or dark tarry stools, constipation, urinary symptoms, worsening abdominal discomfort, symptoms that do not improve with medications, inability to keep fluids down, etc..Marland Kitchen

## 2019-10-28 NOTE — ED Provider Notes (Signed)
[place]   [id number] 10/28/19 Arrival Time: 59  CC: ABDOMINAL DISCOMFORT  SUBJECTIVE:  Jillian [name] is a 79 y.o. female who presents with complaint of abdominal discomfort that began gradually this morning.  Received second dose of moderna vaccine today.  Denies a precipitating event, trauma, close contacts with similar symptoms, recent travel antibiotic use, or medication changes.  Localizes pain to upper abdomen.  Describes as constant, improving, and sharp in character.  States pain was 10/10 earlier but improved to 6/10.  Has tried OTC tylenol without relief.  Denies alleviating or aggravating factors. Denies worsening factors with eating, or using the bathroom.  Reports similar symptoms in the past with diverticulitis.  Last BM this morning and normal for patient, denies hematochezia or melena.  Complains of nausea, and two episodes of vomiting, 3 hours ago now resolved.  Complains of associated chills, and decreased appetite  Denies fever, appetite changes, chest pain, SOB, diarrhea, constipation, hematochezia, melena, dysuria, difficulty urinating, increased frequency or urgency, flank pain, loss of bowel or bladder function, vaginal discharge, vaginal odor, vaginal bleeding, dyspareunia, pelvic pain.     No LMP recorded. Patient has had a hysterectomy. Complete hysterctomy.  appendectomy.  Normal colonscopy earlier this year without "polyps or cancer."    Hx significant for DM; average blood glucose: 109  ROS: As per HPI.  All other pertinent ROS negative.     Past Medical History:  Diagnosis Date  . Atypical ductal hyperplasia of breast   . Chronic fatigue   . Diabetes mellitus 2011  . DJD (degenerative joint disease)    of neck    . Dyslipidemia   . 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 Hypertension 2005  . Mild obesity   . Postmenopausal   . Viral meningitis   . Wears glasses    Past Surgical  History:  Procedure Laterality Date  . ABDOMINAL HYSTERECTOMY  1992   fibroids  . APPENDECTOMY    . BREAST EXCISIONAL BIOPSY Right    benign  . BREAST LUMPECTOMY WITH RADIOACTIVE SEED LOCALIZATION Right 10/05/2014   Procedure: BREAST LUMPECTOMY WITH RADIOACTIVE SEED LOCALIZATION;  Surgeon: Erroll Luna, MD;  Location: [facility];  Service: General;  Laterality: Right;  . cataract Bilateral   . COLONOSCOPY  2013   Dr. Oneida Alar :  internal hemorrhoids, mild left-sided diverticulosis  . COLONOSCOPY N/A 09/21/2019   Procedure: COLONOSCOPY;  Surgeon: Danie Binder, MD;  Location: [facility];  Service: Endoscopy;  Laterality: N/A;  12:00pm  . CYST EXCISION Right    arm  . ESOPHAGOGASTRODUODENOSCOPY  2013   Dr. Oneida Alar: gastritis likely NSAID-induced. Duodenal polyp. Duodenitis and gastritis in setting of aspirin and Ibuprofen.   Marland Kitchen EYE SURGERY Right 06/07/2013   cataract  . EYE SURGERY Left 09/14/2013   cataract  . KNEE ARTHROSCOPY Right 02/26/2016   Procedure: ARTHROSCOPY RIGHT KNEE WITH MENSICAL DEBRIDEMENT;  Surgeon: Gaynelle Arabian, MD;  Location: [facility];  Service: Orthopedics;  Laterality: Right;  . left breast biopsy    . LESION REMOVAL Right 03/30/2013   Procedure: EXCISION OF NEOPLASM ARM;  Surgeon: Jamesetta So, MD;  Location: [facility];  Service: General;  Laterality: Right;  . TUBAL LIGATION     Allergies  Allergen Reactions  . Codeine Nausea And Vomiting  . Propoxyphene N-Acetaminophen Other (See Comments)    hallucinations   No current facility-administered medications on file prior to  encounter.   Current Outpatient Medications on File Prior to Encounter  Medication Sig Dispense Refill  . acetaminophen (TYLENOL) 500 MG tablet Take 1,000 mg by mouth every 6 (six) hours as needed for mild pain.    Marland Kitchen aspirin EC 81 MG tablet Take 1 tablet (81 mg total) by mouth every evening. (Patient taking differently: Take 81 mg by mouth at bedtime. ) 90 tablet 1  .  benazepril-hydrochlorthiazide (LOTENSIN HCT) 10-12.5 MG tablet Take 1 tablet by mouth daily. 90 tablet 1  . cloNIDine (CATAPRES) 0.2 MG tablet Take one tablet once daily at bedtime for blood pressure (Patient taking differently: Take 0.2 mg by mouth at bedtime. ) 90 tablet 3  . cyclobenzaprine (FLEXERIL) 10 MG tablet TAKE 1 TABLET BY MOUTH AT BEDTIME AS NEEDED FOR BACK SPASM (Patient taking differently: Take 10 mg by mouth at bedtime as needed for muscle spasms. ) 20 tablet 0  . ergocalciferol (VITAMIN D2) 1.25 MG (50000 UT) capsule Take 1 capsule (50,000 Units total) by mouth once a week. (Patient taking differently: Take 50,000 Units by mouth once a week. Saturday) 16 capsule 4  . ferrous sulfate 325 (65 FE) MG EC tablet Take 1 tablet (325 mg total) by mouth daily. (Patient taking differently: Take 325 mg by mouth at bedtime. ) 30 tablet 3  . glucose blood test strip Use as instructed to check blood glucose once daily 100 each 0  . ibuprofen (ADVIL) 800 MG tablet Take 1 tablet (800 mg total) by mouth every 8 (eight) hours as needed. (Patient taking differently: Take 800 mg by mouth every 8 (eight) hours as needed for mild pain or moderate pain. ) 30 tablet 0  . metFORMIN (GLUCOPHAGE-XR) 500 MG 24 hr tablet TAKE 1 TABLET TWICE DAILY (Patient taking differently: Take 500 mg by mouth at bedtime. ) 180 tablet 0  . pantoprazole (PROTONIX) 40 MG tablet Take 1 tablet (40 mg total) by mouth daily. Take 30 minutes before breakfast (Patient taking differently: Take 40 mg by mouth at bedtime. Take 30 minutes before breakfast) 90 tablet 3  . pravastatin (PRAVACHOL) 10 MG tablet TAKE 1 TABLET EVERY DAY 90 tablet 0  . tamoxifen (NOLVADEX) 20 MG tablet TAKE 1 TABLET (20 MG TOTAL) BY MOUTH DAILY. (Patient taking differently: Take 20 mg by mouth at bedtime. ) 90 tablet 4  . vitamin B-12 (CYANOCOBALAMIN) 1000 MCG tablet Take 1,000 mcg by mouth every Monday, Wednesday, and Friday.     Social History   Socioeconomic  History  . Marital status: Widowed    Spouse name: Not on file  . Number of children: Not on file  . Years of education: Not on file  . Highest education level: Not on file  Occupational History  . Not on file  Tobacco Use  . Smoking status: Never Smoker  . Smokeless tobacco: Never Used  Substance and Sexual Activity  . Alcohol use: No  . Drug use: No  . Sexual activity: Not Currently    Birth control/protection: Surgical  Other Topics Concern  . Not on file  Social History Narrative  . Not on file   Social Determinants of Health   Financial Resource Strain:   . Difficulty of Paying Living Expenses: Not on file  Food Insecurity:   . Worried About Charity fundraiser in the Last Year: Not on file  . Ran Out of Food in the Last Year: Not on file  Transportation Needs:   . Lack of Transportation (Medical): Not  on file  . Lack of Transportation (Non-Medical): Not on file  Physical Activity:   . Days of Exercise per Week: Not on file  . Minutes of Exercise per Session: Not on file  Stress:   . Feeling of Stress : Not on file  Social Connections:   . Frequency of Communication with Friends and Family: Not on file  . Frequency of Social Gatherings with Friends and Family: Not on file  . Attends Religious Services: Not on file  . Active Member of Clubs or Organizations: Not on file  . Attends Archivist Meetings: Not on file  . Marital Status: Not on file  Intimate Partner Violence:   . Fear of Current or Ex-Partner: Not on file  . Emotionally Abused: Not on file  . Physically Abused: Not on file  . Sexually Abused: Not on file   Family History  Problem Relation Age of Onset  . Hypertension Father   . Congestive Heart Failure Father   . Liver cancer Mother   . Hypertension Sister   . Hypertension Brother   . Diabetes Brother   . Colon cancer Neg Hx   . Colon polyps Neg Hx      OBJECTIVE:  Vitals:   10/28/19 1733 10/28/19 1812  BP: 121/73   Pulse:  (!) 122 (!) 105  Resp: 17   Temp: 98.2 F (36.8 C)   TempSrc: Oral   SpO2: 96%     General appearance: Alert; NAD HEENT: NCAT.  PERRL, EOMI grossly; Oropharynx clear.  Lungs: clear to auscultation bilaterally without adventitious breath sounds Heart: tachycardia Abdomen: soft, non-distended; normal active bowel sounds; TTP over LLQ and suprapubic regions; nontender at McBurney's point; negative Murphy's sign; negative rebound; minimal guarding Extremities: no edema; symmetrical with no gross deformities Skin: warm and dry Neurologic: normal gait Psychological: alert and cooperative; normal mood and affect   ASSESSMENT & PLAN:  1. LLQ discomfort   2. Hx of diverticulitis of colon     Meds ordered this encounter  Medications  . amoxicillin-clavulanate (AUGMENTIN) 875-125 MG tablet    Sig: Take 1 tablet by mouth every 12 (twelve) hours for 10 days.    Dispense:  20 tablet    Refill:  0    Order Specific Question:   Supervising Provider    Answer:   Raylene Everts [id number]  . ondansetron (ZOFRAN) 4 MG tablet    Sig: Take 1 tablet (4 mg total) by mouth every 6 (six) hours.    Dispense:  12 tablet    Refill:  0    Order Specific Question:   Supervising Provider    Answer:   Raylene Everts [id number]    Offered further work-up in the ED.  Declines at this time.  Would like to try outpatient therapy first.  Given strict ED precautions Unable to produce urine sample.   Based on symptoms and history we will treat you for possible diverticulitis flare Antibiotic sent into pharmacy on file.  Take as directed and to completion Get rest and push fluids.   Zofran prescribed.  Take as needed for nausea and/or vomiting Follow up with PCP as needed If you experience new or worsening symptoms go to ER such as fever, chills, nausea, vomiting, diarrhea, bloody or dark tarry stools, constipation, urinary symptoms, worsening abdominal discomfort, symptoms that do not improve with  medications, inability to keep fluids down, etc...   Reviewed expectations re: course of current medical issues. Questions answered. Outlined signs  and symptoms indicating need for more acute intervention. Patient verbalized understanding. After Visit Summary given.   Lestine Box, PA-C 10/28/19 [id number]

## 2019-10-31 ENCOUNTER — Other Ambulatory Visit (HOSPITAL_COMMUNITY): Payer: Self-pay | Admitting: Nurse Practitioner

## 2019-10-31 DIAGNOSIS — D649 Anemia, unspecified: Secondary | ICD-10-CM

## 2019-11-02 ENCOUNTER — Inpatient Hospital Stay (HOSPITAL_COMMUNITY): Payer: Medicare HMO | Attending: Hematology

## 2019-11-02 ENCOUNTER — Other Ambulatory Visit: Payer: Self-pay

## 2019-11-02 DIAGNOSIS — D649 Anemia, unspecified: Secondary | ICD-10-CM

## 2019-11-02 DIAGNOSIS — D241 Benign neoplasm of right breast: Secondary | ICD-10-CM

## 2019-11-02 DIAGNOSIS — R61 Generalized hyperhidrosis: Secondary | ICD-10-CM | POA: Diagnosis not present

## 2019-11-02 DIAGNOSIS — D509 Iron deficiency anemia, unspecified: Secondary | ICD-10-CM | POA: Insufficient documentation

## 2019-11-02 DIAGNOSIS — Z79810 Long term (current) use of selective estrogen receptor modulators (SERMs): Secondary | ICD-10-CM | POA: Insufficient documentation

## 2019-11-02 DIAGNOSIS — R7989 Other specified abnormal findings of blood chemistry: Secondary | ICD-10-CM | POA: Diagnosis not present

## 2019-11-02 DIAGNOSIS — E559 Vitamin D deficiency, unspecified: Secondary | ICD-10-CM | POA: Insufficient documentation

## 2019-11-02 LAB — CBC WITH DIFFERENTIAL/PLATELET
Abs Immature Granulocytes: 0.05 10*3/uL (ref 0.00–0.07)
Basophils Absolute: 0 10*3/uL (ref 0.0–0.1)
Basophils Relative: 0 %
Eosinophils Absolute: 0 10*3/uL (ref 0.0–0.5)
Eosinophils Relative: 0 %
HCT: 37.3 % (ref 36.0–46.0)
Hemoglobin: 11.5 g/dL — ABNORMAL LOW (ref 12.0–15.0)
Immature Granulocytes: 1 %
Lymphocytes Relative: 32 %
Lymphs Abs: 2.2 10*3/uL (ref 0.7–4.0)
MCH: 30.3 pg (ref 26.0–34.0)
MCHC: 30.8 g/dL (ref 30.0–36.0)
MCV: 98.2 fL (ref 80.0–100.0)
Monocytes Absolute: 0.3 10*3/uL (ref 0.1–1.0)
Monocytes Relative: 5 %
Neutro Abs: 4.2 10*3/uL (ref 1.7–7.7)
Neutrophils Relative %: 62 %
Platelets: 190 10*3/uL (ref 150–400)
RBC: 3.8 MIL/uL — ABNORMAL LOW (ref 3.87–5.11)
RDW: 13.9 % (ref 11.5–15.5)
WBC: 6.9 10*3/uL (ref 4.0–10.5)
nRBC: 0 % (ref 0.0–0.2)

## 2019-11-02 LAB — COMPREHENSIVE METABOLIC PANEL
ALT: 16 U/L (ref 0–44)
AST: 17 U/L (ref 15–41)
Albumin: 3.7 g/dL (ref 3.5–5.0)
Alkaline Phosphatase: 51 U/L (ref 38–126)
Anion gap: 7 (ref 5–15)
BUN: 15 mg/dL (ref 8–23)
CO2: 29 mmol/L (ref 22–32)
Calcium: 9 mg/dL (ref 8.9–10.3)
Chloride: 105 mmol/L (ref 98–111)
Creatinine, Ser: 1.13 mg/dL — ABNORMAL HIGH (ref 0.44–1.00)
GFR calc Af Amer: 54 mL/min — ABNORMAL LOW (ref >60)
GFR calc non Af Amer: 47 mL/min — ABNORMAL LOW (ref >60)
Glucose, Bld: 136 mg/dL — ABNORMAL HIGH (ref 70–99)
Potassium: 3.4 mmol/L — ABNORMAL LOW (ref 3.5–5.1)
Sodium: 141 mmol/L (ref 135–145)
Total Bilirubin: 0.5 mg/dL (ref 0.3–1.2)
Total Protein: 7.2 g/dL (ref 6.5–8.1)

## 2019-11-02 LAB — IRON AND TIBC
Iron: 50 ug/dL (ref 28–170)
Saturation Ratios: 15 % (ref 10.4–31.8)
TIBC: 326 ug/dL (ref 250–450)
UIBC: 276 ug/dL

## 2019-11-02 LAB — VITAMIN D 25 HYDROXY (VIT D DEFICIENCY, FRACTURES): Vit D, 25-Hydroxy: 46.05 ng/mL (ref 30–100)

## 2019-11-02 LAB — LACTATE DEHYDROGENASE: LDH: 128 U/L (ref 98–192)

## 2019-11-02 LAB — FERRITIN: Ferritin: 91 ng/mL (ref 11–307)

## 2019-11-02 LAB — VITAMIN B12: Vitamin B-12: 1014 pg/mL — ABNORMAL HIGH (ref 180–914)

## 2019-11-03 LAB — KAPPA/LAMBDA LIGHT CHAINS
Kappa free light chain: 24 mg/L — ABNORMAL HIGH (ref 3.3–19.4)
Kappa, lambda light chain ratio: 1.17 (ref 0.26–1.65)
Lambda free light chains: 20.5 mg/L (ref 5.7–26.3)

## 2019-11-03 LAB — PROTEIN ELECTROPHORESIS, SERUM
A/G Ratio: 1.3 (ref 0.7–1.7)
Albumin ELP: 3.7 g/dL (ref 2.9–4.4)
Alpha-1-Globulin: 0.2 g/dL (ref 0.0–0.4)
Alpha-2-Globulin: 0.8 g/dL (ref 0.4–1.0)
Beta Globulin: 1.3 g/dL (ref 0.7–1.3)
Gamma Globulin: 0.7 g/dL (ref 0.4–1.8)
Globulin, Total: 2.9 g/dL (ref 2.2–3.9)
Total Protein ELP: 6.6 g/dL (ref 6.0–8.5)

## 2019-11-09 ENCOUNTER — Encounter (HOSPITAL_COMMUNITY): Payer: Self-pay | Admitting: Nurse Practitioner

## 2019-11-09 ENCOUNTER — Other Ambulatory Visit: Payer: Self-pay

## 2019-11-09 ENCOUNTER — Inpatient Hospital Stay (HOSPITAL_COMMUNITY): Payer: Medicare HMO | Admitting: Nurse Practitioner

## 2019-11-09 VITALS — BP 91/48 | HR 80 | Temp 97.1°F | Resp 20

## 2019-11-09 DIAGNOSIS — D241 Benign neoplasm of right breast: Secondary | ICD-10-CM

## 2019-11-09 DIAGNOSIS — D509 Iron deficiency anemia, unspecified: Secondary | ICD-10-CM | POA: Diagnosis not present

## 2019-11-09 DIAGNOSIS — Z79810 Long term (current) use of selective estrogen receptor modulators (SERMs): Secondary | ICD-10-CM | POA: Diagnosis not present

## 2019-11-09 DIAGNOSIS — E559 Vitamin D deficiency, unspecified: Secondary | ICD-10-CM | POA: Diagnosis not present

## 2019-11-09 DIAGNOSIS — Z1231 Encounter for screening mammogram for malignant neoplasm of breast: Secondary | ICD-10-CM | POA: Diagnosis not present

## 2019-11-09 DIAGNOSIS — R7989 Other specified abnormal findings of blood chemistry: Secondary | ICD-10-CM | POA: Diagnosis not present

## 2019-11-09 DIAGNOSIS — R61 Generalized hyperhidrosis: Secondary | ICD-10-CM | POA: Diagnosis not present

## 2019-11-09 NOTE — Patient Instructions (Addendum)
[facility]  Discharge Instructions: Follow up in 6 months with labs   You saw Randi Lockamy, NP, today. _______________________________________________________________  Thank you for choosing [facility] to provide your oncology and hematology care.  To afford each patient quality time with our providers, please arrive at least 15 minutes before your scheduled appointment.  You need to re-schedule your appointment if you arrive 10 or more minutes late.  We strive to give you quality time with our providers, and arriving late affects you and other patients whose appointments are after yours.  Also, if you no show three or more times for appointments you may be dismissed from the clinic.  Again, thank you for choosing  [facility]. Our hope is that these requests will allow you access to exceptional care and in a timely manner. _______________________________________________________________  If you have questions after your visit, please contact our office at [phone] between the hours of 8:30 a.m. and 5:00 p.m. Voicemails left after 4:30 p.m. will not be returned until the following business day. _______________________________________________________________  For prescription refill requests, have your pharmacy contact our office. _______________________________________________________________  Recommendations made by the consultant and any test results will be sent to your referring physician. _______________________________________________________________ 

## 2019-11-09 NOTE — Assessment & Plan Note (Signed)
1.  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 - Patient started tamoxifen early 2016.  She is tolerating it very well.   -She denies any vaginal bleeding or spotting.  Her mobility is limited from the sequelae of viral meningitis in August. - Her last mammogram was 01/31/2019 which was B RADS category 1 negative.  We reviewed the results today. - Physical examination today shows right breast lower inner quadrant scar with no palpable masses, no palpable adenopathy. -We will discontinue the tamoxifen at her next visit. -Labs done on 11/02/2019 showed hemoglobin 11.5, WBC 6.9, platelets 190, creatinine 1.13, potassium 3.4.  LDH 128 - She will follow-up in 6 months with repeat labs.  2.  Normocytic anemia: - Labs on 11/02/2019 showed her hemoglobin at 11.5, ferritin 91, percent saturation 15.   -She continues to take oral iron daily. - We will continue to monitor.  3.  Vitamin B-12 deficiency: -Labs on 01/31/2019 showed her B12 level at 170. -She will start on oral B12 tablets daily -Repeat labs on 08/02/2019 showed her vitamin B12 level 2330. -She will start taking her B12 every other day. -Repeat labs on 11/02/2019 showed her vitamin B12 level is 1014 -She will continue taking B12 every other day. -We will recheck her labs on her next visit.  4.  Night sweats: -Patient reports the tamoxifen has never bothered her as far as hot flashes or sweats.  Over the past month she is starting to have have drenching sweats at night.  She reports she is more fatigued. -We did a work-up which showed SPEP normal.  Flow cytometry normal.  Light chains normal.  BCR ABL pending -Patient reports her night sweats have improved.  5.  Increased creatinine: -Baseline creatinine is 0.86-1.05 -Labs on 08/02/2019 showed her creatinine 1.62. -Patient reports she has not been drinking as much fluids.  She will increase her fluid  intake -Repeat labs on 11/02/2019 showed an improvement in her creatinine to 1.13. -We will recheck her next visit.  6.  Vitamin D deficiency: -Labs on 08/02/2019 showed vitamin D level 19.88 -I prescribed vit D 50,000 units weekly. -Repeat labs on 11/02/2019 showed an improvement in her vitamin D level to 46.05

## 2019-11-09 NOTE — Progress Notes (Signed)
Jillian [name], [place] [id number]   CLINIC:  Medical Oncology/Hematology  PCP:  Fayrene Helper, MD [address] [place] [address] [phone]   REASON FOR VISIT: Follow-up for breast cancer and iron deficiency anemia  CURRENT THERAPY: Tamoxifen   INTERVAL HISTORY:  Jillian [name] 79 y.o. female returns for routine follow-up for breast cancer and iron deficiency anemia.  Patient reports she has been doing well since her last visit.  She denies any bright red bleeding per rectum or melena.  She denies any easy bruising or bleeding.  She denies any new lumps or adenopathy felt. Denies any nausea, vomiting, or diarrhea. Denies any new pains. Had not noticed any recent bleeding such as epistaxis, hematuria or hematochezia. Denies recent chest pain on exertion, shortness of breath on minimal exertion, pre-syncopal episodes, or palpitations. Denies any numbness or tingling in hands or feet. Denies any recent fevers, infections, or recent hospitalizations. Patient reports appetite at 75% and energy level at 50%.  She is eating well maintain her weight at this time.    REVIEW OF SYSTEMS:  Review of Systems  Constitutional: Positive for fatigue.  Neurological: Positive for dizziness.  Psychiatric/Behavioral: Positive for sleep disturbance.  All other systems reviewed and are negative.    PAST MEDICAL/SURGICAL HISTORY:  Past Medical History:  Diagnosis Date  . Atypical ductal hyperplasia of breast   . Chronic fatigue   . Diabetes mellitus 2011  . DJD (degenerative joint disease)    of neck    . Dyslipidemia   . 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  . Hypertension 2005  . Mild obesity   . Postmenopausal   . Viral meningitis   . Wears glasses    Past Surgical History:  Procedure Laterality Date  . ABDOMINAL HYSTERECTOMY  1992   fibroids  . APPENDECTOMY    . BREAST  EXCISIONAL BIOPSY Right    benign  . BREAST LUMPECTOMY WITH RADIOACTIVE SEED LOCALIZATION Right 10/05/2014   Procedure: BREAST LUMPECTOMY WITH RADIOACTIVE SEED LOCALIZATION;  Surgeon: Erroll Luna, MD;  Location: [place] [facility];  Service: General;  Laterality: Right;  . cataract Bilateral   . COLONOSCOPY  2013   Dr. Oneida Alar :  internal hemorrhoids, mild left-sided diverticulosis  . COLONOSCOPY N/A 09/21/2019   Procedure: COLONOSCOPY;  Surgeon: Danie Binder, MD;  Location: [facility];  Service: Endoscopy;  Laterality: N/A;  12:00pm  . CYST EXCISION Right    arm  . ESOPHAGOGASTRODUODENOSCOPY  2013   Dr. Oneida Alar: gastritis likely NSAID-induced. Duodenal polyp. Duodenitis and gastritis in setting of aspirin and Ibuprofen.   Marland Kitchen EYE SURGERY Right 06/07/2013   cataract  . EYE SURGERY Left 09/14/2013   cataract  . KNEE ARTHROSCOPY Right 02/26/2016   Procedure: ARTHROSCOPY RIGHT KNEE WITH MENSICAL DEBRIDEMENT;  Surgeon: Gaynelle Arabian, MD;  Location: [facility];  Service: Orthopedics;  Laterality: Right;  . left breast biopsy    . LESION REMOVAL Right 03/30/2013   Procedure: EXCISION OF NEOPLASM ARM;  Surgeon: Jamesetta So, MD;  Location: [facility];  Service: General;  Laterality: Right;  . TUBAL LIGATION       SOCIAL HISTORY:  Social History   Socioeconomic History  . Marital status: Widowed    Spouse name: Not on file  . Number of children: Not on file  . Years of education: Not on file  . Highest  education level: Not on file  Occupational History  . Not on file  Tobacco Use  . Smoking status: Never Smoker  . Smokeless tobacco: Never Used  Substance and Sexual Activity  . Alcohol use: No  . Drug use: No  . Sexual activity: Not Currently    Birth control/protection: Surgical  Other Topics Concern  . Not on file  Social History Narrative  . Not on file   Social Determinants of Health   Financial Resource Strain:   . Difficulty of Paying Living Expenses: Not on file    Food Insecurity:   . Worried About Charity fundraiser in the Last Year: Not on file  . Ran Out of Food in the Last Year: Not on file  Transportation Needs:   . Lack of Transportation (Medical): Not on file  . Lack of Transportation (Non-Medical): Not on file  Physical Activity:   . Days of Exercise per Week: Not on file  . Minutes of Exercise per Session: Not on file  Stress:   . Feeling of Stress : Not on file  Social Connections:   . Frequency of Communication with Friends and Family: Not on file  . Frequency of Social Gatherings with Friends and Family: Not on file  . Attends Religious Services: Not on file  . Active Member of Clubs or Organizations: Not on file  . Attends Archivist Meetings: Not on file  . Marital Status: Not on file  Intimate Partner Violence:   . Fear of Current or Ex-Partner: Not on file  . Emotionally Abused: Not on file  . Physically Abused: Not on file  . Sexually Abused: Not on file    FAMILY HISTORY:  Family History  Problem Relation Age of Onset  . Hypertension Father   . Congestive Heart Failure Father   . Liver cancer Mother   . Hypertension Sister   . Hypertension Brother   . Diabetes Brother   . Colon cancer Neg Hx   . Colon polyps Neg Hx     CURRENT MEDICATIONS:  Outpatient Encounter Medications as of 11/09/2019  Medication Sig  . amoxicillin-clavulanate (AUGMENTIN) 875-125 MG tablet Take 1 tablet by mouth 2 (two) times daily. Take for 10 days  . acetaminophen (TYLENOL) 500 MG tablet Take 1,000 mg by mouth every 6 (six) hours as needed for mild pain.  Marland Kitchen aspirin EC 81 MG tablet Take 1 tablet (81 mg total) by mouth every evening. (Patient taking differently: Take 81 mg by mouth at bedtime. )  . benazepril-hydrochlorthiazide (LOTENSIN HCT) 10-12.5 MG tablet Take 1 tablet by mouth daily.  . cloNIDine (CATAPRES) 0.2 MG tablet Take one tablet once daily at bedtime for blood pressure (Patient taking differently: Take 0.2 mg by  mouth at bedtime. )  . cyclobenzaprine (FLEXERIL) 10 MG tablet TAKE 1 TABLET BY MOUTH AT BEDTIME AS NEEDED FOR BACK SPASM (Patient taking differently: Take 10 mg by mouth at bedtime as needed for muscle spasms. )  . ergocalciferol (VITAMIN D2) 1.25 MG (50000 UT) capsule Take 1 capsule (50,000 Units total) by mouth once a week. (Patient taking differently: Take 50,000 Units by mouth once a week. Saturday)  . ferrous sulfate 325 (65 FE) MG EC tablet TAKE 1 TABLET EVERY DAY  . glucose blood test strip Use as instructed to check blood glucose once daily  . ibuprofen (ADVIL) 800 MG tablet Take 1 tablet (800 mg total) by mouth every 8 (eight) hours as needed. (Patient taking differently: Take 800  mg by mouth every 8 (eight) hours as needed for mild pain or moderate pain. )  . metFORMIN (GLUCOPHAGE-XR) 500 MG 24 hr tablet TAKE 1 TABLET TWICE DAILY (Patient taking differently: Take 500 mg by mouth at bedtime. )  . ondansetron (ZOFRAN) 4 MG tablet Take 1 tablet (4 mg total) by mouth every 6 (six) hours. (Patient not taking: Reported on 11/09/2019)  . pantoprazole (PROTONIX) 40 MG tablet Take 1 tablet (40 mg total) by mouth daily. Take 30 minutes before breakfast (Patient taking differently: Take 40 mg by mouth at bedtime. Take 30 minutes before breakfast)  . pravastatin (PRAVACHOL) 10 MG tablet TAKE 1 TABLET EVERY DAY  . tamoxifen (NOLVADEX) 20 MG tablet TAKE 1 TABLET (20 MG TOTAL) BY MOUTH DAILY. (Patient taking differently: Take 20 mg by mouth at bedtime. )  . vitamin B-12 (CYANOCOBALAMIN) 1000 MCG tablet Take 1,000 mcg by mouth every Monday, Wednesday, and Friday.   No facility-administered encounter medications on file as of 11/09/2019.    ALLERGIES:  Allergies  Allergen Reactions  . Codeine Nausea And Vomiting  . Propoxyphene N-Acetaminophen Other (See Comments)    hallucinations     PHYSICAL EXAM:  ECOG Performance status: 1  Vitals:   11/09/19 1124  BP: (!) 91/48  Pulse: 80  Resp: 20   Temp: (!) 97.1 F (36.2 C)  SpO2: 100%   There were no vitals filed for this visit.  Physical Exam Constitutional:      Appearance: Normal appearance. She is normal weight.  Cardiovascular:     Rate and Rhythm: Normal rate and regular rhythm.     Heart sounds: Normal heart sounds.  Pulmonary:     Effort: Pulmonary effort is normal.     Breath sounds: Normal breath sounds.  Abdominal:     General: Bowel sounds are normal.     Palpations: Abdomen is soft.  Musculoskeletal:        General: Normal range of motion.  Skin:    General: Skin is warm.  Neurological:     Mental Status: She is alert and oriented to person, place, and time. Mental status is at baseline.  Psychiatric:        Mood and Affect: Mood normal.        Behavior: Behavior normal.        Thought Content: Thought content normal.        Judgment: Judgment normal.      LABORATORY DATA:  I have reviewed the labs as listed.  CBC    Component Value Date/Time   WBC 6.9 11/02/2019 1007   RBC 3.80 (L) 11/02/2019 1007   HGB 11.5 (L) 11/02/2019 1007   HCT 37.3 11/02/2019 1007   PLT 190 11/02/2019 1007   MCV 98.2 11/02/2019 1007   MCH 30.3 11/02/2019 1007   MCHC 30.8 11/02/2019 1007   RDW 13.9 11/02/2019 1007   LYMPHSABS 2.2 11/02/2019 1007   MONOABS 0.3 11/02/2019 1007   EOSABS 0.0 11/02/2019 1007   BASOSABS 0.0 11/02/2019 1007   CMP Latest Ref Rng & Units 11/02/2019 08/02/2019 07/18/2019  Glucose 70 - 99 mg/dL 136(H) 118(H) 93  BUN 8 - 23 mg/dL 15 20 12   Creatinine 0.44 - 1.00 mg/dL 1.13(H) 1.62(H) 0.97(H)  Sodium 135 - 145 mmol/L 141 144 143  Potassium 3.5 - 5.1 mmol/L 3.4(L) 3.7 4.1  Chloride 98 - 111 mmol/L 105 110 110  CO2 22 - 32 mmol/L 29 26 26   Calcium 8.9 - 10.3 mg/dL 9.0 9.0 9.3  Total Protein 6.5 - 8.1 g/dL 7.2 6.5 6.6  Total Bilirubin 0.3 - 1.2 mg/dL 0.5 0.5 0.3  Alkaline Phos 38 - 126 U/L 51 50 -  AST 15 - 41 U/L 17 12(L) 16  ALT 0 - 44 U/L 16 9 10     I personally performed a  face-to-face visit.  All questions were answered to patient's stated satisfaction. Encouraged patient to call with any new concerns or questions before his next visit to the cancer center and we can certain see him sooner, if needed.     ASSESSMENT & PLAN:   Papilloma of right breast 1.  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 - Patient started tamoxifen early 2016.  She is tolerating it very well.   -She denies any vaginal bleeding or spotting.  Her mobility is limited from the sequelae of viral meningitis in August. - Her last mammogram was 01/31/2019 which was B RADS category 1 negative.  We reviewed the results today. - Physical examination today shows right breast lower inner quadrant scar with no palpable masses, no palpable adenopathy. -We will discontinue the tamoxifen at her next visit. -Labs done on 11/02/2019 showed hemoglobin 11.5, WBC 6.9, platelets 190, creatinine 1.13, potassium 3.4.  LDH 128 - She will follow-up in 6 months with repeat labs.  2.  Normocytic anemia: - Labs on 11/02/2019 showed her hemoglobin at 11.5, ferritin 91, percent saturation 15.   -She continues to take oral iron daily. - We will continue to monitor.  3.  Vitamin B-12 deficiency: -Labs on 01/31/2019 showed her B12 level at 170. -She will start on oral B12 tablets daily -Repeat labs on 08/02/2019 showed her vitamin B12 level 2330. -She will start taking her B12 every other day. -Repeat labs on 11/02/2019 showed her vitamin B12 level is 1014 -She will continue taking B12 every other day. -We will recheck her labs on her next visit.  4.  Night sweats: -Patient reports the tamoxifen has never bothered her as far as hot flashes or sweats.  Over the past month she is starting to have have drenching sweats at night.  She reports she is more fatigued. -We did a work-up which showed SPEP normal.  Flow cytometry normal.   Light chains normal.  BCR ABL pending -Patient reports her night sweats have improved.  5.  Increased creatinine: -Baseline creatinine is 0.86-1.05 -Labs on 08/02/2019 showed her creatinine 1.62. -Patient reports she has not been drinking as much fluids.  She will increase her fluid intake -Repeat labs on 11/02/2019 showed an improvement in her creatinine to 1.13. -We will recheck her next visit.  6.  Vitamin D deficiency: -Labs on 08/02/2019 showed vitamin D level 19.88 -I prescribed vit D 50,000 units weekly. -Repeat labs on 11/02/2019 showed an improvement in her vitamin D level to 46.05      Orders placed this encounter:  Orders Placed This Encounter  Procedures  . MM Digital Screening  . Lactate dehydrogenase  . CBC with Differential/Platelet  . Comprehensive metabolic panel  . Ferritin  . Iron and TIBC  . Vitamin B12  . VITAMIN D 25 Hydroxy (Vit-D Deficiency, Fractures)      Francene Finders, FNP-C [place] [phone]

## 2019-11-10 LAB — BCR-ABL1 FISH
Cells Analyzed: 200
Cells Counted: 200

## 2019-11-14 ENCOUNTER — Other Ambulatory Visit: Payer: Self-pay | Admitting: Family Medicine

## 2019-11-15 DIAGNOSIS — H524 Presbyopia: Secondary | ICD-10-CM | POA: Diagnosis not present

## 2019-11-15 LAB — HM DIABETES EYE EXAM

## 2019-11-28 ENCOUNTER — Telehealth: Payer: Self-pay

## 2019-11-28 NOTE — Telephone Encounter (Signed)
Pt is calling she is having issues with her back , what can be done--please call

## 2019-11-28 NOTE — Telephone Encounter (Signed)
Needs appt with Jarrett Soho if willing to see her

## 2019-11-29 NOTE — Telephone Encounter (Signed)
Called the Pt she states that she would like to be put on the cxl list if someone cxls, however she made an appointment for 3-17, but would like to be seen prior to

## 2019-12-07 ENCOUNTER — Ambulatory Visit (INDEPENDENT_AMBULATORY_CARE_PROVIDER_SITE_OTHER): Payer: Medicare HMO | Admitting: Family Medicine

## 2019-12-07 ENCOUNTER — Other Ambulatory Visit: Payer: Self-pay

## 2019-12-07 ENCOUNTER — Encounter: Payer: Self-pay | Admitting: Family Medicine

## 2019-12-07 VITALS — BP 132/80 | HR 90 | Temp 97.6°F | Resp 15 | Ht 64.0 in | Wt 188.0 lb

## 2019-12-07 DIAGNOSIS — M222X1 Patellofemoral disorders, right knee: Secondary | ICD-10-CM | POA: Diagnosis not present

## 2019-12-07 DIAGNOSIS — M222X2 Patellofemoral disorders, left knee: Secondary | ICD-10-CM

## 2019-12-07 NOTE — Patient Instructions (Signed)
I appreciate the opportunity to provide you with care for your health and wellness. Today we discussed: knee pain  Follow up: 02/07/2020 as scheduled  Referral to Ortho for knee pain  Take 650 mg of tylenol in the morning and you may repeat this dose 6-8 hours later if needed.  Please continue to practice social distancing to keep you, your family, and our community safe.  If you must go out, please wear a mask and practice good handwashing.  It was a pleasure to see you and I look forward to continuing to work together on your health and well-being. Please do not hesitate to call the office if you need care or have questions about your care.  Have a wonderful day and week. With Gratitude, Cherly Beach, DNP, AGNP-BC

## 2019-12-07 NOTE — Progress Notes (Signed)
Subjective:  Patient ID: Jillian [name], female    DOB: [birth year]  Age: 79 y.o. MRN: [id number]  CC: No chief complaint on file.     HPI  HPI Jillian [name] is a 79 year old female patient seen today.  He presents today with ongoing knee discomfort predominantly on the right side.  She reports that started a few weeks ago.  The right is much worse than the left.  She also notices some swelling more on the right.  She reports as long as she is up moving around is okay.  Sore and achy and hurts when she goes to bear weight or bend the actual knee and utilized the range of motion of it.  She reports it predominantly hurts on the top of the kneecap into the upper thigh.  When she goes to stand up put weight down or walk is when she feels and legs.  She reports the discomfort being a 9 out of 10 when she does feel it.  Reports she is taking Tylenol but is not help very much.  Denies having any injury.  Today patient denies signs and symptoms of COVID 19 infection including fever, chills, cough, shortness of breath, and headache. Past Medical, Surgical, Social History, Allergies, and Medications have been Reviewed.   Past Medical History:  Diagnosis Date  . Atypical ductal hyperplasia of breast   . Chronic fatigue   . Diabetes mellitus 2011  . DJD (degenerative joint disease)    of neck    . Dyslipidemia   . 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 Hypertension 2005  . Mild obesity   . Postmenopausal   . Viral meningitis   . Wears glasses     Current Meds  Medication Sig  . acetaminophen (TYLENOL) 500 MG tablet Take 1,000 mg by mouth every 6 (six) hours as needed for mild pain.  Marland Kitchen amoxicillin-clavulanate (AUGMENTIN) 875-125 MG tablet Take 1 tablet by mouth 2 (two) times daily. Take for 10 days  . aspirin EC 81 MG tablet Take 1 tablet (81 mg total) by mouth every evening. (Patient taking differently: Take 81 mg by mouth at  bedtime. )  . benazepril-hydrochlorthiazide (LOTENSIN HCT) 10-12.5 MG tablet Take 1 tablet by mouth daily.  . cloNIDine (CATAPRES) 0.2 MG tablet Take one tablet once daily at bedtime for blood pressure (Patient taking differently: Take 0.2 mg by mouth at bedtime. )  . cyclobenzaprine (FLEXERIL) 10 MG tablet TAKE 1 TABLET BY MOUTH AT BEDTIME AS NEEDED FOR BACK SPASM (Patient taking differently: Take 10 mg by mouth at bedtime as needed for muscle spasms. )  . ergocalciferol (VITAMIN D2) 1.25 MG (50000 UT) capsule Take 1 capsule (50,000 Units total) by mouth once a week. (Patient taking differently: Take 50,000 Units by mouth once a week. Saturday)  . ferrous sulfate 325 (65 FE) MG EC tablet TAKE 1 TABLET EVERY DAY  . glucose blood test strip Use as instructed to check blood glucose once daily  . metFORMIN (GLUCOPHAGE-XR) 500 MG 24 hr tablet TAKE 1 TABLET TWICE DAILY (Patient taking differently: Take 500 mg by mouth at bedtime. )  . ondansetron (ZOFRAN) 4 MG tablet Take 1 tablet (4 mg total) by mouth every 6 (six) hours.  . pantoprazole (PROTONIX) 40 MG tablet Take 1 tablet (40 mg total) by mouth daily. Take 30 minutes before breakfast (Patient taking differently: Take 40 mg by mouth at bedtime.  Take 30 minutes before breakfast)  . pravastatin (PRAVACHOL) 10 MG tablet TAKE 1 TABLET EVERY DAY  . tamoxifen (NOLVADEX) 20 MG tablet TAKE 1 TABLET (20 MG TOTAL) BY MOUTH DAILY. (Patient taking differently: Take 20 mg by mouth at bedtime. )  . vitamin B-12 (CYANOCOBALAMIN) 1000 MCG tablet Take 1,000 mcg by mouth every Monday, Wednesday, and Friday.  . [DISCONTINUED] ibuprofen (ADVIL) 800 MG tablet Take 1 tablet (800 mg total) by mouth every 8 (eight) hours as needed. (Patient taking differently: Take 800 mg by mouth every 8 (eight) hours as needed for mild pain or moderate pain. )    ROS:  Review of Systems  Constitutional: Negative.   HENT: Negative.   Eyes: Negative.   Respiratory: Negative.    Cardiovascular: Negative.   Gastrointestinal: Negative.   Genitourinary: Negative.   Musculoskeletal: Positive for joint pain.  Skin: Negative.   Neurological: Negative.   Endo/Heme/Allergies: Negative.   Psychiatric/Behavioral: Negative.   All other systems reviewed and are negative.   Objective:   Today's Vitals: BP 132/80   Pulse 90   Temp 97.6 F (36.4 C) (Temporal)   Resp 15   Ht 5\' 4"  (1.626 m)   Wt 188 lb (85.3 kg)   SpO2 98%   BMI 32.27 kg/m  Vitals with BMI 12/07/2019 11/09/2019 10/28/2019  Height 5\' 4"  - -  Weight 188 lbs - -  BMI [id number] - -  Systolic Q000111Q 91 -  Diastolic 80 48 -  Pulse 90 80 105     Physical Exam Vitals and nursing note reviewed.  Constitutional:      Appearance: Normal appearance. She is obese.  HENT:     Head: Normocephalic and atraumatic.     Right Ear: External ear normal.     Left Ear: External ear normal.     Mouth/Throat:     Comments: Mask in place Eyes:     General:        Right eye: No discharge.        Left eye: No discharge.     Conjunctiva/sclera: Conjunctivae normal.  Cardiovascular:     Rate and Rhythm: Normal rate and regular rhythm.     Pulses: Normal pulses.     Heart sounds: Normal heart sounds.  Pulmonary:     Effort: Pulmonary effort is normal.     Breath sounds: Normal breath sounds.  Musculoskeletal:     Cervical back: Normal range of motion and neck supple.     Right knee: Swelling and crepitus present. No erythema, ecchymosis or bony tenderness. Normal range of motion. No tenderness.     Left knee: Crepitus present. No swelling, effusion, erythema, ecchymosis or bony tenderness. Normal range of motion. No tenderness.  Skin:    General: Skin is warm.  Neurological:     General: No focal deficit present.     Mental Status: She is alert and oriented to person, place, and time.  Psychiatric:        Mood and Affect: Mood normal.        Behavior: Behavior normal.        Thought Content: Thought content  normal.        Judgment: Judgment normal.    Assessment   1. Patellofemoral arthralgia of both knees     Tests ordered Orders Placed This Encounter  Procedures  . Ambulatory referral to Orthopedic Surgery     Plan: Please see assessment and plan per problem list above.   No orders  of the defined types were placed in this encounter.  15 minutes of face-to-face time Patient to follow-up in as scheduled.  Perlie Mayo, NP

## 2019-12-07 NOTE — Assessment & Plan Note (Signed)
Questional bilateral patellofemoral syndrome predominantly more on the right than the left.  Referral to orthopedist to see if there is anything that can be done.  Unsure if it is related to dysfunction versus arthritis or any other joint changes.  Appreciate collaboration of care.  She is advised to use Tylenol twice a day as needed.  And to avoid ibuprofen secondary to reduction in kidney function.

## 2019-12-15 ENCOUNTER — Ambulatory Visit: Payer: Medicare HMO | Admitting: Orthopaedic Surgery

## 2019-12-22 ENCOUNTER — Other Ambulatory Visit: Payer: Self-pay

## 2019-12-22 ENCOUNTER — Ambulatory Visit: Payer: Medicare HMO | Admitting: Orthopaedic Surgery

## 2019-12-22 ENCOUNTER — Encounter: Payer: Self-pay | Admitting: Orthopaedic Surgery

## 2019-12-22 ENCOUNTER — Ambulatory Visit: Payer: Medicare HMO

## 2019-12-22 VITALS — BP 126/74 | HR 84 | Ht 64.0 in | Wt 182.0 lb

## 2019-12-22 DIAGNOSIS — G8929 Other chronic pain: Secondary | ICD-10-CM

## 2019-12-22 DIAGNOSIS — M25511 Pain in right shoulder: Secondary | ICD-10-CM

## 2019-12-22 MED ORDER — HYDROCODONE-ACETAMINOPHEN 5-325 MG PO TABS: ORAL_TABLET | ORAL | 0 refills | Status: DC

## 2019-12-22 NOTE — Progress Notes (Signed)
Patient of Dr. Ruthe Mannan with increased pain over the right [facility] joint.  X-rays were done showing DJD of the [facility] joint.  She has no trauma.  PROCEDURE NOTE:  The patient request injection, verbal consent was obtained.  The right shoulder was prepped appropriately after time out was performed.   Sterile technique was observed and injection of 1 cc of Depo-Medrol 40 mg with several cc's of plain xylocaine. Anesthesia was provided by ethyl chloride and a 20-gauge needle was used to inject the shoulder area. A posterior approach was used.  The injection was tolerated well.  A band aid dressing was applied.  The patient was advised to apply ice later today and tomorrow to the injection sight as needed.  See Dr. Aline Brochure as needed.  Encounter Diagnosis  Name Primary?  . Chronic right shoulder pain Yes   Call if any problem.  Precautions discussed.   Electronically Signed Sanjuana Kava, MD 4/1/202110:03 AM

## 2019-12-29 ENCOUNTER — Telehealth: Payer: Self-pay | Admitting: Family Medicine

## 2019-12-29 DIAGNOSIS — E1159 Type 2 diabetes mellitus with other circulatory complications: Secondary | ICD-10-CM

## 2019-12-29 DIAGNOSIS — E785 Hyperlipidemia, unspecified: Secondary | ICD-10-CM

## 2019-12-29 DIAGNOSIS — E559 Vitamin D deficiency, unspecified: Secondary | ICD-10-CM

## 2019-12-29 NOTE — Telephone Encounter (Signed)
pls  Order and let pt know needs these labs which are past due, fasting lipid, cmp and EGFr, hBA1C , TSH, Vit D and microalb in next 1 week, thanks

## 2020-01-02 NOTE — Addendum Note (Signed)
Addended by: Eual Fines on: 01/02/2020 07:51 PM   Modules accepted: Orders

## 2020-01-05 ENCOUNTER — Other Ambulatory Visit: Payer: Self-pay

## 2020-01-05 ENCOUNTER — Ambulatory Visit (INDEPENDENT_AMBULATORY_CARE_PROVIDER_SITE_OTHER): Payer: Medicare HMO | Admitting: Family Medicine

## 2020-01-05 ENCOUNTER — Encounter: Payer: Self-pay | Admitting: Family Medicine

## 2020-01-05 VITALS — BP 128/68 | HR 82 | Temp 97.8°F | Ht 64.0 in | Wt 184.2 lb

## 2020-01-05 DIAGNOSIS — I10 Essential (primary) hypertension: Secondary | ICD-10-CM

## 2020-01-05 MED ORDER — BENAZEPRIL HCL 5 MG PO TABS: 5.0000 mg | ORAL_TABLET | Freq: Every day | ORAL | 1 refills | Status: DC

## 2020-01-05 NOTE — Progress Notes (Signed)
Established Patient Office Visit  Subjective:  Patient ID: Jillian [name], female    DOB: [birth year]  Age: 79 y.o. MRN: [id number]  CC:  Chief Complaint  Patient presents with  . Allergic Reaction    medication Benazepril has been making me dizzy, light headed and Bp drop. Been on this med since Oct.    HPI Jillian [name] presents for HTN-benazepril/HCTZ daily, Clonidine BID Home monitor blood pressure 120's/60's-pt states dizziness, headaches, fatigue-concern for over medication causing symptoms  Past Medical History:  Diagnosis Date  . Atypical ductal hyperplasia of breast   . Chronic fatigue   . Diabetes mellitus 2011  . DJD (degenerative joint disease)    of neck    . Dyslipidemia   . 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 Hypertension 2005  . Mild obesity   . Postmenopausal   . Viral meningitis   . Wears glasses     Past Surgical History:  Procedure Laterality Date  . ABDOMINAL HYSTERECTOMY  1992   fibroids  . APPENDECTOMY    . BREAST EXCISIONAL BIOPSY Right    benign  . BREAST LUMPECTOMY WITH RADIOACTIVE SEED LOCALIZATION Right 10/05/2014   Procedure: BREAST LUMPECTOMY WITH RADIOACTIVE SEED LOCALIZATION;  Surgeon: Erroll Luna, MD;  Location: [facility];  Service: General;  Laterality: Right;  . cataract Bilateral   . COLONOSCOPY  2013   Dr. Oneida Alar :  internal hemorrhoids, mild left-sided diverticulosis  . COLONOSCOPY N/A 09/21/2019   Procedure: COLONOSCOPY;  Surgeon: Danie Binder, MD;  Location: [facility];  Service: Endoscopy;  Laterality: N/A;  12:00pm  . CYST EXCISION Right    arm  . ESOPHAGOGASTRODUODENOSCOPY  2013   Dr. Oneida Alar: gastritis likely NSAID-induced. Duodenal polyp. Duodenitis and gastritis in setting of aspirin and Ibuprofen.   Marland Kitchen EYE SURGERY Right 06/07/2013   cataract  . EYE SURGERY Left 09/14/2013   cataract  . KNEE ARTHROSCOPY Right 02/26/2016   Procedure: ARTHROSCOPY RIGHT KNEE WITH MENSICAL DEBRIDEMENT;  Surgeon: Gaynelle Arabian, MD;  Location: [facility];  Service: Orthopedics;  Laterality: Right;  . left breast biopsy    . LESION REMOVAL Right 03/30/2013   Procedure: EXCISION OF NEOPLASM ARM;  Surgeon: Jamesetta So, MD;  Location: [facility];  Service: General;  Laterality: Right;  . TUBAL LIGATION      Family History  Problem Relation Age of Onset  . Hypertension Father   . Congestive Heart Failure Father   . Liver cancer Mother   . Hypertension Sister   . Hypertension Brother   . Diabetes Brother   . Colon cancer Neg Hx   . Colon polyps Neg Hx     Social History   Socioeconomic History  . Marital status: Widowed    Spouse name: Not on file  . Number of children: Not on file  . Years of education: Not on file  . Highest education level: Not on file  Occupational History  . Not on file  Tobacco Use  . Smoking status: Never Smoker  . Smokeless tobacco: Never Used  Substance and Sexual Activity  . Alcohol use: No  . Drug use: No  . Sexual activity: Not Currently    Birth control/protection: Surgical  Other Topics Concern  . Not on file  Social History Narrative  . Not on file   Social Determinants of Health   Financial Resource Strain:   . Difficulty of  Paying Living Expenses:   Food Insecurity:   . Worried About Charity fundraiser in the Last Year:   . Arboriculturist in the Last Year:   Transportation Needs:   . Film/video editor (Medical):   Marland Kitchen Lack of Transportation (Non-Medical):   Physical Activity:   . Days of Exercise per Week:   . Minutes of Exercise per Session:   Stress:   . Feeling of Stress :   Social Connections:   . Frequency of Communication with Friends and Family:   . Frequency of Social Gatherings with Friends and Family:   . Attends Religious Services:   . Active Member of Clubs or Organizations:   . Attends Archivist Meetings:   Marland Kitchen Marital Status:   Intimate Partner  Violence:   . Fear of Current or Ex-Partner:   . Emotionally Abused:   Marland Kitchen Physically Abused:   . Sexually Abused:     Outpatient Medications Prior to Visit  Medication Sig Dispense Refill  . acetaminophen (TYLENOL) 500 MG tablet Take 1,000 mg by mouth every 6 (six) hours as needed for mild pain.    Marland Kitchen amoxicillin-clavulanate (AUGMENTIN) 875-125 MG tablet Take 1 tablet by mouth 2 (two) times daily. Take for 10 days    . aspirin EC 81 MG tablet Take 1 tablet (81 mg total) by mouth every evening. (Patient taking differently: Take 81 mg by mouth at bedtime. ) 90 tablet 1  . benazepril-hydrochlorthiazide (LOTENSIN HCT) 10-12.5 MG tablet Take 1 tablet by mouth daily. 90 tablet 1  . cloNIDine (CATAPRES) 0.2 MG tablet Take one tablet once daily at bedtime for blood pressure (Patient taking differently: Take 0.2 mg by mouth at bedtime. ) 90 tablet 3  . cyclobenzaprine (FLEXERIL) 10 MG tablet TAKE 1 TABLET BY MOUTH AT BEDTIME AS NEEDED FOR BACK SPASM (Patient taking differently: Take 10 mg by mouth at bedtime as needed for muscle spasms. ) 20 tablet 0  . ergocalciferol (VITAMIN D2) 1.25 MG (50000 UT) capsule Take 1 capsule (50,000 Units total) by mouth once a week. (Patient taking differently: Take 50,000 Units by mouth once a week. Saturday) 16 capsule 4  . ferrous sulfate 325 (65 FE) MG EC tablet TAKE 1 TABLET EVERY DAY 90 tablet 2  . glucose blood test strip Use as instructed to check blood glucose once daily 100 each 0  . HYDROcodone-acetaminophen (NORCO/VICODIN) 5-325 MG tablet One tablet every four hours as needed for acute pain.  Limit of five days per [place]. 30 tablet 0  . metFORMIN (GLUCOPHAGE-XR) 500 MG 24 hr tablet TAKE 1 TABLET TWICE DAILY (Patient taking differently: Take 500 mg by mouth at bedtime. ) 180 tablet 0  . ondansetron (ZOFRAN) 4 MG tablet Take 1 tablet (4 mg total) by mouth every 6 (six) hours. 12 tablet 0  . pantoprazole (PROTONIX) 40 MG tablet Take 1 tablet (40 mg total)  by mouth daily. Take 30 minutes before breakfast (Patient taking differently: Take 40 mg by mouth at bedtime. Take 30 minutes before breakfast) 90 tablet 3  . pravastatin (PRAVACHOL) 10 MG tablet TAKE 1 TABLET EVERY DAY 90 tablet 0  . tamoxifen (NOLVADEX) 20 MG tablet TAKE 1 TABLET (20 MG TOTAL) BY MOUTH DAILY. (Patient taking differently: Take 20 mg by mouth at bedtime. ) 90 tablet 4  . vitamin B-12 (CYANOCOBALAMIN) 1000 MCG tablet Take 1,000 mcg by mouth every Monday, Wednesday, and Friday.     No facility-administered medications prior to  visit.    Allergies  Allergen Reactions  . Codeine Nausea And Vomiting  . Propoxyphene N-Acetaminophen Other (See Comments)    hallucinations    ROS Review of Systems  Constitutional: Positive for fatigue.  Respiratory: Negative.   Cardiovascular: Negative.   Neurological: Positive for dizziness and headaches.      Objective:    Physical Exam  Constitutional: She is oriented to person, place, and time. She appears well-developed and well-nourished.  HENT:  Head: Normocephalic and atraumatic.  Eyes: Conjunctivae are normal.  Cardiovascular: Normal rate, regular rhythm, normal heart sounds and intact distal pulses.  Pulmonary/Chest: Effort normal and breath sounds normal.  Neurological: She is alert and oriented to person, place, and time.  Psychiatric: She has a normal mood and affect. Her behavior is normal.    BP 128/68 (BP Location: Left Arm, Patient Position: Sitting, Cuff Size: Large)   Pulse 82   Temp 97.8 F (36.6 C) (Temporal)   Ht 5\' 4"  (1.626 m)   Wt 184 lb 3.2 oz (83.6 kg)   SpO2 98%   BMI 31.62 kg/m  Wt Readings from Last 3 Encounters:  01/05/20 184 lb 3.2 oz (83.6 kg)  12/22/19 182 lb (82.6 kg)  12/07/19 188 lb (85.3 kg)     Health Maintenance Due  Topic Date Due  . TETANUS/TDAP  05/01/2014  . HEMOGLOBIN A1C  05/09/2019    Lab Results  Component Value Date   TSH 1.08 11/08/2018   Lab Results  Component  Value Date   WBC 6.9 11/02/2019   HGB 11.5 (L) 11/02/2019   HCT 37.3 11/02/2019   MCV 98.2 11/02/2019   PLT 190 11/02/2019   Lab Results  Component Value Date   NA 141 11/02/2019   K 3.4 (L) 11/02/2019   CO2 29 11/02/2019   GLUCOSE 136 (H) 11/02/2019   BUN 15 11/02/2019   CREATININE 1.13 (H) 11/02/2019   BILITOT 0.5 11/02/2019   ALKPHOS 51 11/02/2019   AST 17 11/02/2019   ALT 16 11/02/2019   PROT 7.2 11/02/2019   ALBUMIN 3.7 11/02/2019   CALCIUM 9.0 11/02/2019   ANIONGAP 7 11/02/2019   Lab Results  Component Value Date   CHOL 118 07/18/2019   Lab Results  Component Value Date   HDL 46 (L) 07/18/2019   Lab Results  Component Value Date   LDLCALC 50 07/18/2019   Lab Results  Component Value Date   TRIG 137 07/18/2019   Lab Results  Component Value Date   CHOLHDL 2.6 07/18/2019   Lab Results  Component Value Date   HGBA1C 6.7 (H) 11/08/2018      Assessment & Plan:  1. Essential hypertension, benign Stop -benazepril/HCTZ, start benazepril 5mg -daily, recheck blood work in 2 weeks prior to seeing Jarrett Soho for follow up. Continue taking Clonidine/ Follow-up: Keep appointment  with Fabio Neighbors, MD

## 2020-01-05 NOTE — Patient Instructions (Addendum)
  Take Benazepril 5mg  daily-Stop Benazepril/HCTZ Drink plenty of water-36oz Blood work prior to seeing Jarrett Soho for follow up  IF you received an x-ray today, you will receive an invoice from [facility]. Please contact [facility] at [phone] with questions or concerns regarding your invoice.   IF you received labwork today, you will receive an invoice from [facility]. Please contact [facility] at [phone] with questions or concerns regarding your invoice.   Our billing staff will not be able to assist you with questions regarding bills from these companies.  You will be contacted with the lab results as soon as they are available. The fastest way to get your results is to activate your My Chart account. Instructions are located on the last page of this paperwork. If you have not heard from [place] regarding the results in 2 weeks, please contact this office.

## 2020-01-06 ENCOUNTER — Telehealth: Payer: Self-pay | Admitting: Family Medicine

## 2020-01-06 NOTE — Telephone Encounter (Signed)
Pt came in office today and states she was supposed to get a new medication for BP. I see Benazepril 5mg  qd in the last office note. Pt states this needs to go to [facility] on [address]

## 2020-01-09 ENCOUNTER — Other Ambulatory Visit: Payer: Self-pay | Admitting: Family Medicine

## 2020-01-09 MED ORDER — BENAZEPRIL HCL 5 MG PO TABS: 5.0000 mg | ORAL_TABLET | Freq: Every day | ORAL | 1 refills | Status: DC

## 2020-01-09 NOTE — Telephone Encounter (Signed)
Rx sent date of appt and resent today

## 2020-01-25 ENCOUNTER — Telehealth: Payer: Self-pay | Admitting: Orthopaedic Surgery

## 2020-01-25 NOTE — Telephone Encounter (Signed)
Patient called to request "another shot in the same shoulder", which was last done on 12/22/19. I relayed we can schedule a follow up appointment, and that Dr can evaluate her shoulder and provide another injection if recommended. Patient is still wanting to know if an injection can be done again at this time.

## 2020-01-26 NOTE — Telephone Encounter (Signed)
Set her up for injection

## 2020-01-30 DIAGNOSIS — E785 Hyperlipidemia, unspecified: Secondary | ICD-10-CM | POA: Diagnosis not present

## 2020-01-30 DIAGNOSIS — E1159 Type 2 diabetes mellitus with other circulatory complications: Secondary | ICD-10-CM | POA: Diagnosis not present

## 2020-01-30 DIAGNOSIS — E559 Vitamin D deficiency, unspecified: Secondary | ICD-10-CM | POA: Diagnosis not present

## 2020-01-31 LAB — COMPLETE METABOLIC PANEL WITH GFR
AG Ratio: 1.4 (calc) (ref 1.0–2.5)
ALT: 10 U/L (ref 6–29)
AST: 15 U/L (ref 10–35)
Albumin: 3.9 g/dL (ref 3.6–5.1)
Alkaline phosphatase (APISO): 47 U/L (ref 37–153)
BUN/Creatinine Ratio: 13 (calc) (ref 6–22)
BUN: 13 mg/dL (ref 7–25)
CO2: 26 mmol/L (ref 20–32)
Calcium: 9.3 mg/dL (ref 8.6–10.4)
Chloride: 109 mmol/L (ref 98–110)
Creat: 0.99 mg/dL — ABNORMAL HIGH (ref 0.60–0.93)
GFR, Est African American: 64 mL/min/{1.73_m2} (ref >=60)
GFR, Est Non African American: 55 mL/min/{1.73_m2} — ABNORMAL LOW (ref >=60)
Globulin: 2.8 g/dL (calc) (ref 1.9–3.7)
Glucose, Bld: 106 mg/dL — ABNORMAL HIGH (ref 65–99)
Potassium: 4.9 mmol/L (ref 3.5–5.3)
Sodium: 145 mmol/L (ref 135–146)
Total Bilirubin: 0.3 mg/dL (ref 0.2–1.2)
Total Protein: 6.7 g/dL (ref 6.1–8.1)

## 2020-01-31 LAB — MICROALBUMIN / CREATININE URINE RATIO
Creatinine, Urine: 340 mg/dL — ABNORMAL HIGH (ref 20–275)
Microalb Creat Ratio: 4 mcg/mg creat (ref <30)
Microalb, Ur: 1.3 mg/dL

## 2020-01-31 LAB — HEMOGLOBIN A1C
Hgb A1c MFr Bld: 6.5 % of total Hgb — ABNORMAL HIGH (ref <5.7)
Mean Plasma Glucose: 140 (calc)
eAG (mmol/L): 7.7 (calc)

## 2020-01-31 LAB — LIPID PANEL
Cholesterol: 114 mg/dL (ref <200)
HDL: 48 mg/dL — ABNORMAL LOW (ref >=50)
LDL Cholesterol (Calc): 48 mg/dL (calc)
Non-HDL Cholesterol (Calc): 66 mg/dL (calc) (ref <130)
Total CHOL/HDL Ratio: 2.4 (calc) (ref <5.0)
Triglycerides: 99 mg/dL (ref <150)

## 2020-01-31 LAB — TSH: TSH: 1.29 mIU/L (ref 0.40–4.50)

## 2020-01-31 LAB — VITAMIN D 25 HYDROXY (VIT D DEFICIENCY, FRACTURES): Vit D, 25-Hydroxy: 72 ng/mL (ref 30–100)

## 2020-02-01 NOTE — Telephone Encounter (Signed)
I spoke to Jillian [name] and she said her shoulder was feeling much better.  She will call back if she feels like she needs injection again

## 2020-02-03 ENCOUNTER — Ambulatory Visit (HOSPITAL_COMMUNITY)
Admission: RE | Admit: 2020-02-03 | Discharge: 2020-02-03 | Disposition: A | Discharge status: Home / Self Care | Payer: Medicare HMO | Source: Ambulatory Visit | Attending: Nurse Practitioner | Admitting: Nurse Practitioner

## 2020-02-03 ENCOUNTER — Other Ambulatory Visit: Payer: Self-pay

## 2020-02-03 DIAGNOSIS — Z1231 Encounter for screening mammogram for malignant neoplasm of breast: Secondary | ICD-10-CM | POA: Insufficient documentation

## 2020-02-06 ENCOUNTER — Ambulatory Visit (INDEPENDENT_AMBULATORY_CARE_PROVIDER_SITE_OTHER): Payer: Medicare HMO

## 2020-02-06 ENCOUNTER — Other Ambulatory Visit: Payer: Self-pay

## 2020-02-06 VITALS — BP 128/68 | Ht 64.0 in | Wt 184.0 lb

## 2020-02-06 DIAGNOSIS — Z0000 Encounter for general adult medical examination without abnormal findings: Secondary | ICD-10-CM

## 2020-02-06 NOTE — Patient Instructions (Signed)
Jillian [name] , Thank you for taking time to come for your Medicare Wellness Visit. I appreciate your ongoing commitment to your health goals. Please review the following plan we discussed and let me know if I can assist you in the future.   Screening recommendations/referrals: Colonoscopy: up to date  Mammogram: up to date  Bone Density: up to date  Recommended yearly ophthalmology/optometry visit for glaucoma screening and checkup Recommended yearly dental visit for hygiene and checkup  Vaccinations: Influenza vaccine: up to date  Pneumococcal vaccine: up to date  Tdap vaccine: due  Shingles vaccine: can check pharmacy      Preventive Care 31 Years and Older, Female Preventive care refers to lifestyle choices and visits with your health care provider that can promote health and wellness. What does preventive care include?  A yearly physical exam. This is also called an annual well check.  Dental exams once or twice a year.  Routine eye exams. Ask your health care provider how often you should have your eyes checked.  Personal lifestyle choices, including:  Daily care of your teeth and gums.  Regular physical activity.  Eating a healthy diet.  Avoiding tobacco and drug use.  Limiting alcohol use.  Practicing safe sex.  Taking low-dose aspirin every day.  Taking vitamin and mineral supplements as recommended by your health care provider. What happens during an annual well check? The services and screenings done by your health care provider during your annual well check will depend on your age, overall health, lifestyle risk factors, and family history of disease. Counseling  Your health care provider may ask you questions about your:  Alcohol use.  Tobacco use.  Drug use.  Emotional well-being.  Home and relationship well-being.  Sexual activity.  Eating habits.  History of falls.  Memory and ability to understand (cognition).  Work and work  Statistician.  Reproductive health. Screening  You may have the following tests or measurements:  Height, weight, and BMI.  Blood pressure.  Lipid and cholesterol levels. These may be checked every 5 years, or more frequently if you are over 60 years old.  Skin check.  Lung cancer screening. You may have this screening every year starting at age 57 if you have a 30-pack-year history of smoking and currently smoke or have quit within the past 15 years.  Fecal occult blood test (FOBT) of the stool. You may have this test every year starting at age 70.  Flexible sigmoidoscopy or colonoscopy. You may have a sigmoidoscopy every 5 years or a colonoscopy every 10 years starting at age 60.  Hepatitis C blood test.  Hepatitis B blood test.  Sexually transmitted disease (STD) testing.  Diabetes screening. This is done by checking your blood sugar (glucose) after you have not eaten for a while (fasting). You may have this done every 1-3 years.  Bone density scan. This is done to screen for osteoporosis. You may have this done starting at age 63.  Mammogram. This may be done every 1-2 years. Talk to your health care provider about how often you should have regular mammograms. Talk with your health care provider about your test results, treatment options, and if necessary, the need for more tests. Vaccines  Your health care provider may recommend certain vaccines, such as:  Influenza vaccine. This is recommended every year.  Tetanus, diphtheria, and acellular pertussis (Tdap, Td) vaccine. You may need a Td booster every 10 years.  Zoster vaccine. You may need this after age 31.  Pneumococcal 13-valent conjugate (PCV13) vaccine. One dose is recommended after age 70.  Pneumococcal polysaccharide (PPSV23) vaccine. One dose is recommended after age 55. Talk to your health care provider about which screenings and vaccines you need and how often you need them. This information is not  intended to replace advice given to you by your health care provider. Make sure you discuss any questions you have with your health care provider. Document Released: 10/05/2015 Document Revised: 05/28/2016 Document Reviewed: 07/10/2015 Elsevier Interactive Patient Education  2017 [place] Prevention in the Home Falls can cause injuries. They can happen to people of all ages. There are many things you can do to make your home safe and to help prevent falls. What can I do on the outside of my home?  Regularly fix the edges of walkways and driveways and fix any cracks.  Remove anything that might make you trip as you walk through a door, such as a raised step or threshold.  Trim any bushes or trees on the path to your home.  Use bright outdoor lighting.  Clear any walking paths of anything that might make someone trip, such as rocks or tools.  Regularly check to see if handrails are loose or broken. Make sure that both sides of any steps have handrails.  Any raised decks and porches should have guardrails on the edges.  Have any leaves, snow, or ice cleared regularly.  Use sand or salt on walking paths during winter.  Clean up any spills in your garage right away. This includes oil or grease spills. What can I do in the bathroom?  Use night lights.  Install grab bars by the toilet and in the tub and shower. Do not use towel bars as grab bars.  Use non-skid mats or decals in the tub or shower.  If you need to sit down in the shower, use a plastic, non-slip stool.  Keep the floor dry. Clean up any water that spills on the floor as soon as it happens.  Remove soap buildup in the tub or shower regularly.  Attach bath mats securely with double-sided non-slip rug tape.  Do not have throw rugs and other things on the floor that can make you trip. What can I do in the bedroom?  Use night lights.  Make sure that you have a light by your bed that is easy to reach.  Do  not use any sheets or blankets that are too big for your bed. They should not hang down onto the floor.  Have a firm chair that has side arms. You can use this for support while you get dressed.  Do not have throw rugs and other things on the floor that can make you trip. What can I do in the kitchen?  Clean up any spills right away.  Avoid walking on wet floors.  Keep items that you use a lot in easy-to-reach places.  If you need to reach something above you, use a strong step stool that has a grab bar.  Keep electrical cords out of the way.  Do not use floor polish or wax that makes floors slippery. If you must use wax, use non-skid floor wax.  Do not have throw rugs and other things on the floor that can make you trip. What can I do with my stairs?  Do not leave any items on the stairs.  Make sure that there are handrails on both sides of the stairs and use them.  Fix handrails that are broken or loose. Make sure that handrails are as long as the stairways.  Check any carpeting to make sure that it is firmly attached to the stairs. Fix any carpet that is loose or worn.  Avoid having throw rugs at the top or bottom of the stairs. If you do have throw rugs, attach them to the floor with carpet tape.  Make sure that you have a light switch at the top of the stairs and the bottom of the stairs. If you do not have them, ask someone to add them for you. What else can I do to help prevent falls?  Wear shoes that:  Do not have high heels.  Have rubber bottoms.  Are comfortable and fit you well.  Are closed at the toe. Do not wear sandals.  If you use a stepladder:  Make sure that it is fully opened. Do not climb a closed stepladder.  Make sure that both sides of the stepladder are locked into place.  Ask someone to hold it for you, if possible.  Clearly mark and make sure that you can see:  Any grab bars or handrails.  First and last steps.  Where the edge of each  step is.  Use tools that help you move around (mobility aids) if they are needed. These include:  Canes.  Walkers.  Scooters.  Crutches.  Turn on the lights when you go into a dark area. Replace any light bulbs as soon as they burn out.  Set up your furniture so you have a clear path. Avoid moving your furniture around.  If any of your floors are uneven, fix them.  If there are any pets around you, be aware of where they are.  Review your medicines with your doctor. Some medicines can make you feel dizzy. This can increase your chance of falling. Ask your doctor what other things that you can do to help prevent falls. This information is not intended to replace advice given to you by your health care provider. Make sure you discuss any questions you have with your health care provider. Document Released: 07/05/2009 Document Revised: 02/14/2016 Document Reviewed: 10/13/2014 Elsevier Interactive Patient Education  2017 [employer].

## 2020-02-06 NOTE — Progress Notes (Addendum)
Subjective:   Jillian [name] is a 79 y.o. female who presents for Medicare Annual (Subsequent) preventive examination.  Review of Systems:   Cardiac Risk Factors include: advanced age (>70men, >88 women);diabetes mellitus;dyslipidemia;hypertension;obesity (BMI >30kg/m2);sedentary lifestyle     Objective:     Vitals: BP 128/68   Ht 5\' 4"  (1.626 m)   Wt 184 lb (83.5 kg)   BMI 31.58 kg/m   Body mass index is 31.58 kg/m.  Advanced Directives 11/09/2019 09/21/2019 08/09/2019 06/12/2019 02/07/2019 12/31/2018 08/18/2018  Does Patient Have a Medical Advance Directive? Yes Yes Yes Yes Yes No;Yes Yes  Type of Advance Directive Living will;Healthcare Power of [place];Living will Living will Living will;Healthcare Power of [place];Living will Living will  Does patient want to make changes to medical advance directive? No - Patient declined - No - Patient declined - No - Patient declined - No - Patient declined  Copy of Joy in Chart? No - copy requested - No - copy requested - No - copy requested - -  Would patient like information on creating a medical advance directive? - - No - Patient declined - - - -  Pre-existing out of facility DNR order (yellow form or pink MOST form) - - - - - - -    Tobacco Social History   Tobacco Use  Smoking Status Never Smoker  Smokeless Tobacco Never Used     Counseling given: Not Answered   Clinical Intake:  Pre-visit preparation completed: Yes  Pain : No/denies pain Pain Score: 0-No pain     Nutritional Status: BMI > 30  Obese Diabetes: Yes CBG done?: Yes CBG resulted in Enter/ Edit results?: Yes Did pt. bring in CBG monitor from home?: Yes  How often do you need to have someone help you when you read instructions, pamphlets, or other written materials from your doctor or pharmacy?: 1 - Never What is the last grade level you  completed in school?: 12th grade  Interpreter Needed?: No     Past Medical History:  Diagnosis Date  . Atypical ductal hyperplasia of breast   . Chronic fatigue   . Diabetes mellitus 2011  . DJD (degenerative joint disease)    of neck    . Dyslipidemia   . 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 Hypertension 2005  . Mild obesity   . Postmenopausal   . Viral meningitis   . Wears glasses    Past Surgical History:  Procedure Laterality Date  . ABDOMINAL HYSTERECTOMY  1992   fibroids  . APPENDECTOMY    . BREAST EXCISIONAL BIOPSY Right    benign  . BREAST LUMPECTOMY WITH RADIOACTIVE SEED LOCALIZATION Right 10/05/2014   Procedure: BREAST LUMPECTOMY WITH RADIOACTIVE SEED LOCALIZATION;  Surgeon: Erroll Luna, MD;  Location: [place];  Service: General;  Laterality: Right;  . cataract Bilateral   . COLONOSCOPY  2013   Dr. Oneida Alar :  internal hemorrhoids, mild left-sided diverticulosis  . COLONOSCOPY N/A 09/21/2019   Procedure: COLONOSCOPY;  Surgeon: Danie Binder, MD;  [facility];  Service: Endoscopy;  Laterality: N/A;  12:00pm  . CYST EXCISION Right    arm  . ESOPHAGOGASTRODUODENOSCOPY  2013   Dr. Oneida Alar: gastritis likely NSAID-induced. Duodenal polyp. Duodenitis and gastritis in setting of aspirin and Ibuprofen.   Marland Kitchen EYE SURGERY Right 06/07/2013  cataract  . EYE SURGERY Left 09/14/2013   cataract  . KNEE ARTHROSCOPY Right 02/26/2016   Procedure: ARTHROSCOPY RIGHT KNEE WITH MENSICAL DEBRIDEMENT;  Surgeon: Gaynelle Arabian, MD;  [facility];  Service: Orthopedics;  Laterality: Right;  . left breast biopsy    . LESION REMOVAL Right 03/30/2013   Procedure: EXCISION OF NEOPLASM ARM;  Surgeon: Jamesetta So, MD;  Location[facility];  Service: General;  Laterality: Right;  . TUBAL LIGATION     Family History  Problem Relation Age of Onset  . Hypertension Father   . Congestive Heart Failure  Father   . Liver cancer Mother   . Hypertension Sister   . Hypertension Brother   . Diabetes Brother   . Colon cancer Neg Hx   . Colon polyps Neg Hx    Social History   Socioeconomic History  . Marital status: Widowed    Spouse name: Not on file  . Number of children: Not on file  . Years of education: Not on file  . Highest education level: Not on file  Occupational History  . Not on file  Tobacco Use  . Smoking status: Never Smoker  . Smokeless tobacco: Never Used  Substance and Sexual Activity  . Alcohol use: No  . Drug use: No  . Sexual activity: Not Currently    Birth control/protection: Surgical  Other Topics Concern  . Not on file  Social History Narrative  . Not on file   Social Determinants of Health   Financial Resource Strain: Low Risk   . Difficulty of Paying Living Expenses: Not hard at all  Food Insecurity: No Food Insecurity  . Worried About Charity fundraiser in the Last Year: Never true  . Ran Out of Food in the Last Year: Never true  Transportation Needs: No Transportation Needs  . Lack of Transportation (Medical): No  . Lack of Transportation (Non-Medical): No  Physical Activity: Inactive  . Days of Exercise per Week: 0 days  . Minutes of Exercise per Session: 0 min  Stress: No Stress Concern Present  . Feeling of Stress : Only a little  Social Connections: Slightly Isolated  . Frequency of Communication with Friends and Family: More than three times a week  . Frequency of Social Gatherings with Friends and Family: More than three times a week  . Attends Religious Services: 1 to 4 times per year  . Active Member of Clubs or Organizations: Yes  . Attends Archivist Meetings: More than 4 times per year  . Marital Status: Widowed    Outpatient Encounter Medications as of 02/06/2020  Medication Sig  . acetaminophen (TYLENOL) 500 MG tablet Take 1,000 mg by mouth every 6 (six) hours as needed for mild pain.  Marland Kitchen amoxicillin-clavulanate  (AUGMENTIN) 875-125 MG tablet Take 1 tablet by mouth 2 (two) times daily. Take for 10 days  . aspirin EC 81 MG tablet Take 1 tablet (81 mg total) by mouth every evening. (Patient taking differently: Take 81 mg by mouth at bedtime. )  . benazepril (LOTENSIN) 5 MG tablet Take 1 tablet (5 mg total) by mouth daily.  . cloNIDine (CATAPRES) 0.2 MG tablet Take one tablet once daily at bedtime for blood pressure (Patient taking differently: Take 0.2 mg by mouth at bedtime. )  . cyclobenzaprine (FLEXERIL) 10 MG tablet TAKE 1 TABLET BY MOUTH AT BEDTIME AS NEEDED FOR BACK SPASM (Patient taking differently: Take 10 mg by mouth at bedtime as needed for muscle  spasms. )  . ergocalciferol (VITAMIN D2) 1.25 MG (50000 UT) capsule Take 1 capsule (50,000 Units total) by mouth once a week. (Patient taking differently: Take 50,000 Units by mouth once a week. Saturday)  . ferrous sulfate 325 (65 FE) MG EC tablet TAKE 1 TABLET EVERY DAY  . glucose blood test strip Use as instructed to check blood glucose once daily  . HYDROcodone-acetaminophen (NORCO/VICODIN) 5-325 MG tablet One tablet every four hours as needed for acute pain.  Limit of five days per [place] statue.  . metFORMIN (GLUCOPHAGE-XR) 500 MG 24 hr tablet TAKE 1 TABLET TWICE DAILY (Patient taking differently: Take 500 mg by mouth at bedtime. )  . ondansetron (ZOFRAN) 4 MG tablet Take 1 tablet (4 mg total) by mouth every 6 (six) hours.  . pantoprazole (PROTONIX) 40 MG tablet Take 1 tablet (40 mg total) by mouth daily. Take 30 minutes before breakfast (Patient taking differently: Take 40 mg by mouth at bedtime. Take 30 minutes before breakfast)  . pravastatin (PRAVACHOL) 10 MG tablet TAKE 1 TABLET EVERY DAY  . tamoxifen (NOLVADEX) 20 MG tablet TAKE 1 TABLET (20 MG TOTAL) BY MOUTH DAILY. (Patient taking differently: Take 20 mg by mouth at bedtime. )  . vitamin B-12 (CYANOCOBALAMIN) 1000 MCG tablet Take 1,000 mcg by mouth every Monday, Wednesday, and Friday.   No  facility-administered encounter medications on file as of 02/06/2020.    Activities of Daily Living In your present state of health, do you have any difficulty performing the following activities: 02/06/2020 09/01/2019  Hearing? N N  Vision? N N  Difficulty concentrating or making decisions? N Y  Walking or climbing stairs? Y Y  Dressing or bathing? N N  Doing errands, shopping? N N  Preparing Food and eating ? N N  Using the Toilet? N N  In the past six months, have you accidently leaked urine? N N  Do you have problems with loss of bowel control? N N  Managing your Medications? N N  Managing your Finances? N N  Housekeeping or managing your Housekeeping? N N  Some recent data might be hidden    Patient Care Team: Fayrene Helper, MD as PCP - General Fields, Marga Melnick, MD (Inactive) (Gastroenterology) Hurley Cisco, MD as Consulting Physician (Rheumatology) Whitney Muse, Kelby Fam, MD (Inactive) as Consulting Physician (Hematology and Oncology)    Assessment:   This is a routine wellness examination for Jillian [name].  Exercise Activities and Dietary recommendations Current Exercise Habits: The patient does not participate in regular exercise at present, Exercise limited by: orthopedic condition(s)  Goals    . DIET - INCREASE WATER INTAKE    . Exercise 3x per week (30 min per time)     Recommend starting a routine exercise program at least 3 days a week for 30-45 minutes at a time as tolerated.      Marland Kitchen LIFESTYLE - DECREASE FALLS RISK       Fall Risk Fall Risk  02/06/2020 01/05/2020 09/01/2019 07/18/2019 04/18/2019  Falls in the past year? 0 0 0 0 0  Number falls in past yr: 0 0 0 0 -  Injury with Fall? 0 0 0 0 0  Risk Factor Category  - - - - -  Risk for fall due to : - - - - -  Follow up - Falls evaluation completed - - -   Is the patient's home free of loose throw rugs in walkways, pet beds, electrical cords, etc?   yes  Grab bars in the bathroom? yes      Handrails on  the stairs?   yes      Adequate lighting?   yes  Timed Get Up and Go performed: unable to perform   Depression Screen PHQ 2/9 Scores 01/05/2020 09/01/2019 04/18/2019 03/15/2019  PHQ - 2 Score 0 0 0 0  PHQ- 9 Score - - - -     Cognitive Function     6CIT Screen 02/06/2020 09/01/2019 02/03/2019 01/11/2018 01/01/2017  What Year? 0 points 0 points 0 points 0 points 0 points  What month? 0 points 0 points 0 points 0 points 0 points  What time? 0 points 0 points 0 points 0 points 0 points  Count back from 20 0 points 0 points 4 points 0 points 0 points  Months in reverse 0 points 0 points 0 points 0 points 0 points  Repeat phrase 4 points 0 points 0 points 0 points 0 points  Total Score 4 0 4 0 0    Immunization History  Administered Date(s) Administered  . Fluad Quad(high Dose 65+) 05/18/2019  . H1N1 10/16/2008  . Influenza Whole 07/02/2007, 06/14/2009, 06/04/2010  . Influenza,inj,Quad PF,6+ Mos 07/20/2013, 06/01/2014, 06/26/2015, 07/28/2016, 05/19/2017  . Pneumococcal Conjugate-13 09/28/2014  . Pneumococcal Polysaccharide-23 04/20/2009  . Td 05/01/2004  . Zoster 10/23/2006    Qualifies for Shingles Vaccine? Qualifies and can check the pharmacy   Screening Tests Health Maintenance  Topic Date Due  . COVID-19 Vaccine (1) Never done  . TETANUS/TDAP  05/01/2014  . INFLUENZA VACCINE  04/22/2020  . FOOT EXAM  07/17/2020  . OPHTHALMOLOGY EXAM  07/19/2020  . HEMOGLOBIN A1C  08/01/2020  . DEXA SCAN  Completed  . PNA vac Low Risk Adult  Completed    Cancer Screenings: Lung: Low Dose CT Chest recommended if Age 42-80 years, 30 pack-year currently smoking OR have quit w/in 15years. Patient does not qualify. Breast:  Up to date on Mammogram? Yes   Up to date of Bone Density/Dexa? Yes Colorectal: up to date   Additional Screenings:: Hepatitis C Screening: once recommended     Plan:     I have personally reviewed and noted the following in the patient's chart:   . Medical and  social history . Use of alcohol, tobacco or illicit drugs  . Current medications and supplements . Functional ability and status . Nutritional status . Physical activity . Advanced directives . List of other physicians . Hospitalizations, surgeries, and ER visits in previous 12 months . Vitals . Screenings to include cognitive, depression, and falls . Referrals and appointments  In addition, I have reviewed and discussed with patient certain preventive protocols, quality metrics, and best practice recommendations. A written personalized care plan for preventive services as well as general preventive health recommendations were provided to patient.     Kate Sable, LPN, LPN  [phone]

## 2020-02-07 ENCOUNTER — Encounter: Payer: Medicare HMO | Admitting: Family Medicine

## 2020-02-09 ENCOUNTER — Encounter: Payer: Self-pay | Admitting: Orthopaedic Surgery

## 2020-02-09 ENCOUNTER — Ambulatory Visit (INDEPENDENT_AMBULATORY_CARE_PROVIDER_SITE_OTHER): Payer: Medicare HMO | Admitting: Orthopaedic Surgery

## 2020-02-09 ENCOUNTER — Other Ambulatory Visit: Payer: Self-pay

## 2020-02-09 VITALS — Ht 64.0 in | Wt 183.0 lb

## 2020-02-09 DIAGNOSIS — G8929 Other chronic pain: Secondary | ICD-10-CM | POA: Diagnosis not present

## 2020-02-09 DIAGNOSIS — M25511 Pain in right shoulder: Secondary | ICD-10-CM

## 2020-02-09 NOTE — Progress Notes (Signed)
PROCEDURE NOTE:  The patient request injection, verbal consent was obtained.  The right shoulder was prepped appropriately after time out was performed.   Sterile technique was observed and injection of 1 cc of Depo-Medrol 40 mg with several cc's of plain xylocaine. Anesthesia was provided by ethyl chloride and a 20-gauge needle was used to inject the shoulder area. A posterior approach was used.  The injection was tolerated well.  A band aid dressing was applied.  The patient was advised to apply ice later today and tomorrow to the injection sight as needed.  See as needed.  Electronically Signed Sanjuana Kava, MD 5/20/202110:01 AM

## 2020-02-27 ENCOUNTER — Telehealth (HOSPITAL_COMMUNITY): Payer: Self-pay | Admitting: *Deleted

## 2020-02-27 ENCOUNTER — Other Ambulatory Visit (HOSPITAL_COMMUNITY): Payer: Self-pay | Admitting: *Deleted

## 2020-02-27 DIAGNOSIS — N6091 Unspecified benign mammary dysplasia of right breast: Secondary | ICD-10-CM

## 2020-02-27 MED ORDER — TAMOXIFEN CITRATE 20 MG PO TABS: 20.0000 mg | ORAL_TABLET | Freq: Every day | ORAL | 4 refills | Status: DC

## 2020-03-03 ENCOUNTER — Ambulatory Visit
Admission: EM | Admit: 2020-03-03 | Discharge: 2020-03-03 | Disposition: A | Discharge status: Home / Self Care | Payer: Medicare HMO | Attending: Emergency Medicine | Admitting: Emergency Medicine

## 2020-03-03 ENCOUNTER — Other Ambulatory Visit: Payer: Self-pay

## 2020-03-03 DIAGNOSIS — Z8719 Personal history of other diseases of the digestive system: Secondary | ICD-10-CM

## 2020-03-03 DIAGNOSIS — R109 Unspecified abdominal pain: Secondary | ICD-10-CM | POA: Diagnosis not present

## 2020-03-03 DIAGNOSIS — R110 Nausea: Secondary | ICD-10-CM | POA: Diagnosis not present

## 2020-03-03 LAB — POCT URINALYSIS DIP (MANUAL ENTRY)
Bilirubin, UA: NEGATIVE
Glucose, UA: NEGATIVE mg/dL
Ketones, POC UA: NEGATIVE mg/dL
Nitrite, UA: NEGATIVE
Protein Ur, POC: NEGATIVE mg/dL
Spec Grav, UA: 1.025 (ref 1.010–1.025)
Urobilinogen, UA: 0.2 E.U./dL
pH, UA: 5.5 (ref 5.0–8.0)

## 2020-03-03 MED ORDER — ONDANSETRON 4 MG PO TBDP
4.0000 mg | ORAL_TABLET | Freq: Once | ORAL | Status: AC
  Administered 2020-03-03: 4 mg via ORAL

## 2020-03-03 MED ORDER — MOXIFLOXACIN HCL 400 MG PO TABS: 400.0000 mg | ORAL_TABLET | Freq: Every day | ORAL | 0 refills | Status: AC

## 2020-03-03 NOTE — Discharge Instructions (Signed)
Unable to rule out diverticulitis, GI bleed, intra-abdominal infection, etc... in urgent care setting.  Offered patient further evaluation and management in the ED.  Patient declines at this time and would like to try outpatient therapy first.  Aware of the risk associated with this decision including missed diagnosis, organ damage, organ failure, and/or death.  Patient aware and in agreement.     Given zofran in office for nausea Urine with white blood cells and blood.  We will culture your urine to make sure you do not have infection.  We will call you with abnormal results Get rest and drink fluids Due to history of diverticulitis and current symptoms will cover for diverticulitis If you experience new or worsening symptoms return or go to ER such as fever, chills, nausea, vomiting, diarrhea, bloody or dark tarry stools, constipation, urinary symptoms, worsening abdominal discomfort, symptoms that do not improve with medications, inability to keep fluids down, etc..Marland Kitchen

## 2020-03-03 NOTE — ED Provider Notes (Signed)
Jillian [name]   [id number] 03/03/20 Arrival Time: 68  CC: ABDOMINAL DISCOMFORT  SUBJECTIVE:  Jillian [name] is a 79 y.o. female who presents with complaint of nausea, and abdominal discomfort that began 1 day ago.  Denies a precipitating event, trauma, close contacts with similar symptoms, recent travel or antibiotic use.  Localizes pain to lower abdomen.  Describes as intermittent and dull in character.  Has not tried OTC medications.  Worse with eating.  Reports similar symptoms in the past with diverticulitis.  Last BM today.    Denies fever, chills, vomiting, chest pain, SOB, diarrhea, constipation, hematochezia, melena, dysuria, difficulty urinating, increased frequency or urgency, flank pain, loss of bowel or bladder function.  No LMP recorded. Patient has had a hysterectomy.  ROS: As per HPI.  All other pertinent ROS negative.     Past Medical History:  Diagnosis Date  . Atypical ductal hyperplasia of breast   . Chronic fatigue   . Diabetes mellitus 2011  . DJD (degenerative joint disease)    of neck    . Dyslipidemia   . 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 Hypertension 2005  . Mild obesity   . Postmenopausal   . Viral meningitis   . Wears glasses    Past Surgical History:  Procedure Laterality Date  . ABDOMINAL HYSTERECTOMY  1992   fibroids  . APPENDECTOMY    . BREAST EXCISIONAL BIOPSY Right    benign  . BREAST LUMPECTOMY WITH RADIOACTIVE SEED LOCALIZATION Right 10/05/2014   Procedure: BREAST LUMPECTOMY WITH RADIOACTIVE SEED LOCALIZATION;  Surgeon: Erroll Luna, MD;  Location: [place];  Service: General;  Laterality: Right;  . cataract Bilateral   . COLONOSCOPY  2013   Dr. Oneida Alar :  internal hemorrhoids, mild left-sided diverticulosis  . COLONOSCOPY N/A 09/21/2019   Procedure: COLONOSCOPY;  Surgeon: Danie Binder, MD;  [facility];  Service: Endoscopy;   Laterality: N/A;  12:00pm  . CYST EXCISION Right    arm  . ESOPHAGOGASTRODUODENOSCOPY  2013   Dr. Oneida Alar: gastritis likely NSAID-induced. Duodenal polyp. Duodenitis and gastritis in setting of aspirin and Ibuprofen.   Marland Kitchen EYE SURGERY Right 06/07/2013   cataract  . EYE SURGERY Left 09/14/2013   cataract  . KNEE ARTHROSCOPY Right 02/26/2016   Procedure: ARTHROSCOPY RIGHT KNEE WITH MENSICAL DEBRIDEMENT;  Surgeon: Gaynelle Arabian, MD;  [facility];  Service: Orthopedics;  Laterality: Right;  . left breast biopsy    . LESION REMOVAL Right 03/30/2013   Procedure: EXCISION OF NEOPLASM ARM;  Surgeon: Jamesetta So, MD;  [facility];  Service: General;  Laterality: Right;  . TUBAL LIGATION     Allergies  Allergen Reactions  . Codeine Nausea And Vomiting  . Propoxyphene N-Acetaminophen Other (See Comments)    hallucinations   No current facility-administered medications on file prior to encounter.   Current Outpatient Medications on File Prior to Encounter  Medication Sig Dispense Refill  . acetaminophen (TYLENOL) 500 MG tablet Take 1,000 mg by mouth every 6 (six) hours as needed for mild pain.    Marland Kitchen amoxicillin-clavulanate (AUGMENTIN) 875-125 MG tablet Take 1 tablet by mouth 2 (two) times daily. Take for 10 days    . aspirin EC 81 MG tablet Take 1 tablet (81 mg total) by mouth every evening. (Patient taking differently: Take 81 mg by mouth at bedtime. ) 90 tablet 1  . benazepril (LOTENSIN) 5  MG tablet Take 1 tablet (5 mg total) by mouth daily. 30 tablet 1  . cloNIDine (CATAPRES) 0.2 MG tablet Take one tablet once daily at bedtime for blood pressure (Patient taking differently: Take 0.2 mg by mouth at bedtime. ) 90 tablet 3  . cyclobenzaprine (FLEXERIL) 10 MG tablet TAKE 1 TABLET BY MOUTH AT BEDTIME AS NEEDED FOR BACK SPASM (Patient taking differently: Take 10 mg by mouth at bedtime as needed for muscle spasms. ) 20 tablet 0  . ergocalciferol (VITAMIN D2) 1.25 MG (50000 UT) capsule Take 1  capsule (50,000 Units total) by mouth once a week. (Patient taking differently: Take 50,000 Units by mouth once a week. Saturday) 16 capsule 4  . ferrous sulfate 325 (65 FE) MG EC tablet TAKE 1 TABLET EVERY DAY 90 tablet 2  . glucose blood test strip Use as instructed to check blood glucose once daily 100 each 0  . HYDROcodone-acetaminophen (NORCO/VICODIN) 5-325 MG tablet One tablet every four hours as needed for acute pain.  Limit of five days per [place] statue. 30 tablet 0  . metFORMIN (GLUCOPHAGE-XR) 500 MG 24 hr tablet TAKE 1 TABLET TWICE DAILY (Patient taking differently: Take 500 mg by mouth at bedtime. ) 180 tablet 0  . ondansetron (ZOFRAN) 4 MG tablet Take 1 tablet (4 mg total) by mouth every 6 (six) hours. (Patient not taking: Reported on 02/09/2020) 12 tablet 0  . pantoprazole (PROTONIX) 40 MG tablet Take 1 tablet (40 mg total) by mouth daily. Take 30 minutes before breakfast (Patient not taking: Reported on 02/09/2020) 90 tablet 3  . pravastatin (PRAVACHOL) 10 MG tablet TAKE 1 TABLET EVERY DAY 90 tablet 0  . tamoxifen (NOLVADEX) 20 MG tablet Take 1 tablet (20 mg total) by mouth daily. 90 tablet 4  . vitamin B-12 (CYANOCOBALAMIN) 1000 MCG tablet Take 1,000 mcg by mouth every Monday, Wednesday, and Friday.     Social History   Socioeconomic History  . Marital status: Widowed    Spouse name: Not on file  . Number of children: Not on file  . Years of education: Not on file  . Highest education level: Not on file  Occupational History  . Not on file  Tobacco Use  . Smoking status: Never Smoker  . Smokeless tobacco: Never Used  Vaping Use  . Vaping Use: Never used  Substance and Sexual Activity  . Alcohol use: No  . Drug use: No  . Sexual activity: Not Currently    Birth control/protection: Surgical  Other Topics Concern  . Not on file  Social History Narrative  . Not on file   Social Determinants of Health   Financial Resource Strain: Low Risk   . Difficulty of Paying  Living Expenses: Not hard at all  Food Insecurity: No Food Insecurity  . Worried About Charity fundraiser in the Last Year: Never true  . Ran Out of Food in the Last Year: Never true  Transportation Needs: No Transportation Needs  . Lack of Transportation (Medical): No  . Lack of Transportation (Non-Medical): No  Physical Activity: Inactive  . Days of Exercise per Week: 0 days  . Minutes of Exercise per Session: 0 min  Stress: No Stress Concern Present  . Feeling of Stress : Only a little  Social Connections: Moderately Integrated  . Frequency of Communication with Friends and Family: More than three times a week  . Frequency of Social Gatherings with Friends and Family: More than three times a week  .  Attends Religious Services: 1 to 4 times per year  . Active Member of Clubs or Organizations: Yes  . Attends Archivist Meetings: More than 4 times per year  . Marital Status: Widowed  Intimate Partner Violence:   . Fear of Current or Ex-Partner:   . Emotionally Abused:   Marland Kitchen Physically Abused:   . Sexually Abused:    Family History  Problem Relation Age of Onset  . Hypertension Father   . Congestive Heart Failure Father   . Liver cancer Mother   . Hypertension Sister   . Hypertension Brother   . Diabetes Brother   . Colon cancer Neg Hx   . Colon polyps Neg Hx      OBJECTIVE:  Vitals:   03/03/20 1438  BP: (!) 151/90  Pulse: (!) 102  Resp: 18  Temp: 98.6 F (37 C)  SpO2: 95%    General appearance: Alert; NAD HEENT: NCAT.  Oropharynx clear.  Lungs: clear to auscultation bilaterally without adventitious breath sounds Heart: regular rate and rhythm.   Abdomen: soft, non-distended; normal active bowel sounds; mild diffuse tenderness to palpation over lower abdomen; nontender at McBurney's point; negative Murphy's sign; no guarding Extremities: no edema; symmetrical with no gross deformities Skin: warm and dry Neurologic: normal gait Psychological: alert and  cooperative; normal mood and affect  LABS: Results for orders placed or performed during the hospital encounter of 03/03/20 (from the past 24 hour(s))  POCT urinalysis dipstick     Status: Abnormal   Collection Time: 03/03/20  3:20 PM  Result Value Ref Range   Color, UA yellow yellow   Clarity, UA clear clear   Glucose, UA negative negative mg/dL   Bilirubin, UA negative negative   Ketones, POC UA negative negative mg/dL   Spec Grav, UA 1.025 1.010 - 1.025   Blood, UA moderate (A) negative   pH, UA 5.5 5.0 - 8.0   Protein Ur, POC negative negative mg/dL   Urobilinogen, UA 0.2 0.2 or 1.0 E.U./dL   Nitrite, UA Negative Negative   Leukocytes, UA Trace (A) Negative    ASSESSMENT & PLAN:  1. Abdominal discomfort   2. Nausea without vomiting   3. Hx of diverticulitis of colon     Meds ordered this encounter  Medications  . ondansetron (ZOFRAN-ODT) disintegrating tablet 4 mg  . moxifloxacin (AVELOX) 400 MG tablet    Sig: Take 1 tablet (400 mg total) by mouth daily at 8 pm for 7 days.    Dispense:  7 tablet    Refill:  0    Order Specific Question:   Supervising Provider    Answer:   Raylene Everts [[id number]]   Unable to rule out diverticulitis, GI bleed, intra-abdominal infection, etc... in urgent care setting.  Offered patient further evaluation and management in the ED.  Patient declines at this time and would like to try outpatient therapy first.  Aware of the risk associated with this decision including missed diagnosis, organ damage, organ failure, and/or death.  Patient aware and in agreement.     Given zofran in office for nausea Urine with white blood cells and blood.  We will culture your urine to make sure you do not have infection.  We will call you with abnormal results Get rest and drink fluids Due to history of diverticulitis and current symptoms will cover for diverticulitis If you experience new or worsening symptoms return or go to ER such as fever, chills,  nausea, vomiting, diarrhea, bloody  or dark tarry stools, constipation, urinary symptoms, worsening abdominal discomfort, symptoms that do not improve with medications, inability to keep fluids down, etc...  Reviewed expectations re: course of current medical issues. Questions answered. Outlined signs and symptoms indicating need for more acute intervention. Patient verbalized understanding. After Visit Summary given.   Lestine Box, PA-C 03/03/20 1536

## 2020-03-03 NOTE — ED Triage Notes (Signed)
Pt presents with c/o nausea that began yesterday with intermittent abdominal pain. Currently denies pain. Hs not vomited. Appetite is poor

## 2020-03-04 LAB — URINE CULTURE: Culture: NO GROWTH

## 2020-03-15 ENCOUNTER — Other Ambulatory Visit: Payer: Self-pay

## 2020-03-15 ENCOUNTER — Ambulatory Visit (INDEPENDENT_AMBULATORY_CARE_PROVIDER_SITE_OTHER): Payer: Medicare HMO | Admitting: Family Medicine

## 2020-03-15 ENCOUNTER — Encounter: Payer: Self-pay | Admitting: Family Medicine

## 2020-03-15 VITALS — BP 124/83 | HR 85 | Temp 97.4°F | Resp 16 | Ht 64.0 in | Wt 183.0 lb

## 2020-03-15 DIAGNOSIS — E1159 Type 2 diabetes mellitus with other circulatory complications: Secondary | ICD-10-CM | POA: Diagnosis not present

## 2020-03-15 DIAGNOSIS — K529 Noninfective gastroenteritis and colitis, unspecified: Secondary | ICD-10-CM

## 2020-03-15 DIAGNOSIS — E6609 Other obesity due to excess calories: Secondary | ICD-10-CM | POA: Diagnosis not present

## 2020-03-15 DIAGNOSIS — G8929 Other chronic pain: Secondary | ICD-10-CM

## 2020-03-15 DIAGNOSIS — Z6830 Body mass index (BMI) 30.0-30.9, adult: Secondary | ICD-10-CM

## 2020-03-15 DIAGNOSIS — I10 Essential (primary) hypertension: Secondary | ICD-10-CM | POA: Diagnosis not present

## 2020-03-15 DIAGNOSIS — M25511 Pain in right shoulder: Secondary | ICD-10-CM | POA: Diagnosis not present

## 2020-03-15 NOTE — Assessment & Plan Note (Signed)
Controlled, no change in medication DASH diet and commitment to daily physical activity for a minimum of 30 minutes discussed and encouraged, as a part of hypertension management. The importance of attaining a healthy weight is also discussed.  BP/Weight 03/15/2020 03/03/2020 02/09/2020 02/06/2020 01/05/2020 12/22/2019 1/74/0814  Systolic BP 481 856 - 314 970 263 785  Diastolic BP 83 90 - 68 68 74 80  Wt. (Lbs) 183 - 183 184 184.2 182 188  BMI 31.41 - 31.41 31.58 31.62 31.24 32.27

## 2020-03-15 NOTE — Progress Notes (Signed)
Jillian [name]     MRN: [id number]      DOB: 14-Nov-1940   HPI [name] is here for follow up of recent urgent care visit where she was treated for presumed medically acute diverticulitis.  Urine culture which was sent at that visit was negative for any urinary tract infection.  [name] states that she no longer has any abdominal pain and has had no nausea or vomiting since she presented on June 12 to the urgent care.  She denies any fever or chills and reports her normal appetite.  She denies any change in stool caliber color or pattern.  She denies any blood or mucus in the stool.  She complains of ongoing right shoulder pain states she has had 3 injections into the shoulder for this by orthopedics and says that the third one did not do anything.  At this time she is not interested in returning to orthopedics for further evaluation and potential surgical intervention, states she will continue to watch it at home and do some gentle shoulder exercises. Denies polyuria, polydipsia, blurred vision , or hypoglycemic episodes.  ROS Denies recent fever or chills. Denies sinus pressure, nasal congestion, ear pain or sore throat. Denies chest congestion, productive cough or wheezing. Denies chest pains, palpitations and leg swelling Denies abdominal pain, nausea, vomiting,diarrhea or constipation.   Denies dysuria, frequency, hesitancy or incontinence.  Denies headaches, seizures, numbness, or tingling. Denies depression, anxiety or insomnia. Denies skin break down or rash.   PE  BP 124/83   Pulse 85   Temp (!) 97.4 F (36.3 C) (Temporal)   Resp 16   Ht 5\' 4"  (1.626 m)   Wt 183 lb (83 kg)   SpO2 97%   BMI 31.41 kg/m   Patient alert and oriented and in no cardiopulmonary distress.  HEENT: No facial asymmetry, EOMI,     Neck supple .  Chest: Clear to auscultation bilaterally.  CVS: S1, S2 no murmurs, no S3.Regular rate.  ABD: Soft non tender.  No guarding or rebound.   No palpable organomegaly or mass.  Normal bowel sounds.  Ext: No edema  MS: Adequate ROM spine, hips and knees reduced in the right shoulder.  Skin: Intact, no ulcerations or rash noted.  Psych: Good eye contact, normal affect. Memory intact not anxious or depressed appearing.  CNS: CN 2-12 intact, power,  normal throughout.no focal deficits noted.   Assessment & Plan  Colitis presumable had a flare on 03/03/2020 which has completely resolved  ESSENTIAL HYPERTENSION, BENIGN Controlled, no change in medication DASH diet and commitment to daily physical activity for a minimum of 30 minutes discussed and encouraged, as a part of hypertension management. The importance of attaining a healthy weight is also discussed.  BP/Weight 03/15/2020 03/03/2020 02/09/2020 02/06/2020 01/05/2020 12/22/2019 11/01/4707  Systolic BP 628 366 - 294 765 465 035  Diastolic BP 83 90 - 68 68 74 80  Wt. (Lbs) 183 - 183 184 184.2 182 188  BMI 31.41 - 31.41 31.58 31.62 31.24 32.27       Type 2 diabetes mellitus with vascular disease (HCC) Controlled, no change in medication [name] is reminded of the importance of commitment to daily physical activity for 30 minutes or more, as able and the need to limit carbohydrate intake to 30 to 60 grams per meal to help with blood sugar control.   The need to take medication as prescribed, test blood sugar as directed, and to call between visits if  there is a concern that blood sugar is uncontrolled is also discussed.   [name] is reminded of the importance of daily foot exam, annual eye examination, and good blood sugar, blood pressure and cholesterol control.  Diabetic Labs Latest Ref Rng & Units 01/30/2020 11/02/2019 08/02/2019 07/18/2019 06/12/2019  HbA1c <5.7 % of total Hgb 6.5(H) - - - -  Microalbumin mg/dL 1.3 - - - -  Micro/Creat Ratio <30 mcg/mg creat 4 - - - -  Chol <200 mg/dL 114 - - 118 -  HDL > OR = 50 mg/dL 48(L) - - 46(L) -  Calc LDL mg/dL (calc)  48 - - 50 -  Triglycerides <150 mg/dL 99 - - 137 -  Creatinine 0.60 - 0.93 mg/dL 0.99(H) 1.13(H) 1.62(H) 0.97(H) 1.03(H)   BP/Weight 03/15/2020 03/03/2020 02/09/2020 02/06/2020 01/05/2020 12/22/2019 2/50/0370  Systolic BP 488 891 - 694 503 888 280  Diastolic BP 83 90 - 68 68 74 80  Wt. (Lbs) 183 - 183 184 184.2 182 188  BMI 31.41 - 31.41 31.58 31.62 31.24 32.27   Foot/eye exam completion dates Latest Ref Rng & Units 07/20/2019 07/18/2019  Eye Exam No Retinopathy No Retinopathy -  Foot exam Order - - -  Foot Form Completion - - Done        Obese  Patient re-educated about  the importance of commitment to a  minimum of 150 minutes of exercise per week as able.  The importance of healthy food choices with portion control discussed, as well as eating regularly and within a 12 hour window most days. The need to choose "clean , green" food 50 to 75% of the time is discussed, as well as to make water the primary drink and set a goal of 64 ounces water daily.    Weight /BMI 03/15/2020 02/09/2020 02/06/2020  WEIGHT 183 lb 183 lb 184 lb  HEIGHT 5\' 4"  5\' 4"  5\' 4"   BMI 31.41 kg/m2 31.41 kg/m2 31.58 kg/m2      Shoulder pain, right Ongoing pain with reduced mobility.  Patient to follow-up with orthopedics as needed.  She will start regular shoulder exercises and information is provided in writing also about the exercise.

## 2020-03-15 NOTE — Assessment & Plan Note (Signed)
Ongoing pain with reduced mobility.  Patient to follow-up with orthopedics as needed.  She will start regular shoulder exercises and information is provided in writing also about the exercise.

## 2020-03-15 NOTE — Assessment & Plan Note (Signed)
Controlled, no change in medication Jillian [name] is reminded of the importance of commitment to daily physical activity for 30 minutes or more, as able and the need to limit carbohydrate intake to 30 to 60 grams per meal to help with blood sugar control.   The need to take medication as prescribed, test blood sugar as directed, and to call between visits if there is a concern that blood sugar is uncontrolled is also discussed.   Jillian [name] is reminded of the importance of daily foot exam, annual eye examination, and good blood sugar, blood pressure and cholesterol control.  Diabetic Labs Latest Ref Rng & Units 01/30/2020 11/02/2019 08/02/2019 07/18/2019 06/12/2019  HbA1c <5.7 % of total Hgb 6.5(H) - - - -  Microalbumin mg/dL 1.3 - - - -  Micro/Creat Ratio <30 mcg/mg creat 4 - - - -  Chol <200 mg/dL 114 - - 118 -  HDL > OR = 50 mg/dL 48(L) - - 46(L) -  Calc LDL mg/dL (calc) 48 - - 50 -  Triglycerides <150 mg/dL 99 - - 137 -  Creatinine 0.60 - 0.93 mg/dL 0.99(H) 1.13(H) 1.62(H) 0.97(H) 1.03(H)   BP/Weight 03/15/2020 03/03/2020 02/09/2020 02/06/2020 01/05/2020 12/22/2019 5/42/7062  Systolic BP 376 283 - 151 761 607 371  Diastolic BP 83 90 - 68 68 74 80  Wt. (Lbs) 183 - 183 184 184.2 182 188  BMI 31.41 - 31.41 31.58 31.62 31.24 32.27   Foot/eye exam completion dates Latest Ref Rng & Units 07/20/2019 07/18/2019  Eye Exam No Retinopathy No Retinopathy -  Foot exam Order - - -  Foot Form Completion - - Done

## 2020-03-15 NOTE — Patient Instructions (Addendum)
Al physical exam with MD last week in August ,  Call if you need me sooner. GlycoHb at visit and flu vaccine   Thankful that abdominal pain is gone  Please do shoulder exercises at home as you are able.  If the shoulder pain continues or worsens please contact Dr. Luna Glasgow so he will know that you need additional help for your right shoulder.  It is important that you exercise regularly at least 30 minutes 5 times a week. If you develop chest pain, have severe difficulty breathing, or feel very tired, stop exercising immediately and seek medical attention    Shoulder Range of Motion Exercises Shoulder range of motion (ROM) exercises are done to keep the shoulder moving freely or to increase movement. They are often recommended for people who have shoulder pain or stiffness or who are recovering from a shoulder surgery. Phase 1 exercises When you are able, do this exercise 1-2 times per day for 30-60 seconds in each direction, or as directed by your health care provider. Pendulum exercise To do this exercise while sitting: 1. Sit in a chair or at the edge of your bed with your feet flat on the floor. 2. Let your affected arm hang down in front of you over the edge of the bed or chair. 3. Relax your shoulder, arm, and hand. [place] your body so your arm gently swings in small circles. You can also use your unaffected arm to start the motion. 5. Repeat changing the direction of the circles, swinging your arm left and right, and swinging your arm forward and back. To do this exercise while standing: 1. Stand next to a sturdy chair or table, and hold on to it with your hand on your unaffected side. 2. Bend forward at the waist. 3. Bend your knees slightly. 4. Relax your shoulder, arm, and hand. 5. While keeping your shoulder relaxed, use body motion to swing your arm in small circles. 6. Repeat changing the direction of the circles, swinging your arm left and right, and swinging your arm  forward and back. 7. Between exercises, stand up tall and take a short break to relax your lower back.  Phase 2 exercises Do these exercises 1-2 times per day or as told by your health care provider. Hold each stretch for 30 seconds, and repeat 3 times. Do the exercises with one or both arms as instructed by your health care provider. For these exercises, sit at a table with your hand and arm supported by the table. A chair that slides easily or has wheels can be helpful. External rotation 1. Turn your chair so that your affected side is nearest to the table. 2. Place your forearm on the table to your side. Bend your elbow about 90 at the elbow (right angle) and place your hand palm facing down on the table. Your elbow should be about 6 inches away from your side. 3. Keeping your arm on the table, lean your body forward. Abduction 1. Turn your chair so that your affected side is nearest to the table. 2. Place your forearm and hand on the table so that your thumb points toward the ceiling and your arm is straight out to your side. 3. Slide your hand out to the side and away from you, using your unaffected arm to do the work. 4. To increase the stretch, you can slide your chair away from the table. Flexion: forward stretch 1. Sit facing the table. Place your hand and elbow  on the table in front of you. 2. Slide your hand forward and away from you, using your unaffected arm to do the work. 3. To increase the stretch, you can slide your chair backward. Phase 3 exercises Do these exercises 1-2 times per day or as told by your health care provider. Hold each stretch for 30 seconds, and repeat 3 times. Do the exercises with one or both arms as instructed by your health care provider. Cross-body stretch: posterior capsule stretch 1. Lift your arm straight out in front of you. 2. Bend your arm 90 at the elbow (right angle) so your forearm moves across your body. 3. Use your other arm to gently pull  the elbow across your body, toward your other shoulder. Wall climbs 1. Stand with your affected arm extended out to the side with your hand resting on a door frame. 2. Slide your hand slowly up the door frame. 3. To increase the stretch, step through the door frame. Keep your body upright and do not lean. Wand exercises You will need a cane, a piece of PVC pipe, or a sturdy wooden dowel for wand exercises. Flexion To do this exercise while standing: 1. Hold the wand with both of your hands, palms down. 2. Using the other arm to help, lift your arms up and over your head, if able. 3. Push upward with your other arm to gently increase the stretch. To do this exercise while lying down: 1. Lie on your back with your elbows resting on the floor and the wand in both your hands. Your hands will be palm down, or pointing toward your feet. 2. Lift your hands toward the ceiling, using your unaffected arm to help if needed. 3. Bring your arms overhead as able, using your unaffected arm to help if needed. Internal rotation 1. Stand while holding the wand behind you with both hands. Your unaffected arm should be extended above your head with the arm of the affected side extended behind you at the level of your waist. The wand should be pointing straight up and down as you hold it. 2. Slowly pull the wand up behind your back by straightening the elbow of your unaffected arm and bending the elbow of your affected arm. External rotation 1. Lie on your back with your affected upper arm supported on a small pillow or rolled towel. When you first do this exercise, keep your upper arm close to your body. Over time, bring your arm up to a 90 angle out to the side. 2. Hold the wand across your stomach and with both hands palm up. Your elbow on your affected side should be bent at a 90 angle. 3. Use your unaffected side to help push your forearm away from you and toward the floor. Keep your elbow on your affected  side bent at a 90 angle. Contact a health care provider if you have:  New or increasing pain.  New numbness, tingling, weakness, or discoloration in your arm or hand. This information is not intended to replace advice given to you by your health care provider. Make sure you discuss any questions you have with your health care provider. Document Revised: 10/21/2017 Document Reviewed: 10/21/2017 Elsevier Patient Education  2020 [employer].

## 2020-03-15 NOTE — Assessment & Plan Note (Signed)
  Patient re-educated about  the importance of commitment to a  minimum of 150 minutes of exercise per week as able.  The importance of healthy food choices with portion control discussed, as well as eating regularly and within a 12 hour window most days. The need to choose "clean , green" food 50 to 75% of the time is discussed, as well as to make water the primary drink and set a goal of 64 ounces water daily.    Weight /BMI 03/15/2020 02/09/2020 02/06/2020  WEIGHT 183 lb 183 lb 184 lb  HEIGHT 5\' 4"  5\' 4"  5\' 4"   BMI 31.41 kg/m2 31.41 kg/m2 31.58 kg/m2

## 2020-03-15 NOTE — Assessment & Plan Note (Signed)
presumable had a flare on 03/03/2020 which has completely resolved

## 2020-04-01 ENCOUNTER — Other Ambulatory Visit: Payer: Self-pay

## 2020-04-01 ENCOUNTER — Emergency Department (HOSPITAL_COMMUNITY): Payer: Medicare HMO

## 2020-04-01 ENCOUNTER — Emergency Department (HOSPITAL_COMMUNITY)
Admission: EM | Admit: 2020-04-01 | Discharge: 2020-04-01 | Disposition: A | Discharge status: Home / Self Care | Payer: Medicare HMO | Attending: Emergency Medicine | Admitting: Emergency Medicine

## 2020-04-01 ENCOUNTER — Encounter (HOSPITAL_COMMUNITY): Payer: Self-pay | Admitting: Emergency Medicine

## 2020-04-01 DIAGNOSIS — I10 Essential (primary) hypertension: Secondary | ICD-10-CM | POA: Diagnosis not present

## 2020-04-01 DIAGNOSIS — Z79899 Other long term (current) drug therapy: Secondary | ICD-10-CM | POA: Diagnosis not present

## 2020-04-01 DIAGNOSIS — R072 Precordial pain: Secondary | ICD-10-CM

## 2020-04-01 DIAGNOSIS — E1151 Type 2 diabetes mellitus with diabetic peripheral angiopathy without gangrene: Secondary | ICD-10-CM | POA: Insufficient documentation

## 2020-04-01 DIAGNOSIS — Z7982 Long term (current) use of aspirin: Secondary | ICD-10-CM | POA: Diagnosis not present

## 2020-04-01 DIAGNOSIS — R0789 Other chest pain: Secondary | ICD-10-CM | POA: Diagnosis not present

## 2020-04-01 DIAGNOSIS — Z7984 Long term (current) use of oral hypoglycemic drugs: Secondary | ICD-10-CM | POA: Insufficient documentation

## 2020-04-01 DIAGNOSIS — R079 Chest pain, unspecified: Secondary | ICD-10-CM | POA: Diagnosis not present

## 2020-04-01 LAB — CBC
HCT: 36.8 % (ref 36.0–46.0)
Hemoglobin: 11.5 g/dL — ABNORMAL LOW (ref 12.0–15.0)
MCH: 30.7 pg (ref 26.0–34.0)
MCHC: 31.3 g/dL (ref 30.0–36.0)
MCV: 98.1 fL (ref 80.0–100.0)
Platelets: 166 10*3/uL (ref 150–400)
RBC: 3.75 MIL/uL — ABNORMAL LOW (ref 3.87–5.11)
RDW: 13.9 % (ref 11.5–15.5)
WBC: 8.8 10*3/uL (ref 4.0–10.5)
nRBC: 0 % (ref 0.0–0.2)

## 2020-04-01 LAB — BASIC METABOLIC PANEL
Anion gap: 9 (ref 5–15)
BUN: 12 mg/dL (ref 8–23)
CO2: 25 mmol/L (ref 22–32)
Calcium: 8.9 mg/dL (ref 8.9–10.3)
Chloride: 106 mmol/L (ref 98–111)
Creatinine, Ser: 1.08 mg/dL — ABNORMAL HIGH (ref 0.44–1.00)
GFR calc Af Amer: 57 mL/min — ABNORMAL LOW (ref >60)
GFR calc non Af Amer: 49 mL/min — ABNORMAL LOW (ref >60)
Glucose, Bld: 135 mg/dL — ABNORMAL HIGH (ref 70–99)
Potassium: 3.2 mmol/L — ABNORMAL LOW (ref 3.5–5.1)
Sodium: 140 mmol/L (ref 135–145)

## 2020-04-01 LAB — TROPONIN I (HIGH SENSITIVITY): Troponin I (High Sensitivity): 3 ng/L (ref <18)

## 2020-04-01 MED ORDER — SODIUM CHLORIDE 0.9% FLUSH: 3.0000 mL | Freq: Once | INTRAVENOUS | Status: DC

## 2020-04-01 NOTE — Discharge Instructions (Addendum)
Return for new or worse symptoms with the chest pain.  Make a appointment to follow-up with cardiology call on Monday to set up the appointment.  Start taking a baby aspirin a day.  If you are not already.  Today's work-up for the chest pain without any acute findings.

## 2020-04-01 NOTE — ED Provider Notes (Signed)
[facility] EMERGENCY DEPARTMENT Provider Note   CSN: [id number] Arrival date & time: 04/01/20  1644     History Chief Complaint  Patient presents with  . Chest Pain    Jillian [name] is a 79 y.o. female.  In addition patient's chest pain is been right-sided present for more than 3 days constantly.  Not associated with nausea vomiting no shortness of breath.  Is worse with some movement particularly laying on her right side.  Therefore somewhat suggestive of chest wall pain.     HPI: A 79 year old patient with a history of treated diabetes and hypertension presents for evaluation of chest pain. Initial onset of pain was more than 6 hours ago. The patient's chest pain is sharp and is not worse with exertion. The patient's chest pain is not middle- or left-sided, is not well-localized, is not described as heaviness/pressure/tightness and does not radiate to the arms/jaw/neck. The patient does not complain of nausea and denies diaphoresis. The patient has no history of stroke, has no history of peripheral artery disease, has not smoked in the past 90 days, has no relevant family history of coronary artery disease (first degree relative at less than age 44), has no history of hypercholesterolemia and does not have an elevated BMI (>=30).   Past Medical History:  Diagnosis Date  . Atypical ductal hyperplasia of breast   . Chronic fatigue   . Diabetes mellitus 2011  . DJD (degenerative joint disease)    of neck    . Dyslipidemia   . 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 Hypertension 2005  . Mild obesity   . Postmenopausal   . Rectal bleeding   . Viral meningitis   . Wears glasses     Patient Active Problem List   Diagnosis Date Noted  . Patellofemoral arthralgia of both knees 12/07/2019  . Colitis 06/14/2019  . Pancreatic lesion 06/14/2019  . Chronic headache 04/03/2019  . Educated about COVID-19 virus infection  02/06/2019  . Obese 02/06/2019  . Thoracic back pain 11/08/2018  . 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 Impaired mobility and ADLs 06/11/2018  . Hypertension 06/11/2018  . Thrombocytopenia ([place]) 06/11/2018  . 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 Viral meningitis 06/03/2018  . Back pain with right-sided radiculopathy 04/28/2018  . Longitudinal split nail 02/15/2018  . Closed fracture of phalanx of foot 11/28/2017  . Impingement syndrome of left shoulder region 11/09/2017  . Chronic left SI joint pain 05/21/2016  . Primary osteoarthritis of both knees 06/26/2015  . 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 Thoracic spine pain 09/28/2014  . Shoulder pain, right 01/13/2014  . Hip pain 08/06/2012  . Chest wall pain 12/17/2011  . 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 Hot flashes, menopausal 09/10/2011  . Type 2 diabetes mellitus with vascular disease ([place]) 01/08/2010  . INSOMNIA, CHRONIC 11/28/2009  . Hyperlipidemia with target LDL less than 100 10/12/2007  . ESSENTIAL HYPERTENSION, BENIGN 10/12/2007    Past Surgical History:  Procedure Laterality Date  . ABDOMINAL HYSTERECTOMY  1992   fibroids  . APPENDECTOMY    . BREAST EXCISIONAL BIOPSY Right    benign  . BREAST LUMPECTOMY WITH RADIOACTIVE SEED LOCALIZATION Right 10/05/2014   Procedure: BREAST LUMPECTOMY WITH RADIOACTIVE SEED LOCALIZATION;  Surgeon: Erroll Luna, MD;  Location: [place];  Service: General;  Laterality: Right;  . cataract Bilateral   .  COLONOSCOPY  2013   Dr. Oneida Alar :  internal hemorrhoids, mild left-sided diverticulosis  . COLONOSCOPY N/A 09/21/2019   Procedure: COLONOSCOPY;  Surgeon: Danie Binder, MD;  Location: [facility];  Service: Endoscopy;  Laterality: N/A;  12:00pm  . CYST EXCISION Right    arm  . ESOPHAGOGASTRODUODENOSCOPY  2013   Dr. Oneida Alar: gastritis likely NSAID-induced. Duodenal polyp. Duodenitis and gastritis in setting of  aspirin and Ibuprofen.   Marland Kitchen EYE SURGERY Right 06/07/2013   cataract  . EYE SURGERY Left 09/14/2013   cataract  . KNEE ARTHROSCOPY Right 02/26/2016   Procedure: ARTHROSCOPY RIGHT KNEE WITH MENSICAL DEBRIDEMENT;  Surgeon: Gaynelle Arabian, MD;  Location: [facility];  Service: Orthopedics;  Laterality: Right;  . left breast biopsy    . LESION REMOVAL Right 03/30/2013   Procedure: EXCISION OF NEOPLASM ARM;  Surgeon: Jamesetta So, MD;  Location: [facility];  Service: General;  Laterality: Right;  . TUBAL LIGATION       OB History    Gravida  2   Para  2   Term  2   Preterm      AB      Living        SAB      TAB      Ectopic      Multiple      Live Births              Family History  Problem Relation Age of Onset  . Hypertension Father   . Congestive Heart Failure Father   . Liver cancer Mother   . Hypertension Sister   . Hypertension Brother   . Diabetes Brother   . Colon cancer Neg Hx   . Colon polyps Neg Hx     Social History   Tobacco Use  . Smoking status: Never Smoker  . Smokeless tobacco: Never Used  Vaping Use  . Vaping Use: Never used  Substance Use Topics  . Alcohol use: No  . Drug use: No    Home Medications Prior to Admission medications   Medication Sig Start Date End Date Taking? Authorizing Provider  acetaminophen (TYLENOL) 500 MG tablet Take 1,000 mg by mouth every 6 (six) hours as needed for mild pain.    [provider]  aspirin EC 81 MG tablet Take 1 tablet (81 mg total) by mouth every evening. Patient taking differently: Take 81 mg by mouth at bedtime.  04/23/16   Fayrene Helper, MD  benazepril (LOTENSIN) 5 MG tablet Take 1 tablet (5 mg total) by mouth daily. 01/09/20   Corum, Rex Kras, MD  cloNIDine (CATAPRES) 0.2 MG tablet Take one tablet once daily at bedtime for blood pressure Patient taking differently: Take 0.2 mg by mouth at bedtime.  04/18/19   Fayrene Helper, MD  cyclobenzaprine (FLEXERIL) 10 MG tablet TAKE 1 TABLET BY  MOUTH AT BEDTIME AS NEEDED FOR BACK SPASM Patient taking differently: Take 10 mg by mouth at bedtime as needed for muscle spasms.  05/23/19   Fayrene Helper, MD  ergocalciferol (VITAMIN D2) 1.25 MG (50000 UT) capsule Take 1 capsule (50,000 Units total) by mouth once a week. Patient taking differently: Take 50,000 Units by mouth once a week. Saturday 09/01/19   Glennie Isle, NP-C  ferrous sulfate 325 (65 FE) MG EC tablet TAKE 1 TABLET EVERY DAY 11/01/19   Lockamy, Randi L, NP-C  glucose blood test strip Use as instructed to check blood glucose once daily  09/08/19   Fayrene Helper, MD  HYDROcodone-acetaminophen (NORCO/VICODIN) 5-325 MG tablet One tablet every four hours as needed for acute pain.  Limit of five days per [place]. 12/22/19   Sanjuana Kava, MD  metFORMIN (GLUCOPHAGE-XR) 500 MG 24 hr tablet TAKE 1 TABLET TWICE DAILY Patient taking differently: Take 500 mg by mouth at bedtime.  09/01/19   Fayrene Helper, MD  pravastatin (PRAVACHOL) 10 MG tablet TAKE 1 TABLET EVERY DAY 11/15/19   Fayrene Helper, MD  tamoxifen (NOLVADEX) 20 MG tablet Take 1 tablet (20 mg total) by mouth daily. 02/27/20   Derek Jack, MD  vitamin B-12 (CYANOCOBALAMIN) 1000 MCG tablet Take 1,000 mcg by mouth every Monday, Wednesday, and Friday.    [provider]    Allergies    Codeine and Propoxyphene n-acetaminophen  Review of Systems   Review of Systems  Constitutional: Negative for chills and fever.  HENT: Negative for congestion, rhinorrhea and sore throat.   Eyes: Negative for visual disturbance.  Respiratory: Negative for cough and shortness of breath.   Cardiovascular: Positive for chest pain. Negative for leg swelling.  Gastrointestinal: Negative for abdominal pain, diarrhea, nausea and vomiting.  Genitourinary: Negative for dysuria.  Musculoskeletal: Negative for back pain and neck pain.  Skin: Negative for rash.  Neurological: Negative for dizziness,  light-headedness and headaches.  Hematological: Does not bruise/bleed easily.  Psychiatric/Behavioral: Negative for confusion.    Physical Exam Updated Vital Signs BP 133/74   Pulse 85   Temp 98.7 F (37.1 C) (Oral)   Resp (!) 24   Ht 1.626 m (5\' 4" )   Wt 82.1 kg   SpO2 99%   BMI 31.07 kg/m   Physical Exam Vitals and nursing note reviewed.  Constitutional:      General: She is not in acute distress.    Appearance: Normal appearance. She is well-developed.  HENT:     Head: Normocephalic and atraumatic.  Eyes:     Extraocular Movements: Extraocular movements intact.     Conjunctiva/sclera: Conjunctivae normal.     Pupils: Pupils are equal, round, and reactive to light.  Cardiovascular:     Rate and Rhythm: Normal rate and regular rhythm.     Heart sounds: No murmur heard.   Pulmonary:     Effort: Pulmonary effort is normal. No respiratory distress.     Breath sounds: Normal breath sounds.  Abdominal:     Palpations: Abdomen is soft.     Tenderness: There is no abdominal tenderness.  Musculoskeletal:        General: No swelling. Normal range of motion.     Cervical back: Normal range of motion and neck supple.  Skin:    General: Skin is warm and dry.     Capillary Refill: Capillary refill takes less than 2 seconds.  Neurological:     General: No focal deficit present.     Mental Status: She is alert and oriented to person, place, and time.     Cranial Nerves: No cranial nerve deficit.     Sensory: No sensory deficit.     Motor: No weakness.     ED Results / Procedures / Treatments   Labs (all labs ordered are listed, but only abnormal results are displayed) Labs Reviewed  BASIC METABOLIC PANEL - Abnormal; Notable for the following components:      Result Value   Potassium 3.2 (*)    Glucose, Bld 135 (*)    Creatinine, Ser 1.08 (*)  GFR calc non Af Amer 49 (*)    GFR calc Af Amer 57 (*)    All other components within normal limits  CBC - Abnormal;  Notable for the following components:   RBC 3.75 (*)    Hemoglobin 11.5 (*)    All other components within normal limits  TROPONIN I (HIGH SENSITIVITY)    EKG EKG Interpretation  Date/Time:  Sunday April 01 2020 16:59:02 EDT Ventricular Rate:  92 PR Interval:    QRS Duration: 80 QT Interval:  363 QTC Calculation: 449 R Axis:   -11 Text Interpretation: Sinus rhythm Posterior infarct, old Borderline T abnormalities, inferior leads No significant change since last tracing Confirmed by Fredia Sorrow [phone]) on 04/01/2020 5:34:57 PM   Radiology DG Chest 2 View  Result Date: 04/01/2020 CLINICAL DATA:  Right-sided nonradiating chest pain for 3 days, diabetes EXAM: CHEST - 2 VIEW COMPARISON:  04/16/2019 FINDINGS: The heart size and mediastinal contours are within normal limits. Both lungs are clear. The visualized skeletal structures are unremarkable. IMPRESSION: No active cardiopulmonary disease. Electronically Signed   By: Randa Ngo M.D.   On: 04/01/2020 17:50    Procedures Procedures (including critical care time)  Medications Ordered in ED Medications  sodium chloride flush (NS) 0.9 % injection 3 mL (3 mLs Intravenous Not Given 04/01/20 1746)    ED Course  I have reviewed the triage vital signs and the nursing notes.  Pertinent labs & imaging results that were available during my care of the patient were reviewed by me and considered in my medical decision making (see chart for details).    MDM Rules/Calculators/A&P HEAR Score: 4                       Patient heart score is 4.  Pain is been present for 3 days or more constantly initial troponin of 3 very reassuring.  Chest x-ray without acute findings.  EKG without any significant changes from old.  Did have some nonspecific T wave changes.  I feel a delta troponin not necessary.  Pain has not been worse today.  Is worse with laying on her right side.  Suggested it may be chest wall component to it.  Not made worse by  moving her right arm.  We will have patient follow-up with cardiology.  Have her return for any new or worse symptoms.  Have her start a baby aspirin a day.  Final Clinical Impression(s) / ED Diagnoses Final diagnoses:  Precordial pain    Rx / DC Orders ED Discharge Orders    None       Fredia Sorrow, MD 04/01/20 [phone]

## 2020-04-01 NOTE — ED Triage Notes (Signed)
Patient c/o right, non-radiating chest pain. Denies any nausea, vomiting, dizziness, or weakness. Denies any cough or fever. No cardiac hx. Per patient woke with pain x3 days ago and has not had any improvement. Patient states worse with movement and laying.

## 2020-04-01 NOTE — ED Notes (Signed)
ED Provider at bedside. 

## 2020-04-09 ENCOUNTER — Other Ambulatory Visit: Payer: Self-pay

## 2020-04-09 MED ORDER — METFORMIN HCL ER 500 MG PO TB24: 500.0000 mg | ORAL_TABLET | Freq: Two times a day (BID) | ORAL | 0 refills | Status: DC

## 2020-04-09 MED ORDER — BENAZEPRIL HCL 5 MG PO TABS: 5.0000 mg | ORAL_TABLET | Freq: Every day | ORAL | 0 refills | Status: DC

## 2020-04-09 MED ORDER — PRAVASTATIN SODIUM 10 MG PO TABS: 10.0000 mg | ORAL_TABLET | Freq: Every day | ORAL | 0 refills | Status: DC

## 2020-04-16 ENCOUNTER — Other Ambulatory Visit: Payer: Self-pay

## 2020-05-07 ENCOUNTER — Other Ambulatory Visit: Payer: Self-pay

## 2020-05-07 ENCOUNTER — Inpatient Hospital Stay (HOSPITAL_COMMUNITY): Payer: Medicare HMO | Attending: Hematology

## 2020-05-07 DIAGNOSIS — Z79899 Other long term (current) drug therapy: Secondary | ICD-10-CM | POA: Insufficient documentation

## 2020-05-07 DIAGNOSIS — Z90710 Acquired absence of both cervix and uterus: Secondary | ICD-10-CM | POA: Diagnosis not present

## 2020-05-07 DIAGNOSIS — I10 Essential (primary) hypertension: Secondary | ICD-10-CM | POA: Insufficient documentation

## 2020-05-07 DIAGNOSIS — D241 Benign neoplasm of right breast: Secondary | ICD-10-CM | POA: Diagnosis not present

## 2020-05-07 DIAGNOSIS — E559 Vitamin D deficiency, unspecified: Secondary | ICD-10-CM | POA: Insufficient documentation

## 2020-05-07 DIAGNOSIS — R61 Generalized hyperhidrosis: Secondary | ICD-10-CM | POA: Diagnosis not present

## 2020-05-07 DIAGNOSIS — D649 Anemia, unspecified: Secondary | ICD-10-CM | POA: Diagnosis not present

## 2020-05-07 DIAGNOSIS — E538 Deficiency of other specified B group vitamins: Secondary | ICD-10-CM | POA: Insufficient documentation

## 2020-05-07 DIAGNOSIS — E119 Type 2 diabetes mellitus without complications: Secondary | ICD-10-CM | POA: Diagnosis not present

## 2020-05-07 LAB — IRON AND TIBC
Iron: 119 ug/dL (ref 28–170)
Saturation Ratios: 44 % — ABNORMAL HIGH (ref 10.4–31.8)
TIBC: 269 ug/dL (ref 250–450)
UIBC: 150 ug/dL

## 2020-05-07 LAB — CBC WITH DIFFERENTIAL/PLATELET
Abs Immature Granulocytes: 0.03 10*3/uL (ref 0.00–0.07)
Basophils Absolute: 0 10*3/uL (ref 0.0–0.1)
Basophils Relative: 0 %
Eosinophils Absolute: 0 10*3/uL (ref 0.0–0.5)
Eosinophils Relative: 0 %
HCT: 37.7 % (ref 36.0–46.0)
Hemoglobin: 11.6 g/dL — ABNORMAL LOW (ref 12.0–15.0)
Lymphocytes Relative: 39 %
Lymphs Abs: 2 10*3/uL (ref 0.7–4.0)
MCH: 30.7 pg (ref 26.0–34.0)
MCHC: 30.8 g/dL (ref 30.0–36.0)
MCV: 99.7 fL (ref 80.0–100.0)
Monocytes Absolute: 0.5 10*3/uL (ref 0.1–1.0)
Monocytes Relative: 9 %
Neutro Abs: 2.7 10*3/uL (ref 1.7–7.7)
Neutrophils Relative %: 52 %
Platelets: 142 10*3/uL — ABNORMAL LOW (ref 150–400)
RBC: 3.78 MIL/uL — ABNORMAL LOW (ref 3.87–5.11)
RDW: 13.7 % (ref 11.5–15.5)
WBC: 5.2 10*3/uL (ref 4.0–10.5)
nRBC: 0 % (ref 0.0–0.2)

## 2020-05-07 LAB — COMPREHENSIVE METABOLIC PANEL
ALT: 12 U/L (ref 0–44)
AST: 17 U/L (ref 15–41)
Albumin: 3.6 g/dL (ref 3.5–5.0)
Alkaline Phosphatase: 42 U/L (ref 38–126)
Anion gap: 8 (ref 5–15)
BUN: 13 mg/dL (ref 8–23)
CO2: 24 mmol/L (ref 22–32)
Calcium: 8.8 mg/dL — ABNORMAL LOW (ref 8.9–10.3)
Chloride: 110 mmol/L (ref 98–111)
Creatinine, Ser: 1.23 mg/dL — ABNORMAL HIGH (ref 0.44–1.00)
GFR calc Af Amer: 49 mL/min — ABNORMAL LOW (ref >60)
GFR calc non Af Amer: 42 mL/min — ABNORMAL LOW (ref >60)
Glucose, Bld: 116 mg/dL — ABNORMAL HIGH (ref 70–99)
Potassium: 3.8 mmol/L (ref 3.5–5.1)
Sodium: 142 mmol/L (ref 135–145)
Total Bilirubin: 0.4 mg/dL (ref 0.3–1.2)
Total Protein: 6.6 g/dL (ref 6.5–8.1)

## 2020-05-07 LAB — LACTATE DEHYDROGENASE: LDH: 128 U/L (ref 98–192)

## 2020-05-07 LAB — VITAMIN D 25 HYDROXY (VIT D DEFICIENCY, FRACTURES): Vit D, 25-Hydroxy: 70.21 ng/mL (ref 30–100)

## 2020-05-07 LAB — VITAMIN B12: Vitamin B-12: 828 pg/mL (ref 180–914)

## 2020-05-07 LAB — FERRITIN: Ferritin: 90 ng/mL (ref 11–307)

## 2020-05-08 ENCOUNTER — Inpatient Hospital Stay (HOSPITAL_COMMUNITY): Payer: Medicare HMO | Admitting: Nurse Practitioner

## 2020-05-08 ENCOUNTER — Inpatient Hospital Stay (HOSPITAL_BASED_OUTPATIENT_CLINIC_OR_DEPARTMENT_OTHER): Payer: Medicare HMO | Admitting: Nurse Practitioner

## 2020-05-08 ENCOUNTER — Other Ambulatory Visit: Payer: Self-pay

## 2020-05-08 VITALS — BP 128/66 | HR 80 | Temp 97.9°F | Resp 18 | Wt 183.4 lb

## 2020-05-08 DIAGNOSIS — E559 Vitamin D deficiency, unspecified: Secondary | ICD-10-CM | POA: Diagnosis not present

## 2020-05-08 DIAGNOSIS — I10 Essential (primary) hypertension: Secondary | ICD-10-CM | POA: Diagnosis not present

## 2020-05-08 DIAGNOSIS — E119 Type 2 diabetes mellitus without complications: Secondary | ICD-10-CM | POA: Diagnosis not present

## 2020-05-08 DIAGNOSIS — D241 Benign neoplasm of right breast: Secondary | ICD-10-CM

## 2020-05-08 DIAGNOSIS — Z1231 Encounter for screening mammogram for malignant neoplasm of breast: Secondary | ICD-10-CM

## 2020-05-08 DIAGNOSIS — E538 Deficiency of other specified B group vitamins: Secondary | ICD-10-CM | POA: Diagnosis not present

## 2020-05-08 DIAGNOSIS — Z79899 Other long term (current) drug therapy: Secondary | ICD-10-CM | POA: Diagnosis not present

## 2020-05-08 DIAGNOSIS — R61 Generalized hyperhidrosis: Secondary | ICD-10-CM | POA: Diagnosis not present

## 2020-05-08 DIAGNOSIS — Z90710 Acquired absence of both cervix and uterus: Secondary | ICD-10-CM | POA: Diagnosis not present

## 2020-05-08 DIAGNOSIS — D649 Anemia, unspecified: Secondary | ICD-10-CM | POA: Diagnosis not present

## 2020-05-08 NOTE — Assessment & Plan Note (Addendum)
1.  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 - Patient started tamoxifen early 2016.  She is tolerating it very well.   -She denies any vaginal bleeding or spotting.  Her mobility is limited from the sequelae of viral meningitis in August. - Her last mammogram was 02/03/2020 which was B RADS category 1 negative.  We reviewed the results today. - Physical examination today shows right breast lower inner quadrant scar with no palpable masses, no palpable adenopathy. -She will be stopping tamoxifen on 20 2021 when she finishes her bottle. -Labs done on 05/07/2020 showed hemoglobin 11.6, WBC 5.2, platelets 142 - She will follow-up in 8 months with repeat labs and mammogram.  2.  Normocytic anemia: - Labs on 05/07/2020 showed her hemoglobin at 11.6, ferritin 90, percent saturation 44.   -She continues to take oral iron daily. - We will continue to monitor.  3.  Vitamin B-12 deficiency: -Labs on 01/31/2019 showed her B12 level at 170. -She will start on oral B12 tablets daily -Repeat labs on 08/02/2019 showed her vitamin B12 level 2330. -She will start taking her B12 every other day. -Repeat labs on 05/07/2020 showed her vitamin B12 level is 828 -She will continue taking B12 every other day. -We will recheck her labs on her next visit.  4.  Night sweats: -Patient reports the tamoxifen has never bothered her as far as hot flashes or sweats.  Over the past month she is starting to have have drenching sweats at night.  She reports she is more fatigued. -We did a work-up which showed SPEP normal.  Flow cytometry normal.  Light chains normal.  BCR ABL pending -Patient reports her night sweats have improved.  5.  Increased creatinine: -Baseline creatinine is 0.86-1.05 -Labs on 08/02/2019 showed her creatinine 1.62. -Patient reports she has not been drinking as much fluids.  She will increase her fluid  intake -Repeat labs on 05/07/2020 showed creatinine 1.23 -We will recheck her next visit.  6.  Vitamin D deficiency: -Labs on 08/02/2019 showed vitamin D level 19.88 -I prescribed vit D 50,000 units weekly. -Repeat labs on 05/07/2020 showed vitamin D 70.21

## 2020-05-08 NOTE — Progress Notes (Signed)
Jillian [name], [place] [address]   CLINIC:  Medical Oncology/Hematology  PCP:  Fayrene Helper, MD [address] [place] [address] [phone]   REASON FOR VISIT: Follow-up for breast cancer   CURRENT THERAPY: Stopping tamoxifen in 1 month.    INTERVAL HISTORY:  Jillian [name] 79 y.o. female returns for routine follow-up for breast cancer.  Patient reports she is doing well since her last visit.  She denies any new lumps or bumps present.  She denies any new bone pain. Denies any nausea, vomiting, or diarrhea. Denies any new pains. Had not noticed any recent bleeding such as epistaxis, hematuria or hematochezia. Denies recent chest pain on exertion, shortness of breath on minimal exertion, pre-syncopal episodes, or palpitations. Denies any numbness or tingling in hands or feet. Denies any recent fevers, infections, or recent hospitalizations. Patient reports appetite at 50% and energy level at 75%.  She is eating well maintaining her weight at this time.    REVIEW OF SYSTEMS:  Review of Systems  Psychiatric/Behavioral: Positive for sleep disturbance.  All other systems reviewed and are negative.    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 Oneida Alar :  internal hemorrhoids, mild left-sided diverticulosis   COLONOSCOPY N/A 09/21/2019   Procedure: COLONOSCOPY;  Surgeon: Danie Binder, MD;  Location: [facility];  Service: Endoscopy;  Laterality: N/A;  12:00pm   CYST EXCISION Right    arm   ESOPHAGOGASTRODUODENOSCOPY  2013   Dr. Oneida Alar: gastritis likely NSAID-induced. Duodenal polyp. Duodenitis and gastritis in setting of aspirin and Ibuprofen.    EYE SURGERY Right 06/07/2013   cataract   EYE SURGERY Left 09/14/2013   cataract   KNEE ARTHROSCOPY Right 02/26/2016   Procedure: ARTHROSCOPY RIGHT KNEE WITH MENSICAL DEBRIDEMENT;  Surgeon: Gaynelle Arabian, MD;  Location: [facility];  Service: Orthopedics;  Laterality: Right;   left breast biopsy     LESION REMOVAL Right 03/30/2013   Procedure: EXCISION OF NEOPLASM ARM;  Surgeon: Jamesetta So, MD;  Location: [facility];  Service: General;  Laterality: Right;   TUBAL LIGATION       SOCIAL HISTORY:  Social History   Socioeconomic History   Marital status: Widowed    Spouse name: Not on file   Number of children: Not on file   Years of education: Not on file   Highest education level: Not on file  Occupational History   Not on file  Tobacco Use   Smoking status:  Never Smoker   Smokeless tobacco: Never Used  Vaping Use   Vaping Use: Never used  Substance and Sexual Activity   Alcohol use: No   Drug use: No   Sexual activity: Not Currently    Birth control/protection: Surgical  Other Topics Concern   Not on file  Social History Narrative   Not on file   Social Determinants of Health   Financial Resource Strain: Low Risk    Difficulty of Paying Living Expenses: Not hard at all  Food Insecurity: No Food Insecurity   Worried About Charity fundraiser in the Last Year: Never true    [place] in the Last Year: Never true  Transportation Needs: No Transportation Needs   Lack of Transportation (Medical): No   Lack of Transportation (Non-Medical): No  Physical Activity: Inactive   Days of Exercise per Week: 0 days   Minutes of Exercise per Session: 0 min  Stress: No Stress Concern Present   Feeling of Stress : Only a little  Social Connections: Moderately Integrated   Frequency of Communication with Friends and Family: More than three times a week   Frequency of Social Gatherings with Friends and Family: More than three times a week   Attends Religious Services: 1 to 4 times per year   Active Member of [employer] or Organizations: Yes   Attends Archivist Meetings: More than 4 times per year   Marital Status: Widowed  Human resources officer Violence:    Fear of Current or Ex-Partner:    Emotionally Abused:    Physically Abused:    Sexually Abused:     FAMILY HISTORY:  Family History  Problem Relation Age of Onset   Hypertension Father    Congestive Heart Failure Father    Liver cancer Mother    Hypertension Sister    Hypertension Brother    Diabetes Brother    Colon cancer Neg Hx    Colon polyps Neg Hx     CURRENT MEDICATIONS:  Outpatient Encounter Medications as of 05/08/2020  Medication Sig   acetaminophen (TYLENOL) 500 MG tablet Take 1,000 mg by mouth every 6 (six) hours as needed for mild pain.   aspirin EC 81 MG tablet Take 1 tablet (81 mg total) by mouth every evening. (Patient taking differently: Take 81 mg by mouth at bedtime. )   benazepril (LOTENSIN) 5 MG tablet Take 1 tablet (5 mg total) by mouth daily.   cloNIDine (CATAPRES) 0.2 MG tablet Take one tablet once daily at bedtime for blood pressure (Patient taking differently: Take 0.2 mg by mouth at bedtime. )   cyclobenzaprine (FLEXERIL) 10 MG tablet TAKE 1 TABLET BY MOUTH AT BEDTIME AS NEEDED FOR BACK SPASM (Patient taking differently: Take 10 mg by mouth at  bedtime as needed for muscle spasms. )   ergocalciferol (VITAMIN D2) 1.25 MG (50000 UT) capsule Take 1 capsule (50,000 Units total) by mouth once a week. (Patient taking differently: Take 50,000 Units by mouth once a week. Saturday)   ferrous sulfate 325 (65 FE) MG EC tablet TAKE 1 TABLET EVERY DAY   glucose blood test strip Use as instructed to check blood glucose once daily   HYDROcodone-acetaminophen (NORCO/VICODIN) 5-325 MG tablet One tablet every four hours as needed for acute pain.  Limit of five days per [place] statue.   metFORMIN (GLUCOPHAGE-XR) 500 MG 24 hr tablet Take 1 tablet (500 mg total) by mouth 2 (two) times daily.   pravastatin (PRAVACHOL) 10 MG  tablet Take 1 tablet (10 mg total) by mouth daily.   tamoxifen (NOLVADEX) 20 MG tablet Take 1 tablet (20 mg total) by mouth daily.   vitamin B-12 (CYANOCOBALAMIN) 1000 MCG tablet Take 1,000 mcg by mouth every Monday, Wednesday, and Friday.   No facility-administered encounter medications on file as of 05/08/2020.    ALLERGIES:  Allergies  Allergen Reactions   Codeine Nausea And Vomiting   Propoxyphene N-Acetaminophen Other (See Comments)    hallucinations     PHYSICAL EXAM:  ECOG Performance status: 1  Vitals:   05/08/20 0922  BP: 128/66  Pulse: 80  Resp: 18  Temp: 97.9 F (36.6 C)  SpO2: 100%   Filed Weights   05/08/20 0914 05/08/20 0922  Weight: 183 lb 6.8 oz (83.2 kg) 183 lb 6.8 oz (83.2 kg)   Physical Exam Constitutional:      Appearance: Normal appearance. She is normal weight.  Cardiovascular:     Rate and Rhythm: Normal rate and regular rhythm.     Heart sounds: Normal heart sounds.  Pulmonary:     Effort: Pulmonary effort is normal.     Breath sounds: Normal breath sounds.  Abdominal:     General: Bowel sounds are normal.     Palpations: Abdomen is soft.  Musculoskeletal:        General: Normal range of motion.  Skin:    General: Skin is warm.  Neurological:     Mental Status: She is  alert and oriented to person, place, and time. Mental status is at baseline.  Psychiatric:        Mood and Affect: Mood normal.        Behavior: Behavior normal.        Thought Content: Thought content normal.        Judgment: Judgment normal.      LABORATORY DATA:  I have reviewed the labs as listed.  CBC    Component Value Date/Time   WBC 5.2 05/07/2020 1015   RBC 3.78 (L) 05/07/2020 1015   HGB 11.6 (L) 05/07/2020 1015   HCT 37.7 05/07/2020 1015   PLT 142 (L) 05/07/2020 1015   MCV 99.7 05/07/2020 1015   MCH 30.7 05/07/2020 1015   MCHC 30.8 05/07/2020 1015   RDW 13.7 05/07/2020 1015   LYMPHSABS 2.0 05/07/2020 1015   MONOABS 0.5 05/07/2020 1015   EOSABS 0.0 05/07/2020 1015   BASOSABS 0.0 05/07/2020 1015   CMP Latest Ref Rng & Units 05/07/2020 04/01/2020 01/30/2020  Glucose 70 - 99 mg/dL 116(H) 135(H) 106(H)  BUN 8 - 23 mg/dL 13 12 13   Creatinine 0.44 - 1.00 mg/dL 1.23(H) 1.08(H) 0.99(H)  Sodium 135 - 145 mmol/L 142 140 145  Potassium 3.5 - 5.1 mmol/L 3.8 3.2(L) 4.9  Chloride 98 - 111 mmol/L 110 106 109  CO2 22 - 32 mmol/L 24 25 26   Calcium 8.9 - 10.3 mg/dL 8.8(L) 8.9 9.3  Total Protein 6.5 - 8.1 g/dL 6.6 - 6.7  Total Bilirubin 0.3 - 1.2 mg/dL 0.4 - 0.3  Alkaline Phos 38 - 126 U/L 42 - -  AST 15 - 41 U/L 17 - 15  ALT 0 - 44 U/L 12 - 10    DIAGNOSTIC IMAGING:  I have independently reviewed the mammogram scans and discussed with the patient.  ASSESSMENT & PLAN:  Papilloma of right breast 1.  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 - Patient started tamoxifen early  2016.  She is tolerating it very well.   -She denies any vaginal bleeding or spotting.  Her mobility is limited from the sequelae of viral meningitis in August. - Her last mammogram was 02/03/2020 which was B RADS category 1 negative.  We reviewed the results today. - Physical examination today shows right breast lower  inner quadrant scar with no palpable masses, no palpable adenopathy. -She will be stopping tamoxifen on 20 2021 when she finishes her bottle. -Labs done on 05/07/2020 showed hemoglobin 11.6, WBC 5.2, platelets 142 - She will follow-up in 8 months with repeat labs and mammogram.  2.  Normocytic anemia: - Labs on 05/07/2020 showed her hemoglobin at 11.6, ferritin 90, percent saturation 44.   -She continues to take oral iron daily. - We will continue to monitor.  3.  Vitamin B-12 deficiency: -Labs on 01/31/2019 showed her B12 level at 170. -She will start on oral B12 tablets daily -Repeat labs on 08/02/2019 showed her vitamin B12 level 2330. -She will start taking her B12 every other day. -Repeat labs on 05/07/2020 showed her vitamin B12 level is 828 -She will continue taking B12 every other day. -We will recheck her labs on her next visit.  4.  Night sweats: -Patient reports the tamoxifen has never bothered her as far as hot flashes or sweats.  Over the past month she is starting to have have drenching sweats at night.  She reports she is more fatigued. -We did a work-up which showed SPEP normal.  Flow cytometry normal.  Light chains normal.  BCR ABL pending -Patient reports her night sweats have improved.  5.  Increased creatinine: -Baseline creatinine is 0.86-1.05 -Labs on 08/02/2019 showed her creatinine 1.62. -Patient reports she has not been drinking as much fluids.  She will increase her fluid intake -Repeat labs on 05/07/2020 showed creatinine 1.23 -We will recheck her next visit.  6.  Vitamin D deficiency: -Labs on 08/02/2019 showed vitamin D level 19.88 -I prescribed vit D 50,000 units weekly. -Repeat labs on 05/07/2020 showed vitamin D 70.21     Orders placed this encounter:  Orders Placed This Encounter  Procedures   MM 3D SCREEN BREAST BILATERAL   CBC with Differential/Platelet   Comprehensive metabolic panel   Ferritin   Iron and TIBC   Vitamin B12   VITAMIN  D 25 Hydroxy (Vit-D Deficiency, Fractures)   Folate      Francene Finders, FNP-C [place] [phone]

## 2020-05-31 ENCOUNTER — Other Ambulatory Visit: Payer: Self-pay | Admitting: Family Medicine

## 2020-06-04 ENCOUNTER — Other Ambulatory Visit: Payer: Self-pay | Admitting: Family Medicine

## 2020-06-13 ENCOUNTER — Ambulatory Visit: Payer: Medicare HMO | Admitting: Cardiology

## 2020-06-18 ENCOUNTER — Telehealth: Payer: Self-pay

## 2020-06-18 NOTE — Telephone Encounter (Signed)
Pt needs to clarify appt

## 2020-06-18 NOTE — Telephone Encounter (Signed)
Called pt back and lvm regarding appt time and date

## 2020-06-20 ENCOUNTER — Other Ambulatory Visit: Payer: Self-pay

## 2020-06-20 ENCOUNTER — Encounter: Payer: Self-pay | Admitting: Family Medicine

## 2020-06-20 ENCOUNTER — Ambulatory Visit (INDEPENDENT_AMBULATORY_CARE_PROVIDER_SITE_OTHER): Payer: Medicare HMO | Admitting: Family Medicine

## 2020-06-20 VITALS — BP 137/74 | HR 83 | Ht 64.0 in | Wt 182.0 lb

## 2020-06-20 DIAGNOSIS — Z1159 Encounter for screening for other viral diseases: Secondary | ICD-10-CM | POA: Diagnosis not present

## 2020-06-20 DIAGNOSIS — E785 Hyperlipidemia, unspecified: Secondary | ICD-10-CM

## 2020-06-20 DIAGNOSIS — Z23 Encounter for immunization: Secondary | ICD-10-CM | POA: Diagnosis not present

## 2020-06-20 DIAGNOSIS — E1159 Type 2 diabetes mellitus with other circulatory complications: Secondary | ICD-10-CM

## 2020-06-20 DIAGNOSIS — Z0000 Encounter for general adult medical examination without abnormal findings: Secondary | ICD-10-CM

## 2020-06-20 LAB — HM DIABETES EYE EXAM

## 2020-06-20 NOTE — Patient Instructions (Addendum)
F/u with MD in 5 months , call if you need me before  Flu vaccine today  Please schedule your eye exam, I have entered the referral   Excellent exam.  Fasting lipid, cmHappy 78 next week Friday, and hope you have MANY, MANY more!  Fasting lipid cmp  and EGFr and hBA1C, hepatitis C screen  first week in November.  No changes in medication  It is important that you exercise regularly at least 30 minutes 5 times a week. If you develop chest pain, have severe difficulty breathing, or feel very tired, stop exercising immediately and seek medical attention  Think about what you will eat, plan ahead. Choose " clean, green, fresh or frozen" over canned, processed or packaged foods which are more sugary, salty and fatty. 70 to 75% of food eaten should be vegetables and fruit. Three meals at set times with snacks allowed between meals, but they must be fruit or vegetables. Aim to eat over a 12 hour period , example 7 am to 7 pm, and STOP after  your last meal of the day. Drink water,generally about 64 ounces per day, no other drink is as healthy. Fruit juice is best enjoyed in a healthy way, by EATING the fruit. Thanks for choosing [facility], we consider it a privelige to serve you.

## 2020-06-20 NOTE — Assessment & Plan Note (Signed)

## 2020-06-20 NOTE — Progress Notes (Signed)
Jillian [name]     MRN: [id number]      DOB: Oct 09, 1940  HPI: Patient is in for annual physical exam. No other health concerns are expressed or addressed at the visit. Recent labs, if available are reviewed. Immunization is reviewed , and  updated if needed.   PE: BP 137/74   Pulse 83   Ht 5\' 4"  (1.626 m)   Wt 182 lb (82.6 kg)   BMI 31.24 kg/m   Pleasant  female, alert and oriented x 3, in no cardio-pulmonary distress. Afebrile. HEENT No facial trauma or asymetry. Sinuses non tender.  Extra occullar muscles intact.. External ears normal, . Neck: supple, no adenopathy,JVD or thyromegaly.No bruits.  Chest: Clear to ascultation bilaterally.No crackles or wheezes. Non tender to palpation  Breast: asymetry,no masses or lumps. No tenderness. No nipple discharge right nipple  Inversion, unchanged No axillary or supraclavicular adenopathy  Cardiovascular system; Heart sounds normal,  S1 and  S2 ,no S3.  No murmur, or thrill. Apical beat not displaced Peripheral pulses normal.  Abdomen: Soft, non tender, no organomegaly or masses. No bruits. Bowel sounds normal. No guarding, tenderness or rebound.     Musculoskeletal exam: Full ROM of spine, hips , shoulders and knees. No deformity ,swelling or crepitus noted. No muscle wasting or atrophy.   Neurologic: Cranial nerves 2 to 12 intact. Power, tone ,sensation and reflexes normal throughout.  disturbance in gait. No tremor.  Skin: Intact, no ulceration, erythema , scaling or rash noted. Pigmentation normal throughout  Psych; Normal mood and affect. Judgement and concentration normal   Assessment & Plan:  Annual physical exam Annual exam as documented. Counseling done  re healthy lifestyle involving commitment to 150 minutes exercise per week, heart healthy diet, and attaining healthy weight.The importance of adequate sleep also discussed. Regular seat belt use and home safety, is also discussed. Changes  in health habits are decided on by the patient with goals and time frames  set for achieving them. Immunization and cancer screening needs are specifically addressed at this visit.   Type 2 diabetes mellitus with vascular disease (Jillian [name]) Jillian [name] is reminded of the importance of commitment to daily physical activity for 30 minutes or more, as able and the need to limit carbohydrate intake to 30 to 60 grams per meal to help with blood sugar control.   The need to take medication as prescribed, test blood sugar as directed, and to call between visits if there is a concern that blood sugar is uncontrolled is also discussed.   Jillian [name] is reminded of the importance of daily foot exam, annual eye examination, and good blood sugar, blood pressure and cholesterol control.  Diabetic Labs Latest Ref Rng & Units 05/07/2020 04/01/2020 01/30/2020 11/02/2019 08/02/2019  HbA1c <5.7 % of total Hgb - - 6.5(H) - -  Microalbumin mg/dL - - 1.3 - -  Micro/Creat Ratio <30 mcg/mg creat - - 4 - -  Chol <200 mg/dL - - 114 - -  HDL > OR = 50 mg/dL - - 48(L) - -  Calc LDL mg/dL (calc) - - 48 - -  Triglycerides <150 mg/dL - - 99 - -  Creatinine 0.44 - 1.00 mg/dL 1.23(H) 1.08(H) 0.99(H) 1.13(H) 1.62(H)   BP/Weight 06/20/2020 05/08/2020 04/01/2020 03/15/2020 03/03/2020 02/09/2020 03/28/2375  Systolic BP 283 151 761 607 371 - 062  Diastolic BP 74 66 74 83 90 - 68  Wt. (Lbs) 182 183.42 181 183 - 183 184  BMI 31.24 31.48 31.07  31.41 - 31.41 31.58   Foot/eye exam completion dates Latest Ref Rng & Units 06/20/2020 07/20/2019  Eye Exam No Retinopathy - No Retinopathy  Foot exam Order - - -  Foot Form Completion - Done -    Controlled, no change in medication Updated lab needed at/ before next visit.

## 2020-06-20 NOTE — Assessment & Plan Note (Signed)
Jillian [name] is reminded of the importance of commitment to daily physical activity for 30 minutes or more, as able and the need to limit carbohydrate intake to 30 to 60 grams per meal to help with blood sugar control.   The need to take medication as prescribed, test blood sugar as directed, and to call between visits if there is a concern that blood sugar is uncontrolled is also discussed.   Jillian [name] is reminded of the importance of daily foot exam, annual eye examination, and good blood sugar, blood pressure and cholesterol control.  Diabetic Labs Latest Ref Rng & Units 05/07/2020 04/01/2020 01/30/2020 11/02/2019 08/02/2019  HbA1c <5.7 % of total Hgb - - 6.5(H) - -  Microalbumin mg/dL - - 1.3 - -  Micro/Creat Ratio <30 mcg/mg creat - - 4 - -  Chol <200 mg/dL - - 114 - -  HDL > OR = 50 mg/dL - - 48(L) - -  Calc LDL mg/dL (calc) - - 48 - -  Triglycerides <150 mg/dL - - 99 - -  Creatinine 0.44 - 1.00 mg/dL 1.23(H) 1.08(H) 0.99(H) 1.13(H) 1.62(H)   BP/Weight 06/20/2020 05/08/2020 04/01/2020 03/15/2020 03/03/2020 02/09/2020 7/65/4650  Systolic BP 354 656 812 751 700 - 174  Diastolic BP 74 66 74 83 90 - 68  Wt. (Lbs) 182 183.42 181 183 - 183 184  BMI 31.24 31.48 31.07 31.41 - 31.41 31.58   Foot/eye exam completion dates Latest Ref Rng & Units 06/20/2020 07/20/2019  Eye Exam No Retinopathy - No Retinopathy  Foot exam Order - - -  Foot Form Completion - Done -    Controlled, no change in medication Updated lab needed at/ before next visit.

## 2020-06-22 ENCOUNTER — Encounter: Payer: Self-pay | Admitting: Family Medicine

## 2020-07-03 ENCOUNTER — Other Ambulatory Visit: Payer: Self-pay | Admitting: Family Medicine

## 2020-07-03 DIAGNOSIS — E1159 Type 2 diabetes mellitus with other circulatory complications: Secondary | ICD-10-CM | POA: Diagnosis not present

## 2020-07-04 LAB — LIPID PANEL
Cholesterol: 141 mg/dL (ref <200)
HDL: 49 mg/dL — ABNORMAL LOW (ref >=50)
LDL Cholesterol (Calc): 72 mg/dL (calc)
Non-HDL Cholesterol (Calc): 92 mg/dL (calc) (ref <130)
Total CHOL/HDL Ratio: 2.9 (calc) (ref <5.0)
Triglycerides: 118 mg/dL (ref <150)

## 2020-07-09 ENCOUNTER — Other Ambulatory Visit: Payer: Self-pay

## 2020-07-09 ENCOUNTER — Encounter: Payer: Self-pay | Admitting: Family Medicine

## 2020-07-09 ENCOUNTER — Other Ambulatory Visit: Payer: Self-pay | Admitting: Family Medicine

## 2020-07-09 ENCOUNTER — Ambulatory Visit (INDEPENDENT_AMBULATORY_CARE_PROVIDER_SITE_OTHER): Payer: Medicare HMO | Admitting: Family Medicine

## 2020-07-09 VITALS — BP 124/80 | HR 99 | Resp 16 | Ht 64.0 in | Wt 183.0 lb

## 2020-07-09 DIAGNOSIS — E1159 Type 2 diabetes mellitus with other circulatory complications: Secondary | ICD-10-CM

## 2020-07-09 DIAGNOSIS — Z1159 Encounter for screening for other viral diseases: Secondary | ICD-10-CM | POA: Diagnosis not present

## 2020-07-09 DIAGNOSIS — I10 Essential (primary) hypertension: Secondary | ICD-10-CM | POA: Diagnosis not present

## 2020-07-09 DIAGNOSIS — K5792 Diverticulitis of intestine, part unspecified, without perforation or abscess without bleeding: Secondary | ICD-10-CM | POA: Diagnosis not present

## 2020-07-09 DIAGNOSIS — R1030 Lower abdominal pain, unspecified: Secondary | ICD-10-CM | POA: Diagnosis not present

## 2020-07-09 LAB — POCT GLYCOSYLATED HEMOGLOBIN (HGB A1C): Hemoglobin A1C: 6.3 % — AB (ref 4.0–5.6)

## 2020-07-09 MED ORDER — METRONIDAZOLE 500 MG PO TABS: 500.0000 mg | ORAL_TABLET | Freq: Three times a day (TID) | ORAL | 0 refills | Status: DC

## 2020-07-09 MED ORDER — CIPROFLOXACIN HCL 500 MG PO TABS: 500.0000 mg | ORAL_TABLET | Freq: Two times a day (BID) | ORAL | 0 refills | Status: DC

## 2020-07-09 NOTE — Progress Notes (Signed)
   Jillian [name]     MRN: [id number]      DOB: 1940-12-04   HPI Jillian [name] is here with a 3 day h/o lower abdominal pain , rated at a 6, intermittent up to an 8, poor appetite due to pain, scant BM, took mira lax yesterday with some effect, no blood or mucus in stool  Had left sided headache not new, resolved with alleve  ROS Denies recent fever or chills. Denies sinus pressure, nasal congestion, ear pain or sore throat. Denies chest congestion, productive cough or wheezing. Denies chest pains, palpitations and leg swelling  Denies dysuria, frequency, hesitancy or incontinence. Denies joint pain, swelling and limitation in mobility. Denies headaches, seizures, numbness, or tingling. Denies depression, anxiety Denies skin break down or rash.   PE  BP 124/80   Pulse 99   Resp 16   Ht $R'5\' 4"'RF$  (1.626 m)   Wt 183 lb (83 kg)   SpO2 96%   BMI 31.41 kg/m   Patient alert and oriented and in no cardiopulmonary distress.Patient uncomfortable and in pain  HEENT: No facial asymmetry, EOMI,     Neck supple .  Chest: Clear to auscultation bilaterally.  CVS: S1, S2 no murmurs, no S3.Regular rate.  ABD: Soft localized lower abdominal tenderness and rebound in LLQ  Ext: No edema  MS: Adequate ROM spine, shoulders, hips and knees.  Skin: Intact, no ulcerations or rash noted.  Psych: Good eye contact, normal affect. Memory intact not anxious or depressed appearing.  CNS: CN 2-12 intact, power,  normal throughout.no focal deficits noted.   Assessment & Plan  Acute diverticulitis of intestine 3 day history, gurading and rebound transverse and right colon cT abdomen, cbc and diff, cmp and eGFr, Hepatitis C Liquid diet and antibiotic  ESSENTIAL HYPERTENSION, BENIGN Controlled, no change in medication   Type 2 diabetes mellitus with vascular disease (HCC) Controlled, no change in medication

## 2020-07-09 NOTE — Patient Instructions (Addendum)
Follow-up in office in 7 to 10 days call if you need me sooner.  GlycoHB in office   You are treated today for acute diverticulitis.  Clear liquid diet for the next 2 to 3 days.  2 antibiotics are prescribed please take as directed ciprofloxacin and Flagyl.  Abdominal scan today.  Labs today CBC and differential CMP and EGFR hepatitis C screen.  If pain worsens, or you start having fever, chills, bloody stool, you need to go tot the ED      Diverticulitis  Diverticulitis is when small pockets in your large intestine (colon) get infected or swollen. This causes stomach pain and watery poop (diarrhea). These pouches are called diverticula. They form in people who have a condition called diverticulosis. Follow these instructions at home: Medicines  Take over-the-counter and prescription medicines only as told by your doctor. These include: ? Antibiotics. ? Pain medicines. ? Fiber pills. ? Probiotics. ? Stool softeners.  Do not drive or use heavy machinery while taking prescription pain medicine.  If you were prescribed an antibiotic, take it as told. Do not stop taking it even if you feel better. General instructions   Follow a diet as told by your doctor.  When you feel better, your doctor may tell you to change your diet. You may need to eat a lot of fiber. Fiber makes it easier to poop (have bowel movements). Healthy foods with fiber include: ? Berries. ? Beans. ? Lentils. ? Green vegetables.  Exercise 3 or more times a week. Aim for 30 minutes each time. Exercise enough to sweat and make your heart beat faster.  Keep all follow-up visits as told. This is important. You may need to have an exam of the large intestine. This is called a colonoscopy. Contact a doctor if:  Your pain does not get better.  You have a hard time eating or drinking.  You are not pooping like normal. Get help right away if:  Your pain gets worse.  Your problems do not get  better.  Your problems get worse very fast.  You have a fever.  You throw up (vomit) more than one time.  You have poop that is: ? Bloody. ? Black. ? Tarry. Summary  Diverticulitis is when small pockets in your large intestine (colon) get infected or swollen.  Take medicines only as told by your doctor.  Follow a diet as told by your doctor. This information is not intended to replace advice given to you by your health care provider. Make sure you discuss any questions you have with your health care provider. Document Revised: 08/21/2017 Document Reviewed: 09/25/2016 Elsevier Patient Education  [place].

## 2020-07-09 NOTE — Assessment & Plan Note (Signed)
3 day history, gurading and rebound transverse and right colon cT abdomen, cbc and diff, cmp and eGFr, Hepatitis C Liquid diet and antibiotic

## 2020-07-10 ENCOUNTER — Telehealth: Payer: Self-pay

## 2020-07-10 ENCOUNTER — Ambulatory Visit (HOSPITAL_COMMUNITY)
Admission: RE | Admit: 2020-07-10 | Discharge: 2020-07-10 | Disposition: A | Discharge status: Home / Self Care | Payer: Medicare HMO | Source: Ambulatory Visit | Attending: Family Medicine | Admitting: Family Medicine

## 2020-07-10 ENCOUNTER — Encounter: Payer: Self-pay | Admitting: Internal Medicine

## 2020-07-10 DIAGNOSIS — R1030 Lower abdominal pain, unspecified: Secondary | ICD-10-CM | POA: Insufficient documentation

## 2020-07-10 DIAGNOSIS — N281 Cyst of kidney, acquired: Secondary | ICD-10-CM | POA: Diagnosis not present

## 2020-07-10 DIAGNOSIS — Z90710 Acquired absence of both cervix and uterus: Secondary | ICD-10-CM | POA: Diagnosis not present

## 2020-07-10 DIAGNOSIS — K5732 Diverticulitis of large intestine without perforation or abscess without bleeding: Secondary | ICD-10-CM | POA: Diagnosis not present

## 2020-07-10 DIAGNOSIS — Z9049 Acquired absence of other specified parts of digestive tract: Secondary | ICD-10-CM | POA: Diagnosis not present

## 2020-07-10 LAB — CBC WITH DIFFERENTIAL/PLATELET
Absolute Monocytes: 667 cells/uL (ref 200–950)
Basophils Absolute: 19 cells/uL (ref 0–200)
Basophils Relative: 0.2 %
Eosinophils Absolute: 9 cells/uL — ABNORMAL LOW (ref 15–500)
Eosinophils Relative: 0.1 %
HCT: 36.7 % (ref 35.0–45.0)
Hemoglobin: 11.9 g/dL (ref 11.7–15.5)
Lymphs Abs: 2463 cells/uL (ref 850–3900)
MCH: 30.1 pg (ref 27.0–33.0)
MCHC: 32.4 g/dL (ref 32.0–36.0)
MCV: 92.7 fL (ref 80.0–100.0)
MPV: 12.6 fL — ABNORMAL HIGH (ref 7.5–12.5)
Monocytes Relative: 7.1 %
Neutro Abs: 6242 cells/uL (ref 1500–7800)
Neutrophils Relative %: 66.4 %
Platelets: 168 10*3/uL (ref 140–400)
RBC: 3.96 10*6/uL (ref 3.80–5.10)
RDW: 13.5 % (ref 11.0–15.0)
Total Lymphocyte: 26.2 %
WBC: 9.4 10*3/uL (ref 3.8–10.8)

## 2020-07-10 LAB — COMPLETE METABOLIC PANEL WITH GFR
AG Ratio: 1.5 (calc) (ref 1.0–2.5)
ALT: 9 U/L (ref 6–29)
AST: 15 U/L (ref 10–35)
Albumin: 3.9 g/dL (ref 3.6–5.1)
Alkaline phosphatase (APISO): 51 U/L (ref 37–153)
BUN: 11 mg/dL (ref 7–25)
CO2: 24 mmol/L (ref 20–32)
Calcium: 9.1 mg/dL (ref 8.6–10.4)
Chloride: 108 mmol/L (ref 98–110)
Creat: 0.9 mg/dL (ref 0.60–0.93)
GFR, Est African American: 71 mL/min/{1.73_m2} (ref >=60)
GFR, Est Non African American: 61 mL/min/{1.73_m2} (ref >=60)
Globulin: 2.6 g/dL (calc) (ref 1.9–3.7)
Glucose, Bld: 106 mg/dL (ref 65–139)
Potassium: 4 mmol/L (ref 3.5–5.3)
Sodium: 142 mmol/L (ref 135–146)
Total Bilirubin: 0.4 mg/dL (ref 0.2–1.2)
Total Protein: 6.5 g/dL (ref 6.1–8.1)

## 2020-07-10 LAB — HEPATITIS C ANTIBODY
Hepatitis C Ab: NONREACTIVE
SIGNAL TO CUT-OFF: 0.01 (ref <1.00)

## 2020-07-10 NOTE — Telephone Encounter (Signed)
Left detailed messages on both numbers regarding patients CT that is scheduled this afternoon at [facility]. Needs to arrive at Main Entrance by 4pm for appt. No solid foods after 12noon today 07/10/20

## 2020-07-11 ENCOUNTER — Telehealth: Payer: Self-pay

## 2020-07-11 NOTE — Telephone Encounter (Signed)
Per Kate Sable LPN patient is aware of results.

## 2020-07-11 NOTE — Telephone Encounter (Signed)
Pls see my messages sent to you earlier today!

## 2020-07-11 NOTE — Telephone Encounter (Signed)
Jillian [name] with [facility] Radiology called. CT results for patient is as follows Diverticulitis of proximal sigmoid colon is noted without abscess formation.

## 2020-07-12 ENCOUNTER — Telehealth: Payer: Self-pay

## 2020-07-12 ENCOUNTER — Ambulatory Visit (INDEPENDENT_AMBULATORY_CARE_PROVIDER_SITE_OTHER): Payer: Medicare HMO | Admitting: Family Medicine

## 2020-07-12 ENCOUNTER — Other Ambulatory Visit: Payer: Self-pay

## 2020-07-12 VITALS — BP 108/75 | HR 89 | Resp 16 | Ht 64.0 in | Wt 181.0 lb

## 2020-07-12 DIAGNOSIS — K5792 Diverticulitis of intestine, part unspecified, without perforation or abscess without bleeding: Secondary | ICD-10-CM | POA: Diagnosis not present

## 2020-07-12 DIAGNOSIS — T7840XA Allergy, unspecified, initial encounter: Secondary | ICD-10-CM | POA: Diagnosis not present

## 2020-07-12 MED ORDER — AMOXICILLIN-POT CLAVULANATE 875-125 MG PO TABS: 1.0000 | ORAL_TABLET | Freq: Two times a day (BID) | ORAL | 0 refills | Status: DC

## 2020-07-12 MED ORDER — CEFTRIAXONE SODIUM 500 MG IJ SOLR
500.0000 mg | Freq: Once | INTRAMUSCULAR | Status: AC
  Administered 2020-07-12: 500 mg via INTRAMUSCULAR

## 2020-07-12 NOTE — Assessment & Plan Note (Addendum)
CT scan positive on 07/09/2020, febrile with chills last night and itching on cipro and f;lagyll. GI eval. Rocephin 500 mg iM in office

## 2020-07-12 NOTE — Telephone Encounter (Signed)
Patients son call and stated that he thinks his mother is having an allergic reaction to the antibiotic prescribed. She has been having chills and itching. Stated patient had a little fever last night. Patient didn't take dose last night.   Spoke with son. Patient had a low grade fever last night. No contact with anyone covid positive. No sob, cough or other symptoms. Scheduled patient to come in at 9:40am to see provider.

## 2020-07-12 NOTE — Patient Instructions (Addendum)
Keep follow up 10/26 to first or second week in November, call if you need me before  Rocephin 500 mg IM in office today  You are referred urgently to gI for management of diverticulitis , please wait for appointment  Information  I spoke with the GI Doctor , recommend 1 week of Augmentin .This is prescribed , and I will re evaluate you in 1 week in the office.  Do not take the ciprofloxacin and metronidazole as you report itching

## 2020-07-12 NOTE — Assessment & Plan Note (Addendum)
Generalized itching on cipro and flagyll, d/c both, refer to [facility] for management of diverticulitis Rocephin o in office today Phone consult and augmentin prescribed Will f/u in 7 to 10 days

## 2020-07-13 ENCOUNTER — Other Ambulatory Visit: Payer: Self-pay

## 2020-07-13 ENCOUNTER — Ambulatory Visit
Admission: EM | Admit: 2020-07-13 | Discharge: 2020-07-13 | Disposition: A | Discharge status: Home / Self Care | Payer: Medicare HMO | Attending: Emergency Medicine | Admitting: Emergency Medicine

## 2020-07-13 DIAGNOSIS — T7840XA Allergy, unspecified, initial encounter: Secondary | ICD-10-CM | POA: Diagnosis not present

## 2020-07-13 MED ORDER — HYDROXYZINE HCL 25 MG PO TABS: 25.0000 mg | ORAL_TABLET | Freq: Four times a day (QID) | ORAL | 0 refills | Status: DC | PRN

## 2020-07-13 NOTE — ED Provider Notes (Signed)
[place]   [id number] 07/13/20 Arrival Time: 2683  Cc: Allergic reaction  SUBJECTIVE:  Jillian [name] is a 79 y.o. female who presented to the urgent care with a complaint of allergic reaction while taking Cipro and metronidazole for diverticulitis this past Monday.  Was seen by PCP after she developed itching from the medication.  Medication management was completed and metronidazole and Cipro was discontinued.  Augmentin was then prescribed.  Patient stated she is still having itching symptom.  Has not tried any OTC medication.    Symptoms are made worse with taking antibiotic.  Denies previous symptoms in the past.   Denies fever, chills, nausea, vomiting, erythema, redness, swollen glands, oral manifestations such as throat swelling/ tingling, mouth swelling/ tingling, tongue swelling/tingling, dyspnea, SOB, chest pain, abdominal pain, changes in bowel or bladder function.     ROS: As per HPI.  All other pertinent ROS negative.     Past Medical History:  Diagnosis Date  . Atypical ductal hyperplasia of breast   . Chronic fatigue   . Diabetes mellitus 2011  . DJD (degenerative joint disease)    of neck    . Dyslipidemia   . 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 Hypertension 2005  . Mild obesity   . Postmenopausal   . Rectal bleeding   . Viral meningitis   . Wears glasses    Past Surgical History:  Procedure Laterality Date  . ABDOMINAL HYSTERECTOMY  1992   fibroids  . APPENDECTOMY    . BREAST EXCISIONAL BIOPSY Right    benign  . BREAST LUMPECTOMY WITH RADIOACTIVE SEED LOCALIZATION Right 10/05/2014   Procedure: BREAST LUMPECTOMY WITH RADIOACTIVE SEED LOCALIZATION;  Surgeon: Erroll Luna, MD;  Location: [facility];  Service: General;  Laterality: Right;  . cataract Bilateral   . COLONOSCOPY  2013   Dr. Oneida Alar :  internal hemorrhoids, mild left-sided diverticulosis  . COLONOSCOPY N/A  09/21/2019   Procedure: COLONOSCOPY;  Surgeon: Danie Binder, MD;  Location: [facility];  Service: Endoscopy;  Laterality: N/A;  12:00pm  . CYST EXCISION Right    arm  . ESOPHAGOGASTRODUODENOSCOPY  2013   Dr. Oneida Alar: gastritis likely NSAID-induced. Duodenal polyp. Duodenitis and gastritis in setting of aspirin and Ibuprofen.   Marland Kitchen EYE SURGERY Right 06/07/2013   cataract  . EYE SURGERY Left 09/14/2013   cataract  . KNEE ARTHROSCOPY Right 02/26/2016   Procedure: ARTHROSCOPY RIGHT KNEE WITH MENSICAL DEBRIDEMENT;  Surgeon: Gaynelle Arabian, MD;  Location: [facility];  Service: Orthopedics;  Laterality: Right;  . left breast biopsy    . LESION REMOVAL Right 03/30/2013   Procedure: EXCISION OF NEOPLASM ARM;  Surgeon: Jamesetta So, MD;  Location: [facility];  Service: General;  Laterality: Right;  . TUBAL LIGATION     Allergies  Allergen Reactions  . Ciprofloxacin Itching  . Metronidazole Itching  . Codeine Nausea And Vomiting  . Propoxyphene N-Acetaminophen Other (See Comments)    hallucinations   No current facility-administered medications on file prior to encounter.   Current Outpatient Medications on File Prior to Encounter  Medication Sig Dispense Refill  . acetaminophen (TYLENOL) 500 MG tablet Take 1,000 mg by mouth every 6 (six) hours as needed for mild pain.    Marland Kitchen amoxicillin-clavulanate (AUGMENTIN) 875-125 MG tablet Take 1 tablet by mouth 2 (two) times daily. 14 tablet 0  . aspirin EC 81 MG tablet Take 1 tablet (  81 mg total) by mouth every evening. (Patient taking differently: Take 81 mg by mouth at bedtime. ) 90 tablet 1  . benazepril (LOTENSIN) 5 MG tablet TAKE 1 TABLET EVERY DAY 90 tablet 0  . cloNIDine (CATAPRES) 0.2 MG tablet TAKE 1 TABLET ONE TIME DAILY AT BEDTIME FOR BLOOD PRESSURE 90 tablet 3  . cyclobenzaprine (FLEXERIL) 10 MG tablet TAKE 1 TABLET BY MOUTH AT BEDTIME AS NEEDED FOR BACK SPASM (Patient taking differently: Take 10 mg by mouth at bedtime as needed for muscle spasms. )  20 tablet 0  . ergocalciferol (VITAMIN D2) 1.25 MG (50000 UT) capsule Take 1 capsule (50,000 Units total) by mouth once a week. (Patient taking differently: Take 50,000 Units by mouth once a week. Saturday) 16 capsule 4  . ferrous sulfate 325 (65 FE) MG EC tablet TAKE 1 TABLET EVERY DAY 90 tablet 2  . glucose blood test strip Use as instructed to check blood glucose once daily 100 each 0  . HYDROcodone-acetaminophen (NORCO/VICODIN) 5-325 MG tablet One tablet every four hours as needed for acute pain.  Limit of five days per [place] statue. 30 tablet 0  . metFORMIN (GLUCOPHAGE-XR) 500 MG 24 hr tablet TAKE 1 TABLET TWICE DAILY 180 tablet 0  . pravastatin (PRAVACHOL) 10 MG tablet TAKE 1 TABLET (10 MG TOTAL) BY MOUTH DAILY. 90 tablet 0  . tamoxifen (NOLVADEX) 20 MG tablet Take 1 tablet (20 mg total) by mouth daily. 90 tablet 4  . vitamin B-12 (CYANOCOBALAMIN) 1000 MCG tablet Take 1,000 mcg by mouth every Monday, Wednesday, and Friday.      Social History   Socioeconomic History  . Marital status: Widowed    Spouse name: Not on file  . Number of children: Not on file  . Years of education: Not on file  . Highest education level: Not on file  Occupational History  . Not on file  Tobacco Use  . Smoking status: Never Smoker  . Smokeless tobacco: Never Used  Vaping Use  . Vaping Use: Never used  Substance and Sexual Activity  . Alcohol use: No  . Drug use: No  . Sexual activity: Not Currently    Birth control/protection: Surgical  Other Topics Concern  . Not on file  Social History Narrative  . Not on file   Social Determinants of Health   Financial Resource Strain: Low Risk   . Difficulty of Paying Living Expenses: Not hard at all  Food Insecurity: No Food Insecurity  . Worried About Charity fundraiser in the Last Year: Never true  . Ran Out of Food in the Last Year: Never true  Transportation Needs: No Transportation Needs  . Lack of Transportation (Medical): No  . Lack of  Transportation (Non-Medical): No  Physical Activity: Inactive  . Days of Exercise per Week: 0 days  . Minutes of Exercise per Session: 0 min  Stress: No Stress Concern Present  . Feeling of Stress : Only a little  Social Connections: Moderately Integrated  . Frequency of Communication with Friends and Family: More than three times a week  . Frequency of Social Gatherings with Friends and Family: More than three times a week  . Attends Religious Services: 1 to 4 times per year  . Active Member of Clubs or Organizations: Yes  . Attends Archivist Meetings: More than 4 times per year  . Marital Status: Widowed  Intimate Partner Violence:   . Fear of Current or Ex-Partner: Not on file  .  Emotionally Abused: Not on file  . Physically Abused: Not on file  . Sexually Abused: Not on file   Family History  Problem Relation Age of Onset  . Hypertension Father   . Congestive Heart Failure Father   . Liver cancer Mother   . Hypertension Sister   . Hypertension Brother   . Diabetes Brother   . Colon cancer Neg Hx   . Colon polyps Neg Hx      OBJECTIVE:  Vitals:   07/13/20 1517  BP: 111/75  Pulse: (!) 101  Resp: 20  Temp: 98.1 F (36.7 C)  SpO2: 96%     General appearance: Alert, speaking in full sentences without difficulty HEENT:NCAT; no obvious facial swelling; Ears: EACs clear, TMs pearly gray; Eyes: PERRL.  EOM grossly intact. Nose: nares patent without rhinorrhea; Throat: tonsils nonerythematous or enlarged, uvula midline Neck: supple without LAD Lungs: clear to auscultation bilaterally without adventitious breath sounds; normal respiratory effort; no labored respirations Heart: regular rate and rhythm.  Radial pulses 2+ symmetrical bilaterally; cap refill < 2 seconds Abdomen: soft, nondistended, normal active bowel sounds; nontender to palpation; no guarding  Skin: warm and dry Psychological: alert and cooperative; normal mood and affect  ASSESSMENT &  PLAN:  1. Allergic reaction, initial encounter     Meds ordered this encounter  Medications  . hydrOXYzine (ATARAX/VISTARIL) 25 MG tablet    Sig: Take 1 tablet (25 mg total) by mouth every 6 (six) hours as needed for itching.    Dispense:  30 tablet    Refill:  0    No orders of the defined types were placed in this encounter.   Discharge instructions  Hydroxyzine was prescribed/take as directed Rest push fluids Follow up with PCP if symptoms are not improving Return sooner or go to the ED if you have any new or worsening symptoms such as difficulty breathing, shortness of breath, chest pain, nausea, vomiting, throat tightness or swelling, tongue swelling or tingling, worsening lip or facial swelling, abdominal pain, changes in bowel or bladder habits, no improvement despite medications, etc...   Reviewed expectations re: course of current medical issues. Questions answered. Outlined signs and symptoms indicating need for more acute intervention. Patient verbalized understanding. After Visit Summary given.          Emerson Monte, FNP 07/13/20 1540

## 2020-07-13 NOTE — Discharge Instructions (Signed)
Hydroxyzine was prescribed/take as directed Rest push fluids Follow up with PCP if symptoms are not improving Return sooner or go to the ED if you have any new or worsening symptoms such as difficulty breathing, shortness of breath, chest pain, nausea, vomiting, throat tightness or swelling, tongue swelling or tingling, worsening lip or facial swelling, abdominal pain, changes in bowel or bladder habits, no improvement despite medications, etc..Marland Kitchen

## 2020-07-13 NOTE — ED Triage Notes (Signed)
Pt was given cipro on Monday for diverticulitis and developed itching, was dc and started on amoxicillin and is still itching , no rash present , denies sob

## 2020-07-14 ENCOUNTER — Encounter: Payer: Self-pay | Admitting: Family Medicine

## 2020-07-14 NOTE — Progress Notes (Signed)
   [name]     MRN: [id number]      DOB: 27-Sep-1940   HPI [name] is here with c/o allergic reaction to cipro and/ or flagyll states she started itching all over on day 2 and stopped the medication last night and subsequently experienced chills and fever States no abdominal pain, has remained on liquid diet , no nausea or vomit Denies wheezing , difficulty swallowing or shortness of breath ROS  Denies sinus pressure, nasal congestion, ear pain or sore throat. Denies chest congestion, productive cough or wheezing. Denies chest pains, palpitations and leg swelling .   Denies dysuria, frequency, hesitancy or incontinence. Denies joint pain, swelling and limitation in mobility. Denies headaches, seizures, numbness, or tingling. Denies depression, anxiety or insomnia. Denies skin break down or rash.   PE  BP 108/75   Pulse 89   Resp 16   Ht 5\' 4"  (1.626 m)   Wt 181 lb (82.1 kg)   SpO2 97%   BMI 31.07 kg/m   Patient alert and oriented and in no cardiopulmonary distress.  HEENT: No facial asymmetry, EOMI,     Neck supple .  Chest: Clear to auscultation bilaterally.  CVS: S1, S2 no murmurs, no S3.Regular rate.  ABD: Soft non tender.   Ext: No edema   Assessment & Plan  Acute diverticulitis of intestine CT scan positive on 07/09/2020, febrile with chills last night and itching on cipro and f;lagyll. GI eval. Rocephin 500 mg iM in office   Allergic reaction to drug Generalized itching on cipro and flagyll, d/c both, refer to [facility] for management of diverticulitis Rocephin o in office today Phone consult and augmentin prescribed Will f/u in 7 to 10 days

## 2020-07-14 NOTE — Assessment & Plan Note (Signed)
Controlled, no change in medication  

## 2020-07-17 ENCOUNTER — Ambulatory Visit: Payer: Medicare HMO | Admitting: Family Medicine

## 2020-07-18 ENCOUNTER — Encounter (INDEPENDENT_AMBULATORY_CARE_PROVIDER_SITE_OTHER): Payer: Self-pay | Admitting: *Deleted

## 2020-07-19 ENCOUNTER — Ambulatory Visit: Payer: Medicare HMO | Admitting: Family Medicine

## 2020-07-21 IMAGING — MR MR ABDOMEN WO/W CM
8 of 18 series · 17 of 48 positions shown · IV contrast (7ml gadavist)
Comparison: CT abdomen/pelvis dated 06/12/2019

CLINICAL DATA: Follow-up small pancreatic lesion on 4382 outside
hospital CT

EXAM:
MRI ABDOMEN WITHOUT AND WITH CONTRAST
TECHNIQUE: Multiplanar multisequence MR imaging of the abdomen was performed
both before and after the administration of intravenous contrast.
CONTRAST:  7mL GADAVIST GADOBUTROL 1 MMOL/ML IV SOLN

[Series 3: T2 · axial · 5.0mm · 1.09mm/px · 1 of 40 slices shown (1 of 2)]
[im 1/40]
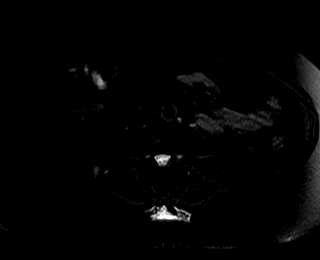

[Series 4: GRE · coronal · 5.0mm · 1.27mm/px · 1 of 36 slices shown]
[im 1/36]
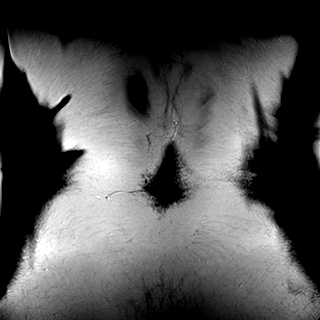

[Series 8: DWI · axial · 5.0mm · 0.88mm/px · z∈[-31,+202]mm · 2 of 80 slices shown]
[im 1/80]
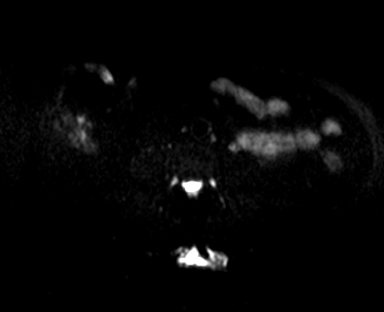
[im 80/80]
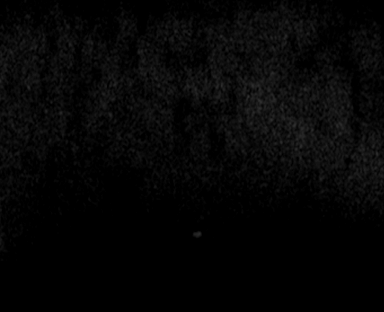

[Series 9: ax dwi_adc · axial · 5.0mm · 0.88mm/px · z∈[-31,+202]mm · 2 of 40 slices shown]
[im 1/40]
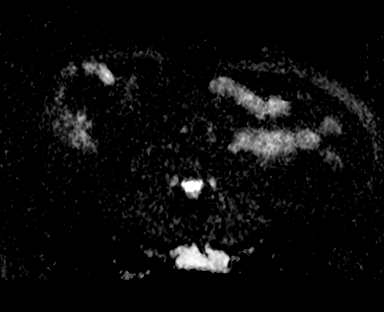
[im 40/40]
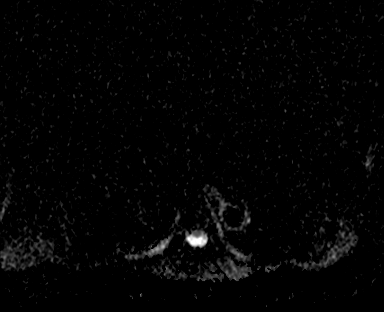

[Series 10: ax dual echo_in · axial · 4.0mm · 0.59mm/px · z∈[-39,+213]mm · 3 of 64 slices shown]
[im 1/64]
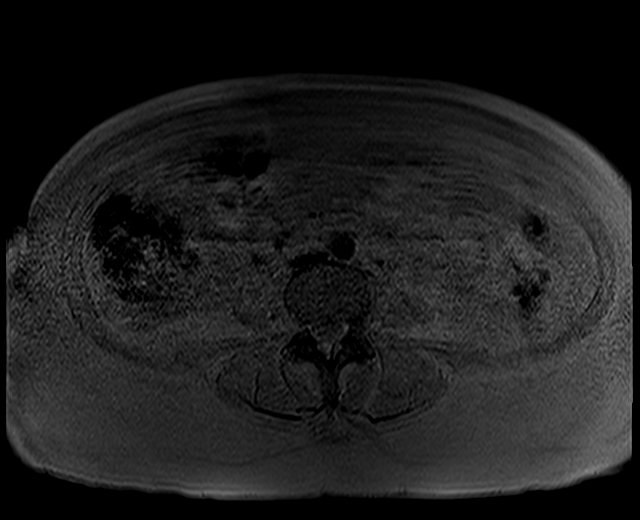
[im 32/64]
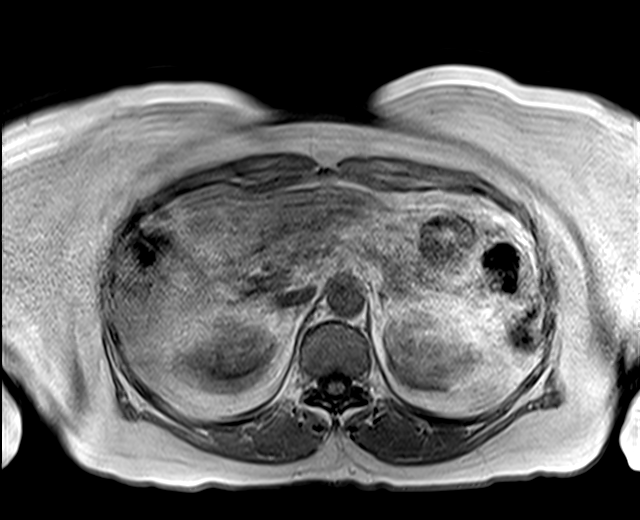
[im 64/64]
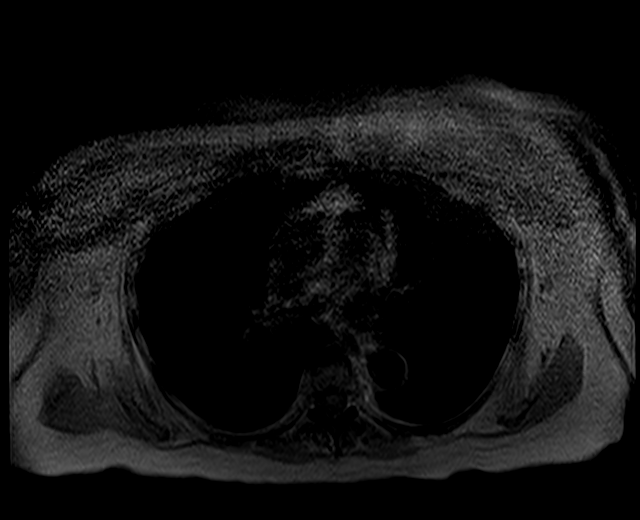

[Series 11: ax dual echo_opp · axial · 4.0mm · 0.59mm/px · z∈[-39,+213]mm · 3 of 64 slices shown]
[im 1/64]
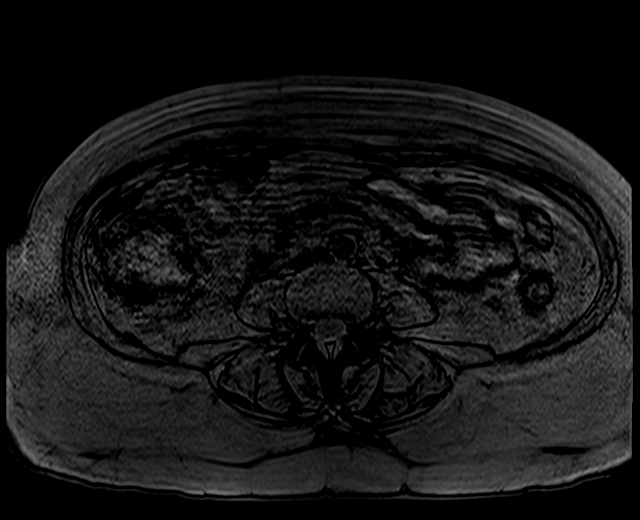
[im 32/64]
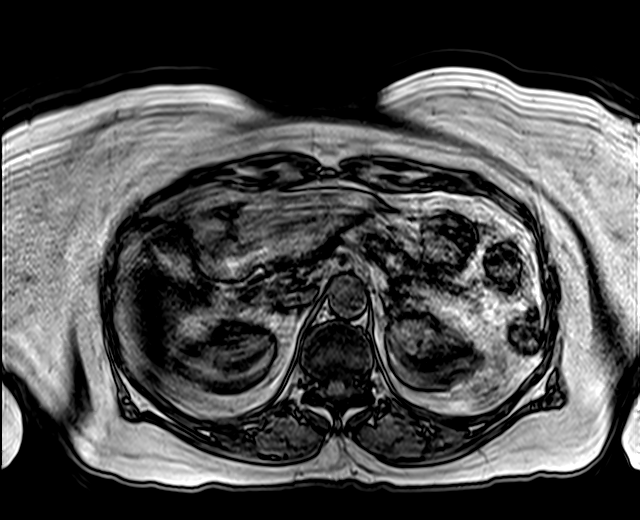
[im 64/64]
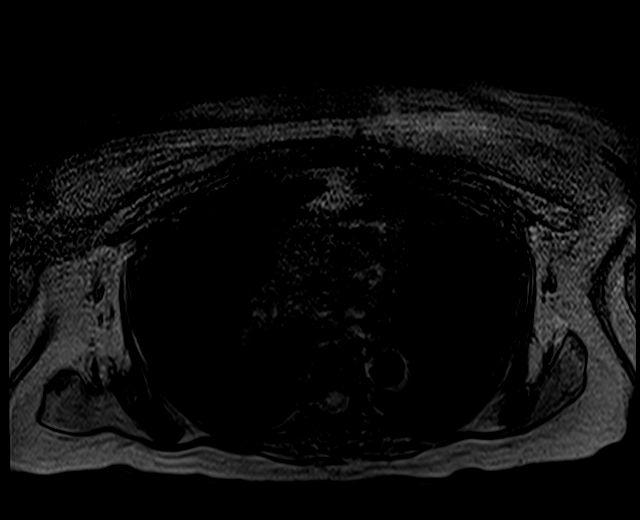

[Series 22: T2 · axial · 5.0mm · 1.48mm/px · z∈[-55,+179]mm · 2 of 40 slices shown (2 of 2)]
[im 1/40]
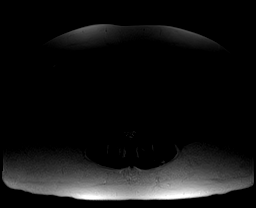
[im 40/40]
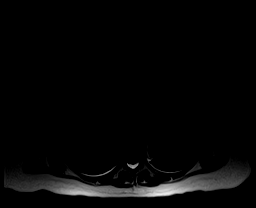

[Series 5002: sub_s23-c0_1 · axial · 3.7mm · 1.19mm/px · z∈[-69,+193]mm · 3 of 72 slices shown]
[im 1/72]
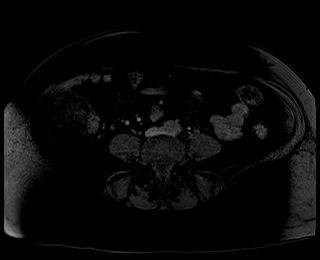
[im 36/72]
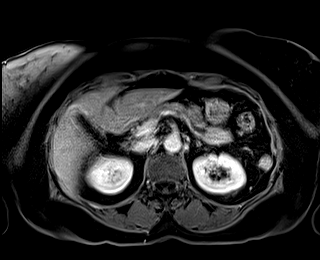
[im 72/72]
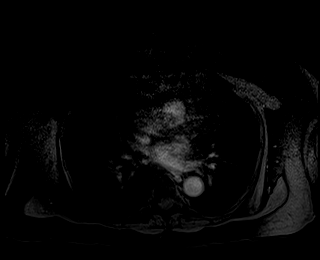

[17 of 48 positions shown; findings below may reference images not displayed]

FINDINGS: Motion degraded images.

Lower chest: Lung bases are clear.

Hepatobiliary: Liver is within normal limits. No hepatic steatosis.
No suspicious/enhancing hepatic lesions.

Gallbladder is unremarkable. No intrahepatic or extrahepatic ductal
dilatation.

Pancreas: Within normal limits. No pancreatic cystic lesion is
evident on MR, noting motion degradation.

Spleen:  Within normal limits.

Adrenals/Urinary Tract:  Adrenal glands are within normal limits.

Bilateral renal cysts, measuring up to 4.2 cm in the anterior left
upper kidney (series 22/image 23), benign (Bosniak II). No
hydronephrosis.

Stomach/Bowel: Stomach is within normal limits.

Visualized bowel is unremarkable.

Vascular/Lymphatic:  No evidence of abdominal aortic aneurysm.

No suspicious abdominal lymphadenopathy.

Other:  No abdominal ascites.

Musculoskeletal: No focal osseous lesions.
IMPRESSION: Motion degraded images.

Pancreas is within normal limits on MR. Specifically, no evidence of
IPMN.

Bilateral renal cysts, benign (Bosniak II).

## 2020-07-31 ENCOUNTER — Ambulatory Visit: Payer: Medicare HMO | Admitting: Family Medicine

## 2020-08-01 ENCOUNTER — Other Ambulatory Visit: Payer: Self-pay

## 2020-08-01 ENCOUNTER — Ambulatory Visit (HOSPITAL_COMMUNITY)
Admission: RE | Admit: 2020-08-01 | Discharge: 2020-08-01 | Disposition: A | Discharge status: Home / Self Care | Payer: Medicare HMO | Source: Ambulatory Visit | Attending: Family Medicine | Admitting: Family Medicine

## 2020-08-01 ENCOUNTER — Other Ambulatory Visit: Payer: Self-pay | Admitting: Family Medicine

## 2020-08-01 ENCOUNTER — Ambulatory Visit (INDEPENDENT_AMBULATORY_CARE_PROVIDER_SITE_OTHER): Payer: Medicare HMO | Admitting: Family Medicine

## 2020-08-01 VITALS — BP 132/68 | HR 99 | Resp 16 | Ht 64.0 in | Wt 180.0 lb

## 2020-08-01 DIAGNOSIS — I10 Essential (primary) hypertension: Secondary | ICD-10-CM | POA: Diagnosis not present

## 2020-08-01 DIAGNOSIS — T7840XD Allergy, unspecified, subsequent encounter: Secondary | ICD-10-CM

## 2020-08-01 DIAGNOSIS — R1013 Epigastric pain: Secondary | ICD-10-CM

## 2020-08-01 DIAGNOSIS — M79642 Pain in left hand: Secondary | ICD-10-CM | POA: Diagnosis not present

## 2020-08-01 DIAGNOSIS — K5792 Diverticulitis of intestine, part unspecified, without perforation or abscess without bleeding: Secondary | ICD-10-CM | POA: Diagnosis not present

## 2020-08-01 MED ORDER — FAMOTIDINE 40 MG PO TABS: 40.0000 mg | ORAL_TABLET | Freq: Every day | ORAL | 0 refills | Status: DC

## 2020-08-01 NOTE — Progress Notes (Signed)
   Jillian [name]     MRN: [id number]      DOB: Jan 11, 1941   HPI Jillian [name] is here for follow up of recent acute diverticulitis. Stillc/o lower abdominal pain, rated at a 6, takes pepto bismol when she eats or drinks, not able to eat much, trying to keep hydrated, sometimes liquids hurt her stomach  no other GI symptoms Went to UC re itch the day after last visit, got hydroxyzine which was beneficial [place] in September, missed step in low place, c/o soreness in left hand was swollen , now still has pain in base of digits and no wrist symptoms   ROS Denies recent fever or chills. Denies sinus pressure, nasal congestion, ear pain or sore throat. Denies chest congestion, productive cough or wheezing. Denies chest pains, palpitations and leg swelling Denies  nausea, vomiting,diarrhea or constipation.   Denies dysuria, frequency, hesitancy or incontinence. . Denies headaches, seizures, numbness, or tingling. Denies depression, anxiety or insomnia. Denies skin break down or rash.   PE  BP 132/68   Pulse 99   Resp 16   Ht 5\' 4"  (1.626 m)   Wt 180 lb (81.6 kg)   SpO2 97%   BMI 30.90 kg/m   Patient alert and oriented and in no cardiopulmonary distress.  HEENT: No facial asymmetry, EOMI,     Neck supple .  Chest: Clear to auscultation bilaterally.  CVS: S1, S2 no murmurs, no S3.Regular rate.  ABD: Soft ml;dly tender in epigastrium no guarding or rebound Tender in lower abdomen no guarding or rebound Normal BS No organomegaly or nmass.   Ext: No edema  MS: Adequate ROM spine, shoulders, hips and knees.Tender over left hand palmar aspect    Skin: Intact, no ulcerations or rash noted.  Psych: Good eye contact, normal affect. Memory intact not anxious or depressed appearing.  CNS: CN 2-12 intact, power,  normal throughout.no focal deficits noted.   Assessment & Plan  Acute diverticulitis of intestine Improved symptoms however still complains of abdominal discomfort  more in the epigastric area with early satiety and poor appetite.  She is to keep follow-up with GI.  Denies fever chills or bloody stool.  Allergic reaction to drug Following recent visit when both ciprofloxacin and Flagyl worse discontinued because of allergic reaction to patient went to the urgent care the following day for hydroxyzine to relieve the itching.  This is subsequently resolved.  She is able to tolerate the Augmentin which she took an entire course of for her acute diverticulitis.  Left hand pain Left hand pain status post direct injury in a fall approximately 2 months prior.  X-ray to further evaluate.  ESSENTIAL HYPERTENSION, BENIGN Controlled, no change in medication DASH diet and commitment to daily physical activity for a minimum of 30 minutes discussed and encouraged, as a part of hypertension management. The importance of attaining a healthy weight is also discussed.  BP/Weight 08/01/2020 07/13/2020 07/12/2020 07/09/2020 06/20/2020 05/08/2020 8/50/2774  Systolic BP 128 786 767 209 [phone]  Diastolic BP 68 75 75 80 74 66 74  Wt. (Lbs) 180 - 181 183 182 183.42 181  BMI 30.9 - 31.07 31.41 31.24 31.48 31.07       Epigastric pain Reports epigastric pain and early satiety following recent acute diverticulitis.  Trial of Pepcid twice daily.  Has GI appointment which she is taking.

## 2020-08-01 NOTE — Patient Instructions (Addendum)
F/U as before, call if you need me sooner  New for upper abdominal pain is pepcid once daily  NEED to keep appointment with GI Doctor   Please  Get X ray of hand today

## 2020-08-05 ENCOUNTER — Encounter: Payer: Self-pay | Admitting: Family Medicine

## 2020-08-05 DIAGNOSIS — R1013 Epigastric pain: Secondary | ICD-10-CM | POA: Insufficient documentation

## 2020-08-05 NOTE — Assessment & Plan Note (Signed)
Controlled, no change in medication DASH diet and commitment to daily physical activity for a minimum of 30 minutes discussed and encouraged, as a part of hypertension management. The importance of attaining a healthy weight is also discussed.  BP/Weight 08/01/2020 07/13/2020 07/12/2020 07/09/2020 06/20/2020 05/08/2020 05/31/300  Systolic BP 499 [phone] 584  Diastolic BP 68 75 75 80 74 66 74  Wt. (Lbs) 180 - 181 183 182 183.42 181  BMI 30.9 - 31.07 31.41 31.24 31.48 31.07

## 2020-08-05 NOTE — Assessment & Plan Note (Signed)
Improved symptoms however still complains of abdominal discomfort more in the epigastric area with early satiety and poor appetite.  She is to keep follow-up with GI.  Denies fever chills or bloody stool.

## 2020-08-05 NOTE — Assessment & Plan Note (Signed)
Left hand pain status post direct injury in a fall approximately 2 months prior.  X-ray to further evaluate.

## 2020-08-05 NOTE — Assessment & Plan Note (Signed)
Reports epigastric pain and early satiety following recent acute diverticulitis.  Trial of Pepcid twice daily.  Has GI appointment which she is taking.

## 2020-08-05 NOTE — Assessment & Plan Note (Signed)
Following recent visit when both ciprofloxacin and Flagyl worse discontinued because of allergic reaction to patient went to the urgent care the following day for hydroxyzine to relieve the itching.  This is subsequently resolved.  She is able to tolerate the Augmentin which she took an entire course of for her acute diverticulitis.

## 2020-08-13 ENCOUNTER — Other Ambulatory Visit: Payer: Self-pay | Admitting: Family Medicine

## 2020-08-20 ENCOUNTER — Other Ambulatory Visit: Payer: Self-pay

## 2020-08-20 ENCOUNTER — Encounter (INDEPENDENT_AMBULATORY_CARE_PROVIDER_SITE_OTHER): Payer: Self-pay | Admitting: Gastroenterology

## 2020-08-20 ENCOUNTER — Ambulatory Visit (INDEPENDENT_AMBULATORY_CARE_PROVIDER_SITE_OTHER): Payer: Medicare HMO | Admitting: Gastroenterology

## 2020-08-20 VITALS — BP 132/82 | HR 77 | Temp 98.2°F | Ht 64.0 in | Wt 184.0 lb

## 2020-08-20 DIAGNOSIS — K5732 Diverticulitis of large intestine without perforation or abscess without bleeding: Secondary | ICD-10-CM | POA: Diagnosis not present

## 2020-08-20 NOTE — Progress Notes (Signed)
Maylon Peppers, M.D. Gastroenterology & Hepatology [facility] [address] [place], [place] [address] Primary Care Physician: Fayrene Helper, MD [address], [place] [address]  Referring MD: PCP  I will communicate my assessment and recommendations to the referring MD via EMR. Note: Occasional unusual wording and randomly placed punctuation marks may result from the use of speech recognition technology to transcribe this document"  Chief Complaint: Diverticulitis  History of Present Illness: Jillian [name] is a 79 y.o. female with past medical history of previous episodes of acute diverticulitis, diabetes, hyperlipidemia, hypertension, obesity and degenerative joint disease, who presents for evaluation of after recent episode of acute diverticulitis.  Patient reports that during mid October she had new onset of left lower quadrant abdominal pain as well as low-grade fever without diarrhea or rectal bleeding.  Patient reports that she ate popcorn before her symptoms started in mid October.  Due to this, her PCP order at CT abdomen and pelvis without IV contrast which was performed on 07/10/2020.  Imaging showed acute sigmoid diverticulitis without abscess. Patient received treatment with augmentin with complete resolution of her symptoms.  Patient states she has had several episodes of diverticulitis in the past but she can't recall exactly how many episodes. This is the first episode this year.  Notably, the patient states that she takes Aleve occasionally for knee pain.  Patient states feeling asymptomatic at this moment.  States that she was referred by her PCP to evaluate the etiology of her recurrent diverticulitis.  She denies any nausea, vomiting, fever, chills, hematochezia, melena, hematemesis, abdominal distention, abdominal pain, diarrhea, jaundice, pruritus or weight loss. She has 1 normal formed BM  daily.  Last Colonoscopy: 09/21/2019 - diverticulosis in sigmoid and DC, internal hemorrhoids, tortuous colon.  FHx: neg for any gastrointestinal/liver disease, no malignancies Social: neg smoking, alcohol or illicit drug use Surgical: appendectomy, hysterectomy  Past Medical History: Past Medical History:  Diagnosis Date  . Atypical ductal hyperplasia of breast   . Chronic fatigue   . Diabetes mellitus 2011  . DJD (degenerative joint disease)    of neck    . Dyslipidemia   . 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 Hypertension 2005  . Mild obesity   . Postmenopausal   . Rectal bleeding   . Viral meningitis   . Wears glasses     Past Surgical History: Past Surgical History:  Procedure Laterality Date  . ABDOMINAL HYSTERECTOMY  1992   fibroids  . APPENDECTOMY    . BREAST EXCISIONAL BIOPSY Right    benign  . BREAST LUMPECTOMY WITH RADIOACTIVE SEED LOCALIZATION Right 10/05/2014   Procedure: BREAST LUMPECTOMY WITH RADIOACTIVE SEED LOCALIZATION;  Surgeon: Erroll Luna, MD;  Location: [facility];  Service: General;  Laterality: Right;  . cataract Bilateral   . COLONOSCOPY  2013   Dr. Oneida Alar :  internal hemorrhoids, mild left-sided diverticulosis  . COLONOSCOPY N/A 09/21/2019   Procedure: COLONOSCOPY;  Surgeon: Danie Binder, MD;  [facility];  Service: Endoscopy;  Laterality: N/A;  12:00pm  . CYST EXCISION Right    arm  . ESOPHAGOGASTRODUODENOSCOPY  2013   Dr. Oneida Alar: gastritis likely NSAID-induced. Duodenal polyp. Duodenitis and gastritis in setting of aspirin and Ibuprofen.   Marland Kitchen EYE SURGERY Right 06/07/2013   cataract  . EYE SURGERY Left 09/14/2013   cataract  . KNEE ARTHROSCOPY  Right 02/26/2016   Procedure: ARTHROSCOPY RIGHT KNEE WITH MENSICAL DEBRIDEMENT;  Surgeon: Gaynelle Arabian, MD;  [facility];  Service: Orthopedics;  Laterality: Right;  . left breast biopsy    . LESION  REMOVAL Right 03/30/2013   Procedure: EXCISION OF NEOPLASM ARM;  Surgeon: Jamesetta So, MD;  [facility];  Service: General;  Laterality: Right;  . TUBAL LIGATION      Family History: Family History  Problem Relation Age of Onset  . Hypertension Father   . Congestive Heart Failure Father   . Liver cancer Mother   . Hypertension Sister   . Hypertension Brother   . Diabetes Brother   . Colon cancer Neg Hx   . Colon polyps Neg Hx     Social History: Social History   Tobacco Use  Smoking Status Never Smoker  Smokeless Tobacco Never Used   Social History   Substance and Sexual Activity  Alcohol Use No   Social History   Substance and Sexual Activity  Drug Use No    Allergies: Allergies  Allergen Reactions  . Ciprofloxacin Itching  . Metronidazole Itching  . Codeine Nausea And Vomiting  . Propoxyphene N-Acetaminophen Other (See Comments)    hallucinations    Medications: Current Outpatient Medications  Medication Sig Dispense Refill  . acetaminophen (TYLENOL) 500 MG tablet Take 1,000 mg by mouth every 6 (six) hours as needed for mild pain.    Marland Kitchen aspirin EC 81 MG tablet Take 1 tablet (81 mg total) by mouth every evening. (Patient taking differently: Take 81 mg by mouth at bedtime. ) 90 tablet 1  . benazepril (LOTENSIN) 5 MG tablet TAKE 1 TABLET EVERY DAY 90 tablet 0  . cloNIDine (CATAPRES) 0.2 MG tablet TAKE 1 TABLET ONE TIME DAILY AT BEDTIME FOR BLOOD PRESSURE 90 tablet 3  . cyclobenzaprine (FLEXERIL) 10 MG tablet TAKE 1 TABLET BY MOUTH AT BEDTIME AS NEEDED FOR BACK SPASM (Patient taking differently: Take 10 mg by mouth at bedtime as needed for muscle spasms. ) 20 tablet 0  . famotidine (PEPCID) 40 MG tablet TAKE 1 TABLET(40 MG) BY MOUTH DAILY 90 tablet 0  . ferrous sulfate 325 (65 FE) MG EC tablet TAKE 1 TABLET EVERY DAY 90 tablet 2  . glucose blood test strip Use as instructed to check blood glucose once daily 100 each 0  . hydrOXYzine (ATARAX/VISTARIL) 25 MG  tablet Take 1 tablet (25 mg total) by mouth every 6 (six) hours as needed for itching. 30 tablet 0  . metFORMIN (GLUCOPHAGE-XR) 500 MG 24 hr tablet TAKE 1 TABLET TWICE DAILY 180 tablet 0  . pravastatin (PRAVACHOL) 10 MG tablet TAKE 1 TABLET EVERY DAY 90 tablet 0  . vitamin B-12 (CYANOCOBALAMIN) 1000 MCG tablet Take 1,000 mcg by mouth every Monday, Wednesday, and Friday.     No current facility-administered medications for this visit.    Review of Systems: GENERAL: negative for malaise, night sweats HEENT: No changes in hearing or vision, no nose bleeds or other nasal problems. NECK: Negative for lumps, goiter, pain and significant neck swelling RESPIRATORY: Negative for cough, wheezing CARDIOVASCULAR: Negative for chest pain, leg swelling, palpitations, orthopnea GI: SEE HPI MUSCULOSKELETAL: Negative for joint pain or swelling, back pain, and muscle pain. SKIN: Negative for lesions, rash PSYCH: Negative for sleep disturbance, mood disorder and recent psychosocial stressors. HEMATOLOGY Negative for prolonged bleeding, bruising easily, and swollen nodes. ENDOCRINE: Negative for cold or heat intolerance, polyuria, polydipsia and goiter. NEURO: negative for tremor, gait imbalance,  syncope and seizures. The remainder of the review of systems is noncontributory.   Physical Exam: BP 132/82 (BP Location: Left Arm, Patient Position: Sitting, Cuff Size: Large)   Pulse 77   Temp 98.2 F (36.8 C) (Oral)   Ht 5\' 4"  (1.626 m)   Wt 184 lb (83.5 kg)   BMI 31.58 kg/m  GENERAL: The patient is AO x3, in no acute distress. HEENT: Head is normocephalic and atraumatic. EOMI are intact. Mouth is well hydrated and without lesions. NECK: Supple. No masses LUNGS: Clear to auscultation. No presence of rhonchi/wheezing/rales. Adequate chest expansion HEART: RRR, normal s1 and s2. ABDOMEN: Soft, nontender, no guarding, no peritoneal signs, and nondistended. BS +. No masses. EXTREMITIES: Without any  cyanosis, clubbing, rash, lesions or edema. NEUROLOGIC: AOx3, no focal motor deficit. SKIN: no jaundice, no rashes   Imaging/Labs: as above  I personally reviewed and interpreted the available labs, imaging and endoscopic files.  Impression and Plan: Jillian [name] is a 79 y.o. female with past medical history of previous episodes of acute diverticulitis, diabetes, hyperlipidemia, hypertension, obesity and degenerative joint disease, who presents for evaluation of after recent episode of acute diverticulitis.  The patient had resolution of her episode of diverticulitis with antibiotics.  No further intervention is warranted regarding this as she had a colonoscopy last year that did not show any masses or alterations that will increase her risk of diverticulitis.  I advised the patient to avoid taking any NSAIDs as these may increase her risk of more episodes.  She would rather avoid taking popcorn as she considers this triggered her last episode.  I told her that this could be considered initial as there is no proof that seeds increase the risk of diverticulitis.  However, she will avoid taking this food which I consider is okay.  - No need for endoscopic evaluation - Avoid NSAIDs  All questions were answered.      Maylon Peppers, MD Gastroenterology and Hepatology [facility]

## 2020-08-20 NOTE — Patient Instructions (Signed)
Avoid using NSAIDs such as Aleve, ibuprofen, naproxen, Motrin, Voltaren or Advil (even the topical ones)

## 2020-10-29 ENCOUNTER — Other Ambulatory Visit: Payer: Self-pay | Admitting: Family Medicine

## 2020-11-19 ENCOUNTER — Telehealth: Payer: Medicare HMO | Admitting: Family Medicine

## 2020-11-21 DIAGNOSIS — H524 Presbyopia: Secondary | ICD-10-CM | POA: Diagnosis not present

## 2020-11-26 ENCOUNTER — Ambulatory Visit (INDEPENDENT_AMBULATORY_CARE_PROVIDER_SITE_OTHER): Payer: Medicare HMO | Admitting: Nurse Practitioner

## 2020-11-26 ENCOUNTER — Other Ambulatory Visit: Payer: Self-pay

## 2020-11-26 ENCOUNTER — Encounter: Payer: Self-pay | Admitting: Nurse Practitioner

## 2020-11-26 DIAGNOSIS — I10 Essential (primary) hypertension: Secondary | ICD-10-CM | POA: Diagnosis not present

## 2020-11-26 DIAGNOSIS — E1159 Type 2 diabetes mellitus with other circulatory complications: Secondary | ICD-10-CM

## 2020-11-26 DIAGNOSIS — M79642 Pain in left hand: Secondary | ICD-10-CM

## 2020-11-26 MED ORDER — LISINOPRIL 2.5 MG PO TABS: 2.5000 mg | ORAL_TABLET | Freq: Every day | ORAL | 1 refills | Status: DC

## 2020-11-26 NOTE — Assessment & Plan Note (Signed)
-  improved, no issues today

## 2020-11-26 NOTE — Progress Notes (Addendum)
Established Patient Office Visit  Subjective:  Patient ID: Jillian [name], female    DOB: [birth year]  Age: 80 y.o. MRN: [id number]  CC:  Chief Complaint  Patient presents with  . Diabetes    Follow up  . Ankle Pain    L ankle pain     HPI [name] presents for follow-up visit. At last OV, she was treated for diverticulitis, drug allergy, HTN, and left hand pain.  Her blood sugar was 117 this morning, but her blood sugars have been well controlled.  She checks her BP, and it has been running around 140/76.  She had not been taking benazepril because she states that her BP had been running too low.  Past Medical History:  Diagnosis Date  . Atypical ductal hyperplasia of breast   . Chronic fatigue   . Diabetes mellitus 2011  . DJD (degenerative joint disease)    of neck    . Dyslipidemia   . 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 Hypertension 2005  . Mild obesity   . Postmenopausal   . Rectal bleeding   . Viral meningitis   . Wears glasses     Past Surgical History:  Procedure Laterality Date  . ABDOMINAL HYSTERECTOMY  1992   fibroids  . APPENDECTOMY    . BREAST EXCISIONAL BIOPSY Right    benign  . BREAST LUMPECTOMY WITH RADIOACTIVE SEED LOCALIZATION Right 10/05/2014   Procedure: BREAST LUMPECTOMY WITH RADIOACTIVE SEED LOCALIZATION;  Surgeon: Erroll Luna, MD;  Location: [facility];  Service: General;  Laterality: Right;  . cataract Bilateral   . COLONOSCOPY  2013   Dr. Oneida Alar :  internal hemorrhoids, mild left-sided diverticulosis  . COLONOSCOPY N/A 09/21/2019   Procedure: COLONOSCOPY;  Surgeon: Danie Binder, MD;  [facility];  Service: Endoscopy;  Laterality: N/A;  12:00pm  . CYST EXCISION Right    arm  . ESOPHAGOGASTRODUODENOSCOPY  2013   Dr. Oneida Alar: gastritis likely NSAID-induced. Duodenal polyp. Duodenitis and gastritis in setting of aspirin and Ibuprofen.   Marland Kitchen  EYE SURGERY Right 06/07/2013   cataract  . EYE SURGERY Left 09/14/2013   cataract  . KNEE ARTHROSCOPY Right 02/26/2016   Procedure: ARTHROSCOPY RIGHT KNEE WITH MENSICAL DEBRIDEMENT;  Surgeon: Gaynelle Arabian, MD;  [facility];  Service: Orthopedics;  Laterality: Right;  . left breast biopsy    . LESION REMOVAL Right 03/30/2013   Procedure: EXCISION OF NEOPLASM ARM;  Surgeon: Jamesetta So, MD;  [facility];  Service: General;  Laterality: Right;  . TUBAL LIGATION      Family History  Problem Relation Age of Onset  . Hypertension Father   . Congestive Heart Failure Father   . Liver cancer Mother   . Hypertension Sister   . Hypertension Brother   . Diabetes Brother   . Colon cancer Neg Hx   . Colon polyps Neg Hx     Social History   Socioeconomic History  . Marital status: Widowed    Spouse name: Not on file  . Number of children: Not on file  . Years of education: Not on file  . Highest education level: Not on file  Occupational History  . Not on file  Tobacco Use  . Smoking status: Never Smoker  . Smokeless tobacco: Never Used  Vaping Use  . Vaping Use: Never used  Substance and Sexual Activity  .  Alcohol use: No  . Drug use: No  . Sexual activity: Not Currently    Birth control/protection: Surgical  Other Topics Concern  . Not on file  Social History Narrative  . Not on file   Social Determinants of Health   Financial Resource Strain: Low Risk   . Difficulty of Paying Living Expenses: Not hard at all  Food Insecurity: No Food Insecurity  . Worried About Charity fundraiser in the Last Year: Never true  . Ran Out of Food in the Last Year: Never true  Transportation Needs: No Transportation Needs  . Lack of Transportation (Medical): No  . Lack of Transportation (Non-Medical): No  Physical Activity: Inactive  . Days of Exercise per Week: 0 days  . Minutes of Exercise per Session: 0 min  Stress: No Stress Concern Present  . Feeling of Stress : Only a  little  Social Connections: Moderately Integrated  . Frequency of Communication with Friends and Family: More than three times a week  . Frequency of Social Gatherings with Friends and Family: More than three times a week  . Attends Religious Services: 1 to 4 times per year  . Active Member of Clubs or Organizations: Yes  . Attends Archivist Meetings: More than 4 times per year  . Marital Status: Widowed  Intimate Partner Violence: Not on file    Outpatient Medications Prior to Visit  Medication Sig Dispense Refill  . acetaminophen (TYLENOL) 500 MG tablet Take 1,000 mg by mouth every 6 (six) hours as needed for mild pain.    Marland Kitchen aspirin EC 81 MG tablet Take 1 tablet (81 mg total) by mouth every evening. (Patient taking differently: Take 81 mg by mouth at bedtime.) 90 tablet 1  . cloNIDine (CATAPRES) 0.2 MG tablet TAKE 1 TABLET ONE TIME DAILY AT BEDTIME FOR BLOOD PRESSURE 90 tablet 3  . cyclobenzaprine (FLEXERIL) 10 MG tablet TAKE 1 TABLET BY MOUTH AT BEDTIME AS NEEDED FOR BACK SPASM (Patient taking differently: Take 10 mg by mouth at bedtime as needed for muscle spasms.) 20 tablet 0  . famotidine (PEPCID) 40 MG tablet TAKE 1 TABLET(40 MG) BY MOUTH DAILY 90 tablet 0  . ferrous sulfate 325 (65 FE) MG EC tablet TAKE 1 TABLET EVERY DAY 90 tablet 2  . glucose blood test strip Use as instructed to check blood glucose once daily 100 each 0  . hydrOXYzine (ATARAX/VISTARIL) 25 MG tablet Take 1 tablet (25 mg total) by mouth every 6 (six) hours as needed for itching. 30 tablet 0  . metFORMIN (GLUCOPHAGE-XR) 500 MG 24 hr tablet TAKE 1 TABLET TWICE DAILY 180 tablet 0  . pravastatin (PRAVACHOL) 10 MG tablet TAKE 1 TABLET EVERY DAY 90 tablet 0  . vitamin B-12 (CYANOCOBALAMIN) 1000 MCG tablet Take 1,000 mcg by mouth every Monday, Wednesday, and Friday.    . benazepril (LOTENSIN) 5 MG tablet TAKE 1 TABLET EVERY DAY (Patient not taking: Reported on 11/26/2020) 90 tablet 0   No facility-administered  medications prior to visit.    Allergies  Allergen Reactions  . Ciprofloxacin Itching  . Metronidazole Itching  . Codeine Nausea And Vomiting  . Propoxyphene N-Acetaminophen Other (See Comments)    hallucinations    ROS Review of Systems  Constitutional: Negative.   Respiratory: Negative.   Cardiovascular: Negative.   Endocrine: Negative.   Psychiatric/Behavioral: Negative.       Objective:    Physical Exam Constitutional:      Appearance: She is obese.  Cardiovascular:     Rate and Rhythm: Normal rate and regular rhythm.     Pulses: Normal pulses.     Heart sounds: Normal heart sounds.  Pulmonary:     Effort: Pulmonary effort is normal.     Breath sounds: Normal breath sounds.  Musculoskeletal:        General: Normal range of motion.  Neurological:     Mental Status: She is alert.  Psychiatric:        Mood and Affect: Mood normal.        Behavior: Behavior normal.        Thought Content: Thought content normal.        Judgment: Judgment normal.     BP (!) 144/78   Pulse 85   Temp 98.2 F (36.8 C)   Resp 18   Ht _0  (1.626 m)   Wt 188 lb (85.3 kg)   SpO2 93%   BMI 32.27 kg/m  Wt Readings from Last 3 Encounters:  11/26/20 188 lb (85.3 kg)  08/20/20 184 lb (83.5 kg)  08/01/20 180 lb (81.6 kg)     Health Maintenance Due  Topic Date Due  . COVID-19 Vaccine (3 - Moderna risk 4-dose series) 11/28/2019    There are no preventive care reminders to display for this patient.  Lab Results  Component Value Date   TSH 1.29 01/30/2020   Lab Results  Component Value Date   WBC 9.4 07/09/2020   HGB 11.9 07/09/2020   HCT 36.7 07/09/2020   MCV 92.7 07/09/2020   PLT 168 07/09/2020   Lab Results  Component Value Date   NA 142 07/09/2020   K 4.0 07/09/2020   CO2 24 07/09/2020   GLUCOSE 106 07/09/2020   BUN 11 07/09/2020   CREATININE 0.90 07/09/2020   BILITOT 0.4 07/09/2020   ALKPHOS 42 05/07/2020   AST 15 07/09/2020   ALT 9 07/09/2020   PROT  6.5 07/09/2020   ALBUMIN 3.6 05/07/2020   CALCIUM 9.1 07/09/2020   ANIONGAP 8 05/07/2020   Lab Results  Component Value Date   CHOL 141 07/03/2020   Lab Results  Component Value Date   HDL 49 (L) 07/03/2020   Lab Results  Component Value Date   LDLCALC 72 07/03/2020   Lab Results  Component Value Date   TRIG 118 07/03/2020   Lab Results  Component Value Date   CHOLHDL 2.9 07/03/2020   Lab Results  Component Value Date   HGBA1C 6.3 (A) 07/09/2020      Assessment & Plan:   Problem List Items Addressed This Visit      Cardiovascular and Mediastinum   Type 2 diabetes mellitus with vascular disease ([place])    -blood sugar is well controlled per her report -A1c and lab collected today -on statin and ACEi      Relevant Medications   lisinopril (ZESTRIL) 2.5 MG tablet   Other Relevant Orders   CBC with Differential/Platelet   CMP14+EGFR   Hemoglobin A1c   Lipid Panel With LDL/HDL Ratio   Microalbumin / creatinine urine ratio   Hypertension   Relevant Medications   lisinopril (ZESTRIL) 2.5 MG tablet   Other Relevant Orders   CBC with Differential/Platelet   CMP14+EGFR   Lipid Panel With LDL/HDL Ratio     Other   Left hand pain    -improved, no issues today         Meds ordered this encounter  Medications  . lisinopril (ZESTRIL) 2.5 MG  tablet    Sig: Take 1 tablet (2.5 mg total) by mouth daily.    Dispense:  90 tablet    Refill:  1    Follow-up: Return in about 6 months (around 05/29/2021) for Lab follow-up (diabetes and HTN).    Noreene Larsson, NP

## 2020-11-26 NOTE — Assessment & Plan Note (Addendum)
-  blood sugar is well controlled per her report -A1c and lab collected today -on statin and ACEi

## 2020-11-26 NOTE — Patient Instructions (Signed)

## 2020-11-27 LAB — CBC WITH DIFFERENTIAL/PLATELET
Basophils Absolute: 0 10*3/uL (ref 0.0–0.2)
Basos: 0 %
EOS (ABSOLUTE): 0 10*3/uL (ref 0.0–0.4)
Eos: 0 %
Hematocrit: 37.5 % (ref 34.0–46.6)
Hemoglobin: 12.4 g/dL (ref 11.1–15.9)
Immature Grans (Abs): 0 10*3/uL (ref 0.0–0.1)
Immature Granulocytes: 1 %
Lymphocytes Absolute: 2.4 10*3/uL (ref 0.7–3.1)
Lymphs: 39 %
MCH: 30 pg (ref 26.6–33.0)
MCHC: 33.1 g/dL (ref 31.5–35.7)
MCV: 91 fL (ref 79–97)
Monocytes Absolute: 0.5 10*3/uL (ref 0.1–0.9)
Monocytes: 8 %
Neutrophils Absolute: 3.2 10*3/uL (ref 1.4–7.0)
Neutrophils: 52 %
Platelets: 188 10*3/uL (ref 150–450)
RBC: 4.14 x10E6/uL (ref 3.77–5.28)
RDW: 13.8 % (ref 11.7–15.4)
WBC: 6.1 10*3/uL (ref 3.4–10.8)

## 2020-11-27 LAB — CMP14+EGFR
ALT: 11 IU/L (ref 0–32)
AST: 14 IU/L (ref 0–40)
Albumin/Globulin Ratio: 1.4 (ref 1.2–2.2)
Albumin: 4 g/dL (ref 3.7–4.7)
Alkaline Phosphatase: 83 IU/L (ref 44–121)
BUN/Creatinine Ratio: 11 — ABNORMAL LOW (ref 12–28)
BUN: 11 mg/dL (ref 8–27)
Bilirubin Total: 0.4 mg/dL (ref 0.0–1.2)
CO2: 21 mmol/L (ref 20–29)
Calcium: 9.2 mg/dL (ref 8.7–10.3)
Chloride: 107 mmol/L — ABNORMAL HIGH (ref 96–106)
Creatinine, Ser: 1.03 mg/dL — ABNORMAL HIGH (ref 0.57–1.00)
Globulin, Total: 2.9 g/dL (ref 1.5–4.5)
Glucose: 106 mg/dL — ABNORMAL HIGH (ref 65–99)
Potassium: 4 mmol/L (ref 3.5–5.2)
Sodium: 144 mmol/L (ref 134–144)
Total Protein: 6.9 g/dL (ref 6.0–8.5)
eGFR: 56 mL/min/{1.73_m2} — ABNORMAL LOW (ref >59)

## 2020-11-27 LAB — LIPID PANEL WITH LDL/HDL RATIO
Cholesterol, Total: 150 mg/dL (ref 100–199)
HDL: 52 mg/dL (ref >39)
LDL Chol Calc (NIH): 79 mg/dL (ref 0–99)
LDL/HDL Ratio: 1.5 ratio (ref 0.0–3.2)
Triglycerides: 105 mg/dL (ref 0–149)
VLDL Cholesterol Cal: 19 mg/dL (ref 5–40)

## 2020-11-27 LAB — HEMOGLOBIN A1C
Est. average glucose Bld gHb Est-mCnc: 146 mg/dL
Hgb A1c MFr Bld: 6.7 % — ABNORMAL HIGH (ref 4.8–5.6)

## 2020-11-27 LAB — MICROALBUMIN / CREATININE URINE RATIO

## 2020-11-27 NOTE — Progress Notes (Signed)
Her A1c is 6.7 which is at goal. Her kidney function is stable, but drinking more water can improve those labs.

## 2020-11-28 DIAGNOSIS — M170 Bilateral primary osteoarthritis of knee: Secondary | ICD-10-CM | POA: Diagnosis not present

## 2020-11-28 DIAGNOSIS — M25561 Pain in right knee: Secondary | ICD-10-CM | POA: Diagnosis not present

## 2021-01-22 ENCOUNTER — Ambulatory Visit (INDEPENDENT_AMBULATORY_CARE_PROVIDER_SITE_OTHER): Payer: Medicare HMO | Admitting: Family Medicine

## 2021-01-22 ENCOUNTER — Other Ambulatory Visit: Payer: Self-pay

## 2021-01-22 ENCOUNTER — Encounter: Payer: Self-pay | Admitting: Family Medicine

## 2021-01-22 VITALS — BP 150/80 | HR 96 | Resp 16 | Ht 64.0 in | Wt 189.4 lb

## 2021-01-22 DIAGNOSIS — Z6830 Body mass index (BMI) 30.0-30.9, adult: Secondary | ICD-10-CM | POA: Diagnosis not present

## 2021-01-22 DIAGNOSIS — G44221 Chronic tension-type headache, intractable: Secondary | ICD-10-CM | POA: Diagnosis not present

## 2021-01-22 DIAGNOSIS — G8929 Other chronic pain: Secondary | ICD-10-CM

## 2021-01-22 DIAGNOSIS — E785 Hyperlipidemia, unspecified: Secondary | ICD-10-CM | POA: Diagnosis not present

## 2021-01-22 DIAGNOSIS — M79672 Pain in left foot: Secondary | ICD-10-CM

## 2021-01-22 DIAGNOSIS — E6609 Other obesity due to excess calories: Secondary | ICD-10-CM

## 2021-01-22 DIAGNOSIS — I10 Essential (primary) hypertension: Secondary | ICD-10-CM

## 2021-01-22 MED ORDER — BENAZEPRIL HCL 5 MG PO TABS: 5.0000 mg | ORAL_TABLET | Freq: Every day | ORAL | 3 refills | Status: DC

## 2021-01-22 MED ORDER — CLONIDINE HCL 0.3 MG PO TABS: ORAL_TABLET | ORAL | 2 refills | Status: DC

## 2021-01-22 NOTE — Assessment & Plan Note (Signed)
Hyperlipidemia:Low fat diet discussed and encouraged.   Lipid Panel  Lab Results  Component Value Date   CHOL 150 11/26/2020   HDL 52 11/26/2020   LDLCALC 79 11/26/2020   TRIG 105 11/26/2020   CHOLHDL 2.9 07/03/2020

## 2021-01-22 NOTE — Assessment & Plan Note (Signed)
  Patient re-educated about  the importance of commitment to a  minimum of 150 minutes of exercise per week as able.  The importance of healthy food choices with portion control discussed, as well as eating regularly and within a 12 hour window most days. The need to choose "clean , green" food 50 to 75% of the time is discussed, as well as to make water the primary drink and set a goal of 64 ounces water daily.    Weight /BMI 01/22/2021 11/26/2020 08/20/2020  WEIGHT 189 lb 6.4 oz 188 lb 184 lb  HEIGHT 5\' 4"  5\' 4"  5\' 4"   BMI 32.51 kg/m2 32.27 kg/m2 31.58 kg/m2

## 2021-01-22 NOTE — Assessment & Plan Note (Signed)
Uncontrolled, inc clonidine to 0.3 mg one at bedtime, continue benazepril as before  DASH diet and commitment to daily physical activity for a minimum of 30 minutes discussed and encouraged, as a part of hypertension management. The importance of attaining a healthy weight is also discussed.  BP/Weight 01/22/2021 11/26/2020 08/20/2020 08/01/2020 07/13/2020 07/12/2020 15/52/0802  Systolic BP 233 612 244 975 300 511 021  Diastolic BP 80 78 82 68 75 75 80  Wt. (Lbs) 189.4 188 184 180 - 181 183  BMI 32.51 32.27 31.58 30.9 - 31.07 31.41

## 2021-01-22 NOTE — Assessment & Plan Note (Signed)
Diminished after stopping zestril, no neurologic deficits on exam

## 2021-01-22 NOTE — Assessment & Plan Note (Signed)
Increasingly disabling with limping in the past 2 months, refer to ortho

## 2021-01-22 NOTE — Patient Instructions (Addendum)
F/U July 8 or shortly after , re valk blood pressure and also glyco HB  In office  For blood pressure , new higher dose of clonidine 0.3 mg is sent to your local pharmacy, start this evening. Stop clonidine 0.2 mg   Continue benazepril 5 mg one every evening  You are referred to Orthopedics re left heel  Thanks for choosing [facility], we consider it a privelige to serve you.

## 2021-01-22 NOTE — Progress Notes (Signed)
Jillian [name]     MRN: [id number]      DOB: 02/07/1941   HPI Jillian [name] is here for follow up and re-evaluation of chronic medical conditions, medication management and review of any available recent lab and radiology data.  Preventive health is updated, specifically  Cancer screening and Immunization.   right knee pain and stiffnes 03/09, resolved with injection Left heel pain with limping x 2 months, needs ortho eval  C/o headache with zestril, she stopped it and headache  Has gone, she already has benazepril taking  ROS Denies recent fever or chills. Denies sinus pressure, nasal congestion, ear pain or sore throat. Denies chest congestion, productive cough or wheezing. Denies chest pains, palpitations and leg swelling Denies abdominal pain, nausea, vomiting,diarrhea or constipation.   Denies dysuria, frequency, hesitancy or incontinence. . Denies depression, anxiety or insomnia. Denies skin break down or rash.   PE  BP (!) 150/80   Pulse 96   Resp 16   Ht 5\' 4"  (1.626 m)   Wt 189 lb 6.4 oz (85.9 kg)   SpO2 96%   BMI 32.51 kg/m    Patient alert and oriented and in no cardiopulmonary distress.  HEENT: No facial asymmetry, EOMI,     Neck supple .  Chest: Clear to auscultation bilaterally.  CVS: S1, S2 no murmurs, no S3.Regular rate.  ABD: Soft non tender.   Ext: No edema  MS: Adequate ROM spine, shoulders, hips and knees.Tender over left Chiles tendon  Skin: Intact, no ulcerations or rash noted.  Psych: Good eye contact, normal affect. Memory intact not anxious or depressed appearing.  CNS: CN 2-12 intact, power,  normal throughout.no focal deficits noted.   Assessment & Plan  ESSENTIAL HYPERTENSION, BENIGN Uncontrolled, inc clonidine to 0.3 mg one at bedtime, continue benazepril as before  DASH diet and commitment to daily physical activity for a minimum of 30 minutes discussed and encouraged, as a part of hypertension management. The importance of  attaining a healthy weight is also discussed.  BP/Weight 01/22/2021 11/26/2020 08/20/2020 08/01/2020 07/13/2020 07/12/2020 27/02/2375  Systolic BP 283 151 761 607 371 062 694  Diastolic BP 80 78 82 68 75 75 80  Wt. (Lbs) 189.4 188 184 180 - 181 183  BMI 32.51 32.27 31.58 30.9 - 31.07 31.41       Chronic headache Diminished after stopping zestril, no neurologic deficits on exam  Chronic heel pain, left Increasingly disabling with limping in the past 2 months, refer to ortho  Obese  Patient re-educated about  the importance of commitment to a  minimum of 150 minutes of exercise per week as able.  The importance of healthy food choices with portion control discussed, as well as eating regularly and within a 12 hour window most days. The need to choose "clean , green" food 50 to 75% of the time is discussed, as well as to make water the primary drink and set a goal of 64 ounces water daily.    Weight /BMI 01/22/2021 11/26/2020 08/20/2020  WEIGHT 189 lb 6.4 oz 188 lb 184 lb  HEIGHT 5\' 4"  5\' 4"  5\' 4"   BMI 32.51 kg/m2 32.27 kg/m2 31.58 kg/m2      Hyperlipidemia with target LDL less than 100 Hyperlipidemia:Low fat diet discussed and encouraged.   Lipid Panel  Lab Results  Component Value Date   CHOL 150 11/26/2020   HDL 52 11/26/2020   LDLCALC 79 11/26/2020   TRIG 105 11/26/2020   CHOLHDL 2.9 07/03/2020

## 2021-01-26 ENCOUNTER — Ambulatory Visit
Admission: EM | Admit: 2021-01-26 | Discharge: 2021-01-26 | Disposition: A | Discharge status: Home / Self Care | Payer: Medicare HMO | Attending: Family Medicine | Admitting: Family Medicine

## 2021-01-26 ENCOUNTER — Encounter: Payer: Self-pay | Admitting: Emergency Medicine

## 2021-01-26 DIAGNOSIS — M5442 Lumbago with sciatica, left side: Secondary | ICD-10-CM

## 2021-01-26 MED ORDER — DEXAMETHASONE SODIUM PHOSPHATE 10 MG/ML IJ SOLN
10.0000 mg | Freq: Once | INTRAMUSCULAR | Status: AC
  Administered 2021-01-26: 10 mg via INTRAMUSCULAR

## 2021-01-26 MED ORDER — TIZANIDINE HCL 2 MG PO CAPS: 2.0000 mg | ORAL_CAPSULE | Freq: Three times a day (TID) | ORAL | 0 refills | Status: DC

## 2021-01-26 NOTE — ED Provider Notes (Signed)
[facility]    CSN: [id number] Arrival date & time: 01/26/21  1242      History   Chief Complaint No chief complaint on file.   HPI Jillian [name] is a 80 y.o. female.   HPI  Patient is here for evaluation of left sided back pain with radiation into the left hip. No history of injury. History of chronic degenerative disease of joints and chronic pain. Denies focal neurological symptoms. Symptoms present x few days.  Past Medical History:  Diagnosis Date  . Atypical ductal hyperplasia of breast   . Chronic fatigue   . Diabetes mellitus 2011  . DJD (degenerative joint disease)    of neck    . Dyslipidemia   . 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 Hypertension 2005  . Mild obesity   . Postmenopausal   . Rectal bleeding   . Viral meningitis   . Wears glasses     Patient Active Problem List   Diagnosis Date Noted  . Chronic heel pain, left 01/22/2021  . Epigastric pain 08/05/2020  . Left hand pain 08/01/2020  . Allergic reaction to drug 07/12/2020  . Patellofemoral arthralgia of both knees 12/07/2019  . Diverticulitis of colon 06/14/2019  . Pancreatic lesion 06/14/2019  . Chronic headache 04/03/2019  . Obese 02/06/2019  . Thoracic back pain 11/08/2018  . 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 Thrombocytopenia ([place]) 06/11/2018  . 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 Back pain with right-sided radiculopathy 04/28/2018  . Longitudinal split nail 02/15/2018  . Closed fracture of phalanx of foot 11/28/2017  . Impingement syndrome of left shoulder region 11/09/2017  . Chronic left SI joint pain 05/21/2016  . Primary osteoarthritis of both knees 06/26/2015  . 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 Thoracic spine pain 09/28/2014  . Shoulder pain, right 01/13/2014  . Hip pain 08/06/2012  . Chest wall pain 12/17/2011  . Cervical neck pain with evidence of disc disease  12/17/2011  . Hot flashes, menopausal 09/10/2011  . 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 INSOMNIA, CHRONIC 11/28/2009  . Hyperlipidemia with target LDL less than 100 10/12/2007  . ESSENTIAL HYPERTENSION, BENIGN 10/12/2007    Past Surgical History:  Procedure Laterality Date  . ABDOMINAL HYSTERECTOMY  1992   fibroids  . APPENDECTOMY    . BREAST EXCISIONAL BIOPSY Right    benign  . BREAST LUMPECTOMY WITH RADIOACTIVE SEED LOCALIZATION Right 10/05/2014   Procedure: BREAST LUMPECTOMY WITH RADIOACTIVE SEED LOCALIZATION;  Surgeon: Erroll Luna, MD;  Location: [place];  Service: General;  Laterality: Right;  . cataract Bilateral   . COLONOSCOPY  2013   Dr. Oneida Alar :  internal hemorrhoids, mild left-sided diverticulosis  . COLONOSCOPY N/A 09/21/2019   Procedure: COLONOSCOPY;  Surgeon: Danie Binder, MD;  [facility];  Service: Endoscopy;  Laterality: N/A;  12:00pm  . CYST EXCISION Right    arm  . ESOPHAGOGASTRODUODENOSCOPY  2013   Dr. Oneida Alar: gastritis likely NSAID-induced. Duodenal polyp. Duodenitis and gastritis in setting of aspirin and Ibuprofen.   Marland Kitchen EYE SURGERY Right 06/07/2013   cataract  . EYE SURGERY Left 09/14/2013   cataract  . KNEE ARTHROSCOPY Right 02/26/2016   Procedure: ARTHROSCOPY RIGHT KNEE WITH MENSICAL DEBRIDEMENT;  Surgeon: Gaynelle Arabian, MD;  Location: [facility];  Service: Orthopedics;  Laterality: Right;  . left breast biopsy    .  LESION REMOVAL Right 03/30/2013   Procedure: EXCISION OF NEOPLASM ARM;  Surgeon: Jamesetta So, MD;  Location: [facility];  Service: General;  Laterality: Right;  . TUBAL LIGATION      OB History    Gravida  2   Para  2   Term  2   Preterm      AB      Living        SAB      IAB      Ectopic      Multiple      Live Births               Home Medications    Prior to Admission medications   Medication Sig Start Date End Date Taking? Authorizing Provider   acetaminophen (TYLENOL) 500 MG tablet Take 1,000 mg by mouth every 6 (six) hours as needed for mild pain.    [provider]  aspirin EC 81 MG tablet Take 1 tablet (81 mg total) by mouth every evening. Patient taking differently: Take 81 mg by mouth at bedtime. 04/23/16   Fayrene Helper, MD  benazepril (LOTENSIN) 5 MG tablet Take 1 tablet (5 mg total) by mouth daily. 01/22/21   Fayrene Helper, MD  cloNIDine (CATAPRES) 0.3 MG tablet Take one tablet by mouth every evening at 8 pm 01/22/21   Fayrene Helper, MD  cyclobenzaprine (FLEXERIL) 10 MG tablet TAKE 1 TABLET BY MOUTH AT BEDTIME AS NEEDED FOR BACK SPASM Patient taking differently: Take 10 mg by mouth at bedtime as needed for muscle spasms. 05/23/19   Fayrene Helper, MD  famotidine (PEPCID) 40 MG tablet TAKE 1 TABLET(40 MG) BY MOUTH DAILY 08/01/20   Fayrene Helper, MD  ferrous sulfate 325 (65 FE) MG EC tablet TAKE 1 TABLET EVERY DAY 11/01/19   Lockamy, Randi L, NP-C  glucose blood test strip Use as instructed to check blood glucose once daily 09/08/19   Fayrene Helper, MD  hydrOXYzine (ATARAX/VISTARIL) 25 MG tablet Take 1 tablet (25 mg total) by mouth every 6 (six) hours as needed for itching. 07/13/20   Emerson Monte, FNP  metFORMIN (GLUCOPHAGE-XR) 500 MG 24 hr tablet TAKE 1 TABLET TWICE DAILY 06/05/20   Fayrene Helper, MD  pravastatin (PRAVACHOL) 10 MG tablet TAKE 1 TABLET EVERY DAY 08/13/20   Fayrene Helper, MD  vitamin B-12 (CYANOCOBALAMIN) 1000 MCG tablet Take 1,000 mcg by mouth every Monday, Wednesday, and Friday.    [provider]    Family History Family History  Problem Relation Age of Onset  . Hypertension Father   . Congestive Heart Failure Father   . Liver cancer Mother   . Hypertension Sister   . Hypertension Brother   . Diabetes Brother   . Colon cancer Neg Hx   . Colon polyps Neg Hx     Social History Social History   Tobacco Use  . Smoking status: Never Smoker   . Smokeless tobacco: Never Used  Vaping Use  . Vaping Use: Never used  Substance Use Topics  . Alcohol use: No  . Drug use: No     Allergies   Ciprofloxacin, Metronidazole, Codeine, and Propoxyphene n-acetaminophen   Review of Systems Review of Systems Pertinent negatives listed in HPI  Physical Exam Triage Vital Signs ED Triage Vitals  Enc Vitals Group     BP 01/26/21 1400 (!) 152/81     Pulse Rate 01/26/21 1400 80  Resp 01/26/21 1400 18     Temp 01/26/21 1400 98.1 F (36.7 C)     Temp Source 01/26/21 1400 Oral     SpO2 01/26/21 1400 95 %     Weight --      Height --      Head Circumference --      Peak Flow --      Pain Score 01/26/21 1404 8     Pain Loc --      Pain Edu? --      Excl. in [place]? --    No data found.  Updated Vital Signs BP (!) 152/81   Pulse 80   Temp 98.1 F (36.7 C) (Oral)   Resp 18   SpO2 95%   Visual Acuity Right Eye Distance:   Left Eye Distance:   Bilateral Distance:    Right Eye Near:   Left Eye Near:    Bilateral Near:     Physical Exam General appearance: alert, well developed, well nourished, cooperative and in no distress Head: Normocephalic, without obvious abnormality, atraumatic Respiratory: Respirations even and unlabored, normal respiratory rate Heart: rate and rhythm normal. No gallop or murmurs noted on exam  Lumbar spine: Spinous process midline intact  + SLR, antalgic gait,  No visible gross deformities Skin: Skin color, texture, turgor normal. No rashes seen  Psych: Appropriate mood and affect.  UC Treatments / Results  Labs (all labs ordered are listed, but only abnormal results are displayed) Labs Reviewed - No data to display  EKG   Radiology No results found.  Procedures Procedures (including critical care time)  Medications Ordered in UC Medications  dexamethasone (DECADRON) injection 10 mg (10 mg Intramuscular Given 01/26/21 1438)    Initial Impression / Assessment and Plan / UC Course   I have reviewed the triage vital signs and the nursing notes.  Pertinent labs & imaging results that were available during my care of the patient were reviewed by me and considered in my medical decision making (see chart for details).    Acute left side back pain with sciatica  Decadron 10 mg IM for inflammation tizaindine PRN for pain. Follow-up with PCP or Orthopedics as needed. Final Clinical Impressions(s) / UC Diagnoses   Final diagnoses:  Acute left-sided low back pain with left-sided sciatica   Discharge Instructions   None    ED Prescriptions    Medication Sig Dispense Auth. Provider   tizanidine (ZANAFLEX) 2 MG capsule Take 1-2 capsules (2-4 mg total) by mouth 3 (three) times daily. 30 capsule Scot Jun, FNP     PDMP not reviewed this encounter.   Scot Jun, FNP 01/28/21 [phone]

## 2021-01-26 NOTE — ED Triage Notes (Signed)
Pain to LT lower back that goes down LT leg x 3 days

## 2021-01-29 ENCOUNTER — Ambulatory Visit (INDEPENDENT_AMBULATORY_CARE_PROVIDER_SITE_OTHER): Payer: Medicare HMO | Admitting: Internal Medicine

## 2021-01-29 ENCOUNTER — Encounter: Payer: Self-pay | Admitting: Internal Medicine

## 2021-01-29 ENCOUNTER — Other Ambulatory Visit: Payer: Self-pay

## 2021-01-29 VITALS — BP 153/86 | HR 94 | Temp 98.4°F | Resp 18 | Ht 64.0 in | Wt 191.0 lb

## 2021-01-29 DIAGNOSIS — Z09 Encounter for follow-up examination after completed treatment for conditions other than malignant neoplasm: Secondary | ICD-10-CM

## 2021-01-29 DIAGNOSIS — M533 Sacrococcygeal disorders, not elsewhere classified: Secondary | ICD-10-CM | POA: Diagnosis not present

## 2021-01-29 DIAGNOSIS — G8929 Other chronic pain: Secondary | ICD-10-CM

## 2021-01-29 MED ORDER — KETOROLAC TROMETHAMINE 60 MG/2ML IM SOLN
60.0000 mg | Freq: Once | INTRAMUSCULAR | Status: AC
  Administered 2021-01-29: 60 mg via INTRAMUSCULAR

## 2021-01-29 MED ORDER — PREDNISONE 10 MG (21) PO TBPK: ORAL_TABLET | ORAL | 0 refills | Status: DC

## 2021-01-29 NOTE — Progress Notes (Signed)
Acute Office Visit  Subjective:    Patient ID: Jillian [name], female    DOB: [birth year], 80 y.o.   MRN: [id number]  Chief Complaint  Patient presents with  . Hip Pain    Pt having left hip pain down her left leg has been going on for 5 days went to urgent care on Saturday they told her it was a nerve and gave a shot this didn't help     HPI Patient is in today for evaluation of chronic low back pain, especially on left side. Pain is worse for last 5 days, constant, radiating to left hip and LE. Denies any numbness or tingling of the LE. She has been having difficulty walking due to pain. Denies any recent fall or injury. Denies any recent heavy lifting. She went to Urgent care and was given Decadron 10 mg followed by Zanaflex, which did not help with the pain.  Past Medical History:  Diagnosis Date  . Atypical ductal hyperplasia of breast   . Chronic fatigue   . Diabetes mellitus 2011  . DJD (degenerative joint disease)    of neck    . Dyslipidemia   . 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 Hypertension 2005  . Mild obesity   . Postmenopausal   . Rectal bleeding   . Viral meningitis   . Wears glasses     Past Surgical History:  Procedure Laterality Date  . ABDOMINAL HYSTERECTOMY  1992   fibroids  . APPENDECTOMY    . BREAST EXCISIONAL BIOPSY Right    benign  . BREAST LUMPECTOMY WITH RADIOACTIVE SEED LOCALIZATION Right 10/05/2014   Procedure: BREAST LUMPECTOMY WITH RADIOACTIVE SEED LOCALIZATION;  Surgeon: Erroll Luna, MD;  Location: [place];  Service: General;  Laterality: Right;  . cataract Bilateral   . COLONOSCOPY  2013   Dr. Oneida Alar :  internal hemorrhoids, mild left-sided diverticulosis  . COLONOSCOPY N/A 09/21/2019   Procedure: COLONOSCOPY;  Surgeon: Danie Binder, MD;  [facility];  Service: Endoscopy;  Laterality: N/A;  12:00pm  . CYST EXCISION Right    arm  .  ESOPHAGOGASTRODUODENOSCOPY  2013   Dr. Oneida Alar: gastritis likely NSAID-induced. Duodenal polyp. Duodenitis and gastritis in setting of aspirin and Ibuprofen.   Marland Kitchen EYE SURGERY Right 06/07/2013   cataract  . EYE SURGERY Left 09/14/2013   cataract  . KNEE ARTHROSCOPY Right 02/26/2016   Procedure: ARTHROSCOPY RIGHT KNEE WITH MENSICAL DEBRIDEMENT;  Surgeon: Gaynelle Arabian, MD;  Location[facility];  Service: Orthopedics;  Laterality: Right;  . left breast biopsy    . LESION REMOVAL Right 03/30/2013   Procedure: EXCISION OF NEOPLASM ARM;  Surgeon: Jamesetta So, MD;  Location: [facility];  Service: General;  Laterality: Right;  . TUBAL LIGATION      Family History  Problem Relation Age of Onset  . Hypertension Father   . Congestive Heart Failure Father   . Liver cancer Mother   . Hypertension Sister   . Hypertension Brother   . Diabetes Brother   . Colon cancer Neg Hx   . Colon polyps Neg Hx     Social History   Socioeconomic History  . Marital status: Widowed    Spouse name: Not on file  . Number of children: Not on file  . Years of education: Not on file  . Highest education level: Not on file  Occupational History  .  Not on file  Tobacco Use  . Smoking status: Never Smoker  . Smokeless tobacco: Never Used  Vaping Use  . Vaping Use: Never used  Substance and Sexual Activity  . Alcohol use: No  . Drug use: No  . Sexual activity: Not Currently    Birth control/protection: Surgical  Other Topics Concern  . Not on file  Social History Narrative  . Not on file   Social Determinants of Health   Financial Resource Strain: Low Risk   . Difficulty of Paying Living Expenses: Not hard at all  Food Insecurity: No Food Insecurity  . Worried About Charity fundraiser in the Last Year: Never true  . Ran Out of Food in the Last Year: Never true  Transportation Needs: No Transportation Needs  . Lack of Transportation (Medical): No  . Lack of Transportation (Non-Medical): No  Physical  Activity: Inactive  . Days of Exercise per Week: 0 days  . Minutes of Exercise per Session: 0 min  Stress: No Stress Concern Present  . Feeling of Stress : Only a little  Social Connections: Moderately Integrated  . Frequency of Communication with Friends and Family: More than three times a week  . Frequency of Social Gatherings with Friends and Family: More than three times a week  . Attends Religious Services: 1 to 4 times per year  . Active Member of Clubs or Organizations: Yes  . Attends Archivist Meetings: More than 4 times per year  . Marital Status: Widowed  Intimate Partner Violence: Not on file    Outpatient Medications Prior to Visit  Medication Sig Dispense Refill  . acetaminophen (TYLENOL) 500 MG tablet Take 1,000 mg by mouth every 6 (six) hours as needed for mild pain.    Marland Kitchen aspirin EC 81 MG tablet Take 1 tablet (81 mg total) by mouth every evening. (Patient taking differently: Take 81 mg by mouth at bedtime.) 90 tablet 1  . benazepril (LOTENSIN) 5 MG tablet Take 1 tablet (5 mg total) by mouth daily. 90 tablet 3  . cloNIDine (CATAPRES) 0.3 MG tablet Take one tablet by mouth every evening at 8 pm 30 tablet 2  . famotidine (PEPCID) 40 MG tablet TAKE 1 TABLET(40 MG) BY MOUTH DAILY 90 tablet 0  . ferrous sulfate 325 (65 FE) MG EC tablet TAKE 1 TABLET EVERY DAY 90 tablet 2  . glucose blood test strip Use as instructed to check blood glucose once daily 100 each 0  . hydrOXYzine (ATARAX/VISTARIL) 25 MG tablet Take 1 tablet (25 mg total) by mouth every 6 (six) hours as needed for itching. 30 tablet 0  . metFORMIN (GLUCOPHAGE-XR) 500 MG 24 hr tablet TAKE 1 TABLET TWICE DAILY 180 tablet 0  . pravastatin (PRAVACHOL) 10 MG tablet TAKE 1 TABLET EVERY DAY 90 tablet 0  . tizanidine (ZANAFLEX) 2 MG capsule Take 1-2 capsules (2-4 mg total) by mouth 3 (three) times daily. 30 capsule 0  . vitamin B-12 (CYANOCOBALAMIN) 1000 MCG tablet Take 1,000 mcg by mouth every Monday, Wednesday,  and Friday.     No facility-administered medications prior to visit.    Allergies  Allergen Reactions  . Ciprofloxacin Itching  . Metronidazole Itching  . Codeine Nausea And Vomiting  . Propoxyphene   . Propoxyphene N-Acetaminophen Other (See Comments)    hallucinations    Review of Systems  Constitutional: Negative for chills and fever.  Respiratory: Negative for cough and shortness of breath.   Cardiovascular: Negative for chest pain  and palpitations.  Gastrointestinal: Negative for constipation and diarrhea.  Genitourinary: Negative for dysuria and hematuria.  Musculoskeletal: Positive for arthralgias, back pain and gait problem.  Skin: Negative for rash.  Neurological: Negative for dizziness and weakness.       Objective:    Physical Exam Vitals reviewed.  Constitutional:      General: She is not in acute distress.    Appearance: She is not diaphoretic.  HENT:     Head: Normocephalic and atraumatic.  Eyes:     General: No scleral icterus.    Extraocular Movements: Extraocular movements intact.  Cardiovascular:     Rate and Rhythm: Normal rate and regular rhythm.     Pulses: Normal pulses.     Heart sounds: Normal heart sounds. No murmur heard.   Pulmonary:     Breath sounds: Normal breath sounds. No wheezing or rales.  Musculoskeletal:        General: Tenderness (Left lumbar paraspinal and SI joint) present.     Cervical back: Neck supple. No tenderness.     Right lower leg: No edema.     Left lower leg: No edema.  Skin:    General: Skin is warm.     Findings: No rash.  Neurological:     General: No focal deficit present.     Mental Status: She is alert and oriented to person, place, and time.     Gait: Gait abnormal.  Psychiatric:        Mood and Affect: Mood normal.        Behavior: Behavior normal.     BP (!) 153/86 (BP Location: Left Arm, Patient Position: Sitting, Cuff Size: Normal)   Pulse 94   Temp 98.4 F (36.9 C) (Oral)   Resp 18   Ht  _0  (1.626 m)   Wt 191 lb (86.6 kg)   SpO2 98%   BMI 32.79 kg/m  Wt Readings from Last 3 Encounters:  01/29/21 191 lb (86.6 kg)  01/22/21 189 lb 6.4 oz (85.9 kg)  11/26/20 188 lb (85.3 kg)    Health Maintenance Due  Topic Date Due  . COVID-19 Vaccine (3 - Moderna risk 4-dose series) 11/28/2019    There are no preventive care reminders to display for this patient.   Lab Results  Component Value Date   TSH 1.29 01/30/2020   Lab Results  Component Value Date   WBC 6.1 11/26/2020   HGB 12.4 11/26/2020   HCT 37.5 11/26/2020   MCV 91 11/26/2020   PLT 188 11/26/2020   Lab Results  Component Value Date   NA 144 11/26/2020   K 4.0 11/26/2020   CO2 21 11/26/2020   GLUCOSE 106 (H) 11/26/2020   BUN 11 11/26/2020   CREATININE 1.03 (H) 11/26/2020   BILITOT 0.4 11/26/2020   ALKPHOS 83 11/26/2020   AST 14 11/26/2020   ALT 11 11/26/2020   PROT 6.9 11/26/2020   ALBUMIN 4.0 11/26/2020   CALCIUM 9.2 11/26/2020   ANIONGAP 8 05/07/2020   EGFR 56 (L) 11/26/2020   Lab Results  Component Value Date   CHOL 150 11/26/2020   Lab Results  Component Value Date   HDL 52 11/26/2020   Lab Results  Component Value Date   LDLCALC 79 11/26/2020   Lab Results  Component Value Date   TRIG 105 11/26/2020   Lab Results  Component Value Date   CHOLHDL 2.9 07/03/2020   Lab Results  Component Value Date   HGBA1C 6.7 (H)  11/26/2020       Assessment & Plan:   Problem List Items Addressed This Visit      Other   Chronic left SI joint pain - Primary S/p Decadron 10 mg IM in Urgent care Nontraumatic, chart review suggests h/o SI joint pain Toradol IM in the office today Prednisone taper for joint inflammation Referred to Orthopedic surgeon for possible intraarticular injection    Relevant Medications   predniSONE (STERAPRED UNI-PAK 21 TAB) 10 MG (21) TBPK tablet   Other Relevant Orders   Ambulatory referral to Orthopedic Surgery     Urgent care/ER visit follow up Chart  reviewed from visit Zanaflex PRN for muscle spasms   Meds ordered this encounter  Medications  . predniSONE (STERAPRED UNI-PAK 21 TAB) 10 MG (21) TBPK tablet    Sig: Take as package instructions.    Dispense:  1 each    Refill:  0     Kaedynce Tapp Keith Rake, MD

## 2021-01-29 NOTE — Patient Instructions (Signed)
Chronic Back Pain When back pain lasts longer than 3 months, it is called chronic back pain. Pain may get worse at certain times (flare-ups). There are things you can do at home to manage your pain. Follow these instructions at home: Pay attention to any changes in your symptoms. Take these actions to help with your pain: Managing pain and stiffness  If told, put ice on the painful area. Your doctor may tell you to use ice for 24-48 hours after the flare-up starts. To do this: ? Put ice in a plastic bag. ? Place a towel between your skin and the bag. ? Leave the ice on for 20 minutes, 2-3 times a day.  If told, put heat on the painful area. Do this as often as told by your doctor. Use the heat source that your doctor recommends, such as a moist heat pack or a heating pad. ? Place a towel between your skin and the heat source. ? Leave the heat on for 20-30 minutes. ? Take off the heat if your skin turns bright red. This is especially important if you are unable to feel pain, heat, or cold. You may have a greater risk of getting burned.  Soak in a warm bath. This can help relieve pain.      Activity  Avoid bending and other activities that make pain worse.  When standing: ? Keep your upper back and neck straight. ? Keep your shoulders pulled back. ? Avoid slouching.  When sitting: ? Keep your back straight. ? Relax your shoulders. Do not round your shoulders or pull them backward.  Do not sit or stand in one place for long periods of time.  Take short rest breaks during the day. Lying down or standing is usually better than sitting. Resting can help relieve pain.  When sitting or lying down for a long time, do some mild activity or stretching. This will help to prevent stiffness and pain.  Get regular exercise. Ask your doctor what activities are safe for you.  Do not lift anything that is heavier than 10 lb (4.5 kg) or the limit that you are told, until your doctor says that it  is safe.  To prevent injury when you lift things: ? Bend your knees. ? Keep the weight close to your body. ? Avoid twisting.  Sleep on a firm mattress. Try lying on your side with your knees slightly bent. If you lie on your back, put a pillow under your knees.   Medicines  Treatment may include medicines for pain and swelling taken by mouth or put on the skin, prescription pain medicine, or muscle relaxants.  Take over-the-counter and prescription medicines only as told by your doctor.  Ask your doctor if the medicine prescribed to you: ? Requires you to avoid driving or using machinery. ? Can cause trouble pooping (constipation). You may need to take these actions to prevent or treat trouble pooping:  Drink enough fluid to keep your pee (urine) pale yellow.  Take over-the-counter or prescription medicines.  Eat foods that are high in fiber. These include beans, whole grains, and fresh fruits and vegetables.  Limit foods that are high in fat and sugars. These include fried or sweet foods. General instructions  Do not use any products that contain nicotine or tobacco, such as cigarettes, e-cigarettes, and chewing tobacco. If you need help quitting, ask your doctor.  Keep all follow-up visits as told by your doctor. This is important. Contact a doctor if:    Your pain does not get better with rest or medicine.  Your pain gets worse, or you have new pain.  You have a high fever.  You lose weight very quickly.  You have trouble doing your normal activities. Get help right away if:  One or both of your legs or feet feel weak.  One or both of your legs or feet lose feeling (have numbness).  You have trouble controlling when you poop (have a bowel movement) or pee (urinate).  You have bad back pain and: ? You feel like you may vomit (nauseous), or you vomit. ? You have pain in your belly (abdomen). ? You have shortness of breath. ? You faint. Summary  When back pain  lasts longer than 3 months, it is called chronic back pain.  Pain may get worse at certain times (flare-ups).  Use ice and heat as told by your doctor. Your doctor may tell you to use ice after flare-ups. This information is not intended to replace advice given to you by your health care provider. Make sure you discuss any questions you have with your health care provider. Document Revised: 10/19/2019 Document Reviewed: 10/19/2019 Elsevier Patient Education  2021 Elsevier Inc.  

## 2021-01-30 ENCOUNTER — Other Ambulatory Visit: Payer: Self-pay | Admitting: Family Medicine

## 2021-02-05 ENCOUNTER — Other Ambulatory Visit (HOSPITAL_COMMUNITY): Payer: Self-pay | Admitting: *Deleted

## 2021-02-05 DIAGNOSIS — E559 Vitamin D deficiency, unspecified: Secondary | ICD-10-CM

## 2021-02-05 DIAGNOSIS — D649 Anemia, unspecified: Secondary | ICD-10-CM

## 2021-02-05 DIAGNOSIS — E538 Deficiency of other specified B group vitamins: Secondary | ICD-10-CM

## 2021-02-06 ENCOUNTER — Other Ambulatory Visit (HOSPITAL_COMMUNITY)
Admission: RE | Admit: 2021-02-06 | Discharge: 2021-02-06 | Disposition: A | Discharge status: Home / Self Care | Payer: Medicare HMO | Source: Ambulatory Visit | Attending: Nurse Practitioner | Admitting: Nurse Practitioner

## 2021-02-06 ENCOUNTER — Other Ambulatory Visit: Payer: Self-pay

## 2021-02-06 ENCOUNTER — Inpatient Hospital Stay (HOSPITAL_COMMUNITY): Payer: Medicare HMO | Attending: Family Medicine

## 2021-02-06 DIAGNOSIS — R61 Generalized hyperhidrosis: Secondary | ICD-10-CM | POA: Insufficient documentation

## 2021-02-06 DIAGNOSIS — Z1231 Encounter for screening mammogram for malignant neoplasm of breast: Secondary | ICD-10-CM | POA: Diagnosis not present

## 2021-02-06 DIAGNOSIS — D241 Benign neoplasm of right breast: Secondary | ICD-10-CM | POA: Insufficient documentation

## 2021-02-06 DIAGNOSIS — E538 Deficiency of other specified B group vitamins: Secondary | ICD-10-CM | POA: Insufficient documentation

## 2021-02-06 DIAGNOSIS — N6091 Unspecified benign mammary dysplasia of right breast: Secondary | ICD-10-CM | POA: Diagnosis not present

## 2021-02-06 DIAGNOSIS — D649 Anemia, unspecified: Secondary | ICD-10-CM

## 2021-02-06 DIAGNOSIS — E559 Vitamin D deficiency, unspecified: Secondary | ICD-10-CM | POA: Diagnosis not present

## 2021-02-06 LAB — CBC WITH DIFFERENTIAL/PLATELET
Abs Immature Granulocytes: 0.1 10*3/uL — ABNORMAL HIGH (ref 0.00–0.07)
Basophils Absolute: 0 10*3/uL (ref 0.0–0.1)
Basophils Relative: 0 %
Eosinophils Absolute: 0 10*3/uL (ref 0.0–0.5)
Eosinophils Relative: 0 %
HCT: 40.8 % (ref 36.0–46.0)
Hemoglobin: 12.7 g/dL (ref 12.0–15.0)
Immature Granulocytes: 1 %
Lymphocytes Relative: 37 %
Lymphs Abs: 5.4 10*3/uL — ABNORMAL HIGH (ref 0.7–4.0)
MCH: 30.8 pg (ref 26.0–34.0)
MCHC: 31.1 g/dL (ref 30.0–36.0)
MCV: 98.8 fL (ref 80.0–100.0)
Monocytes Absolute: 0.9 10*3/uL (ref 0.1–1.0)
Monocytes Relative: 6 %
Neutro Abs: 8.1 10*3/uL — ABNORMAL HIGH (ref 1.7–7.7)
Neutrophils Relative %: 56 %
Platelets: 187 10*3/uL (ref 150–400)
RBC: 4.13 MIL/uL (ref 3.87–5.11)
RDW: 15.6 % — ABNORMAL HIGH (ref 11.5–15.5)
WBC: 14.5 10*3/uL — ABNORMAL HIGH (ref 4.0–10.5)
nRBC: 0 % (ref 0.0–0.2)

## 2021-02-06 LAB — COMPREHENSIVE METABOLIC PANEL
ALT: 19 U/L (ref 0–44)
AST: 16 U/L (ref 15–41)
Albumin: 3.6 g/dL (ref 3.5–5.0)
Alkaline Phosphatase: 69 U/L (ref 38–126)
Anion gap: 9 (ref 5–15)
BUN: 20 mg/dL (ref 8–23)
CO2: 27 mmol/L (ref 22–32)
Calcium: 8.7 mg/dL — ABNORMAL LOW (ref 8.9–10.3)
Chloride: 103 mmol/L (ref 98–111)
Creatinine, Ser: 1 mg/dL (ref 0.44–1.00)
GFR, Estimated: 58 mL/min — ABNORMAL LOW (ref >60)
Glucose, Bld: 102 mg/dL — ABNORMAL HIGH (ref 70–99)
Potassium: 3.8 mmol/L (ref 3.5–5.1)
Sodium: 139 mmol/L (ref 135–145)
Total Bilirubin: 0.7 mg/dL (ref 0.3–1.2)
Total Protein: 7.1 g/dL (ref 6.5–8.1)

## 2021-02-06 LAB — FERRITIN: Ferritin: 91 ng/mL (ref 11–307)

## 2021-02-06 LAB — VITAMIN B12: Vitamin B-12: 912 pg/mL (ref 180–914)

## 2021-02-06 LAB — IRON AND TIBC
Iron: 152 ug/dL (ref 28–170)
Saturation Ratios: 48 % — ABNORMAL HIGH (ref 10.4–31.8)
TIBC: 317 ug/dL (ref 250–450)
UIBC: 165 ug/dL

## 2021-02-06 LAB — FOLATE: Folate: 9 ng/mL (ref >5.9)

## 2021-02-06 LAB — VITAMIN D 25 HYDROXY (VIT D DEFICIENCY, FRACTURES): Vit D, 25-Hydroxy: 17.85 ng/mL — ABNORMAL LOW (ref 30–100)

## 2021-02-12 ENCOUNTER — Ambulatory Visit (INDEPENDENT_AMBULATORY_CARE_PROVIDER_SITE_OTHER): Payer: Medicare HMO

## 2021-02-12 ENCOUNTER — Other Ambulatory Visit: Payer: Self-pay

## 2021-02-12 VITALS — BP 119/74 | Ht 64.0 in | Wt 191.0 lb

## 2021-02-12 DIAGNOSIS — Z0000 Encounter for general adult medical examination without abnormal findings: Secondary | ICD-10-CM | POA: Diagnosis not present

## 2021-02-12 NOTE — Progress Notes (Signed)
[facility] [address], [place] [address]   Patient Care Team: Kerri Perches, MD as PCP - General Fields, Darleene Cleaver, MD (Inactive) (Gastroenterology) Stacey Drain, MD as Consulting Physician (Rheumatology) Galen Manila, Novella Olive, MD (Inactive) as Consulting Physician (Hematology and Oncology)  SUMMARY OF ONCOLOGIC HISTORY: Oncology History   No history exists.    CHIEF COMPLIANT: Follow-up breast cancer   INTERVAL HISTORY: [name] is a 80 y.o. female here today for follow up of her breast cancer. Her last visit was on 05/08/2020.   Today she reports feeling good. She reports excellent energy levels. She denies any CP, night sweats, hot flashes, or lightheadedness. She is taking B-12, and she is no longer taking vitamin D.   REVIEW OF SYSTEMS:   Review of Systems  Constitutional: Positive for fatigue (75%). Negative for appetite change.  Cardiovascular: Negative for chest pain.  Endocrine: Negative for hot flashes.  Neurological: Negative for light-headedness.  Psychiatric/Behavioral: Positive for sleep disturbance.  All other systems reviewed and are negative.   I have reviewed the past medical history, past surgical history, social history and family history with the patient and they are unchanged from previous note.   ALLERGIES:   is allergic to ciprofloxacin, metronidazole, codeine, propoxyphene, and propoxyphene n-acetaminophen.   MEDICATIONS:  Current Outpatient Medications  Medication Sig Dispense Refill  . acetaminophen (TYLENOL) 500 MG tablet Take 1,000 mg by mouth every 6 (six) hours as needed for mild pain.    Marland Kitchen aspirin EC 81 MG tablet Take 1 tablet (81 mg total) by mouth every evening. (Patient taking differently: Take 81 mg by mouth at bedtime.) 90 tablet 1  . benazepril (LOTENSIN) 5 MG tablet Take 1 tablet (5 mg total) by mouth daily. 90 tablet 3  . Blood Glucose Monitoring Suppl (TRUE METRIX METER) w/Device KIT USE AS  DIRECTED 1 kit 0  . cloNIDine (CATAPRES) 0.3 MG tablet Take one tablet by mouth every evening at 8 pm 30 tablet 2  . famotidine (PEPCID) 40 MG tablet TAKE 1 TABLET(40 MG) BY MOUTH DAILY 90 tablet 0  . ferrous sulfate 325 (65 FE) MG EC tablet TAKE 1 TABLET EVERY DAY 90 tablet 2  . glucose blood test strip Use as instructed to check blood glucose once daily 100 each 0  . hydrOXYzine (ATARAX/VISTARIL) 25 MG tablet Take 1 tablet (25 mg total) by mouth every 6 (six) hours as needed for itching. 30 tablet 0  . metFORMIN (GLUCOPHAGE-XR) 500 MG 24 hr tablet TAKE 1 TABLET TWICE DAILY 180 tablet 0  . pravastatin (PRAVACHOL) 10 MG tablet TAKE 1 TABLET EVERY DAY 90 tablet 0  . tizanidine (ZANAFLEX) 2 MG capsule Take 1-2 capsules (2-4 mg total) by mouth 3 (three) times daily. 30 capsule 0  . vitamin B-12 (CYANOCOBALAMIN) 1000 MCG tablet Take 1,000 mcg by mouth every Monday, Wednesday, and Friday.     No current facility-administered medications for this visit.     PHYSICAL EXAMINATION: Performance status (ECOG): 1 - Symptomatic but completely ambulatory  There were no vitals filed for this visit. Wt Readings from Last 3 Encounters:  02/12/21 191 lb (86.6 kg)  01/29/21 191 lb (86.6 kg)  01/22/21 189 lb 6.4 oz (85.9 kg)   Physical Exam Vitals reviewed.  Constitutional:      Appearance: Normal appearance.  Cardiovascular:     Rate and Rhythm: Normal rate and regular rhythm.     Pulses: Normal pulses.     Heart  sounds: Normal heart sounds.  Pulmonary:     Effort: Pulmonary effort is normal.     Breath sounds: Normal breath sounds.  Abdominal:     Palpations: Abdomen is soft. There is no hepatomegaly, splenomegaly or mass.     Tenderness: There is no abdominal tenderness.  Musculoskeletal:     Right lower leg: No edema.     Left lower leg: No edema.  Neurological:     General: No focal deficit present.     Mental Status: She is alert and oriented to person, place, and time.  Psychiatric:         Mood and Affect: Mood normal.        Behavior: Behavior normal.     Breast Exam Chaperone: Thana Ates     LABORATORY DATA:  I have reviewed the data as listed CMP Latest Ref Rng & Units 02/06/2021 11/26/2020 07/09/2020  Glucose 70 - 99 mg/dL 102(H) 106(H) 106  BUN 8 - 23 mg/dL $Remove'20 11 11  'LiqSbER$ Creatinine 0.44 - 1.00 mg/dL 1.00 1.03(H) 0.90  Sodium 135 - 145 mmol/L 139 144 142  Potassium 3.5 - 5.1 mmol/L 3.8 4.0 4.0  Chloride 98 - 111 mmol/L 103 107(H) 108  CO2 22 - 32 mmol/L $RemoveB'27 21 24  'GlmJwPPu$ Calcium 8.9 - 10.3 mg/dL 8.7(L) 9.2 9.1  Total Protein 6.5 - 8.1 g/dL 7.1 6.9 6.5  Total Bilirubin 0.3 - 1.2 mg/dL 0.7 0.4 0.4  Alkaline Phos 38 - 126 U/L 69 83 -  AST 15 - 41 U/L $Remo'16 14 15  'PAoeg$ ALT 0 - 44 U/L $Remo'19 11 9   'ppjtn$ No results found for: HDQ222 Lab Results  Component Value Date   WBC 14.5 (H) 02/06/2021   HGB 12.7 02/06/2021   HCT 40.8 02/06/2021   MCV 98.8 02/06/2021   PLT 187 02/06/2021   NEUTROABS 8.1 (H) 02/06/2021    ASSESSMENT:  1.  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 - She took tamoxifen from 2016 through 2021.  2.  Vitamin B-12 deficiency: -She is on vitamin B12 tablet every other day.  3.  Night sweats: -She also had elevated white count, predominantly neutrophils. - Flow cytometry was normal.  BCR/ABL was negative.  Night sweats improved after she stopped tamoxifen.   PLAN:  1.  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 - Mammogram on 02/06/2021 was BI-RADS Category 1. - RTC 1 year with labs and mammogram.  2.  Vitamin D deficiency: - She stopped taking vitamin D weekly. - Recommend starting vitamin D 2000 units daily.     Breast Cancer therapy associated bone loss: I have recommended calcium, Vitamin D and weight bearing exercises.  Orders placed this encounter:  No orders of the defined types were  placed in this encounter.   The patient has a good understanding of the overall plan. She agrees with it. She will call with any problems that may develop before the next visit here.  Derek Jack, MD [place] [phone]   I, Thana Ates, am acting as a scribe for Dr. Derek Jack.  I, Derek Jack MD, have reviewed the above documentation for accuracy and completeness, and I agree with the above.

## 2021-02-12 NOTE — Patient Instructions (Addendum)
Jillian [name] , Thank you for taking time to come for your Medicare Wellness Visit. I appreciate your ongoing commitment to your health goals. Please review the following plan we discussed and let me know if I can assist you in the future.   Screening recommendations/referrals: Colonoscopy: No longer required. Mammogram: No longer required. Bone Density: Complete Recommended yearly ophthalmology/optometry visit for glaucoma screening and checkup Recommended yearly dental visit for hygiene and checkup  Vaccinations: Influenza vaccine: Fall 2022 Pneumococcal vaccine: Complete Tdap vaccine: Due Shingles vaccine: Complete   Advanced directives: POA and Living Will  Conditions/risks identified: None  Next appointment: 04/02/21 @ 8:20 am   Preventive Care 79 Years and Older, Female Preventive care refers to lifestyle choices and visits with your health care provider that can promote health and wellness. What does preventive care include?  A yearly physical exam. This is also called an annual well check.  Dental exams once or twice a year.  Routine eye exams. Ask your health care provider how often you should have your eyes checked.  Personal lifestyle choices, including:  Daily care of your teeth and gums.  Regular physical activity.  Eating a healthy diet.  Avoiding tobacco and drug use.  Limiting alcohol use.  Practicing safe sex.  Taking low-dose aspirin every day.  Taking vitamin and mineral supplements as recommended by your health care provider. What happens during an annual well check? The services and screenings done by your health care provider during your annual well check will depend on your age, overall health, lifestyle risk factors, and family history of disease. Counseling  Your health care provider may ask you questions about your:  Alcohol use.  Tobacco use.  Drug use.  Emotional well-being.  Home and relationship well-being.  Sexual  activity.  Eating habits.  History of falls.  Memory and ability to understand (cognition).  Work and work Statistician.  Reproductive health. Screening  You may have the following tests or measurements:  Height, weight, and BMI.  Blood pressure.  Lipid and cholesterol levels. These may be checked every 5 years, or more frequently if you are over 18 years old.  Skin check.  Lung cancer screening. You may have this screening every year starting at age 63 if you have a 30-pack-year history of smoking and currently smoke or have quit within the past 15 years.  Fecal occult blood test (FOBT) of the stool. You may have this test every year starting at age 17.  Flexible sigmoidoscopy or colonoscopy. You may have a sigmoidoscopy every 5 years or a colonoscopy every 10 years starting at age 76.  Hepatitis C blood test.  Hepatitis B blood test.  Sexually transmitted disease (STD) testing.  Diabetes screening. This is done by checking your blood sugar (glucose) after you have not eaten for a while (fasting). You may have this done every 1-3 years.  Bone density scan. This is done to screen for osteoporosis. You may have this done starting at age 68.  Mammogram. This may be done every 1-2 years. Talk to your health care provider about how often you should have regular mammograms. Talk with your health care provider about your test results, treatment options, and if necessary, the need for more tests. Vaccines  Your health care provider may recommend certain vaccines, such as:  Influenza vaccine. This is recommended every year.  Tetanus, diphtheria, and acellular pertussis (Tdap, Td) vaccine. You may need a Td booster every 10 years.  Zoster vaccine. You may need this  after age 57.  Pneumococcal 13-valent conjugate (PCV13) vaccine. One dose is recommended after age 36.  Pneumococcal polysaccharide (PPSV23) vaccine. One dose is recommended after age 44. Talk to your health care  provider about which screenings and vaccines you need and how often you need them. This information is not intended to replace advice given to you by your health care provider. Make sure you discuss any questions you have with your health care provider. Document Released: 10/05/2015 Document Revised: 05/28/2016 Document Reviewed: 07/10/2015 Elsevier Interactive Patient Education  2017 [place] Prevention in the Home Falls can cause injuries. They can happen to people of all ages. There are many things you can do to make your home safe and to help prevent falls. What can I do on the outside of my home?  Regularly fix the edges of walkways and driveways and fix any cracks.  Remove anything that might make you trip as you walk through a door, such as a raised step or threshold.  Trim any bushes or trees on the path to your home.  Use bright outdoor lighting.  Clear any walking paths of anything that might make someone trip, such as rocks or tools.  Regularly check to see if handrails are loose or broken. Make sure that both sides of any steps have handrails.  Any raised decks and porches should have guardrails on the edges.  Have any leaves, snow, or ice cleared regularly.  Use sand or salt on walking paths during winter.  Clean up any spills in your garage right away. This includes oil or grease spills. What can I do in the bathroom?  Use night lights.  Install grab bars by the toilet and in the tub and shower. Do not use towel bars as grab bars.  Use non-skid mats or decals in the tub or shower.  If you need to sit down in the shower, use a plastic, non-slip stool.  Keep the floor dry. Clean up any water that spills on the floor as soon as it happens.  Remove soap buildup in the tub or shower regularly.  Attach bath mats securely with double-sided non-slip rug tape.  Do not have throw rugs and other things on the floor that can make you trip. What can I do in  the bedroom?  Use night lights.  Make sure that you have a light by your bed that is easy to reach.  Do not use any sheets or blankets that are too big for your bed. They should not hang down onto the floor.  Have a firm chair that has side arms. You can use this for support while you get dressed.  Do not have throw rugs and other things on the floor that can make you trip. What can I do in the kitchen?  Clean up any spills right away.  Avoid walking on wet floors.  Keep items that you use a lot in easy-to-reach places.  If you need to reach something above you, use a strong step stool that has a grab bar.  Keep electrical cords out of the way.  Do not use floor polish or wax that makes floors slippery. If you must use wax, use non-skid floor wax.  Do not have throw rugs and other things on the floor that can make you trip. What can I do with my stairs?  Do not leave any items on the stairs.  Make sure that there are handrails on both sides of the  stairs and use them. Fix handrails that are broken or loose. Make sure that handrails are as long as the stairways.  Check any carpeting to make sure that it is firmly attached to the stairs. Fix any carpet that is loose or worn.  Avoid having throw rugs at the top or bottom of the stairs. If you do have throw rugs, attach them to the floor with carpet tape.  Make sure that you have a light switch at the top of the stairs and the bottom of the stairs. If you do not have them, ask someone to add them for you. What else can I do to help prevent falls?  Wear shoes that:  Do not have high heels.  Have rubber bottoms.  Are comfortable and fit you well.  Are closed at the toe. Do not wear sandals.  If you use a stepladder:  Make sure that it is fully opened. Do not climb a closed stepladder.  Make sure that both sides of the stepladder are locked into place.  Ask someone to hold it for you, if possible.  Clearly mark and  make sure that you can see:  Any grab bars or handrails.  First and last steps.  Where the edge of each step is.  Use tools that help you move around (mobility aids) if they are needed. These include:  Canes.  Walkers.  Scooters.  Crutches.  Turn on the lights when you go into a dark area. Replace any light bulbs as soon as they burn out.  Set up your furniture so you have a clear path. Avoid moving your furniture around.  If any of your floors are uneven, fix them.  If there are any pets around you, be aware of where they are.  Review your medicines with your doctor. Some medicines can make you feel dizzy. This can increase your chance of falling. Ask your doctor what other things that you can do to help prevent falls. This information is not intended to replace advice given to you by your health care provider. Make sure you discuss any questions you have with your health care provider. Document Released: 07/05/2009 Document Revised: 02/14/2016 Document Reviewed: 10/13/2014 Elsevier Interactive Patient Education  2017 [employer].

## 2021-02-12 NOTE — Progress Notes (Signed)
Subjective:   Jillian [name] is a 80 y.o. female who presents for Medicare Annual (Subsequent) preventive examination.  Review of Systems       Objective:    There were no vitals filed for this visit. There is no height or weight on file to calculate BMI.  Advanced Directives 05/08/2020 04/01/2020 11/09/2019 09/21/2019 08/09/2019 06/12/2019 02/07/2019  Does Patient Have a Medical Advance Directive? Yes Yes Yes Yes Yes Yes Yes  Type of Paramedic of [place];Living will Healthcare Power of Attorney Living will;Healthcare Power of [place] of [place];Living will Living will Living will;Healthcare Power of Attorney  Does patient want to make changes to medical advance directive? No - Patient declined - No - Patient declined - No - Patient declined - No - Patient declined  Copy of [place] in Chart? No - copy requested - No - copy requested - No - copy requested - No - copy requested  Would patient like information on creating a medical advance directive? No - Patient declined - - - No - Patient declined - -  Pre-existing out of facility DNR order (yellow form or pink MOST form) - - - - - - -    Current Medications (verified) Outpatient Encounter Medications as of 02/12/2021  Medication Sig  . acetaminophen (TYLENOL) 500 MG tablet Take 1,000 mg by mouth every 6 (six) hours as needed for mild pain.  Marland Kitchen aspirin EC 81 MG tablet Take 1 tablet (81 mg total) by mouth every evening. (Patient taking differently: Take 81 mg by mouth at bedtime.)  . benazepril (LOTENSIN) 5 MG tablet Take 1 tablet (5 mg total) by mouth daily.  . Blood Glucose Monitoring Suppl (TRUE METRIX METER) w/Device KIT USE AS DIRECTED  . cloNIDine (CATAPRES) 0.3 MG tablet Take one tablet by mouth every evening at 8 pm  . famotidine (PEPCID) 40 MG tablet TAKE 1 TABLET(40 MG) BY MOUTH DAILY  . ferrous sulfate 325 (65 FE) MG EC tablet TAKE 1  TABLET EVERY DAY  . glucose blood test strip Use as instructed to check blood glucose once daily  . hydrOXYzine (ATARAX/VISTARIL) 25 MG tablet Take 1 tablet (25 mg total) by mouth every 6 (six) hours as needed for itching.  . metFORMIN (GLUCOPHAGE-XR) 500 MG 24 hr tablet TAKE 1 TABLET TWICE DAILY  . pravastatin (PRAVACHOL) 10 MG tablet TAKE 1 TABLET EVERY DAY  . predniSONE (STERAPRED UNI-PAK 21 TAB) 10 MG (21) TBPK tablet Take as package instructions.  . tizanidine (ZANAFLEX) 2 MG capsule Take 1-2 capsules (2-4 mg total) by mouth 3 (three) times daily.  . vitamin B-12 (CYANOCOBALAMIN) 1000 MCG tablet Take 1,000 mcg by mouth every Monday, Wednesday, and Friday.   No facility-administered encounter medications on file as of 02/12/2021.    Allergies (verified) Ciprofloxacin, Metronidazole, Codeine, Propoxyphene, and Propoxyphene n-acetaminophen   History: Past Medical History:  Diagnosis Date  . Atypical ductal hyperplasia of breast   . Chronic fatigue   . Diabetes mellitus 2011  . DJD (degenerative joint disease)    of neck    . Dyslipidemia   . 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 Hypertension 2005  . Mild obesity   . Postmenopausal   . Rectal bleeding   . Viral meningitis   . Wears glasses    Past Surgical History:  Procedure Laterality Date  . ABDOMINAL HYSTERECTOMY  1992   fibroids  . APPENDECTOMY    . BREAST EXCISIONAL BIOPSY Right    benign  . BREAST LUMPECTOMY WITH RADIOACTIVE SEED LOCALIZATION Right 10/05/2014   Procedure: BREAST LUMPECTOMY WITH RADIOACTIVE SEED LOCALIZATION;  Surgeon: Erroll Luna, MD;  Location: [facility];  Service: General;  Laterality: Right;  . cataract Bilateral   . COLONOSCOPY  2013   Dr. Oneida Alar :  internal hemorrhoids, mild left-sided diverticulosis  . COLONOSCOPY N/A 09/21/2019   Procedure: COLONOSCOPY;  Surgeon: Danie Binder, MD;  [facility];   Service: Endoscopy;  Laterality: N/A;  12:00pm  . CYST EXCISION Right    arm  . ESOPHAGOGASTRODUODENOSCOPY  2013   Dr. Oneida Alar: gastritis likely NSAID-induced. Duodenal polyp. Duodenitis and gastritis in setting of aspirin and Ibuprofen.   Marland Kitchen EYE SURGERY Right 06/07/2013   cataract  . EYE SURGERY Left 09/14/2013   cataract  . KNEE ARTHROSCOPY Right 02/26/2016   Procedure: ARTHROSCOPY RIGHT KNEE WITH MENSICAL DEBRIDEMENT;  Surgeon: Gaynelle Arabian, MD;  Location: [facility];  Service: Orthopedics;  Laterality: Right;  . left breast biopsy    . LESION REMOVAL Right 03/30/2013   Procedure: EXCISION OF NEOPLASM ARM;  Surgeon: Jamesetta So, MD;  Location: [facility];  Service: General;  Laterality: Right;  . TUBAL LIGATION     Family History  Problem Relation Age of Onset  . Hypertension Father   . Congestive Heart Failure Father   . Liver cancer Mother   . Hypertension Sister   . Hypertension Brother   . Diabetes Brother   . Colon cancer Neg Hx   . Colon polyps Neg Hx    Social History   Socioeconomic History  . Marital status: Widowed    Spouse name: Not on file  . Number of children: Not on file  . Years of education: Not on file  . Highest education level: Not on file  Occupational History  . Not on file  Tobacco Use  . Smoking status: Never Smoker  . Smokeless tobacco: Never Used  Vaping Use  . Vaping Use: Never used  Substance and Sexual Activity  . Alcohol use: No  . Drug use: No  . Sexual activity: Not Currently    Birth control/protection: Surgical  Other Topics Concern  . Not on file  Social History Narrative  . Not on file   Social Determinants of Health   Financial Resource Strain: Not on file  Food Insecurity: Not on file  Transportation Needs: Not on file  Physical Activity: Not on file  Stress: Not on file  Social Connections: Not on file    Tobacco Counseling Counseling given: Not Answered   Clinical Intake:                 Diabetic? no          Activities of Daily Living No flowsheet data found.  Patient Care Team: Fayrene Helper, MD as PCP - General Fields, Marga Melnick, MD (Inactive) (Gastroenterology) Hurley Cisco, MD as Consulting Physician (Rheumatology) Whitney Muse, Kelby Fam, MD (Inactive) as Consulting Physician (Hematology and Oncology)  Indicate any recent Medical Services you may have received from other than [facility] providers in the past year (date may be approximate).     Assessment:   This is a routine wellness examination for Jerra.  Hearing/Vision screen No exam data present  Dietary issues and exercise activities discussed:    Goals Addressed   None    Depression Screen PHQ  2/9 Scores 01/29/2021 11/26/2020 07/09/2020 01/05/2020 09/01/2019 04/18/2019 03/15/2019  PHQ - 2 Score 0 0 0 0 0 0 0  PHQ- 9 Score - - - - - - -    Fall Risk Fall Risk  01/29/2021 01/22/2021 11/26/2020 07/09/2020 06/20/2020  Falls in the past year? 0 1 0 1 1  Number falls in past yr: 0 0 0 0 0  Injury with Fall? 0 0 0 1 0  Risk Factor Category  - - - - -  Risk for fall due to : No Fall Risks - No Fall Risks - -  Follow up Falls evaluation completed - Falls evaluation completed - -    FALL RISK PREVENTION PERTAINING TO THE HOME:  Any stairs in or around the home? No  If so, are there any without handrails? n/a Home free of loose throw rugs in walkways, pet beds, electrical cords, etc? Yes  Adequate lighting in your home to reduce risk of falls? Yes   ASSISTIVE DEVICES UTILIZED TO PREVENT FALLS:  Life alert? No  Use of a cane, walker or w/c? No  Grab bars in the bathroom? Yes  Shower chair or bench in shower? Yes  Elevated toilet seat or a handicapped toilet? Yes   TIMED UP AND GO:  Was the test performed? No .  Length of time to ambulate n/a    Cognitive Function:     6CIT Screen 02/06/2020 09/01/2019 02/03/2019 01/11/2018 01/01/2017  What Year? 0 points 0 points 0 points 0 points 0 points  What month? 0 points 0  points 0 points 0 points 0 points  What time? 0 points 0 points 0 points 0 points 0 points  Count back from 20 0 points 0 points 4 points 0 points 0 points  Months in reverse 0 points 0 points 0 points 0 points 0 points  Repeat phrase 4 points 0 points 0 points 0 points 0 points  Total Score 4 0 4 0 0    Immunizations Immunization History  Administered Date(s) Administered  . Fluad Quad(high Dose 65+) 05/18/2019, 06/20/2020  . H1N1 10/16/2008  . Influenza Whole 07/02/2007, 06/14/2009, 06/04/2010  . Influenza,inj,Quad PF,6+ Mos 07/20/2013, 06/01/2014, 06/26/2015, 07/28/2016, 05/19/2017  . Moderna Sars-Covid-2 Vaccination 10/01/2019, 10/31/2019  . Pneumococcal Conjugate-13 09/28/2014  . Pneumococcal Polysaccharide-23 04/20/2009  . Td 05/01/2004  . Zoster 10/23/2006    TDAP status: Due, Education has been provided regarding the importance of this vaccine. Advised may receive this vaccine at local pharmacy or Health Dept. Aware to provide a copy of the vaccination record if obtained from local pharmacy or Health Dept. Verbalized acceptance and understanding.  Flu Vaccine status: Up to date  Pneumococcal vaccine status: Up to date  Covid-19 vaccine status: Completed vaccines  Qualifies for Shingles Vaccine? Yes   Zostavax completed Yes   Shingrix Completed?: No.    Education has been provided regarding the importance of this vaccine. Patient has been advised to call insurance company to determine out of pocket expense if they have not yet received this vaccine. Advised may also receive vaccine at local pharmacy or Health Dept. Verbalized acceptance and understanding.  Screening Tests Health Maintenance  Topic Date Due  . COVID-19 Vaccine (3 - Moderna risk 4-dose series) 11/28/2019  . TETANUS/TDAP  08/14/2022 (Originally 05/01/2014)  . INFLUENZA VACCINE  04/22/2021  . HEMOGLOBIN A1C  05/29/2021  . FOOT EXAM  06/20/2021  . OPHTHALMOLOGY EXAM  06/20/2021  . DEXA SCAN  Completed  .  Hepatitis C Screening  Completed  . PNA vac Low Risk Adult  Completed  . HPV VACCINES  Aged Out    Health Maintenance  Health Maintenance Due  Topic Date Due  . COVID-19 Vaccine (3 - Moderna risk 4-dose series) 11/28/2019     Colorectal cancer screening: Type of screening: Colonoscopy. Completed 09/21/2019. Repeat every 10 years  Mammogram status: Completed 02/06/21. Repeat every year  Bone Density status: Completed 05/21/18. Results reflect: Bone density results: NORMAL. Repeat every 5 years.  Lung Cancer Screening: (Low Dose CT Chest recommended if Age 37-80 years, 30 pack-year currently smoking OR have quit w/in 15years.) does not qualify.   Lung Cancer Screening Referral: n/a  Additional Screening:  Hepatitis C Screening: does not qualify; Completed   Vision Screening: Recommended annual ophthalmology exams for early detection of glaucoma and other disorders of the eye. Is the patient up to date with their annual eye exam?  Yes  Who is the provider or what is the name of the office in which the patient attends annual eye exams? Dr. Mindi Junker If pt is not established with a provider, would they like to be referred to a provider to establish care? /a.   Dental Screening: Recommended annual dental exams for proper oral hygiene  Community Resource Referral / Chronic Care Management: CRR required this visit?  No   CCM required this visit?  No      Plan:     I have personally reviewed and noted the following in the patient's chart:   . Medical and social history . Use of alcohol, tobacco or illicit drugs  . Current medications and supplements including opioid prescriptions.  . Functional ability and status . Nutritional status . Physical activity . Advanced directives . List of other physicians . Hospitalizations, surgeries, and ER visits in previous 12 months . Vitals . Screenings to include cognitive, depression, and falls . Referrals and appointments  In addition,  I have reviewed and discussed with patient certain preventive protocols, quality metrics, and best practice recommendations. A written personalized care plan for preventive services as well as general preventive health recommendations were provided to patient.     Laretta Bolster, [place]   0/94/0768   Nurse Notes: AWV conducted by nurse in office by phone. Patient gave consent to telehealth visit via audio. Patient at home at the time of this visit. Provider here in the office at the time of this visit. Visit took 30 minutes to complete.

## 2021-02-13 ENCOUNTER — Other Ambulatory Visit (HOSPITAL_COMMUNITY): Payer: Self-pay

## 2021-02-13 ENCOUNTER — Inpatient Hospital Stay (HOSPITAL_COMMUNITY): Payer: Medicare HMO | Admitting: Hematology

## 2021-02-13 ENCOUNTER — Other Ambulatory Visit (HOSPITAL_COMMUNITY): Payer: Self-pay | Admitting: *Deleted

## 2021-02-13 ENCOUNTER — Other Ambulatory Visit: Payer: Self-pay

## 2021-02-13 VITALS — BP 122/77 | HR 90 | Temp 97.0°F | Resp 18 | Wt 191.0 lb

## 2021-02-13 DIAGNOSIS — E538 Deficiency of other specified B group vitamins: Secondary | ICD-10-CM | POA: Diagnosis not present

## 2021-02-13 DIAGNOSIS — D241 Benign neoplasm of right breast: Secondary | ICD-10-CM

## 2021-02-13 DIAGNOSIS — E559 Vitamin D deficiency, unspecified: Secondary | ICD-10-CM | POA: Diagnosis not present

## 2021-02-13 DIAGNOSIS — R61 Generalized hyperhidrosis: Secondary | ICD-10-CM | POA: Diagnosis not present

## 2021-02-13 DIAGNOSIS — N6091 Unspecified benign mammary dysplasia of right breast: Secondary | ICD-10-CM | POA: Diagnosis not present

## 2021-02-13 MED ORDER — VITAMIN B-12 1000 MCG PO TABS: 1000.0000 ug | ORAL_TABLET | ORAL | 1 refills | Status: DC

## 2021-02-13 MED ORDER — VITAMIN D 25 MCG (1000 UNIT) PO TABS: 2000.0000 [IU] | ORAL_TABLET | Freq: Every day | ORAL | 3 refills | Status: DC

## 2021-02-13 NOTE — Patient Instructions (Signed)
[place] at [facility] Discharge Instructions  You were seen today by Dr. Delton Coombes. He went over your recent results. Begin taking Vitamin D tablets: one 2,000 unit tablet a day. You will be scheduled for a mammogram after 02/06/2022. Dr. Delton Coombes will see you back in 1 year for labs and follow up.   Thank you for choosing [place] at [facility] to provide your oncology and hematology care.  To afford each patient quality time with our provider, please arrive at least 15 minutes before your scheduled appointment time.   If you have a lab appointment with the [facility] please come in thru the Main Entrance and check in at the main information desk  You need to re-schedule your appointment should you arrive 10 or more minutes late.  We strive to give you quality time with our providers, and arriving late affects you and other patients whose appointments are after yours.  Also, if you no show three or more times for appointments you may be dismissed from the clinic at the providers discretion.     Again, thank you for choosing [facility].  Our hope is that these requests will decrease the amount of time that you wait before being seen by our physicians.       _____________________________________________________________  Should you have questions after your visit to [facility], please contact our office at [phone] between the hours of 8:00 a.m. and 4:30 p.m.  Voicemails left after 4:00 p.m. will not be returned until the following business day.  For prescription refill requests, have your pharmacy contact our office and allow 72 hours.    Cancer Center Support Programs:   > Cancer Support Group  2nd Tuesday of the month 1pm-2pm, Journey Room

## 2021-03-08 DIAGNOSIS — M7662 Achilles tendinitis, left leg: Secondary | ICD-10-CM | POA: Diagnosis not present

## 2021-03-29 ENCOUNTER — Other Ambulatory Visit: Payer: Self-pay | Admitting: Family Medicine

## 2021-04-02 ENCOUNTER — Ambulatory Visit (INDEPENDENT_AMBULATORY_CARE_PROVIDER_SITE_OTHER): Payer: Medicare HMO | Admitting: Family Medicine

## 2021-04-02 ENCOUNTER — Encounter: Payer: Self-pay | Admitting: Family Medicine

## 2021-04-02 ENCOUNTER — Other Ambulatory Visit: Payer: Self-pay

## 2021-04-02 VITALS — BP 124/73 | HR 80 | Temp 98.0°F | Resp 20 | Ht 64.0 in | Wt 193.0 lb

## 2021-04-02 DIAGNOSIS — M170 Bilateral primary osteoarthritis of knee: Secondary | ICD-10-CM

## 2021-04-02 DIAGNOSIS — E1159 Type 2 diabetes mellitus with other circulatory complications: Secondary | ICD-10-CM | POA: Diagnosis not present

## 2021-04-02 DIAGNOSIS — E785 Hyperlipidemia, unspecified: Secondary | ICD-10-CM

## 2021-04-02 DIAGNOSIS — I10 Essential (primary) hypertension: Secondary | ICD-10-CM | POA: Diagnosis not present

## 2021-04-02 MED ORDER — METFORMIN HCL ER 500 MG PO TB24: ORAL_TABLET | ORAL | 3 refills | Status: DC

## 2021-04-02 NOTE — Patient Instructions (Addendum)
Annual exam in office early November, call if you need me sooner  Please get shingrix vaccines at your pharmacy.  Please bring documentation of your covid booster to next visit  Blood pressure and labs today are very good.  Please start Tylenol PM one at bedtime to help with sleep and generalized arthritic pain.  Fasting lipids CMP and EGFR hemoglobin A1c and microalbumin to be done 3 to 5 days before visit.  Patient specifically requests a small needle and may need to go to the hospital for lab work  Thanks for choosing [facility], we consider it a privelige to serve you.

## 2021-04-02 NOTE — Progress Notes (Signed)
Jillian [name]     MRN: [id number]      DOB: October 14, 1940   HPI Jillian [name] is here for follow up and re-evaluation of chronic medical conditions, medication management and review of any available recent lab and radiology data.  Preventive health is updated, specifically  Cancer screening and Immunization.   Questions or concerns regarding consultations or procedures which the PT has had in the interim are  addressed. The PT denies any adverse reactions to current medications since the last visit.  There are no new concerns.  There are no specific complaints   ROS Denies recent fever or chills. Denies sinus pressure, nasal congestion, ear pain or sore throat. Denies chest congestion, productive cough or wheezing. Denies chest pains, palpitations and leg swelling Denies abdominal pain, nausea, vomiting,diarrhea or constipation.   Denies dysuria, frequency, hesitancy or incontinence. C/o generalized  joint pain, swelling and limitation in mobility. Denies headaches, seizures, numbness, or tingling. Denies depression, anxiety or insomnia. Denies skin break down or rash.   PE  BP 124/73 (BP Location: Right Arm, Patient Position: Sitting, Cuff Size: Large)   Pulse 80   Temp 98 F (36.7 C)   Resp 20   Ht 5\' 4"  (1.626 m)   Wt 193 lb (87.5 kg)   SpO2 96%   BMI 33.13 kg/m   Patient alert and oriented and in no cardiopulmonary distress.  HEENT: No facial asymmetry, EOMI,     Neck supple .  Chest: Clear to auscultation bilaterally.  CVS: S1, S2 no murmurs, no S3.Regular rate.  ABD: Soft non tender.   Ext: No edema  MS: decreased  ROM spine, shoulders, hips and knees.  Skin: Intact, no ulcerations or rash noted.  Psych: Good eye contact, normal affect. Memory intact not anxious or depressed appearing.  CNS: CN 2-12 intact, power,  normal throughout.no focal deficits noted.   Assessment & Plan  ESSENTIAL HYPERTENSION, BENIGN Controlled, no change in medication DASH  diet and commitment to daily physical activity for a minimum of 30 minutes discussed and encouraged, as a part of hypertension management. The importance of attaining a healthy weight is also discussed.  BP/Weight 04/02/2021 02/13/2021 02/12/2021 01/29/2021 01/26/2021 02/29/6788 11/28/1015  Systolic BP 510 258 527 782 423 536 144  Diastolic BP 73 77 74 86 81 80 78  Wt. (Lbs) 193 191 191 191 - 189.4 188  BMI 33.13 32.79 32.79 32.79 - 32.51 32.27       Hyperlipidemia with target LDL less than 100 Hyperlipidemia:Low fat diet discussed and encouraged.   Lipid Panel  Lab Results  Component Value Date   CHOL 150 11/26/2020   HDL 52 11/26/2020   LDLCALC 79 11/26/2020   TRIG 105 11/26/2020   CHOLHDL 2.9 07/03/2020       Type 2 diabetes mellitus with vascular disease ([place]) [name] is reminded of the importance of commitment to daily physical activity for 30 minutes or more, as able and the need to limit carbohydrate intake to 30 to 60 grams per meal to help with blood sugar control.    [name] is reminded of the importance of daily foot exam, annual eye examination, and good blood sugar, blood pressure and cholesterol control.  Diabetic Labs Latest Ref Rng & Units 02/06/2021 11/26/2020 07/09/2020 07/03/2020 05/07/2020  HbA1c 4.8 - 5.6 % - 6.7(H) 6.3(A) - -  Microalbumin mg/dL - - - - -  Micro/Creat Ratio <30 mcg/mg creat - - - - -  Chol 100 -  199 mg/dL - 150 - 141 -  HDL >39 mg/dL - 52 - 49(L) -  Calc LDL 0 - 99 mg/dL - 79 - 72 -  Triglycerides 0 - 149 mg/dL - 105 - 118 -  Creatinine 0.44 - 1.00 mg/dL 1.00 1.03(H) 0.90 - 1.23(H)   BP/Weight 04/02/2021 02/13/2021 02/12/2021 01/29/2021 01/26/2021 10/27/1896 12/22/1029  Systolic BP 281 188 677 373 668 159 470  Diastolic BP 73 77 74 86 81 80 78  Wt. (Lbs) 193 191 191 191 - 189.4 188  BMI 33.13 32.79 32.79 32.79 - 32.51 32.27   Foot/eye exam completion dates Latest Ref Rng & Units 06/20/2020 06/20/2020  Eye Exam No Retinopathy No Retinopathy -   Foot exam Order - - -  Foot Form Completion - - Done        Primary osteoarthritis of both knees Start twice daily tylenol and topical rub

## 2021-04-04 ENCOUNTER — Encounter: Payer: Self-pay | Admitting: Family Medicine

## 2021-04-04 ENCOUNTER — Telehealth: Payer: Self-pay | Admitting: Family Medicine

## 2021-04-04 ENCOUNTER — Other Ambulatory Visit: Payer: Self-pay

## 2021-04-04 MED ORDER — CLONIDINE HCL 0.3 MG PO TABS: ORAL_TABLET | ORAL | 1 refills | Status: DC

## 2021-04-04 NOTE — Assessment & Plan Note (Signed)
Jillian [name] is reminded of the importance of commitment to daily physical activity for 30 minutes or more, as able and the need to limit carbohydrate intake to 30 to 60 grams per meal to help with blood sugar control.    Jillian [name] is reminded of the importance of daily foot exam, annual eye examination, and good blood sugar, blood pressure and cholesterol control.  Diabetic Labs Latest Ref Rng & Units 02/06/2021 11/26/2020 07/09/2020 07/03/2020 05/07/2020  HbA1c 4.8 - 5.6 % - 6.7(H) 6.3(A) - -  Microalbumin mg/dL - - - - -  Micro/Creat Ratio <30 mcg/mg creat - - - - -  Chol 100 - 199 mg/dL - 150 - 141 -  HDL >39 mg/dL - 52 - 49(L) -  Calc LDL 0 - 99 mg/dL - 79 - 72 -  Triglycerides 0 - 149 mg/dL - 105 - 118 -  Creatinine 0.44 - 1.00 mg/dL 1.00 1.03(H) 0.90 - 1.23(H)   BP/Weight 04/02/2021 02/13/2021 02/12/2021 01/29/2021 01/26/2021 05/25/7901 4/0/9735  Systolic BP 329 924 268 341 962 229 798  Diastolic BP 73 77 74 86 81 80 78  Wt. (Lbs) 193 191 191 191 - 189.4 188  BMI 33.13 32.79 32.79 32.79 - 32.51 32.27   Foot/eye exam completion dates Latest Ref Rng & Units 06/20/2020 06/20/2020  Eye Exam No Retinopathy No Retinopathy -  Foot exam Order - - -  Foot Form Completion - - Done

## 2021-04-04 NOTE — Telephone Encounter (Signed)
Sent a 90 day supply to mail order

## 2021-04-04 NOTE — Assessment & Plan Note (Signed)
Hyperlipidemia:Low fat diet discussed and encouraged.   Lipid Panel  Lab Results  Component Value Date   CHOL 150 11/26/2020   HDL 52 11/26/2020   LDLCALC 79 11/26/2020   TRIG 105 11/26/2020   CHOLHDL 2.9 07/03/2020

## 2021-04-04 NOTE — Telephone Encounter (Signed)
Pt needs a 90 day supply  cloNIDine (CATAPRES) 0.3 MG tablet

## 2021-04-04 NOTE — Assessment & Plan Note (Signed)
Controlled, no change in medication DASH diet and commitment to daily physical activity for a minimum of 30 minutes discussed and encouraged, as a part of hypertension management. The importance of attaining a healthy weight is also discussed.  BP/Weight 04/02/2021 02/13/2021 02/12/2021 01/29/2021 01/26/2021 0/03/8674 12/25/9199  Systolic BP 007 121 975 883 254 982 641  Diastolic BP 73 77 74 86 81 80 78  Wt. (Lbs) 193 191 191 191 - 189.4 188  BMI 33.13 32.79 32.79 32.79 - 32.51 32.27

## 2021-04-04 NOTE — Assessment & Plan Note (Signed)
Start twice daily tylenol and topical rub

## 2021-04-05 NOTE — Addendum Note (Signed)
Addended by: Lonn Georgia on: 04/05/2021 09:26 AM   Modules accepted: Orders

## 2021-04-18 ENCOUNTER — Other Ambulatory Visit (HOSPITAL_COMMUNITY): Payer: Self-pay | Admitting: Hematology

## 2021-04-18 DIAGNOSIS — E538 Deficiency of other specified B group vitamins: Secondary | ICD-10-CM

## 2021-04-24 ENCOUNTER — Other Ambulatory Visit: Payer: Self-pay

## 2021-04-24 ENCOUNTER — Other Ambulatory Visit (HOSPITAL_COMMUNITY)
Admission: RE | Admit: 2021-04-24 | Discharge: 2021-04-24 | Disposition: A | Discharge status: Home / Self Care | Payer: Medicare HMO | Source: Ambulatory Visit | Attending: Family Medicine | Admitting: Family Medicine

## 2021-04-24 DIAGNOSIS — E785 Hyperlipidemia, unspecified: Secondary | ICD-10-CM | POA: Diagnosis not present

## 2021-04-24 DIAGNOSIS — E1159 Type 2 diabetes mellitus with other circulatory complications: Secondary | ICD-10-CM | POA: Diagnosis not present

## 2021-04-24 LAB — COMPREHENSIVE METABOLIC PANEL
ALT: 14 U/L (ref 0–44)
AST: 20 U/L (ref 15–41)
Albumin: 3.9 g/dL (ref 3.5–5.0)
Alkaline Phosphatase: 70 U/L (ref 38–126)
Anion gap: 8 (ref 5–15)
BUN: 12 mg/dL (ref 8–23)
CO2: 25 mmol/L (ref 22–32)
Calcium: 8.8 mg/dL — ABNORMAL LOW (ref 8.9–10.3)
Chloride: 106 mmol/L (ref 98–111)
Creatinine, Ser: 1.16 mg/dL — ABNORMAL HIGH (ref 0.44–1.00)
GFR, Estimated: 48 mL/min — ABNORMAL LOW (ref >60)
Glucose, Bld: 159 mg/dL — ABNORMAL HIGH (ref 70–99)
Potassium: 3.8 mmol/L (ref 3.5–5.1)
Sodium: 139 mmol/L (ref 135–145)
Total Bilirubin: 0.4 mg/dL (ref 0.3–1.2)
Total Protein: 7.4 g/dL (ref 6.5–8.1)

## 2021-04-24 LAB — HEMOGLOBIN A1C
Hgb A1c MFr Bld: 6.9 % — ABNORMAL HIGH (ref 4.8–5.6)
Mean Plasma Glucose: 151.33 mg/dL

## 2021-04-24 LAB — LIPID PANEL
Cholesterol: 128 mg/dL (ref 0–200)
HDL: 45 mg/dL (ref >40)
LDL Cholesterol: 62 mg/dL (ref 0–99)
Total CHOL/HDL Ratio: 2.8 RATIO
Triglycerides: 103 mg/dL (ref <150)
VLDL: 21 mg/dL (ref 0–40)

## 2021-04-24 LAB — TSH: TSH: 1.022 u[IU]/mL (ref 0.350–4.500)

## 2021-04-25 LAB — MICROALBUMIN / CREATININE URINE RATIO
Creatinine, Urine: 222.6 mg/dL
Microalb Creat Ratio: 5 mg/g creat (ref 0–29)
Microalb, Ur: 10.1 ug/mL — ABNORMAL HIGH

## 2021-04-28 ENCOUNTER — Other Ambulatory Visit: Payer: Self-pay | Admitting: Family Medicine

## 2021-05-07 ENCOUNTER — Telehealth: Payer: Self-pay | Admitting: Family Medicine

## 2021-05-07 NOTE — Telephone Encounter (Signed)
Moderma Vaccine   09-30-19 10-28-19 08-02-20 03-05-21

## 2021-05-07 NOTE — Telephone Encounter (Signed)
Documents updated in patients chart.

## 2021-05-13 ENCOUNTER — Telehealth: Payer: Self-pay | Admitting: Internal Medicine

## 2021-05-13 NOTE — Telephone Encounter (Signed)
Letter mailed

## 2021-05-13 NOTE — Telephone Encounter (Signed)
RECALL FOR MRI ABDOMEN

## 2021-05-20 ENCOUNTER — Encounter: Payer: Self-pay | Admitting: Emergency Medicine

## 2021-05-20 ENCOUNTER — Ambulatory Visit
Admission: EM | Admit: 2021-05-20 | Discharge: 2021-05-20 | Disposition: A | Discharge status: Home / Self Care | Payer: Medicare HMO | Attending: Family Medicine | Admitting: Family Medicine

## 2021-05-20 ENCOUNTER — Other Ambulatory Visit: Payer: Self-pay

## 2021-05-20 DIAGNOSIS — M7662 Achilles tendinitis, left leg: Secondary | ICD-10-CM | POA: Diagnosis not present

## 2021-05-20 NOTE — ED Provider Notes (Signed)
[facility]    CSN: [id number] Arrival date & time: 05/20/21  1546      History   Chief Complaint Chief Complaint  Patient presents with   Ankle Pain    HPI Jillian [name] is a 80 y.o. female.   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  Patient has been seen by orthopedic provider who had recommended that she attends physical therapy for resolution of pain.  Patient has diffuse degenerative joint disease.  She was seen here in May and had a steroid injection related to chronic back pain.  She is also a diabetic. She endorses only taking Tylenol PM at nighttime as the pain is only present with ambulation.  She has not attempted relief with a compression sleeve nor any topical analgesics.  She has CKD 3 therefore NSAIDs is contraindicated.  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   [facility]ospital[facility] at [facility]   Hypertension 2005   Mild obesity    Postmenopausal    Rectal bleeding    Viral meningitis    Wears glasses     Patient Active Problem List   Diagnosis Date Noted   Chronic heel pain, left 01/22/2021   Epigastric pain 08/05/2020   Left hand pain 08/01/2020   Allergic reaction to drug 07/12/2020   Patellofemoral arthralgia of both knees 12/07/2019   Diverticulitis of colon 06/14/2019   Pancreatic lesion 06/14/2019   Chronic headache 04/03/2019   Obese 02/06/2019   Thoracic back pain 11/08/2018   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   Thrombocytopenia ([place]) 06/11/2018   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   Back pain with right-sided radiculopathy 04/28/2018   Longitudinal split nail 02/15/2018   Closed fracture of phalanx of foot 11/28/2017   Impingement syndrome of left shoulder  region 11/09/2017   Chronic left SI joint pain 05/21/2016   Primary osteoarthritis of both knees 06/26/2015   Papilloma of right breast 09/29/2014   Thoracic spine pain 09/28/2014   Shoulder pain, right 01/13/2014   Hip pain 08/06/2012   Chest wall pain 12/17/2011   Cervical neck pain with evidence of disc disease 12/17/2011   Hot flashes, menopausal 09/10/2011   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 Oneida Alar :  internal hemorrhoids, mild left-sided diverticulosis   COLONOSCOPY N/A 09/21/2019   Procedure: COLONOSCOPY;  Surgeon: Danie Binder, MD;  [facility];  Service: Endoscopy;  Laterality: N/A;  12:00pm   CYST EXCISION Right    arm   ESOPHAGOGASTRODUODENOSCOPY  2013   Dr. Oneida Alar: gastritis likely NSAID-induced. Duodenal polyp. Duodenitis  and gastritis in setting of aspirin and Ibuprofen.    EYE SURGERY Right 06/07/2013   cataract   EYE SURGERY Left 09/14/2013   cataract   KNEE ARTHROSCOPY Right 02/26/2016   Procedure: ARTHROSCOPY RIGHT KNEE WITH MENSICAL DEBRIDEMENT;  Surgeon: Gaynelle Arabian, MD;  Location: [facility];  Service: Orthopedics;  Laterality: Right;   left breast biopsy     LESION REMOVAL Right 03/30/2013   Procedure: EXCISION OF NEOPLASM ARM;  Surgeon: Jamesetta So, MD;  Location: [facility];  Service: General;  Laterality: Right;   TUBAL LIGATION      OB  History     Gravida  2   Para  2   Term  2   Preterm      AB      Living         SAB      IAB      Ectopic      Multiple      Live Births               Home Medications    Prior to Admission medications   Medication Sig Start Date End Date Taking? Authorizing Provider  acetaminophen (TYLENOL) 500 MG tablet Take 1,000 mg by mouth every 6 (six) hours as needed for mild pain.    [provider]  aspirin EC 81 MG tablet Take 1 tablet (81 mg total) by mouth every evening. Patient taking differently: Take 81 mg by mouth at bedtime. 04/23/16   Fayrene Helper, MD  benazepril (LOTENSIN) 5 MG tablet Take 1 tablet (5 mg total) by mouth daily. 01/22/21   Fayrene Helper, MD  Blood Glucose Monitoring Suppl (TRUE METRIX METER) w/Device KIT USE AS DIRECTED 01/30/21   Fayrene Helper, MD  cholecalciferol (VITAMIN D3) 25 MCG (1000 UNIT) tablet Take 2 tablets (2,000 Units total) by mouth daily. 02/13/21   Derek Jack, MD  cloNIDine (CATAPRES) 0.3 MG tablet TAKE 1 TABLET BY MOUTH EVERY EVENING AT 8 PM 04/29/21   Fayrene Helper, MD  famotidine (PEPCID) 40 MG tablet TAKE 1 TABLET(40 MG) BY MOUTH DAILY 08/01/20   Fayrene Helper, MD  ferrous sulfate 325 (65 FE) MG EC tablet TAKE 1 TABLET EVERY DAY 11/01/19   Lockamy, Randi L, NP-C  glucose blood test strip Use as instructed to check blood glucose once daily 09/08/19   Fayrene Helper, MD  metFORMIN (GLUCOPHAGE XR) 500 MG 24 hr tablet Take one tablet once daily every evening 04/02/21   Fayrene Helper, MD  pravastatin (PRAVACHOL) 10 MG tablet TAKE 1 TABLET EVERY DAY 04/01/21   Fayrene Helper, MD  vitamin B-12 (CYANOCOBALAMIN) 1000 MCG tablet TAKE 1 TABLET (1,000 MCG TOTAL) BY MOUTH EVERY MONDAY, [place], AND FRIDAY. 04/19/21   Derek Jack, MD    Family History Family History  Problem Relation Age of Onset   Hypertension Father    Congestive Heart Failure Father    Liver cancer Mother     Hypertension Sister    Hypertension Brother    Diabetes Brother    Colon cancer Neg Hx    Colon polyps Neg Hx     Social History Social History   Tobacco Use   Smoking status: Never   Smokeless tobacco: Never  Vaping Use   Vaping Use: Never used  Substance Use Topics   Alcohol use: No   Drug use: No     Allergies   Ciprofloxacin, Metronidazole, Codeine, Propoxyphene, and Propoxyphene  n-acetaminophen   Review of Systems Review of Systems Pertinent negatives listed in HPI   Physical Exam Triage Vital Signs ED Triage Vitals  Enc Vitals Group     BP 05/20/21 1740 (!) 155/82     Pulse Rate 05/20/21 1740 74     Resp 05/20/21 1740 14     Temp 05/20/21 1740 98.2 F (36.8 C)     Temp Source 05/20/21 1740 Tympanic     SpO2 05/20/21 1740 95 %     Weight --      Height --      Head Circumference --      Peak Flow --      Pain Score 05/20/21 1753 0     Pain Loc --      Pain Edu? --      Excl. in [place]? --    No data found.  Updated Vital Signs BP (!) 155/82 (BP Location: Right Arm)   Pulse 74   Temp 98.2 F (36.8 C) (Tympanic)   Resp 14   SpO2 95%   Visual Acuity Right Eye Distance:   Left Eye Distance:   Bilateral Distance:    Right Eye Near:   Left Eye Near:    Bilateral Near:     Physical Exam General appearance: Alert, well developed, well nourished, cooperative  Head: Normocephalic, without obvious abnormality, atraumatic Respiratory: Respirations even and unlabored, normal respiratory rate Heart: Rate and rhythm normal.  Extremities: No gross deformities Skin: Skin color, texture, turgor normal. No rashes seen  Psych: Appropriate mood and affect. Neurologic: GCS 15, normal coordination, normal gait UC Treatments / Results  Labs (all labs ordered are listed, but only abnormal results are displayed) Labs Reviewed - No data to display  EKG   Radiology No results found.  Procedures Procedures (including critical care time)  Medications  Ordered in UC Medications - No data to display  Initial Impression / Assessment and Plan / UC Course  I have reviewed the triage vital signs and the nursing notes.  Pertinent labs & imaging results that were available during my care of the patient were reviewed by me and considered in my medical decision making (see chart for details).   Achilles tendinitis left lower extremity Start Tylenol 1000 mg in morning and continue Tylenol 500 mg PM Wear compression wrap. Final Clinical Impressions(s) / UC Diagnoses   Final diagnoses:  Achilles tendinitis of left lower extremity     Discharge Instructions      Recommend 1000 of Tylenol in the morning and continue your Tylenol PM at night.  Recommend wrapping your ankle with weightbearing activities or purchase an over-the-counter ankle support compression sleeve to wear with weightbearing activities.  Resume use of Voltaren gel at least 3 times a day this will also help with pain.  Follow-up with your orthopedic provider to discuss therapy.   ED Prescriptions   None    PDMP not reviewed this encounter.   Scot Jun, FNP 05/20/21 1827

## 2021-05-20 NOTE — ED Triage Notes (Signed)
Pain to area near inner ankle that started sometime this year.  Denies any injury.  Has seen ortho and was told to go to therapy but pt did not go.

## 2021-05-20 NOTE — Discharge Instructions (Signed)
Recommend 1000 of Tylenol in the morning and continue your Tylenol PM at night.  Recommend wrapping your ankle with weightbearing activities or purchase an over-the-counter ankle support compression sleeve to wear with weightbearing activities.  Resume use of Voltaren gel at least 3 times a day this will also help with pain.  Follow-up with your orthopedic provider to discuss therapy.

## 2021-05-29 ENCOUNTER — Other Ambulatory Visit: Payer: Self-pay

## 2021-05-29 ENCOUNTER — Ambulatory Visit (INDEPENDENT_AMBULATORY_CARE_PROVIDER_SITE_OTHER): Payer: Medicare HMO | Admitting: Family Medicine

## 2021-05-29 ENCOUNTER — Encounter: Payer: Self-pay | Admitting: Family Medicine

## 2021-05-29 VITALS — BP 128/82 | HR 85 | Temp 98.0°F | Resp 18 | Ht 64.0 in | Wt 192.1 lb

## 2021-05-29 DIAGNOSIS — Z23 Encounter for immunization: Secondary | ICD-10-CM

## 2021-05-29 DIAGNOSIS — I10 Essential (primary) hypertension: Secondary | ICD-10-CM

## 2021-05-29 DIAGNOSIS — N1831 Chronic kidney disease, stage 3a: Secondary | ICD-10-CM | POA: Diagnosis not present

## 2021-05-29 DIAGNOSIS — N183 Chronic kidney disease, stage 3 unspecified: Secondary | ICD-10-CM | POA: Insufficient documentation

## 2021-05-29 DIAGNOSIS — Z6832 Body mass index (BMI) 32.0-32.9, adult: Secondary | ICD-10-CM | POA: Diagnosis not present

## 2021-05-29 DIAGNOSIS — E785 Hyperlipidemia, unspecified: Secondary | ICD-10-CM | POA: Diagnosis not present

## 2021-05-29 DIAGNOSIS — E1159 Type 2 diabetes mellitus with other circulatory complications: Secondary | ICD-10-CM | POA: Diagnosis not present

## 2021-05-29 DIAGNOSIS — G8929 Other chronic pain: Secondary | ICD-10-CM

## 2021-05-29 DIAGNOSIS — K5730 Diverticulosis of large intestine without perforation or abscess without bleeding: Secondary | ICD-10-CM

## 2021-05-29 DIAGNOSIS — M79672 Pain in left foot: Secondary | ICD-10-CM | POA: Diagnosis not present

## 2021-05-29 DIAGNOSIS — E6609 Other obesity due to excess calories: Secondary | ICD-10-CM

## 2021-05-29 MED ORDER — LINAGLIPTIN 5 MG PO TABS: 5.0000 mg | ORAL_TABLET | Freq: Every day | ORAL | 2 refills | Status: DC

## 2021-05-29 NOTE — Assessment & Plan Note (Signed)
Hyperlipidemia:Low fat diet discussed and encouraged.   Lipid Panel  Lab Results  Component Value Date   CHOL 128 04/24/2021   HDL 45 04/24/2021   LDLCALC 62 04/24/2021   TRIG 103 04/24/2021   CHOLHDL 2.8 04/24/2021     Controlled, no change in medication

## 2021-05-29 NOTE — Assessment & Plan Note (Signed)
Controlled, no change in medication DASH diet and commitment to daily physical activity for a minimum of 30 minutes discussed and encouraged, as a part of hypertension management. The importance of attaining a healthy weight is also discussed.  BP/Weight 05/29/2021 05/20/2021 04/02/2021 02/13/2021 02/12/2021 0000000 [id number]  Systolic BP 0000000 [id number] 123456 0000000 0000000  Diastolic BP 82 82 73 77 74 86 81  Wt. (Lbs) 192.12 - 193 191 191 191 -  BMI 32.98 - 33.13 32.79 32.79 32.79 -

## 2021-05-29 NOTE — Assessment & Plan Note (Signed)
Stop metformin , start tradjenta Avoid all pain meds except tylenol improve bP and diabetes control

## 2021-05-29 NOTE — Progress Notes (Signed)
COLIN PUCK     MRN: [id number]      DOB: 1940-10-08   HPI Jillian [name] is here for follow up and re-evaluation of chronic medical conditions, medication management and review of any available recent lab and radiology data.  Preventive health is updated, specifically  Cancer screening and Immunization.   Recently in urgent care with a diagnosis of Achilles tendinitis.  She is going to start physical therapy per her orthopedic doctor in the next week. Otherwise reports that she is doing well with no new concerns.  She does not test her blood pressure or blood sugar on a regular basis.  Labs are reviewed and medication changes are not needed. Denies polyuria, polydipsia, blurred vision , or hypoglycemic episodes.   The PT denies any adverse reactions to current medications since the last visit.  ROS Denies recent fever or chills. Denies sinus pressure, nasal congestion, ear pain or sore throat. Denies chest congestion, productive cough or wheezing. Denies chest pains, palpitations and leg swelling Denies abdominal pain, nausea, vomiting,diarrhea or constipation.   Denies dysuria, frequency, hesitancy or incontinence. DDenies headaches, seizures, numbness, or tingling. Denies depression, anxiety or insomnia. Denies skin break down or rash.   PE  BP 128/82   Pulse 85   Temp 98 F (36.7 C)   Resp 18   Ht '5\' 4"'$  (1.626 m)   Wt 192 lb 1.9 oz (87.1 kg)   SpO2 96%   BMI 32.98 kg/m    Patient alert and oriented and in no cardiopulmonary distress.  HEENT: No facial asymmetry, EOMI,     Neck supple .  Chest: Clear to auscultation bilaterally.  CVS: S1, S2 no murmurs, no S3.Regular rate.  ABD: Soft non tender.   Ext: No edema  MS: Adequate ROM spine, shoulders, hips and knees.  Skin: Intact, no ulcerations or rash noted.  Psych: Good eye contact, normal affect. Memory intact not anxious or depressed appearing.  CNS: CN 2-12 intact, power,  normal throughout.no focal  deficits noted.   Assessment & Plan  ESSENTIAL HYPERTENSION, BENIGN Controlled, no change in medication DASH diet and commitment to daily physical activity for a minimum of 30 minutes discussed and encouraged, as a part of hypertension management. The importance of attaining a healthy weight is also discussed.  BP/Weight 05/29/2021 05/20/2021 04/02/2021 02/13/2021 02/12/2021 0000000 [id number]  Systolic BP 0000000 [id number] 123456 0000000 0000000  Diastolic BP 82 82 73 77 74 86 81  Wt. (Lbs) 192.12 - 193 191 191 191 -  BMI 32.98 - 33.13 32.79 32.79 32.79 -       Hyperlipidemia with target LDL less than 100 Hyperlipidemia:Low fat diet discussed and encouraged.   Lipid Panel  Lab Results  Component Value Date   CHOL 128 04/24/2021   HDL 45 04/24/2021   LDLCALC 62 04/24/2021   TRIG 103 04/24/2021   CHOLHDL 2.8 04/24/2021     Controlled, no change in medication   Obese  Patient re-educated about  the importance of commitment to a  minimum of 150 minutes of exercise per week as able.  The importance of healthy food choices with portion control discussed, as well as eating regularly and within a 12 hour window most days. The need to choose "clean , green" food 50 to 75% of the time is discussed, as well as to make water the primary drink and set a goal of 64 ounces water daily.    Weight /BMI 05/29/2021 04/02/2021 02/13/2021  WEIGHT 192  lb 1.9 oz 193 lb 191 lb  HEIGHT '5\' 4"'$  '5\' 4"'$  -  BMI 32.98 kg/m2 33.13 kg/m2 32.79 kg/m2    ]  Type 2 diabetes mellitus with vascular disease ([place]) Jillian [name] is reminded of the importance of commitment to daily physical activity for 30 minutes or more, as able and the need to limit carbohydrate intake to 30 to 60 grams per meal to help with blood sugar control.   The need to take medication as prescribed, test blood sugar as directed, and to call between visits if there is a concern that blood sugar is uncontrolled is also discussed.   Jillian [name] is  reminded of the importance of daily foot exam, annual eye examination, and good blood sugar, blood pressure and cholesterol control. Change to tradjenta due to cKD  Diabetic Labs Latest Ref Rng & Units 04/24/2021 02/06/2021 11/26/2020 07/09/2020 07/03/2020  HbA1c 4.8 - 5.6 % 6.9(H) - 6.7(H) 6.3(A) -  Microalbumin Not Estab. ug/mL 10.1(H) - - - -  Micro/Creat Ratio 0 - 29 mg/g creat 5 - - - -  Chol 0 - 200 mg/dL 128 - 150 - 141  HDL >40 mg/dL 45 - 52 - 49(L)  Calc LDL 0 - 99 mg/dL 62 - 79 - 72  Triglycerides <150 mg/dL 103 - 105 - 118  Creatinine 0.44 - 1.00 mg/dL 1.16(H) 1.00 1.03(H) 0.90 -   BP/Weight 05/29/2021 05/20/2021 04/02/2021 02/13/2021 02/12/2021 0000000 [id number]  Systolic BP 0000000 [id number] 123456 0000000 0000000  Diastolic BP 82 82 73 77 74 86 81  Wt. (Lbs) 192.12 - 193 191 191 191 -  BMI 32.98 - 33.13 32.79 32.79 32.79 -   Foot/eye exam completion dates Latest Ref Rng & Units 06/20/2020 06/20/2020  Eye Exam No Retinopathy No Retinopathy -  Foot exam Order - - -  Foot Form Completion - - Done         CKD (chronic kidney disease) stage 3, GFR 30-59 ml/min (HCC) Stop metformin , start tradjenta Avoid all pain meds except tylenol improve bP and diabetes control  Diverticulosis of colon No current or recent flare  Chronic heel pain, left Starting PT for definitive management , directed by Ortho

## 2021-05-29 NOTE — Assessment & Plan Note (Signed)
No current or recent flare

## 2021-05-29 NOTE — Assessment & Plan Note (Signed)
Starting PT for definitive management , directed by Ortho

## 2021-05-29 NOTE — Patient Instructions (Signed)
Annual physical exam withj MD and re eval bP Oct 6 if possible around same time as B [place] if possible, call if you need me sooner.   Flu vaccine today  Stop metformin , start once daily linagliptin for diabetes  Test and record blood sugar every morning, range is 80 to 130, bring log and meter to next visit  Cut back on starchy foods and sweets, increase gereen , orange , red vegetables.   Decreased salt  Test and record blood pressure 3 times weekly and bring cuff to next visit  It is important that you exercise regularly at least 30 minutes 5 times a week. If you develop chest pain, have severe difficulty breathing, or feel very tired, stop exercising immediately and seek medical attention  .Thanks for choosing [facility], we consider it a privelige to serve you.

## 2021-05-29 NOTE — Assessment & Plan Note (Signed)
  Patient re-educated about  the importance of commitment to a  minimum of 150 minutes of exercise per week as able.  The importance of healthy food choices with portion control discussed, as well as eating regularly and within a 12 hour window most days. The need to choose "clean , green" food 50 to 75% of the time is discussed, as well as to make water the primary drink and set a goal of 64 ounces water daily.    Weight /BMI 05/29/2021 04/02/2021 02/13/2021  WEIGHT 192 lb 1.9 oz 193 lb 191 lb  HEIGHT '5\' 4"'$  '5\' 4"'$  -  BMI 32.98 kg/m2 33.13 kg/m2 32.79 kg/m2    ]

## 2021-05-29 NOTE — Assessment & Plan Note (Signed)
Jillian [name] is reminded of the importance of commitment to daily physical activity for 30 minutes or more, as able and the need to limit carbohydrate intake to 30 to 60 grams per meal to help with blood sugar control.   The need to take medication as prescribed, test blood sugar as directed, and to call between visits if there is a concern that blood sugar is uncontrolled is also discussed.   Jillian [name] is reminded of the importance of daily foot exam, annual eye examination, and good blood sugar, blood pressure and cholesterol control. Change to tradjenta due to cKD  Diabetic Labs Latest Ref Rng & Units 04/24/2021 02/06/2021 11/26/2020 07/09/2020 07/03/2020  HbA1c 4.8 - 5.6 % 6.9(H) - 6.7(H) 6.3(A) -  Microalbumin Not Estab. ug/mL 10.1(H) - - - -  Micro/Creat Ratio 0 - 29 mg/g creat 5 - - - -  Chol 0 - 200 mg/dL 128 - 150 - 141  HDL >40 mg/dL 45 - 52 - 49(L)  Calc LDL 0 - 99 mg/dL 62 - 79 - 72  Triglycerides <150 mg/dL 103 - 105 - 118  Creatinine 0.44 - 1.00 mg/dL 1.16(H) 1.00 1.03(H) 0.90 -   BP/Weight 05/29/2021 05/20/2021 04/02/2021 02/13/2021 02/12/2021 0000000 [id number]  Systolic BP 0000000 [id number] 123456 0000000 0000000  Diastolic BP 82 82 73 77 74 86 81  Wt. (Lbs) 192.12 - 193 191 191 191 -  BMI 32.98 - 33.13 32.79 32.79 32.79 -   Foot/eye exam completion dates Latest Ref Rng & Units 06/20/2020 06/20/2020  Eye Exam No Retinopathy No Retinopathy -  Foot exam Order - - -  Foot Form Completion - - Done

## 2021-06-11 ENCOUNTER — Telehealth: Payer: Self-pay | Admitting: Family Medicine

## 2021-06-11 ENCOUNTER — Ambulatory Visit (HOSPITAL_COMMUNITY): Payer: Medicare HMO | Admitting: Physical Therapy

## 2021-06-11 ENCOUNTER — Other Ambulatory Visit: Payer: Self-pay

## 2021-06-11 DIAGNOSIS — E1159 Type 2 diabetes mellitus with other circulatory complications: Secondary | ICD-10-CM

## 2021-06-11 MED ORDER — GLUCOSE BLOOD VI STRP
ORAL_STRIP | 0 refills | Status: DC
Start: 2021-06-11 — End: 2021-09-17

## 2021-06-11 NOTE — Telephone Encounter (Signed)
Rx sent and pt informed.

## 2021-06-11 NOTE — Telephone Encounter (Signed)
Pt stopped by , she needs a rx sent for True Metrix blood glucose test strips sent in to  [facility] on [address]

## 2021-06-12 ENCOUNTER — Ambulatory Visit (HOSPITAL_COMMUNITY): Payer: Medicare HMO | Attending: Orthopedic Surgery | Admitting: Physical Therapy

## 2021-06-12 ENCOUNTER — Other Ambulatory Visit: Payer: Self-pay

## 2021-06-12 ENCOUNTER — Encounter (HOSPITAL_COMMUNITY): Payer: Self-pay | Admitting: Physical Therapy

## 2021-06-12 DIAGNOSIS — M25572 Pain in left ankle and joints of left foot: Secondary | ICD-10-CM | POA: Diagnosis not present

## 2021-06-12 DIAGNOSIS — R262 Difficulty in walking, not elsewhere classified: Secondary | ICD-10-CM | POA: Diagnosis not present

## 2021-06-12 NOTE — Therapy (Signed)
[place], [place], [address] Phone: [phone]   Fax:  [phone]  Physical Therapy Evaluation  Patient Details  Name: Jillian [name] MRN: [id number] Date of Birth: Aug 19, 1941 Referring Provider (PT): Wylene Simmer MD   Encounter Date: 06/12/2021   PT End of Session - 06/12/21 1110     Visit Number 1    Number of Visits 8    Date for PT Re-Evaluation 07/10/21    Authorization Type [employer]    Authorization Time Period Check auth    PT Start Time 1032    PT Stop Time 1115    PT Time Calculation (min) 43 min    Activity Tolerance Patient tolerated treatment well    Behavior During Therapy [facility] for tasks assessed/performed             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 Oneida Alar :  internal hemorrhoids, mild left-sided diverticulosis   COLONOSCOPY N/A 09/21/2019   Procedure: COLONOSCOPY;  Surgeon: Danie Binder, MD;  [facility];  Service: Endoscopy;  Laterality: N/A;  12:00pm   CYST EXCISION Right    arm   ESOPHAGOGASTRODUODENOSCOPY  2013   Dr. Oneida Alar: gastritis likely  NSAID-induced. Duodenal polyp. Duodenitis and gastritis in setting of aspirin and Ibuprofen.    EYE SURGERY Right 06/07/2013   cataract   EYE SURGERY Left 09/14/2013   cataract   KNEE ARTHROSCOPY Right 02/26/2016   Procedure: ARTHROSCOPY RIGHT KNEE WITH MENSICAL DEBRIDEMENT;  Surgeon: Gaynelle Arabian, MD;  [facility];  Service: Orthopedics;  Laterality: Right;   left breast biopsy     LESION REMOVAL Right 03/30/2013   Procedure: EXCISION OF NEOPLASM ARM;  Surgeon: Jamesetta So, MD;  Location: [facility];  Service: General;  Laterality: Right;   TUBAL LIGATION      There were no vitals filed for this visit.    Subjective Assessment - 06/12/21 1041     Subjective Patient presents to therapy with complaint of LT ankle/ heel pain. Patient is unsure when this began. She notes that it hurts most when walking. She denies taking any medication for this issue.    Limitations Standing;Walking;House hold activities    Currently in Pain? No/denies                [facility] PT Assessment - 06/12/21 0001       Assessment   Medical Diagnosis LT achilles tendonitits    Referring Provider (PT) Wylene Simmer MD    Prior  Therapy Yes      Precautions   Precautions None      Restrictions   Weight Bearing Restrictions No      Balance Screen   Has the patient fallen in the past 6 months No      [place] residence   "family care home"     Prior Function   Level of Independence Independent      Cognition   Overall Cognitive Status Within Functional Limits for tasks assessed      Observation/Other Assessments   Focus on Therapeutic Outcomes (FOTO)  99% function      ROM / Strength   AROM / PROM / Strength AROM;Strength      AROM   AROM Assessment Site Ankle    Right/Left Ankle Right;Left    Right Ankle Dorsiflexion 10    Right Ankle Plantar Flexion 50    Left Ankle Dorsiflexion 9    Left Ankle Plantar Flexion 50      Strength   Strength  Assessment Site Hip;Knee;Ankle    Right/Left Hip Right;Left    Right Hip Flexion 5/5    Right Hip Extension 4/5    Right Hip ABduction 4/5    Left Hip Flexion 5/5    Left Hip Extension 4/5    Left Hip ABduction 4+/5    Right/Left Knee Right;Left    Right Knee Extension 4+/5    Left Knee Extension 4+/5    Right/Left Ankle Right;Left    Left Ankle Dorsiflexion 5/5    Left Ankle Plantar Flexion 4/5    Left Ankle Inversion 4+/5    Left Ankle Eversion 4+/5      Palpation   Palpation comment Mod TTP about LT achilles and posterior tibialis      Ambulation/Gait   Ambulation/Gait Yes    Ambulation/Gait Assistance 7: Independent    Assistive device None    Gait Pattern Step-through pattern;Decreased stride length;Decreased stance time - left;Antalgic    Ambulation Surface Level;Indoor                        Objective measurements completed on examination: See above findings.       [facility] Adult PT Treatment/Exercise - 06/12/21 0001       Exercises   Exercises Ankle      Ankle Exercises: Seated   Other Seated Ankle Exercises ankle band 3 way (PF/INV/EV) GTB x 10 each                     PT Education - 06/12/21 1041     Education Details on evalution findings, POC and HEP    Person(s) Educated Patient    Methods Explanation;Handout    Comprehension Verbalized understanding              PT Short Term Goals - 06/12/21 1114       PT SHORT TERM GOAL #1   Title Patient will be independent with initial HEP and self-management strategies to improve functional outcomes    Time 2    Period Weeks    Status New    Target Date 06/26/21               PT Long Term Goals - 06/12/21 1750       PT LONG TERM GOAL #1   Title Patient will be independent with advanced HEP and self-management strategies to improve functional outcomes  Time 4    Period Weeks    Status New    Target Date 07/10/21      PT LONG TERM GOAL #2   Title Patient  will report at least 70% overall improvement in subjective complaint to indicate improvement in ability to perform ADLs.    Time 4    Period Weeks    Status New    Target Date 07/10/21      PT LONG TERM GOAL #3   Title Patient will be able to walk >15 minutes with no increased ankle pain to demo improved gait mechanics and improved functional ability    Time 4    Period Weeks    Status New    Target Date 07/10/21                    Plan - 06/12/21 1110     Clinical Impression Statement Patient is a 80 y.o. female who presents to physical therapy with complaint of Lt ankle pain. Patient demonstrates decreased strength, slight ROM restriction, increased fascial restrictions and gait abnormalities which are likely contributing to symptoms of pain and are negatively impacting patient ability to perform ADLs and functional mobility tasks. Patient will benefit from skilled physical therapy services to address these deficits to reduce pain, improve level of function with ADLs and functional mobility tasks.    Examination-Activity Limitations Stairs;Stand;Transfers;Locomotion Level    Examination-Participation Restrictions Cleaning;Community Activity;Shop;Laundry;Yard Work    Stability/Clinical Decision Making Stable/Uncomplicated    Designer, jewellery Low    Rehab Potential Good    PT Frequency 2x / week    PT Duration 4 weeks    PT Treatment/Interventions ADLs/Self Care Home Management;Biofeedback;Cryotherapy;Electrical Stimulation;Iontophoresis 4mg /ml Dexamethasone;Traction;Moist Heat;Ultrasound;Parrafin;Fluidtherapy;Contrast Bath;DME Instruction;Gait training;Stair training;Functional mobility training;Therapeutic activities;Therapeutic exercise;Balance training;Manual techniques;Vasopneumatic Device;Taping;Splinting;Energy conservation;Orthotic Fit/Training;Dry needling;Passive range of motion;Patient/family education;Cognitive remediation;Neuromuscular re-education;Compression  bandaging;Scar mobilization;Visual/perceptual remediation/compensation;Spinal Manipulations;Joint Manipulations    PT Next Visit Plan Review goals and HEP. Progress ankle strength and stabilization. BAPs, rocker board, static balance, calf stretching, heel raise. Targeted manual therapy to achilles and posterior tibialis.    PT Home Exercise Plan Eval: ankle band PF/INV/EV    Consulted and Agree with Plan of Care Patient             Patient will benefit from skilled therapeutic intervention in order to improve the following deficits and impairments:  Abnormal gait, Decreased strength, Increased fascial restricitons, Pain, Decreased activity tolerance, Decreased mobility, Difficulty walking, Decreased range of motion, Impaired flexibility  Visit Diagnosis: Pain in left ankle and joints of left foot  Difficulty in walking, not elsewhere classified     Problem List Patient Active Problem List   Diagnosis Date Noted   CKD (chronic kidney disease) stage 3, GFR 30-59 ml/min (HCC) 05/29/2021   Chronic heel pain, left 01/22/2021   Allergic reaction to drug 07/12/2020   Patellofemoral arthralgia of both knees 12/07/2019   Diverticulosis of colon 06/14/2019   Pancreatic lesion 06/14/2019   Obese 02/06/2019   Thoracic back pain 11/08/2018   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   Thrombocytopenia ([place]) 06/11/2018   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   Back pain with right-sided radiculopathy 04/28/2018   Closed fracture of phalanx of foot 11/28/2017   Impingement syndrome of left shoulder region 11/09/2017   Chronic left SI joint pain 05/21/2016   Primary osteoarthritis of both knees 06/26/2015   Papilloma of right breast 09/29/2014   Thoracic spine pain 09/28/2014   Hip pain 08/06/2012  Cervical neck pain with evidence of disc disease 12/17/2011   Hot flashes, menopausal 09/10/2011   Type 2 diabetes mellitus with vascular disease ([place]) 01/08/2010   INSOMNIA,  CHRONIC 11/28/2009   Hyperlipidemia with target LDL less than 100 10/12/2007   ESSENTIAL HYPERTENSION, BENIGN 10/12/2007   5:53 PM, 06/12/21 Josue Hector PT DPT  Physical Therapist with [facility]  [phone] [place], [place], [address] Phone: [phone]   Fax:  [phone]  Name: Jillian [name] MRN: [id number] Date of Birth: 09-May-1941

## 2021-06-12 NOTE — Patient Instructions (Signed)
Access Code: [email or web address] URL: [email or web address] Date: 06/12/2021 Prepared by: Josue Hector  Exercises Seated Ankle Plantar Flexion with Resistance Loop - 3 x daily - 7 x weekly - 2 sets - 10 reps Seated Ankle Eversion with Resistance - 3 x daily - 7 x weekly - 2 sets - 10 reps Seated Figure 4 Ankle Inversion with Resistance - 3 x daily - 7 x weekly - 2 sets - 10 reps

## 2021-06-18 ENCOUNTER — Other Ambulatory Visit: Payer: Self-pay

## 2021-06-18 ENCOUNTER — Encounter (HOSPITAL_COMMUNITY): Payer: Self-pay

## 2021-06-18 ENCOUNTER — Ambulatory Visit (HOSPITAL_COMMUNITY): Payer: Medicare HMO

## 2021-06-18 DIAGNOSIS — M25572 Pain in left ankle and joints of left foot: Secondary | ICD-10-CM | POA: Diagnosis not present

## 2021-06-18 DIAGNOSIS — R262 Difficulty in walking, not elsewhere classified: Secondary | ICD-10-CM | POA: Diagnosis not present

## 2021-06-18 NOTE — Therapy (Signed)
[place], [place], [address] Phone: [phone]   Fax:  [phone]  Physical Therapy Treatment  Patient Details  Name: Jillian [name] MRN: [id number] Date of Birth: [birth year] Referring Provider (PT): Wylene Simmer MD   Encounter Date: 06/18/2021   PT End of Session - 06/18/21 1319     Visit Number 2    Number of Visits 8    Date for PT Re-Evaluation 07/10/21    Authorization Type [employer] 8 visits approved    Authorization Time Period 09/21-->07/10/21    PT Start Time 1316    PT Stop Time 1400    PT Time Calculation (min) 44 min    Activity Tolerance Patient tolerated treatment well    Behavior During Therapy [facility] for tasks assessed/performed             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   Hypertension 2005   Mild obesity    Postmenopausal    Rectal bleeding    Viral meningitis    Wears glasses     Past Surgical History:  Procedure Laterality Date   ABDOMINAL HYSTERECTOMY  1992   fibroids   APPENDECTOMY     BREAST EXCISIONAL BIOPSY Right    benign   BREAST LUMPECTOMY WITH RADIOACTIVE SEED LOCALIZATION Right 10/05/2014   Procedure: BREAST LUMPECTOMY WITH RADIOACTIVE SEED LOCALIZATION;  Surgeon: Erroll Luna, MD;  Location: [facility];  Service: General;  Laterality: Right;   cataract Bilateral    COLONOSCOPY  2013   Dr. Oneida Alar :  internal hemorrhoids, mild left-sided diverticulosis   COLONOSCOPY N/A 09/21/2019   Procedure: COLONOSCOPY;  Surgeon: Danie Binder, MD;  [facility];  Service: Endoscopy;  Laterality: N/A;  12:00pm   CYST EXCISION Right    arm   ESOPHAGOGASTRODUODENOSCOPY  2013   Dr. Oneida Alar:  gastritis likely NSAID-induced. Duodenal polyp. Duodenitis and gastritis in setting of aspirin and Ibuprofen.    EYE SURGERY Right 06/07/2013   cataract   EYE SURGERY Left 09/14/2013   cataract   KNEE ARTHROSCOPY Right 02/26/2016   Procedure: ARTHROSCOPY RIGHT KNEE WITH MENSICAL DEBRIDEMENT;  Surgeon: Gaynelle Arabian, MD;  [facility];  Service: Orthopedics;  Laterality: Right;   left breast biopsy     LESION REMOVAL Right 03/30/2013   Procedure: EXCISION OF NEOPLASM ARM;  Surgeon: Jamesetta So, MD;  Location: [facility];  Service: General;  Laterality: Right;   TUBAL LIGATION      There were no vitals filed for this visit.   Subjective Assessment - 06/18/21 1318     Subjective Pt stated her ankle feels good today, reports some pain with different shoes and walking sometimes.    Currently in Pain? No/denies                               [facility] Adult PT Treatment/Exercise - 06/18/21 0001       Exercises   Exercises Ankle      Manual Therapy   Manual Therapy Soft tissue mobilization    Manual therapy comments Manual complete separate  than rest of tx    Soft tissue mobilization Posterior tib, gastroc mm, "happy feet" for mobility; supine position with wedge      Ankle Exercises: Stretches   Gastroc Stretch 2 reps;30 seconds    Gastroc Stretch Limitations front of door      Ankle Exercises: Standing   SLS 1" max    Rocker Board 2 minutes   lateral   Heel Raises Both;10 reps   2 sets   Toe Raise 3 seconds    Other Standing Ankle Exercises tandem stance 2x 20" with HHA      Ankle Exercises: Seated   Ankle Circles/Pumps 10 reps    BAPS Level 3;Sitting;10 reps   DF/PF; Inv/Ev; CW/CCW                    PT Education - 06/18/21 1325     Education Details Reviewed goals, educated importance of HEP compliance for maximal benefits, encouraged to wear supportive shoes vs high heels.    Person(s) Educated Patient    Methods Explanation;Demonstration     Comprehension Verbalized understanding;Returned demonstration              PT Short Term Goals - 06/12/21 1114       PT SHORT TERM GOAL #1   Title Patient will be independent with initial HEP and self-management strategies to improve functional outcomes    Time 2    Period Weeks    Status New    Target Date 06/26/21               PT Long Term Goals - 06/12/21 1750       PT LONG TERM GOAL #1   Title Patient will be independent with advanced HEP and self-management strategies to improve functional outcomes    Time 4    Period Weeks    Status New    Target Date 07/10/21      PT LONG TERM GOAL #2   Title Patient will report at least 70% overall improvement in subjective complaint to indicate improvement in ability to perform ADLs.    Time 4    Period Weeks    Status New    Target Date 07/10/21      PT LONG TERM GOAL #3   Title Patient will be able to walk >15 minutes with no increased ankle pain to demo improved gait mechanics and improved functional ability    Time 4    Period Weeks    Status New    Target Date 07/10/21                   Plan - 06/18/21 1406     Clinical Impression Statement Reviewed goals, educated importance of HEP compliance for maximal beneftis, pt reports she has began the theraband exercises without questions.  Session focus on ankle mobility and strengthening with good form following initial cuieng for all exercises.  Pt presents with tenderness to palpation of posterior tib during STM.  No reports of increased pain through session.    Examination-Activity Limitations Stairs;Stand;Transfers;Locomotion Level    Examination-Participation Restrictions Cleaning;Community Activity;Shop;Laundry;Yard Work    Stability/Clinical Decision Making Stable/Uncomplicated    Designer, jewellery Low    Rehab Potential Good    PT Frequency 2x / week    PT Duration 4 weeks    PT Treatment/Interventions ADLs/Self Care Home  Management;Biofeedback;Cryotherapy;Electrical Stimulation;Iontophoresis 4mg /ml Dexamethasone;Traction;Moist Heat;Ultrasound;Parrafin;Fluidtherapy;Contrast Bath;DME Instruction;Gait training;Stair training;Functional mobility training;Therapeutic activities;Therapeutic exercise;Balance training;Manual techniques;Vasopneumatic Device;Taping;Splinting;Energy  conservation;Orthotic Fit/Training;Dry needling;Passive range of motion;Patient/family education;Cognitive remediation;Neuromuscular re-education;Compression bandaging;Scar mobilization;Visual/perceptual remediation/compensation;Spinal Manipulations;Joint Manipulations    PT Next Visit Plan Progress ankle strength and stabilization. BAPs, rocker board, static balance, calf stretching, heel raise. Targeted manual therapy to achilles and posterior tibialis.    PT Home Exercise Plan Eval: ankle band PF/INV/EV; 9/27: heel raise and gastroc stretch at wall.    Consulted and Agree with Plan of Care Patient             Patient will benefit from skilled therapeutic intervention in order to improve the following deficits and impairments:  Abnormal gait, Decreased strength, Increased fascial restricitons, Pain, Decreased activity tolerance, Decreased mobility, Difficulty walking, Decreased range of motion, Impaired flexibility  Visit Diagnosis: Pain in left ankle and joints of left foot  Difficulty in walking, not elsewhere classified     Problem List Patient Active Problem List   Diagnosis Date Noted   CKD (chronic kidney disease) stage 3, GFR 30-59 ml/min (HCC) 05/29/2021   Chronic heel pain, left 01/22/2021   Allergic reaction to drug 07/12/2020   Patellofemoral arthralgia of both knees 12/07/2019   Diverticulosis of colon 06/14/2019   Pancreatic lesion 06/14/2019   Obese 02/06/2019   Thoracic back pain 11/08/2018   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   Thrombocytopenia ([place]) 06/11/2018   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)  06/03/2018   Back pain with right-sided radiculopathy 04/28/2018   Closed fracture of phalanx of foot 11/28/2017   Impingement syndrome of left shoulder region 11/09/2017   Chronic left SI joint pain 05/21/2016   Primary osteoarthritis of both knees 06/26/2015   Papilloma of right breast 09/29/2014   Thoracic spine pain 09/28/2014   Hip pain 08/06/2012   Cervical neck pain with evidence of disc disease 12/17/2011   Hot flashes, menopausal 09/10/2011   Type 2 diabetes mellitus with vascular disease ([place]) 01/08/2010   INSOMNIA, CHRONIC 11/28/2009   Hyperlipidemia with target LDL less than 100 10/12/2007   ESSENTIAL HYPERTENSION, BENIGN 10/12/2007   Ihor Austin, LPTA/CLT; CBIS [phone]  Aldona Lento, PTA 06/18/2021, 5:31 PM  [place], [place], [address] Phone: [phone]   Fax:  [phone]  Name: Jillian [name] MRN: [id number] Date of Birth: [birth year]

## 2021-06-21 ENCOUNTER — Other Ambulatory Visit: Payer: Self-pay

## 2021-06-21 ENCOUNTER — Ambulatory Visit (HOSPITAL_COMMUNITY): Payer: Medicare HMO

## 2021-06-21 DIAGNOSIS — R262 Difficulty in walking, not elsewhere classified: Secondary | ICD-10-CM

## 2021-06-21 DIAGNOSIS — M25572 Pain in left ankle and joints of left foot: Secondary | ICD-10-CM

## 2021-06-21 NOTE — Patient Instructions (Signed)
Single Leg Balance: Eyes Open    Stand on right leg with eyes open. Hold 30 seconds. 5 reps 1 times per day.  http://ggbe.exer.us/5   Copyright  VHI. All rights reserved.   Tandem Stance    Right foot in front of left, heel touching toe both feet "straight ahead". Stand on Foot Triangle of Support with both feet.  Balance in this position 30 seconds. Do with left foot in front of right.  Copyright  VHI. All rights reserved.

## 2021-06-21 NOTE — Therapy (Signed)
[place], [place], [address] Phone: [phone]   Fax:  [phone]  Physical Therapy Treatment  Patient Details  Name: Jillian [name] MRN: [id number] Date of Birth: Jul 09, 1941 Referring Provider (PT): Wylene Simmer MD   Encounter Date: 06/21/2021   PT End of Session - 06/21/21 1011     Visit Number 3    Number of Visits 8    Date for PT Re-Evaluation 07/10/21    Authorization Type [employer] 8 visits approved    Authorization Time Period 09/21-->07/10/21    PT Start Time 1006    PT Stop Time 1046    PT Time Calculation (min) 40 min    Activity Tolerance Patient tolerated treatment well    Behavior During Therapy [facility] for tasks assessed/performed             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 Oneida Alar :  internal hemorrhoids, mild left-sided diverticulosis   COLONOSCOPY N/A 09/21/2019   Procedure: COLONOSCOPY;  Surgeon: Danie Binder, MD;  [facility];  Service: Endoscopy;  Laterality: N/A;  12:00pm   CYST EXCISION Right    arm   ESOPHAGOGASTRODUODENOSCOPY  2013   Dr. Oneida Alar:  gastritis likely NSAID-induced. Duodenal polyp. Duodenitis and gastritis in setting of aspirin and Ibuprofen.    EYE SURGERY Right 06/07/2013   cataract   EYE SURGERY Left 09/14/2013   cataract   KNEE ARTHROSCOPY Right 02/26/2016   Procedure: ARTHROSCOPY RIGHT KNEE WITH MENSICAL DEBRIDEMENT;  Surgeon: Gaynelle Arabian, MD;  [facility];  Service: Orthopedics;  Laterality: Right;   left breast biopsy     LESION REMOVAL Right 03/30/2013   Procedure: EXCISION OF NEOPLASM ARM;  Surgeon: Jamesetta So, MD;  [facility];  Service: General;  Laterality: Right;   TUBAL LIGATION      There were no vitals filed for this visit.   Subjective Assessment - 06/21/21 1010     Subjective Pt arrived wearing tennis shoes, reports comfort with no reports of pain today.  Reports ability to walk for 5 minutes prior fatigue.    Currently in Pain? No/denies                               [facility] Adult PT Treatment/Exercise - 06/21/21 0001       Exercises   Exercises Ankle      Manual Therapy   Manual Therapy Soft tissue mobilization  Manual therapy comments Manual complete separate than rest of tx    Soft tissue mobilization Posterior tib, gastroc mm, "happy feet" for mobility; supine position with wedge      Ankle Exercises: Stretches   Slant Board Stretch 3 reps;30 seconds      Ankle Exercises: Standing   SLS Rt 13", Lt 9" max of 3    Rocker Board 2 minutes   lateral and DF/PF   Heel Raises Both;10 reps;3 seconds    Toe Raise 10 reps;3 seconds    Other Standing Ankle Exercises tandem stance 3x 30" intermittent HHA    Other Standing Ankle Exercises vector stance 3x 5"                       PT Short Term Goals - 06/12/21 1114       PT SHORT TERM GOAL #1   Title Patient will be independent with initial HEP and self-management strategies to improve functional outcomes    Time 2    Period Weeks    Status New    Target Date 06/26/21                PT Long Term Goals - 06/12/21 1750       PT LONG TERM GOAL #1   Title Patient will be independent with advanced HEP and self-management strategies to improve functional outcomes    Time 4    Period Weeks    Status New    Target Date 07/10/21      PT LONG TERM GOAL #2   Title Patient will report at least 70% overall improvement in subjective complaint to indicate improvement in ability to perform ADLs.    Time 4    Period Weeks    Status New    Target Date 07/10/21      PT LONG TERM GOAL #3   Title Patient will be able to walk >15 minutes with no increased ankle pain to demo improved gait mechanics and improved functional ability    Time 4    Period Weeks    Status New    Target Date 07/10/21                   Plan - 06/21/21 1310     Clinical Impression Statement Pt tolerated well to sessoin.  Encouraged to begin walking program as reports most difficulty not just ankle but gets fatigued following 5 minutes.  Most difficult with balance training this session.  Given SLS and tandem stance to HEP, encouraged to complete front of counter for safety.  EOS with manual to address soft tissue restrictions, tenderness with palpation of posterior tib.  No reports of pain through session.    Examination-Activity Limitations Stairs;Stand;Transfers;Locomotion Level    Examination-Participation Restrictions Cleaning;Community Activity;Shop;Laundry;Yard Work    Stability/Clinical Decision Making Stable/Uncomplicated    Designer, jewellery Low    Rehab Potential Good    PT Frequency 2x / week    PT Duration 4 weeks    PT Treatment/Interventions ADLs/Self Care Home Management;Biofeedback;Cryotherapy;Electrical Stimulation;Iontophoresis 4mg /ml Dexamethasone;Traction;Moist Heat;Ultrasound;Parrafin;Fluidtherapy;Contrast Bath;DME Instruction;Gait training;Stair training;Functional mobility training;Therapeutic activities;Therapeutic exercise;Balance training;Manual  techniques;Vasopneumatic Device;Taping;Splinting;Energy conservation;Orthotic Fit/Training;Dry needling;Passive range of motion;Patient/family education;Cognitive remediation;Neuromuscular re-education;Compression bandaging;Scar mobilization;Visual/perceptual remediation/compensation;Spinal Manipulations;Joint Manipulations    PT Next Visit Plan Progress ankle strength, stability and balalnce.  Target manual therapy to achilles and posterior tib.    PT Home Exercise Plan Eval: ankle band PF/INV/EV; 9/27: heel raise and gastroc stretch at wall.;  9/30: SLS and tandem stance by counter    Consulted and Agree with Plan of Care Patient             Patient will benefit from skilled therapeutic intervention in order to improve the following deficits and impairments:  Abnormal gait, Decreased strength, Increased fascial restricitons, Pain, Decreased activity tolerance, Decreased mobility, Difficulty walking, Decreased range of motion, Impaired flexibility  Visit Diagnosis: Pain in left ankle and joints of left foot  Difficulty in walking, not elsewhere classified     Problem List Patient Active Problem List   Diagnosis Date Noted   CKD (chronic kidney disease) stage 3, GFR 30-59 ml/min (HCC) 05/29/2021   Chronic heel pain, left 01/22/2021   Allergic reaction to drug 07/12/2020   Patellofemoral arthralgia of both knees 12/07/2019   Diverticulosis of colon 06/14/2019   Pancreatic lesion 06/14/2019   Obese 02/06/2019   Thoracic back pain 11/08/2018   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   Thrombocytopenia ([place]) 06/11/2018   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   Back pain with right-sided radiculopathy 04/28/2018   Closed fracture of phalanx of foot 11/28/2017   Impingement syndrome of left shoulder region 11/09/2017   Chronic left SI joint pain 05/21/2016   Primary osteoarthritis of both knees 06/26/2015   Papilloma of right breast 09/29/2014   Thoracic spine pain  09/28/2014   Hip pain 08/06/2012   Cervical neck pain with evidence of disc disease 12/17/2011   Hot flashes, menopausal 09/10/2011   Type 2 diabetes mellitus with vascular disease ([place]) 01/08/2010   INSOMNIA, CHRONIC 11/28/2009   Hyperlipidemia with target LDL less than 100 10/12/2007   ESSENTIAL HYPERTENSION, BENIGN 10/12/2007   Ihor Austin, LPTA/CLT; CBIS [phone]  Aldona Lento, PTA 06/21/2021, 4:42 PM  [place], [place], [address] Phone: [phone]   Fax:  [phone]  Name: Jillian [name] MRN: [id number] Date of Birth: 12-May-1941

## 2021-06-26 ENCOUNTER — Ambulatory Visit (HOSPITAL_COMMUNITY): Payer: Medicare HMO | Attending: Orthopedic Surgery

## 2021-06-26 ENCOUNTER — Other Ambulatory Visit: Payer: Self-pay

## 2021-06-26 ENCOUNTER — Encounter (HOSPITAL_COMMUNITY): Payer: Self-pay

## 2021-06-26 DIAGNOSIS — M25572 Pain in left ankle and joints of left foot: Secondary | ICD-10-CM | POA: Diagnosis not present

## 2021-06-26 DIAGNOSIS — R262 Difficulty in walking, not elsewhere classified: Secondary | ICD-10-CM | POA: Diagnosis not present

## 2021-06-26 NOTE — Therapy (Signed)
[place], [place], [address] Phone: [phone]   Fax:  [phone]  Physical Therapy Treatment  Patient Details  Name: DELLE [name] MRN: [id number] Date of Birth: 19-Oct-1940 Referring Provider (PT): Wylene Simmer MD   Encounter Date: 06/26/2021   PT End of Session - 06/26/21 0832     Visit Number 4    Number of Visits 8    Date for PT Re-Evaluation 07/10/21    Authorization Type [employer] 8 visits approved    Authorization Time Period 09/21-->07/10/21    PT Start Time 0830    PT Stop Time 0910    PT Time Calculation (min) 40 min    Activity Tolerance Patient tolerated treatment well    Behavior During Therapy [facility] for tasks assessed/performed             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   [facility]ospital[facility] at [facility]   Hypertension 2005   Mild obesity    Postmenopausal    Rectal bleeding    Viral meningitis    Wears glasses     Past Surgical History:  Procedure Laterality Date   ABDOMINAL HYSTERECTOMY  1992   fibroids   APPENDECTOMY     BREAST EXCISIONAL BIOPSY Right    benign   BREAST LUMPECTOMY WITH RADIOACTIVE SEED LOCALIZATION Right 10/05/2014   Procedure: BREAST LUMPECTOMY WITH RADIOACTIVE SEED LOCALIZATION;  Surgeon: Erroll Luna, MD;  Location: [place];  Service: General;  Laterality: Right;   cataract Bilateral    COLONOSCOPY  2013   Dr. Oneida Alar :  internal hemorrhoids, mild left-sided diverticulosis   COLONOSCOPY N/A 09/21/2019   Procedure: COLONOSCOPY;  Surgeon: Danie Binder, MD;  [facility];  Service: Endoscopy;  Laterality: N/A;  12:00pm   CYST EXCISION Right    arm   ESOPHAGOGASTRODUODENOSCOPY  2013   Dr. Oneida Alar:  gastritis likely NSAID-induced. Duodenal polyp. Duodenitis and gastritis in setting of aspirin and Ibuprofen.    EYE SURGERY Right 06/07/2013   cataract   EYE SURGERY Left 09/14/2013   cataract   KNEE ARTHROSCOPY Right 02/26/2016   Procedure: ARTHROSCOPY RIGHT KNEE WITH MENSICAL DEBRIDEMENT;  Surgeon: Gaynelle Arabian, MD;  [facility];  Service: Orthopedics;  Laterality: Right;   left breast biopsy     LESION REMOVAL Right 03/30/2013   Procedure: EXCISION OF NEOPLASM ARM;  Surgeon: Jamesetta So, MD;  [facility];  Service: General;  Laterality: Right;   TUBAL LIGATION      There were no vitals filed for this visit.   Subjective Assessment - 06/26/21 0831     Subjective Feeling good this morning, no reports of pain.  Stated she has started practicing balance at home, difficult.    Currently in Pain? No/denies                               [facility] Adult PT Treatment/Exercise - 06/26/21 0001       Ambulation/Gait   Stairs Yes    Stairs Assistance 5: Supervision    Stair Management Technique One rail Right;Alternating pattern    Number  of Stairs 4    Height of Stairs 7   3RT     Exercises   Exercises Ankle      Ankle Exercises: Standing   SLS BLE 10"    Heel Raises Both;15 reps    Toe Raise 15 reps    Other Standing Ankle Exercises tandem stance 3x 30" intermittent HHA    Other Standing Ankle Exercises vector stance 3x 5"; NBOS on foam with head turns, sidestep with RTB      Ankle Exercises: Seated   Other Seated Ankle Exercises STS no HHA eccentric control 10x                       PT Short Term Goals - 06/12/21 1114       PT SHORT TERM GOAL #1   Title Patient will be independent with initial HEP and self-management strategies to improve functional outcomes    Time 2    Period Weeks    Status New    Target Date 06/26/21               PT Long Term Goals - 06/12/21 1750       PT LONG TERM GOAL #1   Title Patient will  be independent with advanced HEP and self-management strategies to improve functional outcomes    Time 4    Period Weeks    Status New    Target Date 07/10/21      PT LONG TERM GOAL #2   Title Patient will report at least 70% overall improvement in subjective complaint to indicate improvement in ability to perform ADLs.    Time 4    Period Weeks    Status New    Target Date 07/10/21      PT LONG TERM GOAL #3   Title Patient will be able to walk >15 minutes with no increased ankle pain to demo improved gait mechanics and improved functional ability    Time 4    Period Weeks    Status New    Target Date 07/10/21                   Plan - 06/26/21 0855     Clinical Impression Statement Progressed functional strengthening and balance training that was tolerated well.  Added reciprocal pattern stairs with cueing to reduce rotation descending stairs for ankle mobility.  Continued balance activities that were most challenging, required HHA.  EOS with manual to address soft tissue restrictions, tenderness with palpation of posterior tib.    Examination-Activity Limitations Stairs;Stand;Transfers;Locomotion Level    Examination-Participation Restrictions Cleaning;Community Activity;Shop;Laundry;Yard Work    Stability/Clinical Decision Making Stable/Uncomplicated    Designer, jewellery Low    Rehab Potential Good    PT Frequency 2x / week    PT Duration 4 weeks    PT Treatment/Interventions ADLs/Self Care Home Management;Biofeedback;Cryotherapy;Electrical Stimulation;Iontophoresis 4mg /ml Dexamethasone;Traction;Moist Heat;Ultrasound;Parrafin;Fluidtherapy;Contrast Bath;DME Instruction;Gait training;Stair training;Functional mobility training;Therapeutic activities;Therapeutic exercise;Balance training;Manual techniques;Vasopneumatic Device;Taping;Splinting;Energy conservation;Orthotic Fit/Training;Dry needling;Passive range of motion;Patient/family education;Cognitive  remediation;Neuromuscular re-education;Compression bandaging;Scar mobilization;Visual/perceptual remediation/compensation;Spinal Manipulations;Joint Manipulations    PT Next Visit Plan Progress ankle strength, stability and balalnce.  Target manual therapy to achilles and posterior tib.    PT Home Exercise Plan Eval: ankle band PF/INV/EV; 9/27: heel raise and gastroc stretch at wall.; 9/30: SLS and tandem stance by counter    Consulted and Agree with Plan of Care Patient             Patient  will benefit from skilled therapeutic intervention in order to improve the following deficits and impairments:  Abnormal gait, Decreased strength, Increased fascial restricitons, Pain, Decreased activity tolerance, Decreased mobility, Difficulty walking, Decreased range of motion, Impaired flexibility  Visit Diagnosis: Pain in left ankle and joints of left foot  Difficulty in walking, not elsewhere classified     Problem List Patient Active Problem List   Diagnosis Date Noted   CKD (chronic kidney disease) stage 3, GFR 30-59 ml/min (HCC) 05/29/2021   Chronic heel pain, left 01/22/2021   Allergic reaction to drug 07/12/2020   Patellofemoral arthralgia of both knees 12/07/2019   Diverticulosis of colon 06/14/2019   Pancreatic lesion 06/14/2019   Obese 02/06/2019   Thoracic back pain 11/08/2018   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   Thrombocytopenia ([place]) 06/11/2018   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   Back pain with right-sided radiculopathy 04/28/2018   Closed fracture of phalanx of foot 11/28/2017   Impingement syndrome of left shoulder region 11/09/2017   Chronic left SI joint pain 05/21/2016   Primary osteoarthritis of both knees 06/26/2015   Papilloma of right breast 09/29/2014   Thoracic spine pain 09/28/2014   Hip pain 08/06/2012   Cervical neck pain with evidence of disc disease 12/17/2011   Hot flashes, menopausal 09/10/2011   Type 2 diabetes mellitus with  vascular disease ([place]) 01/08/2010   INSOMNIA, CHRONIC 11/28/2009   Hyperlipidemia with target LDL less than 100 10/12/2007   ESSENTIAL HYPERTENSION, BENIGN 10/12/2007   Ihor Austin, LPTA/CLT; CBIS [phone]  Aldona Lento, PTA 06/26/2021, 9:16 AM  [place] [address] [place], [place], [address] Phone: [phone]   Fax:  [phone]  Name: Jillian [name] MRN: [id number] Date of Birth: [birth year]

## 2021-07-01 ENCOUNTER — Encounter (HOSPITAL_COMMUNITY): Payer: Medicare HMO | Admitting: Physical Therapy

## 2021-07-03 ENCOUNTER — Encounter (HOSPITAL_COMMUNITY): Payer: Medicare HMO | Admitting: Physical Therapy

## 2021-07-17 ENCOUNTER — Ambulatory Visit: Payer: Medicare HMO | Admitting: Family Medicine

## 2021-07-18 ENCOUNTER — Ambulatory Visit: Payer: Medicare HMO | Admitting: Family Medicine

## 2021-08-06 ENCOUNTER — Ambulatory Visit (INDEPENDENT_AMBULATORY_CARE_PROVIDER_SITE_OTHER): Payer: Medicare HMO | Admitting: Family Medicine

## 2021-08-06 ENCOUNTER — Other Ambulatory Visit: Payer: Self-pay

## 2021-08-06 ENCOUNTER — Encounter: Payer: Self-pay | Admitting: Family Medicine

## 2021-08-06 VITALS — BP 124/82 | HR 78 | Resp 17 | Ht 64.0 in | Wt 189.1 lb

## 2021-08-06 DIAGNOSIS — E1159 Type 2 diabetes mellitus with other circulatory complications: Secondary | ICD-10-CM

## 2021-08-06 DIAGNOSIS — I10 Essential (primary) hypertension: Secondary | ICD-10-CM | POA: Diagnosis not present

## 2021-08-06 DIAGNOSIS — E785 Hyperlipidemia, unspecified: Secondary | ICD-10-CM

## 2021-08-06 DIAGNOSIS — E559 Vitamin D deficiency, unspecified: Secondary | ICD-10-CM

## 2021-08-06 DIAGNOSIS — Z0000 Encounter for general adult medical examination without abnormal findings: Secondary | ICD-10-CM

## 2021-08-06 NOTE — Assessment & Plan Note (Signed)
Jillian [name] is reminded of the importance of commitment to daily physical activity for 30 minutes or more, as able and the need to limit carbohydrate intake to 30 to 60 grams per meal to help with blood sugar control.   The need to take medication as prescribed, test blood sugar as directed, and to call between visits if there is a concern that blood sugar is uncontrolled is also discussed.   Jillian [name] is reminded of the importance of daily foot exam, annual eye examination, and good blood sugar, blood pressure and cholesterol control.  Diabetic Labs Latest Ref Rng & Units 04/24/2021 02/06/2021 11/26/2020 07/09/2020 07/03/2020  HbA1c 4.8 - 5.6 % 6.9(H) - 6.7(H) 6.3(A) -  Microalbumin Not Estab. ug/mL 10.1(H) - - - -  Micro/Creat Ratio 0 - 29 mg/g creat 5 - - - -  Chol 0 - 200 mg/dL 128 - 150 - 141  HDL >40 mg/dL 45 - 52 - 49(L)  Calc LDL 0 - 99 mg/dL 62 - 79 - 72  Triglycerides <150 mg/dL 103 - 105 - 118  Creatinine 0.44 - 1.00 mg/dL 1.16(H) 1.00 1.03(H) 0.90 -   BP/Weight 08/06/2021 05/29/2021 05/20/2021 04/02/2021 02/13/2021 02/12/2021 09/23/5850  Systolic BP 778 242 353 614 431 540 086  Diastolic BP 82 82 82 73 77 74 86  Wt. (Lbs) 189.12 192.12 - 193 191 191 191  BMI 32.46 32.98 - 33.13 32.79 32.79 32.79   Foot/eye exam completion dates Latest Ref Rng & Units 08/06/2021 06/20/2020  Eye Exam No Retinopathy - No Retinopathy  Foot exam Order - - -  Foot Form Completion - Done -

## 2021-08-06 NOTE — Assessment & Plan Note (Signed)

## 2021-08-06 NOTE — Patient Instructions (Addendum)
F/U in 4 months, call if you need me sooner  Lab today, HBA1c, chem 7 and EGFr and CBC  Please schedule May mammogram at checkout   Fasting lipid, cmp and EGFr, HBA1C, and Vit D 5 to 7 days before April appointment  Please get yo our Covid booster today  Please arrange for your shingrix #2 before year end  It is important that you exercise regularly at least 30 minutes 5 times a week. If you develop chest pain, have severe difficulty breathing, or feel very tired, stop exercising immediately and seek medical attention   Think about what you will eat, plan ahead. Choose " clean, green, fresh or frozen" over canned, processed or packaged foods which are more sugary, salty and fatty. 70 to 75% of food eaten should be vegetables and fruit. Three meals at set times with snacks allowed between meals, but they must be fruit or vegetables. Aim to eat over a 12 hour period , example 7 am to 7 pm, and STOP after  your last meal of the day. Drink water,generally about 64 ounces per day, no other drink is as healthy. Fruit juice is best enjoyed in a healthy way, by EATING the fruit.  Thanks for choosing [facility], we consider it a privelige to serve you.

## 2021-08-06 NOTE — Progress Notes (Signed)
Jillian [name]     MRN: [id number]      DOB: November 20, 1940  HPI: Patient is in for annual physical exam. No other health concerns are expressed or addressed at the visit. Recent labs,  are reviewed. Immunization is reviewed , and  updated if needed.   PE: BP 124/82   Pulse 78   Resp 17   Ht 5\' 4"  (1.626 m)   Wt 189 lb 1.9 oz (85.8 kg)   SpO2 95%   BMI 32.46 kg/m   Pleasant  female, alert and oriented x 3, in no cardio-pulmonary distress. Afebrile. HEENT No facial trauma or asymetry. Sinuses non tender.  Extra occullar muscles intact.. External ears normal, . Neck: supple, no adenopathy,JVD or thyromegaly.No bruits.  Chest: Clear to ascultation bilaterally.No crackles or wheezes. Non tender to palpation  Breast: No asymetry,no masses or lumps. No tenderness. No nipple discharge or inversion. No axillary or supraclavicular adenopathy  Cardiovascular system; Heart sounds normal,  S1 and  S2 ,no S3.  No murmur, or thrill. Apical beat not displaced Peripheral pulses normal.  Abdomen: Soft, non tender, no organomegaly or masses. No bruits. Bowel sounds normal. No guarding, tenderness or rebound.      Musculoskeletal exam: Full ROM of spine, hips , shoulders and knees. No deformity ,swelling or crepitus noted. No muscle wasting or atrophy.   Neurologic: Cranial nerves 2 to 12 intact. Power, tone ,sensation and reflexes normal throughout. No disturbance in gait. No tremor.  Skin: Intact, no ulceration, erythema , scaling or rash noted. Pigmentation normal throughout  Psych; Normal mood and affect. Judgement and concentration normal   Annual physical exam Annual exam as documented. Counseling done  re healthy lifestyle involving commitment to 150 minutes exercise per week, heart healthy diet, and attaining healthy weight.The importance of adequate sleep also discussed. Regular seat belt use and home safety, is also discussed. Changes in health habits  are decided on by the patient with goals and time frames  set for achieving them. Immunization and cancer screening needs are specifically addressed at this visit.   Type 2 diabetes mellitus with vascular disease ([place]) Jillian [name] is reminded of the importance of commitment to daily physical activity for 30 minutes or more, as able and the need to limit carbohydrate intake to 30 to 60 grams per meal to help with blood sugar control.   The need to take medication as prescribed, test blood sugar as directed, and to call between visits if there is a concern that blood sugar is uncontrolled is also discussed.   Jillian [name] is reminded of the importance of daily foot exam, annual eye examination, and good blood sugar, blood pressure and cholesterol control.  Diabetic Labs Latest Ref Rng & Units 04/24/2021 02/06/2021 11/26/2020 07/09/2020 07/03/2020  HbA1c 4.8 - 5.6 % 6.9(H) - 6.7(H) 6.3(A) -  Microalbumin Not Estab. ug/mL 10.1(H) - - - -  Micro/Creat Ratio 0 - 29 mg/g creat 5 - - - -  Chol 0 - 200 mg/dL 128 - 150 - 141  HDL >40 mg/dL 45 - 52 - 49(L)  Calc LDL 0 - 99 mg/dL 62 - 79 - 72  Triglycerides <150 mg/dL 103 - 105 - 118  Creatinine 0.44 - 1.00 mg/dL 1.16(H) 1.00 1.03(H) 0.90 -   BP/Weight 08/06/2021 05/29/2021 05/20/2021 04/02/2021 02/13/2021 02/12/2021 6/96/2952  Systolic BP 841 324 401 027 253 664 403  Diastolic BP 82 82 82 73 77 74 86  Wt. (Lbs) 189.12 192.12 - 193  191 191 191  BMI 32.46 32.98 - 33.13 32.79 32.79 32.79   Foot/eye exam completion dates Latest Ref Rng & Units 08/06/2021 06/20/2020  Eye Exam No Retinopathy - No Retinopathy  Foot exam Order - - -  Foot Form Completion - Done -

## 2021-08-07 ENCOUNTER — Other Ambulatory Visit: Payer: Self-pay | Admitting: Family Medicine

## 2021-08-07 DIAGNOSIS — N1831 Chronic kidney disease, stage 3a: Secondary | ICD-10-CM

## 2021-08-07 LAB — BMP8+EGFR
BUN/Creatinine Ratio: 8 — ABNORMAL LOW (ref 12–28)
BUN: 9 mg/dL (ref 8–27)
CO2: 23 mmol/L (ref 20–29)
Calcium: 9.6 mg/dL (ref 8.7–10.3)
Chloride: 107 mmol/L — ABNORMAL HIGH (ref 96–106)
Creatinine, Ser: 1.19 mg/dL — ABNORMAL HIGH (ref 0.57–1.00)
Glucose: 112 mg/dL — ABNORMAL HIGH (ref 70–99)
Potassium: 4.6 mmol/L (ref 3.5–5.2)
Sodium: 144 mmol/L (ref 134–144)
eGFR: 47 mL/min/{1.73_m2} — ABNORMAL LOW (ref >59)

## 2021-08-07 LAB — CBC
Hematocrit: 39.4 % (ref 34.0–46.6)
Hemoglobin: 12.7 g/dL (ref 11.1–15.9)
MCH: 29.3 pg (ref 26.6–33.0)
MCHC: 32.2 g/dL (ref 31.5–35.7)
MCV: 91 fL (ref 79–97)
Platelets: 188 10*3/uL (ref 150–450)
RBC: 4.33 x10E6/uL (ref 3.77–5.28)
RDW: 14.7 % (ref 11.7–15.4)
WBC: 5.6 10*3/uL (ref 3.4–10.8)

## 2021-08-07 LAB — HEMOGLOBIN A1C
Est. average glucose Bld gHb Est-mCnc: 148 mg/dL
Hgb A1c MFr Bld: 6.8 % — ABNORMAL HIGH (ref 4.8–5.6)

## 2021-08-12 ENCOUNTER — Telehealth: Payer: Self-pay | Admitting: Family Medicine

## 2021-08-12 ENCOUNTER — Telehealth: Payer: Self-pay

## 2021-08-12 NOTE — Telephone Encounter (Signed)
Patient son called said Dr Theador Hawthorne, [facility] never received this referral.  He called to setup an appointment and told him they have not received referral yet.

## 2021-08-12 NOTE — Telephone Encounter (Signed)
Son Jillian [name] called in on pt behalf;lf in regard to Nephrology referral  Son states that he spoke with Nephrology office and they have not yet received the referral for [name]    Also states they (neph office)  requested reach out to them in regards to the ref .  Possible fax # change

## 2021-08-16 ENCOUNTER — Ambulatory Visit
Admission: EM | Admit: 2021-08-16 | Discharge: 2021-08-16 | Disposition: A | Discharge status: Home / Self Care | Payer: Medicare HMO | Attending: Urgent Care | Admitting: Urgent Care

## 2021-08-16 ENCOUNTER — Ambulatory Visit (INDEPENDENT_AMBULATORY_CARE_PROVIDER_SITE_OTHER): Payer: Medicare HMO

## 2021-08-16 ENCOUNTER — Other Ambulatory Visit: Payer: Self-pay

## 2021-08-16 ENCOUNTER — Telehealth: Payer: Self-pay

## 2021-08-16 ENCOUNTER — Encounter: Payer: Self-pay | Admitting: Emergency Medicine

## 2021-08-16 DIAGNOSIS — N183 Chronic kidney disease, stage 3 unspecified: Secondary | ICD-10-CM

## 2021-08-16 DIAGNOSIS — E119 Type 2 diabetes mellitus without complications: Secondary | ICD-10-CM

## 2021-08-16 DIAGNOSIS — R0789 Other chest pain: Secondary | ICD-10-CM | POA: Diagnosis not present

## 2021-08-16 DIAGNOSIS — I10 Essential (primary) hypertension: Secondary | ICD-10-CM

## 2021-08-16 MED ORDER — FAMOTIDINE 20 MG PO TABS: 20.0000 mg | ORAL_TABLET | Freq: Two times a day (BID) | ORAL | 0 refills | Status: DC

## 2021-08-16 NOTE — ED Provider Notes (Signed)
[place]   MRN: [id number] DOB: 05-20-1941  Subjective:   Jillian [name] is a 80 y.o. female presenting for 3 to 4-day history of acute onset midsternal chest discomfort worse when she lays down or takes a deep breath.  No fever, runny or stuffy nose, sore throat, cough, chest pain, shortness of breath, wheezing, nausea, vomiting, abdominal pain.  No history of heart disease, MI, GERD.  She does have a history of CKD and is awaiting referral to a nephrologist.  No body aches.  Patient is not a smoker.  Has not done any kind of heavy lifting, strenuous physical activities.  No trauma to the chest.  No current facility-administered medications for this encounter.  Current Outpatient Medications:    acetaminophen (TYLENOL) 500 MG tablet, Take 1,000 mg by mouth every 6 (six) hours as needed for mild pain., Disp: , Rfl:    aspirin EC 81 MG tablet, Take 1 tablet (81 mg total) by mouth every evening. (Patient taking differently: Take 81 mg by mouth at bedtime.), Disp: 90 tablet, Rfl: 1   benazepril (LOTENSIN) 5 MG tablet, Take 1 tablet (5 mg total) by mouth daily., Disp: 90 tablet, Rfl: 3   Blood Glucose Monitoring Suppl (TRUE METRIX METER) w/Device KIT, USE AS DIRECTED, Disp: 1 kit, Rfl: 0   cholecalciferol (VITAMIN D3) 25 MCG (1000 UNIT) tablet, Take 2 tablets (2,000 Units total) by mouth daily., Disp: 180 tablet, Rfl: 3   cloNIDine (CATAPRES) 0.3 MG tablet, TAKE 1 TABLET BY MOUTH EVERY EVENING AT 8 PM, Disp: 30 tablet, Rfl: 0   glucose blood test strip, TRUE METRIX GLUCOSE BLOOD TEST STRIP, Use as instructed to check blood glucose once daily, Disp: 100 each, Rfl: 0   linagliptin (TRADJENTA) 5 MG TABS tablet, Take 1 tablet (5 mg total) by mouth daily., Disp: 90 tablet, Rfl: 2   pravastatin (PRAVACHOL) 10 MG tablet, TAKE 1 TABLET EVERY DAY, Disp: 90 tablet, Rfl: 0   vitamin B-12 (CYANOCOBALAMIN) 1000 MCG tablet, TAKE 1 TABLET (1,000 MCG TOTAL) BY MOUTH EVERY MONDAY, WEDNESDAY,  AND FRIDAY., Disp: 39 tablet, Rfl: 3   Allergies  Allergen Reactions   Ciprofloxacin Itching   Metronidazole Itching   Codeine Nausea And Vomiting   Propoxyphene    Propoxyphene N-Acetaminophen Other (See Comments)    hallucinations    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   [facility]ospitalized at [facility]   Hypertension 2005   Mild obesity    Postmenopausal    Rectal bleeding    Viral meningitis    Wears glasses      Past Surgical History:  Procedure Laterality Date   ABDOMINAL HYSTERECTOMY  1992   fibroids   APPENDECTOMY     BREAST EXCISIONAL BIOPSY Right    benign   BREAST LUMPECTOMY WITH RADIOACTIVE SEED LOCALIZATION Right 10/05/2014   Procedure: BREAST LUMPECTOMY WITH RADIOACTIVE SEED LOCALIZATION;  Surgeon: Erroll Luna, MD;  Location: [place];  Service: General;  Laterality: Right;   cataract Bilateral    COLONOSCOPY  2013   Dr. Oneida Alar :  internal hemorrhoids, mild left-sided diverticulosis   COLONOSCOPY N/A 09/21/2019   Procedure: COLONOSCOPY;  Surgeon: Danie Binder, MD;  [facility];  Service: Endoscopy;  Laterality: N/A;  12:00pm   CYST EXCISION Right    arm   ESOPHAGOGASTRODUODENOSCOPY  2013   Dr. Oneida Alar: gastritis likely NSAID-induced. Duodenal polyp. Duodenitis and gastritis in setting of aspirin and Ibuprofen.    EYE SURGERY Right 06/07/2013   cataract   EYE SURGERY Left 09/14/2013   cataract   KNEE ARTHROSCOPY Right 02/26/2016   Procedure: ARTHROSCOPY RIGHT KNEE WITH MENSICAL DEBRIDEMENT;  Surgeon: Gaynelle Arabian, MD;  Location: [facility];  Service: Orthopedics;  Laterality: Right;   left breast biopsy     LESION REMOVAL Right 03/30/2013   Procedure: EXCISION OF NEOPLASM ARM;  Surgeon: Jamesetta So, MD;   Location: [facility];  Service: General;  Laterality: Right;   TUBAL LIGATION      Family History  Problem Relation Age of Onset   Hypertension Father    Congestive Heart Failure Father    Liver cancer Mother    Hypertension Sister    Hypertension Brother    Diabetes Brother    Colon cancer Neg Hx    Colon polyps Neg Hx     Social History   Tobacco Use   Smoking status: Never   Smokeless tobacco: Never  Vaping Use   Vaping Use: Never used  Substance Use Topics   Alcohol use: No   Drug use: No    ROS   Objective:   Vitals: BP 131/80   Pulse 73   Temp 98.4 F (36.9 C) (Oral)   Resp 16   SpO2 97%   Physical Exam Constitutional:      General: She is not in acute distress.    Appearance: Normal appearance. She is well-developed. She is not ill-appearing, toxic-appearing or diaphoretic.  HENT:     Head: Normocephalic and atraumatic.     Nose: Nose normal.     Mouth/Throat:     Mouth: Mucous membranes are moist.     Pharynx: Oropharynx is clear.  Eyes:     General: No scleral icterus.       Right eye: No discharge.        Left eye: No discharge.     Extraocular Movements: Extraocular movements intact.     Conjunctiva/sclera: Conjunctivae normal.     Pupils: Pupils are equal, round, and reactive to light.  Cardiovascular:     Rate and Rhythm: Normal rate and regular rhythm.     Pulses: Normal pulses.     Heart sounds: Normal heart sounds. No murmur heard.   No friction rub. No gallop.  Pulmonary:     Effort: Pulmonary effort is normal. No respiratory distress.     Breath sounds: Normal breath sounds. No stridor. No wheezing, rhonchi or rales.  Chest:     Chest wall: No tenderness.  Abdominal:     General: Bowel sounds are normal. There is no distension.     Palpations: Abdomen is soft. There is no mass.     Tenderness: There is no abdominal tenderness. There is no right CVA tenderness, left CVA tenderness, guarding or rebound.  Skin:    General: Skin is  warm and dry.     Findings: No rash.  Neurological:     General: No focal deficit present.     Mental Status: She is alert and oriented to person, place, and time.  Psychiatric:        Mood and Affect: Mood normal.        Behavior: Behavior normal.  Thought Content: Thought content normal.        Judgment: Judgment normal.    ED ECG REPORT   Date: 08/16/2021  EKG Time: 10:43 AM  Rate: 67 bpm  Rhythm: normal sinus rhythm,  normal EKG, normal sinus rhythm, unchanged from previous tracings  Axis: Left  Intervals:none  ST&T Change: Nonspecific T wave inversion in lead III, T wave flattening in aVF, V5, V6.  Narrative Interpretation: Sinus rhythm at 67 bpm with nonspecific T wave changes as above, completely unchanged from previous EKG.  DG Chest 2 View  Result Date: 08/16/2021 CLINICAL DATA:  Atypical chest pain EXAM: CHEST - 2 VIEW COMPARISON:  04/01/2020 FINDINGS: The heart size and mediastinal contours are within normal limits. Both lungs are clear. Disc degenerative disease of the thoracic spine. IMPRESSION: No acute abnormality of the lungs. Electronically Signed   By: Delanna Ahmadi M.D.   On: 08/16/2021 10:33     Assessment and Plan :   PDMP not reviewed this encounter.  1. Chest discomfort   2. Type 2 diabetes mellitus treated without insulin ([place])   3. Essential hypertension   4. Stage 3 chronic kidney disease, unspecified whether stage 3a or 3b CKD ([place])    Vital signs hemodynamically stable.  Physical exam findings completely reassuring.  EKG and chest x-ray are also reassuring.  Low suspicion for viral respiratory illness, deferred testing.  Recommended dietary modifications, famotidine.  Creatinine clearance calculated at 52 mL/min.  Follow-up closely with PCP. Counseled patient on potential for adverse effects with medications prescribed/recommended today, ER and return-to-clinic precautions discussed, patient verbalized understanding.    Jaynee Eagles,  PA-C 08/16/21 1135

## 2021-08-16 NOTE — Telephone Encounter (Signed)
Pt calls states she has been having chest heaviness for two days just something she has never felt.  I recommended she go to urgent care or ER for this.  She is on the way to the urgent care now.

## 2021-08-16 NOTE — ED Triage Notes (Signed)
PT reports for 3-4 days she had an "uncomfortable" feeling in central chest. Does not radiate, denies pain. PT reports she notices feeling when laying down and when taking deep breaths.

## 2021-08-23 ENCOUNTER — Other Ambulatory Visit: Payer: Self-pay | Admitting: Family Medicine

## 2021-08-31 ENCOUNTER — Other Ambulatory Visit: Payer: Self-pay | Admitting: Family Medicine

## 2021-09-02 DIAGNOSIS — M170 Bilateral primary osteoarthritis of knee: Secondary | ICD-10-CM | POA: Diagnosis not present

## 2021-09-02 DIAGNOSIS — M1712 Unilateral primary osteoarthritis, left knee: Secondary | ICD-10-CM | POA: Diagnosis not present

## 2021-09-09 ENCOUNTER — Other Ambulatory Visit: Payer: Self-pay | Admitting: Nephrology

## 2021-09-09 ENCOUNTER — Other Ambulatory Visit (HOSPITAL_COMMUNITY): Payer: Self-pay | Admitting: Nephrology

## 2021-09-09 DIAGNOSIS — I129 Hypertensive chronic kidney disease with stage 1 through stage 4 chronic kidney disease, or unspecified chronic kidney disease: Secondary | ICD-10-CM | POA: Diagnosis not present

## 2021-09-09 DIAGNOSIS — E1122 Type 2 diabetes mellitus with diabetic chronic kidney disease: Secondary | ICD-10-CM

## 2021-09-09 DIAGNOSIS — E559 Vitamin D deficiency, unspecified: Secondary | ICD-10-CM

## 2021-09-09 DIAGNOSIS — N189 Chronic kidney disease, unspecified: Secondary | ICD-10-CM | POA: Diagnosis not present

## 2021-09-09 DIAGNOSIS — Z6832 Body mass index (BMI) 32.0-32.9, adult: Secondary | ICD-10-CM | POA: Diagnosis not present

## 2021-09-11 DIAGNOSIS — Z79899 Other long term (current) drug therapy: Secondary | ICD-10-CM | POA: Diagnosis not present

## 2021-09-11 DIAGNOSIS — Z6832 Body mass index (BMI) 32.0-32.9, adult: Secondary | ICD-10-CM | POA: Diagnosis not present

## 2021-09-11 DIAGNOSIS — E559 Vitamin D deficiency, unspecified: Secondary | ICD-10-CM | POA: Diagnosis not present

## 2021-09-11 DIAGNOSIS — I129 Hypertensive chronic kidney disease with stage 1 through stage 4 chronic kidney disease, or unspecified chronic kidney disease: Secondary | ICD-10-CM | POA: Diagnosis not present

## 2021-09-11 DIAGNOSIS — N189 Chronic kidney disease, unspecified: Secondary | ICD-10-CM | POA: Diagnosis not present

## 2021-09-11 DIAGNOSIS — E1122 Type 2 diabetes mellitus with diabetic chronic kidney disease: Secondary | ICD-10-CM | POA: Diagnosis not present

## 2021-09-16 ENCOUNTER — Other Ambulatory Visit: Payer: Self-pay | Admitting: Family Medicine

## 2021-09-16 DIAGNOSIS — E1159 Type 2 diabetes mellitus with other circulatory complications: Secondary | ICD-10-CM

## 2021-09-18 ENCOUNTER — Ambulatory Visit (HOSPITAL_COMMUNITY)
Admission: RE | Admit: 2021-09-18 | Discharge: 2021-09-18 | Disposition: A | Discharge status: Home / Self Care | Payer: Medicare HMO | Source: Ambulatory Visit | Attending: Nephrology | Admitting: Nephrology

## 2021-09-18 ENCOUNTER — Other Ambulatory Visit: Payer: Self-pay

## 2021-09-18 DIAGNOSIS — N281 Cyst of kidney, acquired: Secondary | ICD-10-CM | POA: Diagnosis not present

## 2021-09-18 DIAGNOSIS — E1122 Type 2 diabetes mellitus with diabetic chronic kidney disease: Secondary | ICD-10-CM | POA: Diagnosis not present

## 2021-09-18 DIAGNOSIS — E559 Vitamin D deficiency, unspecified: Secondary | ICD-10-CM | POA: Diagnosis not present

## 2021-09-18 DIAGNOSIS — I129 Hypertensive chronic kidney disease with stage 1 through stage 4 chronic kidney disease, or unspecified chronic kidney disease: Secondary | ICD-10-CM | POA: Diagnosis not present

## 2021-09-18 DIAGNOSIS — E119 Type 2 diabetes mellitus without complications: Secondary | ICD-10-CM | POA: Diagnosis not present

## 2021-09-18 DIAGNOSIS — N189 Chronic kidney disease, unspecified: Secondary | ICD-10-CM | POA: Diagnosis not present

## 2021-10-02 ENCOUNTER — Encounter (HOSPITAL_COMMUNITY): Payer: Self-pay | Admitting: Physical Therapy

## 2021-10-02 NOTE — Therapy (Signed)
[place], [place], [address] Phone: [phone]   Fax:  [phone]  Patient Details  Name: Jillian [name] MRN: [id number] Date of Birth: 1940/09/30 Referring Provider:  No ref. provider found  Encounter Date: 10/02/2021  PHYSICAL THERAPY DISCHARGE SUMMARY  Visits from Start of Care: 4  Current functional level related to goals / functional outcomes: NA   Remaining deficits: NA   Education / Equipment: Patient DC per non return    Patient agrees to discharge. Patient goals were not met. Patient is being discharged due to not returning since the last visit.  12:01 PM, 10/02/21 Josue Hector PT DPT  Physical Therapist with [facility]  [phone] [place] [address] [place], [place], [address] Phone: [phone]   Fax:  [phone]

## 2021-10-11 ENCOUNTER — Other Ambulatory Visit: Payer: Self-pay

## 2021-10-11 ENCOUNTER — Telehealth: Payer: Self-pay

## 2021-10-11 MED ORDER — CLONIDINE HCL 0.3 MG PO TABS: ORAL_TABLET | ORAL | 2 refills | Status: DC

## 2021-10-11 MED ORDER — LINAGLIPTIN 5 MG PO TABS: 5.0000 mg | ORAL_TABLET | Freq: Every day | ORAL | 2 refills | Status: DC

## 2021-10-11 NOTE — Telephone Encounter (Signed)
Refills sent

## 2021-10-11 NOTE — Telephone Encounter (Signed)
Patient called need refills and would like sent to [facility].  Tradjenta 5 mg  Clonidine 0.3 mg   Pharmacy: [facility] # [phone]  FUTURE: SEND ALL MEDS TO [facility] THEIR PHARMACY

## 2021-10-16 DIAGNOSIS — E1122 Type 2 diabetes mellitus with diabetic chronic kidney disease: Secondary | ICD-10-CM | POA: Diagnosis not present

## 2021-10-16 DIAGNOSIS — D472 Monoclonal gammopathy: Secondary | ICD-10-CM | POA: Diagnosis not present

## 2021-10-16 DIAGNOSIS — N189 Chronic kidney disease, unspecified: Secondary | ICD-10-CM | POA: Diagnosis not present

## 2021-10-16 DIAGNOSIS — N281 Cyst of kidney, acquired: Secondary | ICD-10-CM | POA: Diagnosis not present

## 2021-10-16 DIAGNOSIS — I129 Hypertensive chronic kidney disease with stage 1 through stage 4 chronic kidney disease, or unspecified chronic kidney disease: Secondary | ICD-10-CM | POA: Diagnosis not present

## 2021-10-22 ENCOUNTER — Telehealth: Payer: Self-pay

## 2021-10-22 NOTE — Telephone Encounter (Signed)
Patient called need all medicine sent to [facility].

## 2021-10-23 ENCOUNTER — Other Ambulatory Visit: Payer: Self-pay

## 2021-10-23 MED ORDER — BENAZEPRIL HCL 5 MG PO TABS: 5.0000 mg | ORAL_TABLET | Freq: Every day | ORAL | 1 refills | Status: DC

## 2021-10-23 MED ORDER — PRAVASTATIN SODIUM 10 MG PO TABS: 10.0000 mg | ORAL_TABLET | Freq: Every day | ORAL | 1 refills | Status: DC

## 2021-10-23 MED ORDER — CLONIDINE HCL 0.3 MG PO TABS: ORAL_TABLET | ORAL | 1 refills | Status: DC

## 2021-10-23 MED ORDER — LINAGLIPTIN 5 MG PO TABS: 5.0000 mg | ORAL_TABLET | Freq: Every day | ORAL | 1 refills | Status: DC

## 2021-10-23 NOTE — Telephone Encounter (Signed)
Refilled

## 2021-11-25 ENCOUNTER — Other Ambulatory Visit: Payer: Self-pay

## 2021-11-25 ENCOUNTER — Telehealth: Payer: Self-pay | Admitting: Family Medicine

## 2021-11-25 MED ORDER — VITAMIN D 25 MCG (1000 UNIT) PO TABS: 2000.0000 [IU] | ORAL_TABLET | Freq: Every day | ORAL | 3 refills | Status: DC

## 2021-11-25 NOTE — Telephone Encounter (Signed)
Patient needs refill on ? ? ?cholecalciferol (VITAMIN D3) 25 MCG (1000 UNIT) tablet  ?

## 2021-11-25 NOTE — Telephone Encounter (Signed)
Refill sent.

## 2021-12-03 DIAGNOSIS — Z961 Presence of intraocular lens: Secondary | ICD-10-CM | POA: Diagnosis not present

## 2021-12-03 DIAGNOSIS — H524 Presbyopia: Secondary | ICD-10-CM | POA: Diagnosis not present

## 2021-12-03 LAB — HM DIABETES EYE EXAM

## 2021-12-11 ENCOUNTER — Telehealth: Payer: Self-pay

## 2021-12-11 ENCOUNTER — Other Ambulatory Visit: Payer: Self-pay

## 2021-12-11 MED ORDER — VITAMIN D 25 MCG (1000 UNIT) PO TABS: 2000.0000 [IU] | ORAL_TABLET | Freq: Every day | ORAL | 3 refills | Status: DC

## 2021-12-11 MED ORDER — PRAVASTATIN SODIUM 10 MG PO TABS: 10.0000 mg | ORAL_TABLET | Freq: Every day | ORAL | 1 refills | Status: DC

## 2021-12-11 NOTE — Telephone Encounter (Signed)
Patient called need med refill ? ?cholecalciferol (VITAMIN D3) 25 MCG (1000 UNIT) tablet  ? ?pravastatin (PRAVACHOL) 10 MG tablet ? ? ?Pharmacy: [facility]  ? ?Future patient will be using the [facility] ?

## 2021-12-11 NOTE — Telephone Encounter (Signed)
Med refilled.

## 2021-12-18 ENCOUNTER — Telehealth: Payer: Self-pay

## 2021-12-18 ENCOUNTER — Other Ambulatory Visit: Payer: Self-pay

## 2021-12-18 MED ORDER — PRAVASTATIN SODIUM 10 MG PO TABS: 10.0000 mg | ORAL_TABLET | Freq: Every day | ORAL | 1 refills | Status: DC

## 2021-12-18 MED ORDER — VITAMIN D 25 MCG (1000 UNIT) PO TABS: 2000.0000 [IU] | ORAL_TABLET | Freq: Every day | ORAL | 3 refills | Status: DC

## 2021-12-18 NOTE — Telephone Encounter (Signed)
Meds refilled [facility]  ? ?

## 2021-12-18 NOTE — Telephone Encounter (Signed)
Patient called meds were sent to the wrong pharmacy. ?pravastatin (PRAVACHOL) 10 MG tablet  ? ? ?cholecalciferol (VITAMIN D3) 25 MCG (1000 UNIT) tablet ? ?Needs to send to [facility]  ?

## 2021-12-20 ENCOUNTER — Telehealth: Payer: Self-pay

## 2021-12-20 ENCOUNTER — Other Ambulatory Visit: Payer: Self-pay

## 2021-12-20 MED ORDER — LINAGLIPTIN 5 MG PO TABS: 5.0000 mg | ORAL_TABLET | Freq: Every day | ORAL | 1 refills | Status: DC

## 2021-12-20 NOTE — Telephone Encounter (Signed)
Pt called in to have a refill on her trajenta sent to [facility]. Rx sent  ?

## 2021-12-25 ENCOUNTER — Telehealth: Payer: Self-pay

## 2021-12-25 ENCOUNTER — Other Ambulatory Visit: Payer: Self-pay

## 2021-12-25 MED ORDER — LINAGLIPTIN 5 MG PO TABS: 5.0000 mg | ORAL_TABLET | Freq: Every day | ORAL | 0 refills | Status: DC

## 2021-12-25 NOTE — Telephone Encounter (Signed)
Patient called said she call her [facility] mail order and said will not get this medicine Tradjenta til 04.14.2023.  Can a 14 day supply of Tradjenta be called into [facility] on [address]. ? ? ?

## 2021-12-25 NOTE — Telephone Encounter (Signed)
Came by the office and said she talked with [facility] and they won't be able to fill it until 4/15 and she wants to make sure that is not too long to go without it? Please advise  ?

## 2021-12-25 NOTE — Telephone Encounter (Signed)
Lvm with recommendation  ?

## 2021-12-25 NOTE — Telephone Encounter (Signed)
Pharmacy called the patient and the patient said that this 14 day supply medicine cost $300.00, she is completely out and needs to know what should she do. Please return patient call back at [phone] ?

## 2021-12-25 NOTE — Telephone Encounter (Signed)
Med refilled.

## 2021-12-25 NOTE — Telephone Encounter (Signed)
She is waiting on her shipment from [facility] and ran out and was going to get a few from [facility] but it will be $300. Do you want her to go back on the metformin until she gets the tradgenta? Please advise  ?

## 2021-12-31 ENCOUNTER — Ambulatory Visit (INDEPENDENT_AMBULATORY_CARE_PROVIDER_SITE_OTHER): Payer: Medicare Other | Admitting: Family Medicine

## 2021-12-31 ENCOUNTER — Other Ambulatory Visit: Payer: Self-pay

## 2021-12-31 ENCOUNTER — Encounter: Payer: Self-pay | Admitting: Family Medicine

## 2021-12-31 VITALS — BP 129/84 | HR 70 | Ht 64.0 in | Wt 186.1 lb

## 2021-12-31 DIAGNOSIS — E1159 Type 2 diabetes mellitus with other circulatory complications: Secondary | ICD-10-CM

## 2021-12-31 DIAGNOSIS — E538 Deficiency of other specified B group vitamins: Secondary | ICD-10-CM

## 2021-12-31 DIAGNOSIS — Z6832 Body mass index (BMI) 32.0-32.9, adult: Secondary | ICD-10-CM

## 2021-12-31 DIAGNOSIS — I10 Essential (primary) hypertension: Secondary | ICD-10-CM

## 2021-12-31 DIAGNOSIS — E785 Hyperlipidemia, unspecified: Secondary | ICD-10-CM

## 2021-12-31 DIAGNOSIS — D241 Benign neoplasm of right breast: Secondary | ICD-10-CM

## 2021-12-31 DIAGNOSIS — E6609 Other obesity due to excess calories: Secondary | ICD-10-CM

## 2021-12-31 MED ORDER — VITAMIN D 25 MCG (1000 UNIT) PO TABS: 2000.0000 [IU] | ORAL_TABLET | Freq: Every day | ORAL | 3 refills | Status: DC

## 2021-12-31 MED ORDER — LINAGLIPTIN 5 MG PO TABS: 5.0000 mg | ORAL_TABLET | Freq: Every day | ORAL | 3 refills | Status: DC

## 2021-12-31 MED ORDER — VITAMIN D 25 MCG (1000 UNIT) PO TABS: 2000.0000 [IU] | ORAL_TABLET | Freq: Every day | ORAL | 3 refills | Status: AC

## 2021-12-31 NOTE — Assessment & Plan Note (Signed)
Oncology f/u in 01/2022 ?

## 2021-12-31 NOTE — Progress Notes (Unsigned)
?????? ??? ??   5? 4?? ?? ?? ?? ????? ?? ??? ????.  ?? ? ?? ???????? ???????? ??? ?? ???? ?????. ???? ?? ???! ???

## 2021-12-31 NOTE — Assessment & Plan Note (Signed)
Hyperlipidemia:Low fat diet discussed and encouraged.   Lipid Panel  Lab Results  Component Value Date   CHOL 128 04/24/2021   HDL 45 04/24/2021   LDLCALC 62 04/24/2021   TRIG 103 04/24/2021   CHOLHDL 2.8 04/24/2021     Updated lab needed  

## 2021-12-31 NOTE — Assessment & Plan Note (Signed)
Ms. Muratalla is reminded of the importance of commitment to daily physical activity for 30 minutes or more, as able and the need to limit carbohydrate intake to 30 to 60 grams per meal to help with blood sugar control.  ? ?The need to take medication as prescribed, test blood sugar as directed, and to call between visits if there is a concern that blood sugar is uncontrolled is also discussed.  ? ?Ms. Hardgrave is reminded of the importance of daily foot exam, annual eye examination, and good blood sugar, blood pressure and cholesterol control. ? ? ?  Latest Ref Rng & Units 08/06/2021  ?  9:46 AM 04/24/2021  ?  8:41 AM 02/06/2021  ?  9:05 AM 11/26/2020  ?  8:43 AM 07/09/2020  ? 11:53 AM  ?Diabetic Labs  ?HbA1c 4.8 - 5.6 % 6.8   6.9    6.7   6.3    ?Microalbumin Not Estab. ug/mL  10.1       ?Micro/Creat Ratio 0 - 29 mg/g creat  5       ?Chol 0 - 200 mg/dL  128    150     ?HDL >40 mg/dL  45    52     ?Calc LDL 0 - 99 mg/dL  62    79     ?Triglycerides <150 mg/dL  103    105     ?Creatinine 0.57 - 1.00 mg/dL 1.19   1.16   1.00   1.03     ? ? ?  12/31/2021  ?  8:41 AM 08/16/2021  ? 10:09 AM 08/06/2021  ?  9:08 AM 05/29/2021  ?  9:16 AM 05/29/2021  ?  8:49 AM 05/29/2021  ?  8:20 AM 05/20/2021  ?  5:40 PM  ?BP/Weight  ?Systolic BP 607 371 062 694 140 118 155  ?Diastolic BP 84 80 82 82 82 72 82  ?Wt. (Lbs) 186.08  189.12   192.12   ?BMI 31.94 kg/m2  32.46 kg/m2   32.98 kg/m2   ? ? ?  08/06/2021  ?  9:00 AM 06/20/2020  ?  9:00 AM  ?Foot/eye exam completion dates  ?Foot Form Completion Done Done  ? ? ? ? ?Updated lab needed  ? ?

## 2021-12-31 NOTE — Assessment & Plan Note (Signed)
Controlled, no change in medication ?DASH diet and commitment to daily physical activity for a minimum of 30 minutes discussed and encouraged, as a part of hypertension management. ?The importance of attaining a healthy weight is also discussed. ? ? ?  12/31/2021  ?  8:41 AM 08/16/2021  ? 10:09 AM 08/06/2021  ?  9:08 AM 05/29/2021  ?  9:16 AM 05/29/2021  ?  8:49 AM 05/29/2021  ?  8:20 AM 05/20/2021  ?  5:40 PM  ?BP/Weight  ?Systolic BP 257 493 552 174 140 118 155  ?Diastolic BP 84 80 82 82 82 72 82  ?Wt. (Lbs) 186.08  189.12   192.12   ?BMI 31.94 kg/m2  32.46 kg/m2   32.98 kg/m2   ? ? ? ? ?

## 2021-12-31 NOTE — Progress Notes (Signed)
? ?Jillian [name]     MRN: [id number]      DOB: 11/27/1940 ? ? ?HPI ?Jillian [name] is here for follow up and re-evaluation of chronic medical conditions, medication management and review of any available recent lab and radiology data.  ?Preventive health is updated, specifically  Cancer screening and Immunization.   ?Questions or concerns regarding consultations or procedures which the PT has had in the interim are  addressed. ?The PT denies any adverse reactions to current medications since the last visit.  ?There are no new concerns.  ?There are no specific complaints  ? ?ROS ?Denies recent fever or chills. ?Denies sinus pressure, nasal congestion, ear pain or sore throat. ?Denies chest congestion, productive cough or wheezing. ?Denies chest pains, palpitations and leg swelling ?Denies abdominal pain, nausea, vomiting,diarrhea or constipation.   ?Denies dysuria, frequency, hesitancy or incontinence. ?Denies joint pain, swelling and limitation in mobility. ?Denies headaches, seizures, numbness, or tingling. ?Denies depression, anxiety or insomnia. ?Denies skin break down or rash. ? ? ?PE ? ?BP 129/84   Pulse 70   Ht '5\' 4"'$  (1.626 m)   Wt 186 lb 1.3 oz (84.4 kg)   SpO2 95%   BMI 31.94 kg/m?  ? ?Patient alert and oriented and in no cardiopulmonary distress. ? ?HEENT: No facial asymmetry, EOMI,     Neck supple . ? ?Chest: Clear to auscultation bilaterally. ? ?CVS: S1, S2 no murmurs, no S3.Regular rate. ? ?ABD: Soft non tender.  ? ?Ext: No edema ? ?MS: Adequate ROM spine, shoulders, hips and knees. ? ?Skin: Intact, no ulcerations or rash noted. ? ?Psych: Good eye contact, normal affect. Memory intact not anxious or depressed appearing. ? ?CNS: CN 2-12 intact, power,  normal throughout.no focal deficits noted. ? ? ?Assessment & Plan ? ?Type 2 diabetes mellitus with vascular disease ([place]) ?Jillian [name] is reminded of the importance of commitment to daily physical activity for 30 minutes or more, as able and the need  to limit carbohydrate intake to 30 to 60 grams per meal to help with blood sugar control.  ? ?The need to take medication as prescribed, test blood sugar as directed, and to call between visits if there is a concern that blood sugar is uncontrolled is also discussed.  ? ?Jillian [name] is reminded of the importance of daily foot exam, annual eye examination, and good blood sugar, blood pressure and cholesterol control. ? ? ?  Latest Ref Rng & Units 08/06/2021  ?  9:46 AM 04/24/2021  ?  8:41 AM 02/06/2021  ?  9:05 AM 11/26/2020  ?  8:43 AM 07/09/2020  ? 11:53 AM  ?Diabetic Labs  ?HbA1c 4.8 - 5.6 % 6.8   6.9    6.7   6.3    ?Microalbumin Not Estab. ug/mL  10.1       ?Micro/Creat Ratio 0 - 29 mg/g creat  5       ?Chol 0 - 200 mg/dL  128    150     ?HDL >40 mg/dL  45    52     ?Calc LDL 0 - 99 mg/dL  62    79     ?Triglycerides <150 mg/dL  103    105     ?Creatinine 0.57 - 1.00 mg/dL 1.19   1.16   1.00   1.03     ? ? ?  12/31/2021  ?  8:41 AM 08/16/2021  ? 10:09 AM 08/06/2021  ?  9:08 AM 05/29/2021  ?  9:16 AM 05/29/2021  ?  8:49 AM 05/29/2021  ?  8:20 AM 05/20/2021  ?  5:40 PM  ?BP/Weight  ?Systolic BP 440 102 725 366 140 118 155  ?Diastolic BP 84 80 82 82 82 72 82  ?Wt. (Lbs) 186.08  189.12   192.12   ?BMI 31.94 kg/m2  32.46 kg/m2   32.98 kg/m2   ? ? ?  08/06/2021  ?  9:00 AM 06/20/2020  ?  9:00 AM  ?Foot/eye exam completion dates  ?Foot Form Completion Done Done  ? ? ? ? ?Updated lab needed  ? ? ?ESSENTIAL HYPERTENSION, BENIGN ?Controlled, no change in medication ?DASH diet and commitment to daily physical activity for a minimum of 30 minutes discussed and encouraged, as a part of hypertension management. ?The importance of attaining a healthy weight is also discussed. ? ? ?  12/31/2021  ?  8:41 AM 08/16/2021  ? 10:09 AM 08/06/2021  ?  9:08 AM 05/29/2021  ?  9:16 AM 05/29/2021  ?  8:49 AM 05/29/2021  ?  8:20 AM 05/20/2021  ?  5:40 PM  ?BP/Weight  ?Systolic BP 440 347 425 956 140 118 155  ?Diastolic BP 84 80 82 82 82 72 82  ?Wt. (Lbs) 186.08   189.12   192.12   ?BMI 31.94 kg/m2  32.46 kg/m2   32.98 kg/m2   ? ? ? ? ? ?Hyperlipidemia with target LDL less than 100 ?Hyperlipidemia:Low fat diet discussed and encouraged. ? ? ?Lipid Panel  ?Lab Results  ?Component Value Date  ? CHOL 128 04/24/2021  ? HDL 45 04/24/2021  ? [place] 62 04/24/2021  ? TRIG 103 04/24/2021  ? CHOLHDL 2.8 04/24/2021  ? ? ? ?Updated lab needed. ? ? ?Obese ? ?Patient re-educated about  the importance of commitment to a  minimum of 150 minutes of exercise per week as able. ? ?The importance of healthy food choices with portion control discussed, as well as eating regularly and within a 12 hour window most days. ?The need to choose "clean , green" food 50 to 75% of the time is discussed, as well as to make water the primary drink and set a goal of 64 ounces water daily. ? ?  ? ?  12/31/2021  ?  8:41 AM 08/06/2021  ?  9:08 AM 05/29/2021  ?  8:20 AM  ?Weight /BMI  ?Weight 186 lb 1.3 oz 189 lb 1.9 oz 192 lb 1.9 oz  ?Height '5\' 4"'$  (1.626 m) '5\' 4"'$  (1.626 m) '5\' 4"'$  (1.626 m)  ?BMI 31.94 kg/m2 32.46 kg/m2 32.98 kg/m2  ? ? ? ? ?Papilloma of right breast ?Oncology f/u in 01/2022 ? ?

## 2021-12-31 NOTE — Patient Instructions (Addendum)
F/U end August , call if you need me before, flu vaccine at visit ? ?Annual exam in November 16 or after, ? ?Pls add cBC and B12 level to labs ordered in November which she will get today ? ?Please get shingrix  vaccine and covid booster ? ?Nurse plss get eye exam , [facility] ? ?It is important that you exercise regularly at least 30 minutes 5 times a week. If you develop chest pain, have severe difficulty breathing, or feel very tired, stop exercising immediately and seek medical attention  ? ?Think about what you will eat, plan ahead. ?Choose " clean, green, fresh or frozen" over canned, processed or packaged foods which are more sugary, salty and fatty. ?70 to 75% of food eaten should be vegetables and fruit. ?Three meals at set times with snacks allowed between meals, but they must be fruit or vegetables. ?Aim to eat over a 12 hour period , example 7 am to 7 pm, and STOP after  your last meal of the day. ?Drink water,generally about 64 ounces per day, no other drink is as healthy. Fruit juice is best enjoyed in a healthy way, by EATING the fruit. ?Thanks for choosing [facility], we consider it a privelige to serve you. ? ?

## 2021-12-31 NOTE — Assessment & Plan Note (Signed)
?  Patient re-educated about  the importance of commitment to a  minimum of 150 minutes of exercise per week as able. ? ?The importance of healthy food choices with portion control discussed, as well as eating regularly and within a 12 hour window most days. ?The need to choose "clean , green" food 50 to 75% of the time is discussed, as well as to make water the primary drink and set a goal of 64 ounces water daily. ? ?  ? ?  12/31/2021  ?  8:41 AM 08/06/2021  ?  9:08 AM 05/29/2021  ?  8:20 AM  ?Weight /BMI  ?Weight 186 lb 1.3 oz 189 lb 1.9 oz 192 lb 1.9 oz  ?Height '5\' 4"'$  (1.626 m) '5\' 4"'$  (1.626 m) '5\' 4"'$  (1.626 m)  ?BMI 31.94 kg/m2 32.46 kg/m2 32.98 kg/m2  ? ? ? ?

## 2022-01-29 ENCOUNTER — Other Ambulatory Visit: Payer: Self-pay | Admitting: Family Medicine

## 2022-01-29 DIAGNOSIS — D472 Monoclonal gammopathy: Secondary | ICD-10-CM | POA: Diagnosis not present

## 2022-01-29 DIAGNOSIS — I10 Essential (primary) hypertension: Secondary | ICD-10-CM | POA: Diagnosis not present

## 2022-01-29 DIAGNOSIS — E1122 Type 2 diabetes mellitus with diabetic chronic kidney disease: Secondary | ICD-10-CM | POA: Diagnosis not present

## 2022-01-29 DIAGNOSIS — N281 Cyst of kidney, acquired: Secondary | ICD-10-CM | POA: Diagnosis not present

## 2022-01-29 DIAGNOSIS — I129 Hypertensive chronic kidney disease with stage 1 through stage 4 chronic kidney disease, or unspecified chronic kidney disease: Secondary | ICD-10-CM | POA: Diagnosis not present

## 2022-01-29 DIAGNOSIS — E538 Deficiency of other specified B group vitamins: Secondary | ICD-10-CM | POA: Diagnosis not present

## 2022-01-29 DIAGNOSIS — N189 Chronic kidney disease, unspecified: Secondary | ICD-10-CM | POA: Diagnosis not present

## 2022-01-30 LAB — CBC WITH DIFFERENTIAL/PLATELET
Absolute Monocytes: 484 cells/uL (ref 200–950)
Basophils Absolute: 21 cells/uL (ref 0–200)
Basophils Relative: 0.4 %
Eosinophils Absolute: 10 cells/uL — ABNORMAL LOW (ref 15–500)
Eosinophils Relative: 0.2 %
HCT: 38.2 % (ref 35.0–45.0)
Hemoglobin: 12.2 g/dL (ref 11.7–15.5)
Lymphs Abs: 2283 cells/uL (ref 850–3900)
MCH: 29.6 pg (ref 27.0–33.0)
MCHC: 31.9 g/dL — ABNORMAL LOW (ref 32.0–36.0)
MCV: 92.7 fL (ref 80.0–100.0)
MPV: 12.2 fL (ref 7.5–12.5)
Monocytes Relative: 9.3 %
Neutro Abs: 2402 cells/uL (ref 1500–7800)
Neutrophils Relative %: 46.2 %
Platelets: 175 10*3/uL (ref 140–400)
RBC: 4.12 10*6/uL (ref 3.80–5.10)
RDW: 14.9 % (ref 11.0–15.0)
Total Lymphocyte: 43.9 %
WBC: 5.2 10*3/uL (ref 3.8–10.8)

## 2022-01-30 LAB — VITAMIN B12: Vitamin B-12: 1192 pg/mL — ABNORMAL HIGH (ref 200–1100)

## 2022-02-05 DIAGNOSIS — I129 Hypertensive chronic kidney disease with stage 1 through stage 4 chronic kidney disease, or unspecified chronic kidney disease: Secondary | ICD-10-CM | POA: Diagnosis not present

## 2022-02-05 DIAGNOSIS — E1122 Type 2 diabetes mellitus with diabetic chronic kidney disease: Secondary | ICD-10-CM | POA: Diagnosis not present

## 2022-02-05 DIAGNOSIS — N189 Chronic kidney disease, unspecified: Secondary | ICD-10-CM | POA: Diagnosis not present

## 2022-02-05 DIAGNOSIS — D472 Monoclonal gammopathy: Secondary | ICD-10-CM | POA: Diagnosis not present

## 2022-02-10 ENCOUNTER — Ambulatory Visit (HOSPITAL_COMMUNITY)
Admission: RE | Admit: 2022-02-10 | Discharge: 2022-02-10 | Disposition: A | Discharge status: Home / Self Care | Payer: Medicare Other | Source: Ambulatory Visit | Attending: Hematology | Admitting: Hematology

## 2022-02-10 ENCOUNTER — Inpatient Hospital Stay (HOSPITAL_COMMUNITY): Payer: Medicare Other | Attending: Hematology

## 2022-02-10 DIAGNOSIS — E559 Vitamin D deficiency, unspecified: Secondary | ICD-10-CM | POA: Diagnosis not present

## 2022-02-10 DIAGNOSIS — D241 Benign neoplasm of right breast: Secondary | ICD-10-CM | POA: Diagnosis not present

## 2022-02-10 DIAGNOSIS — Z853 Personal history of malignant neoplasm of breast: Secondary | ICD-10-CM | POA: Diagnosis not present

## 2022-02-10 DIAGNOSIS — R1310 Dysphagia, unspecified: Secondary | ICD-10-CM | POA: Insufficient documentation

## 2022-02-10 DIAGNOSIS — Z79899 Other long term (current) drug therapy: Secondary | ICD-10-CM | POA: Insufficient documentation

## 2022-02-10 DIAGNOSIS — E538 Deficiency of other specified B group vitamins: Secondary | ICD-10-CM | POA: Insufficient documentation

## 2022-02-10 DIAGNOSIS — R61 Generalized hyperhidrosis: Secondary | ICD-10-CM | POA: Diagnosis not present

## 2022-02-10 DIAGNOSIS — Z1231 Encounter for screening mammogram for malignant neoplasm of breast: Secondary | ICD-10-CM | POA: Diagnosis not present

## 2022-02-10 LAB — IRON AND TIBC
Iron: 172 ug/dL — ABNORMAL HIGH (ref 28–170)
Saturation Ratios: 61 % — ABNORMAL HIGH (ref 10.4–31.8)
TIBC: 284 ug/dL (ref 250–450)
UIBC: 112 ug/dL

## 2022-02-10 LAB — CBC WITH DIFFERENTIAL/PLATELET
Abs Immature Granulocytes: 0.03 10*3/uL (ref 0.00–0.07)
Basophils Absolute: 0 10*3/uL (ref 0.0–0.1)
Basophils Relative: 0 %
Eosinophils Absolute: 0 10*3/uL (ref 0.0–0.5)
Eosinophils Relative: 0 %
HCT: 39.8 % (ref 36.0–46.0)
Hemoglobin: 12.1 g/dL (ref 12.0–15.0)
Immature Granulocytes: 1 %
Lymphocytes Relative: 39 %
Lymphs Abs: 1.9 10*3/uL (ref 0.7–4.0)
MCH: 29.3 pg (ref 26.0–34.0)
MCHC: 30.4 g/dL (ref 30.0–36.0)
MCV: 96.4 fL (ref 80.0–100.0)
Monocytes Absolute: 0.5 10*3/uL (ref 0.1–1.0)
Monocytes Relative: 10 %
Neutro Abs: 2.4 10*3/uL (ref 1.7–7.7)
Neutrophils Relative %: 50 %
Platelets: 155 10*3/uL (ref 150–400)
RBC: 4.13 MIL/uL (ref 3.87–5.11)
RDW: 14.9 % (ref 11.5–15.5)
WBC: 4.8 10*3/uL (ref 4.0–10.5)
nRBC: 0 % (ref 0.0–0.2)

## 2022-02-10 LAB — VITAMIN B12: Vitamin B-12: 1528 pg/mL — ABNORMAL HIGH (ref 180–914)

## 2022-02-10 LAB — VITAMIN D 25 HYDROXY (VIT D DEFICIENCY, FRACTURES): Vit D, 25-Hydroxy: 48.8 ng/mL (ref 30–100)

## 2022-02-10 LAB — FERRITIN: Ferritin: 91 ng/mL (ref 11–307)

## 2022-02-13 ENCOUNTER — Other Ambulatory Visit: Payer: Self-pay

## 2022-02-13 ENCOUNTER — Ambulatory Visit (HOSPITAL_COMMUNITY): Payer: Medicare HMO | Admitting: Hematology

## 2022-02-13 ENCOUNTER — Telehealth: Payer: Self-pay | Admitting: Family Medicine

## 2022-02-13 MED ORDER — BENAZEPRIL HCL 5 MG PO TABS: 5.0000 mg | ORAL_TABLET | Freq: Every day | ORAL | 1 refills | Status: DC

## 2022-02-13 NOTE — Telephone Encounter (Signed)
Refills sent

## 2022-02-13 NOTE — Telephone Encounter (Signed)
Pt called stating the she is need a refill of benazperil '5mg'$ ?  [facility] [address]

## 2022-02-19 ENCOUNTER — Inpatient Hospital Stay (HOSPITAL_BASED_OUTPATIENT_CLINIC_OR_DEPARTMENT_OTHER): Payer: Medicare Other | Admitting: Hematology

## 2022-02-19 VITALS — BP 152/93 | HR 63 | Temp 96.7°F | Resp 18 | Ht 64.76 in | Wt 184.7 lb

## 2022-02-19 DIAGNOSIS — Z79899 Other long term (current) drug therapy: Secondary | ICD-10-CM | POA: Diagnosis not present

## 2022-02-19 DIAGNOSIS — D241 Benign neoplasm of right breast: Secondary | ICD-10-CM | POA: Diagnosis not present

## 2022-02-19 DIAGNOSIS — Z853 Personal history of malignant neoplasm of breast: Secondary | ICD-10-CM | POA: Diagnosis not present

## 2022-02-19 DIAGNOSIS — R1310 Dysphagia, unspecified: Secondary | ICD-10-CM | POA: Diagnosis not present

## 2022-02-19 DIAGNOSIS — E559 Vitamin D deficiency, unspecified: Secondary | ICD-10-CM

## 2022-02-19 DIAGNOSIS — E538 Deficiency of other specified B group vitamins: Secondary | ICD-10-CM

## 2022-02-19 DIAGNOSIS — R61 Generalized hyperhidrosis: Secondary | ICD-10-CM | POA: Diagnosis not present

## 2022-02-19 NOTE — Patient Instructions (Addendum)
[place] at [facility] Discharge Instructions  You were seen today by Dr. Delton Coombes. He went over your recent results. You will be scheduled for prior to your next visit. Dr. Delton Coombes will see you back in 1 year for labs and follow up.   Thank you for choosing [place] at [facility] to provide your oncology and hematology care.  To afford each patient quality time with our provider, please arrive at least 15 minutes before your scheduled appointment time.   If you have a lab appointment with the [facility] please come in thru the Main Entrance and check in at the main information desk  You need to re-schedule your appointment should you arrive 10 or more minutes late.  We strive to give you quality time with our providers, and arriving late affects you and other patients whose appointments are after yours.  Also, if you no show three or more times for appointments you may be dismissed from the clinic at the providers discretion.     Again, thank you for choosing [facility].  Our hope is that these requests will decrease the amount of time that you wait before being seen by our physicians.       _____________________________________________________________  Should you have questions after your visit to [facility], please contact our office at [phone] between the hours of 8:00 a.m. and 4:30 p.m.  Voicemails left after 4:00 p.m. will not be returned until the following business day.  For prescription refill requests, have your pharmacy contact our office and allow 72 hours.    Cancer Center Support Programs:   > Cancer Support Group  2nd Tuesday of the month 1pm-2pm, Journey Room

## 2022-02-19 NOTE — Progress Notes (Signed)
[facility] [address] [place] [address]   Patient Care Team: Fayrene Helper, MD as PCP - General Fields, Marga Melnick, MD (Inactive) (Gastroenterology) Hurley Cisco, MD as Consulting Physician (Rheumatology) Whitney Muse, Kelby Fam, MD (Inactive) as Consulting Physician (Hematology and Oncology)  SUMMARY OF ONCOLOGIC HISTORY: Oncology History   No history exists.    CHIEF COMPLIANT: Follow-up breast cancer   INTERVAL HISTORY: Jillian [name] is a 81 y.o. female here today for follow up of her breast cancer. Her last visit was on 02/13/2021.   Today she reports feeling good. She continues to take 2000 units of vitamin D daily.   REVIEW OF SYSTEMS:   Review of Systems  Constitutional:  Negative for appetite change and fatigue.  HENT:   Positive for trouble swallowing.   All other systems reviewed and are negative.  I have reviewed the past medical history, past surgical history, social history and family history with the patient and they are unchanged from previous note.   ALLERGIES:   is allergic to ciprofloxacin, metronidazole, codeine, propoxyphene, and propoxyphene n-acetaminophen.   MEDICATIONS:  Current Outpatient Medications  Medication Sig Dispense Refill   acetaminophen (TYLENOL) 500 MG tablet Take 1,000 mg by mouth every 6 (six) hours as needed for mild pain.     aspirin EC 81 MG tablet Take 1 tablet (81 mg total) by mouth every evening. (Patient taking differently: Take 81 mg by mouth at bedtime.) 90 tablet 1   benazepril (LOTENSIN) 5 MG tablet Take 1 tablet (5 mg total) by mouth daily. 90 tablet 1   Blood Glucose Monitoring Suppl (TRUE METRIX METER) w/Device KIT USE AS DIRECTED 1 kit 0   cholecalciferol (VITAMIN D3) 25 MCG (1000 UNIT) tablet Take 2 tablets (2,000 Units total) by mouth daily. 180 tablet 3   cloNIDine (CATAPRES) 0.3 MG tablet TAKE 1 TABLET EVERY EVENING AT 8 PM (STOP 0.$RemoveBe'2MG'vgSZIKVrG$  TABLET) 90 tablet 1   linagliptin (TRADJENTA)  5 MG TABS tablet Take 1 tablet (5 mg total) by mouth daily. 90 tablet 3   pravastatin (PRAVACHOL) 10 MG tablet Take 1 tablet (10 mg total) by mouth daily. 90 tablet 1   TRUE METRIX BLOOD GLUCOSE TEST test strip TEST ONE TIME DAILY 100 strip 2   vitamin B-12 (CYANOCOBALAMIN) 1000 MCG tablet TAKE 1 TABLET (1,000 MCG TOTAL) BY MOUTH EVERY MONDAY, WEDNESDAY, AND FRIDAY. 39 tablet 3   No current facility-administered medications for this visit.     PHYSICAL EXAMINATION: Performance status (ECOG): 1 - Symptomatic but completely ambulatory  There were no vitals filed for this visit. Wt Readings from Last 3 Encounters:  12/31/21 186 lb 1.3 oz (84.4 kg)  08/06/21 189 lb 1.9 oz (85.8 kg)  05/29/21 192 lb 1.9 oz (87.1 kg)   Physical Exam Vitals reviewed.  Constitutional:      Appearance: Normal appearance.  Cardiovascular:     Rate and Rhythm: Normal rate and regular rhythm.     Pulses: Normal pulses.     Heart sounds: Normal heart sounds.  Pulmonary:     Effort: Pulmonary effort is normal.     Breath sounds: Normal breath sounds.  Chest:  Breasts:    Right: No swelling, bleeding, inverted nipple, mass, nipple discharge, skin change (UIQ lumpectomy scar WNL) or tenderness.     Left: No swelling, bleeding, inverted nipple, mass, nipple discharge, skin change or tenderness.  Abdominal:     Palpations: Abdomen is soft. There is no hepatomegaly, splenomegaly  or mass.     Tenderness: There is no abdominal tenderness.  Lymphadenopathy:     Upper Body:     Right upper body: No supraclavicular or axillary adenopathy.     Left upper body: No supraclavicular or axillary adenopathy.  Neurological:     General: No focal deficit present.     Mental Status: She is alert and oriented to person, place, and time.  Psychiatric:        Mood and Affect: Mood normal.        Behavior: Behavior normal.    Breast Exam Chaperone: Thana Ates     LABORATORY DATA:  I have reviewed the data as  listed    Latest Ref Rng & Units 08/06/2021    9:46 AM 04/24/2021    8:41 AM 02/06/2021    9:05 AM  CMP  Glucose 70 - 99 mg/dL 112   159   102    BUN 8 - 27 mg/dL $Remove'9   12   20    'SsCHaUQ$ Creatinine 0.57 - 1.00 mg/dL 1.19   1.16   1.00    Sodium 134 - 144 mmol/L 144   139   139    Potassium 3.5 - 5.2 mmol/L 4.6   3.8   3.8    Chloride 96 - 106 mmol/L 107   106   103    CO2 20 - 29 mmol/L $RemoveB'23   25   27    'ifyMOhur$ Calcium 8.7 - 10.3 mg/dL 9.6   8.8   8.7    Total Protein 6.5 - 8.1 g/dL  7.4   7.1    Total Bilirubin 0.3 - 1.2 mg/dL  0.4   0.7    Alkaline Phos 38 - 126 U/L  70   69    AST 15 - 41 U/L  20   16    ALT 0 - 44 U/L  14   19     No results found for: XVQ008 Lab Results  Component Value Date   WBC 4.8 02/10/2022   HGB 12.1 02/10/2022   HCT 39.8 02/10/2022   MCV 96.4 02/10/2022   PLT 155 02/10/2022   NEUTROABS 2.4 02/10/2022    ASSESSMENT:  1.  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 - She took tamoxifen from 2016 through 2021.   2.  Vitamin B-12 deficiency: -She is on vitamin B12 tablet every other day.   3.  Night sweats: -She also had elevated white count, predominantly neutrophils. - Flow cytometry was normal.  BCR/ABL was negative.  Night sweats improved after she stopped tamoxifen.   PLAN:  1.  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  Lumpectomy site is within normal limits.  No palpable adenopathy. - Reviewed mammogram from 02/10/2022, BI-RADS Category 1. - Reviewed labs which showed normal CBC and iron panel. - Recommend follow-up in 1 year with mammogram.   2.  Vitamin D deficiency: - Continue vitamin D 2000 units daily.  Vitamin D level is 48.  Breast Cancer therapy associated bone loss: I have recommended calcium, Vitamin D and weight bearing exercises.  Orders placed this encounter:  No  orders of the defined types were placed in this encounter.   The patient has a good understanding of the overall plan. She agrees with it. She will call with any problems that may develop  before the next visit here.  Derek Jack, MD [place] [phone]   I, Thana Ates, am acting as a scribe for Dr. Derek Jack.  I, Derek Jack MD, have reviewed the above documentation for accuracy and completeness, and I agree with the above.

## 2022-02-24 ENCOUNTER — Ambulatory Visit (INDEPENDENT_AMBULATORY_CARE_PROVIDER_SITE_OTHER): Payer: Medicare Other

## 2022-02-24 DIAGNOSIS — Z780 Asymptomatic menopausal state: Secondary | ICD-10-CM

## 2022-02-24 DIAGNOSIS — Z0000 Encounter for general adult medical examination without abnormal findings: Secondary | ICD-10-CM

## 2022-02-24 NOTE — Patient Instructions (Addendum)
   Jillian [name] , Thank you for taking time to come for your Medicare Wellness Visit. I appreciate your ongoing commitment to your health goals. Please review the following plan we discussed and let me know if I can assist you in the future.   Your bone density has been ordered. Please call [phone] to schedule    These are the goals we discussed:  Goals      DIET - INCREASE WATER INTAKE     Exercise 3x per week (30 min per time)     Recommend starting a routine exercise program at least 3 days a week for 30-45 minutes at a time as tolerated.        LIFESTYLE - DECREASE FALLS RISK        This is a list of the screening recommended for you and due dates:  Health Maintenance  Topic Date Due   COVID-19 Vaccine (3 - Moderna risk series) 04/02/2021   Hemoglobin A1C  02/03/2022   Tetanus Vaccine  08/14/2022*   Flu Shot  04/22/2022   Complete foot exam   08/06/2022   Eye exam for diabetics  12/04/2022   Pneumonia Vaccine  Completed   DEXA scan (bone density measurement)  Completed   Hepatitis C Screening: USPSTF Recommendation to screen - Ages 68-79 yo.  Completed   Zoster (Shingles) Vaccine  Completed   HPV Vaccine  Aged Out  *Topic was postponed. The date shown is not the original due date.

## 2022-02-24 NOTE — Progress Notes (Signed)
I connected with  Jillian [name] on 02/24/22 by a audio enabled telemedicine application and verified that I am speaking with the correct person using two identifiers.  Patient Location: Home  Provider Location: Office/Clinic  I discussed the limitations of evaluation and management by telemedicine. The patient expressed understanding and agreed to proceed.  Subjective:   Jillian [name] is a 81 y.o. female who presents for Medicare Annual (Subsequent) preventive examination.  Review of Systems     Cardiac Risk Factors include: advanced age (>45mn, >>49women);dyslipidemia;hypertension;diabetes mellitus     Objective:    Today's Vitals   02/24/22 1046  PainSc: 0-No pain   There is no height or weight on file to calculate BMI.     02/24/2022   10:12 AM 02/19/2022   11:48 AM 06/12/2021   10:42 AM 02/13/2021   10:47 AM 02/12/2021    9:19 AM 05/08/2020    9:19 AM 04/01/2020    4:59 PM  Advanced Directives  Does Patient Have a Medical Advance Directive? _0  Yes Yes  Type of ACorporate treasurerof ANorth Crows NestLiving will  HHarrellsLiving will [place]Living will [place]Living will HEthan Does patient want to make changes to medical advance directive? No - Patient declined  No - Patient declined No - Patient declined  No - Patient declined   Copy of [place]wn[place]in Chart? No - copy requested No - copy requested  No - copy requested No - copy requested No - copy requested   Would patient like information on creating a medical advance directive?    No - Patient declined  No - Patient declined     Current Medications (verified) Outpatient Encounter Medications as of 02/24/2022  Medication Sig   acetaminophen (TYLENOL) 500 MG tablet Take 1,000 mg by mouth every 6 (six) hours as needed for mild pain.   aspirin EC 81 MG tablet Take 1 tablet (81 mg total) by mouth  every evening. (Patient taking differently: Take 81 mg by mouth at bedtime.)   benazepril (LOTENSIN) 5 MG tablet Take 1 tablet (5 mg total) by mouth daily.   Blood Glucose Monitoring Suppl (TRUE METRIX METER) w/Device KIT USE AS DIRECTED   cholecalciferol (VITAMIN D3) 25 MCG (1000 UNIT) tablet Take 2 tablets (2,000 Units total) by mouth daily.   cloNIDine (CATAPRES) 0.3 MG tablet TAKE 1 TABLET EVERY EVENING AT 8 PM (STOP 0.2MG TABLET)   linagliptin (TRADJENTA) 5 MG TABS tablet Take 1 tablet (5 mg total) by mouth daily.   pravastatin (PRAVACHOL) 10 MG tablet Take 1 tablet (10 mg total) by mouth daily.   TRUE METRIX BLOOD GLUCOSE TEST test strip TEST ONE TIME DAILY   No facility-administered encounter medications on file as of 02/24/2022.    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 Oneida Alar :  internal hemorrhoids, mild left-sided diverticulosis   COLONOSCOPY N/A 09/21/2019   Procedure: COLONOSCOPY;  Surgeon: Danie Binder, MD;  [facility];  Service: Endoscopy;   Laterality: N/A;  12:00pm   CYST EXCISION Right    arm   ESOPHAGOGASTRODUODENOSCOPY  2013   Dr. Oneida Alar: gastritis likely NSAID-induced. Duodenal polyp. Duodenitis and gastritis in setting of aspirin and Ibuprofen.    EYE SURGERY Right 06/07/2013   cataract   EYE SURGERY Left 09/14/2013   cataract   KNEE ARTHROSCOPY Right 02/26/2016   Procedure: ARTHROSCOPY RIGHT KNEE WITH MENSICAL DEBRIDEMENT;  Surgeon: Gaynelle Arabian, MD;  Location: [facility];  Service: Orthopedics;  Laterality: Right;   left breast biopsy     LESION REMOVAL Right 03/30/2013   Procedure: EXCISION OF NEOPLASM ARM;  Surgeon: Jamesetta So, MD;  Location: [facility];  Service: General;  Laterality: Right;   TUBAL LIGATION     Family History  Problem Relation Age of Onset   Hypertension Father    Congestive Heart Failure Father    Liver cancer Mother    Hypertension Sister    Hypertension Brother    Diabetes Brother    Colon cancer Neg Hx    Colon polyps Neg Hx    Social History   Socioeconomic History   Marital status: Widowed    Spouse name: Not on file   Number of children: Not on file   Years of education: Not on file   Highest education level: Not on file  Occupational History   Not on file  Tobacco Use   Smoking status: Never   Smokeless tobacco: Never  Vaping Use   Vaping Use: Never used  Substance and Sexual Activity   Alcohol use: No   Drug use: No   Sexual activity: Not Currently    Birth control/protection: Surgical  Other Topics Concern   Not on file  Social History Narrative   Not on file   Social Determinants of Health   Financial Resource Strain: Low Risk    Difficulty of Paying Living Expenses: Not hard at all  Food Insecurity: Not on file  Transportation Needs: No Transportation Needs   Lack of Transportation (Medical): No   Lack of Transportation (Non-Medical): No  Physical Activity: Insufficiently Active   Days of Exercise per Week: 5 days   Minutes of Exercise per Session: 20 min   Stress: Not on file  Social Connections: Moderately Isolated   Frequency of Communication with Friends and Family: More than three times a week   Frequency of Social Gatherings with Friends and Family: More than three times a week   Attends Religious Services: More than 4 times per year   Active Member of [employer] or Organizations: No   Attends Archivist Meetings: Never   Marital Status: Widowed    Tobacco Counseling Counseling given: Not Answered   Clinical Intake:  Pre-visit preparation completed: No  Pain : No/denies pain Pain Score: 0-No pain     Diabetes: No  How often do you need to have someone help  you when you read instructions, pamphlets, or other written materials from your doctor or pharmacy?: 1 - Never  Diabetic?Nutrition Risk Assessment:  Has the patient had any N/V/D within the last 2 months?  No  Does the patient have any non-healing wounds?  No  Has the patient had any unintentional weight loss or weight gain?  No   Diabetes:  Is the patient diabetic?  Yes  If diabetic, was a CBG obtained today?  No  Did the patient bring in their glucometer from home?  No  How often do you monitor your CBG's? Every other day .   Financial Strains and Diabetes Management:  Are you having any financial strains with the device, your supplies or your medication? No .  Does the patient want to be seen by Chronic Care Management for management of their diabetes?  No  Would the patient like to be referred to a Nutritionist or for Diabetic Management?  No   Diabetic Exams:  Diabetic Eye Exam: Completed [facility] [place]  Diabetic Foot Exam: Completed 07/2021    Interpreter Needed?: No      Activities of Daily Living    02/24/2022   10:49 AM  In your present state of health, do you have any difficulty performing the following activities:  Hearing? 0  Vision? 0  Difficulty concentrating or making decisions? 0  Walking or climbing stairs? 1   Dressing or bathing? 0  Doing errands, shopping? 0  Preparing Food and eating ? N  Using the Toilet? N  In the past six months, have you accidently leaked urine? N  Do you have problems with loss of bowel control? N  Managing your Medications? N  Managing your Finances? N  Housekeeping or managing your Housekeeping? N    Patient Care Team: Fayrene Helper, MD as PCP - General Fields, Marga Melnick, MD (Inactive) (Gastroenterology) Hurley Cisco, MD as Consulting Physician (Rheumatology) Whitney Muse, Kelby Fam, MD (Inactive) as Consulting Physician (Hematology and Oncology)  Indicate any recent Medical Services you may have received from other than [facility] providers in the past year (date may be approximate).     Assessment:   This is a routine wellness examination for [place].  Hearing/Vision screen No results found.  Dietary issues and exercise activities discussed: Current Exercise Habits: Home exercise routine, Type of exercise: walking, Time (Minutes): 15, Frequency (Times/Week): 5, Weekly Exercise (Minutes/Week): 75, Intensity: Mild   Goals Addressed             This Visit's Progress    DIET - INCREASE WATER INTAKE   On track    [place]   On track      Depression Screen    02/24/2022   10:49 AM 12/31/2021    8:41 AM 05/29/2021    8:22 AM 04/02/2021    8:26 AM 02/12/2021    9:17 AM 01/29/2021    1:04 PM 11/26/2020    8:26 AM  PHQ 2/9 Scores  PHQ - 2 Score 0 0 0 0 0 0 0    Fall Risk    02/24/2022   10:49 AM 12/31/2021    8:41 AM 08/06/2021    9:08 AM 05/29/2021    8:21 AM 04/02/2021    8:26 AM  Fall Risk   Falls in the past year? 0 0 0 0 0  Number falls in past yr: 0 0 0  0  Injury with Fall? 0 0 0 0 0  Risk for  fall due to :  No Fall Risks   No Fall Risks  Follow up  Falls evaluation completed   Falls evaluation completed    [place]:  Any stairs in or around the home? No  If so, are there any without  handrails? No  Home free of loose throw rugs in walkways, pet beds, electrical cords, etc? Yes  Adequate lighting in your home to reduce risk of falls? Yes   ASSISTIVE DEVICES UTILIZED TO PREVENT FALLS:  Life alert? No  Use of a cane, walker or w/c? No  Grab bars in the bathroom? Yes  Shower chair or bench in shower? Yes  Elevated toilet seat or a handicapped toilet? No   Cognitive Function:        02/24/2022   10:51 AM 02/12/2021    9:20 AM 02/06/2020    9:23 AM 09/01/2019    9:39 AM 02/03/2019    9:31 AM  6CIT Screen  What Year? 0 points 0 points 0 points 0 points 0 points  What month? 0 points 0 points 0 points 0 points 0 points  What time? 0 points 0 points 0 points 0 points 0 points  Count back from 20 0 points 0 points 0 points 0 points 4 points  Months in reverse 0 points 0 points 0 points 0 points 0 points  Repeat phrase 0 points 0 points 4 points 0 points 0 points  Total Score 0 points 0 points 4 points 0 points 4 points    Immunizations Immunization History  Administered Date(s) Administered   Fluad Quad(high Dose 65+) 05/18/2019, 06/20/2020, 05/29/2021   H1N1 10/16/2008   Influenza Whole 07/02/2007, 06/14/2009, 06/04/2010   Influenza,inj,Quad PF,6+ Mos 07/20/2013, 06/01/2014, 06/26/2015, 07/28/2016, 05/19/2017, 06/03/2018   Moderna SARS-COV2 Booster Vaccination 08/02/2020, 03/05/2021   Moderna Sars-Covid-2 Vaccination 10/01/2019, 10/31/2019   Pneumococcal Conjugate-13 09/28/2014   Pneumococcal Polysaccharide-23 04/20/2009   Td 05/01/2004   Zoster Recombinat (Shingrix) 04/22/2021, 12/31/2021   Zoster, Live 10/23/2006    TDAP status: Up to date  Flu Vaccine status: Up to date  Pneumococcal vaccine status: Up to date  Covid-19 vaccine status: Completed vaccines  Qualifies for Shingles Vaccine? Yes   Zostavax completed yes 2008 Shingrix Completed?: Yes  Screening Tests Health Maintenance  Topic Date Due   COVID-19 Vaccine (3 - Moderna risk series)  04/02/2021   HEMOGLOBIN A1C  02/03/2022   TETANUS/TDAP  08/14/2022 (Originally 05/01/2014)   INFLUENZA VACCINE  04/22/2022   FOOT EXAM  08/06/2022   OPHTHALMOLOGY EXAM  12/04/2022   Pneumonia Vaccine 58+ Years old  Completed   DEXA SCAN  Completed   Hepatitis C Screening  Completed   Zoster Vaccines- Shingrix  Completed   HPV VACCINES  Aged Out    Health Maintenance  Health Maintenance Due  Topic Date Due   COVID-19 Vaccine (3 - Moderna risk series) 04/02/2021   HEMOGLOBIN A1C  02/03/2022    Colorectal cancer screening: No longer required.   Mammogram status: Completed yes. Repeat every year  Bone Density status: Ordered today. Pt provided with contact info and advised to call to schedule appt.  Lung Cancer Screening: (Low Dose CT Chest recommended if Age 67-80 years, 30 pack-year currently smoking OR have quit w/in 15years.) does not qualify.   Lung Cancer Screening Referral: na  Additional Screening:  Hepatitis C Screening: does not qualify; Completed yes  Vision Screening: Recommended annual ophthalmology exams for early detection of glaucoma and other disorders of  the eye. Is the patient up to date with their annual eye exam?  Yes  Who is the provider or what is the name of the office in which the patient attends annual eye exams? [facility]  If pt is not established with a provider, would they like to be referred to a provider to establish care? No .   Dental Screening: Recommended annual dental exams for proper oral hygiene  Community Resource Referral / Chronic Care Management: CRR required this visit?  No   CCM required this visit?  No      Plan:     I have personally reviewed and noted the following in the patient's chart:   Medical and social history Use of alcohol, tobacco or illicit drugs  Current medications and supplements including opioid prescriptions.  Functional ability and status Nutritional status Physical activity Advanced  directives List of other physicians Hospitalizations, surgeries, and ER visits in previous 12 months Vitals Screenings to include cognitive, depression, and falls Referrals and appointments  In addition, I have reviewed and discussed with patient certain preventive protocols, quality metrics, and best practice recommendations. A written personalized care plan for preventive services as well as general preventive health recommendations were provided to patient.     Eual Fines, LPN   09/27/1094   Nurse Notes:  Ms. Viviano , Thank you for taking time to come for your Medicare Wellness Visit. I appreciate your ongoing commitment to your health goals. Please review the following plan we discussed and let me know if I can assist you in the future.   These are the goals we discussed:  Goals      DIET - INCREASE WATER INTAKE     Exercise 3x per week (30 min per time)     Recommend starting a routine exercise program at least 3 days a week for 30-45 minutes at a time as tolerated.        LIFESTYLE - DECREASE FALLS RISK        This is a list of the screening recommended for you and due dates:  Health Maintenance  Topic Date Due   COVID-19 Vaccine (3 - Moderna risk series) 04/02/2021   Hemoglobin A1C  02/03/2022   Tetanus Vaccine  08/14/2022*   Flu Shot  04/22/2022   Complete foot exam   08/06/2022   Eye exam for diabetics  12/04/2022   Pneumonia Vaccine  Completed   DEXA scan (bone density measurement)  Completed   Hepatitis C Screening: USPSTF Recommendation to screen - Ages 32-79 yo.  Completed   Zoster (Shingles) Vaccine  Completed   HPV Vaccine  Aged Out  *Topic was postponed. The date shown is not the original due date.

## 2022-03-03 ENCOUNTER — Other Ambulatory Visit: Payer: Self-pay | Admitting: Family Medicine

## 2022-03-06 ENCOUNTER — Other Ambulatory Visit: Payer: Self-pay | Admitting: Family Medicine

## 2022-03-25 ENCOUNTER — Other Ambulatory Visit: Payer: Self-pay | Admitting: Family Medicine

## 2022-04-24 ENCOUNTER — Ambulatory Visit (INDEPENDENT_AMBULATORY_CARE_PROVIDER_SITE_OTHER): Payer: Medicare Other | Admitting: Nurse Practitioner

## 2022-04-24 ENCOUNTER — Encounter: Payer: Self-pay | Admitting: Nurse Practitioner

## 2022-04-24 DIAGNOSIS — U071 COVID-19: Secondary | ICD-10-CM | POA: Diagnosis not present

## 2022-04-24 MED ORDER — NIRMATRELVIR/RITONAVIR (PAXLOVID) TABLET (RENAL DOSING): 2.0000 | ORAL_TABLET | Freq: Two times a day (BID) | ORAL | 0 refills | Status: AC

## 2022-04-24 MED ORDER — BENZONATATE 100 MG PO CAPS: 100.0000 mg | ORAL_CAPSULE | Freq: Two times a day (BID) | ORAL | 0 refills | Status: DC | PRN

## 2022-04-24 NOTE — Assessment & Plan Note (Signed)
Tested positive for COVID today, cough started yesterday Start Paxlovid renal dosing 2 tablets twice daily for 5 days Tessalon 100 mg twice daily as needed cough ordered Patient told to stay on isolation for 5 days, wear mask when around others Proper hand hygiene encouraged Take Tylenol as needed for fever

## 2022-04-24 NOTE — Progress Notes (Signed)
Virtual Visit via Telephone Note  I connected with Jillian [name] @ on 04/24/22 at 12:40 PM EDT by telephone and verified that I am speaking with the correct person using two identifiers.  I spent 8 minutes on this telephone encounter  Location: Patient: home Provider: office   I discussed the limitations, risks, security and privacy concerns of performing an evaluation and management service by telephone and the availability of in person appointments. I also discussed with the patient that there may be a patient responsible charge related to this service. The patient expressed understanding and agreed to proceed.   History of Present Illness: Jillian [name] with past medical history of type 2 diabetes, essential hypertension, hyperlipidemia, presents with complaints of non productive Cough  that started started last night. Tested positive for COVID twice today.  Patient denies fever, chills, body aches, wheezing, shortness of breath. She is up to date with covid vaccines.   Observations/Objective:   Assessment and Plan: COVID-19 virus infection Tested positive for COVID today, cough started yesterday Start Paxlovid renal dosing 2 tablets twice daily for 5 days Tessalon 100 mg twice daily as needed cough ordered Patient told to stay on isolation for 5 days, wear mask when around others Proper hand hygiene encouraged Take Tylenol as needed for fever   Follow Up Instructions:    I discussed the assessment and treatment plan with the patient. The patient was provided an opportunity to ask questions and all were answered. The patient agreed with the plan and demonstrated an understanding of the instructions.   The patient was advised to call back or seek an in-person evaluation if the symptoms worsen or if the condition fails to improve as anticipated.

## 2022-05-07 DIAGNOSIS — I129 Hypertensive chronic kidney disease with stage 1 through stage 4 chronic kidney disease, or unspecified chronic kidney disease: Secondary | ICD-10-CM | POA: Diagnosis not present

## 2022-05-07 DIAGNOSIS — N189 Chronic kidney disease, unspecified: Secondary | ICD-10-CM | POA: Diagnosis not present

## 2022-05-07 DIAGNOSIS — E1122 Type 2 diabetes mellitus with diabetic chronic kidney disease: Secondary | ICD-10-CM | POA: Diagnosis not present

## 2022-05-07 DIAGNOSIS — D472 Monoclonal gammopathy: Secondary | ICD-10-CM | POA: Diagnosis not present

## 2022-05-14 DIAGNOSIS — D472 Monoclonal gammopathy: Secondary | ICD-10-CM | POA: Diagnosis not present

## 2022-05-14 DIAGNOSIS — N189 Chronic kidney disease, unspecified: Secondary | ICD-10-CM | POA: Diagnosis not present

## 2022-05-14 DIAGNOSIS — I129 Hypertensive chronic kidney disease with stage 1 through stage 4 chronic kidney disease, or unspecified chronic kidney disease: Secondary | ICD-10-CM | POA: Diagnosis not present

## 2022-05-14 DIAGNOSIS — N281 Cyst of kidney, acquired: Secondary | ICD-10-CM | POA: Diagnosis not present

## 2022-05-14 DIAGNOSIS — E1122 Type 2 diabetes mellitus with diabetic chronic kidney disease: Secondary | ICD-10-CM | POA: Diagnosis not present

## 2022-05-20 ENCOUNTER — Encounter: Payer: Self-pay | Admitting: Family Medicine

## 2022-05-20 ENCOUNTER — Ambulatory Visit (INDEPENDENT_AMBULATORY_CARE_PROVIDER_SITE_OTHER): Payer: Medicare Other | Admitting: Family Medicine

## 2022-05-20 VITALS — BP 138/78 | HR 66 | Ht 64.0 in | Wt 186.0 lb

## 2022-05-20 DIAGNOSIS — Z1211 Encounter for screening for malignant neoplasm of colon: Secondary | ICD-10-CM

## 2022-05-20 DIAGNOSIS — I10 Essential (primary) hypertension: Secondary | ICD-10-CM | POA: Diagnosis not present

## 2022-05-20 DIAGNOSIS — E785 Hyperlipidemia, unspecified: Secondary | ICD-10-CM | POA: Diagnosis not present

## 2022-05-20 DIAGNOSIS — E1159 Type 2 diabetes mellitus with other circulatory complications: Secondary | ICD-10-CM | POA: Diagnosis not present

## 2022-05-20 DIAGNOSIS — E559 Vitamin D deficiency, unspecified: Secondary | ICD-10-CM | POA: Diagnosis not present

## 2022-05-20 NOTE — Assessment & Plan Note (Signed)
Sub optimal though adequate control, reports better number ast home and recent visit to nephrology, excellent number, no med change DASH diet and commitment to daily physical activity for a minimum of 30 minutes discussed and encouraged, as a part of hypertension management. The importance of attaining a healthy weight is also discussed.     05/20/2022    8:28 AM 05/20/2022    8:23 AM 02/19/2022   11:42 AM 12/31/2021    8:41 AM 08/16/2021   10:09 AM 08/06/2021    9:08 AM 05/29/2021    9:16 AM  BP/Weight  Systolic BP 161 096 045 409 811 914 782  Diastolic BP 78 79 93 84 80 82 82  Wt. (Lbs)  186 184.75 186.08  189.12   BMI  31.93 kg/m2 30.97 kg/m2 31.94 kg/m2  32.46 kg/m2      Needs to start exercise

## 2022-05-20 NOTE — Progress Notes (Signed)
Jillian [name]     MRN: [id number]      DOB: 1941-03-06   HPI Jillian [name] is here for follow up and re-evaluation of chronic medical conditions, medication management and review of any available recent lab and radiology data.  Preventive health is updated, specifically  Cancer screening and Immunization.   Questions or concerns regarding consultations or procedures which the PT has had in the interim are  addressed. The PT denies any adverse reactions to current medications since the last visit.  There are no new concerns.  There are no specific complaints  Denies polyuria, polydipsia, blurred vision , or hypoglycemic episodes.   ROS Denies recent fever or chills. Denies sinus pressure, nasal congestion, ear pain or sore throat. Denies chest congestion, productive cough or wheezing. Denies chest pains, palpitations and leg swelling Denies abdominal pain, nausea, vomiting,diarrhea or constipation.   Denies dysuria, frequency, hesitancy or incontinence. Denies joint pain, swelling and limitation in mobility. Denies headaches, seizures, numbness, or tingling. Denies depression, anxiety or insomnia. Denies skin break down or rash.   PE  BP 138/78 (BP Location: Right Arm, Cuff Size: Large)   Pulse 66   Ht '5\' 4"'$  (1.626 m)   Wt 186 lb (84.4 kg)   SpO2 96%   BMI 31.93 kg/m   Patient alert and oriented and in no cardiopulmonary distress.  HEENT: No facial asymmetry, EOMI,     Neck supple .  Chest: Clear to auscultation bilaterally.  CVS: S1, S2 no murmurs, no S3.Regular rate.  ABD: Soft non tender.   Ext: No edema  MS: Adequate ROM spine, shoulders, hips and knees.  Skin: Intact, no ulcerations or rash noted.  Psych: Good eye contact, normal affect. Memory intact not anxious or depressed appearing.  CNS: CN 2-12 intact, power,  normal throughout.no focal deficits noted.   Assessment & Plan  ESSENTIAL HYPERTENSION, BENIGN Sub optimal though adequate control,  reports better number ast home and recent visit to nephrology, excellent number, no med change DASH diet and commitment to daily physical activity for a minimum of 30 minutes discussed and encouraged, as a part of hypertension management. The importance of attaining a healthy weight is also discussed.     05/20/2022    8:28 AM 05/20/2022    8:23 AM 02/19/2022   11:42 AM 12/31/2021    8:41 AM 08/16/2021   10:09 AM 08/06/2021    9:08 AM 05/29/2021    9:16 AM  BP/Weight  Systolic BP 267 124 580 998 338 250 539  Diastolic BP 78 79 93 84 80 82 82  Wt. (Lbs)  186 184.75 186.08  189.12   BMI  31.93 kg/m2 30.97 kg/m2 31.94 kg/m2  32.46 kg/m2      Needs to start exercise  Hyperlipidemia with target LDL less than 100 Hyperlipidemia:Low fat diet discussed and encouraged.   Lipid Panel  Lab Results  Component Value Date   CHOL 128 04/24/2021   HDL 45 04/24/2021   LDLCALC 62 04/24/2021   TRIG 103 04/24/2021   CHOLHDL 2.8 04/24/2021     Updated lab needed   Type 2 diabetes mellitus with vascular disease ([place]) Jillian [name] is reminded of the importance of commitment to daily physical activity for 30 minutes or more, as able and the need to limit carbohydrate intake to 30 to 60 grams per meal to help with blood sugar control.   The need to take medication as prescribed, test blood sugar as directed, and to call between  visits if there is a concern that blood sugar is uncontrolled is also discussed.   Jillian [name] is reminded of the importance of daily foot exam, annual eye examination, and good blood sugar, blood pressure and cholesterol control.     Latest Ref Rng & Units 08/06/2021    9:46 AM 04/24/2021    8:41 AM 02/06/2021    9:05 AM 11/26/2020    8:43 AM 07/09/2020   11:53 AM  Diabetic Labs  HbA1c 4.8 - 5.6 % 6.8  6.9   6.7  6.3   Microalbumin Not Estab. ug/mL  10.1      Micro/Creat Ratio 0 - 29 mg/g creat  5      Chol 0 - 200 mg/dL  128   150    HDL >40 mg/dL  45   52    Calc  LDL 0 - 99 mg/dL  62   79    Triglycerides <150 mg/dL  103   105    Creatinine 0.57 - 1.00 mg/dL 1.19  1.16  1.00  1.03        05/20/2022    8:28 AM 05/20/2022    8:23 AM 02/19/2022   11:42 AM 12/31/2021    8:41 AM 08/16/2021   10:09 AM 08/06/2021    9:08 AM 05/29/2021    9:16 AM  BP/Weight  Systolic BP 269 485 462 703 500 938 182  Diastolic BP 78 79 93 84 80 82 82  Wt. (Lbs)  186 184.75 186.08  189.12   BMI  31.93 kg/m2 30.97 kg/m2 31.94 kg/m2  32.46 kg/m2       Latest Ref Rng & Units 12/03/2021   12:00 AM 08/06/2021    9:00 AM  Foot/eye exam completion dates  Eye Exam No Retinopathy No Retinopathy       Foot Form Completion   Done     This result is from an external source.      Updated lab needed

## 2022-05-20 NOTE — Patient Instructions (Addendum)
Annual exam as before, call if you need me sooner  Labs today, lipid, hepatic, HBA1C, TSH and vit D  Please get TdaP at pharmacy if able  Arrange with nurse for flu vaccine  Nurse pls order cologuard  It is important that you exercise regularly at least 30 minutes 5 times a week. If you develop chest pain, have severe difficulty breathing, or feel very tired, stop exercising immediately and seek medical attention   Think about what you will eat, plan ahead. Choose " clean, green, fresh or frozen" over canned, processed or packaged foods which are more sugary, salty and fatty. 70 to 75% of food eaten should be vegetables and fruit. Three meals at set times with snacks allowed between meals, but they must be fruit or vegetables. Aim to eat over a 12 hour period , example 7 am to 7 pm, and STOP after  your last meal of the day. Drink water,generally about 64 ounces per day, no other drink is as healthy. Fruit juice is best enjoyed in a healthy way, by EATING the fruit.   Thanks for choosing [facility], we consider it a privelige to serve you.

## 2022-05-20 NOTE — Assessment & Plan Note (Signed)
Hyperlipidemia:Low fat diet discussed and encouraged.   Lipid Panel  Lab Results  Component Value Date   CHOL 128 04/24/2021   HDL 45 04/24/2021   LDLCALC 62 04/24/2021   TRIG 103 04/24/2021   CHOLHDL 2.8 04/24/2021     Updated lab needed

## 2022-05-20 NOTE — Assessment & Plan Note (Signed)
Jillian [name] is reminded of the importance of commitment to daily physical activity for 30 minutes or more, as able and the need to limit carbohydrate intake to 30 to 60 grams per meal to help with blood sugar control.   The need to take medication as prescribed, test blood sugar as directed, and to call between visits if there is a concern that blood sugar is uncontrolled is also discussed.   Jillian [name] is reminded of the importance of daily foot exam, annual eye examination, and good blood sugar, blood pressure and cholesterol control.     Latest Ref Rng & Units 08/06/2021    9:46 AM 04/24/2021    8:41 AM 02/06/2021    9:05 AM 11/26/2020    8:43 AM 07/09/2020   11:53 AM  Diabetic Labs  HbA1c 4.8 - 5.6 % 6.8  6.9   6.7  6.3   Microalbumin Not Estab. ug/mL  10.1      Micro/Creat Ratio 0 - 29 mg/g creat  5      Chol 0 - 200 mg/dL  128   150    HDL >40 mg/dL  45   52    Calc LDL 0 - 99 mg/dL  62   79    Triglycerides <150 mg/dL  103   105    Creatinine 0.57 - 1.00 mg/dL 1.19  1.16  1.00  1.03        05/20/2022    8:28 AM 05/20/2022    8:23 AM 02/19/2022   11:42 AM 12/31/2021    8:41 AM 08/16/2021   10:09 AM 08/06/2021    9:08 AM 05/29/2021    9:16 AM  BP/Weight  Systolic BP 482 500 370 488 891 694 503  Diastolic BP 78 79 93 84 80 82 82  Wt. (Lbs)  186 184.75 186.08  189.12   BMI  31.93 kg/m2 30.97 kg/m2 31.94 kg/m2  32.46 kg/m2       Latest Ref Rng & Units 12/03/2021   12:00 AM 08/06/2021    9:00 AM  Foot/eye exam completion dates  Eye Exam No Retinopathy No Retinopathy       Foot Form Completion   Done     This result is from an external source.      Updated lab needed

## 2022-05-21 ENCOUNTER — Encounter: Payer: Self-pay | Admitting: Family Medicine

## 2022-05-21 LAB — HEPATIC FUNCTION PANEL
ALT: 6 IU/L (ref 0–32)
AST: 6 IU/L (ref 0–40)
Albumin: 4.1 g/dL (ref 3.8–4.8)
Alkaline Phosphatase: 87 IU/L (ref 44–121)
Bilirubin Total: 0.3 mg/dL (ref 0.0–1.2)
Bilirubin, Direct: 0.11 mg/dL (ref 0.00–0.40)
Total Protein: 6.7 g/dL (ref 6.0–8.5)

## 2022-05-21 LAB — LIPID PANEL
Chol/HDL Ratio: 3.5 ratio (ref 0.0–4.4)
Cholesterol, Total: 152 mg/dL (ref 100–199)
HDL: 44 mg/dL (ref >39)
LDL Chol Calc (NIH): 90 mg/dL (ref 0–99)
Triglycerides: 94 mg/dL (ref 0–149)
VLDL Cholesterol Cal: 18 mg/dL (ref 5–40)

## 2022-05-21 LAB — TSH: TSH: 1.56 u[IU]/mL (ref 0.450–4.500)

## 2022-05-21 LAB — HEMOGLOBIN A1C
Est. average glucose Bld gHb Est-mCnc: 143 mg/dL
Hgb A1c MFr Bld: 6.6 % — ABNORMAL HIGH (ref 4.8–5.6)

## 2022-05-21 LAB — VITAMIN D 25 HYDROXY (VIT D DEFICIENCY, FRACTURES): Vit D, 25-Hydroxy: 44.8 ng/mL (ref 30.0–100.0)

## 2022-06-02 LAB — COLOGUARD

## 2022-06-05 ENCOUNTER — Ambulatory Visit
Admission: EM | Admit: 2022-06-05 | Discharge: 2022-06-05 | Disposition: A | Discharge status: Home / Self Care | Payer: Medicare Other

## 2022-06-05 DIAGNOSIS — S99929A Unspecified injury of unspecified foot, initial encounter: Secondary | ICD-10-CM

## 2022-06-05 DIAGNOSIS — L609 Nail disorder, unspecified: Secondary | ICD-10-CM

## 2022-06-05 NOTE — ED Triage Notes (Addendum)
Pt presents with c/o her L fourth toe nail coming off. PT denies any bleeding Pt denies any  discomfort or pain. States she is diabetic and is concerned. Pt denies hitting her toe against anything.

## 2022-06-05 NOTE — ED Provider Notes (Signed)
[facility]    CSN: [id number] Arrival date & time: 06/05/22  0948      History   Chief Complaint Chief Complaint  Patient presents with   Nail Problem    HPI Jillian [name] is a 81 y.o. female.   Patient presents for concerns regarding her left fourth toe.  Reports the nail broke on it a couple of days ago and she has a history of diabetes, so is concerned for infection.  No recent trauma to the toe nail that she is aware of.  She denies any redness, pain, swelling, numbness or tingling in her foot or toes.  No fevers or nausea/vomiting.  Has not taken anything for the problem so far.      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   [facility]ospital[facility] at [facility]   Hypertension 2005   Mild obesity    Postmenopausal    Rectal bleeding    Viral meningitis    Wears glasses     Patient Active Problem List   Diagnosis Date Noted   COVID-19 virus infection 04/24/2022   CKD (chronic kidney disease) stage 3, GFR 30-59 ml/min (HCC) 05/29/2021   Chronic heel pain, left 01/22/2021   Allergic reaction to drug 07/12/2020   Patellofemoral arthralgia of both knees 12/07/2019   Diverticulosis of colon 06/14/2019   Pancreatic lesion 06/14/2019   Obese 02/06/2019   Thoracic back pain 11/08/2018   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   Thrombocytopenia (HCC) 06/11/2018   Back pain with right-sided radiculopathy 04/28/2018   Impingement syndrome of left shoulder region 11/09/2017   Chronic left SI joint pain 05/21/2016   Primary osteoarthritis of both knees 06/26/2015   Papilloma of right breast 09/29/2014   Thoracic spine pain 09/28/2014   Hip pain 08/06/2012   Cervical neck pain with evidence of disc disease 12/17/2011   Hot flashes, menopausal 09/10/2011   Type 2  diabetes mellitus with vascular disease (HCC) 01/08/2010   Hyperlipidemia with target LDL less than 100 10/12/2007   ESSENTIAL HYPERTENSION, BENIGN 10/12/2007    Past Surgical History:  Procedure Laterality Date   ABDOMINAL HYSTERECTOMY  1992   fibroids   APPENDECTOMY     BREAST EXCISIONAL BIOPSY Right    benign   BREAST LUMPECTOMY WITH RADIOACTIVE SEED LOCALIZATION Right 10/05/2014   Procedure: BREAST LUMPECTOMY WITH RADIOACTIVE SEED LOCALIZATION;  Surgeon: Harriette Bouillon, MD;  Location:  SURGERY CENTER;  Service: General;  Laterality: Right;   cataract Bilateral    COLONOSCOPY  2013   Dr. Darrick Penna :  internal hemorrhoids, mild left-sided diverticulosis   COLONOSCOPY N/A 09/21/2019   Procedure: COLONOSCOPY;  Surgeon: West Bali, MD;  Location: [facility];  Service: Endoscopy;  Laterality: N/A;  12:00pm   CYST EXCISION Right    arm   ESOPHAGOGASTRODUODENOSCOPY  2013   Dr. Darrick Penna: gastritis likely NSAID-induced. Duodenal polyp. Duodenitis and gastritis in setting of aspirin and Ibuprofen.    EYE SURGERY Right 06/07/2013   cataract   EYE SURGERY Left 09/14/2013   cataract   KNEE ARTHROSCOPY Right 02/26/2016   Procedure: ARTHROSCOPY RIGHT KNEE WITH MENSICAL DEBRIDEMENT;  Surgeon: Ollen Gross, MD;  Location: [facility];  Service: Orthopedics;  Laterality: Right;  left breast biopsy     LESION REMOVAL Right 03/30/2013   Procedure: EXCISION OF NEOPLASM ARM;  Surgeon: Jamesetta So, MD;  Location: [facility];  Service: General;  Laterality: Right;   TUBAL LIGATION      OB History     Gravida  2   Para  2   Term  2   Preterm      AB      Living         SAB      IAB      Ectopic      Multiple      Live Births               Home Medications    Prior to Admission medications   Medication Sig Start Date End Date Taking? Authorizing Provider  acetaminophen (TYLENOL) 500 MG tablet Take 1,000 mg by mouth every 6 (six) hours as needed for mild pain.     [provider]  aspirin EC 81 MG tablet Take 1 tablet (81 mg total) by mouth every evening. Patient taking differently: Take 81 mg by mouth at bedtime. 04/23/16   Fayrene Helper, MD  benazepril (LOTENSIN) 5 MG tablet Take 1 tablet (5 mg total) by mouth daily. 02/13/22   Fayrene Helper, MD  Blood Glucose Monitoring Suppl (TRUE METRIX METER) w/Device KIT USE AS DIRECTED 01/30/21   Fayrene Helper, MD  cholecalciferol (VITAMIN D3) 25 MCG (1000 UNIT) tablet Take 2 tablets (2,000 Units total) by mouth daily. 12/31/21   Fayrene Helper, MD  cloNIDine (CATAPRES) 0.3 MG tablet TAKE 1 TABLET BY MOUTH IN THE  EVENING AT 8 PM (STOP 0.$RemoveBe'2MG'[id number]$   TABLET) 03/26/22   Fayrene Helper, MD  linagliptin (TRADJENTA) 5 MG TABS tablet Take 1 tablet (5 mg total) by mouth daily. 12/31/21   Fayrene Helper, MD  pravastatin (PRAVACHOL) 10 MG tablet TAKE 1 TABLET BY MOUTH DAILY 03/04/22   Fayrene Helper, MD  TRUE METRIX BLOOD GLUCOSE TEST test strip TEST ONE TIME DAILY 09/17/21   Fayrene Helper, MD    Family History Family History  Problem Relation Age of Onset   Hypertension Father    Congestive Heart Failure Father    Liver cancer Mother    Hypertension Sister    Hypertension Brother    Diabetes Brother    Colon cancer Neg Hx    Colon polyps Neg Hx     Social History Social History   Tobacco Use   Smoking status: Never   Smokeless tobacco: Never  Vaping Use   Vaping Use: Never used  Substance Use Topics   Alcohol use: No   Drug use: No     Allergies   Ciprofloxacin, Metronidazole, Codeine, Propoxyphene, and Propoxyphene n-acetaminophen   Review of Systems Review of Systems Per HPI  Physical Exam Triage Vital Signs ED Triage Vitals  Enc Vitals Group     BP 06/05/22 1004 117/74     Pulse Rate 06/05/22 1004 89     Resp 06/05/22 1004 16     Temp 06/05/22 1004 98 F (36.7 C)     Temp Source 06/05/22 1004 Oral     SpO2 06/05/22 1004 97 %     Weight --       Height --      Head Circumference --      Peak Flow --      Pain Score 06/05/22 1002 0  Pain Loc --      Pain Edu? --      Excl. in [place]? --    No data found.  Updated Vital Signs BP 117/74 (BP Location: Right Arm)   Pulse 89   Temp 98 F (36.7 C) (Oral)   Resp 16   SpO2 97%   Visual Acuity Right Eye Distance:   Left Eye Distance:   Bilateral Distance:    Right Eye Near:   Left Eye Near:    Bilateral Near:     Physical Exam Vitals and nursing note reviewed.  Constitutional:      General: She is not in acute distress.    Appearance: Normal appearance. She is not toxic-appearing.  HENT:     Mouth/Throat:     Mouth: Mucous membranes are moist.     Pharynx: Oropharynx is clear.  Pulmonary:     Effort: Pulmonary effort is normal. No respiratory distress.  Feet:     Left foot:     Skin integrity: Skin integrity normal.     Comments: Left fourth toe nail intact; no redness, swelling, bruising, drainage, or tenderness to palpation.  Protective sensation intact to sole of left foot.  Full range of motion.  Left fourth toe nail does appear shorter than the others.  Skin:    General: Skin is warm and dry.     Capillary Refill: Capillary refill takes less than 2 seconds.     Coloration: Skin is not jaundiced or pale.     Findings: No erythema.  Neurological:     Mental Status: She is alert and oriented to person, place, and time.  Psychiatric:        Behavior: Behavior is cooperative.      UC Treatments / Results  Labs (all labs ordered are listed, but only abnormal results are displayed) Labs Reviewed - No data to display  EKG   Radiology No results found.  Procedures Procedures (including critical care time)  Medications Ordered in UC Medications - No data to display  Initial Impression / Assessment and Plan / UC Course  I have reviewed the triage vital signs and the nursing notes.  Pertinent labs & imaging results that were available during my care  of the patient were reviewed by me and considered in my medical decision making (see chart for details).    Patient is well-appearing, normotensive, afebrile, not tachycardic, not tachypneic, oxygenating well on room air.  Examination is reassuring.  Discussed with patient that she does not have signs or symptoms of toenail infection today.  Return precautions and ER precautions discussed. Final Clinical Impressions(s) / UC Diagnoses   Final diagnoses:  Injury of nail bed of toe     Discharge Instructions      - Your fourth toe nail appears to be broken off.  This will grow back with time.  There are no signs of infection in or around your toe nail today. - If you develop pain redness, oozing, drainage, or warmth in the toe, please come back to see [place]    ED Prescriptions   None    PDMP not reviewed this encounter.   Eulogio Bear, NP 06/05/22 1233

## 2022-06-05 NOTE — Discharge Instructions (Signed)
-   Your fourth toe nail appears to be broken off.  This will grow back with time.  There are no signs of infection in or around your toe nail today. - If you develop pain redness, oozing, drainage, or warmth in the toe, please come back to see [place]

## 2022-06-12 DIAGNOSIS — M25469 Effusion, unspecified knee: Secondary | ICD-10-CM | POA: Diagnosis not present

## 2022-06-12 DIAGNOSIS — I10 Essential (primary) hypertension: Secondary | ICD-10-CM | POA: Diagnosis not present

## 2022-06-20 LAB — COLOGUARD

## 2022-06-26 DIAGNOSIS — M170 Bilateral primary osteoarthritis of knee: Secondary | ICD-10-CM | POA: Diagnosis not present

## 2022-07-14 LAB — COLOGUARD

## 2022-07-15 ENCOUNTER — Other Ambulatory Visit: Payer: Self-pay

## 2022-08-05 ENCOUNTER — Telehealth: Payer: Self-pay | Admitting: Family Medicine

## 2022-08-05 ENCOUNTER — Other Ambulatory Visit: Payer: Self-pay

## 2022-08-05 DIAGNOSIS — Z1211 Encounter for screening for malignant neoplasm of colon: Secondary | ICD-10-CM

## 2022-08-05 NOTE — Telephone Encounter (Signed)
Fit test kit put up front for pickup patient aware.

## 2022-08-05 NOTE — Telephone Encounter (Signed)
Pt called stating their still has been issue with the mail in cologuard. She is wanting to know if she can come and pick up a test from here and bring back to our lab?

## 2022-08-11 ENCOUNTER — Other Ambulatory Visit: Payer: Self-pay | Admitting: Family Medicine

## 2022-08-11 ENCOUNTER — Telehealth: Payer: Self-pay | Admitting: Family Medicine

## 2022-08-11 NOTE — Telephone Encounter (Signed)
Patien need med refill  benazepril (LOTENSIN) 5 MG tablet [[id number]]   Pharmacy: [facility] [address] [facility]

## 2022-08-11 NOTE — Telephone Encounter (Signed)
Med refilled today 08/11/22

## 2022-08-18 ENCOUNTER — Other Ambulatory Visit: Payer: Self-pay

## 2022-08-18 DIAGNOSIS — Z1211 Encounter for screening for malignant neoplasm of colon: Secondary | ICD-10-CM | POA: Diagnosis not present

## 2022-08-19 ENCOUNTER — Telehealth: Payer: Self-pay | Admitting: Family Medicine

## 2022-08-19 ENCOUNTER — Encounter: Payer: Self-pay | Admitting: Family Medicine

## 2022-08-19 ENCOUNTER — Ambulatory Visit (INDEPENDENT_AMBULATORY_CARE_PROVIDER_SITE_OTHER): Payer: Medicare Other | Admitting: Family Medicine

## 2022-08-19 VITALS — BP 128/82 | HR 69 | Ht 64.0 in | Wt 189.0 lb

## 2022-08-19 DIAGNOSIS — E1121 Type 2 diabetes mellitus with diabetic nephropathy: Secondary | ICD-10-CM

## 2022-08-19 DIAGNOSIS — M546 Pain in thoracic spine: Secondary | ICD-10-CM

## 2022-08-19 DIAGNOSIS — T733XXA Exhaustion due to excessive exertion, initial encounter: Secondary | ICD-10-CM

## 2022-08-19 DIAGNOSIS — E1159 Type 2 diabetes mellitus with other circulatory complications: Secondary | ICD-10-CM | POA: Diagnosis not present

## 2022-08-19 DIAGNOSIS — Z0001 Encounter for general adult medical examination with abnormal findings: Secondary | ICD-10-CM | POA: Diagnosis not present

## 2022-08-19 NOTE — Assessment & Plan Note (Addendum)
Feels " tired " after standing in thoracic area, obtain X ray

## 2022-08-19 NOTE — Patient Instructions (Addendum)
F/u mid April, cll if you need me sooner  Please schedule DEXA at checkout.  Nurse please follow-up with medication cost,  as patient is reporting Tradjenta to being extremely expensive costing over $100 per month.  An x-ray of your mid back is ordered I recommend that you get this within the next week as we discussed.  I do recommend cardiology evaluation  because of increased fatigue with activity, please do   get back in touch with me if you decide to go through with this.  It is important to establish whether or not you have any significant heart disease that needs attention.  Nonfasting HBa1Cis due first week in January.  Fasting lipid's CMP and EGFR and HbA1c to be done 1 week before your follow-up appointment in April.  You have been referred to the diabetic educator for assistance with food choices and portion control as you see to continue to control your blood sugar as well as you have been doing.  Thanks for choosing [facility], we consider it a privelige to serve you.

## 2022-08-19 NOTE — Assessment & Plan Note (Signed)
Cost of tradjenta challenging, will try to get at an affordable price Jillian [name] is reminded of the importance of commitment to daily physical activity for 30 minutes or more, as able and the need to limit carbohydrate intake to 30 to 60 grams per meal to help with blood sugar control.   The need to take medication as prescribed, test blood sugar as directed, and to call between visits if there is a concern that blood sugar is uncontrolled is also discussed.   Jillian [name] is reminded of the importance of daily foot exam, annual eye examination, and good blood sugar, blood pressure and cholesterol control.     Latest Ref Rng & Units 05/20/2022    9:18 AM 08/06/2021    9:46 AM 04/24/2021    8:41 AM 02/06/2021    9:05 AM 11/26/2020    8:43 AM  Diabetic Labs  HbA1c 4.8 - 5.6 % 6.6  6.8  6.9   6.7   Microalbumin Not Estab. ug/mL   10.1     Micro/Creat Ratio 0 - 29 mg/g creat   5     Chol 100 - 199 mg/dL 152   128   150   HDL >39 mg/dL 44   45   52   Calc LDL 0 - 99 mg/dL 90   62   79   Triglycerides 0 - 149 mg/dL 94   103   105   Creatinine 0.57 - 1.00 mg/dL  1.19  1.16  1.00  1.03       08/19/2022    9:55 AM 06/05/2022   10:04 AM 05/20/2022    8:28 AM 05/20/2022    8:23 AM 02/19/2022   11:42 AM 12/31/2021    8:41 AM 08/16/2021   10:09 AM  BP/Weight  Systolic BP 834 196 222 979 892 119 417  Diastolic BP 82 74 78 79 93 84 80  Wt. (Lbs) 189   186 184.75 186.08   BMI 32.44 kg/m2   31.93 kg/m2 30.97 kg/m2 31.94 kg/m2       Latest Ref Rng & Units 08/19/2022    9:40 AM 12/03/2021   12:00 AM  Foot/eye exam completion dates  Eye Exam No Retinopathy  No Retinopathy      Foot Form Completion  Done      This result is from an external source.

## 2022-08-19 NOTE — Progress Notes (Signed)
Jillian [name]     MRN: [id number]      DOB: 11/17/1940  HPI: Patient is in for annual physical exam. C/o "tired feeling " in mid back after standing for a while C/o reduced exercise tolerance,  gets tired after a 15 minute walk, in past approx 4 months, denies chest pain or light headedness,nointerst in and currently declining recommendation for Cardiology eval . Immunization is reviewed , and  is up to date   PE: BP 128/82 (BP Location: Right Arm, Patient Position: Sitting, Cuff Size: Normal)   Pulse 69   Ht '5\' 4"'$  (1.626 m)   Wt 189 lb (85.7 kg)   SpO2 97%   BMI 32.44 kg/m   Pleasant  female, alert and oriented x 3, in no cardio-pulmonary distress. Afebrile. HEENT No facial trauma or asymetry. Sinuses non tender.  Extra occullar muscles intact.. External ears normal, . Neck: supple, no adenopathy,JVD or thyromegaly.No bruits.  Chest: Clear to ascultation bilaterally.No crackles or wheezes. Non tender to palpation  y  Cardiovascular system; Heart sounds normal,  S1 and  S2 ,no S3.  No murmur, or thrill. Apical beat not displaced Peripheral pulses normal.  Abdomen: Soft, non tender, no organomegaly or masses. No bruits. Bowel sounds normal. No guarding, tenderness or rebound.    Musculoskeletal exam: Full ROM of spine, hips , shoulders and knees. Tender over mid thoracic spine No deformity ,swelling or crepitus noted. No muscle wasting or atrophy.   Neurologic: Cranial nerves 2 to 12 intact. Power, tone ,sensation  normal throughout. No disturbance in gait. No tremor.  Skin: Intact, no ulceration, erythema , scaling or rash noted. Pigmentation normal throughout  Psych; Normal mood and affect. Judgement and concentration normal   Assessment & Plan:  Thoracic spine pain Feels " tired " after standing in thoracic area, obtain X ray  Type 2 diabetes mellitus with vascular disease ([place]) Cost of tradjenta challenging, will try to get at an  affordable price Jillian [name] is reminded of the importance of commitment to daily physical activity for 30 minutes or more, as able and the need to limit carbohydrate intake to 30 to 60 grams per meal to help with blood sugar control.   The need to take medication as prescribed, test blood sugar as directed, and to call between visits if there is a concern that blood sugar is uncontrolled is also discussed.   Jillian [name] is reminded of the importance of daily foot exam, annual eye examination, and good blood sugar, blood pressure and cholesterol control.     Latest Ref Rng & Units 05/20/2022    9:18 AM 08/06/2021    9:46 AM 04/24/2021    8:41 AM 02/06/2021    9:05 AM 11/26/2020    8:43 AM  Diabetic Labs  HbA1c 4.8 - 5.6 % 6.6  6.8  6.9   6.7   Microalbumin Not Estab. ug/mL   10.1     Micro/Creat Ratio 0 - 29 mg/g creat   5     Chol 100 - 199 mg/dL 152   128   150   HDL >39 mg/dL 44   45   52   Calc LDL 0 - 99 mg/dL 90   62   79   Triglycerides 0 - 149 mg/dL 94   103   105   Creatinine 0.57 - 1.00 mg/dL  1.19  1.16  1.00  1.03       08/19/2022    9:55 AM  06/05/2022   10:04 AM 05/20/2022    8:28 AM 05/20/2022    8:23 AM 02/19/2022   11:42 AM 12/31/2021    8:41 AM 08/16/2021   10:09 AM  BP/Weight  Systolic BP 130 865 784 696 295 284 132  Diastolic BP 82 74 78 79 93 84 80  Wt. (Lbs) 189   186 184.75 186.08   BMI 32.44 kg/m2   31.93 kg/m2 30.97 kg/m2 31.94 kg/m2       Latest Ref Rng & Units 08/19/2022    9:40 AM 12/03/2021   12:00 AM  Foot/eye exam completion dates  Eye Exam No Retinopathy  No Retinopathy      Foot Form Completion  Done      This result is from an external source.        Fatigue due to excessive exertion New onset exertional fatigue after walking 15 mins, recommend cardiovascular eval, states she " will think about this" I explained her increased risk for CAD , diabetes , HTN ,hyperlipidemia, metabolic synd. Will lt me know if she decides on eval  Annual  visit for general adult medical examination with abnormal findings Annual exam as documented. Counseling done  re healthy lifestyle involving commitment to 150 minutes exercise per week, heart healthy diet, and attaining healthy weight.The importance of adequate sleep also discussed. Regular seat belt use and home safety, is also discussed. Changes in health habits are decided on by the patient with goals and time frames  set for achieving them. Immunization and cancer screening needs are specifically addressed at this visit.

## 2022-08-19 NOTE — Assessment & Plan Note (Signed)
New onset exertional fatigue after walking 15 mins, recommend cardiovascular eval, states she " will think about this" I explained her increased risk for CAD , diabetes , HTN ,hyperlipidemia, metabolic synd. Will lt me know if she decides on eval

## 2022-08-19 NOTE — Assessment & Plan Note (Signed)

## 2022-08-19 NOTE — Telephone Encounter (Signed)
Pt reports tradjenta is over $100/ month, please check this is so with her pharmacy and see if we can get pt assistance for lower cost , she does have CKD which is why tadjent is prescribed

## 2022-08-20 LAB — FECAL OCCULT BLOOD, IMMUNOCHEMICAL: Fecal Occult Bld: NEGATIVE

## 2022-08-25 ENCOUNTER — Ambulatory Visit (HOSPITAL_COMMUNITY)
Admission: RE | Admit: 2022-08-25 | Discharge: 2022-08-25 | Disposition: A | Discharge status: Home / Self Care | Payer: Medicare Other | Source: Ambulatory Visit | Attending: Family Medicine | Admitting: Family Medicine

## 2022-08-25 DIAGNOSIS — Z780 Asymptomatic menopausal state: Secondary | ICD-10-CM | POA: Diagnosis not present

## 2022-08-25 DIAGNOSIS — M85852 Other specified disorders of bone density and structure, left thigh: Secondary | ICD-10-CM | POA: Diagnosis not present

## 2022-08-26 ENCOUNTER — Telehealth: Payer: Self-pay | Admitting: Family Medicine

## 2022-08-26 NOTE — Telephone Encounter (Signed)
Patient aware of lab results.

## 2022-08-26 NOTE — Telephone Encounter (Signed)
Patient returning call to nurse  

## 2022-08-28 NOTE — Telephone Encounter (Signed)
I attempted a PA and it came back unable to process- because tradjenta was on the list of covered drugs. She may be in the donut hole until the first of the year

## 2022-08-28 NOTE — Telephone Encounter (Signed)
Will attempt to speak with someone at [facility] to find out why the cost is so much.

## 2022-10-05 ENCOUNTER — Telehealth: Payer: Self-pay | Admitting: Family Medicine

## 2022-10-05 DIAGNOSIS — M549 Dorsalgia, unspecified: Secondary | ICD-10-CM

## 2022-10-05 NOTE — Telephone Encounter (Signed)
Pls contact pt. Labs were ordered since 07/2022 and still not done, past due non fasting HBA1C, chem 7 and EGFR also needs microalb done( needs to be ordered) Also pls get  f/u on tradjenta, she had stated cost is prohibitive

## 2022-10-05 NOTE — Telephone Encounter (Signed)
Correction , she does not need microalb, already done by nephrology

## 2022-10-07 ENCOUNTER — Other Ambulatory Visit: Payer: Self-pay

## 2022-10-07 DIAGNOSIS — E1121 Type 2 diabetes mellitus with diabetic nephropathy: Secondary | ICD-10-CM

## 2022-10-07 NOTE — Telephone Encounter (Signed)
Patient aware labs ordered for [facility] per patient request will have labs done next week

## 2022-10-08 DIAGNOSIS — E1122 Type 2 diabetes mellitus with diabetic chronic kidney disease: Secondary | ICD-10-CM | POA: Diagnosis not present

## 2022-10-08 DIAGNOSIS — N189 Chronic kidney disease, unspecified: Secondary | ICD-10-CM | POA: Diagnosis not present

## 2022-10-08 DIAGNOSIS — N281 Cyst of kidney, acquired: Secondary | ICD-10-CM | POA: Diagnosis not present

## 2022-10-08 DIAGNOSIS — E1121 Type 2 diabetes mellitus with diabetic nephropathy: Secondary | ICD-10-CM | POA: Diagnosis not present

## 2022-10-08 DIAGNOSIS — I129 Hypertensive chronic kidney disease with stage 1 through stage 4 chronic kidney disease, or unspecified chronic kidney disease: Secondary | ICD-10-CM | POA: Diagnosis not present

## 2022-10-08 DIAGNOSIS — D472 Monoclonal gammopathy: Secondary | ICD-10-CM | POA: Diagnosis not present

## 2022-10-09 LAB — BASIC METABOLIC PANEL WITH GFR
BUN/Creatinine Ratio: 9 (calc) (ref 6–22)
BUN: 10 mg/dL (ref 7–25)
CO2: 25 mmol/L (ref 20–32)
Calcium: 9.5 mg/dL (ref 8.6–10.4)
Chloride: 107 mmol/L (ref 98–110)
Creat: 1.12 mg/dL — ABNORMAL HIGH (ref 0.60–0.95)
Glucose, Bld: 100 mg/dL — ABNORMAL HIGH (ref 65–99)
Potassium: 4.4 mmol/L (ref 3.5–5.3)
Sodium: 142 mmol/L (ref 135–146)
eGFR: 50 mL/min/{1.73_m2} — ABNORMAL LOW (ref >=60)

## 2022-10-09 LAB — HEMOGLOBIN A1C
Hgb A1c MFr Bld: 6.9 % of total Hgb — ABNORMAL HIGH (ref <5.7)
Mean Plasma Glucose: 151 mg/dL
eAG (mmol/L): 8.4 mmol/L

## 2022-10-17 DIAGNOSIS — I129 Hypertensive chronic kidney disease with stage 1 through stage 4 chronic kidney disease, or unspecified chronic kidney disease: Secondary | ICD-10-CM | POA: Diagnosis not present

## 2022-10-17 DIAGNOSIS — R3914 Feeling of incomplete bladder emptying: Secondary | ICD-10-CM | POA: Diagnosis not present

## 2022-10-17 DIAGNOSIS — N189 Chronic kidney disease, unspecified: Secondary | ICD-10-CM | POA: Diagnosis not present

## 2022-10-17 DIAGNOSIS — E1122 Type 2 diabetes mellitus with diabetic chronic kidney disease: Secondary | ICD-10-CM | POA: Diagnosis not present

## 2022-10-21 ENCOUNTER — Encounter: Payer: Medicare Other | Attending: Family Medicine | Admitting: Nutrition

## 2022-10-21 ENCOUNTER — Encounter: Payer: Self-pay | Admitting: Nutrition

## 2022-10-21 VITALS — Ht 64.0 in | Wt 187.0 lb

## 2022-10-21 DIAGNOSIS — E1159 Type 2 diabetes mellitus with other circulatory complications: Secondary | ICD-10-CM

## 2022-10-21 DIAGNOSIS — I10 Essential (primary) hypertension: Secondary | ICD-10-CM | POA: Diagnosis not present

## 2022-10-21 NOTE — Patient Instructions (Signed)
Goals  Cut out popcorn, Eat three meals per day' B) 6-8, L)12-2 D)5-7 Increase more plant based foods Walk 10 minutes 3 times per day. Get A1C  down to 6% or below

## 2022-10-21 NOTE — Progress Notes (Signed)
Medical Nutrition Therapy  Appointment Start time:  0800  Appointment End time:  0900  Primary concerns today: Dm Type 2, obesity  Referral diagnosis: E11.8 Preferred learning style: no preference  Learning readiness: Ready    NUTRITION ASSESSMENT  82 yr old bfemale here with her sister. Wants to continue to improve her DM and get off some medications. Her A1C  recently increased from 6.6% to 6.9%. She notes she eats 2 meals per day and skips dinner. She just eats cheesy popcorn in the evening.  PMH: Dm Type 2, Obesity,  FBS 135 mg/dl Usual 99--130's. Doesn't check blood sugars every day. PCP Dr. Berle Mull some at home and eats out some. 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 Is physically active.   She is willing to make some changes with her eating  habits to improver her DM and overall health. Night time snacking is contributing to her elevated blood sugars.   Anthropometrics  Wt Readings from Last 3 Encounters:  10/21/22 187 lb (84.8 kg)  08/19/22 189 lb (85.7 kg)  05/20/22 186 lb (84.4 kg)   Ht Readings from Last 3 Encounters:  10/21/22 '5\' 4"'$  (1.626 m)  08/19/22 '5\' 4"'$  (1.626 m)  05/20/22 '5\' 4"'$  (1.626 m)   Body mass index is 32.1 kg/m. '@BMIFA'$ @ Facility age limit for growth %iles is 20 years. Facility age limit for growth %iles is 20 years.    Clinical Medical Hx: See chart Medications: Tradjenta Labs:  Lab Results  Component Value Date   HGBA1C 6.9 (H) 10/08/2022      Latest Ref Rng & Units 10/08/2022   11:50 AM 05/20/2022    9:18 AM 08/06/2021    9:46 AM  CMP  Glucose 65 - 99 mg/dL 100   112   BUN 7 - 25 mg/dL 10   9   Creatinine 0.60 - 0.95 mg/dL 1.12   1.19   Sodium 135 - 146 mmol/L 142   144   Potassium 3.5 - 5.3 mmol/L 4.4   4.6   Chloride 98 - 110 mmol/L 107   107   CO2 20 - 32 mmol/L 25   23   Calcium 8.6 - 10.4 mg/dL 9.5   9.6   Total Protein 6.0 - 8.5 g/dL  6.7    Total Bilirubin 0.0 - 1.2 mg/dL  0.3    Alkaline Phos 44 - 121 IU/L  87     AST 0 - 40 IU/L  6    ALT 0 - 32 IU/L  6     Lipid Panel     Component Value Date/Time   CHOL 152 05/20/2022 0918   TRIG 94 05/20/2022 0918   HDL 44 05/20/2022 0918   CHOLHDL 3.5 05/20/2022 0918   CHOLHDL 2.8 04/24/2021 0841   VLDL 21 04/24/2021 0841   LDLCALC 90 05/20/2022 0918   LDLCALC 72 07/03/2020 0842   LABVLDL 18 05/20/2022 0918    Notable Signs/Symptoms: None  Lifestyle & Dietary Hx Lives with her son and she takes care of rest home.  Estimated daily fluid intake: 40 oz Supplements: VIt D Sleep: 6 hrs Stress / self-care:  Current average weekly physical activity: ADL  24-Hr Dietary Recall Eats breakfast and lunch. Skips dinner but usually grazes or snacks on popcorn most of the night.  Estimated Energy Needs Calories: 1500 Carbohydrate: 170g Protein: 112g Fat: 42g   NUTRITION DIAGNOSIS  NB-1.1 Food and nutrition-related knowledge deficit As related to Diabetes Type 2.  As evidenced  by A1C 6.9%. .   NUTRITION INTERVENTION  Nutrition education (E-1) on the following topics:  Nutrition and Diabetes education provided on My Plate, CHO counting, meal planning, portion sizes, timing of meals, avoiding snacks between meals unless having a low blood sugar, target ranges for A1C and blood sugars, signs/symptoms and treatment of hyper/hypoglycemia, monitoring blood sugars, taking medications as prescribed, benefits of exercising 30 minutes per day and prevention of complications of DM.  Lifestyle Medicine  - Whole Food, Plant Predominant Nutrition is highly recommended: Eat Plenty of vegetables, Mushrooms, fruits, Legumes, Whole Grains, Nuts, seeds in lieu of processed meats, processed snacks/pastries red meat, poultry, eggs.    -It is better to avoid simple carbohydrates including: Cakes, Sweet Desserts, Ice Cream, Soda (diet and regular), Sweet Tea, Candies, Chips, Cookies, Store Bought Juices, Alcohol in Excess of  1-2 drinks a day, Lemonade,  Artificial  Sweeteners, Doughnuts, Coffee Creamers, "Sugar-free" Products, etc, etc.  This is not a complete list.....  Exercise: If you are able: 30 -60 minutes a day ,4 days a week, or 150 minutes a week.  The longer the better.  Combine stretch, strength, and aerobic activities.  If you were told in the past that you have high risk for cardiovascular diseases, you may seek evaluation by your heart doctor prior to initiating moderate to intense exercise programs.   Handouts Provided Include  Lifestyle Medicine  Learning Style & Readiness for Change Teaching method utilized: Visual & Auditory  Demonstrated degree of understanding via: Teach Back  Barriers to learning/adherence to lifestyle change: none  Goals Established by Pt Goals  Cut out popcorn, Eat three meals per day' B) 6-8, L)12-2 D)5-7 Increase more plant based foods Walk 10 minutes 3 times per day. Get A1C  down to 6% or below   MONITORING & EVALUATION Dietary intake, weekly physical activity, in 2-3 months.  Next Steps  Patient is to work on eating more plant based meals on schedule.Marland Kitchen

## 2022-10-30 ENCOUNTER — Ambulatory Visit (HOSPITAL_COMMUNITY)
Admission: RE | Admit: 2022-10-30 | Discharge: 2022-10-30 | Disposition: A | Discharge status: Home / Self Care | Payer: Medicare Other | Source: Ambulatory Visit | Attending: Family Medicine | Admitting: Family Medicine

## 2022-10-30 ENCOUNTER — Encounter: Payer: Self-pay | Admitting: Family Medicine

## 2022-10-30 ENCOUNTER — Ambulatory Visit (INDEPENDENT_AMBULATORY_CARE_PROVIDER_SITE_OTHER): Payer: Medicare Other | Admitting: Family Medicine

## 2022-10-30 VITALS — BP 124/77 | HR 103 | Ht 64.0 in | Wt 194.0 lb

## 2022-10-30 DIAGNOSIS — U071 COVID-19: Secondary | ICD-10-CM

## 2022-10-30 DIAGNOSIS — I10 Essential (primary) hypertension: Secondary | ICD-10-CM | POA: Diagnosis not present

## 2022-10-30 DIAGNOSIS — R051 Acute cough: Secondary | ICD-10-CM

## 2022-10-30 DIAGNOSIS — R059 Cough, unspecified: Secondary | ICD-10-CM | POA: Diagnosis not present

## 2022-10-30 MED ORDER — ONDANSETRON HCL 4 MG PO TABS: 4.0000 mg | ORAL_TABLET | Freq: Three times a day (TID) | ORAL | 0 refills | Status: DC | PRN

## 2022-10-30 MED ORDER — ONDANSETRON HCL 4 MG/2ML IJ SOLN
4.0000 mg | Freq: Once | INTRAMUSCULAR | Status: AC
  Administered 2022-10-30: 4 mg via INTRAMUSCULAR

## 2022-10-30 MED ORDER — BENZONATATE 100 MG PO CAPS: 100.0000 mg | ORAL_CAPSULE | Freq: Two times a day (BID) | ORAL | 0 refills | Status: DC | PRN

## 2022-10-30 MED ORDER — NIRMATRELVIR/RITONAVIR (PAXLOVID) TABLET (RENAL DOSING): 2.0000 | ORAL_TABLET | Freq: Two times a day (BID) | ORAL | 0 refills | Status: AC

## 2022-10-30 NOTE — Patient Instructions (Addendum)
F/u as before, call if you need me sooner  Zofran 4 mg Im in office for nausea  CBC and diff, chem 7 and EGFR , Covid , RSV and flu swab  CXR at hospital today   Paxlovid is prescribed for your reported positive home covid test ose also decongestant perles for cough  Zofran tabs are prescribed for nausea,   Keep well hydrated, rest and self isolate as much as possible for the next 5 days  Thanks for choosing [facility], we consider it a privelige to serve you.

## 2022-10-30 NOTE — Progress Notes (Signed)
   Jillian [name]     MRN: [id number]      DOB: 1941/05/18   HPI Jillian [name] is heresick since yesterday , headache and post nasal drainage. 2 loose stool today and nausea , had emesis in the office Covid pos home swab ROS    Denies dysuria, frequency, hesitancy or incontinence. Denies joint pain, swelling and limitation in mobility. Denies headaches, seizures, numbness, or tingling. Denies depression, anxiety or insomnia. Denies skin break down or rash.   PE  BP 124/77 (BP Location: Right Arm, Patient Position: Sitting, Cuff Size: Large)   Pulse (!) 103   Ht '5\' 4"'$  (1.626 m)   Wt 194 lb 0.6 oz (88 kg)   SpO2 92%   BMI 33.31 kg/m   Patient alert and oriented and in no cardiopulmonary distress.  HEENT: No facial asymmetry, EOMI,     Neck supple .nasal congestion and drainage, no sinus tenderness  Chest: decreased air entry, no crackles or wheezes CVS: S1, S2 no murmurs, no S3.Regular rate.  ABD: Soft non tender.   Ext: No edema  MS: Adequate ROM spine, shoulders, hips and knees.  Skin: Intact, no ulcerations or rash noted.  Psych: Good eye contact, normal affect. Memory intact not anxious or depressed appearing.  CNS: CN 2-12 intact, power,  normal throughout.no focal deficits noted.   Assessment & Plan  Acute cough Positive home test for covid, prescribed renal dosed paxlovid and obtain CXR, r/o RSV and flu with triple test Baseline lab data obtained and reviewed , reassuring  Essential hypertension, benign Controlled, no change in medication

## 2022-10-31 LAB — BMP8+EGFR
BUN/Creatinine Ratio: 10 — ABNORMAL LOW (ref 12–28)
BUN: 12 mg/dL (ref 8–27)
CO2: 23 mmol/L (ref 20–29)
Calcium: 9.2 mg/dL (ref 8.7–10.3)
Chloride: 103 mmol/L (ref 96–106)
Creatinine, Ser: 1.24 mg/dL — ABNORMAL HIGH (ref 0.57–1.00)
Glucose: 153 mg/dL — ABNORMAL HIGH (ref 70–99)
Potassium: 3.7 mmol/L (ref 3.5–5.2)
Sodium: 141 mmol/L (ref 134–144)
eGFR: 44 mL/min/{1.73_m2} — ABNORMAL LOW (ref >59)

## 2022-10-31 LAB — CBC WITH DIFFERENTIAL/PLATELET
Basophils Absolute: 0 10*3/uL (ref 0.0–0.2)
Basos: 0 %
EOS (ABSOLUTE): 0 10*3/uL (ref 0.0–0.4)
Eos: 0 %
Hematocrit: 39.3 % (ref 34.0–46.6)
Hemoglobin: 12.9 g/dL (ref 11.1–15.9)
Immature Grans (Abs): 0.1 10*3/uL (ref 0.0–0.1)
Immature Granulocytes: 1 %
Lymphocytes Absolute: 2.5 10*3/uL (ref 0.7–3.1)
Lymphs: 29 %
MCH: 30.4 pg (ref 26.6–33.0)
MCHC: 32.8 g/dL (ref 31.5–35.7)
MCV: 93 fL (ref 79–97)
Monocytes Absolute: 1.1 10*3/uL — ABNORMAL HIGH (ref 0.1–0.9)
Monocytes: 13 %
Neutrophils Absolute: 4.8 10*3/uL (ref 1.4–7.0)
Neutrophils: 57 %
Platelets: 176 10*3/uL (ref 150–450)
RBC: 4.24 x10E6/uL (ref 3.77–5.28)
RDW: 13.6 % (ref 11.7–15.4)
WBC: 8.5 10*3/uL (ref 3.4–10.8)

## 2022-11-01 LAB — COVID-19, FLU A+B AND RSV
Influenza A, NAA: NOT DETECTED
Influenza B, NAA: NOT DETECTED
RSV, NAA: NOT DETECTED
SARS-CoV-2, NAA: DETECTED — AB

## 2022-11-03 ENCOUNTER — Encounter: Payer: Self-pay | Admitting: Family Medicine

## 2022-11-03 DIAGNOSIS — R051 Acute cough: Secondary | ICD-10-CM | POA: Insufficient documentation

## 2022-11-03 NOTE — Assessment & Plan Note (Signed)
Controlled, no change in medication  

## 2022-11-03 NOTE — Assessment & Plan Note (Signed)
Positive home test for covid, prescribed renal dosed paxlovid and obtain CXR, r/o RSV and flu with triple test Baseline lab data obtained and reviewed , reassuring

## 2022-11-05 ENCOUNTER — Telehealth: Payer: Self-pay | Admitting: Family Medicine

## 2022-11-05 ENCOUNTER — Other Ambulatory Visit: Payer: Self-pay

## 2022-11-05 DIAGNOSIS — E1121 Type 2 diabetes mellitus with diabetic nephropathy: Secondary | ICD-10-CM

## 2022-11-05 MED ORDER — LINAGLIPTIN 5 MG PO TABS: 5.0000 mg | ORAL_TABLET | Freq: Every day | ORAL | 3 refills | Status: DC

## 2022-11-05 NOTE — Telephone Encounter (Signed)
Patient needs refill on linagliptin (TRADJENTA) 5 MG TABS tablet   [facility] [address]

## 2022-11-05 NOTE — Telephone Encounter (Signed)
Refill sent.

## 2022-11-17 ENCOUNTER — Other Ambulatory Visit: Payer: Self-pay | Admitting: Family Medicine

## 2022-11-20 ENCOUNTER — Encounter: Payer: Self-pay | Admitting: Radiology

## 2022-11-25 ENCOUNTER — Ambulatory Visit: Payer: Medicare Other | Admitting: Nutrition

## 2022-12-10 ENCOUNTER — Other Ambulatory Visit: Payer: Self-pay | Admitting: Family Medicine

## 2022-12-15 ENCOUNTER — Ambulatory Visit (HOSPITAL_COMMUNITY): Payer: Medicare Other

## 2022-12-23 ENCOUNTER — Ambulatory Visit (INDEPENDENT_AMBULATORY_CARE_PROVIDER_SITE_OTHER): Payer: Medicare Other | Admitting: Internal Medicine

## 2022-12-23 ENCOUNTER — Encounter: Payer: Self-pay | Admitting: Internal Medicine

## 2022-12-23 VITALS — BP 135/67 | HR 88 | Ht 64.0 in | Wt 195.4 lb

## 2022-12-23 DIAGNOSIS — S46911A Strain of unspecified muscle, fascia and tendon at shoulder and upper arm level, right arm, initial encounter: Secondary | ICD-10-CM

## 2022-12-23 MED ORDER — CYCLOBENZAPRINE HCL 5 MG PO TABS: 5.0000 mg | ORAL_TABLET | Freq: Three times a day (TID) | ORAL | 1 refills | Status: AC | PRN

## 2022-12-23 MED ORDER — PREDNISONE 20 MG PO TABS: 20.0000 mg | ORAL_TABLET | Freq: Every day | ORAL | 0 refills | Status: AC

## 2022-12-23 NOTE — Patient Instructions (Signed)
It was a pleasure to see you today.  Thank you for giving [place] the opportunity to be involved in your care.  Below is a brief recap of your visit and next steps.  We will plan to see you again on 4/18.  Summary Prednisone x 5 days and flexeril prescribed for relief of shoulder pain Please see the attached exercises We will see you on 4/18 for routine follow up with Dr. Moshe Cipro

## 2022-12-23 NOTE — Progress Notes (Signed)
   Acute Office Visit  Subjective:     Patient ID: Jillian [name], female    DOB: [birth year], 82 y.o.   MRN: [id number]  Chief Complaint  Patient presents with   Shoulder Pain    Patient has pain in shoulder, neck and back, randomly started this morning   Jillian [name] presents today for an acute visit endorsing a 1 day history of right shoulder pain.  She reports onset of pain when she woke up this morning.  There is no inciting event or trauma.  She states that she was pain-free prior to going to bed.  She currently has difficulty with overhead motion secondary to pain.  Pain is located along the superior and anterior aspects of the right shoulder.  She has tried taking Tylenol for symptom relief, which has been mildly effective.  She has no additional concerns to discuss today.  Review of Systems  Musculoskeletal:  Positive for joint pain (Right shoulder) and neck pain.      Objective:    BP 135/67 (BP Location: Left Arm, Patient Position: Sitting, Cuff Size: Normal)   Pulse 88   Ht 5\' 4"  (1.626 m)   Wt 195 lb 6.4 oz (88.6 kg)   SpO2 96%   BMI 33.54 kg/m   Physical Exam Vitals reviewed.  Constitutional:      Appearance: Normal appearance.  Musculoskeletal:        General: No swelling, tenderness or deformity.     Comments: No obvious deformity on inspection of the right shoulder.  There is no tenderness palpation.  Active ROM is limited secondary to pain, particularly abduction beyond 90 degrees.  Negative empty can/Hawkins test.  Neurological:     Mental Status: She is alert.       Assessment & Plan:   Problem List Items Addressed This Visit       Right shoulder strain - Primary    Presenting today for an acute visit endorsing a 1 day history of right shoulder pain.  Abduction is limited secondary to pain.  Pain is located along the superior and anterior aspects of the shoulder.  There was no inciting event or trauma at the onset of pain.  Her exam is most consistent  with muscular strain within the right shoulder.  -Home PT exercises provided -Avoiding NSAIDs due to compromised renal function.  I have prescribed prednisone 20 mg x 5 days -Flexeril prescribed as well due to residual neck pain that has worsened with her shoulder pain -She will follow-up with Dr. Moshe Cipro on 4/18 for routine care       Meds ordered this encounter  Medications   predniSONE (DELTASONE) 20 MG tablet    Sig: Take 1 tablet (20 mg total) by mouth daily with breakfast for 5 days.    Dispense:  5 tablet    Refill:  0   cyclobenzaprine (FLEXERIL) 5 MG tablet    Sig: Take 1 tablet (5 mg total) by mouth 3 (three) times daily as needed for muscle spasms.    Dispense:  30 tablet    Refill:  1    Return if symptoms worsen or fail to improve.  Johnette Abraham, MD

## 2022-12-23 NOTE — Assessment & Plan Note (Signed)
Presenting today for an acute visit endorsing a 1 day history of right shoulder pain.  Abduction is limited secondary to pain.  Pain is located along the superior and anterior aspects of the shoulder.  There was no inciting event or trauma at the onset of pain.  Her exam is most consistent with muscular strain within the right shoulder.  -Home PT exercises provided -Avoiding NSAIDs due to compromised renal function.  I have prescribed prednisone 20 mg x 5 days -Flexeril prescribed as well due to residual neck pain that has worsened with her shoulder pain -She will follow-up with Dr. Moshe Cipro on 4/18 for routine care

## 2023-01-08 ENCOUNTER — Encounter: Payer: Self-pay | Admitting: Family Medicine

## 2023-01-08 ENCOUNTER — Ambulatory Visit (INDEPENDENT_AMBULATORY_CARE_PROVIDER_SITE_OTHER): Payer: Medicare Other | Admitting: Family Medicine

## 2023-01-08 VITALS — BP 118/78 | HR 87 | Resp 16 | Ht 64.0 in | Wt 196.8 lb

## 2023-01-08 DIAGNOSIS — E785 Hyperlipidemia, unspecified: Secondary | ICD-10-CM | POA: Diagnosis not present

## 2023-01-08 DIAGNOSIS — E1159 Type 2 diabetes mellitus with other circulatory complications: Secondary | ICD-10-CM

## 2023-01-08 DIAGNOSIS — E6609 Other obesity due to excess calories: Secondary | ICD-10-CM

## 2023-01-08 DIAGNOSIS — E559 Vitamin D deficiency, unspecified: Secondary | ICD-10-CM | POA: Diagnosis not present

## 2023-01-08 DIAGNOSIS — I10 Essential (primary) hypertension: Secondary | ICD-10-CM | POA: Diagnosis not present

## 2023-01-08 DIAGNOSIS — Z6832 Body mass index (BMI) 32.0-32.9, adult: Secondary | ICD-10-CM

## 2023-01-08 NOTE — Patient Instructions (Addendum)
Annual exam in November , call if you need me sooner  Fasting lipid, hepatic , hba1C ( [facility] , next week), TSH and vit D  It is important that you exercise regularly at least 30 minutes 5 times a week. If you develop chest pain, have severe difficulty breathing, or feel very tired, stop exercising immediately and seek medical attention  Think about what you will eat, plan ahead. Choose " clean, green, fresh or frozen" over canned, processed or packaged foods which are more sugary, salty and fatty. 70 to 75% of food eaten should be vegetables and fruit. Three meals at set times with snacks allowed between meals, but they must be fruit or vegetables. Aim to eat over a 12 hour period , example 7 am to 7 pm, and STOP after  your last meal of the day. Drink water,generally about 64 ounces per day, no other drink is as healthy. Fruit juice is best enjoyed in a healthy way, by EATING the fruit.   Thanks for choosing [facility], we consider it a privelige to serve you.

## 2023-01-12 ENCOUNTER — Encounter: Payer: Self-pay | Admitting: Family Medicine

## 2023-01-12 DIAGNOSIS — E559 Vitamin D deficiency, unspecified: Secondary | ICD-10-CM | POA: Insufficient documentation

## 2023-01-12 DIAGNOSIS — H524 Presbyopia: Secondary | ICD-10-CM | POA: Diagnosis not present

## 2023-01-12 DIAGNOSIS — Z961 Presence of intraocular lens: Secondary | ICD-10-CM | POA: Diagnosis not present

## 2023-01-12 LAB — HM DIABETES EYE EXAM

## 2023-01-12 NOTE — Assessment & Plan Note (Signed)
  Patient re-educated about  the importance of commitment to a  minimum of 150 minutes of exercise per week as able.  The importance of healthy food choices with portion control discussed, as well as eating regularly and within a 12 hour window most days. The need to choose "clean , green" food 50 to 75% of the time is discussed, as well as to make water the primary drink and set a goal of 64 ounces water daily.       01/08/2023   10:26 AM 12/23/2022    2:06 PM 10/30/2022    3:28 PM  Weight /BMI  Weight 196 lb 12.8 oz 195 lb 6.4 oz 194 lb 0.6 oz  Height  (1.626 m)  (1.626 m)  (1.626 m)  BMI 33.78 kg/m2 33.54 kg/m2 33.31 kg/m2    Unchnaged

## 2023-01-12 NOTE — Assessment & Plan Note (Signed)
Jillian [name] is reminded of the importance of commitment to daily physical activity for 30 minutes or more, as able and the need to limit carbohydrate intake to 30 to 60 grams per meal to help with blood sugar control.   The need to take medication as prescribed, test blood sugar as directed, and to call between visits if there is a concern that blood sugar is uncontrolled is also discussed.   Jillian [name] is reminded of the importance of daily foot exam, annual eye examination, and good blood sugar, blood pressure and cholesterol control.     Latest Ref Rng & Units 10/30/2022    4:29 PM 10/08/2022   11:50 AM 05/20/2022    9:18 AM 08/06/2021    9:46 AM 04/24/2021    8:41 AM  Diabetic Labs  HbA1c <5.7 % of total Hgb  6.9  6.6  6.8  6.9   Microalbumin Not Estab. ug/mL     10.1   Micro/Creat Ratio 0 - 29 mg/g creat     5   Chol 100 - 199 mg/dL   130   865   HDL >78 mg/dL   44   45   Calc LDL 0 - 99 mg/dL   90   62   Triglycerides 0 - 149 mg/dL   94   469   Creatinine 0.57 - 1.00 mg/dL 6.29  5.28   4.13  2.44       01/08/2023   10:26 AM 12/23/2022    2:06 PM 10/30/2022    3:28 PM 10/21/2022    8:09 AM 08/19/2022    9:55 AM 06/05/2022   10:04 AM 05/20/2022    8:28 AM  BP/Weight  Systolic BP 118 135 124  128 117 138  Diastolic BP 78 67 77  82 74 78  Wt. (Lbs) 196.8 195.4 194.04 187 189    BMI 33.78 kg/m2 33.54 kg/m2 33.31 kg/m2 32.1 kg/m2 32.44 kg/m2        Latest Ref Rng & Units 08/19/2022    9:40 AM 12/03/2021   12:00 AM  Foot/eye exam completion dates  Eye Exam No Retinopathy  No Retinopathy      Foot Form Completion  Done      This result is from an external source.      Updated lab needed at/ before next visit.

## 2023-01-12 NOTE — Assessment & Plan Note (Signed)
Controlled, no change in medication DASH diet and commitment to daily physical activity for a minimum of 30 minutes discussed and encouraged, as a part of hypertension management. The importance of attaining a healthy weight is also discussed.     01/08/2023   10:26 AM 12/23/2022    2:06 PM 10/30/2022    3:28 PM 10/21/2022    8:09 AM 08/19/2022    9:55 AM 06/05/2022   10:04 AM 05/20/2022    8:28 AM  BP/Weight  Systolic BP 118 135 124  128 117 138  Diastolic BP 78 67 77  82 74 78  Wt. (Lbs) 196.8 195.4 194.04 187 189    BMI 33.78 kg/m2 33.54 kg/m2 33.31 kg/m2 32.1 kg/m2 32.44 kg/m2

## 2023-01-12 NOTE — Assessment & Plan Note (Signed)
Updated lab needed at/ before next visit.   

## 2023-01-12 NOTE — Assessment & Plan Note (Signed)
Hyperlipidemia:Low fat diet discussed and encouraged.   Lipid Panel  Lab Results  Component Value Date   CHOL 152 05/20/2022   HDL 44 05/20/2022   LDLCALC 90 05/20/2022   TRIG 94 05/20/2022   CHOLHDL 3.5 05/20/2022     Updated lab needed at/ before next visit. Controlled when last checked

## 2023-01-12 NOTE — Progress Notes (Signed)
Jillian [name]     MRN: [id number]      DOB: Jan 01, 1941   HPI [name] is here for follow up and re-evaluation of chronic medical conditions, medication management and review of any available recent lab and radiology data.  Preventive health is updated, specifically  Cancer screening and Immunization.   Questions or concerns regarding consultations or procedures which the PT has had in the interim are  addressed. The PT denies any adverse reactions to current medications since the last visit.  There are no new concerns.  There are no specific complaints  Denies polyuria, polydipsia, blurred vision , or hypoglycemic episodes.   ROS Denies recent fever or chills. Denies sinus pressure, nasal congestion, ear pain or sore throat. Denies chest congestion, productive cough or wheezing. Denies chest pains, palpitations and leg swelling Denies abdominal pain, nausea, vomiting,diarrhea or constipation.   Denies dysuria, frequency, hesitancy or incontinence. Denies uncontrolled joint pain, swelling and limitation in mobility. Denies headaches, seizures, numbness, or tingling. Denies depression, anxiety or insomnia. Denies skin break down or rash.   PE  BP 118/78   Pulse 87   Resp 16   Ht  (1.626 m)   Wt 196 lb 12.8 oz (89.3 kg)   SpO2 95%   BMI 33.78 kg/m   Patient alert and oriented and in no cardiopulmonary distress.  HEENT: No facial asymmetry, EOMI,     Neck supple .  Chest: Clear to auscultation bilaterally.  CVS: S1, S2 no murmurs, no S3.Regular rate.  ABD: Soft non tender.   Ext: No edema  MS: Adequate ROM spine, shoulders, hips and knees.  Skin: Intact, no ulcerations or rash noted.  Psych: Good eye contact, normal affect. Memory intact not anxious or depressed appearing.  CNS: CN 2-12 intact, power,  normal throughout.no focal deficits noted.   Assessment & Plan  Essential hypertension, benign Controlled, no change in medication DASH diet and  commitment to daily physical activity for a minimum of 30 minutes discussed and encouraged, as a part of hypertension management. The importance of attaining a healthy weight is also discussed.     01/08/2023   10:26 AM 12/23/2022    2:06 PM 10/30/2022    3:28 PM 10/21/2022    8:09 AM 08/19/2022    9:55 AM 06/05/2022   10:04 AM 05/20/2022    8:28 AM  BP/Weight  Systolic BP 118 135 124  128 117 138  Diastolic BP 78 67 77  82 74 78  Wt. (Lbs) 196.8 195.4 194.04 187 189    BMI 33.78 kg/m2 33.54 kg/m2 33.31 kg/m2 32.1 kg/m2 32.44 kg/m2         Type 2 diabetes mellitus with vascular disease (HCC) Ms. [name] is reminded of the importance of commitment to daily physical activity for 30 minutes or more, as able and the need to limit carbohydrate intake to 30 to 60 grams per meal to help with blood sugar control.   The need to take medication as prescribed, test blood sugar as directed, and to call between visits if there is a concern that blood sugar is uncontrolled is also discussed.   Ms. [name] is reminded of the importance of daily foot exam, annual eye examination, and good blood sugar, blood pressure and cholesterol control.     Latest Ref Rng & Units 10/30/2022    4:29 PM 10/08/2022   11:50 AM 05/20/2022    9:18 AM 08/06/2021    9:46 AM 04/24/2021  8:41 AM  Diabetic Labs  HbA1c <5.7 % of total Hgb  6.9  6.6  6.8  6.9   Microalbumin Not Estab. ug/mL     10.1   Micro/Creat Ratio 0 - 29 mg/g creat     5   Chol 100 - 199 mg/dL   161   096   HDL >04 mg/dL   44   45   Calc LDL 0 - 99 mg/dL   90   62   Triglycerides 0 - 149 mg/dL   94   540   Creatinine 0.57 - 1.00 mg/dL 9.81  1.91   4.78  2.95       01/08/2023   10:26 AM 12/23/2022    2:06 PM 10/30/2022    3:28 PM 10/21/2022    8:09 AM 08/19/2022    9:55 AM 06/05/2022   10:04 AM 05/20/2022    8:28 AM  BP/Weight  Systolic BP 118 135 124  128 117 138  Diastolic BP 78 67 77  82 74 78  Wt. (Lbs) 196.8 195.4 194.04 187 189    BMI 33.78  kg/m2 33.54 kg/m2 33.31 kg/m2 32.1 kg/m2 32.44 kg/m2        Latest Ref Rng & Units 08/19/2022    9:40 AM 12/03/2021   12:00 AM  Foot/eye exam completion dates  Eye Exam No Retinopathy  No Retinopathy      Foot Form Completion  Done      This result is from an external source.      Updated lab needed at/ before next visit.   Hyperlipidemia with target LDL less than 100 Hyperlipidemia:Low fat diet discussed and encouraged.   Lipid Panel  Lab Results  Component Value Date   CHOL 152 05/20/2022   HDL 44 05/20/2022   LDLCALC 90 05/20/2022   TRIG 94 05/20/2022   CHOLHDL 3.5 05/20/2022     Updated lab needed at/ before next visit. Controlled when last checked  Vitamin D deficiency Updated lab needed at/ before next visit.   Obese  Patient re-educated about  the importance of commitment to a  minimum of 150 minutes of exercise per week as able.  The importance of healthy food choices with portion control discussed, as well as eating regularly and within a 12 hour window most days. The need to choose "clean , green" food 50 to 75% of the time is discussed, as well as to make water the primary drink and set a goal of 64 ounces water daily.       01/08/2023   10:26 AM 12/23/2022    2:06 PM 10/30/2022    3:28 PM  Weight /BMI  Weight 196 lb 12.8 oz 195 lb 6.4 oz 194 lb 0.6 oz  Height  (1.626 m)  (1.626 m)  (1.626 m)  BMI 33.78 kg/m2 33.54 kg/m2 33.31 kg/m2    Unchnaged

## 2023-01-13 ENCOUNTER — Other Ambulatory Visit: Payer: Self-pay | Admitting: Family Medicine

## 2023-01-13 DIAGNOSIS — E559 Vitamin D deficiency, unspecified: Secondary | ICD-10-CM

## 2023-01-13 DIAGNOSIS — R3914 Feeling of incomplete bladder emptying: Secondary | ICD-10-CM | POA: Diagnosis not present

## 2023-01-13 DIAGNOSIS — E1159 Type 2 diabetes mellitus with other circulatory complications: Secondary | ICD-10-CM | POA: Diagnosis not present

## 2023-01-13 DIAGNOSIS — E1122 Type 2 diabetes mellitus with diabetic chronic kidney disease: Secondary | ICD-10-CM | POA: Diagnosis not present

## 2023-01-13 DIAGNOSIS — N189 Chronic kidney disease, unspecified: Secondary | ICD-10-CM | POA: Diagnosis not present

## 2023-01-13 DIAGNOSIS — I129 Hypertensive chronic kidney disease with stage 1 through stage 4 chronic kidney disease, or unspecified chronic kidney disease: Secondary | ICD-10-CM | POA: Diagnosis not present

## 2023-01-14 ENCOUNTER — Other Ambulatory Visit: Payer: Self-pay

## 2023-01-14 DIAGNOSIS — E1159 Type 2 diabetes mellitus with other circulatory complications: Secondary | ICD-10-CM

## 2023-01-14 LAB — HEPATIC FUNCTION PANEL
AG Ratio: 1.4 (calc) (ref 1.0–2.5)
ALT: 9 U/L (ref 6–29)
AST: 13 U/L (ref 10–35)
Albumin: 3.9 g/dL (ref 3.6–5.1)
Alkaline phosphatase (APISO): 74 U/L (ref 37–153)
Bilirubin, Direct: 0.1 mg/dL (ref 0.0–0.2)
Globulin: 2.7 g/dL (calc) (ref 1.9–3.7)
Indirect Bilirubin: 0.3 mg/dL (calc) (ref 0.2–1.2)
Total Bilirubin: 0.4 mg/dL (ref 0.2–1.2)
Total Protein: 6.6 g/dL (ref 6.1–8.1)

## 2023-01-14 LAB — LIPID PANEL
Cholesterol: 145 mg/dL (ref <200)
HDL: 48 mg/dL — ABNORMAL LOW (ref >=50)
LDL Cholesterol (Calc): 80 mg/dL (calc)
Non-HDL Cholesterol (Calc): 97 mg/dL (calc) (ref <130)
Total CHOL/HDL Ratio: 3 (calc) (ref <5.0)
Triglycerides: 90 mg/dL (ref <150)

## 2023-01-14 LAB — HEMOGLOBIN A1C
Hgb A1c MFr Bld: 7.2 % of total Hgb — ABNORMAL HIGH (ref <5.7)
Mean Plasma Glucose: 160 mg/dL
eAG (mmol/L): 8.9 mmol/L

## 2023-01-14 LAB — VITAMIN D 25 HYDROXY (VIT D DEFICIENCY, FRACTURES): Vit D, 25-Hydroxy: 70 ng/mL (ref 30–100)

## 2023-01-14 LAB — TSH: TSH: 1.77 mIU/L (ref 0.40–4.50)

## 2023-01-19 DIAGNOSIS — I129 Hypertensive chronic kidney disease with stage 1 through stage 4 chronic kidney disease, or unspecified chronic kidney disease: Secondary | ICD-10-CM | POA: Diagnosis not present

## 2023-01-19 DIAGNOSIS — E1122 Type 2 diabetes mellitus with diabetic chronic kidney disease: Secondary | ICD-10-CM | POA: Diagnosis not present

## 2023-01-19 DIAGNOSIS — N189 Chronic kidney disease, unspecified: Secondary | ICD-10-CM | POA: Diagnosis not present

## 2023-02-03 ENCOUNTER — Encounter: Payer: Self-pay | Admitting: Internal Medicine

## 2023-02-03 ENCOUNTER — Ambulatory Visit (INDEPENDENT_AMBULATORY_CARE_PROVIDER_SITE_OTHER): Payer: Medicare Other | Admitting: Internal Medicine

## 2023-02-03 VITALS — BP 130/74 | HR 85 | Ht 64.0 in | Wt 195.6 lb

## 2023-02-03 DIAGNOSIS — M545 Low back pain, unspecified: Secondary | ICD-10-CM | POA: Insufficient documentation

## 2023-02-03 DIAGNOSIS — M5136 Other intervertebral disc degeneration, lumbar region: Secondary | ICD-10-CM

## 2023-02-03 DIAGNOSIS — M51369 Other intervertebral disc degeneration, lumbar region without mention of lumbar back pain or lower extremity pain: Secondary | ICD-10-CM

## 2023-02-03 MED ORDER — PREDNISONE 20 MG PO TABS
20.0000 mg | ORAL_TABLET | Freq: Every day | ORAL | 0 refills | Status: DC
Start: 2023-02-03 — End: 2023-04-28

## 2023-02-03 NOTE — Assessment & Plan Note (Addendum)
Started prednisone 20 mg x 5 days Unable to take oral NSAIDs due to CKD Flexeril as needed for muscle stiffness or spasms Simple back exercises material provided Avoid heavy lifting and frequent bending

## 2023-02-03 NOTE — Progress Notes (Signed)
Acute Office Visit  Subjective:    Patient ID: Jillian [name], female    DOB: 08-22-1941, 82 y.o.   MRN: [id number]  Chief Complaint  Patient presents with   Back Pain    Patient reports having lower back pain that started last night.    HPI Patient is in today for complaint of acute onset low back pain since last night.  She has low back pain, radiating to bilateral buttocks and back of the thigh area.  Her pain is constant, worse with bending and better with Flexeril.  She had been given Flexeril recently for shoulder pain/stiffness.  She denies any recent injury or fall.  Denies any recent heavy lifting.  Denies any numbness or tingling of the LE.  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   [facility] at [facility]   Hypertension 2005   Mild obesity    Postmenopausal    Rectal bleeding    Viral meningitis    Wears glasses     Past Surgical History:  Procedure Laterality Date   ABDOMINAL HYSTERECTOMY  1992   fibroids   APPENDECTOMY     BREAST EXCISIONAL BIOPSY Right    benign   BREAST LUMPECTOMY WITH RADIOACTIVE SEED LOCALIZATION Right 10/05/2014   Procedure: BREAST LUMPECTOMY WITH RADIOACTIVE SEED LOCALIZATION;  Surgeon: Harriette Bouillon, MD;  Location: [facility];  Service: General;  Laterality: Right;   cataract Bilateral    COLONOSCOPY  2013   Dr. Darrick Penna :  internal hemorrhoids, mild left-sided diverticulosis   COLONOSCOPY N/A 09/21/2019   Procedure: COLONOSCOPY;  Surgeon: West Bali, MD;  Location: [facility];  Service: Endoscopy;  Laterality: N/A;  12:00pm   CYST EXCISION Right    arm   ESOPHAGOGASTRODUODENOSCOPY  2013   Dr. Darrick Penna: gastritis likely NSAID-induced. Duodenal polyp. Duodenitis and gastritis in setting of  aspirin and Ibuprofen.    EYE SURGERY Right 06/07/2013   cataract   EYE SURGERY Left 09/14/2013   cataract   KNEE ARTHROSCOPY Right 02/26/2016   Procedure: ARTHROSCOPY RIGHT KNEE WITH MENSICAL DEBRIDEMENT;  Surgeon: Ollen Gross, MD;  Location: [facility];  Service: Orthopedics;  Laterality: Right;   left breast biopsy     LESION REMOVAL Right 03/30/2013   Procedure: EXCISION OF NEOPLASM ARM;  Surgeon: Dalia Heading, MD;  Location: [facility];  Service: General;  Laterality: Right;   TUBAL LIGATION      Family History  Problem Relation Age of Onset   Hypertension Father    Congestive Heart Failure Father    Liver cancer Mother    Hypertension Sister    Hypertension Brother    Diabetes Brother    Colon cancer Neg Hx    Colon polyps Neg Hx     Social History   Socioeconomic History   Marital status: Widowed    Spouse name: Not on file   Number of children: Not on file   Years of education: Not on file   Highest education level: Not on file  Occupational History   Not on file  Tobacco Use   Smoking status: Never   Smokeless tobacco: Never  Vaping Use   Vaping Use: Never used  Substance and Sexual  Activity   Alcohol use: No   Drug use: No   Sexual activity: Not Currently    Birth control/protection: Surgical  Other Topics Concern   Not on file  Social History Narrative   Not on file   Social Determinants of Health   Financial Resource Strain: Low Risk  (02/24/2022)   Overall Financial Resource Strain (CARDIA)    Difficulty of Paying Living Expenses: Not hard at all  Food Insecurity: No Food Insecurity (02/12/2021)   Hunger Vital Sign    Worried About Running Out of Food in the Last Year: Never true    Ran Out of Food in the Last Year: Never true  Transportation Needs: No Transportation Needs (02/24/2022)   PRAPARE - Administrator, Civil Service (Medical): No    Lack of Transportation (Non-Medical): No  Physical Activity: Insufficiently Active (02/24/2022)    Exercise Vital Sign    Days of Exercise per Week: 5 days    Minutes of Exercise per Session: 20 min  Stress: No Stress Concern Present (02/12/2021)   [employer] of Occupational Health - Occupational Stress Questionnaire    Feeling of Stress : Not at all  Social Connections: Moderately Isolated (02/24/2022)   Social Connection and Isolation Panel [NHANES]    Frequency of Communication with Friends and Family: More than three times a week    Frequency of Social Gatherings with Friends and Family: More than three times a week    Attends Religious Services: More than 4 times per year    Active Member of [employer] or Organizations: No    Attends Banker Meetings: Never    Marital Status: Widowed  Intimate Partner Violence: Not At Risk (02/12/2021)   Humiliation, Afraid, Rape, and Kick questionnaire    Fear of Current or Ex-Partner: No    Emotionally Abused: No    Physically Abused: No    Sexually Abused: No    Outpatient Medications Prior to Visit  Medication Sig Dispense Refill   acetaminophen (TYLENOL) 500 MG tablet Take 1,000 mg by mouth every 6 (six) hours as needed for mild pain.     aspirin EC 81 MG tablet Take 1 tablet (81 mg total) by mouth every evening. (Patient taking differently: Take 81 mg by mouth at bedtime.) 90 tablet 1   benazepril (LOTENSIN) 5 MG tablet TAKE 1 TABLET(5 MG) BY MOUTH DAILY 90 tablet 1   Blood Glucose Monitoring Suppl (TRUE METRIX METER) w/Device KIT USE AS DIRECTED 1 kit 0   calcium carbonate (OS-CAL - DOSED IN MG OF ELEMENTAL CALCIUM) 1250 (500 Ca) MG tablet Take 1 tablet by mouth.     cholecalciferol (VITAMIN D3) 25 MCG (1000 UNIT) tablet Take 2 tablets (2,000 Units total) by mouth daily. 180 tablet 3   cloNIDine (CATAPRES) 0.3 MG tablet TAKE 1 TABLET BY MOUTH IN THE  EVENING AT 8 PM 100 tablet 2   cyclobenzaprine (FLEXERIL) 5 MG tablet Take 1 tablet (5 mg total) by mouth 3 (three) times daily as needed for muscle spasms. 30 tablet 1    linagliptin (TRADJENTA) 5 MG TABS tablet Take 1 tablet (5 mg total) by mouth daily. 90 tablet 3   pravastatin (PRAVACHOL) 10 MG tablet TAKE 1 TABLET BY MOUTH DAILY 100 tablet 2   TRUE METRIX BLOOD GLUCOSE TEST test strip TEST ONE TIME DAILY 100 strip 2   No facility-administered medications prior to visit.    Allergies  Allergen Reactions   Ciprofloxacin Itching   Metronidazole  Itching   Codeine Nausea And Vomiting   Propoxyphene    Propoxyphene N-Acetaminophen Other (See Comments)    hallucinations    Review of Systems  Constitutional:  Negative for chills and fever.  HENT:  Negative for congestion, sinus pressure, sinus pain and sore throat.   Eyes:  Negative for pain and discharge.  Respiratory:  Negative for cough and shortness of breath.   Cardiovascular:  Negative for chest pain and palpitations.  Gastrointestinal:  Negative for abdominal pain, diarrhea, nausea and vomiting.  Endocrine: Negative for polydipsia and polyuria.  Genitourinary:  Negative for dysuria and hematuria.  Musculoskeletal:  Positive for back pain. Negative for neck pain and neck stiffness.  Skin:  Negative for rash.  Neurological:  Negative for dizziness and weakness.  Psychiatric/Behavioral:  Negative for agitation and behavioral problems.        Objective:    Physical Exam Vitals reviewed.  Constitutional:      General: She is not in acute distress.    Appearance: She is not diaphoretic.  Eyes:     General: No scleral icterus.    Extraocular Movements: Extraocular movements intact.  Cardiovascular:     Rate and Rhythm: Normal rate and regular rhythm.     Heart sounds: Normal heart sounds. No murmur heard. Pulmonary:     Breath sounds: Normal breath sounds. No wheezing or rales.  Musculoskeletal:     Cervical back: Neck supple. No tenderness.     Lumbar back: Tenderness present. Positive right straight leg raise test and positive left straight leg raise test.     Right lower leg: No  edema.     Left lower leg: No edema.  Skin:    General: Skin is warm.     Findings: No rash.  Neurological:     General: No focal deficit present.     Mental Status: She is alert and oriented to person, place, and time.  Psychiatric:        Mood and Affect: Mood normal.        Behavior: Behavior normal.     BP 130/74 (BP Location: Left Arm, Patient Position: Sitting, Cuff Size: Normal)   Pulse 85   Ht 5\' 4"  (1.626 m)   Wt 195 lb 9.6 oz (88.7 kg)   SpO2 94%   BMI 33.57 kg/m  Wt Readings from Last 3 Encounters:  02/03/23 195 lb 9.6 oz (88.7 kg)  01/08/23 196 lb 12.8 oz (89.3 kg)  12/23/22 195 lb 6.4 oz (88.6 kg)        Assessment & Plan:   Problem List Items Addressed This Visit       Musculoskeletal and Integument   DDD (degenerative disc disease), lumbar    Started prednisone 20 mg x 5 days Unable to take oral NSAIDs due to CKD Flexeril as needed for muscle stiffness or spasms Simple back exercises material provided Avoid heavy lifting and frequent bending      Relevant Medications   predniSONE (DELTASONE) 20 MG tablet     Other   Acute midline low back pain without sciatica - Primary    Acute low back pain, could be due to sleep posture or certain movement Likely has underlying DDD of lumbar spine as well Started prednisone 20 mg x 5 days Unable to take oral NSAIDs due to CKD Flexeril as needed for muscle stiffness or spasms      Relevant Medications   predniSONE (DELTASONE) 20 MG tablet     Meds ordered  this encounter  Medications   predniSONE (DELTASONE) 20 MG tablet    Sig: Take 1 tablet (20 mg total) by mouth daily with breakfast.    Dispense:  5 tablet    Refill:  0     Azarel Banner Concha Se, MD

## 2023-02-03 NOTE — Assessment & Plan Note (Signed)
Acute low back pain, could be due to sleep posture or certain movement Likely has underlying DDD of lumbar spine as well Started prednisone 20 mg x 5 days Unable to take oral NSAIDs due to CKD Flexeril as needed for muscle stiffness or spasms

## 2023-02-03 NOTE — Patient Instructions (Addendum)
Please take Prednisone as prescribed.  Take Flexeril as needed for back muscle spasms.

## 2023-02-17 ENCOUNTER — Other Ambulatory Visit: Payer: Self-pay | Admitting: Family Medicine

## 2023-02-17 ENCOUNTER — Telehealth: Payer: Self-pay | Admitting: Family Medicine

## 2023-02-17 NOTE — Telephone Encounter (Signed)
Refills sent

## 2023-02-17 NOTE — Telephone Encounter (Signed)
Pt called and requested a refill.  benazepril (LOTENSIN) 5 MG tablet [[id number]]

## 2023-02-18 ENCOUNTER — Inpatient Hospital Stay: Payer: Medicare Other | Attending: Hematology

## 2023-02-18 ENCOUNTER — Encounter (HOSPITAL_COMMUNITY): Payer: Self-pay

## 2023-02-18 ENCOUNTER — Ambulatory Visit (HOSPITAL_COMMUNITY)
Admission: RE | Admit: 2023-02-18 | Discharge: 2023-02-18 | Disposition: A | Discharge status: Home / Self Care | Payer: Medicare Other | Source: Ambulatory Visit | Attending: Hematology | Admitting: Hematology

## 2023-02-18 DIAGNOSIS — D241 Benign neoplasm of right breast: Secondary | ICD-10-CM

## 2023-02-18 DIAGNOSIS — E538 Deficiency of other specified B group vitamins: Secondary | ICD-10-CM

## 2023-02-18 DIAGNOSIS — Z1231 Encounter for screening mammogram for malignant neoplasm of breast: Secondary | ICD-10-CM | POA: Diagnosis not present

## 2023-02-18 DIAGNOSIS — E559 Vitamin D deficiency, unspecified: Secondary | ICD-10-CM | POA: Diagnosis not present

## 2023-02-18 LAB — CBC WITH DIFFERENTIAL/PLATELET
Abs Immature Granulocytes: 0 10*3/uL (ref 0.00–0.07)
Basophils Absolute: 0 10*3/uL (ref 0.0–0.1)
Basophils Relative: 0 %
Eosinophils Absolute: 0 10*3/uL (ref 0.0–0.5)
Eosinophils Relative: 0 %
HCT: 40.8 % (ref 36.0–46.0)
Hemoglobin: 12.8 g/dL (ref 12.0–15.0)
Lymphocytes Relative: 43 %
Lymphs Abs: 3.2 10*3/uL (ref 0.7–4.0)
MCH: 30.1 pg (ref 26.0–34.0)
MCHC: 31.4 g/dL (ref 30.0–36.0)
MCV: 96 fL (ref 80.0–100.0)
Monocytes Absolute: 0.2 10*3/uL (ref 0.1–1.0)
Monocytes Relative: 2 %
Neutro Abs: 4.1 10*3/uL (ref 1.7–7.7)
Neutrophils Relative %: 55 %
Platelets: 171 10*3/uL (ref 150–400)
RBC: 4.25 MIL/uL (ref 3.87–5.11)
RDW: 14.6 % (ref 11.5–15.5)
WBC: 7.5 10*3/uL (ref 4.0–10.5)
nRBC: 0 % (ref 0.0–0.2)

## 2023-02-18 LAB — COMPREHENSIVE METABOLIC PANEL
ALT: 12 U/L (ref 0–44)
AST: 14 U/L — ABNORMAL LOW (ref 15–41)
Albumin: 3.6 g/dL (ref 3.5–5.0)
Alkaline Phosphatase: 66 U/L (ref 38–126)
Anion gap: 10 (ref 5–15)
BUN: 12 mg/dL (ref 8–23)
CO2: 22 mmol/L (ref 22–32)
Calcium: 9.2 mg/dL (ref 8.9–10.3)
Chloride: 105 mmol/L (ref 98–111)
Creatinine, Ser: 1.38 mg/dL — ABNORMAL HIGH (ref 0.44–1.00)
GFR, Estimated: 39 mL/min — ABNORMAL LOW (ref >60)
Glucose, Bld: 151 mg/dL — ABNORMAL HIGH (ref 70–99)
Potassium: 4.2 mmol/L (ref 3.5–5.1)
Sodium: 137 mmol/L (ref 135–145)
Total Bilirubin: 0.6 mg/dL (ref 0.3–1.2)
Total Protein: 7.2 g/dL (ref 6.5–8.1)

## 2023-02-18 LAB — VITAMIN D 25 HYDROXY (VIT D DEFICIENCY, FRACTURES): Vit D, 25-Hydroxy: 47.91 ng/mL (ref 30–100)

## 2023-02-18 LAB — IRON AND TIBC
Iron: 114 ug/dL (ref 28–170)
Saturation Ratios: 40 % — ABNORMAL HIGH (ref 10.4–31.8)
TIBC: 286 ug/dL (ref 250–450)
UIBC: 172 ug/dL

## 2023-02-18 LAB — VITAMIN B12: Vitamin B-12: 279 pg/mL (ref 180–914)

## 2023-02-18 LAB — FERRITIN: Ferritin: 62 ng/mL (ref 11–307)

## 2023-02-19 ENCOUNTER — Ambulatory Visit (HOSPITAL_COMMUNITY): Payer: Medicare Other | Admitting: Physician Assistant

## 2023-02-24 NOTE — Progress Notes (Unsigned)
[facility] [address], [place] [address]   CLINIC:  Medical Oncology/Hematology  PCP:  Kerri Perches, MD 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, a 82 y.o. 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 Rajvi was last seen on 02/01/2022 by Dr. Ellin Saba.   At today's visit, she  reports feeling well.  She denies any recent hospitalizations, surgeries, or changes in her  baseline health status.  She denies any symptoms of recurrence such as new lumps, bone pain, chest pain, dyspnea, or abdominal pain.  She has no new headaches, seizures, or focal neurologic deficits.  No B symptoms such as fever, chills, night sweats, unintentional weight loss.  She reports 100% energy and 100% appetite.  She is maintaining stable weight at this time.   ASSESSMENT & PLAN:  1.  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 Luisa Hart on 10/05/2014,.  (She had been underoing diagnositic mammography for ongoing follow-up of a 1.2 x 1.4 x 0.9 cm hypoechoic lobular mass in the R breast at the 2:30 position 6 cm from the nipple.) - She was treated with tamoxifen x 5 years for chemoprevention (2016 to 2021)  - Her last mammogram was 02/18/2023 which was B RADS category 1 negative.   - Physical examination today shows right breast lower inner quadrant scar with no palpable masses, no palpable adenopathy. - Labs done on 02/18/2023 show normal CBC.  CMP with normal LFTs, and baseline CKD stage IIIb (creatinine 1.38/GFR 39). - PLAN: Continue annual follow-up with labs, mammogram, physical exam.    2.  Vitamin B12 deficiency - She previously took vitamin B12- stopped about a year ago - Most  recent labs (02/18/2023) show marginal vitamin B12 at 279  - PLAN: Restart vitamin B12 500 mcg every other day  3.  Vitamin D deficiency - She is taking vitamin D 2000 units daily. - Most recent labs (02/18/2023) show normal vitamin D47.91 - PLAN: Continue vitamin D.     4.  History of iron deficiency - Most recent labs show normal hemoglobin and iron levels  PLAN SUMMARY: >> Labs in 51 weeks = CBC/D, CMP, vitamin D, B12, MMA, ferritin, iron/TIBC >> Mammogram and 51 weeks >> OFFICE visit in 1 year    REVIEW OF SYSTEMS: No complaints at today's visit  Review of Systems  Constitutional:  Negative for appetite change, chills, diaphoresis, fatigue, fever and unexpected weight change.  HENT:   Negative for lump/mass and nosebleeds.   Eyes:  Negative for eye problems.  Respiratory:  Negative for cough, hemoptysis and shortness of breath.   Cardiovascular:  Negative for chest pain, leg swelling and palpitations.  Gastrointestinal:  Negative for abdominal pain, blood in stool, constipation, diarrhea, nausea and vomiting.  Genitourinary:  Negative for hematuria.   Skin: Negative.   Neurological:  Negative for dizziness, headaches and light-headedness.  Hematological:  Does not bruise/bleed easily.    PHYSICAL EXAM:   Performance status (ECOG): 0 - Asymptomatic  There were no vitals filed for this visit. Wt Readings from Last 3 Encounters:  02/03/23 195 lb 9.6 oz (88.7 kg)  01/08/23 196 lb 12.8 oz (89.3 kg)  12/23/22 195 lb 6.4 oz (88.6 kg)   Physical Exam Constitutional:  Appearance: Normal appearance. She is obese.  Cardiovascular:     Heart sounds: Normal heart sounds.  Pulmonary:     Breath sounds: Normal breath sounds.  Chest:       Comments: No discrete nodules or masses palpated within either breast. Lymphadenopathy:     Upper Body:     Right upper body: No supraclavicular, axillary or pectoral adenopathy.     Left upper body: No supraclavicular, axillary or  pectoral adenopathy.  Neurological:     General: No focal deficit present.     Mental Status: Mental status is at baseline.  Psychiatric:        Behavior: Behavior normal. Behavior is cooperative.      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   [facility] at [facility]   Hypertension 2005   Mild obesity    Postmenopausal    Rectal bleeding    Viral meningitis    Wears glasses    Past Surgical History:  Procedure Laterality Date   ABDOMINAL HYSTERECTOMY  1992   fibroids   APPENDECTOMY     BREAST EXCISIONAL BIOPSY Right    benign   BREAST LUMPECTOMY WITH RADIOACTIVE SEED LOCALIZATION Right 10/05/2014   Procedure: BREAST LUMPECTOMY WITH RADIOACTIVE SEED LOCALIZATION;  Surgeon: Harriette Bouillon, MD;  Location: [facility];  Service: General;  Laterality: Right;   cataract Bilateral    COLONOSCOPY  2013   Dr. Darrick Penna :  internal hemorrhoids, mild left-sided diverticulosis   COLONOSCOPY N/A 09/21/2019   Procedure: COLONOSCOPY;  Surgeon: West Bali, MD;  Location: [facility];  Service: Endoscopy;  Laterality: N/A;  12:00pm   CYST EXCISION Right    arm   ESOPHAGOGASTRODUODENOSCOPY  2013   Dr. Darrick Penna: gastritis likely NSAID-induced. Duodenal polyp. Duodenitis and gastritis in setting of aspirin and Ibuprofen.    EYE SURGERY Right 06/07/2013   cataract   EYE SURGERY Left 09/14/2013   cataract   KNEE ARTHROSCOPY Right 02/26/2016   Procedure: ARTHROSCOPY RIGHT KNEE WITH MENSICAL DEBRIDEMENT;  Surgeon: Ollen Gross, MD;  Location: [facility];  Service: Orthopedics;  Laterality: Right;   left breast biopsy     LESION REMOVAL Right 03/30/2013   Procedure: EXCISION OF NEOPLASM ARM;  Surgeon: Dalia Heading, MD;  Location: [facility];  Service: General;  Laterality: Right;   TUBAL LIGATION      SOCIAL HISTORY:  Social History   Socioeconomic History   Marital status: Widowed    Spouse name: Not on file   Number of children: Not on file   Years of education: Not on file   Highest education level: Not on file  Occupational History   Not on file  Tobacco Use   Smoking status: Never   Smokeless tobacco: Never  Vaping Use   Vaping Use: Never used  Substance and Sexual Activity   Alcohol use: No   Drug use: No   Sexual activity: Not Currently    Birth control/protection: Surgical  Other Topics Concern   Not on file  Social History Narrative   Not on file   Social Determinants of Health   Financial Resource Strain: Low Risk  (02/24/2022)   Overall Financial Resource  Strain (CARDIA)    Difficulty of Paying Living Expenses: Not hard at all  Food Insecurity: No Food Insecurity (02/12/2021)   Hunger Vital Sign    Worried About Running Out of Food in the Last Year: Never true    Ran Out of Food in the Last Year: Never true  Transportation Needs: No Transportation Needs (02/24/2022)   PRAPARE - Administrator, Civil Service (Medical): No    Lack of Transportation (Non-Medical): No  Physical Activity: Insufficiently Active (02/24/2022)   Exercise Vital Sign    Days of Exercise per Week: 5 days    Minutes of Exercise per Session: 20 min  Stress: No Stress Concern Present (02/12/2021)   [employer] of Occupational Health - Occupational Stress Questionnaire    Feeling of Stress : Not at all  Social Connections: Moderately Isolated (02/24/2022)   Social Connection and Isolation Panel [NHANES]    Frequency of Communication with Friends and Family: More than three times a week    Frequency of Social Gatherings with Friends and Family: More than three times a week    Attends Religious Services: More than 4 times per year    Active Member of [employer] or Organizations: No    Attends Banker  Meetings: Never    Marital Status: Widowed  Intimate Partner Violence: Not At Risk (02/12/2021)   Humiliation, Afraid, Rape, and Kick questionnaire    Fear of Current or Ex-Partner: No    Emotionally Abused: No    Physically Abused: No    Sexually Abused: No    FAMILY HISTORY:  Family History  Problem Relation Age of Onset   Hypertension Father    Congestive Heart Failure Father    Liver cancer Mother    Hypertension Sister    Hypertension Brother    Diabetes Brother    Colon cancer Neg Hx    Colon polyps Neg Hx     CURRENT MEDICATIONS:  Current Outpatient Medications  Medication Sig Dispense Refill   acetaminophen (TYLENOL) 500 MG tablet Take 1,000 mg by mouth every 6 (six) hours as needed for mild pain.     aspirin EC 81 MG tablet Take 1 tablet (81 mg total) by mouth every evening. (Patient taking differently: Take 81 mg by mouth at bedtime.) 90 tablet 1   benazepril (LOTENSIN) 5 MG tablet TAKE 1 TABLET(5 MG) BY MOUTH DAILY 90 tablet 1   Blood Glucose Monitoring Suppl (TRUE METRIX METER) w/Device KIT USE AS DIRECTED 1 kit 0   calcium carbonate (OS-CAL - DOSED IN MG OF ELEMENTAL CALCIUM) 1250 (500 Ca) MG tablet Take 1 tablet by mouth.     cholecalciferol (VITAMIN D3) 25 MCG (1000 UNIT) tablet Take 2 tablets (2,000 Units total) by mouth daily. 180 tablet 3   cloNIDine (CATAPRES) 0.3 MG tablet TAKE 1 TABLET BY MOUTH IN THE  EVENING AT 8 PM 100 tablet 2   cyclobenzaprine (FLEXERIL) 5 MG tablet Take 1 tablet (5 mg total) by mouth 3 (three) times daily as needed for muscle spasms. 30 tablet 1   linagliptin (TRADJENTA) 5 MG TABS tablet Take 1 tablet (5 mg total) by mouth daily. 90 tablet 3   pravastatin (PRAVACHOL) 10 MG tablet TAKE 1 TABLET BY MOUTH DAILY 100 tablet 2   predniSONE (DELTASONE) 20 MG tablet Take 1 tablet (20 mg total) by mouth daily with breakfast. 5 tablet 0   TRUE METRIX BLOOD GLUCOSE TEST test strip TEST ONE TIME DAILY 100 strip 2  No current facility-administered  medications for this visit.    ALLERGIES:  Allergies  Allergen Reactions   Ciprofloxacin Itching   Metronidazole Itching   Codeine Nausea And Vomiting   Propoxyphene    Propoxyphene N-Acetaminophen Other (See Comments)    hallucinations    LABORATORY DATA:  I have reviewed the labs as listed.     Latest Ref Rng & Units 02/18/2023    9:55 AM 10/30/2022    4:29 PM 02/10/2022    9:51 AM  CBC  WBC 4.0 - 10.5 K/uL 7.5  8.5  4.8   Hemoglobin 12.0 - 15.0 g/dL 16.1  09.6  04.5   Hematocrit 36.0 - 46.0 % 40.8  39.3  39.8   Platelets 150 - 400 K/uL 171  176  155       Latest Ref Rng & Units 02/18/2023    9:55 AM 01/13/2023    8:59 AM 10/30/2022    4:29 PM  CMP  Glucose 70 - 99 mg/dL 409   811   BUN 8 - 23 mg/dL 12   12   Creatinine 9.14 - 1.00 mg/dL 7.82   9.56   Sodium 213 - 145 mmol/L 137   141   Potassium 3.5 - 5.1 mmol/L 4.2   3.7   Chloride 98 - 111 mmol/L 105   103   CO2 22 - 32 mmol/L 22   23   Calcium 8.9 - 10.3 mg/dL 9.2   9.2   Total Protein 6.5 - 8.1 g/dL 7.2  6.6    Total Bilirubin 0.3 - 1.2 mg/dL 0.6  0.4    Alkaline Phos 38 - 126 U/L 66     AST 15 - 41 U/L 14  13    ALT 0 - 44 U/L 12  9      DIAGNOSTIC IMAGING:  I have independently reviewed the scans and discussed with the patient. MM 3D SCREEN BREAST BILATERAL  Result Date: 02/19/2023 CLINICAL DATA:  Screening. EXAM: DIGITAL SCREENING BILATERAL MAMMOGRAM WITH TOMOSYNTHESIS AND CAD TECHNIQUE: Bilateral screening digital craniocaudal and mediolateral oblique mammograms were obtained. Bilateral screening digital breast tomosynthesis was performed. The images were evaluated with computer-aided detection. COMPARISON:  Previous exam(s). ACR Breast Density Category a: The breasts are almost entirely fatty. FINDINGS: There are no findings suspicious for malignancy. IMPRESSION: No mammographic evidence of malignancy. A result letter of this screening mammogram will be mailed directly to the patient. RECOMMENDATION: Screening  mammogram in one year. (Code:[id number]-B-01Y) BI-RADS CATEGORY  1: Negative. Electronically Signed   By: Frederico Hamman M.D.   On: 02/19/2023 12:47     WRAP UP:  All questions were answered. The patient knows to call the clinic with any problems, questions or concerns.  Medical decision making: Moderate  Time spent on visit: I spent 20 minutes counseling the patient face to face. The total time spent in the appointment was 30 minutes and more than 50% was on counseling.  Carnella Guadalajara, PA-C  02/25/2023 10:17 AM

## 2023-02-25 ENCOUNTER — Ambulatory Visit: Payer: Medicare Other | Admitting: Physician Assistant

## 2023-02-25 ENCOUNTER — Encounter: Payer: Self-pay | Admitting: Physician Assistant

## 2023-02-25 ENCOUNTER — Inpatient Hospital Stay: Payer: Medicare Other | Attending: Physician Assistant | Admitting: Physician Assistant

## 2023-02-25 VITALS — BP 131/50 | HR 96 | Temp 97.8°F | Resp 18 | Wt 199.0 lb

## 2023-02-25 DIAGNOSIS — Z8639 Personal history of other endocrine, nutritional and metabolic disease: Secondary | ICD-10-CM | POA: Diagnosis not present

## 2023-02-25 DIAGNOSIS — E538 Deficiency of other specified B group vitamins: Secondary | ICD-10-CM | POA: Insufficient documentation

## 2023-02-25 DIAGNOSIS — E559 Vitamin D deficiency, unspecified: Secondary | ICD-10-CM | POA: Diagnosis not present

## 2023-02-25 DIAGNOSIS — N6091 Unspecified benign mammary dysplasia of right breast: Secondary | ICD-10-CM | POA: Insufficient documentation

## 2023-02-25 DIAGNOSIS — N1832 Chronic kidney disease, stage 3b: Secondary | ICD-10-CM | POA: Insufficient documentation

## 2023-02-25 DIAGNOSIS — D241 Benign neoplasm of right breast: Secondary | ICD-10-CM | POA: Insufficient documentation

## 2023-02-25 MED ORDER — CYANOCOBALAMIN 500 MCG PO TABS
500.0000 ug | ORAL_TABLET | ORAL | 1 refills | Status: DC
Start: 2023-02-25 — End: 2023-02-26

## 2023-02-25 NOTE — Patient Instructions (Signed)
[facility] at [facility] **VISIT SUMMARY & IMPORTANT INSTRUCTIONS **   You were seen today by Rojelio Brenner PA-C for your follow-up visit.    HISTORY OF RIGHT BREAST PAPILLOMA & ATYPICAL DUCTAL HYPERPLASIA As we discussed, you were diagnosed with "precancerous" findings in 2016, and have completed treatment with tamoxifen. Your most recent labs, physical exam, and mammogram did not show any sign of breast cancer. We will see you in 1 year for labs, mammogram, and office visit.  VITAMIN DEFICIENCIES Continue taking vitamin D supplement. Restart vitamin B12 (500 mcg) every other day.  ** Thank you for trusting me with your healthcare!  I strive to provide all of my patients with quality care at each visit.  If you receive a survey for this visit, I would be so grateful to you for taking the time to provide feedback.  Thank you in advance!  ~ Nazarene Bunning                   Dr. Doreatha Massed   &   Rojelio Brenner, PA-C   - - - - - - - - - - - - - - - - - -    Thank you for choosing [facility] at [facility] to provide your oncology and hematology care.  To afford each patient quality time with our provider, please arrive at least 15 minutes before your scheduled appointment time.   If you have a lab appointment with the Cancer Center please come in thru the Main Entrance and check in at the main information desk.  You need to re-schedule your appointment should you arrive 10 or more minutes late.  We strive to give you quality time with our providers, and arriving late affects you and other patients whose appointments are after yours.  Also, if you no show three or more times for appointments you may be dismissed from the clinic at the providers discretion.     Again, thank you for choosing [facility].  Our hope is that these requests will decrease the amount of time that you wait before being seen by our physicians.        _____________________________________________________________  Should you have questions after your visit to [facility], please contact our office at [phone] and follow the prompts.  Our office hours are 8:00 a.m. and 4:30 p.m. Monday - Friday.  Please note that voicemails left after 4:00 p.m. may not be returned until the following business day.  We are closed weekends and major holidays.  You do have access to a nurse 24-7, just call the main number to the clinic [phone] and do not press any options, hold on the line and a nurse will answer the phone.    For prescription refill requests, have your pharmacy contact our office and allow 72 hours.

## 2023-02-26 ENCOUNTER — Other Ambulatory Visit: Payer: Self-pay | Admitting: Physician Assistant

## 2023-02-26 DIAGNOSIS — E538 Deficiency of other specified B group vitamins: Secondary | ICD-10-CM

## 2023-02-26 MED ORDER — CYANOCOBALAMIN 500 MCG PO TABS
500.0000 ug | ORAL_TABLET | ORAL | 1 refills | Status: AC
Start: 2023-02-26 — End: ?

## 2023-03-09 ENCOUNTER — Ambulatory Visit (INDEPENDENT_AMBULATORY_CARE_PROVIDER_SITE_OTHER): Payer: Medicare Other

## 2023-03-09 VITALS — BP 134/78 | HR 83 | Ht 64.0 in | Wt 199.1 lb

## 2023-03-09 DIAGNOSIS — Z0000 Encounter for general adult medical examination without abnormal findings: Secondary | ICD-10-CM | POA: Diagnosis not present

## 2023-03-09 NOTE — Progress Notes (Signed)
Subjective:   Jillian [name] is a 82 y.o. female who presents for Medicare Annual (Subsequent) preventive examination.  Review of Systems     Jillian [name] , Thank you for taking time to come for your Medicare Wellness Visit. I appreciate your ongoing commitment to your health goals. Please review the following plan we discussed and let me know if I can assist you in the future.   These are the goals we discussed:  Goals      DIET - INCREASE WATER INTAKE     Exercise 3x per week (30 min per time)     Recommend starting a routine exercise program at least 3 days a week for 30-45 minutes at a time as tolerated.       LIFESTYLE - DECREASE FALLS RISK        This is a list of the screening recommended for you and due dates:  Health Maintenance  Topic Date Due   DTaP/Tdap/Td vaccine (3 - Tdap) 05/01/2014   COVID-19 Vaccine (4 - 2023-24 season) 09/01/2022   Eye exam for diabetics  12/04/2022   Yearly kidney health urinalysis for diabetes  05/08/2023*   Flu Shot  04/23/2023   Hemoglobin A1C  07/15/2023   Complete foot exam   08/20/2023   Yearly kidney function blood test for diabetes  02/18/2024   Medicare Annual Wellness Visit  03/08/2024   Pneumonia Vaccine  Completed   DEXA scan (bone density measurement)  Completed   Zoster (Shingles) Vaccine  Completed   HPV Vaccine  Aged Out   Hepatitis C Screening  Discontinued  *Topic was postponed. The date shown is not the original due date.          Objective:    There were no vitals filed for this visit. There is no height or weight on file to calculate BMI.     02/25/2023    9:49 AM 02/24/2022   10:12 AM 02/19/2022   11:48 AM 06/12/2021   10:42 AM 02/13/2021   10:47 AM 02/12/2021    9:19 AM 05/08/2020    9:19 AM  Advanced Directives  Does Patient Have a Medical Advance Directive? No Yes Yes Yes Yes Yes Yes  Type of Surveyor, minerals;Living will  Healthcare Power of [place];Living will  Healthcare Power of [place];Living will Healthcare Power of [place];Living will  Does patient want to make changes to medical advance directive?  No - Patient declined  No - Patient declined No - Patient declined  No - Patient declined  Copy of Healthcare Power of Attorney in Chart?  No - copy requested No - copy requested  No - copy requested No - copy requested No - copy requested  Would patient like information on creating a medical advance directive? No - Patient declined    No - Patient declined  No - Patient declined    Current Medications (verified) Outpatient Encounter Medications as of 03/09/2023  Medication Sig   acetaminophen (TYLENOL) 500 MG tablet Take 1,000 mg by mouth every 6 (six) hours as needed for mild pain.   aspirin EC 81 MG tablet Take 1 tablet (81 mg total) by mouth every evening. (Patient taking differently: Take 81 mg by mouth at bedtime.)   benazepril (LOTENSIN) 5 MG tablet TAKE 1 TABLET(5 MG) BY MOUTH DAILY   Blood Glucose Monitoring Suppl (TRUE METRIX METER) w/Device KIT USE AS DIRECTED   calcium carbonate (OS-CAL - DOSED IN MG OF ELEMENTAL CALCIUM)  1250 (500 Ca) MG tablet Take 1 tablet by mouth.   cholecalciferol (VITAMIN D3) 25 MCG (1000 UNIT) tablet Take 2 tablets (2,000 Units total) by mouth daily.   cloNIDine (CATAPRES) 0.3 MG tablet TAKE 1 TABLET BY MOUTH IN THE  EVENING AT 8 PM   cyanocobalamin (V-R VITAMIN B-12) 500 MCG tablet Take 1 tablet (500 mcg total) by mouth every other day.   cyclobenzaprine (FLEXERIL) 5 MG tablet Take 1 tablet (5 mg total) by mouth 3 (three) times daily as needed for muscle spasms.   linagliptin (TRADJENTA) 5 MG TABS tablet Take 1 tablet (5 mg total) by mouth daily.   pravastatin (PRAVACHOL) 10 MG tablet TAKE 1 TABLET BY MOUTH DAILY   predniSONE (DELTASONE) 20 MG tablet Take 1 tablet (20 mg total) by mouth daily with breakfast.   TRUE METRIX BLOOD GLUCOSE TEST test strip TEST ONE TIME DAILY   No facility-administered encounter  medications on file as of 03/09/2023.    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   [facility]ospital[facility]   Hypertension 2005   Mild obesity    Postmenopausal    Rectal bleeding    Viral meningitis    Wears glasses    Past Surgical History:  Procedure Laterality Date   ABDOMINAL HYSTERECTOMY  1992   fibroids   APPENDECTOMY     BREAST EXCISIONAL BIOPSY Right    benign   BREAST LUMPECTOMY WITH RADIOACTIVE SEED LOCALIZATION Right 10/05/2014   Procedure: BREAST LUMPECTOMY WITH RADIOACTIVE SEED LOCALIZATION;  Surgeon: Harriette Bouillon, MD;  Location: [facility];  Service: General;  Laterality: Right;   cataract Bilateral    COLONOSCOPY  2013   Dr. Darrick Penna :  internal hemorrhoids, mild left-sided diverticulosis   COLONOSCOPY N/A 09/21/2019   Procedure: COLONOSCOPY;  Surgeon: West Bali, MD;  Location: [facility];  Service: Endoscopy;  Laterality: N/A;  12:00pm   CYST EXCISION Right    arm   ESOPHAGOGASTRODUODENOSCOPY  2013   Dr. Darrick Penna: gastritis likely NSAID-induced. Duodenal polyp. Duodenitis and gastritis in setting of aspirin and Ibuprofen.    EYE SURGERY Right 06/07/2013   cataract   EYE SURGERY Left 09/14/2013   cataract   KNEE ARTHROSCOPY Right 02/26/2016   Procedure: ARTHROSCOPY RIGHT KNEE WITH MENSICAL DEBRIDEMENT;  Surgeon: Ollen Gross, MD;  Location: [facility];  Service: Orthopedics;  Laterality: Right;   left breast biopsy     LESION REMOVAL Right 03/30/2013   Procedure: EXCISION OF NEOPLASM ARM;  Surgeon: Dalia Heading, MD;  Location: [facility];  Service: General;  Laterality: Right;   TUBAL LIGATION     Family  History  Problem Relation Age of Onset   Hypertension Father    Congestive Heart Failure Father    Liver cancer Mother    Hypertension Sister    Hypertension Brother    Diabetes Brother    Colon cancer Neg Hx    Colon polyps Neg Hx    Social History   Socioeconomic History   Marital status: Widowed    Spouse name: Not on file   Number of children: Not on file   Years of  education: Not on file   Highest education level: Not on file  Occupational History   Not on file  Tobacco Use   Smoking status: Never   Smokeless tobacco: Never  Vaping Use   Vaping Use: Never used  Substance and Sexual Activity   Alcohol use: No   Drug use: No   Sexual activity: Not Currently    Birth control/protection: Surgical  Other Topics Concern   Not on file  Social History Narrative   Not on file   Social Determinants of Health   Financial Resource Strain: Low Risk  (02/24/2022)   Overall Financial Resource Strain (CARDIA)    Difficulty of Paying Living Expenses: Not hard at all  Food Insecurity: No Food Insecurity (02/12/2021)   Hunger Vital Sign    Worried About Running Out of Food in the Last Year: Never true    Ran Out of Food in the Last Year: Never true  Transportation Needs: No Transportation Needs (02/24/2022)   PRAPARE - Administrator, Civil Service (Medical): No    Lack of Transportation (Non-Medical): No  Physical Activity: Insufficiently Active (02/24/2022)   Exercise Vital Sign    Days of Exercise per Week: 5 days    Minutes of Exercise per Session: 20 min  Stress: No Stress Concern Present (02/12/2021)   [employer] of Occupational Health - Occupational Stress Questionnaire    Feeling of Stress : Not at all  Social Connections: Moderately Isolated (02/24/2022)   Social Connection and Isolation Panel [NHANES]    Frequency of Communication with Friends and Family: More than three times a week    Frequency of Social Gatherings with Friends and Family: More than  three times a week    Attends Religious Services: More than 4 times per year    Active Member of [employer] or Organizations: No    Attends Banker Meetings: Never    Marital Status: Widowed    Tobacco Counseling Counseling given: Not Answered   Clinical Intake:                 Diabetic?yes Nutrition Risk Assessment:  Has the patient had any N/V/D within the last 2 months?  No  Does the patient have any non-healing wounds?  No  Has the patient had any unintentional weight loss or weight gain?  No   Diabetes:  Is the patient diabetic?  Yes  If diabetic, was a CBG obtained today?  No  Did the patient bring in their glucometer from home?  No  How often do you monitor your CBG's? daily.   Financial Strains and Diabetes Management:  Are you having any financial strains with the device, your supplies or your medication? No .  Does the patient want to be seen by Chronic Care Management for management of their diabetes?  No  Would the patient like to be referred to a Nutritionist or for Diabetic Management?  No   Diabetic Exams:  Diabetic Eye Exam: Completed 12/03/21 Diabetic Foot Exam: Completed 08/19/22           Activities of Daily Living     No data to display           Patient Care Team: Kerri Perches, MD as PCP - General Fields, Darleene Cleaver, MD (Inactive) (Gastroenterology) Stacey Drain, MD as Consulting Physician (Rheumatology) Galen Manila, Novella Olive, MD (Inactive) as Consulting Physician (Hematology and Oncology)  Indicate any recent Medical Services you may have received from other than  [facility] providers in the past year (date may be approximate).     Assessment:   This is a routine wellness examination for Jillian [name].  Hearing/Vision screen No results found.  Dietary issues and exercise activities discussed:     Goals Addressed   None   Depression Screen    02/03/2023    9:21 AM 01/08/2023   10:27 AM 12/23/2022    2:07 PM  10/21/2022    8:13 AM 08/19/2022    9:57 AM 05/20/2022    8:25 AM 04/24/2022    9:57 AM  PHQ 2/9 Scores  PHQ - 2 Score 3 0 0 0 0 0 0  PHQ- 9 Score 4          Fall Risk    02/03/2023    9:21 AM 01/08/2023   10:27 AM 12/23/2022    2:07 PM 10/21/2022    8:13 AM 08/19/2022    9:57 AM  Fall Risk   Falls in the past year? 0 0 1 0 0  Number falls in past yr: 0 0 0 0 0  Injury with Fall? 0 0 0 0 0  Risk for fall due to :     No Fall Risks  Follow up    Falls prevention discussed Falls evaluation completed    FALL RISK PREVENTION PERTAINING TO THE HOME:  Any stairs in or around the home? No  If so, are there any without handrails? No  Home free of loose throw rugs in walkways, pet beds, electrical cords, etc? Yes  Adequate lighting in your home to reduce risk of falls? Yes   ASSISTIVE DEVICES UTILIZED TO PREVENT FALLS:  Life alert? No  Use of a cane, walker or w/c? No  Grab bars in the bathroom? Yes  Shower chair or bench in shower? Yes  Elevated toilet seat or a handicapped toilet? No   TIMED UP AND GO:  Was the test performed? Yes .  Length of time to ambulate 10 feet: 8 sec.   Gait slow and steady without use of assistive device  Cognitive Function:        02/24/2022   10:51 AM 02/12/2021    9:20 AM 02/06/2020    9:23 AM 09/01/2019    9:39 AM 02/03/2019    9:31 AM  6CIT Screen  What Year? 0 points 0 points 0 points 0 points 0 points  What month? 0 points 0 points 0 points 0 points 0 points  What time? 0 points 0 points 0 points 0 points 0 points  Count back from 20 0 points 0 points 0 points 0 points 4 points  Months in reverse 0 points 0 points 0 points 0 points 0 points  Repeat phrase 0 points 0 points 4 points 0 points 0 points  Total Score 0 points 0 points 4 points 0 points 4 points    Immunizations Immunization History  Administered Date(s) Administered   Fluad Quad(high Dose 65+) 05/18/2019, 06/20/2020, 05/29/2021, 07/07/2022   H1N1 10/16/2008   Influenza  Whole 07/02/2007, 06/14/2009, 06/04/2010   Influenza,inj,Quad PF,6+ Mos 07/20/2013, 06/01/2014, 06/26/2015, 07/28/2016, 05/19/2017, 06/03/2018   Moderna SARS-COV2 Booster Vaccination 08/02/2020, 03/05/2021   Moderna Sars-Covid-2 Vaccination 10/01/2019, 10/31/2019   Pneumococcal Conjugate-13 09/28/2014   Pneumococcal Polysaccharide-23 04/20/2009   Td 05/01/2004   Td (Adult), 2 Lf Tetanus Toxid, Preservative Free 05/01/2004   Unspecified SARS-COV-2 Vaccination 07/07/2022   Zoster Recombinat (Shingrix) 04/22/2021, 12/31/2021   Zoster, Live 10/23/2006    TDAP status: Due, Education has  been provided regarding the importance of this vaccine. Advised may receive this vaccine at local pharmacy or Health Dept. Aware to provide a copy of the vaccination record if obtained from local pharmacy or Health Dept. Verbalized acceptance and understanding.  Flu Vaccine status: Up to date  Pneumococcal vaccine status: Up to date  Covid-19 vaccine status: Completed vaccines  Qualifies for Shingles Vaccine? Yes   Zostavax completed Yes   Shingrix Completed?: Yes  Screening Tests Health Maintenance  Topic Date Due   DTaP/Tdap/Td (3 - Tdap) 05/01/2014   COVID-19 Vaccine (4 - 2023-24 season) 09/01/2022   OPHTHALMOLOGY EXAM  12/04/2022   Diabetic kidney evaluation - Urine ACR  05/08/2023 (Originally 04/24/2022)   INFLUENZA VACCINE  04/23/2023   HEMOGLOBIN A1C  07/15/2023   FOOT EXAM  08/20/2023   Diabetic kidney evaluation - eGFR measurement  02/18/2024   Medicare Annual Wellness (AWV)  03/08/2024   Pneumonia Vaccine 58+ Years old  Completed   DEXA SCAN  Completed   Zoster Vaccines- Shingrix  Completed   HPV VACCINES  Aged Out   Hepatitis C Screening  Discontinued    Health Maintenance  Health Maintenance Due  Topic Date Due   DTaP/Tdap/Td (3 - Tdap) 05/01/2014   COVID-19 Vaccine (4 - 2023-24 season) 09/01/2022   OPHTHALMOLOGY EXAM  12/04/2022    Colorectal cancer screening: No longer  required.   Mammogram status: No longer required due to age.  Bone Density status: Completed 08/25/22. Results reflect: Bone density results: NORMAL. Repeat every 2 years.  Lung Cancer Screening: (Low Dose CT Chest recommended if Age 42-80 years, 30 pack-year currently smoking OR have quit w/in 15years.) does not qualify.   Lung Cancer Screening Referral: no  Additional Screening:  Hepatitis C Screening: does qualify; Completed 07/09/20  Vision Screening: Recommended annual ophthalmology exams for early detection of glaucoma and other disorders of the eye. Is the patient up to date with their annual eye exam?  Yes  Who is the provider or what is the name of the office in which the patient attends annual eye exams? N/a If pt is not established with a provider, would they like to be referred to a provider to establish care? No .   Dental Screening: Recommended annual dental exams for proper oral hygiene  Community Resource Referral / Chronic Care Management: CRR required this visit?  No   CCM required this visit?  No      Plan:     I have personally reviewed and noted the following in the patient's chart:   Medical and social history Use of alcohol, tobacco or illicit drugs  Current medications and supplements including opioid prescriptions. Patient is not currently taking opioid prescriptions. Functional ability and status Nutritional status Physical activity Advanced directives List of other physicians Hospitalizations, surgeries, and ER visits in previous 12 months Vitals Screenings to include cognitive, depression, and falls Referrals and appointments  In addition, I have reviewed and discussed with patient certain preventive protocols, quality metrics, and best practice recommendations. A written personalized care plan for preventive services as well as general preventive health recommendations were provided to patient.     Jasper Riling, CMA   03/09/2023

## 2023-03-09 NOTE — Patient Instructions (Signed)
  Jillian [name] , Thank you for taking time to come for your Medicare Wellness Visit. I appreciate your ongoing commitment to your health goals. Please review the following plan we discussed and let me know if I can assist you in the future.   These are the goals we discussed:  Goals       DIET - INCREASE WATER INTAKE      Exercise 3x per week (30 min per time)      Recommend starting a routine exercise program at least 3 days a week for 30-45 minutes at a time as tolerated.        LIFESTYLE - DECREASE FALLS RISK      Patient Stated (pt-stated)      None        This is a list of the screening recommended for you and due dates:  Health Maintenance  Topic Date Due   DTaP/Tdap/Td vaccine (3 - Tdap) 05/01/2014   COVID-19 Vaccine (4 - 2023-24 season) 09/01/2022   Eye exam for diabetics  12/04/2022   Yearly kidney health urinalysis for diabetes  05/08/2023*   Flu Shot  04/23/2023   Hemoglobin A1C  07/15/2023   Complete foot exam   08/20/2023   Yearly kidney function blood test for diabetes  02/18/2024   Medicare Annual Wellness Visit  03/08/2024   Pneumonia Vaccine  Completed   DEXA scan (bone density measurement)  Completed   Zoster (Shingles) Vaccine  Completed   HPV Vaccine  Aged Out   Hepatitis C Screening  Discontinued  *Topic was postponed. The date shown is not the original due date.

## 2023-03-12 ENCOUNTER — Ambulatory Visit (INDEPENDENT_AMBULATORY_CARE_PROVIDER_SITE_OTHER): Payer: Medicare Other | Admitting: Internal Medicine

## 2023-03-12 ENCOUNTER — Encounter: Payer: Self-pay | Admitting: Internal Medicine

## 2023-03-12 VITALS — BP 113/71 | HR 89 | Ht 64.0 in | Wt 197.8 lb

## 2023-03-12 DIAGNOSIS — H1032 Unspecified acute conjunctivitis, left eye: Secondary | ICD-10-CM | POA: Diagnosis not present

## 2023-03-12 MED ORDER — POLYMYXIN B-TRIMETHOPRIM 10000-0.1 UNIT/ML-% OP SOLN
1.0000 [drp] | OPHTHALMIC | 0 refills | Status: DC
Start: 2023-03-12 — End: 2023-06-02

## 2023-03-12 NOTE — Progress Notes (Signed)
Acute Office Visit  Subjective:    Patient ID: Jillian [name], female    DOB: May 15, 1941, 82 y.o.   MRN: [id number]  Chief Complaint  Patient presents with   Eye Pain    Patient left eye is swollen and painful    HPI Patient is in today for complaint left leg swelling, redness and pain since last evening.  She has watery discharge as well.  Denies any recent injury or foreign body like sensation in the eye.  She denies any visual disturbance currently.  Denies any itching of the left eye.  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   [facility]ospitalized at [facility]   Hypertension 2005   Mild obesity    Postmenopausal    Rectal bleeding    Viral meningitis    Wears glasses     Past Surgical History:  Procedure Laterality Date   ABDOMINAL HYSTERECTOMY  1992   fibroids   APPENDECTOMY     BREAST EXCISIONAL BIOPSY Right    benign   BREAST LUMPECTOMY WITH RADIOACTIVE SEED LOCALIZATION Right 10/05/2014   Procedure: BREAST LUMPECTOMY WITH RADIOACTIVE SEED LOCALIZATION;  Surgeon: Harriette Bouillon, MD;  Location: [facility];  Service: General;  Laterality: Right;   cataract Bilateral    COLONOSCOPY  2013   Dr. Darrick Penna :  internal hemorrhoids, mild left-sided diverticulosis   COLONOSCOPY N/A 09/21/2019   Procedure: COLONOSCOPY;  Surgeon: West Bali, MD;  Location: [facility];  Service: Endoscopy;  Laterality: N/A;  12:00pm   CYST EXCISION Right    arm   ESOPHAGOGASTRODUODENOSCOPY  2013   Dr. Darrick Penna: gastritis likely NSAID-induced. Duodenal polyp. Duodenitis and gastritis in setting of aspirin and Ibuprofen.    EYE SURGERY Right 06/07/2013   cataract   EYE SURGERY Left 09/14/2013   cataract   KNEE ARTHROSCOPY Right 02/26/2016   Procedure: ARTHROSCOPY  RIGHT KNEE WITH MENSICAL DEBRIDEMENT;  Surgeon: Ollen Gross, MD;  Location: [facility];  Service: Orthopedics;  Laterality: Right;   left breast biopsy     LESION REMOVAL Right 03/30/2013   Procedure: EXCISION OF NEOPLASM ARM;  Surgeon: Dalia Heading, MD;  Location: [facility];  Service: General;  Laterality: Right;   TUBAL LIGATION      Family History  Problem Relation Age of Onset   Hypertension Father    Congestive Heart Failure Father    Liver cancer Mother    Hypertension Sister    Hypertension Brother    Diabetes Brother    Colon cancer Neg Hx    Colon polyps Neg Hx     Social History   Socioeconomic History   Marital status: Widowed    Spouse name: Not on file   Number of children: Not on file   Years of education: Not on file   Highest education level: Not on file  Occupational History   Not on file  Tobacco Use   Smoking status: Never   Smokeless tobacco: Never  Vaping Use   Vaping Use: Never used  Substance and Sexual Activity   Alcohol use: No   Drug use: No   Sexual activity: Not Currently    Birth control/protection: Surgical  Other Topics Concern  Not on file  Social History Narrative   Not on file   Social Determinants of Health   Financial Resource Strain: Low Risk  (03/09/2023)   Overall Financial Resource Strain (CARDIA)    Difficulty of Paying Living Expenses: Not hard at all  Food Insecurity: No Food Insecurity (03/09/2023)   Hunger Vital Sign    Worried About Running Out of Food in the Last Year: Never true    Ran Out of Food in the Last Year: Never true  Transportation Needs: No Transportation Needs (03/09/2023)   PRAPARE - Administrator, Civil Service (Medical): No    Lack of Transportation (Non-Medical): No  Physical Activity: Insufficiently Active (03/09/2023)   Exercise Vital Sign    Days of Exercise per Week: 3 days    Minutes of Exercise per Session: 30 min  Stress: No Stress Concern Present (03/09/2023)   [employer] of  Occupational Health - Occupational Stress Questionnaire    Feeling of Stress : Not at all  Social Connections: Moderately Isolated (03/09/2023)   Social Connection and Isolation Panel [NHANES]    Frequency of Communication with Friends and Family: More than three times a week    Frequency of Social Gatherings with Friends and Family: More than three times a week    Attends Religious Services: More than 4 times per year    Active Member of [employer] or Organizations: No    Attends Banker Meetings: Never    Marital Status: Widowed  Intimate Partner Violence: Not At Risk (03/09/2023)   Humiliation, Afraid, Rape, and Kick questionnaire    Fear of Current or Ex-Partner: No    Emotionally Abused: No    Physically Abused: No    Sexually Abused: No    Outpatient Medications Prior to Visit  Medication Sig Dispense Refill   acetaminophen (TYLENOL) 500 MG tablet Take 1,000 mg by mouth every 6 (six) hours as needed for mild pain.     aspirin EC 81 MG tablet Take 1 tablet (81 mg total) by mouth every evening. (Patient taking differently: Take 81 mg by mouth at bedtime.) 90 tablet 1   benazepril (LOTENSIN) 5 MG tablet TAKE 1 TABLET(5 MG) BY MOUTH DAILY 90 tablet 1   Blood Glucose Monitoring Suppl (TRUE METRIX METER) w/Device KIT USE AS DIRECTED 1 kit 0   calcium carbonate (OS-CAL - DOSED IN MG OF ELEMENTAL CALCIUM) 1250 (500 Ca) MG tablet Take 1 tablet by mouth.     cholecalciferol (VITAMIN D3) 25 MCG (1000 UNIT) tablet Take 2 tablets (2,000 Units total) by mouth daily. 180 tablet 3   cloNIDine (CATAPRES) 0.3 MG tablet TAKE 1 TABLET BY MOUTH IN THE  EVENING AT 8 PM 100 tablet 2   cyanocobalamin (V-R VITAMIN B-12) 500 MCG tablet Take 1 tablet (500 mcg total) by mouth every other day. 100 tablet 1   cyclobenzaprine (FLEXERIL) 5 MG tablet Take 1 tablet (5 mg total) by mouth 3 (three) times daily as needed for muscle spasms. 30 tablet 1   linagliptin (TRADJENTA) 5 MG TABS tablet Take 1 tablet (5  mg total) by mouth daily. 90 tablet 3   pravastatin (PRAVACHOL) 10 MG tablet TAKE 1 TABLET BY MOUTH DAILY 100 tablet 2   predniSONE (DELTASONE) 20 MG tablet Take 1 tablet (20 mg total) by mouth daily with breakfast. 5 tablet 0   TRUE METRIX BLOOD GLUCOSE TEST test strip TEST ONE TIME DAILY 100 strip 2   No facility-administered medications prior to  visit.    Allergies  Allergen Reactions   Ciprofloxacin Itching   Metronidazole Itching   Codeine Nausea And Vomiting   Propoxyphene    Propoxyphene N-Acetaminophen Other (See Comments)    hallucinations    Review of Systems  Constitutional:  Negative for chills and fever.  HENT:  Negative for congestion, sinus pressure, sinus pain and sore throat.   Eyes:  Positive for pain, discharge and redness. Negative for visual disturbance.  Respiratory:  Negative for cough and shortness of breath.   Cardiovascular:  Negative for chest pain and palpitations.  Gastrointestinal:  Negative for abdominal pain, diarrhea, nausea and vomiting.  Endocrine: Negative for polydipsia and polyuria.  Genitourinary:  Negative for dysuria and hematuria.  Musculoskeletal:  Negative for neck pain and neck stiffness.  Skin:  Negative for rash.  Neurological:  Negative for dizziness and weakness.  Psychiatric/Behavioral:  Negative for agitation and behavioral problems.        Objective:    Physical Exam Constitutional:      General: She is not in acute distress.    Appearance: She is obese. She is not diaphoretic.  HENT:     Head: Normocephalic and atraumatic.     Nose: No congestion.  Eyes:     General: No scleral icterus.    Extraocular Movements: Extraocular movements intact.     Conjunctiva/sclera:     Left eye: Left conjunctiva is injected.  Cardiovascular:     Rate and Rhythm: Regular rhythm.     Heart sounds: Normal heart sounds. No murmur heard. Pulmonary:     Breath sounds: Normal breath sounds. No wheezing or rales.  Skin:    General:  Skin is warm.     Findings: No rash.  Neurological:     General: No focal deficit present.     Mental Status: She is alert and oriented to person, place, and time.  Psychiatric:        Mood and Affect: Mood normal.        Behavior: Behavior normal.     BP 113/71 (BP Location: Left Arm, Patient Position: Sitting, Cuff Size: Normal)   Pulse 89   Ht 5\' 4"  (1.626 m)   Wt 197 lb 12.8 oz (89.7 kg)   SpO2 93%   BMI 33.95 kg/m  Wt Readings from Last 3 Encounters:  03/12/23 197 lb 12.8 oz (89.7 kg)  03/09/23 199 lb 1.9 oz (90.3 kg)  02/25/23 199 lb (90.3 kg)        Assessment & Plan:   Problem List Items Addressed This Visit       Other   Acute bacterial conjunctivitis of left eye - Primary    Has unilateral eye erythema, swelling and pain - likely bacterial conjunctivitis Started empiric Polytrim eyedrops (allergic to fluoroquinolone) Warm compresses as needed      Relevant Medications   trimethoprim-polymyxin b (POLYTRIM) ophthalmic solution     Meds ordered this encounter  Medications   trimethoprim-polymyxin b (POLYTRIM) ophthalmic solution    Sig: Place 1 drop into the left eye every 4 (four) hours.    Dispense:  10 mL    Refill:  0     Shelsea Hangartner Concha Se, MD

## 2023-03-12 NOTE — Assessment & Plan Note (Signed)
Has unilateral eye erythema, swelling and pain - likely bacterial conjunctivitis Started empiric Polytrim eyedrops (allergic to fluoroquinolone) Warm compresses as needed

## 2023-04-28 ENCOUNTER — Ambulatory Visit (INDEPENDENT_AMBULATORY_CARE_PROVIDER_SITE_OTHER): Payer: Medicare Other | Admitting: Family Medicine

## 2023-04-28 ENCOUNTER — Encounter: Payer: Self-pay | Admitting: Family Medicine

## 2023-04-28 VITALS — BP 132/78 | HR 81 | Ht 64.0 in | Wt 197.0 lb

## 2023-04-28 DIAGNOSIS — M25471 Effusion, right ankle: Secondary | ICD-10-CM

## 2023-04-28 DIAGNOSIS — M25472 Effusion, left ankle: Secondary | ICD-10-CM

## 2023-04-28 MED ORDER — PREDNISONE 10 MG PO TABS
10.0000 mg | ORAL_TABLET | Freq: Two times a day (BID) | ORAL | 0 refills | Status: AC
Start: 2023-04-28 — End: 2023-05-03

## 2023-04-28 NOTE — Assessment & Plan Note (Addendum)
Chronic condition, Patient denies leg pain. Hx CKD will hold off trial on Lasix. Short course of Prednisone 10 mg twice daily x 5 days Advise patient follow a low-salt diet, wear support stockings, maintain an exercise routine 3-5 times a week, keep legs on elevated position raise legs on pillow above your heart while lying down.

## 2023-04-28 NOTE — Patient Instructions (Addendum)
        Great to see you today.  I have refilled the medication(s) we provide.    - Please take medications as prescribed. - Follow up with your primary health provider if any health concerns arises. - If symptoms worsen please contact your primary care provider and/or visit the emergency department.  

## 2023-04-28 NOTE — Progress Notes (Signed)
Patient Office Visit   Subjective   Patient ID: Jillian [name], female    DOB: August 03, 1941  Age: 82 y.o. MRN: [id number]  CC:  Chief Complaint  Patient presents with   Joint Swelling    Patient complains of continued R ankle swelling.    Eye Problem    Patient complains of sore bottom eyelid starting 4 days ago.     HPI Jillian [name] 82 year old female, presents to the clinic for chronic bilateral ankle swelling. 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   HPI    Outpatient Encounter Medications as of 04/28/2023  Medication Sig   acetaminophen (TYLENOL) 500 MG tablet Take 1,000 mg by mouth every 6 (six) hours as needed for mild pain.   aspirin EC 81 MG tablet Take 1 tablet (81 mg total) by mouth every evening. (Patient taking differently: Take 81 mg by mouth at bedtime.)   benazepril (LOTENSIN) 5 MG tablet TAKE 1 TABLET(5 MG) BY MOUTH DAILY   Blood Glucose Monitoring Suppl (TRUE METRIX METER) w/Device KIT USE AS DIRECTED   calcium carbonate (OS-CAL - DOSED IN MG OF ELEMENTAL CALCIUM) 1250 (500 Ca) MG tablet Take 1 tablet by mouth.   cholecalciferol (VITAMIN D3) 25 MCG (1000 UNIT) tablet Take 2 tablets (2,000 Units total) by mouth daily.   cloNIDine (CATAPRES) 0.3 MG tablet TAKE 1 TABLET BY MOUTH IN THE  EVENING AT 8 PM   cyanocobalamin (V-R VITAMIN B-12) 500 MCG tablet Take 1 tablet (500 mcg total) by mouth every other day.   cyclobenzaprine (FLEXERIL) 5 MG tablet Take 1 tablet (5 mg total) by mouth 3 (three) times daily as needed for muscle spasms.   linagliptin (TRADJENTA) 5 MG TABS tablet Take 1 tablet (5 mg total) by mouth  daily.   pravastatin (PRAVACHOL) 10 MG tablet TAKE 1 TABLET BY MOUTH DAILY   predniSONE (DELTASONE) 10 MG tablet Take 1 tablet (10 mg total) by mouth 2 (two) times daily with a meal for 5 days.   trimethoprim-polymyxin b (POLYTRIM) ophthalmic solution Place 1 drop into the left eye every 4 (four) hours.   TRUE METRIX BLOOD GLUCOSE TEST test strip TEST ONE TIME DAILY   [DISCONTINUED] predniSONE (DELTASONE) 20 MG tablet Take 1 tablet (20 mg total) by mouth daily with breakfast.   No facility-administered encounter medications on file as of 04/28/2023.    Past Surgical History:  Procedure Laterality Date   ABDOMINAL HYSTERECTOMY  1992   fibroids   APPENDECTOMY     BREAST EXCISIONAL BIOPSY Right    benign   BREAST LUMPECTOMY WITH RADIOACTIVE SEED LOCALIZATION Right 10/05/2014   Procedure: BREAST LUMPECTOMY WITH RADIOACTIVE SEED LOCALIZATION;  Surgeon: Harriette Bouillon, MD;  Location: [facility];  Service: General;  Laterality: Right;   cataract Bilateral    COLONOSCOPY  2013   Dr. Darrick Penna :  internal hemorrhoids, mild left-sided diverticulosis   COLONOSCOPY N/A 09/21/2019   Procedure: COLONOSCOPY;  Surgeon: West Bali, MD;  [facility];  Service: Endoscopy;  Laterality: N/A;  12:00pm   CYST EXCISION Right    arm   ESOPHAGOGASTRODUODENOSCOPY  2013   Dr. Darrick Penna: gastritis likely NSAID-induced. Duodenal polyp. Duodenitis and gastritis  in setting of aspirin and Ibuprofen.    EYE SURGERY Right 06/07/2013   cataract   EYE SURGERY Left 09/14/2013   cataract   KNEE ARTHROSCOPY Right 02/26/2016   Procedure: ARTHROSCOPY RIGHT KNEE WITH MENSICAL DEBRIDEMENT;  Surgeon: Ollen Gross, MD;  Location: [facility];  Service: Orthopedics;  Laterality: Right;   left breast biopsy     LESION REMOVAL Right 03/30/2013   Procedure: EXCISION OF NEOPLASM ARM;  Surgeon: Dalia Heading, MD;  Location: [facility];  Service: General;  Laterality: Right;   TUBAL LIGATION      Review of Systems   Constitutional:  Negative for chills and fever.  Eyes:  Negative for blurred vision, pain, discharge and redness.  Respiratory:  Negative for shortness of breath.   Cardiovascular:  Negative for chest pain.  Musculoskeletal:  Negative for falls and myalgias.      Objective    BP 132/78   Pulse 81   Ht 5\' 4"  (1.626 m)   Wt 197 lb (89.4 kg)   SpO2 96%   BMI 33.81 kg/m   Physical Exam Vitals reviewed.  Constitutional:      General: She is not in acute distress.    Appearance: Normal appearance. She is not ill-appearing, toxic-appearing or diaphoretic.  HENT:     Head: Normocephalic.  Eyes:     General:        Right eye: No discharge.        Left eye: No discharge.     Conjunctiva/sclera: Conjunctivae normal.     Pupils: Pupils are equal, round, and reactive to light.  Cardiovascular:     Rate and Rhythm: Normal rate.     Pulses: Normal pulses.     Heart sounds: Normal heart sounds.  Pulmonary:     Effort: Pulmonary effort is normal. No respiratory distress.     Breath sounds: Normal breath sounds.  Musculoskeletal:        General: Normal range of motion.     Cervical back: Normal range of motion.     Right lower leg: Edema present.     Left lower leg: Edema present.  Skin:    General: Skin is warm and dry.     Capillary Refill: Capillary refill takes less than 2 seconds.  Neurological:     General: No focal deficit present.     Mental Status: She is alert and oriented to person, place, and time.     Coordination: Coordination normal.     Gait: Gait normal.  Psychiatric:        Mood and Affect: Mood normal.        Behavior: Behavior normal.       Assessment & Plan:  Swelling of both ankles Assessment & Plan: Chronic condition, Patient denies leg pain. Hx CKD will hold off trial on Lasix. Short course of Prednisone 10 mg twice daily x 5 days Advise patient follow a low-salt diet, wear support stockings, maintain an exercise routine 3-5 times a week, keep legs  on elevated position raise legs on pillow above your heart while lying down.    Orders: -     predniSONE; Take 1 tablet (10 mg total) by mouth 2 (two) times daily with a meal for 5 days.  Dispense: 10 tablet; Refill: 0    Return if symptoms worsen or fail to improve.   Cruzita Lederer Newman Nip, FNP

## 2023-05-04 DIAGNOSIS — I129 Hypertensive chronic kidney disease with stage 1 through stage 4 chronic kidney disease, or unspecified chronic kidney disease: Secondary | ICD-10-CM | POA: Diagnosis not present

## 2023-05-04 DIAGNOSIS — K869 Disease of pancreas, unspecified: Secondary | ICD-10-CM | POA: Diagnosis not present

## 2023-05-04 DIAGNOSIS — E559 Vitamin D deficiency, unspecified: Secondary | ICD-10-CM | POA: Diagnosis not present

## 2023-05-04 DIAGNOSIS — R809 Proteinuria, unspecified: Secondary | ICD-10-CM | POA: Diagnosis not present

## 2023-05-04 DIAGNOSIS — N183 Chronic kidney disease, stage 3 unspecified: Secondary | ICD-10-CM | POA: Diagnosis not present

## 2023-05-08 DIAGNOSIS — M170 Bilateral primary osteoarthritis of knee: Secondary | ICD-10-CM | POA: Diagnosis not present

## 2023-05-11 DIAGNOSIS — E1122 Type 2 diabetes mellitus with diabetic chronic kidney disease: Secondary | ICD-10-CM | POA: Diagnosis not present

## 2023-05-11 DIAGNOSIS — E559 Vitamin D deficiency, unspecified: Secondary | ICD-10-CM | POA: Diagnosis not present

## 2023-05-11 DIAGNOSIS — I129 Hypertensive chronic kidney disease with stage 1 through stage 4 chronic kidney disease, or unspecified chronic kidney disease: Secondary | ICD-10-CM | POA: Diagnosis not present

## 2023-05-11 DIAGNOSIS — N1831 Chronic kidney disease, stage 3a: Secondary | ICD-10-CM | POA: Diagnosis not present

## 2023-05-21 ENCOUNTER — Other Ambulatory Visit: Payer: Self-pay | Admitting: Family Medicine

## 2023-05-21 DIAGNOSIS — E1159 Type 2 diabetes mellitus with other circulatory complications: Secondary | ICD-10-CM | POA: Diagnosis not present

## 2023-05-21 DIAGNOSIS — E785 Hyperlipidemia, unspecified: Secondary | ICD-10-CM | POA: Diagnosis not present

## 2023-05-21 DIAGNOSIS — I10 Essential (primary) hypertension: Secondary | ICD-10-CM | POA: Diagnosis not present

## 2023-05-21 DIAGNOSIS — E559 Vitamin D deficiency, unspecified: Secondary | ICD-10-CM | POA: Diagnosis not present

## 2023-05-22 LAB — HEPATIC FUNCTION PANEL
AG Ratio: 1.4 (calc) (ref 1.0–2.5)
ALT: 9 U/L (ref 6–29)
AST: 14 U/L (ref 10–35)
Albumin: 3.9 g/dL (ref 3.6–5.1)
Alkaline phosphatase (APISO): 64 U/L (ref 37–153)
Bilirubin, Direct: 0.1 mg/dL (ref 0.0–0.2)
Globulin: 2.7 g/dL (ref 1.9–3.7)
Indirect Bilirubin: 0.4 mg/dL (ref 0.2–1.2)
Total Bilirubin: 0.5 mg/dL (ref 0.2–1.2)
Total Protein: 6.6 g/dL (ref 6.1–8.1)

## 2023-05-22 LAB — HEMOGLOBIN A1C
Hgb A1c MFr Bld: 7.1 %{Hb} — ABNORMAL HIGH (ref <5.7)
Mean Plasma Glucose: 157 mg/dL
eAG (mmol/L): 8.7 mmol/L

## 2023-05-22 LAB — VITAMIN D 25 HYDROXY (VIT D DEFICIENCY, FRACTURES): Vit D, 25-Hydroxy: 75 ng/mL (ref 30–100)

## 2023-05-22 LAB — TSH: TSH: 1.22 m[IU]/L (ref 0.40–4.50)

## 2023-05-22 LAB — LIPID PANEL
Cholesterol: 128 mg/dL (ref <200)
HDL: 39 mg/dL — ABNORMAL LOW (ref >=50)
LDL Cholesterol (Calc): 71 mg/dL
Non-HDL Cholesterol (Calc): 89 mg/dL (ref <130)
Total CHOL/HDL Ratio: 3.3 (calc) (ref <5.0)
Triglycerides: 94 mg/dL (ref <150)

## 2023-05-27 ENCOUNTER — Encounter: Payer: Self-pay | Admitting: Family Medicine

## 2023-05-27 ENCOUNTER — Ambulatory Visit (INDEPENDENT_AMBULATORY_CARE_PROVIDER_SITE_OTHER): Payer: Medicare Other | Admitting: Family Medicine

## 2023-05-27 VITALS — BP 104/65 | HR 77 | Ht 64.0 in | Wt 196.1 lb

## 2023-05-27 DIAGNOSIS — E1169 Type 2 diabetes mellitus with other specified complication: Secondary | ICD-10-CM

## 2023-05-27 DIAGNOSIS — R198 Other specified symptoms and signs involving the digestive system and abdomen: Secondary | ICD-10-CM | POA: Diagnosis not present

## 2023-05-27 DIAGNOSIS — E785 Hyperlipidemia, unspecified: Secondary | ICD-10-CM

## 2023-05-27 DIAGNOSIS — E6609 Other obesity due to excess calories: Secondary | ICD-10-CM

## 2023-05-27 DIAGNOSIS — Z6832 Body mass index (BMI) 32.0-32.9, adult: Secondary | ICD-10-CM

## 2023-05-27 DIAGNOSIS — E1121 Type 2 diabetes mellitus with diabetic nephropathy: Secondary | ICD-10-CM | POA: Diagnosis not present

## 2023-05-27 DIAGNOSIS — I10 Essential (primary) hypertension: Secondary | ICD-10-CM | POA: Diagnosis not present

## 2023-05-27 NOTE — Patient Instructions (Addendum)
Keep annual exam as before, please bring med to visit  Please schedule eye exam, overdue  Urine ACR today if possible  Covid vaccine as soon as possible , then about 2 to 3 weeks later TdAP   You arer being referred to [facility] re chang in BM x 1 year  It is important that you exercise regularly at least 30 minutes 5 times a week. If you develop chest pain, have severe difficulty breathing, or feel very tired, stop exercising immediately and seek medical attention  Thanks for choosing [facility], we consider it a privelige to serve you.

## 2023-06-01 DIAGNOSIS — M79672 Pain in left foot: Secondary | ICD-10-CM | POA: Diagnosis not present

## 2023-06-01 DIAGNOSIS — B351 Tinea unguium: Secondary | ICD-10-CM | POA: Diagnosis not present

## 2023-06-01 DIAGNOSIS — M79675 Pain in left toe(s): Secondary | ICD-10-CM | POA: Diagnosis not present

## 2023-06-01 DIAGNOSIS — L609 Nail disorder, unspecified: Secondary | ICD-10-CM | POA: Diagnosis not present

## 2023-06-02 ENCOUNTER — Encounter: Payer: Self-pay | Admitting: Family Medicine

## 2023-06-02 DIAGNOSIS — R198 Other specified symptoms and signs involving the digestive system and abdomen: Secondary | ICD-10-CM | POA: Insufficient documentation

## 2023-06-02 NOTE — Assessment & Plan Note (Signed)
Controlled, no change in medication DASH diet and commitment to daily physical activity for a minimum of 30 minutes discussed and encouraged, as a part of hypertension management. The importance of attaining a healthy weight is also discussed.     05/27/2023    9:33 AM 04/28/2023    8:29 AM 03/12/2023    9:01 AM 03/09/2023   11:09 AM 02/25/2023    9:50 AM 02/03/2023    9:21 AM 01/08/2023   10:26 AM  BP/Weight  Systolic BP 104 132 113 134 131 130 118  Diastolic BP 65 78 71 78 50 74 78  Wt. (Lbs) 196.12 197 197.8 199.12 199 195.6 196.8  BMI 33.66 kg/m2 33.81 kg/m2 33.95 kg/m2 34.18 kg/m2 34.16 kg/m2 33.57 kg/m2 33.78 kg/m2

## 2023-06-02 NOTE — Progress Notes (Signed)
Jillian [name]     MRN: [id number]      DOB: February 24, 1941  Chief Complaint  Patient presents with   Follow-up    Follow up    HPI Jillian [name] is here for follow up and re-evaluation of chronic medical conditions, medication management and review of any available recent lab and radiology data.  Preventive health is updated, specifically  Cancer screening and Immunization.   Questions or concerns regarding consultations or procedures which the PT has had in the interim are  addressed. The PT denies any adverse reactions to current medications since the last visit.  There are no new concerns.  There are no specific complaints   ROS Denies recent fever or chills. Denies sinus pressure, nasal congestion, ear pain or sore throat. Denies chest congestion, productive cough or wheezing. Denies chest pains, palpitations and leg swelling Denies abdominal pain, nausea, vomiting,diarrhea or constipation.   Denies dysuria, frequency, hesitancy or incontinence. Denies joint pain, swelling and limitation in mobility. Denies headaches, seizures, numbness, or tingling. Denies depression, anxiety or insomnia. Denies skin break down or rash.   PE  BP 104/65 (BP Location: Right Arm, Patient Position: Sitting, Cuff Size: Large)   Pulse 77   Ht 5\' 4"  (1.626 m)   Wt 196 lb 1.9 oz (89 kg)   SpO2 94%   BMI 33.66 kg/m   Patient alert and oriented and in no cardiopulmonary distress.  HEENT: No facial asymmetry, EOMI,     Neck supple .  Chest: Clear to auscultation bilaterally.  CVS: S1, S2 no murmurs, no S3.Regular rate.  ABD: Soft non tender.   Ext: No edema  MS: decreased ROM spine, shoulders, hips and knees.  Skin: Intact, no ulcerations or rash noted.  Psych: Good eye contact, normal affect. Memory intact not anxious or depressed appearing.  CNS: CN 2-12 intact, power,  normal throughout.no focal deficits noted.   Assessment & Plan  Change in bowel movement 1 year h/o change  in BM, refer to GI to evaluate  Essential hypertension, benign Controlled, no change in medication DASH diet and commitment to daily physical activity for a minimum of 30 minutes discussed and encouraged, as a part of hypertension management. The importance of attaining a healthy weight is also discussed.     05/27/2023    9:33 AM 04/28/2023    8:29 AM 03/12/2023    9:01 AM 03/09/2023   11:09 AM 02/25/2023    9:50 AM 02/03/2023    9:21 AM 01/08/2023   10:26 AM  BP/Weight  Systolic BP 104 132 113 134 131 130 118  Diastolic BP 65 78 71 78 50 74 78  Wt. (Lbs) 196.12 197 197.8 199.12 199 195.6 196.8  BMI 33.66 kg/m2 33.81 kg/m2 33.95 kg/m2 34.18 kg/m2 34.16 kg/m2 33.57 kg/m2 33.78 kg/m2       Hyperlipidemia with target LDL less than 100 Hyperlipidemia:Low fat diet discussed and encouraged.   Lipid Panel  Lab Results  Component Value Date   CHOL 128 05/21/2023   HDL 39 (L) 05/21/2023   LDLCALC 71 05/21/2023   TRIG 94 05/21/2023   CHOLHDL 3.3 05/21/2023     No change in med , needs to increase exercise  Obese  Patient re-educated about  the importance of commitment to a  minimum of 150 minutes of exercise per week as able.  The importance of healthy food choices with portion control discussed, as well as eating regularly and within a 12 hour window most days. The  need to choose "clean , green" food 50 to 75% of the time is discussed, as well as to make water the primary drink and set a goal of 64 ounces water daily.       05/27/2023    9:33 AM 04/28/2023    8:29 AM 03/12/2023    9:01 AM  Weight /BMI  Weight 196 lb 1.9 oz 197 lb 197 lb 12.8 oz  Height 5\' 4"  (1.626 m) 5\' 4"  (1.626 m) 5\' 4"  (1.626 m)  BMI 33.66 kg/m2 33.81 kg/m2 33.95 kg/m2      Type 2 diabetes with nephropathy (HCC) Jillian [name] is reminded of the importance of commitment to daily physical activity for 30 minutes or more, as able and the need to limit carbohydrate intake to 30 to 60 grams per meal to help  with blood sugar control.   The need to take medication as prescribed, test blood sugar as directed, and to call between visits if there is a concern that blood sugar is uncontrolled is also discussed.   Jillian [name] is reminded of the importance of daily foot exam, annual eye examination, and good blood sugar, blood pressure and cholesterol control.     Latest Ref Rng & Units 05/21/2023    8:35 AM 02/18/2023    9:55 AM 01/13/2023    8:59 AM 10/30/2022    4:29 PM 10/08/2022   11:50 AM  Diabetic Labs  HbA1c <5.7 % of total Hgb 7.1   7.2   6.9   Chol <200 mg/dL 914   782     HDL > OR = 50 mg/dL 39   48     Calc LDL mg/dL (calc) 71   80     Triglycerides <150 mg/dL 94   90     Creatinine 0.44 - 1.00 mg/dL  9.56   2.13  0.86       05/27/2023    9:33 AM 04/28/2023    8:29 AM 03/12/2023    9:01 AM 03/09/2023   11:09 AM 02/25/2023    9:50 AM 02/03/2023    9:21 AM 01/08/2023   10:26 AM  BP/Weight  Systolic BP 104 132 113 134 131 130 118  Diastolic BP 65 78 71 78 50 74 78  Wt. (Lbs) 196.12 197 197.8 199.12 199 195.6 196.8  BMI 33.66 kg/m2 33.81 kg/m2 33.95 kg/m2 34.18 kg/m2 34.16 kg/m2 33.57 kg/m2 33.78 kg/m2      Latest Ref Rng & Units 01/12/2023   12:00 AM 08/19/2022    9:40 AM  Foot/eye exam completion dates  Eye Exam No Retinopathy No Retinopathy       Foot Form Completion   Done     This result is from an external source.

## 2023-06-02 NOTE — Assessment & Plan Note (Signed)
1 year h/o change in BM, refer to GI to evaluate

## 2023-06-02 NOTE — Assessment & Plan Note (Signed)
Hyperlipidemia:Low fat diet discussed and encouraged.   Lipid Panel  Lab Results  Component Value Date   CHOL 128 05/21/2023   HDL 39 (L) 05/21/2023   LDLCALC 71 05/21/2023   TRIG 94 05/21/2023   CHOLHDL 3.3 05/21/2023     No change in med , needs to increase exercise

## 2023-06-02 NOTE — Assessment & Plan Note (Addendum)
Jillian [name] is reminded of the importance of commitment to daily physical activity for 30 minutes or more, as able and the need to limit carbohydrate intake to 30 to 60 grams per meal to help with blood sugar control.   The need to take medication as prescribed, test blood sugar as directed, and to call between visits if there is a concern that blood sugar is uncontrolled is also discussed.   Jillian [name] is reminded of the importance of daily foot exam, annual eye examination, and good blood sugar, blood pressure and cholesterol control.     Latest Ref Rng & Units 05/21/2023    8:35 AM 02/18/2023    9:55 AM 01/13/2023    8:59 AM 10/30/2022    4:29 PM 10/08/2022   11:50 AM  Diabetic Labs  HbA1c <5.7 % of total Hgb 7.1   7.2   6.9   Chol <200 mg/dL 161   096     HDL > OR = 50 mg/dL 39   48     Calc LDL mg/dL (calc) 71   80     Triglycerides <150 mg/dL 94   90     Creatinine 0.44 - 1.00 mg/dL  0.45   4.09  8.11       05/27/2023    9:33 AM 04/28/2023    8:29 AM 03/12/2023    9:01 AM 03/09/2023   11:09 AM 02/25/2023    9:50 AM 02/03/2023    9:21 AM 01/08/2023   10:26 AM  BP/Weight  Systolic BP 104 132 113 134 131 130 118  Diastolic BP 65 78 71 78 50 74 78  Wt. (Lbs) 196.12 197 197.8 199.12 199 195.6 196.8  BMI 33.66 kg/m2 33.81 kg/m2 33.95 kg/m2 34.18 kg/m2 34.16 kg/m2 33.57 kg/m2 33.78 kg/m2      Latest Ref Rng & Units 01/12/2023   12:00 AM 08/19/2022    9:40 AM  Foot/eye exam completion dates  Eye Exam No Retinopathy No Retinopathy       Foot Form Completion   Done     This result is from an external source.

## 2023-06-02 NOTE — Assessment & Plan Note (Signed)
  Patient re-educated about  the importance of commitment to a  minimum of 150 minutes of exercise per week as able.  The importance of healthy food choices with portion control discussed, as well as eating regularly and within a 12 hour window most days. The need to choose "clean , green" food 50 to 75% of the time is discussed, as well as to make water the primary drink and set a goal of 64 ounces water daily.       05/27/2023    9:33 AM 04/28/2023    8:29 AM 03/12/2023    9:01 AM  Weight /BMI  Weight 196 lb 1.9 oz 197 lb 197 lb 12.8 oz  Height 5\' 4"  (1.626 m) 5\' 4"  (1.626 m) 5\' 4"  (1.626 m)  BMI 33.66 kg/m2 33.81 kg/m2 33.95 kg/m2

## 2023-06-09 ENCOUNTER — Encounter (INDEPENDENT_AMBULATORY_CARE_PROVIDER_SITE_OTHER): Payer: Self-pay | Admitting: *Deleted

## 2023-06-29 DIAGNOSIS — M79675 Pain in left toe(s): Secondary | ICD-10-CM | POA: Diagnosis not present

## 2023-06-29 DIAGNOSIS — L609 Nail disorder, unspecified: Secondary | ICD-10-CM | POA: Diagnosis not present

## 2023-06-29 DIAGNOSIS — M79672 Pain in left foot: Secondary | ICD-10-CM | POA: Diagnosis not present

## 2023-06-29 DIAGNOSIS — I739 Peripheral vascular disease, unspecified: Secondary | ICD-10-CM | POA: Diagnosis not present

## 2023-06-29 DIAGNOSIS — M79674 Pain in right toe(s): Secondary | ICD-10-CM | POA: Diagnosis not present

## 2023-06-29 DIAGNOSIS — M79671 Pain in right foot: Secondary | ICD-10-CM | POA: Diagnosis not present

## 2023-06-29 DIAGNOSIS — E1140 Type 2 diabetes mellitus with diabetic neuropathy, unspecified: Secondary | ICD-10-CM | POA: Diagnosis not present

## 2023-07-28 ENCOUNTER — Encounter: Payer: Medicare Other | Admitting: Family Medicine

## 2023-07-30 ENCOUNTER — Other Ambulatory Visit: Payer: Self-pay | Admitting: Family Medicine

## 2023-08-25 ENCOUNTER — Other Ambulatory Visit: Payer: Self-pay | Admitting: Family Medicine

## 2023-08-28 ENCOUNTER — Encounter: Payer: Self-pay | Admitting: Family Medicine

## 2023-08-28 ENCOUNTER — Ambulatory Visit (INDEPENDENT_AMBULATORY_CARE_PROVIDER_SITE_OTHER): Payer: Medicare Other | Admitting: Family Medicine

## 2023-08-28 VITALS — BP 120/84 | HR 74 | Resp 16 | Ht 64.0 in | Wt 195.0 lb

## 2023-08-28 DIAGNOSIS — Z7984 Long term (current) use of oral hypoglycemic drugs: Secondary | ICD-10-CM | POA: Diagnosis not present

## 2023-08-28 DIAGNOSIS — H6123 Impacted cerumen, bilateral: Secondary | ICD-10-CM | POA: Diagnosis not present

## 2023-08-28 DIAGNOSIS — I10 Essential (primary) hypertension: Secondary | ICD-10-CM | POA: Diagnosis not present

## 2023-08-28 DIAGNOSIS — E1121 Type 2 diabetes mellitus with diabetic nephropathy: Secondary | ICD-10-CM | POA: Diagnosis not present

## 2023-08-28 DIAGNOSIS — Z0001 Encounter for general adult medical examination with abnormal findings: Secondary | ICD-10-CM

## 2023-08-28 MED ORDER — GLIPIZIDE ER 2.5 MG PO TB24: 2.5000 mg | ORAL_TABLET | Freq: Every day | ORAL | 2 refills | Status: DC

## 2023-08-28 MED ORDER — AMLODIPINE BESYLATE 5 MG PO TABS: 5.0000 mg | ORAL_TABLET | Freq: Every day | ORAL | 2 refills | Status: DC

## 2023-08-28 NOTE — Assessment & Plan Note (Signed)
Ear irrigation successful with no complications in office

## 2023-08-28 NOTE — Patient Instructions (Addendum)
F/U in 6 to 8 weeks, re evaluate blood pressure and blood sugar  HBA1C, chem 7 and EGFR  and Urine aCR today  Nurse pls document recent TdAP   You need Covid vaccine  Stop clonidine and start new medication amlodipine 5 mg at bedtime for blood pressure . Clonidine is making your  mouth dry  Stop tradjenta and start glipizide 2.5 mg daily for blood sugar. Bring blood sugar log to next visit  please  Ears will be flushed at visit  Keep active and make healthy food choices  BEST for 2025!  Thanks for choosing [facility], we consider it a privelige to serve you.

## 2023-08-28 NOTE — Assessment & Plan Note (Signed)

## 2023-08-30 LAB — BMP8+EGFR
BUN/Creatinine Ratio: 8 — ABNORMAL LOW (ref 12–28)
BUN: 9 mg/dL (ref 8–27)
CO2: 22 mmol/L (ref 20–29)
Calcium: 9.3 mg/dL (ref 8.7–10.3)
Chloride: 107 mmol/L — ABNORMAL HIGH (ref 96–106)
Creatinine, Ser: 1.11 mg/dL — ABNORMAL HIGH (ref 0.57–1.00)
Glucose: 101 mg/dL — ABNORMAL HIGH (ref 70–99)
Potassium: 4.4 mmol/L (ref 3.5–5.2)
Sodium: 142 mmol/L (ref 134–144)
eGFR: 50 mL/min/{1.73_m2} — ABNORMAL LOW (ref >59)

## 2023-08-30 LAB — MICROALBUMIN / CREATININE URINE RATIO
Creatinine, Urine: 182.3 mg/dL
Microalb/Creat Ratio: 3 mg/g{creat} (ref 0–29)
Microalbumin, Urine: 6.2 ug/mL

## 2023-08-30 LAB — HEMOGLOBIN A1C
Est. average glucose Bld gHb Est-mCnc: 154 mg/dL
Hgb A1c MFr Bld: 7 % — ABNORMAL HIGH (ref 4.8–5.6)

## 2023-08-30 NOTE — Assessment & Plan Note (Signed)
Change to glipizide due to cost and re veal in 6 to 8 weeks Jillian [name] is reminded of the importance of commitment to daily physical activity for 30 minutes or more, as able and the need to limit carbohydrate intake to 30 to 60 grams per meal to help with blood sugar control.   The need to take medication as prescribed, test blood sugar as directed, and to call between visits if there is a concern that blood sugar is uncontrolled is also discussed.   Jillian [name] is reminded of the importance of daily foot exam, annual eye examination, and good blood sugar, blood pressure and cholesterol control.     Latest Ref Rng & Units 08/28/2023   11:25 AM 05/21/2023    8:35 AM 02/18/2023    9:55 AM 01/13/2023    8:59 AM 10/30/2022    4:29 PM  Diabetic Labs  HbA1c 4.8 - 5.6 % 7.0  7.1   7.2    Micro/Creat Ratio 0 - 29 mg/g creat 3       Chol <200 mg/dL  347   425    HDL > OR = 50 mg/dL  39   48    Calc LDL mg/dL (calc)  71   80    Triglycerides <150 mg/dL  94   90    Creatinine 0.57 - 1.00 mg/dL 9.56   3.87   5.64       08/28/2023   10:43 AM 08/28/2023   10:18 AM 05/27/2023    9:33 AM 04/28/2023    8:29 AM 03/12/2023    9:01 AM 03/09/2023   11:09 AM 02/25/2023    9:50 AM  BP/Weight  Systolic BP 120 139 104 132 113 134 131  Diastolic BP 84 79 65 78 71 78 50  Wt. (Lbs)  195 196.12 197 197.8 199.12 199  BMI  33.47 kg/m2 33.66 kg/m2 33.81 kg/m2 33.95 kg/m2 34.18 kg/m2 34.16 kg/m2      Latest Ref Rng & Units 08/28/2023   10:20 AM 01/12/2023   12:00 AM  Foot/eye exam completion dates  Eye Exam No Retinopathy  No Retinopathy      Foot Form Completion  Done      This result is from an external source.

## 2023-08-30 NOTE — Assessment & Plan Note (Signed)
Change to amlodipine due to s/e of dry mouth on clonidine and re eval in 6 to 8 weeks DASH diet and commitment to daily physical activity for a minimum of 30 minutes discussed and encouraged, as a part of hypertension management. The importance of attaining a healthy weight is also discussed.     08/28/2023   10:43 AM 08/28/2023   10:18 AM 05/27/2023    9:33 AM 04/28/2023    8:29 AM 03/12/2023    9:01 AM 03/09/2023   11:09 AM 02/25/2023    9:50 AM  BP/Weight  Systolic BP 120 139 104 132 113 134 131  Diastolic BP 84 79 65 78 71 78 50  Wt. (Lbs)  195 196.12 197 197.8 199.12 199  BMI  33.47 kg/m2 33.66 kg/m2 33.81 kg/m2 33.95 kg/m2 34.18 kg/m2 34.16 kg/m2

## 2023-08-30 NOTE — Progress Notes (Signed)
Jillian [name]     MRN: [id number]      DOB: 03-Dec-1940  Chief Complaint  Patient presents with   Annual Exam    HPI: Patient is in for annual physical exam.c/o excessive dry mouth and high cost of tradjenta C/o bilqteral ear pressure, left more than right Immunization is reviewed , and  updated if needed.   PE: BP 120/84   Pulse 74   Resp 16   Ht 5\' 4"  (1.626 m)   Wt 195 lb (88.5 kg)   SpO2 94%   BMI 33.47 kg/m   Pleasant  female, alert and oriented x 3, in no cardio-pulmonary distress. Afebrile. HEENT No facial trauma or asymetry. Sinuses non tender. Blateral cerumen impaction ,left greater than right Extra occullar muscles intact.. External ears normal, . Neck: supple, no adenopathy,JVD or thyromegaly.No bruits.  Chest: Clear to ascultation bilaterally.No crackles or wheezes. Non tender to palpation   Cardiovascular system; Heart sounds normal,  S1 and  S2 ,no S3.  No murmur, or thrill. Apical beat not displaced Peripheral pulses normal.  Abdomen: Soft, non tender, no organomegaly or masses. No bruits. Bowel sounds normal. No guarding, tenderness or rebound.     Musculoskeletal exam: Decreased  ROM of spine, hips , shoulders and knees. No deformity ,swelling or crepitus noted. No muscle wasting or atrophy.   Neurologic: Cranial nerves 2 to 12 intact. Power, tone ,sensation  normal throughout. No disturbance in gait. No tremor.  Skin: Intact, no ulceration, erythema , scaling or rash noted. Pigmentation normal throughout  Psych; Normal mood and affect. Judgement and concentration normal   Assessment & Plan:  Encounter for Medicare annual examination with abnormal findings Annual exam as documented. Counseling done  re healthy lifestyle involving commitment to 150 minutes exercise per week, heart healthy diet, and attaining healthy weight.The importance of adequate sleep also discussed. Regular seat belt use and home safety, is also  discussed. Changes in health habits are decided on by the patient with goals and time frames  set for achieving them. Immunization and cancer screening needs are specifically addressed at this visit.   Bilateral impacted cerumen Ear irrigation successful with no complications in office  Essential hypertension, benign Change to amlodipine due to s/e of dry mouth on clonidine and re eval in 6 to 8 weeks DASH diet and commitment to daily physical activity for a minimum of 30 minutes discussed and encouraged, as a part of hypertension management. The importance of attaining a healthy weight is also discussed.     08/28/2023   10:43 AM 08/28/2023   10:18 AM 05/27/2023    9:33 AM 04/28/2023    8:29 AM 03/12/2023    9:01 AM 03/09/2023   11:09 AM 02/25/2023    9:50 AM  BP/Weight  Systolic BP 120 139 104 132 113 134 131  Diastolic BP 84 79 65 78 71 78 50  Wt. (Lbs)  195 196.12 197 197.8 199.12 199  BMI  33.47 kg/m2 33.66 kg/m2 33.81 kg/m2 33.95 kg/m2 34.18 kg/m2 34.16 kg/m2       Type 2 diabetes with nephropathy (HCC) Change to glipizide due to cost and re veal in 6 to 8 weeks Ms. Kret is reminded of the importance of commitment to daily physical activity for 30 minutes or more, as able and the need to limit carbohydrate intake to 30 to 60 grams per meal to help with blood sugar control.   The need to take medication as prescribed, test blood  sugar as directed, and to call between visits if there is a concern that blood sugar is uncontrolled is also discussed.   Ms. Kleiser is reminded of the importance of daily foot exam, annual eye examination, and good blood sugar, blood pressure and cholesterol control.     Latest Ref Rng & Units 08/28/2023   11:25 AM 05/21/2023    8:35 AM 02/18/2023    9:55 AM 01/13/2023    8:59 AM 10/30/2022    4:29 PM  Diabetic Labs  HbA1c 4.8 - 5.6 % 7.0  7.1   7.2    Micro/Creat Ratio 0 - 29 mg/g creat 3       Chol <200 mg/dL  130   865    HDL > OR = 50 mg/dL   39   48    Calc LDL mg/dL (calc)  71   80    Triglycerides <150 mg/dL  94   90    Creatinine 0.57 - 1.00 mg/dL 7.84   6.96   2.95       08/28/2023   10:43 AM 08/28/2023   10:18 AM 05/27/2023    9:33 AM 04/28/2023    8:29 AM 03/12/2023    9:01 AM 03/09/2023   11:09 AM 02/25/2023    9:50 AM  BP/Weight  Systolic BP 120 139 104 132 113 134 131  Diastolic BP 84 79 65 78 71 78 50  Wt. (Lbs)  195 196.12 197 197.8 199.12 199  BMI  33.47 kg/m2 33.66 kg/m2 33.81 kg/m2 33.95 kg/m2 34.18 kg/m2 34.16 kg/m2      Latest Ref Rng & Units 08/28/2023   10:20 AM 01/12/2023   12:00 AM  Foot/eye exam completion dates  Eye Exam No Retinopathy  No Retinopathy      Foot Form Completion  Done      This result is from an external source.

## 2023-09-07 DIAGNOSIS — E1140 Type 2 diabetes mellitus with diabetic neuropathy, unspecified: Secondary | ICD-10-CM | POA: Diagnosis not present

## 2023-09-07 DIAGNOSIS — M79671 Pain in right foot: Secondary | ICD-10-CM | POA: Diagnosis not present

## 2023-09-07 DIAGNOSIS — I739 Peripheral vascular disease, unspecified: Secondary | ICD-10-CM | POA: Diagnosis not present

## 2023-09-07 DIAGNOSIS — M79675 Pain in left toe(s): Secondary | ICD-10-CM | POA: Diagnosis not present

## 2023-09-07 DIAGNOSIS — M79672 Pain in left foot: Secondary | ICD-10-CM | POA: Diagnosis not present

## 2023-09-07 DIAGNOSIS — M79674 Pain in right toe(s): Secondary | ICD-10-CM | POA: Diagnosis not present

## 2023-09-10 ENCOUNTER — Other Ambulatory Visit: Payer: Self-pay | Admitting: Family Medicine

## 2023-10-20 ENCOUNTER — Ambulatory Visit (INDEPENDENT_AMBULATORY_CARE_PROVIDER_SITE_OTHER): Payer: Medicare Other | Admitting: Family Medicine

## 2023-10-20 ENCOUNTER — Encounter: Payer: Self-pay | Admitting: Family Medicine

## 2023-10-20 VITALS — BP 128/80 | HR 78 | Ht 64.0 in | Wt 194.1 lb

## 2023-10-20 DIAGNOSIS — E785 Hyperlipidemia, unspecified: Secondary | ICD-10-CM

## 2023-10-20 DIAGNOSIS — I10 Essential (primary) hypertension: Secondary | ICD-10-CM | POA: Diagnosis not present

## 2023-10-20 DIAGNOSIS — E1169 Type 2 diabetes mellitus with other specified complication: Secondary | ICD-10-CM | POA: Diagnosis not present

## 2023-10-20 DIAGNOSIS — E559 Vitamin D deficiency, unspecified: Secondary | ICD-10-CM

## 2023-10-20 DIAGNOSIS — Z6833 Body mass index (BMI) 33.0-33.9, adult: Secondary | ICD-10-CM

## 2023-10-20 DIAGNOSIS — E66811 Obesity, class 1: Secondary | ICD-10-CM

## 2023-10-20 DIAGNOSIS — E1121 Type 2 diabetes mellitus with diabetic nephropathy: Secondary | ICD-10-CM

## 2023-10-20 NOTE — Patient Instructions (Addendum)
F/u early May, call if you need me sooner   BP is good , stay on current meds  CBC, Fasting lipid, cmp and EGfR,hBA1C, lipid, tSH and vit D 1 week before May appt please  Please get covid vaccine at your pharmacy  Best for 2025!  It is important that you exercise regularly at least 30 minutes 5 times a week. If you develop chest pain, have severe difficulty breathing, or feel very tired, stop exercising immediately and seek medical attention  ,Thanks for choosing [facility], we consider it a privelige to serve you

## 2023-10-27 ENCOUNTER — Encounter: Payer: Self-pay | Admitting: Family Medicine

## 2023-10-27 NOTE — Assessment & Plan Note (Signed)
 Hyperlipidemia:Low fat diet discussed and encouraged.   Lipid Panel  Lab Results  Component Value Date   CHOL 128 05/21/2023   HDL 39 (L) 05/21/2023   LDLCALC 71 05/21/2023   TRIG 94 05/21/2023   CHOLHDL 3.3 05/21/2023     Updated lab needed at/ before next visit. Needs to commit tpo regular exercise

## 2023-10-27 NOTE — Assessment & Plan Note (Addendum)
 Diabetes associated with hypertension, hyperlipidemia, and obesity  [name] is reminded of the importance of commitment to daily physical activity for 30 minutes or more, as able and the need to limit carbohydrate intake to 30 to 60 grams per meal to help with blood sugar control.   The need to take medication as prescribed, test blood sugar as directed, and to call between visits if there is a concern that blood sugar is uncontrolled is also discussed.   [name] is reminded of the importance of daily foot exam, annual eye examination, and good blood sugar, blood pressure and cholesterol control.     Latest Ref Rng & Units 08/28/2023   11:25 AM 05/21/2023    8:35 AM 02/18/2023    9:55 AM 01/13/2023    8:59 AM 10/30/2022    4:29 PM  Diabetic Labs  HbA1c 4.8 - 5.6 % 7.0  7.1   7.2    Micro/Creat Ratio 0 - 29 mg/g creat 3       Chol <200 mg/dL  871   854    HDL > OR = 50 mg/dL  39   48    Calc LDL mg/dL (calc)  71   80    Triglycerides <150 mg/dL  94   90    Creatinine 0.57 - 1.00 mg/dL 8.88   8.61   8.75       10/20/2023   11:19 AM 10/20/2023   11:16 AM 10/20/2023   10:44 AM 08/28/2023   10:43 AM 08/28/2023   10:18 AM 05/27/2023    9:33 AM 04/28/2023    8:29 AM  BP/Weight  Systolic BP 128 140 139 120 139 104 132  Diastolic BP 80 82 87 84 79 65 78  Wt. (Lbs)   194.12  195 196.12 197  BMI   33.32 kg/m2  33.47 kg/m2 33.66 kg/m2 33.81 kg/m2      Latest Ref Rng & Units 08/28/2023   10:20 AM 01/12/2023   12:00 AM  Foot/eye exam completion dates  Eye Exam No Retinopathy  No Retinopathy      Foot Form Completion  Done      This result is from an external source.       Updated lab needed at/ before next visit.

## 2023-10-27 NOTE — Assessment & Plan Note (Signed)
 Updated lab needed at/ before next visit.

## 2023-10-27 NOTE — Progress Notes (Signed)
Jillian [name]     MRN: [id number]      DOB: [birth year]  Chief Complaint  Patient presents with   Follow-up    Follow up blood pressure     HPI Jillian [name] is here for follow up and re-evaluation of chronic medical conditions, medication management and review of any available recent lab and radiology data.  Preventive health is updated, specifically  Cancer screening and Immunization.   Questions or concerns regarding consultations or procedures which the PT has had in the interim are  addressed. The PT denies any adverse reactions to current medications since the last visit.  There are no new concerns.  There are no specific complaints   ROS Denies recent fever or chills. Denies sinus pressure, nasal congestion, ear pain or sore throat. Denies chest congestion, productive cough or wheezing. Denies chest pains, palpitations and leg swelling Denies abdominal pain, nausea, vomiting,diarrhea or constipation.   Denies dysuria, frequency, hesitancy or incontinence. Denies joint pain, swelling and limitation in mobility. Denies headaches, seizures, numbness, or tingling. Denies depression, anxiety or insomnia. Denies skin break down or rash.   PE  BP 128/80   Pulse 78   Ht 5\' 4"  (1.626 m)   Wt 194 lb 1.9 oz (88.1 kg)   SpO2 95%   BMI 33.32 kg/m   Patient alert and oriented and in no cardiopulmonary distress.  HEENT: No facial asymmetry, EOMI,     Neck supple .  Chest: Clear to auscultation bilaterally.  CVS: S1, S2 no murmurs, no S3.Regular rate.  ABD: Soft non tender.   Ext: No edema  MS: Adequate ROM spine, shoulders, hips and knees.  Skin: Intact, no ulcerations or rash noted.  Psych: Good eye contact, normal affect. Memory intact not anxious or depressed appearing.  CNS: CN 2-12 intact, power,  normal throughout.no focal deficits noted.   Assessment & Plan  Vitamin D deficiency Updated lab needed at/ before next visit.   Type 2 diabetes mellitus  with other specified complication (HCC) Diabetes associated with hypertension, hyperlipidemia, and obesity  Jillian [name] is reminded of the importance of commitment to daily physical activity for 30 minutes or more, as able and the need to limit carbohydrate intake to 30 to 60 grams per meal to help with blood sugar control.   The need to take medication as prescribed, test blood sugar as directed, and to call between visits if there is a concern that blood sugar is uncontrolled is also discussed.   Jillian [name] is reminded of the importance of daily foot exam, annual eye examination, and good blood sugar, blood pressure and cholesterol control.     Latest Ref Rng & Units 08/28/2023   11:25 AM 05/21/2023    8:35 AM 02/18/2023    9:55 AM 01/13/2023    8:59 AM 10/30/2022    4:29 PM  Diabetic Labs  HbA1c 4.8 - 5.6 % 7.0  7.1   7.2    Micro/Creat Ratio 0 - 29 mg/g creat 3       Chol <200 mg/dL  132   440    HDL > OR = 50 mg/dL  39   48    Calc LDL mg/dL (calc)  71   80    Triglycerides <150 mg/dL  94   90    Creatinine 0.57 - 1.00 mg/dL 1.02   7.25   3.66       10/20/2023   11:19 AM 10/20/2023   11:16 AM 10/20/2023  10:44 AM 08/28/2023   10:43 AM 08/28/2023   10:18 AM 05/27/2023    9:33 AM 04/28/2023    8:29 AM  BP/Weight  Systolic BP 128 140 139 120 139 104 132  Diastolic BP 80 82 87 84 79 65 78  Wt. (Lbs)   194.12  195 196.12 197  BMI   33.32 kg/m2  33.47 kg/m2 33.66 kg/m2 33.81 kg/m2      Latest Ref Rng & Units 08/28/2023   10:20 AM 01/12/2023   12:00 AM  Foot/eye exam completion dates  Eye Exam No Retinopathy  No Retinopathy      Foot Form Completion  Done      This result is from an external source.       Updated lab needed at/ before next visit.   Hyperlipidemia with target LDL less than 100 Hyperlipidemia:Low fat diet discussed and encouraged.   Lipid Panel  Lab Results  Component Value Date   CHOL 128 05/21/2023   HDL 39 (L) 05/21/2023   LDLCALC 71 05/21/2023    TRIG 94 05/21/2023   CHOLHDL 3.3 05/21/2023     Updated lab needed at/ before next visit. Needs to commit tpo regular exercise   Obesity (BMI 30.0-34.9)  Patient re-educated about  the importance of commitment to a  minimum of 150 minutes of exercise per week as able.  The importance of healthy food choices with portion control discussed, as well as eating regularly and within a 12 hour window most days. The need to choose "clean , green" food 50 to 75% of the time is discussed, as well as to make water the primary drink and set a goal of 64 ounces water daily.       10/20/2023   10:44 AM 08/28/2023   10:18 AM 05/27/2023    9:33 AM  Weight /BMI  Weight 194 lb 1.9 oz 195 lb 196 lb 1.9 oz  Height 5\' 4"  (1.626 m) 5\' 4"  (1.626 m) 5\' 4"  (1.626 m)  BMI 33.32 kg/m2 33.47 kg/m2 33.66 kg/m2    Unchanged, decreased caloric intake and more regular exercise recommended

## 2023-10-27 NOTE — Assessment & Plan Note (Signed)
  Patient re-educated about  the importance of commitment to a  minimum of 150 minutes of exercise per week as able.  The importance of healthy food choices with portion control discussed, as well as eating regularly and within a 12 hour window most days. The need to choose "clean , green" food 50 to 75% of the time is discussed, as well as to make water the primary drink and set a goal of 64 ounces water daily.       10/20/2023   10:44 AM 08/28/2023   10:18 AM 05/27/2023    9:33 AM  Weight /BMI  Weight 194 lb 1.9 oz 195 lb 196 lb 1.9 oz  Height 5\' 4"  (1.626 m) 5\' 4"  (1.626 m) 5\' 4"  (1.626 m)  BMI 33.32 kg/m2 33.47 kg/m2 33.66 kg/m2    Unchanged, decreased caloric intake and more regular exercise recommended

## 2023-11-24 DIAGNOSIS — M79671 Pain in right foot: Secondary | ICD-10-CM | POA: Diagnosis not present

## 2023-11-24 DIAGNOSIS — I739 Peripheral vascular disease, unspecified: Secondary | ICD-10-CM | POA: Diagnosis not present

## 2023-11-24 DIAGNOSIS — M79672 Pain in left foot: Secondary | ICD-10-CM | POA: Diagnosis not present

## 2023-11-24 DIAGNOSIS — M79675 Pain in left toe(s): Secondary | ICD-10-CM | POA: Diagnosis not present

## 2023-11-24 DIAGNOSIS — E1140 Type 2 diabetes mellitus with diabetic neuropathy, unspecified: Secondary | ICD-10-CM | POA: Diagnosis not present

## 2023-11-24 DIAGNOSIS — M79674 Pain in right toe(s): Secondary | ICD-10-CM | POA: Diagnosis not present

## 2023-12-06 ENCOUNTER — Other Ambulatory Visit: Payer: Self-pay | Admitting: Family Medicine

## 2023-12-07 ENCOUNTER — Telehealth: Payer: Self-pay | Admitting: Family Medicine

## 2023-12-07 ENCOUNTER — Other Ambulatory Visit: Payer: Self-pay | Admitting: Family Medicine

## 2023-12-07 ENCOUNTER — Other Ambulatory Visit: Payer: Self-pay

## 2023-12-07 DIAGNOSIS — I129 Hypertensive chronic kidney disease with stage 1 through stage 4 chronic kidney disease, or unspecified chronic kidney disease: Secondary | ICD-10-CM | POA: Diagnosis not present

## 2023-12-07 DIAGNOSIS — E1122 Type 2 diabetes mellitus with diabetic chronic kidney disease: Secondary | ICD-10-CM | POA: Diagnosis not present

## 2023-12-07 DIAGNOSIS — N189 Chronic kidney disease, unspecified: Secondary | ICD-10-CM | POA: Diagnosis not present

## 2023-12-07 DIAGNOSIS — N1831 Chronic kidney disease, stage 3a: Secondary | ICD-10-CM | POA: Diagnosis not present

## 2023-12-07 DIAGNOSIS — E559 Vitamin D deficiency, unspecified: Secondary | ICD-10-CM | POA: Diagnosis not present

## 2023-12-07 MED ORDER — PRAVASTATIN SODIUM 10 MG PO TABS: 10.0000 mg | ORAL_TABLET | Freq: Every day | ORAL | 2 refills | Status: DC

## 2023-12-07 NOTE — Telephone Encounter (Signed)
 Prescription Request  12/07/2023  LOV: 10/20/2023  What is the name of the medication or equipment? pravastatin (PRAVACHOL) 10 MG tablet [[id number]]    Have you contacted your pharmacy to request a refill? No   Which pharmacy would you like this sent to?   [facility] [phone] - [place], [place] - [address] AT [facility] [address] [place] [address] Phone: [phone] Fax: [phone]    Patient notified that their request is being sent to the clinical staff for review and that they should receive a response within 2 business days.   Please advise at Mobile [phone] (mobile)

## 2023-12-07 NOTE — Telephone Encounter (Signed)
 Refills sent to pharmacy.

## 2023-12-08 ENCOUNTER — Encounter: Payer: Self-pay | Admitting: Family Medicine

## 2023-12-08 ENCOUNTER — Ambulatory Visit (INDEPENDENT_AMBULATORY_CARE_PROVIDER_SITE_OTHER): Admitting: Family Medicine

## 2023-12-08 VITALS — BP 134/60 | HR 66 | Ht 64.0 in | Wt 196.0 lb

## 2023-12-08 DIAGNOSIS — L720 Epidermal cyst: Secondary | ICD-10-CM | POA: Diagnosis not present

## 2023-12-08 DIAGNOSIS — I10 Essential (primary) hypertension: Secondary | ICD-10-CM | POA: Diagnosis not present

## 2023-12-08 DIAGNOSIS — L089 Local infection of the skin and subcutaneous tissue, unspecified: Secondary | ICD-10-CM | POA: Diagnosis not present

## 2023-12-08 DIAGNOSIS — E1169 Type 2 diabetes mellitus with other specified complication: Secondary | ICD-10-CM | POA: Diagnosis not present

## 2023-12-08 MED ORDER — AMPICILLIN 500 MG PO CAPS
500.0000 mg | ORAL_CAPSULE | Freq: Three times a day (TID) | ORAL | 0 refills | Status: DC
Start: 2023-12-08 — End: 2023-12-08

## 2023-12-08 MED ORDER — DOXYCYCLINE HYCLATE 100 MG PO TABS: 100.0000 mg | ORAL_TABLET | Freq: Two times a day (BID) | ORAL | 0 refills | Status: DC

## 2023-12-08 MED ORDER — PRAVASTATIN SODIUM 10 MG PO TABS: 10.0000 mg | ORAL_TABLET | Freq: Every day | ORAL | 2 refills | Status: DC

## 2023-12-08 NOTE — Assessment & Plan Note (Signed)
 Diabetes associated with hypertension and hyperlipidemia  Jillian [name] is reminded of the importance of commitment to daily physical activity for 30 minutes or more, as able and the need to limit carbohydrate intake to 30 to 60 grams per meal to help with blood sugar control.   The need to take medication as prescribed, test blood sugar as directed, and to call between visits if there is a concern that blood sugar is uncontrolled is also discussed.   Jillian [name] is reminded of the importance of daily foot exam, annual eye examination, and good blood sugar, blood pressure and cholesterol control.     Latest Ref Rng & Units 08/28/2023   11:25 AM 05/21/2023    8:35 AM 02/18/2023    9:55 AM 01/13/2023    8:59 AM 10/30/2022    4:29 PM  Diabetic Labs  HbA1c 4.8 - 5.6 % 7.0  7.1   7.2    Micro/Creat Ratio 0 - 29 mg/g creat 3       Chol <200 mg/dL  102   725    HDL > OR = 50 mg/dL  39   48    Calc LDL mg/dL (calc)  71   80    Triglycerides <150 mg/dL  94   90    Creatinine 0.57 - 1.00 mg/dL 3.66   4.40   3.47       12/08/2023    3:08 PM 12/08/2023    1:51 PM 10/20/2023   11:19 AM 10/20/2023   11:16 AM 10/20/2023   10:44 AM 08/28/2023   10:43 AM 08/28/2023   10:18 AM  BP/Weight  Systolic BP  134 425 140 139 120 139  Diastolic BP  60 80 82 87 84 79  Wt. (Lbs) 196 179.08   194.12  195  BMI 33.64 kg/m2 30.74 kg/m2   33.32 kg/m2  33.47 kg/m2      Latest Ref Rng & Units 08/28/2023   10:20 AM 01/12/2023   12:00 AM  Foot/eye exam completion dates  Eye Exam No Retinopathy  No Retinopathy      Foot Form Completion  Done      This result is from an external source.

## 2023-12-08 NOTE — Patient Instructions (Addendum)
 Keep f/u in May, call if you need me sooner  Please get fasting labs already ordered 3 to 5 days before May appointment  Nurse pls reprint and give lab order  You have a skin infection, small boil, in right armpit, 1 week of doxycycline is prescribed, this is an antibiotic  Thanks for choosing [facility], we consider it a privelige to serve you.

## 2023-12-08 NOTE — Assessment & Plan Note (Signed)
 1 week doxycycline prescribed , advised against traumatizing area, unable to extrude material with indirect pressure Call if worsens or fails to improve will refer to surgery

## 2023-12-08 NOTE — Assessment & Plan Note (Signed)
 Controlled, no change in medication

## 2023-12-08 NOTE — Progress Notes (Addendum)
 Jillian [name]     MRN: [id number]      DOB: 07-02-1941  Chief Complaint  Patient presents with   Acute Visit    Growth under rt arm pit. Pt does not report any pain or drainage.     HPI Jillian [name] is here for with 1 week h/o painful lump in right armpit, does not shave, no known inciting trauma, no drainsagr, fever or chills  ROS See HPI  No other concerns/ complaints voiced or addressed Denies recent fever or chills. Denies sinus pressure, nasal congestion, ear pain or sore throat. g. Denies depression, anxiety or insomnia. Denies skin break down or rash.   PE  BP 134/60   Pulse 66   Ht 5\' 4"  (1.626 m)   Wt 196 lb (88.9 kg)   SpO2 93%   BMI 33.64 kg/m    Patient alert and oriented and in no cardiopulmonary distress.  HEENT: No facial asymmetry, EOMI,     Neck supple .  Chest: Clear to auscultation bilaterally.  CVS: S1, S2 no murmurs, no S3.Regular rate.   Ext: No edema  MS: Adequate ROM spine, shoulders, hips and knees. Tender cyst in right armpit with mild erythema no ulcerations or rash noted.  Psych: Good eye contact, normal affect. Memory intact not anxious or depressed appearing.  CNS: CN 2-12 intact, power,  normal throughout.no focal deficits noted.   Assessment & Plan Infected epidermoid cyst 1 week doxycycline prescribed , advised against traumatizing area, unable to extrude material with indirect pressure Call if worsens or fails to improve will refer to surgery  Type 2 diabetes mellitus with other specified complication (HCC) Diabetes associated with hypertension and hyperlipidemia  Jillian [name] is reminded of the importance of commitment to daily physical activity for 30 minutes or more, as able and the need to limit carbohydrate intake to 30 to 60 grams per meal to help with blood sugar control.   The need to take medication as prescribed, test blood sugar as directed, and to call between visits if there is a concern that blood sugar  is uncontrolled is also discussed.   Jillian [name] is reminded of the importance of daily foot exam, annual eye examination, and good blood sugar, blood pressure and cholesterol control.     Latest Ref Rng & Units 08/28/2023   11:25 AM 05/21/2023    8:35 AM 02/18/2023    9:55 AM 01/13/2023    8:59 AM 10/30/2022    4:29 PM  Diabetic Labs  HbA1c 4.8 - 5.6 % 7.0  7.1   7.2    Micro/Creat Ratio 0 - 29 mg/g creat 3       Chol <200 mg/dL  409   811    HDL > OR = 50 mg/dL  39   48    Calc LDL mg/dL (calc)  71   80    Triglycerides <150 mg/dL  94   90    Creatinine 0.57 - 1.00 mg/dL 9.14   7.82   9.56       12/08/2023    3:08 PM 12/08/2023    1:51 PM 10/20/2023   11:19 AM 10/20/2023   11:16 AM 10/20/2023   10:44 AM 08/28/2023   10:43 AM 08/28/2023   10:18 AM  BP/Weight  Systolic BP  134 213 140 139 120 139  Diastolic BP  60 80 82 87 84 79  Wt. (Lbs) 196 179.08   194.12  195  BMI 33.64 kg/m2 30.74  kg/m2   33.32 kg/m2  33.47 kg/m2      Latest Ref Rng & Units 08/28/2023   10:20 AM 01/12/2023   12:00 AM  Foot/eye exam completion dates  Eye Exam No Retinopathy  No Retinopathy      Foot Form Completion  Done      This result is from an external source.        Essential hypertension, benign Controlled, no change in medication

## 2023-12-09 DIAGNOSIS — E1122 Type 2 diabetes mellitus with diabetic chronic kidney disease: Secondary | ICD-10-CM | POA: Diagnosis not present

## 2023-12-09 DIAGNOSIS — I129 Hypertensive chronic kidney disease with stage 1 through stage 4 chronic kidney disease, or unspecified chronic kidney disease: Secondary | ICD-10-CM | POA: Diagnosis not present

## 2023-12-09 DIAGNOSIS — N1831 Chronic kidney disease, stage 3a: Secondary | ICD-10-CM | POA: Diagnosis not present

## 2024-01-18 DIAGNOSIS — H524 Presbyopia: Secondary | ICD-10-CM | POA: Diagnosis not present

## 2024-01-18 DIAGNOSIS — Z961 Presence of intraocular lens: Secondary | ICD-10-CM | POA: Diagnosis not present

## 2024-01-18 LAB — HM DIABETES EYE EXAM

## 2024-01-28 ENCOUNTER — Ambulatory Visit (INDEPENDENT_AMBULATORY_CARE_PROVIDER_SITE_OTHER): Payer: Medicare Other | Admitting: Family Medicine

## 2024-01-28 ENCOUNTER — Encounter: Payer: Self-pay | Admitting: Family Medicine

## 2024-01-28 VITALS — BP 118/71 | HR 73 | Resp 16 | Ht 64.0 in | Wt 194.1 lb

## 2024-01-28 DIAGNOSIS — L720 Epidermal cyst: Secondary | ICD-10-CM

## 2024-01-28 DIAGNOSIS — L089 Local infection of the skin and subcutaneous tissue, unspecified: Secondary | ICD-10-CM | POA: Diagnosis not present

## 2024-01-28 DIAGNOSIS — E559 Vitamin D deficiency, unspecified: Secondary | ICD-10-CM | POA: Diagnosis not present

## 2024-01-28 DIAGNOSIS — I10 Essential (primary) hypertension: Secondary | ICD-10-CM

## 2024-01-28 DIAGNOSIS — E785 Hyperlipidemia, unspecified: Secondary | ICD-10-CM | POA: Diagnosis not present

## 2024-01-28 DIAGNOSIS — E66811 Obesity, class 1: Secondary | ICD-10-CM

## 2024-01-28 DIAGNOSIS — E1169 Type 2 diabetes mellitus with other specified complication: Secondary | ICD-10-CM | POA: Diagnosis not present

## 2024-01-28 DIAGNOSIS — E1121 Type 2 diabetes mellitus with diabetic nephropathy: Secondary | ICD-10-CM | POA: Diagnosis not present

## 2024-01-28 NOTE — Patient Instructions (Signed)
 F/U in  18 weeks, call if you need me sooner  Nurse pls get eye exam from [facility] in [place] done in 2025  Please get labs already ordered today, we will call you with results  Please help pt to log into and use My chart at checkout  It is important that you exercise regularly at least 30 minutes 5 times a week. If you develop chest pain, have severe difficulty breathing, or feel very tired, stop exercising immediately and seek medical attention   Think about what you will eat, plan ahead. Choose " clean, green, fresh or frozen" over canned, processed or packaged foods which are more sugary, salty and fatty. 70 to 75% of food eaten should be vegetables and fruit. Three meals at set times with snacks allowed between meals, but they must be fruit or vegetables. Aim to eat over a 12 hour period , example 7 am to 7 pm, and STOP after  your last meal of the day. Drink water ,generally about 64 ounces per day, no other drink is as healthy. Fruit juice is best enjoyed in a healthy way, by EATING the fruit.   Thanks for choosing [facility], we consider it a privelige to serve you.

## 2024-01-29 ENCOUNTER — Telehealth: Payer: Self-pay

## 2024-01-29 LAB — CMP14+EGFR
ALT: 10 IU/L (ref 0–32)
AST: 16 IU/L (ref 0–40)
Albumin: 4.3 g/dL (ref 3.7–4.7)
Alkaline Phosphatase: 87 IU/L (ref 44–121)
BUN/Creatinine Ratio: 11 — ABNORMAL LOW (ref 12–28)
BUN: 11 mg/dL (ref 8–27)
Bilirubin Total: 0.3 mg/dL (ref 0.0–1.2)
CO2: 22 mmol/L (ref 20–29)
Calcium: 9.4 mg/dL (ref 8.7–10.3)
Chloride: 107 mmol/L — ABNORMAL HIGH (ref 96–106)
Creatinine, Ser: 1.03 mg/dL — ABNORMAL HIGH (ref 0.57–1.00)
Globulin, Total: 2.6 g/dL (ref 1.5–4.5)
Glucose: 75 mg/dL (ref 70–99)
Potassium: 4.4 mmol/L (ref 3.5–5.2)
Sodium: 143 mmol/L (ref 134–144)
Total Protein: 6.9 g/dL (ref 6.0–8.5)
eGFR: 55 mL/min/{1.73_m2} — ABNORMAL LOW (ref >59)

## 2024-01-29 LAB — CBC
Hematocrit: 41.5 % (ref 34.0–46.6)
Hemoglobin: 13.1 g/dL (ref 11.1–15.9)
MCH: 29.7 pg (ref 26.6–33.0)
MCHC: 31.6 g/dL (ref 31.5–35.7)
MCV: 94 fL (ref 79–97)
Platelets: 156 10*3/uL (ref 150–450)
RBC: 4.41 x10E6/uL (ref 3.77–5.28)
RDW: 14.7 % (ref 11.7–15.4)
WBC: 4.3 10*3/uL (ref 3.4–10.8)

## 2024-01-29 LAB — HEMOGLOBIN A1C
Est. average glucose Bld gHb Est-mCnc: 154 mg/dL
Hgb A1c MFr Bld: 7 % — ABNORMAL HIGH (ref 4.8–5.6)

## 2024-01-29 LAB — LIPID PANEL
Chol/HDL Ratio: 3.2 ratio (ref 0.0–4.4)
Cholesterol, Total: 133 mg/dL (ref 100–199)
HDL: 42 mg/dL (ref >39)
LDL Chol Calc (NIH): 73 mg/dL (ref 0–99)
Triglycerides: 95 mg/dL (ref 0–149)
VLDL Cholesterol Cal: 18 mg/dL (ref 5–40)

## 2024-01-29 LAB — TSH: TSH: 1.27 u[IU]/mL (ref 0.450–4.500)

## 2024-01-29 LAB — VITAMIN D 25 HYDROXY (VIT D DEFICIENCY, FRACTURES): Vit D, 25-Hydroxy: 45.5 ng/mL (ref 30.0–100.0)

## 2024-01-29 NOTE — Telephone Encounter (Signed)
 Copied from CRM [id number]. Topic: Clinical - Lab/Test Results >> Jan 29, 2024 10:12 AM Carlatta H wrote: Reason for CRM: Please call patient with lab results

## 2024-01-29 NOTE — Telephone Encounter (Signed)
Will call when provider reviews.

## 2024-02-01 ENCOUNTER — Encounter: Payer: Self-pay | Admitting: Family Medicine

## 2024-02-01 NOTE — Assessment & Plan Note (Signed)
 Hyperlipidemia:Low fat diet discussed and encouraged.   Lipid Panel  Lab Results  Component Value Date   CHOL 133 01/28/2024   HDL 42 01/28/2024   LDLCALC 73 01/28/2024   TRIG 95 01/28/2024   CHOLHDL 3.2 01/28/2024     Controlled, no change in medication

## 2024-02-01 NOTE — Assessment & Plan Note (Signed)
 resolved

## 2024-02-01 NOTE — Assessment & Plan Note (Signed)
 Controlled, no change in medication DASH diet and commitment to daily physical activity for a minimum of 30 minutes discussed and encouraged, as a part of hypertension management. The importance of attaining a healthy weight is also discussed.     01/28/2024   10:20 AM 12/08/2023    3:08 PM 12/08/2023    1:51 PM 10/20/2023   11:19 AM 10/20/2023   11:16 AM 10/20/2023   10:44 AM 08/28/2023   10:43 AM  BP/Weight  Systolic BP 118  409 128 140 139 120  Diastolic BP 71  60 80 82 87 84  Wt. (Lbs) 194.12 196 179.08   194.12   BMI 33.32 kg/m2 33.64 kg/m2 30.74 kg/m2   33.32 kg/m2

## 2024-02-01 NOTE — Assessment & Plan Note (Signed)
  Patient re-educated about  the importance of commitment to a  minimum of 150 minutes of exercise per week as able.  The importance of healthy food choices with portion control discussed, as well as eating regularly and within a 12 hour window most days. The need to choose "clean , green" food 50 to 75% of the time is discussed, as well as to make water  the primary drink and set a goal of 64 ounces water  daily.       01/28/2024   10:20 AM 12/08/2023    3:08 PM 12/08/2023    1:51 PM  Weight /BMI  Weight 194 lb 1.9 oz 196 lb 179 lb 1.3 oz  Height 5\' 4"  (1.626 m)  5\' 4"  (1.626 m)  BMI 33.32 kg/m2 33.64 kg/m2 30.74 kg/m2    improved

## 2024-02-01 NOTE — Assessment & Plan Note (Signed)
 Diabetes associated with hypertension, hyperlipidemia, and arthritis  Jillian [name] is reminded of the importance of commitment to daily physical activity for 30 minutes or more, as able and the need to limit carbohydrate intake to 30 to 60 grams per meal to help with blood sugar control.   The need to take medication as prescribed, test blood sugar as directed, and to call between visits if there is a concern that blood sugar is uncontrolled is also discussed.   Jillian [name] is reminded of the importance of daily foot exam, annual eye examination, and good blood sugar, blood pressure and cholesterol control.     Latest Ref Rng & Units 01/28/2024   11:15 AM 08/28/2023   11:25 AM 05/21/2023    8:35 AM 02/18/2023    9:55 AM 01/13/2023    8:59 AM  Diabetic Labs  HbA1c 4.8 - 5.6 % 7.0  7.0  7.1   7.2   Micro/Creat Ratio 0 - 29 mg/g creat  3      Chol 100 - 199 mg/dL 528   413   244   HDL >01 mg/dL 42   39   48   Calc LDL 0 - 99 mg/dL 73   71   80   Triglycerides 0 - 149 mg/dL 95   94   90   Creatinine 0.57 - 1.00 mg/dL 0.27  2.53   6.64        01/28/2024   10:20 AM 12/08/2023    3:08 PM 12/08/2023    1:51 PM 10/20/2023   11:19 AM 10/20/2023   11:16 AM 10/20/2023   10:44 AM 08/28/2023   10:43 AM  BP/Weight  Systolic BP 118  403 128 140 139 120  Diastolic BP 71  60 80 82 87 84  Wt. (Lbs) 194.12 196 179.08   194.12   BMI 33.32 kg/m2 33.64 kg/m2 30.74 kg/m2   33.32 kg/m2       Latest Ref Rng & Units 08/28/2023   10:20 AM 01/12/2023   12:00 AM  Foot/eye exam completion dates  Eye Exam No Retinopathy  No Retinopathy      Foot Form Completion  Done      This result is from an external source.      Controlled, no change in medication

## 2024-02-01 NOTE — Progress Notes (Signed)
 Jillian [name]     MRN: [id number]      DOB: 1941-06-08  Chief Complaint  Patient presents with   Medical Management of Chronic Issues    Follow up      HPI Jillian [name] is here for follow up and re-evaluation of chronic medical conditions, medication management and review of any available recent lab and radiology data.  Preventive health is updated, specifically  Cancer screening and Immunization.   Questions or concerns regarding consultations or procedures which the PT has had in the interim are  addressed. The PT denies any adverse reactions to current medications since the last visit.  There are no new concerns.  There are no specific complaints   ROS Denies recent fever or chills. Denies sinus pressure, nasal congestion, ear pain or sore throat. Denies chest congestion, productive cough or wheezing. Denies chest pains, palpitations and leg swelling Denies abdominal pain, nausea, vomiting,diarrhea or constipation.   Denies dysuria, frequency, hesitancy or incontinence. Denies joint pain, swelling and limitation in mobility. Denies headaches, seizures, numbness, or tingling. Denies depression, anxiety or insomnia. Denies skin break down or rash.   PE  BP 118/71   Pulse 73   Resp 16   Ht 5\' 4"  (1.626 m)   Wt 194 lb 1.9 oz (88.1 kg)   SpO2 90%   BMI 33.32 kg/m   Patient alert and oriented and in no cardiopulmonary distress.  HEENT: No facial asymmetry, EOMI,     Neck supple .  Chest: Clear to auscultation bilaterally.  CVS: S1, S2 no murmurs, no S3.Regular rate.  ABD: Soft non tender.   Ext: No edema  MS: Adequate ROM spine, shoulders, hips and knees.  Skin: Intact, no ulcerations or rash noted.  Psych: Good eye contact, normal affect. Memory intact not anxious or depressed appearing.  CNS: CN 2-12 intact, power,  normal throughout.no focal deficits noted.   Assessment & Plan  Type 2 diabetes mellitus with other specified complication  (HCC) Diabetes associated with hypertension, hyperlipidemia, and arthritis  Jillian [name] is reminded of the importance of commitment to daily physical activity for 30 minutes or more, as able and the need to limit carbohydrate intake to 30 to 60 grams per meal to help with blood sugar control.   The need to take medication as prescribed, test blood sugar as directed, and to call between visits if there is a concern that blood sugar is uncontrolled is also discussed.   Jillian [name] is reminded of the importance of daily foot exam, annual eye examination, and good blood sugar, blood pressure and cholesterol control.     Latest Ref Rng & Units 01/28/2024   11:15 AM 08/28/2023   11:25 AM 05/21/2023    8:35 AM 02/18/2023    9:55 AM 01/13/2023    8:59 AM  Diabetic Labs  HbA1c 4.8 - 5.6 % 7.0  7.0  7.1   7.2   Micro/Creat Ratio 0 - 29 mg/g creat  3      Chol 100 - 199 mg/dL 409   811   914   HDL >78 mg/dL 42   39   48   Calc LDL 0 - 99 mg/dL 73   71   80   Triglycerides 0 - 149 mg/dL 95   94   90   Creatinine 0.57 - 1.00 mg/dL 2.95  6.21   3.08        01/28/2024   10:20 AM 12/08/2023    3:08  PM 12/08/2023    1:51 PM 10/20/2023   11:19 AM 10/20/2023   11:16 AM 10/20/2023   10:44 AM 08/28/2023   10:43 AM  BP/Weight  Systolic BP 118  161 128 140 139 120  Diastolic BP 71  60 80 82 87 84  Wt. (Lbs) 194.12 196 179.08   194.12   BMI 33.32 kg/m2 33.64 kg/m2 30.74 kg/m2   33.32 kg/m2       Latest Ref Rng & Units 08/28/2023   10:20 AM 01/12/2023   12:00 AM  Foot/eye exam completion dates  Eye Exam No Retinopathy  No Retinopathy      Foot Form Completion  Done      This result is from an external source.      Controlled, no change in medication   Vitamin D  deficiency Controlled, no change in medication   Hyperlipidemia with target LDL less than 100 Hyperlipidemia:Low fat diet discussed and encouraged.   Lipid Panel  Lab Results  Component Value Date   CHOL 133 01/28/2024   HDL 42  01/28/2024   LDLCALC 73 01/28/2024   TRIG 95 01/28/2024   CHOLHDL 3.2 01/28/2024     Controlled, no change in medication   Obesity (BMI 30.0-34.9)  Patient re-educated about  the importance of commitment to a  minimum of 150 minutes of exercise per week as able.  The importance of healthy food choices with portion control discussed, as well as eating regularly and within a 12 hour window most days. The need to choose "clean , green" food 50 to 75% of the time is discussed, as well as to make water  the primary drink and set a goal of 64 ounces water  daily.       01/28/2024   10:20 AM 12/08/2023    3:08 PM 12/08/2023    1:51 PM  Weight /BMI  Weight 194 lb 1.9 oz 196 lb 179 lb 1.3 oz  Height 5\' 4"  (1.626 m)  5\' 4"  (1.626 m)  BMI 33.32 kg/m2 33.64 kg/m2 30.74 kg/m2    improved  Infected epidermoid cyst resolved  Essential hypertension, benign Controlled, no change in medication DASH diet and commitment to daily physical activity for a minimum of 30 minutes discussed and encouraged, as a part of hypertension management. The importance of attaining a healthy weight is also discussed.     01/28/2024   10:20 AM 12/08/2023    3:08 PM 12/08/2023    1:51 PM 10/20/2023   11:19 AM 10/20/2023   11:16 AM 10/20/2023   10:44 AM 08/28/2023   10:43 AM  BP/Weight  Systolic BP 118  096 128 140 139 120  Diastolic BP 71  60 80 82 87 84  Wt. (Lbs) 194.12 196 179.08   194.12   BMI 33.32 kg/m2 33.64 kg/m2 30.74 kg/m2   33.32 kg/m2

## 2024-02-01 NOTE — Assessment & Plan Note (Signed)
 Controlled, no change in medication

## 2024-02-02 ENCOUNTER — Ambulatory Visit: Payer: Self-pay

## 2024-02-02 DIAGNOSIS — M79675 Pain in left toe(s): Secondary | ICD-10-CM | POA: Diagnosis not present

## 2024-02-02 DIAGNOSIS — E1140 Type 2 diabetes mellitus with diabetic neuropathy, unspecified: Secondary | ICD-10-CM | POA: Diagnosis not present

## 2024-02-02 DIAGNOSIS — I739 Peripheral vascular disease, unspecified: Secondary | ICD-10-CM | POA: Diagnosis not present

## 2024-02-02 DIAGNOSIS — M79671 Pain in right foot: Secondary | ICD-10-CM | POA: Diagnosis not present

## 2024-02-02 DIAGNOSIS — M79672 Pain in left foot: Secondary | ICD-10-CM | POA: Diagnosis not present

## 2024-02-02 DIAGNOSIS — M79674 Pain in right toe(s): Secondary | ICD-10-CM | POA: Diagnosis not present

## 2024-02-05 ENCOUNTER — Encounter: Payer: Self-pay | Admitting: Emergency Medicine

## 2024-02-05 ENCOUNTER — Ambulatory Visit: Payer: Self-pay

## 2024-02-05 ENCOUNTER — Ambulatory Visit
Admission: EM | Admit: 2024-02-05 | Discharge: 2024-02-05 | Disposition: A | Discharge status: Home / Self Care | Attending: Family Medicine | Admitting: Family Medicine

## 2024-02-05 DIAGNOSIS — K625 Hemorrhage of anus and rectum: Secondary | ICD-10-CM | POA: Diagnosis not present

## 2024-02-05 DIAGNOSIS — K648 Other hemorrhoids: Secondary | ICD-10-CM | POA: Diagnosis not present

## 2024-02-05 MED ORDER — HYDROCORTISONE (PERIANAL) 2.5 % EX CREA: 1.0000 | TOPICAL_CREAM | Freq: Two times a day (BID) | CUTANEOUS | 0 refills | Status: AC

## 2024-02-05 MED ORDER — HYDROCORTISONE ACETATE 25 MG RE SUPP: 25.0000 mg | Freq: Two times a day (BID) | RECTAL | 0 refills | Status: AC | PRN

## 2024-02-05 NOTE — Discharge Instructions (Addendum)
 We will check some labs today given your bleeding.  I have prescribed both a suppository and a rectal cream to help with the inflammation causing her bleeding.  Make sure to take a fiber supplement daily, drink plenty of water  and avoid straining with bowel movements.  Follow-up with your primary care provider for a recheck, return sooner for worsening symptoms.

## 2024-02-05 NOTE — Telephone Encounter (Signed)
 Chief Complaint: rectal bleeding Symptoms: see above Frequency: today Pertinent Negatives: Patient denies all other symptoms Disposition: [] ED /[x] Urgent Care (no appt availability in office) / [] Appointment(In office/virtual)/ []  [facility]/ [] Home Care/ [] Refused Recommended Disposition /[] [facility]/ []  Follow-up with PCP Additional Notes: Pt reports rectal bleeding today. Pt states she had a normal BM this AM before leaving the house. Then, later pt urinated and noticed she had passed blood without stool. Pt states it was a "small plop" in the toilet that turned the water  red. Denies clots. She states this happened 2-3 months ago and resolved. Pt denies abd pain, fever, dizziness, weakness, nausea/vomiting, diarrhea, constipation, hemorrhoids, rectal pain. Takes aspirin . RN advised pt she should be seen within 24 hours and advised UC d/t no availability in the office in that timeframe. Pt agreeable to that plan. RN advised pt she needs to go to the ED if she loses a large amount of blood, has multiple episodes of passing blood without stool, notices clots, or feels dizzy or weak. Pt verbalized understanding.     Copied from CRM [id number]. Topic: Clinical - Red Word Triage >> Feb 05, 2024 11:32 AM Crispin Dolphin wrote: Red Word that prompted transfer to Nurse Triage: blood coming from rectum Reason for Disposition  MODERATE rectal bleeding (small blood clots, passing blood without stool, or toilet water  turns red)  Answer Assessment - Initial Assessment Questions 1. APPEARANCE of BLOOD: "What color is it?" "Is it passed separately, on the surface of the stool, or mixed in with the stool?"      "Just one little plop, red" 2. AMOUNT: "How much blood was passed?"      One clot 3. FREQUENCY: "How many times has blood been passed with the stools?"      Pt had a normal BM this AM. Then, later she urinated and noticed she passed blood without stool 4. ONSET: "When was the blood  first seen in the stools?" (Days or weeks)      "Today I went to the bathroom and I saw blood in the commode" 5. DIARRHEA: "Is there also some diarrhea?" If Yes, ask: "How many diarrhea stools in the past 24 hours?"      No  6. CONSTIPATION: "Do you have constipation?" If Yes, ask: "How bad is it?"     No  7. RECURRENT SYMPTOMS: "Have you had blood in your stools before?" If Yes, ask: "When was the last time?" and "What happened that time?"      2-3 months ago and stopped 8. BLOOD THINNERS: "Do you take any blood thinners?" (e.g., Coumadin/warfarin, Pradaxa/dabigatran, aspirin )     Aspirin   9. OTHER SYMPTOMS: "Do you have any other symptoms?"  (e.g., abdomen pain, vomiting, dizziness, fever)     Nightly aspirin . Denies pain. Denies nausea, vomiting. Denies dizziness and weakness. Denies fever. No hx hemorrhoids  Protocols used: Rectal Bleeding-A-AH

## 2024-02-05 NOTE — ED Triage Notes (Signed)
 Noticed blood today after wiping rectum area and saw blood in toilet.  Lasts BM was this morning states BM was soft.

## 2024-02-06 LAB — CBC WITH DIFFERENTIAL/PLATELET
Basophils Absolute: 0 10*3/uL (ref 0.0–0.2)
Basos: 0 %
EOS (ABSOLUTE): 0 10*3/uL (ref 0.0–0.4)
Eos: 0 %
Hematocrit: 39.1 % (ref 34.0–46.6)
Hemoglobin: 12.3 g/dL (ref 11.1–15.9)
Immature Grans (Abs): 0 10*3/uL (ref 0.0–0.1)
Immature Granulocytes: 0 %
Lymphocytes Absolute: 3.3 10*3/uL — ABNORMAL HIGH (ref 0.7–3.1)
Lymphs: 44 %
MCH: 29.7 pg (ref 26.6–33.0)
MCHC: 31.5 g/dL (ref 31.5–35.7)
MCV: 94 fL (ref 79–97)
Monocytes Absolute: 0.7 10*3/uL (ref 0.1–0.9)
Monocytes: 9 %
Neutrophils Absolute: 3.4 10*3/uL (ref 1.4–7.0)
Neutrophils: 47 %
Platelets: 174 10*3/uL (ref 150–450)
RBC: 4.14 x10E6/uL (ref 3.77–5.28)
RDW: 14.7 % (ref 11.7–15.4)
WBC: 7.4 10*3/uL (ref 3.4–10.8)

## 2024-02-08 ENCOUNTER — Ambulatory Visit (HOSPITAL_COMMUNITY): Payer: Self-pay

## 2024-02-08 NOTE — ED Provider Notes (Signed)
 [facility]    CSN: [id number] Arrival date & time: 02/05/24  1432      History   Chief Complaint No chief complaint on file.   HPI Jillian [name] is a 83 y.o. female.   Patient presenting today with 1 day history of bright red blood with wiping that occurred earlier today.  Denies abdominal pain, anorectal pain, recent constipation, weakness, chest pain, shortness of breath.  So far not trying anything over-the-counter for symptoms.  States bleeding is not ongoing and was only when she was on the toilet.  States her primary care provider wants her to get labs.    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   Hypertension 2005   Mild obesity    Postmenopausal    Rectal bleeding    Viral meningitis    Wears glasses     Patient Active Problem List   Diagnosis Date Noted   Infected epidermoid cyst 12/08/2023   Encounter for Medicare annual examination with abnormal findings 08/28/2023   Swelling of both ankles 04/28/2023   DDD (degenerative disc disease), lumbar 02/03/2023   Vitamin D  deficiency 01/12/2023   Right shoulder strain 12/23/2022   Fatigue due to excessive exertion 08/19/2022   CKD (chronic kidney disease) stage 3, GFR 30-59 ml/min (HCC) 05/29/2021   Chronic heel pain, left 01/22/2021   Allergic reaction to drug 07/12/2020   Patellofemoral arthralgia of both knees 12/07/2019   Diverticulosis of colon 06/14/2019   Pancreatic lesion 06/14/2019   Obesity (BMI 30.0-34.9) 02/06/2019   Thoracic back pain 11/08/2018   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   Thrombocytopenia (HCC) 06/11/2018   Back pain with right-sided radiculopathy 04/28/2018   Impingement syndrome of left shoulder region 11/09/2017   Chronic left SI  joint pain 05/21/2016   Primary osteoarthritis of both knees 06/26/2015   Papilloma of right breast 09/29/2014   Thoracic spine pain 09/28/2014   Hip pain 08/06/2012   Cervical neck pain with evidence of disc disease 12/17/2011   Hot flashes, menopausal 09/10/2011   Type 2 diabetes mellitus with other specified complication (HCC) 01/08/2010   Hyperlipidemia with target LDL less than 100 10/12/2007   Essential hypertension, benign 10/12/2007    Past Surgical History:  Procedure Laterality Date   ABDOMINAL HYSTERECTOMY  1992   fibroids   APPENDECTOMY     BREAST EXCISIONAL BIOPSY Right    benign   BREAST LUMPECTOMY WITH RADIOACTIVE SEED LOCALIZATION Right 10/05/2014   Procedure: BREAST LUMPECTOMY WITH RADIOACTIVE SEED LOCALIZATION;  Surgeon: Sim Dryer, MD;  Location: [facility];  Service: General;  Laterality: Right;   cataract Bilateral    COLONOSCOPY  2013   Dr. Nolene Baumgarten :  internal hemorrhoids, mild left-sided diverticulosis   COLONOSCOPY N/A 09/21/2019   Procedure: COLONOSCOPY;  Surgeon: Alyce Jubilee, MD;  [facility];  Service: Endoscopy;  Laterality: N/A;  12:00pm   CYST EXCISION Right    arm   ESOPHAGOGASTRODUODENOSCOPY  2013   Dr. Nolene Baumgarten: gastritis likely NSAID-induced. Duodenal polyp. Duodenitis and gastritis in setting of aspirin  and Ibuprofen .    EYE SURGERY Right 06/07/2013   cataract   EYE SURGERY Left 09/14/2013   cataract  KNEE ARTHROSCOPY Right 02/26/2016   Procedure: ARTHROSCOPY RIGHT KNEE WITH MENSICAL DEBRIDEMENT;  Surgeon: Liliane Rei, MD;  Location: [facility];  Service: Orthopedics;  Laterality: Right;   left breast biopsy     LESION REMOVAL Right 03/30/2013   Procedure: EXCISION OF NEOPLASM ARM;  Surgeon: Beau Bound, MD;  Location: [facility];  Service: General;  Laterality: Right;   TUBAL LIGATION      OB History     Gravida  2   Para  2   Term  2   Preterm      AB      Living         SAB      IAB       Ectopic      Multiple      Live Births               Home Medications    Prior to Admission medications   Medication Sig Start Date End Date Taking? Authorizing Provider  hydrocortisone  (ANUSOL -HC) 2.5 % rectal cream Place 1 Application rectally 2 (two) times daily. 02/05/24  Yes Corbin Dess, PA-C  hydrocortisone  (ANUSOL -HC) 25 MG suppository Place 1 suppository (25 mg total) rectally 2 (two) times daily as needed for hemorrhoids or anal itching. 02/05/24  Yes Corbin Dess, PA-C  acetaminophen  (TYLENOL ) 500 MG tablet Take 1,000 mg by mouth every 6 (six) hours as needed for mild pain.    [provider]  aspirin  EC 81 MG tablet Take 1 tablet (81 mg total) by mouth every evening. Patient taking differently: Take 81 mg by mouth at bedtime. 04/23/16   Towanda Fret, MD  benazepril  (LOTENSIN ) 5 MG tablet TAKE 1 TABLET(5 MG) BY MOUTH DAILY 09/10/23   Towanda Fret, MD  Blood Glucose Monitoring Suppl (TRUE METRIX METER) w/Device KIT USE AS DIRECTED 01/30/21   Towanda Fret, MD  calcium carbonate (OS-CAL - DOSED IN MG OF ELEMENTAL CALCIUM) 1250 (500 Ca) MG tablet Take 1 tablet by mouth.    [provider]  cholecalciferol (VITAMIN D3) 25 MCG (1000 UNIT) tablet Take 2 tablets (2,000 Units total) by mouth daily. 12/31/21   Towanda Fret, MD  cloNIDine  (CATAPRES ) 0.1 MG tablet Take 1 tablet (0.1 mg total) by mouth daily. 10/20/23   Towanda Fret, MD  cyanocobalamin  (V-R VITAMIN B-12) 500 MCG tablet Take 1 tablet (500 mcg total) by mouth every other day. 02/26/23   Sonnie Dusky, PA-C  cyclobenzaprine  (FLEXERIL ) 5 MG tablet Take 1 tablet (5 mg total) by mouth 3 (three) times daily as needed for muscle spasms. 12/23/22   Tobi Fortes, MD  glipiZIDE  (GLUCOTROL  XL) 2.5 MG 24 hr tablet TAKE 1 TABLET(2.5 MG) BY MOUTH DAILY WITH BREAKFAST 12/07/23   Towanda Fret, MD  pravastatin  (PRAVACHOL ) 10 MG tablet Take 1 tablet (10 mg total)  by mouth daily. 12/08/23   Towanda Fret, MD  TRUE METRIX BLOOD GLUCOSE TEST test strip TEST ONE TIME DAILY 09/17/21   Towanda Fret, MD    Family History Family History  Problem Relation Age of Onset   Hypertension Father    Congestive Heart Failure Father    Liver cancer Mother    Hypertension Sister    Hypertension Brother    Diabetes Brother    Colon cancer Neg Hx    Colon polyps Neg Hx     Social History Social History   Tobacco Use   Smoking status:  Never   Smokeless tobacco: Never  Vaping Use   Vaping status: Never Used  Substance Use Topics   Alcohol use: No   Drug use: No     Allergies   Ciprofloxacin , Metronidazole , Codeine, Propoxyphene, and Propoxyphene n-acetaminophen    Review of Systems Review of Systems Per HPI  Physical Exam Triage Vital Signs ED Triage Vitals  Encounter Vitals Group     BP 02/05/24 1441 137/82     Systolic BP Percentile --      Diastolic BP Percentile --      Pulse Rate 02/05/24 1441 70     Resp 02/05/24 1441 18     Temp 02/05/24 1441 (!) 97.5 F (36.4 C)     Temp Source 02/05/24 1441 Oral     SpO2 02/05/24 1441 96 %     Weight --      Height --      Head Circumference --      Peak Flow --      Pain Score 02/05/24 1442 0     Pain Loc --      Pain Education --      Exclude from Growth Chart --    No data found.  Updated Vital Signs BP 137/82 (BP Location: Right Arm)   Pulse 70   Temp (!) 97.5 F (36.4 C) (Oral)   Resp 18   SpO2 96%   Visual Acuity Right Eye Distance:   Left Eye Distance:   Bilateral Distance:    Right Eye Near:   Left Eye Near:    Bilateral Near:     Physical Exam Vitals and nursing note reviewed. Exam conducted with a chaperone present.  Constitutional:      Appearance: Normal appearance. She is not ill-appearing.  HENT:     Head: Atraumatic.     Mouth/Throat:     Mouth: Mucous membranes are moist.     Pharynx: Oropharynx is clear.  Eyes:     Extraocular Movements:  Extraocular movements intact.     Conjunctiva/sclera: Conjunctivae normal.  Cardiovascular:     Rate and Rhythm: Normal rate and regular rhythm.     Heart sounds: Normal heart sounds.  Pulmonary:     Effort: Pulmonary effort is normal.     Breath sounds: Normal breath sounds.  Abdominal:     General: Bowel sounds are normal. There is no distension.     Palpations: Abdomen is soft.     Tenderness: There is no abdominal tenderness. There is no right CVA tenderness, left CVA tenderness or guarding.  Genitourinary:    Comments: Mild inflamed hemorrhoids present on exam internally with slight prolapse externally.  No obvious fissures, abscesses, active bleeding or drainage Musculoskeletal:        General: Normal range of motion.     Cervical back: Normal range of motion and neck supple.  Skin:    General: Skin is warm and dry.  Neurological:     Mental Status: She is alert and oriented to person, place, and time.  Psychiatric:        Mood and Affect: Mood normal.        Thought Content: Thought content normal.        Judgment: Judgment normal.    UC Treatments / Results  Labs (all labs ordered are listed, but only abnormal results are displayed) Labs Reviewed  CBC WITH DIFFERENTIAL/PLATELET - Abnormal; Notable for the following components:      Result Value   Lymphocytes Absolute  3.3 (*)    All other components within normal limits   Narrative:    Performed at:  [address], [place], [place]  [id number] Lab Director: Pearlean Botts MD, Phone:  [phone]    EKG   Radiology No results found.  Procedures Procedures (including critical care time)  Medications Ordered in UC Medications - No data to display  Initial Impression / Assessment and Plan / UC Course  I have reviewed the triage vital signs and the nursing notes.  Pertinent labs & imaging results that were available during my care of the patient were reviewed by me and considered in my  medical decision making (see chart for details).     Suspect inflamed hemorrhoids causing the episode of bleeding.  Discussed good bowel regimen, Anusol , close PCP follow-up.  CBC pending per patient request.  Final Clinical Impressions(s) / UC Diagnoses   Final diagnoses:  Inflamed internal hemorrhoid  Rectal bleeding     Discharge Instructions      We will check some labs today given your bleeding.  I have prescribed both a suppository and a rectal cream to help with the inflammation causing her bleeding.  Make sure to take a fiber supplement daily, drink plenty of water  and avoid straining with bowel movements.  Follow-up with your primary care provider for a recheck, return sooner for worsening symptoms.  ED Prescriptions     Medication Sig Dispense Auth. Provider   hydrocortisone  (ANUSOL -HC) 25 MG suppository Place 1 suppository (25 mg total) rectally 2 (two) times daily as needed for hemorrhoids or anal itching. 20 suppository Tacy Expose [place], PA-C   hydrocortisone  (ANUSOL -HC) 2.5 % rectal cream Place 1 Application rectally 2 (two) times daily. 80 g Corbin Dess, [place]      PDMP not reviewed this encounter.   Corbin Dess, [place] 02/08/24 1835

## 2024-02-22 ENCOUNTER — Encounter (HOSPITAL_COMMUNITY): Payer: Self-pay

## 2024-02-22 ENCOUNTER — Inpatient Hospital Stay: Payer: Medicare Other | Attending: Hematology

## 2024-02-22 ENCOUNTER — Ambulatory Visit (HOSPITAL_COMMUNITY)
Admission: RE | Admit: 2024-02-22 | Discharge: 2024-02-22 | Disposition: A | Discharge status: Home / Self Care | Payer: Medicare Other | Source: Ambulatory Visit | Attending: Physician Assistant | Admitting: Physician Assistant

## 2024-02-22 DIAGNOSIS — Z1231 Encounter for screening mammogram for malignant neoplasm of breast: Secondary | ICD-10-CM | POA: Diagnosis not present

## 2024-02-22 DIAGNOSIS — E559 Vitamin D deficiency, unspecified: Secondary | ICD-10-CM | POA: Insufficient documentation

## 2024-02-22 DIAGNOSIS — D241 Benign neoplasm of right breast: Secondary | ICD-10-CM | POA: Diagnosis not present

## 2024-02-22 DIAGNOSIS — N6091 Unspecified benign mammary dysplasia of right breast: Secondary | ICD-10-CM | POA: Diagnosis not present

## 2024-02-22 DIAGNOSIS — Z800 Family history of malignant neoplasm of digestive organs: Secondary | ICD-10-CM | POA: Insufficient documentation

## 2024-02-22 DIAGNOSIS — E538 Deficiency of other specified B group vitamins: Secondary | ICD-10-CM | POA: Insufficient documentation

## 2024-02-22 LAB — COMPREHENSIVE METABOLIC PANEL WITH GFR
ALT: 13 U/L (ref 0–44)
AST: 18 U/L (ref 15–41)
Albumin: 3.5 g/dL (ref 3.5–5.0)
Alkaline Phosphatase: 66 U/L (ref 38–126)
Anion gap: 8 (ref 5–15)
BUN: 11 mg/dL (ref 8–23)
CO2: 25 mmol/L (ref 22–32)
Calcium: 8.8 mg/dL — ABNORMAL LOW (ref 8.9–10.3)
Chloride: 110 mmol/L (ref 98–111)
Creatinine, Ser: 1.07 mg/dL — ABNORMAL HIGH (ref 0.44–1.00)
GFR, Estimated: 52 mL/min — ABNORMAL LOW (ref >60)
Glucose, Bld: 141 mg/dL — ABNORMAL HIGH (ref 70–99)
Potassium: 4 mmol/L (ref 3.5–5.1)
Sodium: 143 mmol/L (ref 135–145)
Total Bilirubin: 0.4 mg/dL (ref 0.0–1.2)
Total Protein: 6.9 g/dL (ref 6.5–8.1)

## 2024-02-22 LAB — IRON AND TIBC
Iron: 68 ug/dL (ref 28–170)
Saturation Ratios: 24 % (ref 10.4–31.8)
TIBC: 283 ug/dL (ref 250–450)
UIBC: 215 ug/dL

## 2024-02-22 LAB — CBC WITH DIFFERENTIAL/PLATELET
Abs Immature Granulocytes: 0.02 10*3/uL (ref 0.00–0.07)
Basophils Absolute: 0 10*3/uL (ref 0.0–0.1)
Basophils Relative: 0 %
Eosinophils Absolute: 0 10*3/uL (ref 0.0–0.5)
Eosinophils Relative: 0 %
HCT: 39.7 % (ref 36.0–46.0)
Hemoglobin: 12.5 g/dL (ref 12.0–15.0)
Immature Granulocytes: 0 %
Lymphocytes Relative: 44 %
Lymphs Abs: 2.2 10*3/uL (ref 0.7–4.0)
MCH: 30.2 pg (ref 26.0–34.0)
MCHC: 31.5 g/dL (ref 30.0–36.0)
MCV: 95.9 fL (ref 80.0–100.0)
Monocytes Absolute: 0.3 10*3/uL (ref 0.1–1.0)
Monocytes Relative: 7 %
Neutro Abs: 2.4 10*3/uL (ref 1.7–7.7)
Neutrophils Relative %: 49 %
Platelets: 160 10*3/uL (ref 150–400)
RBC: 4.14 MIL/uL (ref 3.87–5.11)
RDW: 14.6 % (ref 11.5–15.5)
Smear Review: NORMAL
WBC: 5 10*3/uL (ref 4.0–10.5)
nRBC: 0 % (ref 0.0–0.2)

## 2024-02-22 LAB — VITAMIN B12: Vitamin B-12: 407 pg/mL (ref 180–914)

## 2024-02-22 LAB — FERRITIN: Ferritin: 48 ng/mL (ref 11–307)

## 2024-02-22 LAB — VITAMIN D 25 HYDROXY (VIT D DEFICIENCY, FRACTURES): Vit D, 25-Hydroxy: 42.23 ng/mL (ref 30–100)

## 2024-02-24 ENCOUNTER — Ambulatory Visit: Payer: Self-pay

## 2024-02-24 NOTE — Telephone Encounter (Signed)
 Copied from CRM [id number]. Topic: Clinical - Red Word Triage >> Feb 24, 2024  8:14 AM Lizabeth Riggs wrote: Red Word that prompted transfer to Nurse Triage:  On 02/05/24 she went to urgent care and had a Inflamed internal hemorrhoid. They gave her some cream and it was helping. Yesterday morning she saw blood in stool.  Chief Complaint: rectal bleeding Symptoms: rectal blood on tissue noted after regular bowel movement Frequency: yesterday Pertinent Negatives: Patient denies constipation, diarrhea or fever Disposition: [] ED /[] Urgent Care (no appt availability in office) / [x] Appointment(In office/virtual)/ []  [facility]/ [] Home Care/ [] Refused Recommended Disposition /[] [facility]/ []  Follow-up with PCP Additional Notes: pt stated small amount of blood on tissue: no abd pain  - Reason for Disposition  [1] Rectal bleeding is minimal (e.g., blood just on toilet paper, few drops, streaks on surface of normal formed BM) AND [2] bleeding recurs 3 or more times on treatment  Answer Assessment - Initial Assessment Questions 1. APPEARANCE of BLOOD: "What color is it?" "Is it passed separately, on the surface of the stool, or mixed in with the stool?"      Bright red blood found on tissue after bowel movement 2. AMOUNT: "How much blood was passed?"      small 3. FREQUENCY: "How many times has blood been passed with the stools?"      x1 4. ONSET: "When was the blood first seen in the stools?" (Days or weeks)      02/23/2024 5. DIARRHEA: "Is there also some diarrhea?" If Yes, ask: "How many diarrhea stools in the past 24 hours?"      no 6. CONSTIPATION: "Do you have constipation?" If Yes, ask: "How bad is it?"     no 7. RECURRENT SYMPTOMS: "Have you had blood in your stools before?" If Yes, ask: "When was the last time?" and "What happened that time?"      N/a 8. BLOOD THINNERS: "Do you take any blood thinners?" (e.g., Coumadin/warfarin, Pradaxa/dabigatran, aspirin )     N/a 9.  OTHER SYMPTOMS: "Do you have any other symptoms?"  (e.g., abdomen pain, vomiting, dizziness, fever)     no 10. PREGNANCY: "Is there any chance you are pregnant?" "When was your last menstrual period?"       N/a  Protocols used: Rectal Bleeding-A-AH

## 2024-02-25 ENCOUNTER — Ambulatory Visit (INDEPENDENT_AMBULATORY_CARE_PROVIDER_SITE_OTHER): Admitting: Family Medicine

## 2024-02-25 ENCOUNTER — Encounter: Payer: Self-pay | Admitting: Family Medicine

## 2024-02-25 VITALS — BP 150/80 | HR 70 | Ht 64.0 in | Wt 196.0 lb

## 2024-02-25 DIAGNOSIS — K625 Hemorrhage of anus and rectum: Secondary | ICD-10-CM

## 2024-02-25 LAB — METHYLMALONIC ACID, SERUM: Methylmalonic Acid, Quantitative: 199 nmol/L (ref 0–378)

## 2024-02-25 NOTE — Progress Notes (Signed)
 Established Patient Office Visit  Subjective:  Patient ID: Jillian [name], female    DOB: Aug 28, 1941  Age: 83 y.o. MRN: [id number]  CC:  Chief Complaint  Patient presents with   Vaginal Bleeding    HPI Jillian [name] is a 83 y.o. female presents with complaints of rectal bleeding. The first occurrence was on 02/05/2024 and the second on 02/24/2024. She reports bright red bleeding with bowel movements but denies any associated pain or history of constipation. 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   [facility]ospitalized at [facility]   Hypertension 2005   Mild obesity    Postmenopausal    Rectal bleeding    Viral meningitis    Wears glasses     Past Surgical History:  Procedure Laterality Date   ABDOMINAL HYSTERECTOMY  1992   fibroids   APPENDECTOMY     BREAST EXCISIONAL BIOPSY Right    benign   BREAST LUMPECTOMY WITH RADIOACTIVE SEED LOCALIZATION Right 10/05/2014   Procedure: BREAST LUMPECTOMY WITH RADIOACTIVE SEED LOCALIZATION;  Surgeon: Sim Dryer, MD;  Location: [facility];  Service: General;  Laterality: Right;   cataract Bilateral    COLONOSCOPY  2013   Dr. Nolene Baumgarten :  internal hemorrhoids, mild left-sided diverticulosis   COLONOSCOPY N/A 09/21/2019   Procedure: COLONOSCOPY;  Surgeon: Alyce Jubilee, MD;  [facility]: [facility];  Service: Endoscopy;  Laterality: N/A;  12:00pm   CYST EXCISION Right    arm   ESOPHAGOGASTRODUODENOSCOPY  2013   Dr. Nolene Baumgarten: gastritis likely NSAID-induced. Duodenal polyp. Duodenitis and gastritis in setting of aspirin  and Ibuprofen .    EYE SURGERY Right 06/07/2013   cataract   EYE SURGERY Left 09/14/2013   cataract   KNEE  ARTHROSCOPY Right 02/26/2016   Procedure: ARTHROSCOPY RIGHT KNEE WITH MENSICAL DEBRIDEMENT;  Surgeon: Liliane Rei, MD;  Location: [facility];  Service: Orthopedics;  Laterality: Right;   left breast biopsy     LESION REMOVAL Right 03/30/2013   Procedure: EXCISION OF NEOPLASM ARM;  Surgeon: Beau Bound, MD;  Location: [facility];  Service: General;  Laterality: Right;   TUBAL LIGATION      Family History  Problem Relation Age of Onset   Hypertension Father    Congestive Heart Failure Father    Liver cancer Mother    Hypertension Sister    Hypertension Brother    Diabetes Brother    Colon cancer Neg Hx    Colon polyps Neg Hx     Social History   Socioeconomic History   Marital status: Widowed    Spouse name: Not on file   Number of children: Not on file   Years of education: Not on file   Highest education level: Not on file  Occupational History   Not on file  Tobacco Use   Smoking status: Never   Smokeless tobacco: Never  Vaping Use   Vaping status: Never Used  Substance and Sexual Activity   Alcohol use: No   Drug use: No   Sexual activity: Not Currently    Birth control/protection: Surgical  Other Topics Concern   Not on file  Social History Narrative   Not on file   Social Drivers of Health   Financial Resource Strain: Low Risk  (03/09/2023)   Overall Financial Resource Strain (CARDIA)    Difficulty of Paying Living Expenses: Not hard at all  Food Insecurity: No Food Insecurity (03/09/2023)   Hunger Vital Sign    Worried About Running Out of Food in the Last Year: Never true    Ran Out of Food in the Last Year: Never true  Transportation Needs: No Transportation Needs (03/09/2023)   PRAPARE - Administrator, Civil Service (Medical): No    Lack of Transportation (Non-Medical): No  Physical Activity: Insufficiently Active (03/09/2023)   Exercise Vital Sign    Days of Exercise per Week: 3 days    Minutes of Exercise per Session: 30 min  Stress: No Stress  Concern Present (03/09/2023)   [employer] of Occupational Health - Occupational Stress Questionnaire    Feeling of Stress : Not at all  Social Connections: Moderately Isolated (03/09/2023)   Social Connection and Isolation Panel [NHANES]    Frequency of Communication with Friends and Family: More than three times a week    Frequency of Social Gatherings with Friends and Family: More than three times a week    Attends Religious Services: More than 4 times per year    Active Member of [employer] or Organizations: No    Attends Banker Meetings: Never    Marital Status: Widowed  Intimate Partner Violence: Not At Risk (03/09/2023)   Humiliation, Afraid, Rape, and Kick questionnaire    Fear of Current or Ex-Partner: No    Emotionally Abused: No    Physically Abused: No    Sexually Abused: No    Outpatient Medications Prior to Visit  Medication Sig Dispense Refill   acetaminophen  (TYLENOL ) 500 MG tablet Take 1,000 mg by mouth every 6 (six) hours as needed for mild pain.     aspirin  EC 81 MG tablet Take 1 tablet (81 mg total) by mouth every evening. (Patient taking differently: Take 81 mg by mouth at bedtime.) 90 tablet 1   benazepril  (LOTENSIN ) 5 MG tablet TAKE 1 TABLET(5 MG) BY MOUTH DAILY 90 tablet 1   Blood Glucose Monitoring Suppl (TRUE METRIX METER) w/Device KIT USE AS DIRECTED 1 kit 0   calcium carbonate (OS-CAL - DOSED IN MG OF ELEMENTAL CALCIUM) 1250 (500 Ca) MG tablet Take 1 tablet by mouth.     cholecalciferol (VITAMIN D3) 25 MCG (1000 UNIT) tablet Take 2 tablets (2,000 Units total) by mouth daily. 180 tablet 3   cloNIDine  (CATAPRES ) 0.1 MG tablet Take 1 tablet (0.1 mg total) by mouth daily.     cyanocobalamin  (V-R VITAMIN B-12) 500 MCG tablet Take 1 tablet (500 mcg total) by mouth every other day. 100 tablet 1   cyclobenzaprine  (FLEXERIL ) 5 MG tablet Take 1 tablet (5 mg total) by mouth 3 (three) times daily as needed for muscle spasms. 30 tablet 1   glipiZIDE  (GLUCOTROL   XL) 2.5 MG 24 hr tablet TAKE 1 TABLET(2.5 MG) BY MOUTH DAILY WITH BREAKFAST 30 tablet 2   hydrocortisone  (ANUSOL -HC) 2.5 % rectal cream Place 1 Application rectally 2 (two) times daily. 80 g 0   hydrocortisone  (ANUSOL -HC) 25 MG suppository Place 1 suppository (25 mg total) rectally 2 (two) times daily as needed for hemorrhoids or anal itching. 20 suppository 0   pravastatin  (PRAVACHOL ) 10 MG tablet Take 1 tablet (10 mg total) by mouth daily. 100 tablet 2  TRUE METRIX BLOOD GLUCOSE TEST test strip TEST ONE TIME DAILY 100 strip 2   No facility-administered medications prior to visit.    Allergies  Allergen Reactions   Ciprofloxacin  Itching   Metronidazole  Itching   Codeine Nausea And Vomiting   Propoxyphene    Propoxyphene N-Acetaminophen  Other (See Comments)    hallucinations    ROS Review of Systems  Constitutional:  Negative for chills and fever.  Eyes:  Negative for visual disturbance.  Respiratory:  Negative for chest tightness and shortness of breath.   Genitourinary:        Rectal bleeding  Neurological:  Negative for dizziness and headaches.      Objective:     Physical Exam Vitals reviewed: no hemmorids noted externally.  HENT:     Head: Normocephalic.     Mouth/Throat:     Mouth: Mucous membranes are moist.  Cardiovascular:     Rate and Rhythm: Normal rate.     Heart sounds: Normal heart sounds.  Pulmonary:     Effort: Pulmonary effort is normal.     Breath sounds: Normal breath sounds.  Neurological:     Mental Status: She is alert.     BP (!) 150/80   Pulse 70   Ht 5\' 4"  (1.626 m)   Wt 196 lb (88.9 kg)   SpO2 92%   BMI 33.64 kg/m  Wt Readings from Last 3 Encounters:  02/25/24 196 lb (88.9 kg)  01/28/24 194 lb 1.9 oz (88.1 kg)  12/08/23 196 lb (88.9 kg)    Lab Results  Component Value Date   TSH 1.270 01/28/2024   Lab Results  Component Value Date   WBC 5.0 02/22/2024   HGB 12.5 02/22/2024   HCT 39.7 02/22/2024   MCV 95.9 02/22/2024    PLT 160 02/22/2024   Lab Results  Component Value Date   NA 143 02/22/2024   K 4.0 02/22/2024   CO2 25 02/22/2024   GLUCOSE 141 (H) 02/22/2024   BUN 11 02/22/2024   CREATININE 1.07 (H) 02/22/2024   BILITOT 0.4 02/22/2024   ALKPHOS 66 02/22/2024   AST 18 02/22/2024   ALT 13 02/22/2024   PROT 6.9 02/22/2024   ALBUMIN  3.5 02/22/2024   CALCIUM 8.8 (L) 02/22/2024   ANIONGAP 8 02/22/2024   EGFR 55 (L) 01/28/2024   Lab Results  Component Value Date   CHOL 133 01/28/2024   Lab Results  Component Value Date   HDL 42 01/28/2024   Lab Results  Component Value Date   LDLCALC 73 01/28/2024   Lab Results  Component Value Date   TRIG 95 01/28/2024   Lab Results  Component Value Date   CHOLHDL 3.2 01/28/2024   Lab Results  Component Value Date   HGBA1C 7.0 (H) 01/28/2024      Assessment & Plan:  Bright red rectal bleeding Assessment & Plan: Referral placed to GI The patient reports symptoms including dizziness or lightheadedness, which may indicate blood loss. Additional symptoms include pallor, fatigue, shortness of breath, tachycardia, hypotension, severe abdominal pain, and bleeding that is persistent or worsening.     Orders: -     Ambulatory referral to Gastroenterology  Note: This chart has been completed using Dragon Medical Dictation software, and while attempts have been made to ensure accuracy, certain words and phrases may not be transcribed as intended.    Follow-up: No follow-ups on file.   Jamiracle [employer]ants, FNP

## 2024-02-25 NOTE — Patient Instructions (Addendum)
 I appreciate the opportunity to provide care to you today!   Please monitor for Red Flag Signs -Dizziness or lightheadedness (may indicate blood loss) -Pallor, fatigue, or shortness of breath -Tachycardia or hypotension -Severe abdominal pain -Bleeding that does not stop or worsens   Referrals today-  GI    Please continue to a heart-healthy diet and increase your physical activities. Try to exercise for at least five days a week.    It was a pleasure to see you and I look forward to continuing to work together on your health and well-being. Please do not hesitate to call the office if you need care or have questions about your care.  In case of emergency, please visit the Emergency Department for urgent care, or contact our clinic at [phone] to schedule an appointment. We're here to help you!   Have a wonderful day and week. With Gratitude, Noura Purpura MSN, FNP-BC

## 2024-02-27 DIAGNOSIS — K625 Hemorrhage of anus and rectum: Secondary | ICD-10-CM | POA: Insufficient documentation

## 2024-02-27 NOTE — Assessment & Plan Note (Signed)
 Referral placed to GI The patient reports symptoms including dizziness or lightheadedness, which may indicate blood loss. Additional symptoms include pallor, fatigue, shortness of breath, tachycardia, hypotension, severe abdominal pain, and bleeding that is persistent or worsening.

## 2024-02-27 NOTE — Progress Notes (Unsigned)
 [facility] 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, a 83 y.o. 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 Jillian [name] was last seen on 02/25/2023 by Jillian Dines PA-C.  At today's visit, she  reports feeling well.  She denies any recent hospitalizations, surgeries, or changes in her  baseline health status.  She denies any new breast lumps or axillary lymphadenopathy.   She does not have any other symptoms of recurrence such as new onset bone pain, chest pain, dyspnea, or abdominal pain. She has no new headaches, seizures, or focal neurologic deficits. No B symptoms such as fever, chills, night sweats, unintentional weight loss. She has intermittent rectal bleeding from hemorrhoids, being managed by PCP (referred to GI), with recent ED visit for the same on 02/05/2024. She is taking her Vitamin B12 500 mcg every other day and Vitamin D  2000 units daily. She denies any fatigue or ice. She reports 75% energy and 100% appetite.  She is maintaining stable weight at this time.  ASSESSMENT & PLAN:  1.  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 Afton [name] on 10/05/2014,.  (She had been underoing diagnositic mammography for ongoing follow-up of a 1.2 x 1.4 x 0.9 cm hypoechoic lobular mass in the R breast at the 2:30 position 6 cm from the nipple.) - She was treated with tamoxifen  x 5 years for chemoprevention (2016 to 2021)  - Her last mammogram was 02/22/2024 which was BI-RADS category 1 negative.   - Physical examination today (02/29/24) shows right breast lower inner  quadrant scar with no palpable masses, no palpable adenopathy. - Labs done on 02/22/2024 show normal CBC.  CMP with normal LFTs, and baseline CKD stage IIIa/b (creatinine 1.07/GFR 52). - PLAN: Continue annual follow-up with labs, mammogram, physical exam.  We will plan on tentative discharge to PCP at her next visit in 2026 (10-year survivorship)  2.  Vitamin B12 deficiency - She previously took vitamin B12- stopped about a year ago - Most recent labs (02/22/2024) show normal vitamin B12 407, normal MMA. - PLAN: Continue vitamin B12 500 mcg MWF  3.  Vitamin D  deficiency - She is taking vitamin D  2000 units daily. - Most recent labs (02/22/2024) show normal vitamin D  42.23 - PLAN: Continue vitamin D    4.  History of iron deficiency - She has intermittent rectal bleeding from hemorrhoids, being managed by PCP - Most recent labs (02/22/2024): Normal Hgb 12.5/MCV 95.9, ferritin 48, iron saturation 24% - PLAN: Management of rectal bleeding as per PCP (has been referred to GI due to rectal bleeding) - Recommend oral iron supplement MWF   PLAN SUMMARY:  >> Mammogram due 02/22/2025 >> Labs in 1 year = CBC/D, CMP, vitamin D , B12, MMA, ferritin, iron/TIBC >> OFFICE visit in 1 year (1 week after labs/mammogram)    REVIEW OF SYSTEMS: No complaints at today's visit  Review of Systems  Constitutional:  Negative for appetite change, chills, diaphoresis, fatigue, fever and unexpected weight change.  HENT:   Negative for lump/mass and nosebleeds.   Eyes:  Negative for eye problems.  Respiratory:  Negative for cough, hemoptysis and shortness  of breath.   Cardiovascular:  Negative for chest pain, leg swelling and palpitations.  Gastrointestinal:  Negative for abdominal pain, blood in stool, constipation, diarrhea, nausea and vomiting.  Genitourinary:  Negative for hematuria.   Skin: Negative.   Neurological:  Negative for dizziness, headaches and light-headedness.  Hematological:  Does not bruise/bleed  easily.    PHYSICAL EXAM:   Performance status (ECOG): 0 - Asymptomatic  Vitals:   02/29/24 0949  BP: 135/61  Pulse: 71  Resp: 20  Temp: 97.9 F (36.6 C)  SpO2: 100%   Wt Readings from Last 3 Encounters:  02/29/24 195 lb 12.3 oz (88.8 kg)  02/25/24 196 lb (88.9 kg)  01/28/24 194 lb 1.9 oz (88.1 kg)   Physical Exam Constitutional:      Appearance: Normal appearance. She is obese.  Cardiovascular:     Heart sounds: Normal heart sounds.  Pulmonary:     Breath sounds: Normal breath sounds.  Chest:       Comments: No discrete nodules or masses palpated within either breast. Lymphadenopathy:     Upper Body:     Right upper body: No supraclavicular, axillary or pectoral adenopathy.     Left upper body: No supraclavicular, axillary or pectoral adenopathy.  Neurological:     General: No focal deficit present.     Mental Status: Mental status is at baseline.  Psychiatric:        Behavior: Behavior normal. Behavior is cooperative.      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   [facility]   Hypertension 2005   Mild obesity    Postmenopausal    Rectal bleeding    Viral meningitis    Wears glasses    Past Surgical History:  Procedure Laterality Date   ABDOMINAL HYSTERECTOMY  1992   fibroids   APPENDECTOMY     BREAST EXCISIONAL BIOPSY Right    benign   BREAST LUMPECTOMY WITH RADIOACTIVE SEED LOCALIZATION Right 10/05/2014   Procedure: BREAST LUMPECTOMY WITH RADIOACTIVE SEED LOCALIZATION;  Surgeon: Sim Dryer, MD;  Location: [facility];  Service: General;  Laterality: Right;   cataract Bilateral    COLONOSCOPY  2013   Dr. Nolene [name] :  internal hemorrhoids, mild left-sided diverticulosis    COLONOSCOPY N/A 09/21/2019   Procedure: COLONOSCOPY;  Surgeon: Jillian Jubilee, MD;  Location: [facility];  Service: Endoscopy;  Laterality: N/A;  12:00pm   CYST EXCISION Right    arm   ESOPHAGOGASTRODUODENOSCOPY  2013   Dr. Nolene [name]: gastritis likely NSAID-induced. Duodenal polyp. Duodenitis and gastritis in setting of aspirin  and Ibuprofen .    EYE SURGERY Right 06/07/2013   cataract   EYE SURGERY Left 09/14/2013   cataract   KNEE ARTHROSCOPY Right 02/26/2016   Procedure: ARTHROSCOPY RIGHT KNEE WITH MENSICAL DEBRIDEMENT;  Surgeon: Liliane Rei, MD;  Location: [facility];  Service: Orthopedics;  Laterality: Right;   left breast biopsy     LESION REMOVAL Right 03/30/2013   Procedure: EXCISION OF NEOPLASM ARM;  Surgeon: Beau Bound, MD;  Location: [facility];  Service: General;  Laterality: Right;   TUBAL LIGATION      SOCIAL HISTORY:  Social History   Socioeconomic  History   Marital status: Widowed    Spouse name: Not on file   Number of children: Not on file   Years of education: Not on file   Highest education level: Not on file  Occupational History   Not on file  Tobacco Use   Smoking status: Never   Smokeless tobacco: Never  Vaping Use   Vaping status: Never Used  Substance and Sexual Activity   Alcohol use: No   Drug use: No   Sexual activity: Not Currently    Birth control/protection: Surgical  Other Topics Concern   Not on file  Social History Narrative   Not on file   Social Drivers of Health   Financial Resource Strain: Low Risk  (03/09/2023)   Overall Financial Resource Strain (CARDIA)    Difficulty of Paying Living Expenses: Not hard at all  Food Insecurity: No Food Insecurity (03/09/2023)   Hunger Vital Sign    Worried About Running Out of Food in the Last Year: Never true    Ran Out of Food in the Last Year: Never true  Transportation Needs: No Transportation Needs (03/09/2023)   PRAPARE - Administrator, Civil Service (Medical): No    Lack of  Transportation (Non-Medical): No  Physical Activity: Insufficiently Active (03/09/2023)   Exercise Vital Sign    Days of Exercise per Week: 3 days    Minutes of Exercise per Session: 30 min  Stress: No Stress Concern Present (03/09/2023)   [employer] of Occupational Health - Occupational Stress Questionnaire    Feeling of Stress : Not at all  Social Connections: Moderately Isolated (03/09/2023)   Social Connection and Isolation Panel [NHANES]    Frequency of Communication with Friends and Family: More than three times a week    Frequency of Social Gatherings with Friends and Family: More than three times a week    Attends Religious Services: More than 4 times per year    Active Member of [employer] or Organizations: No    Attends Banker Meetings: Never    Marital Status: Widowed  Intimate Partner Violence: Not At Risk (03/09/2023)   Humiliation, Afraid, Rape, and Kick questionnaire    Fear of Current or Ex-Partner: No    Emotionally Abused: No    Physically Abused: No    Sexually Abused: No    FAMILY HISTORY:  Family History  Problem Relation Age of Onset   Hypertension Father    Congestive Heart Failure Father    Liver cancer Mother    Hypertension Sister    Hypertension Brother    Diabetes Brother    Colon cancer Neg Hx    Colon polyps Neg Hx     CURRENT MEDICATIONS:  Current Outpatient Medications  Medication Sig Dispense Refill   acetaminophen  (TYLENOL ) 500 MG tablet Take 1,000 mg by mouth every 6 (six) hours as needed for mild pain.     aspirin  EC 81 MG tablet Take 1 tablet (81 mg total) by mouth every evening. (Patient taking differently: Take 81 mg by mouth at bedtime.) 90 tablet 1   benazepril  (LOTENSIN ) 5 MG tablet TAKE 1 TABLET(5 MG) BY MOUTH DAILY 90 tablet 1   Blood Glucose Monitoring Suppl (TRUE METRIX METER) w/Device KIT USE AS DIRECTED 1 kit 0   calcium carbonate (OS-CAL - DOSED IN MG OF ELEMENTAL CALCIUM) 1250 (500 Ca) MG tablet Take 1 tablet by  mouth.     cholecalciferol (VITAMIN D3) 25 MCG (1000 UNIT) tablet Take  2 tablets (2,000 Units total) by mouth daily. 180 tablet 3   cloNIDine  (CATAPRES ) 0.1 MG tablet Take 1 tablet (0.1 mg total) by mouth daily.     cyanocobalamin  (V-R VITAMIN B-12) 500 MCG tablet Take 1 tablet (500 mcg total) by mouth every other day. 100 tablet 1   cyclobenzaprine  (FLEXERIL ) 5 MG tablet Take 1 tablet (5 mg total) by mouth 3 (three) times daily as needed for muscle spasms. 30 tablet 1   ferrous sulfate  325 (65 FE) MG EC tablet Take 1 tablet (325 mg total) by mouth every Monday, Wednesday, and Friday. 150 tablet 0   glipiZIDE  (GLUCOTROL  XL) 2.5 MG 24 hr tablet TAKE 1 TABLET(2.5 MG) BY MOUTH DAILY WITH BREAKFAST 30 tablet 2   hydrocortisone  (ANUSOL -HC) 2.5 % rectal cream Place 1 Application rectally 2 (two) times daily. 80 g 0   hydrocortisone  (ANUSOL -HC) 25 MG suppository Place 1 suppository (25 mg total) rectally 2 (two) times daily as needed for hemorrhoids or anal itching. 20 suppository 0   pravastatin  (PRAVACHOL ) 10 MG tablet Take 1 tablet (10 mg total) by mouth daily. 100 tablet 2   TRUE METRIX BLOOD GLUCOSE TEST test strip TEST ONE TIME DAILY 100 strip 2   No current facility-administered medications for this visit.    ALLERGIES:  Allergies  Allergen Reactions   Ciprofloxacin  Itching   Metronidazole  Itching   Codeine Nausea And Vomiting   Propoxyphene    Propoxyphene N-Acetaminophen  Other (See Comments)    hallucinations    LABORATORY DATA:  I have reviewed the labs as listed.     Latest Ref Rng & Units 02/22/2024    8:54 AM 02/05/2024    4:08 PM 01/28/2024   11:15 AM  CBC  WBC 4.0 - 10.5 K/uL 5.0  7.4  4.3   Hemoglobin 12.0 - 15.0 g/dL 40.9  81.1  91.4   Hematocrit 36.0 - 46.0 % 39.7  39.1  41.5   Platelets 150 - 400 K/uL 160  174  156       Latest Ref Rng & Units 02/22/2024    8:54 AM 01/28/2024   11:15 AM 08/28/2023   11:25 AM  CMP  Glucose 70 - 99 mg/dL 782  75  956   BUN 8 - 23 mg/dL  11  11  9    Creatinine 0.44 - 1.00 mg/dL 2.13  0.86  5.78   Sodium 135 - 145 mmol/L 143  143  142   Potassium 3.5 - 5.1 mmol/L 4.0  4.4  4.4   Chloride 98 - 111 mmol/L 110  107  107   CO2 22 - 32 mmol/L 25  22  22    Calcium 8.9 - 10.3 mg/dL 8.8  9.4  9.3   Total Protein 6.5 - 8.1 g/dL 6.9  6.9    Total Bilirubin 0.0 - 1.2 mg/dL 0.4  0.3    Alkaline Phos 38 - 126 U/L 66  87    AST 15 - 41 U/L 18  16    ALT 0 - 44 U/L 13  10      DIAGNOSTIC IMAGING:  I have independently reviewed the scans and discussed with the patient. MM 3D SCREENING MAMMOGRAM BILATERAL BREAST Result Date: 02/25/2024 CLINICAL DATA:  Screening. EXAM: DIGITAL SCREENING BILATERAL MAMMOGRAM WITH TOMOSYNTHESIS AND CAD TECHNIQUE: Bilateral screening digital craniocaudal and mediolateral oblique mammograms were obtained. Bilateral screening digital breast tomosynthesis was performed. The images were evaluated with computer-aided detection. COMPARISON:  Previous exam(s). ACR Breast Density Category  a: The breasts are almost entirely fatty. FINDINGS: There are no findings suspicious for malignancy. IMPRESSION: No mammographic evidence of malignancy. A result letter of this screening mammogram will be mailed directly to the patient. RECOMMENDATION: Screening mammogram in one year. (Code:[id number]-B-01Y) BI-RADS CATEGORY  1: Negative. Electronically Signed   By: Amanda Jungling M.D.   On: 02/25/2024 12:50     WRAP UP:  All questions were answered. The patient knows to call the clinic with any problems, questions or concerns.  Medical decision making: Moderate  Time spent on visit: I spent 20 minutes counseling the patient face to face. The total time spent in the appointment was 30 minutes and more than 50% was on counseling.  Sonnie Dusky, PA-C  02/29/24 10:23 AM

## 2024-02-29 ENCOUNTER — Other Ambulatory Visit (HOSPITAL_COMMUNITY): Payer: Self-pay | Admitting: Family Medicine

## 2024-02-29 ENCOUNTER — Inpatient Hospital Stay: Payer: Medicare Other | Admitting: Physician Assistant

## 2024-02-29 VITALS — BP 135/61 | HR 71 | Temp 97.9°F | Resp 20 | Wt 195.8 lb

## 2024-02-29 DIAGNOSIS — Z800 Family history of malignant neoplasm of digestive organs: Secondary | ICD-10-CM | POA: Diagnosis not present

## 2024-02-29 DIAGNOSIS — E611 Iron deficiency: Secondary | ICD-10-CM

## 2024-02-29 DIAGNOSIS — E559 Vitamin D deficiency, unspecified: Secondary | ICD-10-CM | POA: Diagnosis not present

## 2024-02-29 DIAGNOSIS — E538 Deficiency of other specified B group vitamins: Secondary | ICD-10-CM

## 2024-02-29 DIAGNOSIS — Z1231 Encounter for screening mammogram for malignant neoplasm of breast: Secondary | ICD-10-CM

## 2024-02-29 DIAGNOSIS — D241 Benign neoplasm of right breast: Secondary | ICD-10-CM

## 2024-02-29 DIAGNOSIS — N6091 Unspecified benign mammary dysplasia of right breast: Secondary | ICD-10-CM | POA: Diagnosis not present

## 2024-02-29 MED ORDER — FERROUS SULFATE 325 (65 FE) MG PO TBEC: 325.0000 mg | DELAYED_RELEASE_TABLET | ORAL | 0 refills | Status: AC

## 2024-02-29 NOTE — Patient Instructions (Signed)
 [facility] at [facility] **VISIT SUMMARY & IMPORTANT INSTRUCTIONS **   You were seen today by Sheril Dines PA-C for your follow-up visit.    HISTORY OF RIGHT BREAST PAPILLOMA & ATYPICAL DUCTAL HYPERPLASIA As we discussed, you were diagnosed with "precancerous" findings in 2016, and have completed treatment with tamoxifen . Your most recent labs, physical exam, and mammogram did not show any sign of breast cancer. We will see you in 1 year for labs, mammogram, and office visit.  VITAMIN DEFICIENCIES Continue taking vitamin D  supplement. Continue vitamin B12 (500 mcg) on Monday, Wednesday, and Friday. START taking iron tablet (ferrous sulfate  325 mg) on Monday, Wednesday, and Friday.  ** Thank you for trusting me with your healthcare!  I strive to provide all of my patients with quality care at each visit.  If you receive a survey for this visit, I would be so grateful to you for taking the time to provide feedback.  Thank you in advance!  ~ Daron Stutz                   Dr. Paulett Boros   &   Sheril Dines, PA-C   - - - - - - - - - - - - - - - - - -    Thank you for choosing [facility] at [facility] to provide your oncology and hematology care.  To afford each patient quality time with our provider, please arrive at least 15 minutes before your scheduled appointment time.   If you have a lab appointment with the Cancer Center please come in thru the Main Entrance and check in at the main information desk.  You need to re-schedule your appointment should you arrive 10 or more minutes late.  We strive to give you quality time with our providers, and arriving late affects you and other patients whose appointments are after yours.  Also, if you no show three or more times for appointments you may be dismissed from the clinic at the providers discretion.     Again, thank you for choosing [facility].  Our hope is that  these requests will decrease the amount of time that you wait before being seen by our physicians.       _____________________________________________________________  Should you have questions after your visit to [facility], please contact our office at [phone] and follow the prompts.  Our office hours are 8:00 a.m. and 4:30 p.m. Monday - Friday.  Please note that voicemails left after 4:00 p.m. may not be returned until the following business day.  We are closed weekends and major holidays.  You do have access to a nurse 24-7, just call the main number to the clinic [phone] and do not press any options, hold on the line and a nurse will answer the phone.    For prescription refill requests, have your pharmacy contact our office and allow 72 hours.

## 2024-03-02 ENCOUNTER — Encounter: Payer: Self-pay | Admitting: Internal Medicine

## 2024-03-02 ENCOUNTER — Ambulatory Visit: Admitting: Internal Medicine

## 2024-03-02 VITALS — BP 137/60 | HR 60 | Temp 97.8°F | Ht 64.0 in | Wt 195.1 lb

## 2024-03-02 DIAGNOSIS — K644 Residual hemorrhoidal skin tags: Secondary | ICD-10-CM

## 2024-03-02 DIAGNOSIS — K5730 Diverticulosis of large intestine without perforation or abscess without bleeding: Secondary | ICD-10-CM | POA: Diagnosis not present

## 2024-03-02 DIAGNOSIS — D508 Other iron deficiency anemias: Secondary | ICD-10-CM

## 2024-03-02 DIAGNOSIS — K648 Other hemorrhoids: Secondary | ICD-10-CM | POA: Diagnosis not present

## 2024-03-02 DIAGNOSIS — K625 Hemorrhage of anus and rectum: Secondary | ICD-10-CM

## 2024-03-02 NOTE — Patient Instructions (Signed)
 We will continue to monitor for now.  If you have resurgence of your bleeding then let me know and we can set you up for colonoscopy.  Otherwise follow-up as needed.  It was very nice meeting both you today.  Dr. Mordechai April

## 2024-03-02 NOTE — Progress Notes (Signed)
 Primary Care Physician:  Towanda Fret, MD Primary Gastroenterologist:  Dr. Mordechai April  Chief Complaint  Patient presents with   Rectal Bleeding    Patient here today due to having issues with rectal bleeding. She has seen bright red blood in the toilet three times now. No history of hemorrhoid. Denies any rectal pain or itching.     HPI:   Jillian [name] is a 83 y.o. female who presents to the clinic today by referral from her PCP Gloria Zarwolo for evaluation.  She has a history of previous episodes of acute diverticulitis, diabetes, dyslipidemia, hypertension, obesity.  Rectal bleeding: Reports 3 episodes of bright red blood per rectum.  Notes blood in the toilet bowl itself.  No rectal pain or discomfort.  No episodes in over 1 month.  Colonoscopy: 09/21/2019 - diverticulosis in sigmoid and descending colon, internal and external hemorrhoids, tortuous colon.   Denies any family history of colorectal malignancy.  History of iron deficiency anemia with most recent labs 6/2/5 hemoglobin 12.5, ferritin 48, iron saturation 24%.    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  Nolene Baumgarten :  internal hemorrhoids, mild left-sided diverticulosis   COLONOSCOPY N/A 09/21/2019   Procedure: COLONOSCOPY;  Surgeon: Alyce Jubilee, MD;  Location: [facility];  Service: Endoscopy;  Laterality: N/A;  12:00pm   CYST EXCISION Right    arm   ESOPHAGOGASTRODUODENOSCOPY  2013   Dr. Nolene Baumgarten: gastritis likely NSAID-induced. Duodenal polyp. Duodenitis and gastritis in setting of aspirin  and Ibuprofen .    EYE SURGERY Right 06/07/2013   cataract   EYE SURGERY Left 09/14/2013   cataract   KNEE ARTHROSCOPY Right 02/26/2016   Procedure: ARTHROSCOPY RIGHT KNEE WITH MENSICAL DEBRIDEMENT;  Surgeon: Liliane Rei, MD;  Location: [facility];  Service: Orthopedics;  Laterality: Right;   left breast biopsy     LESION REMOVAL Right 03/30/2013   Procedure: EXCISION OF NEOPLASM ARM;  Surgeon: Beau Bound, MD;  Location: [facility];  Service: General;  Laterality: Right;   TUBAL LIGATION      Current Outpatient Medications  Medication Sig Dispense Refill   acetaminophen  (TYLENOL ) 500 MG tablet Take 1,000 mg by mouth every 6 (six) hours as needed for mild pain.     aspirin  EC 81 MG tablet Take 1 tablet (81 mg total) by mouth every evening. (Patient taking differently: Take 81 mg by mouth at bedtime.) 90 tablet 1   benazepril  (LOTENSIN ) 5 MG tablet TAKE 1 TABLET(5 MG) BY MOUTH DAILY 90 tablet 1   Blood  Glucose Monitoring Suppl (TRUE METRIX METER) w/Device KIT USE AS DIRECTED 1 kit 0   calcium carbonate (OS-CAL - DOSED IN MG OF ELEMENTAL CALCIUM) 1250 (500 Ca) MG tablet Take 1 tablet by mouth.     cholecalciferol (VITAMIN D3) 25 MCG (1000 UNIT) tablet Take 2 tablets (2,000 Units total) by mouth daily. 180 tablet 3   cloNIDine  (CATAPRES ) 0.1 MG tablet Take 1 tablet (0.1 mg total) by mouth daily.     cyanocobalamin  (V-R VITAMIN B-12) 500 MCG tablet Take 1 tablet (500 mcg total) by mouth every other day. 100 tablet 1   cyclobenzaprine  (FLEXERIL ) 5 MG tablet Take 1 tablet (5 mg total) by mouth 3 (three) times  daily as needed for muscle spasms. 30 tablet 1   ferrous sulfate  325 (65 FE) MG EC tablet Take 1 tablet (325 mg total) by mouth every Monday, Wednesday, and Friday. 150 tablet 0   glipiZIDE  (GLUCOTROL  XL) 2.5 MG 24 hr tablet TAKE 1 TABLET(2.5 MG) BY MOUTH DAILY WITH BREAKFAST 30 tablet 2   hydrocortisone  (ANUSOL -HC) 2.5 % rectal cream Place 1 Application rectally 2 (two) times daily. 80 g 0   pravastatin  (PRAVACHOL ) 10 MG tablet Take 1 tablet (10 mg total) by mouth daily. 100 tablet 2   TRUE METRIX BLOOD GLUCOSE TEST test strip TEST ONE TIME DAILY 100 strip 2   hydrocortisone  (ANUSOL -HC) 25 MG suppository Place 1 suppository (25 mg total) rectally 2 (two) times daily as needed for hemorrhoids or anal itching. (Patient not taking: Reported on 03/02/2024) 20 suppository 0   No current facility-administered medications for this visit.    Allergies as of 03/02/2024 - Review Complete 03/02/2024  Allergen Reaction Noted   Ciprofloxacin  Itching 07/12/2020   Metronidazole  Itching 07/12/2020   Codeine Nausea And Vomiting 12/22/2011   Propoxyphene  01/29/2021   Propoxyphene n-acetaminophen  Other (See Comments) 08/10/2008    Family History  Problem Relation Age of Onset   Hypertension Father    Congestive Heart Failure Father    Liver cancer Mother    Hypertension Sister    Hypertension Brother    Diabetes Brother    Colon cancer Neg Hx    Colon polyps Neg Hx     Social History   Socioeconomic History   Marital status: Widowed    Spouse name: Not on file   Number of children: Not on file   Years of education: Not on file   Highest education level: Not on file  Occupational History   Not on file  Tobacco Use   Smoking status: Never   Smokeless tobacco: Never  Vaping Use   Vaping status: Never Used  Substance and Sexual Activity   Alcohol use: No   Drug use: No   Sexual activity: Not Currently    Birth control/protection: Surgical  Other Topics Concern   Not on file  Social  History Narrative   Not on file   Social Drivers of Health   Financial Resource Strain: Low Risk  (03/09/2023)   Overall Financial Resource Strain (CARDIA)    Difficulty of Paying Living Expenses: Not hard at all  Food Insecurity: No Food Insecurity (03/09/2023)   Hunger Vital Sign    Worried About Running Out of Food in the Last Year: Never true    Ran Out of Food in the Last Year: Never true  Transportation Needs: No Transportation Needs (03/09/2023)   PRAPARE - Administrator, Civil Service (Medical): No    Lack of Transportation (Non-Medical):  No  Physical Activity: Insufficiently Active (03/09/2023)   Exercise Vital Sign    Days of Exercise per Week: 3 days    Minutes of Exercise per Session: 30 min  Stress: No Stress Concern Present (03/09/2023)   [employer] of Occupational Health - Occupational Stress Questionnaire    Feeling of Stress : Not at all  Social Connections: Moderately Isolated (03/09/2023)   Social Connection and Isolation Panel [NHANES]    Frequency of Communication with Friends and Family: More than three times a week    Frequency of Social Gatherings with Friends and Family: More than three times a week    Attends Religious Services: More than 4 times per year    Active Member of [employer] or Organizations: No    Attends Banker Meetings: Never    Marital Status: Widowed  Intimate Partner Violence: Not At Risk (03/09/2023)   Humiliation, Afraid, Rape, and Kick questionnaire    Fear of Current or Ex-Partner: No    Emotionally Abused: No    Physically Abused: No    Sexually Abused: No    Subjective: Review of Systems  Constitutional:  Negative for chills and fever.  HENT:  Negative for congestion and hearing loss.   Eyes:  Negative for blurred vision and double vision.  Respiratory:  Negative for cough and shortness of breath.   Cardiovascular:  Negative for chest pain and palpitations.  Gastrointestinal:  Positive for blood in  stool. Negative for abdominal pain, constipation, diarrhea, heartburn, melena and vomiting.  Genitourinary:  Negative for dysuria and urgency.  Musculoskeletal:  Negative for joint pain and myalgias.  Skin:  Negative for itching and rash.  Neurological:  Negative for dizziness and headaches.  Psychiatric/Behavioral:  Negative for depression. The patient is not nervous/anxious.        Objective: BP 137/60 (BP Location: Left Arm, Patient Position: Sitting, Cuff Size: Large)   Pulse 60   Temp 97.8 F (36.6 C) (Temporal)   Ht 5' 4 (1.626 m)   Wt 195 lb 1.6 oz (88.5 kg)   BMI 33.49 kg/m  Physical Exam Constitutional:      Appearance: Normal appearance.  HENT:     Head: Normocephalic and atraumatic.   Eyes:     Extraocular Movements: Extraocular movements intact.     Conjunctiva/sclera: Conjunctivae normal.    Cardiovascular:     Rate and Rhythm: Normal rate and regular rhythm.  Pulmonary:     Effort: Pulmonary effort is normal.     Breath sounds: Normal breath sounds.  Abdominal:     General: Bowel sounds are normal.     Palpations: Abdomen is soft.   Musculoskeletal:        General: No swelling. Normal range of motion.     Cervical back: Normal range of motion and neck supple.   Skin:    General: Skin is warm and dry.     Coloration: Skin is not jaundiced.   Neurological:     General: No focal deficit present.     Mental Status: She is alert and oriented to person, place, and time.   Psychiatric:        Mood and Affect: Mood normal.        Behavior: Behavior normal.      Assessment: *Rectal bleeding *Internal and external hemorrhoids *Diverticulosis  Plan: Discussed in depth with patient today.  To further evaluate would recommend colonoscopy though patient would like to hold off for now as her bleeding has resolved.  I did counsel her that by not performing colonoscopy we could be missing something sinister such as a malignancy and she understands.  She  states that if she has any further episodes she will call the office and we can set her up for colonoscopy.  Otherwise follow-up as needed.  Thank you Gloria Zarwolo for the kind referral  03/02/2024 3:47 PM   Disclaimer: This note was dictated with voice recognition software. Similar sounding words can inadvertently be transcribed and may not be corrected upon review.

## 2024-03-03 ENCOUNTER — Ambulatory Visit (HOSPITAL_COMMUNITY)

## 2024-03-07 ENCOUNTER — Other Ambulatory Visit: Payer: Self-pay | Admitting: Family Medicine

## 2024-03-14 ENCOUNTER — Ambulatory Visit (INDEPENDENT_AMBULATORY_CARE_PROVIDER_SITE_OTHER): Payer: Medicare Other

## 2024-03-14 ENCOUNTER — Telehealth: Payer: Self-pay

## 2024-03-14 ENCOUNTER — Telehealth (INDEPENDENT_AMBULATORY_CARE_PROVIDER_SITE_OTHER): Payer: Self-pay | Admitting: Internal Medicine

## 2024-03-14 VITALS — BP 120/63 | HR 87 | Resp 12 | Ht 64.0 in | Wt 194.1 lb

## 2024-03-14 DIAGNOSIS — Z0000 Encounter for general adult medical examination without abnormal findings: Secondary | ICD-10-CM | POA: Diagnosis not present

## 2024-03-14 DIAGNOSIS — E1169 Type 2 diabetes mellitus with other specified complication: Secondary | ICD-10-CM | POA: Diagnosis not present

## 2024-03-14 LAB — GLUCOSE, POCT (MANUAL RESULT ENTRY): POC Glucose: 136 mg/dL — AB (ref 70–99)

## 2024-03-14 NOTE — Telephone Encounter (Signed)
 eror

## 2024-03-14 NOTE — Telephone Encounter (Signed)
 FYI:  Phoned the pt and was advised by her that she only had a little bleeding Saturday morning, none since then. I asked the pt has she used her rectal cream and she replied no. I advised her to use it twice a day and she will report back to me on Thursday if she has anymore bleeding

## 2024-03-14 NOTE — Progress Notes (Signed)
 Subjective:   Jillian [name] is a 83 y.o. who presents for a Medicare Wellness preventive visit.  As a reminder, Annual Wellness Visits don't include a physical exam, and some assessments may be limited, especially if this visit is performed virtually. We may recommend an in-person follow-up visit with your provider if needed.  Visit Complete: In person  Persons Participating in Visit: Patient.  AWV Questionnaire: No: Patient Medicare AWV questionnaire was not completed prior to this visit.  Cardiac Risk Factors include: advanced age (>48men, >47 women);diabetes mellitus;dyslipidemia;hypertension;obesity (BMI >30kg/m2)     Objective:    Today's Vitals   03/14/24 1010 03/14/24 1011  BP: 120/63   Pulse: 87   Resp: 12   SpO2: 97%   Weight: 194 lb 1.3 oz (88 kg)   Height: 5' 4 (1.626 m)   PainSc:  0-No pain   Body mass index is 33.31 kg/m.     03/14/2024   10:07 AM 02/29/2024    9:48 AM 03/09/2023   11:15 AM 02/25/2023    9:49 AM 02/24/2022   10:12 AM 02/19/2022   11:48 AM 06/12/2021   10:42 AM  Advanced Directives  Does Patient Have a Medical Advance Directive? Yes Yes Yes No Yes Yes Yes  Type of Estate agent of [place];Living will Healthcare Power of [place];Living will Healthcare Power of [employer] of [place];Living will   Does patient want to make changes to medical advance directive? No - Patient declined No - Patient declined No - Patient declined  No - Patient declined  No - Patient declined  Copy of Healthcare Power of Attorney in Chart? Yes - validated most recent copy scanned in chart (See row information) No - copy requested No - copy requested  No - copy requested No - copy requested   Would patient like information on creating a medical advance directive?   No - Patient declined No - Patient declined       Current Medications (verified) Outpatient Encounter Medications as of 03/14/2024  Medication Sig   acetaminophen   (TYLENOL ) 500 MG tablet Take 1,000 mg by mouth every 6 (six) hours as needed for mild pain.   aspirin  EC 81 MG tablet Take 1 tablet (81 mg total) by mouth every evening.   benazepril  (LOTENSIN ) 5 MG tablet TAKE 1 TABLET(5 MG) BY MOUTH DAILY   Blood Glucose Monitoring Suppl (TRUE METRIX METER) w/Device KIT USE AS DIRECTED   calcium carbonate (OS-CAL - DOSED IN MG OF ELEMENTAL CALCIUM) 1250 (500 Ca) MG tablet Take 1 tablet by mouth.   cholecalciferol (VITAMIN D3) 25 MCG (1000 UNIT) tablet Take 2 tablets (2,000 Units total) by mouth daily.   cloNIDine  (CATAPRES ) 0.1 MG tablet Take 1 tablet (0.1 mg total) by mouth daily.   cyanocobalamin  (V-R VITAMIN B-12) 500 MCG tablet Take 1 tablet (500 mcg total) by mouth every other day.   cyclobenzaprine  (FLEXERIL ) 5 MG tablet Take 1 tablet (5 mg total) by mouth 3 (three) times daily as needed for muscle spasms.   ferrous sulfate  325 (65 FE) MG EC tablet Take 1 tablet (325 mg total) by mouth every Monday, Wednesday, and Friday.   glipiZIDE  (GLUCOTROL  XL) 2.5 MG 24 hr tablet TAKE 1 TABLET(2.5 MG) BY MOUTH DAILY WITH BREAKFAST   hydrocortisone  (ANUSOL -HC) 2.5 % rectal cream Place 1 Application rectally 2 (two) times daily.   hydrocortisone  (ANUSOL -HC) 25 MG suppository Place 1 suppository (25 mg total) rectally 2 (two) times daily as needed for  hemorrhoids or anal itching.   pravastatin  (PRAVACHOL ) 10 MG tablet Take 1 tablet (10 mg total) by mouth daily.   TRUE METRIX BLOOD GLUCOSE TEST test strip TEST ONE TIME DAILY   No facility-administered encounter medications on file as of 03/14/2024.    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   [facility]ospital[facility] at [facility]   Hypertension 2005   Mild obesity    Postmenopausal    Rectal bleeding    Viral meningitis    Wears glasses    Past Surgical History:  Procedure Laterality Date   ABDOMINAL HYSTERECTOMY  1992   fibroids   APPENDECTOMY     BREAST EXCISIONAL BIOPSY Right    benign   BREAST LUMPECTOMY WITH RADIOACTIVE SEED LOCALIZATION Right 10/05/2014   Procedure: BREAST LUMPECTOMY WITH RADIOACTIVE SEED LOCALIZATION;  Surgeon: Debby Shipper, MD;  Location: [facility];  Service: General;  Laterality: Right;   cataract Bilateral    COLONOSCOPY  2013   Dr. Harvey :  internal hemorrhoids, mild left-sided diverticulosis   COLONOSCOPY N/A 09/21/2019   Procedure: COLONOSCOPY;  Surgeon: Harvey Margo CROME, MD;  Location[facility];  Service: Endoscopy;  Laterality: N/A;  12:00pm   CYST EXCISION Right    arm   ESOPHAGOGASTRODUODENOSCOPY  2013   Dr. Harvey: gastritis likely NSAID-induced. Duodenal polyp. Duodenitis and gastritis in setting of aspirin  and Ibuprofen .    EYE SURGERY Right 06/07/2013   cataract   EYE SURGERY Left 09/14/2013   cataract   KNEE ARTHROSCOPY Right 02/26/2016   Procedure: ARTHROSCOPY RIGHT KNEE WITH MENSICAL DEBRIDEMENT;  Surgeon: Dempsey Moan, MD;  Location: [facility];  Service: Orthopedics;  Laterality: Right;   left breast biopsy     LESION REMOVAL Right 03/30/2013   Procedure: EXCISION OF NEOPLASM ARM;  Surgeon: Oneil DELENA Budge, MD;  Location: [facility];  Service: General;  Laterality: Right;   TUBAL LIGATION     Family History  Problem Relation Age of Onset   Hypertension Father    Congestive Heart Failure Father    Liver cancer Mother    Hypertension Sister    Hypertension Brother    Diabetes Brother    Colon cancer Neg Hx    Colon polyps Neg Hx    Social History   Socioeconomic History   Marital status: Widowed    Spouse name: Not on file   Number of children: Not on file   Years of education: Not on file    Highest education level: Not on file  Occupational History   Not on file  Tobacco Use   Smoking status: Never   Smokeless tobacco: Never  Vaping Use   Vaping status: Never Used  Substance and Sexual Activity   Alcohol use: No   Drug use: No   Sexual activity: Not Currently    Birth control/protection: Surgical  Other Topics Concern   Not on file  Social History Narrative   Not on file   Social Drivers of Health   Financial Resource Strain: Low Risk  (03/14/2024)   Overall Financial Resource Strain (CARDIA)    Difficulty of Paying Living Expenses: Not hard at  all  Food Insecurity: No Food Insecurity (03/14/2024)   Hunger Vital Sign    Worried About Running Out of Food in the Last Year: Never true    Ran Out of Food in the Last Year: Never true  Transportation Needs: No Transportation Needs (03/14/2024)   PRAPARE - Administrator, Civil Service (Medical): No    Lack of Transportation (Non-Medical): No  Physical Activity: Sufficiently Active (03/14/2024)   Exercise Vital Sign    Days of Exercise per Week: 7 days    Minutes of Exercise per Session: 30 min  Stress: No Stress Concern Present (03/14/2024)   [employer] of Occupational Health - Occupational Stress Questionnaire    Feeling of Stress: Not at all  Social Connections: Moderately Integrated (03/14/2024)   Social Connection and Isolation Panel    Frequency of Communication with Friends and Family: More than three times a week    Frequency of Social Gatherings with Friends and Family: More than three times a week    Attends Religious Services: More than 4 times per year    Active Member of [employer] or Organizations: Yes    Attends Banker Meetings: More than 4 times per year    Marital Status: Widowed    Tobacco Counseling Counseling given: Yes    Clinical Intake:  Pre-visit preparation completed: Yes  Pain : No/denies pain Pain Score: 0-No pain     BMI - recorded:  33.31 Nutritional Status: BMI > 30  Obese Nutritional Risks: None Diabetes: Yes CBG done?: Yes CBG resulted in Enter/ Edit results?: Yes Did pt. bring in CBG monitor from home?: No  Lab Results  Component Value Date   HGBA1C 7.0 (H) 01/28/2024   HGBA1C 7.0 (H) 08/28/2023   HGBA1C 7.1 (H) 05/21/2023     How often do you need to have someone help you when you read instructions, pamphlets, or other written materials from your doctor or pharmacy?: 1 - Never  Interpreter Needed?: No  Information entered by :: Stefano ORN CMA   Activities of Daily Living     03/14/2024   10:24 AM  In your present state of health, do you have any difficulty performing the following activities:  Hearing? 0  Vision? 0  Difficulty concentrating or making decisions? 0  Walking or climbing stairs? 0  Dressing or bathing? 0  Doing errands, shopping? 0  Preparing Food and eating ? N  Using the Toilet? N  In the past six months, have you accidently leaked urine? N  Do you have problems with loss of bowel control? N  Managing your Medications? N  Managing your Finances? N  Housekeeping or managing your Housekeeping? N    Patient Care Team: Antonetta Rollene BRAVO, MD as PCP - General Everlean Fallow, MD as Consulting Physician (Rheumatology) Cindie Carlin POUR, DO as Consulting Physician (Gastroenterology) Rachele Gaynell RAMAN, MD as Referring Physician (Nephrology) Patty, A. Robynn, MD as Consulting Physician (Ophthalmology) Fernande Lamar LABOR, DPM as Referring Physician (Podiatry) Melodi Lerner, MD as Consulting Physician (Orthopedic Surgery)  I have updated your Care Teams any recent Medical Services you may have received from other providers in the past year.     Assessment:   This is a routine wellness examination for Jillian [name].  Hearing/Vision screen Hearing Screening - Comments:: Patient denies any hearing difficulties.   Vision Screening - Comments:: Patient wears reading glasses only. Up to date with  yearly exams.  [facility]    Goals Addressed  This Visit's Progress    DIET - INCREASE WATER  INTAKE   On track    Exercise 3x per week (30 min per time)   On track    Recommend starting a routine exercise program at least 3 days a week for 30-45 minutes at a time as tolerated.       Patient Stated       To remain active healthy and independent       Depression Screen     03/14/2024   10:27 AM 01/28/2024   10:22 AM 12/08/2023    2:00 PM 08/28/2023   10:18 AM 05/27/2023    9:34 AM 03/12/2023    9:02 AM 03/09/2023   11:15 AM  PHQ 2/9 Scores  PHQ - 2 Score 0 0 0 0 0 3 0  PHQ- 9 Score 0     5     Fall Risk     03/14/2024   10:24 AM 01/28/2024   10:22 AM 12/08/2023    2:00 PM 08/28/2023   10:18 AM 05/27/2023    9:34 AM  Fall Risk   Falls in the past year? 0 0 0 0 0  Number falls in past yr: 0 0 0 0 0  Injury with Fall? 0 0 0 0 0  Risk for fall due to : No Fall Risks  No Fall Risks  No Fall Risks  Follow up Falls evaluation completed;Education provided;Falls prevention discussed Falls evaluation completed Falls evaluation completed  Falls evaluation completed    MEDICARE RISK AT HOME:  Medicare Risk at Home Any stairs in or around the home?: No If so, are there any without handrails?: No Home free of loose throw rugs in walkways, pet beds, electrical cords, etc?: Yes Adequate lighting in your home to reduce risk of falls?: Yes Life alert?: No Use of a cane, walker or w/c?: No Grab bars in the bathroom?: Yes Shower chair or bench in shower?: No Elevated toilet seat or a handicapped toilet?: Yes  TIMED UP AND GO:  Was the test performed?  Yes  Length of time to ambulate 10 feet: 5 sec Gait steady and fast without use of assistive device  Cognitive Function: 6CIT completed    03/09/2023   11:16 AM  MMSE - Mini Mental State Exam  Not completed: Unable to complete        03/14/2024   10:25 AM 03/09/2023   11:16 AM 02/24/2022   10:51 AM  02/12/2021    9:20 AM 02/06/2020    9:23 AM  6CIT Screen  What Year? 0 points 0 points 0 points 0 points 0 points  What month? 0 points 0 points 0 points 0 points 0 points  What time? 0 points 0 points 0 points 0 points 0 points  Count back from 20 0 points 0 points 0 points 0 points 0 points  Months in reverse 0 points 0 points 0 points 0 points 0 points  Repeat phrase 0 points 0 points 0 points 0 points 4 points  Total Score 0 points 0 points 0 points 0 points 4 points    Immunizations Immunization History  Administered Date(s) Administered   Fluad Quad(high Dose 65+) 05/18/2019, 06/20/2020, 05/29/2021, 07/07/2022   H1N1 10/16/2008   Influenza Whole 07/02/2007, 06/14/2009, 06/04/2010   Influenza,inj,Quad PF,6+ Mos 07/20/2013, 06/01/2014, 06/26/2015, 07/28/2016, 05/19/2017, 06/03/2018   Influenza-Unspecified 07/07/2023   MODERNA COVID-19 SARS-COV-2 PEDS BIVALENT BOOSTER 46yr-76yr 06/03/2023   Moderna SARS-COV2 Booster Vaccination 08/02/2020, 03/05/2021   Moderna Sars-Covid-2  Vaccination 10/01/2019, 10/31/2019   Pneumococcal Conjugate-13 09/28/2014   Pneumococcal Polysaccharide-23 04/20/2009   Td 05/01/2004   Td (Adult), 2 Lf Tetanus Toxid, Preservative Free 05/01/2004   Tdap 06/26/2023   Unspecified SARS-COV-2 Vaccination 07/07/2022   Zoster Recombinant(Shingrix) 04/22/2021, 12/31/2021   Zoster, Live 10/23/2006    Screening Tests Health Maintenance  Topic Date Due   COVID-19 Vaccine (5 - 2024-25 season) 07/29/2023   OPHTHALMOLOGY EXAM  01/12/2024   INFLUENZA VACCINE  04/22/2024   HEMOGLOBIN A1C  07/30/2024   DEXA SCAN  08/25/2024   Diabetic kidney evaluation - Urine ACR  08/27/2024   FOOT EXAM  08/29/2024   Diabetic kidney evaluation - eGFR measurement  02/21/2025   MAMMOGRAM  02/21/2025   Medicare Annual Wellness (AWV)  03/14/2025   DTaP/Tdap/Td (4 - Td or Tdap) 06/25/2033   Pneumococcal Vaccine: 50+ Years  Completed   Zoster Vaccines- Shingrix  Completed   HPV  VACCINES  Aged Out   Meningococcal B Vaccine  Aged Out   Hepatitis C Screening  Discontinued    Health Maintenance  Health Maintenance Due  Topic Date Due   COVID-19 Vaccine (5 - 2024-25 season) 07/29/2023   OPHTHALMOLOGY EXAM  01/12/2024   Health Maintenance Items Addressed: Last eye exam requested  Additional Screening:  Vision Screening: Recommended annual ophthalmology exams for early detection of glaucoma and other disorders of the eye. Would you like a referral to an eye doctor? No    Dental Screening: Recommended annual dental exams for proper oral hygiene  Community Resource Referral / Chronic Care Management: CRR required this visit?  No   CCM required this visit?  No   Plan:    I have personally reviewed and noted the following in the patient's chart:   Medical and social history Use of alcohol, tobacco or illicit drugs  Current medications and supplements including opioid prescriptions. Patient is not currently taking opioid prescriptions. Functional ability and status Nutritional status Physical activity Advanced directives List of other physicians Hospitalizations, surgeries, and ER visits in previous 12 months Vitals Screenings to include cognitive, depression, and falls Referrals and appointments  In addition, I have reviewed and discussed with patient certain preventive protocols, quality metrics, and best practice recommendations. A written personalized care plan for preventive services as well as general preventive health recommendations were provided to patient.   Lamika Connolly, CMA   03/14/2024   After Visit Summary: (MyChart) Due to this being a telephonic visit, the after visit summary with patients personalized plan was offered to patient via MyChart   Notes: Nothing significant to report at this time.

## 2024-03-14 NOTE — Patient Instructions (Signed)
 Jillian [name] ,  Thank you for taking time out of your busy schedule to complete your Annual Wellness Visit with me. I enjoyed our conversation and look forward to speaking with you again next year. I, as well as your care team,  appreciate your ongoing commitment to your health goals. Please review the following plan we discussed and let me know if I can assist you in the future.  I enjoyed our conversation and look forward to it again next year. Blessing for the upcoming year!!  -Nayden Czajka  Your Game plan/ To Do List    Referrals:  Follow up Visits: 1 Year Follow Up AWV: March 15, 2025 at 10:00 am in person   Next appointment with PCP: June 02, 2024 at 10:00 am in office  Clinician Recommendations:  Aim for 30 minutes of exercise or brisk walking, 6-8 glasses of water , and 5 servings of fruits and vegetables each day.       This is a list of the screening recommended for you and due dates:  Health Maintenance  Topic Date Due   COVID-19 Vaccine (5 - 2024-25 season) 07/29/2023   Eye exam for diabetics  01/12/2024   Flu Shot  04/22/2024   Hemoglobin A1C  07/30/2024   DEXA scan (bone density measurement)  08/25/2024   Yearly kidney health urinalysis for diabetes  08/27/2024   Complete foot exam   08/29/2024   Yearly kidney function blood test for diabetes  02/21/2025   Mammogram  02/21/2025   Medicare Annual Wellness Visit  03/14/2025   DTaP/Tdap/Td vaccine (4 - Td or Tdap) 06/25/2033   Pneumococcal Vaccine for age over 32  Completed   Zoster (Shingles) Vaccine  Completed   HPV Vaccine  Aged Out   Meningitis B Vaccine  Aged Out   Hepatitis C Screening  Discontinued    Advanced directives: (In Chart) A copy of your advanced directives are scanned into your chart should your provider ever need it. Advance Care Planning is important because it:  [x]  Makes sure you receive the medical care that is consistent with your values, goals, and preferences  [x]  It provides guidance to your  family and loved ones and reduces their decisional burden about whether or not they are making the right decisions based on your wishes.  Follow the link provided in your after visit summary or read over the paperwork we have mailed to you to help you started getting your Advance Directives in place. If you need assistance in completing these, please reach out to us  so that we can help you!  Understanding Your Risk for Falls Millions of people have serious injuries from falls each year. It is important to understand your risk of falling. Talk with your health care provider about your risk and what you can do to lower it. If you do have a serious fall, make sure to tell your provider. Falling once raises your risk of falling again. How can falls affect me? Serious injuries from falls are common. These include: Broken bones, such as hip fractures. Head injuries, such as traumatic brain injuries (TBI) or concussions. A fear of falling can cause you to avoid activities and stay at home. This can make your muscles weaker and raise your risk for a fall. What can increase my risk? There are a number of risk factors that increase your risk for falling. The more risk factors you have, the higher your risk of falling. Serious injuries from a fall happen most often to  people who are older than 82 years old. Teenagers and young adults ages 4-29 are also at higher risk. Common risk factors include: Weakness in the lower body. Being generally weak or confused due to long-term (chronic) illness. Dizziness or balance problems. Poor vision. Medicines that cause dizziness or drowsiness. These may include: Medicines for your blood pressure, heart, anxiety, insomnia, or swelling (edema). Pain medicines. Muscle relaxants. Other risk factors include: Drinking alcohol. Having had a fall in the past. Having foot pain or wearing improper footwear. Working at a dangerous job. Having any of the following in your  home: Tripping hazards, such as floor clutter or loose rugs. Poor lighting. Pets. Having dementia or memory loss. What actions can I take to lower my risk of falling?     Physical activity Stay physically fit. Do strength and balance exercises. Consider taking a regular class to build strength and balance. Yoga and tai chi are good options. Vision Have your eyes checked every year and your prescription for glasses or contacts updated as needed. Shoes and walking aids Wear non-skid shoes. Wear shoes that have rubber soles and low heels. Do not wear high heels. Do not walk around the house in socks or slippers. Use a cane or walker as told by your provider. Home safety Attach secure railings on both sides of your stairs. Install grab bars for your bathtub, shower, and toilet. Use a non-skid mat in your bathtub or shower. Attach bath mats securely with double-sided, non-slip rug tape. Use good lighting in all rooms. Keep a flashlight near your bed. Make sure there is a clear path from your bed to the bathroom. Use night-lights. Do not use throw rugs. Make sure all carpeting is taped or tacked down securely. Remove all clutter from walkways and stairways, including extension cords. Repair uneven or broken steps and floors. Avoid walking on icy or slippery surfaces. Walk on the grass instead of on icy or slick sidewalks. Use ice melter to get rid of ice on walkways in the winter. Use a cordless phone. Questions to ask your health care provider Can you help me check my risk for a fall? Do any of my medicines make me more likely to fall? Should I take a vitamin D  supplement? What exercises can I do to improve my strength and balance? Should I make an appointment to have my vision checked? Do I need a bone density test to check for weak bones (osteoporosis)? Would it help to use a cane or a walker? Where to find more information Centers for Disease Control and Prevention, STEADI:  TonerPromos.no Community-Based Fall Prevention Programs: TonerPromos.no [employer] on Aging: BaseRingTones.pl Contact a health care provider if: You fall at home. You are afraid of falling at home. You feel weak, drowsy, or dizzy. This information is not intended to replace advice given to you by your health care provider. Make sure you discuss any questions you have with your health care provider. Document Revised: 05/12/2022 Document Reviewed: 05/12/2022 Elsevier Patient Education  2024 [employer].

## 2024-03-14 NOTE — Telephone Encounter (Signed)
 Patient came to front window this morning saying she had seen Dr Cindie recently and she started having rectal bleeding on Saturday. Please call her at [phone] or her son's number is [phone]

## 2024-03-15 ENCOUNTER — Ambulatory Visit: Payer: Self-pay | Admitting: Family Medicine

## 2024-03-17 ENCOUNTER — Other Ambulatory Visit: Payer: Self-pay

## 2024-03-17 DIAGNOSIS — E1169 Type 2 diabetes mellitus with other specified complication: Secondary | ICD-10-CM

## 2024-04-12 DIAGNOSIS — M79671 Pain in right foot: Secondary | ICD-10-CM | POA: Diagnosis not present

## 2024-04-12 DIAGNOSIS — M79675 Pain in left toe(s): Secondary | ICD-10-CM | POA: Diagnosis not present

## 2024-04-12 DIAGNOSIS — M79672 Pain in left foot: Secondary | ICD-10-CM | POA: Diagnosis not present

## 2024-04-12 DIAGNOSIS — E1140 Type 2 diabetes mellitus with diabetic neuropathy, unspecified: Secondary | ICD-10-CM | POA: Diagnosis not present

## 2024-04-12 DIAGNOSIS — M79674 Pain in right toe(s): Secondary | ICD-10-CM | POA: Diagnosis not present

## 2024-04-12 DIAGNOSIS — I739 Peripheral vascular disease, unspecified: Secondary | ICD-10-CM | POA: Diagnosis not present

## 2024-04-22 DIAGNOSIS — R030 Elevated blood-pressure reading, without diagnosis of hypertension: Secondary | ICD-10-CM | POA: Diagnosis not present

## 2024-04-22 DIAGNOSIS — M25569 Pain in unspecified knee: Secondary | ICD-10-CM | POA: Diagnosis not present

## 2024-05-08 ENCOUNTER — Other Ambulatory Visit: Payer: Self-pay | Admitting: Family Medicine

## 2024-05-08 DIAGNOSIS — N390 Urinary tract infection, site not specified: Secondary | ICD-10-CM | POA: Diagnosis not present

## 2024-05-18 DIAGNOSIS — N390 Urinary tract infection, site not specified: Secondary | ICD-10-CM | POA: Diagnosis not present

## 2024-06-02 ENCOUNTER — Ambulatory Visit: Admitting: Family Medicine

## 2024-06-08 DIAGNOSIS — R809 Proteinuria, unspecified: Secondary | ICD-10-CM | POA: Diagnosis not present

## 2024-06-08 DIAGNOSIS — D631 Anemia in chronic kidney disease: Secondary | ICD-10-CM | POA: Diagnosis not present

## 2024-06-08 DIAGNOSIS — N189 Chronic kidney disease, unspecified: Secondary | ICD-10-CM | POA: Diagnosis not present

## 2024-06-08 DIAGNOSIS — E211 Secondary hyperparathyroidism, not elsewhere classified: Secondary | ICD-10-CM | POA: Diagnosis not present

## 2024-06-09 DIAGNOSIS — N1831 Chronic kidney disease, stage 3a: Secondary | ICD-10-CM | POA: Diagnosis not present

## 2024-06-09 DIAGNOSIS — E1122 Type 2 diabetes mellitus with diabetic chronic kidney disease: Secondary | ICD-10-CM | POA: Diagnosis not present

## 2024-06-09 DIAGNOSIS — I129 Hypertensive chronic kidney disease with stage 1 through stage 4 chronic kidney disease, or unspecified chronic kidney disease: Secondary | ICD-10-CM | POA: Diagnosis not present

## 2024-06-09 DIAGNOSIS — N2581 Secondary hyperparathyroidism of renal origin: Secondary | ICD-10-CM | POA: Diagnosis not present

## 2024-06-23 DIAGNOSIS — M79671 Pain in right foot: Secondary | ICD-10-CM | POA: Diagnosis not present

## 2024-06-23 DIAGNOSIS — M79674 Pain in right toe(s): Secondary | ICD-10-CM | POA: Diagnosis not present

## 2024-06-23 DIAGNOSIS — M79675 Pain in left toe(s): Secondary | ICD-10-CM | POA: Diagnosis not present

## 2024-06-23 DIAGNOSIS — M79672 Pain in left foot: Secondary | ICD-10-CM | POA: Diagnosis not present

## 2024-06-23 DIAGNOSIS — E1140 Type 2 diabetes mellitus with diabetic neuropathy, unspecified: Secondary | ICD-10-CM | POA: Diagnosis not present

## 2024-06-23 DIAGNOSIS — I739 Peripheral vascular disease, unspecified: Secondary | ICD-10-CM | POA: Diagnosis not present

## 2024-07-25 NOTE — Progress Notes (Signed)
 Jillian [name]                                          MRN: [id number]   07/25/2024   The [facility] Quality Team Specialist reviewed this patient medical record for the purposes of chart review for care gap closure. The following were reviewed: chart review for care gap closure-kidney health evaluation for diabetes:eGFR  and uACR.    VBCI Quality Team

## 2024-08-08 ENCOUNTER — Telehealth: Payer: Self-pay | Admitting: Family Medicine

## 2024-08-08 MED ORDER — GLIPIZIDE ER 2.5 MG PO TB24: 2.5000 mg | ORAL_TABLET | Freq: Every day | ORAL | 2 refills | Status: DC

## 2024-08-08 NOTE — Telephone Encounter (Signed)
 Copied from CRM #[id number]. Topic: Clinical - Medication Refill >> Aug 08, 2024 11:32 AM Olam RAMAN wrote: Medication: glipiZIDE  (GLUCOTROL  XL) 2.5 MG 24 hr tablet   Has the patient contacted their pharmacy? Yes (Agent: If no, request that the patient contact the pharmacy for the refill. If patient does not wish to contact the pharmacy document the reason why and proceed with request.) (Agent: If yes, when and what did the pharmacy advise?)  This is the patient's preferred pharmacy:  [id number] - [place], [place] - [address] AT [address] [place] [address] Phone: [phone] Fax: [phone]  [facility] - [place], [place] - [address] [place] [address] Phone: [phone] Fax: [phone]  [facility] [phone] - [place], [place] - [address] AT [facility] [address] [place] [address] Phone: [phone] Fax: [phone]  Is this the correct pharmacy for this prescription? Yes If no, delete pharmacy and type the correct one.   Has the prescription been filled recently? No  Is the patient out of the medication? No  Has the patient been seen for an appointment in the last year OR does the patient have an upcoming appointment? Yes  Can we respond through MyChart? No  Agent: Please be advised that Rx refills may take up to 3 business days. We ask that you follow-up with your pharmacy.

## 2024-08-08 NOTE — Telephone Encounter (Signed)
 Refilled and am requesting ov with me this weeks, needs labs and gaps in care

## 2024-08-09 ENCOUNTER — Other Ambulatory Visit: Payer: Self-pay

## 2024-08-09 ENCOUNTER — Telehealth: Payer: Self-pay

## 2024-08-09 DIAGNOSIS — E1169 Type 2 diabetes mellitus with other specified complication: Secondary | ICD-10-CM

## 2024-08-09 DIAGNOSIS — E785 Hyperlipidemia, unspecified: Secondary | ICD-10-CM

## 2024-08-09 DIAGNOSIS — I10 Essential (primary) hypertension: Secondary | ICD-10-CM

## 2024-08-09 NOTE — Telephone Encounter (Signed)
 Lvm to cb. Pt needs fasting labs done before her appt on Thursday if possible per dr simpson. Labs already ordered

## 2024-08-11 ENCOUNTER — Other Ambulatory Visit: Payer: Self-pay | Admitting: Family Medicine

## 2024-08-11 ENCOUNTER — Ambulatory Visit: Admitting: Family Medicine

## 2024-08-11 ENCOUNTER — Encounter: Payer: Self-pay | Admitting: Family Medicine

## 2024-08-11 VITALS — BP 140/84 | HR 69 | Resp 16 | Ht 64.0 in | Wt 196.0 lb

## 2024-08-11 DIAGNOSIS — E66811 Obesity, class 1: Secondary | ICD-10-CM

## 2024-08-11 DIAGNOSIS — E1169 Type 2 diabetes mellitus with other specified complication: Secondary | ICD-10-CM | POA: Diagnosis not present

## 2024-08-11 DIAGNOSIS — M8588 Other specified disorders of bone density and structure, other site: Secondary | ICD-10-CM

## 2024-08-11 DIAGNOSIS — I10 Essential (primary) hypertension: Secondary | ICD-10-CM

## 2024-08-11 DIAGNOSIS — M8580 Other specified disorders of bone density and structure, unspecified site: Secondary | ICD-10-CM | POA: Insufficient documentation

## 2024-08-11 DIAGNOSIS — R0981 Nasal congestion: Secondary | ICD-10-CM

## 2024-08-11 DIAGNOSIS — E785 Hyperlipidemia, unspecified: Secondary | ICD-10-CM | POA: Diagnosis not present

## 2024-08-11 DIAGNOSIS — E1159 Type 2 diabetes mellitus with other circulatory complications: Secondary | ICD-10-CM

## 2024-08-11 MED ORDER — FLUTICASONE PROPIONATE 50 MCG/ACT NA SUSP: 2.0000 | Freq: Every day | NASAL | 0 refills | Status: AC

## 2024-08-11 MED ORDER — BENAZEPRIL HCL 10 MG PO TABS: 10.0000 mg | ORAL_TABLET | Freq: Every day | ORAL | 3 refills | Status: DC

## 2024-08-11 NOTE — Assessment & Plan Note (Signed)
  Patient re-educated about  the importance of commitment to a  minimum of 150 minutes of exercise per week as able.  The importance of healthy food choices with portion control discussed, as well as eating regularly and within a 12 hour window most days. The need to choose clean , green food 50 to 75% of the time is discussed, as well as to make water  the primary drink and set a goal of 64 ounces water  daily.       08/11/2024    2:23 PM 03/14/2024   10:10 AM 03/02/2024    3:44 PM  Weight /BMI  Weight 196 lb 194 lb 1.3 oz 195 lb 1.6 oz  Height 5' 4 (1.626 m) 5' 4 (1.626 m) 5' 4 (1.626 m)  BMI 33.64 kg/m2 33.31 kg/m2 33.49 kg/m2    unchanged

## 2024-08-11 NOTE — Patient Instructions (Addendum)
 Annual exam with Dr Antonetta end January  Annual exam with Leita in 2027  Labs today ordered 11/18  Fluticasone  is prescribed for use in nostril for 1 month, if still symptomatic after that you will be referred to ENT, please let me know   Higher dose of benazepril  is 10 mg as blood pressure is high  Please schedule dexa at checkout  Thanks for choosing [facility], we consider it a privelige to serve you.

## 2024-08-11 NOTE — Assessment & Plan Note (Addendum)
 Uncontrolled, inc benazepril  to 10 mg daily DASH diet and commitment to daily physical activity for a minimum of 30 minutes discussed and encouraged, as a part of hypertension management. The importance of attaining a healthy weight is also discussed.     08/11/2024    2:48 PM 08/11/2024    2:47 PM 08/11/2024    2:23 PM 03/14/2024   10:10 AM 03/02/2024    3:44 PM 02/29/2024    9:49 AM 02/25/2024    3:24 PM  BP/Weight  Systolic BP 140 148 146 120 137 135 150  Diastolic BP 84 84 84 63 60 61 80  Wt. (Lbs)   196 194.08 195.1 195.77 196  BMI   33.64 kg/m2 33.31 kg/m2 33.49 kg/m2 33.6 kg/m2 33.64 kg/m2

## 2024-08-11 NOTE — Assessment & Plan Note (Signed)
 Hyperlipidemia:Low fat diet discussed and encouraged.   Lipid Panel  Lab Results  Component Value Date   CHOL 133 01/28/2024   HDL 42 01/28/2024   LDLCALC 73 01/28/2024   TRIG 95 01/28/2024   CHOLHDL 3.2 01/28/2024     Updated lab needed .

## 2024-08-11 NOTE — Assessment & Plan Note (Signed)
 Dexa to be scheduled

## 2024-08-11 NOTE — Assessment & Plan Note (Signed)
 Diabetes associated with hypertension, hyperlipidemia, obesity, and arthritis  Jillian [name] is reminded of the importance of commitment to daily physical activity for 30 minutes or more, as able and the need to limit carbohydrate intake to 30 to 60 grams per meal to help with blood sugar control.   The need to take medication as prescribed, test blood sugar as directed, and to call between visits if there is a concern that blood sugar is uncontrolled is also discussed.   Jillian [name] is reminded of the importance of daily foot exam, annual eye examination, and good blood sugar, blood pressure and cholesterol control.     Latest Ref Rng & Units 02/22/2024    8:54 AM 01/28/2024   11:15 AM 08/28/2023   11:25 AM 05/21/2023    8:35 AM 02/18/2023    9:55 AM  Diabetic Labs  HbA1c 4.8 - 5.6 %  7.0  7.0  7.1    Micro/Creat Ratio 0 - 29 mg/g creat   3     Chol 100 - 199 mg/dL  866   871    HDL >60 mg/dL  42   39    Calc LDL 0 - 99 mg/dL  73   71    Triglycerides 0 - 149 mg/dL  95   94    Creatinine 0.44 - 1.00 mg/dL 8.92  8.96  8.88   8.61       08/11/2024    2:48 PM 08/11/2024    2:47 PM 08/11/2024    2:23 PM 03/14/2024   10:10 AM 03/02/2024    3:44 PM 02/29/2024    9:49 AM 02/25/2024    3:24 PM  BP/Weight  Systolic BP 140 148 146 120 137 135 150  Diastolic BP 84 84 84 63 60 61 80  Wt. (Lbs)   196 194.08 195.1 195.77 196  BMI   33.64 kg/m2 33.31 kg/m2 33.49 kg/m2 33.6 kg/m2 33.64 kg/m2      Latest Ref Rng & Units 01/18/2024   12:00 AM 08/28/2023   10:20 AM  Foot/eye exam completion dates  Eye Exam No Retinopathy No Retinopathy       Foot Form Completion   Done     This result is from an external source.    Updated lab needed

## 2024-08-11 NOTE — Progress Notes (Signed)
 Jillian [name]     MRN: [id number]      DOB: Jan 30, 1941  Chief Complaint  Patient presents with   Medical Management of Chronic Issues    Follow up and med management     HPI Jillian [name] is here for follow up and re-evaluation of chronic medical conditions, medication management and review of any available recent lab and radiology data.  Preventive health is updated, specifically  Cancer screening and Immunization.   Questions or concerns regarding consultations or procedures which the PT has had in the interim are  addressed. The PT denies any adverse reactions to current medications since the last visit.  C/o lesion in left nostril x 1 month, no blood , no drainage, denies picking nostril FBG at times 140 Denies polyuria, polydipsia, blurred vision , or hypoglycemic episodes.   ROS Denies recent fever or chills. Denies sinus pressure, , ear pain or sore throat. Denies chest congestion, productive cough or wheezing. Denies chest pains, palpitations and leg swelling Denies abdominal pain, nausea, vomiting,diarrhea or constipation.   Denies dysuria, frequency, hesitancy or incontinence. Denies joint pain, swelling and limitation in mobility. Denies headaches, seizures, numbness, or tingling. Denies depression, anxiety or insomnia. Denies skin break down or rash.   PE  BP (!) 140/84   Pulse 69   Resp 16   Ht 5' 4 (1.626 m)   Wt 196 lb (88.9 kg)   SpO2 96%   BMI 33.64 kg/m   Patient alert and oriented and in no cardiopulmonary distress.  HEENT: No facial asymmetry, EOMI,     Neck supple .Nasal mucosa erythematous and swollen left nostril, no ulcer or mass noted in nostril, right nostril mildly erythematous no swelling noted  Chest: Clear to auscultation bilaterally.  CVS: S1, S2 no murmurs, no S3.Regular rate.  ABD: Soft non tender.   Ext: No edema  MS: Adequate ROM spine, shoulders, hips and knees.  Skin: Intact, no ulcerations or rash noted.  Psych: Good  eye contact, normal affect. Memory intact not anxious or depressed appearing.  CNS: CN 2-12 intact, power,  normal throughout.no focal deficits noted.   Assessment & Plan  Essential hypertension, benign Uncontrolled, inc benazepril  to 10 mg daily DASH diet and commitment to daily physical activity for a minimum of 30 minutes discussed and encouraged, as a part of hypertension management. The importance of attaining a healthy weight is also discussed.     08/11/2024    2:48 PM 08/11/2024    2:47 PM 08/11/2024    2:23 PM 03/14/2024   10:10 AM 03/02/2024    3:44 PM 02/29/2024    9:49 AM 02/25/2024    3:24 PM  BP/Weight  Systolic BP 140 148 146 120 137 135 150  Diastolic BP 84 84 84 63 60 61 80  Wt. (Lbs)   196 194.08 195.1 195.77 196  BMI   33.64 kg/m2 33.31 kg/m2 33.49 kg/m2 33.6 kg/m2 33.64 kg/m2       Hyperlipidemia with target LDL less than 100 Hyperlipidemia:Low fat diet discussed and encouraged.   Lipid Panel  Lab Results  Component Value Date   CHOL 133 01/28/2024   HDL 42 01/28/2024   LDLCALC 73 01/28/2024   TRIG 95 01/28/2024   CHOLHDL 3.2 01/28/2024     Updated lab needed .   Obesity (BMI 30.0-34.9)  Patient re-educated about  the importance of commitment to a  minimum of 150 minutes of exercise per week as able.  The importance of healthy  food choices with portion control discussed, as well as eating regularly and within a 12 hour window most days. The need to choose clean , green food 50 to 75% of the time is discussed, as well as to make water  the primary drink and set a goal of 64 ounces water  daily.       08/11/2024    2:23 PM 03/14/2024   10:10 AM 03/02/2024    3:44 PM  Weight /BMI  Weight 196 lb 194 lb 1.3 oz 195 lb 1.6 oz  Height 5' 4 (1.626 m) 5' 4 (1.626 m) 5' 4 (1.626 m)  BMI 33.64 kg/m2 33.31 kg/m2 33.49 kg/m2    unchanged  Type 2 diabetes mellitus with other specified complication (HCC) Diabetes associated with hypertension,  hyperlipidemia, obesity, and arthritis  Jillian [name] is reminded of the importance of commitment to daily physical activity for 30 minutes or more, as able and the need to limit carbohydrate intake to 30 to 60 grams per meal to help with blood sugar control.   The need to take medication as prescribed, test blood sugar as directed, and to call between visits if there is a concern that blood sugar is uncontrolled is also discussed.   Jillian [name] is reminded of the importance of daily foot exam, annual eye examination, and good blood sugar, blood pressure and cholesterol control.     Latest Ref Rng & Units 02/22/2024    8:54 AM 01/28/2024   11:15 AM 08/28/2023   11:25 AM 05/21/2023    8:35 AM 02/18/2023    9:55 AM  Diabetic Labs  HbA1c 4.8 - 5.6 %  7.0  7.0  7.1    Micro/Creat Ratio 0 - 29 mg/g creat   3     Chol 100 - 199 mg/dL  866   871    HDL >60 mg/dL  42   39    Calc LDL 0 - 99 mg/dL  73   71    Triglycerides 0 - 149 mg/dL  95   94    Creatinine 0.44 - 1.00 mg/dL 8.92  8.96  8.88   8.61       08/11/2024    2:48 PM 08/11/2024    2:47 PM 08/11/2024    2:23 PM 03/14/2024   10:10 AM 03/02/2024    3:44 PM 02/29/2024    9:49 AM 02/25/2024    3:24 PM  BP/Weight  Systolic BP 140 148 146 120 137 135 150  Diastolic BP 84 84 84 63 60 61 80  Wt. (Lbs)   196 194.08 195.1 195.77 196  BMI   33.64 kg/m2 33.31 kg/m2 33.49 kg/m2 33.6 kg/m2 33.64 kg/m2      Latest Ref Rng & Units 01/18/2024   12:00 AM 08/28/2023   10:20 AM  Foot/eye exam completion dates  Eye Exam No Retinopathy No Retinopathy       Foot Form Completion   Done     This result is from an external source.    Updated lab needed    Nasal congestion Flonase  topically x 1 month, if persists refer ENT

## 2024-08-11 NOTE — Assessment & Plan Note (Signed)
 Flonase topically x 1 month, if persists refer ENT

## 2024-08-12 ENCOUNTER — Ambulatory Visit: Payer: Self-pay | Admitting: Family Medicine

## 2024-08-12 LAB — CMP14+EGFR
ALT: 12 IU/L (ref 0–32)
AST: 17 IU/L (ref 0–40)
Albumin: 4.3 g/dL (ref 3.7–4.7)
Alkaline Phosphatase: 83 IU/L (ref 48–129)
BUN/Creatinine Ratio: 16 (ref 12–28)
BUN: 16 mg/dL (ref 8–27)
Bilirubin Total: 0.3 mg/dL (ref 0.0–1.2)
CO2: 26 mmol/L (ref 20–29)
Calcium: 9.5 mg/dL (ref 8.7–10.3)
Chloride: 105 mmol/L (ref 96–106)
Creatinine, Ser: 1.01 mg/dL — ABNORMAL HIGH (ref 0.57–1.00)
Globulin, Total: 2.7 g/dL (ref 1.5–4.5)
Glucose: 82 mg/dL (ref 70–99)
Potassium: 4.4 mmol/L (ref 3.5–5.2)
Sodium: 142 mmol/L (ref 134–144)
Total Protein: 7 g/dL (ref 6.0–8.5)
eGFR: 56 mL/min/1.73 — ABNORMAL LOW (ref >59)

## 2024-08-12 LAB — LIPID PANEL
Chol/HDL Ratio: 3.4 ratio (ref 0.0–4.4)
Cholesterol, Total: 148 mg/dL (ref 100–199)
HDL: 43 mg/dL (ref >39)
LDL Chol Calc (NIH): 81 mg/dL (ref 0–99)
Triglycerides: 134 mg/dL (ref 0–149)
VLDL Cholesterol Cal: 24 mg/dL (ref 5–40)

## 2024-08-12 LAB — HEMOGLOBIN A1C
Est. average glucose Bld gHb Est-mCnc: 151 mg/dL
Hgb A1c MFr Bld: 6.9 % — ABNORMAL HIGH (ref 4.8–5.6)

## 2024-09-12 ENCOUNTER — Other Ambulatory Visit: Payer: Self-pay | Admitting: Family Medicine

## 2024-09-12 MED ORDER — CLONIDINE HCL 0.1 MG PO TABS: 0.1000 mg | ORAL_TABLET | Freq: Every day | ORAL | 0 refills | Status: DC

## 2024-09-12 NOTE — Telephone Encounter (Signed)
 Copied from CRM #[id number]. Topic: Clinical - Medication Refill >> Sep 12, 2024  9:36 AM Victoria A wrote: Medication: cloNIDine  (CATAPRES ) 0.1 MG tablet  Has the patient contacted their pharmacy? No (Agent: If no, request that the patient contact the pharmacy for the refill. If patient does not wish to contact the pharmacy document the reason why and proceed with request.) (Agent: If yes, when and what did the pharmacy advise?)  This is the patient's preferred pharmacy:   [facility] - [place], [place] - [address] [place] [address] Phone: [phone] Fax: [phone]  Is this the correct pharmacy for this prescription? Yes If no, delete pharmacy and type the correct one.   Has the prescription been filled recently? No  Is the patient out of the medication? No 7 days left  Has the patient been seen for an appointment in the last year OR does the patient have an upcoming appointment? Yes  Can we respond through MyChart? Yes  Agent: Please be advised that Rx refills may take up to 3 business days. We ask that you follow-up with your pharmacy.

## 2024-09-16 ENCOUNTER — Telehealth: Payer: Self-pay | Admitting: Family Medicine

## 2024-09-16 ENCOUNTER — Other Ambulatory Visit: Payer: Self-pay

## 2024-09-16 MED ORDER — PRAVASTATIN SODIUM 10 MG PO TABS: 10.0000 mg | ORAL_TABLET | Freq: Every day | ORAL | 2 refills | Status: AC

## 2024-09-16 MED ORDER — BENAZEPRIL HCL 10 MG PO TABS: 10.0000 mg | ORAL_TABLET | Freq: Every day | ORAL | 3 refills | Status: AC

## 2024-09-16 NOTE — Telephone Encounter (Signed)
 Sent to [facility]

## 2024-09-16 NOTE — Telephone Encounter (Signed)
 Refill benazepri 10mg   Pravastatin  10 mg  Patient states she ususally has them delivered by mail

## 2024-09-19 ENCOUNTER — Other Ambulatory Visit: Payer: Self-pay | Admitting: Family Medicine

## 2024-09-19 MED ORDER — GLIPIZIDE ER 2.5 MG PO TB24: 2.5000 mg | ORAL_TABLET | Freq: Every day | ORAL | 2 refills | Status: AC

## 2024-09-19 NOTE — Telephone Encounter (Signed)
 Copied from CRM [id number]. Topic: Clinical - Medication Refill >> Sep 19, 2024 11:21 AM Wess RAMAN wrote: Medication: glipiZIDE  (GLUCOTROL  XL) 2.5 MG 24 hr tablet   Has the patient contacted their pharmacy? No (Agent: If no, request that the patient contact the pharmacy for the refill. If patient does not wish to contact the pharmacy document the reason why and proceed with request.) (Agent: If yes, when and what did the pharmacy advise?)  This is the patient's preferred pharmacy:   [facility] - [place], [place] - [address] [place] [address] Phone: [phone] Fax: [phone]   Is this the correct pharmacy for this prescription? Yes If no, delete pharmacy and type the correct one.   Has the prescription been filled recently? Yes  Is the patient out of the medication? No  Has the patient been seen for an appointment in the last year OR does the patient have an upcoming appointment? Yes  Can we respond through MyChart? Yes  Agent: Please be advised that Rx refills may take up to 3 business days. We ask that you follow-up with your pharmacy.

## 2024-10-19 ENCOUNTER — Ambulatory Visit: Payer: Self-pay

## 2024-10-23 ENCOUNTER — Other Ambulatory Visit: Payer: Self-pay | Admitting: Family Medicine

## 2024-11-04 ENCOUNTER — Encounter: Admitting: Family Medicine

## 2024-11-10 ENCOUNTER — Ambulatory Visit: Payer: Self-pay | Admitting: Family Medicine

## 2024-12-14 ENCOUNTER — Encounter: Admitting: Family Medicine

## 2025-02-23 ENCOUNTER — Other Ambulatory Visit

## 2025-02-23 ENCOUNTER — Ambulatory Visit (HOSPITAL_COMMUNITY)

## 2025-03-03 ENCOUNTER — Other Ambulatory Visit

## 2025-03-06 ENCOUNTER — Ambulatory Visit: Admitting: Physician Assistant

## 2025-03-13 ENCOUNTER — Ambulatory Visit: Admitting: Physician Assistant

## 2025-03-15 ENCOUNTER — Ambulatory Visit
